# Patient Record
Sex: Male | Born: 1960 | Race: White | Hispanic: No | Marital: Single | Smoking: Former smoker
Health system: Southern US, Community
[De-identification: ages and names within clinical notes are randomized; demographics above are authoritative.]

## PROBLEM LIST (undated history)

## (undated) DIAGNOSIS — F32A Depression, unspecified: Secondary | ICD-10-CM

## (undated) DIAGNOSIS — K922 Gastrointestinal hemorrhage, unspecified: Secondary | ICD-10-CM

## (undated) DIAGNOSIS — Z9114 Patient's other noncompliance with medication regimen: Secondary | ICD-10-CM

## (undated) DIAGNOSIS — Z860100 Personal history of colon polyps, unspecified: Secondary | ICD-10-CM

## (undated) DIAGNOSIS — F319 Bipolar disorder, unspecified: Secondary | ICD-10-CM

## (undated) DIAGNOSIS — E871 Hypo-osmolality and hyponatremia: Secondary | ICD-10-CM

## (undated) DIAGNOSIS — K219 Gastro-esophageal reflux disease without esophagitis: Secondary | ICD-10-CM

## (undated) DIAGNOSIS — K2970 Gastritis, unspecified, without bleeding: Secondary | ICD-10-CM

## (undated) DIAGNOSIS — E039 Hypothyroidism, unspecified: Secondary | ICD-10-CM

## (undated) DIAGNOSIS — J45909 Unspecified asthma, uncomplicated: Secondary | ICD-10-CM

## (undated) DIAGNOSIS — Z8612 Personal history of poliomyelitis: Secondary | ICD-10-CM

## (undated) DIAGNOSIS — E785 Hyperlipidemia, unspecified: Secondary | ICD-10-CM

## (undated) DIAGNOSIS — F209 Schizophrenia, unspecified: Secondary | ICD-10-CM

## (undated) DIAGNOSIS — I959 Hypotension, unspecified: Secondary | ICD-10-CM

## (undated) DIAGNOSIS — I10 Essential (primary) hypertension: Secondary | ICD-10-CM

## (undated) DIAGNOSIS — K589 Irritable bowel syndrome without diarrhea: Secondary | ICD-10-CM

## (undated) DIAGNOSIS — N179 Acute kidney failure, unspecified: Secondary | ICD-10-CM

## (undated) DIAGNOSIS — F329 Major depressive disorder, single episode, unspecified: Secondary | ICD-10-CM

## (undated) DIAGNOSIS — E876 Hypokalemia: Secondary | ICD-10-CM

## (undated) DIAGNOSIS — J449 Chronic obstructive pulmonary disease, unspecified: Secondary | ICD-10-CM

## (undated) HISTORY — PX: BACK SURGERY: SHX140

## (undated) HISTORY — DX: Personal history of colon polyps, unspecified: Z86.0100

## (undated) HISTORY — DX: Personal history of colonic polyps: Z86.010

## (undated) HISTORY — PX: CARPAL TUNNEL RELEASE: SHX101

## (undated) HISTORY — PX: ROTATOR CUFF REPAIR: SHX139

## (undated) HISTORY — PX: CHOLECYSTECTOMY: SHX55

---

## 2014-06-03 ENCOUNTER — Emergency Department: Payer: Self-pay | Admitting: Emergency Medicine

## 2014-08-11 ENCOUNTER — Emergency Department: Admit: 2014-08-11 | Disposition: A | Discharge status: Home / Self Care | Payer: Self-pay | Admitting: Student

## 2014-09-16 ENCOUNTER — Emergency Department
Admission: EM | Admit: 2014-09-16 | Discharge: 2014-09-17 | Disposition: A | Discharge status: Home / Self Care | Payer: Medicaid Other | Attending: Student | Admitting: Student

## 2014-09-16 ENCOUNTER — Encounter: Payer: Self-pay | Admitting: General Practice

## 2014-09-16 DIAGNOSIS — Z720 Tobacco use: Secondary | ICD-10-CM | POA: Diagnosis not present

## 2014-09-16 DIAGNOSIS — Z91148 Patient's other noncompliance with medication regimen for other reason: Secondary | ICD-10-CM

## 2014-09-16 DIAGNOSIS — I10 Essential (primary) hypertension: Secondary | ICD-10-CM | POA: Diagnosis not present

## 2014-09-16 DIAGNOSIS — Z9114 Patient's other noncompliance with medication regimen: Secondary | ICD-10-CM

## 2014-09-16 DIAGNOSIS — J45909 Unspecified asthma, uncomplicated: Secondary | ICD-10-CM

## 2014-09-16 DIAGNOSIS — F329 Major depressive disorder, single episode, unspecified: Secondary | ICD-10-CM | POA: Diagnosis present

## 2014-09-16 DIAGNOSIS — G2401 Drug induced subacute dyskinesia: Secondary | ICD-10-CM

## 2014-09-16 DIAGNOSIS — F23 Brief psychotic disorder: Secondary | ICD-10-CM | POA: Insufficient documentation

## 2014-09-16 DIAGNOSIS — Z79899 Other long term (current) drug therapy: Secondary | ICD-10-CM | POA: Insufficient documentation

## 2014-09-16 HISTORY — DX: Hypothyroidism, unspecified: E03.9

## 2014-09-16 HISTORY — DX: Essential (primary) hypertension: I10

## 2014-09-16 HISTORY — DX: Depression, unspecified: F32.A

## 2014-09-16 HISTORY — DX: Major depressive disorder, single episode, unspecified: F32.9

## 2014-09-16 HISTORY — DX: Chronic obstructive pulmonary disease, unspecified: J44.9

## 2014-09-16 HISTORY — DX: Patient's other noncompliance with medication regimen: Z91.14

## 2014-09-16 HISTORY — DX: Patient's other noncompliance with medication regimen for other reason: Z91.148

## 2014-09-16 LAB — URINALYSIS COMPLETE WITH MICROSCOPIC (ARMC ONLY)
BILIRUBIN URINE: NEGATIVE
GLUCOSE, UA: NEGATIVE mg/dL
Hgb urine dipstick: NEGATIVE
LEUKOCYTES UA: NEGATIVE
NITRITE: NEGATIVE
PH: 5 (ref 5.0–8.0)
Protein, ur: 30 mg/dL — AB
Specific Gravity, Urine: 1.024 (ref 1.005–1.030)

## 2014-09-16 LAB — URINE DRUG SCREEN, QUALITATIVE (ARMC ONLY)
Amphetamines, Ur Screen: NOT DETECTED
BARBITURATES, UR SCREEN: NOT DETECTED
Benzodiazepine, Ur Scrn: POSITIVE — AB
Cannabinoid 50 Ng, Ur ~~LOC~~: POSITIVE — AB
Cocaine Metabolite,Ur ~~LOC~~: NOT DETECTED
MDMA (Ecstasy)Ur Screen: NOT DETECTED
Methadone Scn, Ur: NOT DETECTED
OPIATE, UR SCREEN: NOT DETECTED
Phencyclidine (PCP) Ur S: NOT DETECTED
Tricyclic, Ur Screen: POSITIVE — AB

## 2014-09-16 LAB — CBC
HCT: 48 % (ref 40.0–52.0)
Hemoglobin: 15.9 g/dL (ref 13.0–18.0)
MCH: 30.6 pg (ref 26.0–34.0)
MCHC: 33 g/dL (ref 32.0–36.0)
MCV: 92.6 fL (ref 80.0–100.0)
Platelets: 239 10*3/uL (ref 150–440)
RBC: 5.18 MIL/uL (ref 4.40–5.90)
RDW: 14.3 % (ref 11.5–14.5)
WBC: 13.2 10*3/uL — AB (ref 3.8–10.6)

## 2014-09-16 LAB — COMPREHENSIVE METABOLIC PANEL
ALBUMIN: 4.6 g/dL (ref 3.5–5.0)
ALT: 33 U/L (ref 17–63)
ANION GAP: 10 (ref 5–15)
AST: 43 U/L — ABNORMAL HIGH (ref 15–41)
Alkaline Phosphatase: 97 U/L (ref 38–126)
BUN: 23 mg/dL — ABNORMAL HIGH (ref 6–20)
CO2: 24 mmol/L (ref 22–32)
CREATININE: 1.11 mg/dL (ref 0.61–1.24)
Calcium: 9.7 mg/dL (ref 8.9–10.3)
Chloride: 105 mmol/L (ref 101–111)
GFR calc Af Amer: >60 mL/min (ref >60)
GFR calc non Af Amer: >60 mL/min (ref >60)
GLUCOSE: 109 mg/dL — AB (ref 65–99)
POTASSIUM: 3.9 mmol/L (ref 3.5–5.1)
SODIUM: 139 mmol/L (ref 135–145)
Total Bilirubin: 0.5 mg/dL (ref 0.3–1.2)
Total Protein: 8.1 g/dL (ref 6.5–8.1)

## 2014-09-16 LAB — ACETAMINOPHEN LEVEL

## 2014-09-16 LAB — SALICYLATE LEVEL

## 2014-09-16 LAB — ETHANOL: Alcohol, Ethyl (B): <5 mg/dL (ref <5)

## 2014-09-16 MED ORDER — QUETIAPINE FUMARATE 200 MG PO TABS
ORAL_TABLET | ORAL | Status: AC
  Filled 2014-09-16: qty 2

## 2014-09-16 MED ORDER — LEVOTHYROXINE SODIUM 75 MCG PO TABS
75.0000 ug | ORAL_TABLET | Freq: Every day | ORAL | Status: DC
  Filled 2014-09-16 (×2): qty 1

## 2014-09-16 MED ORDER — ALBUTEROL SULFATE (2.5 MG/3ML) 0.083% IN NEBU: 2.5000 mg | INHALATION_SOLUTION | RESPIRATORY_TRACT | Status: DC | PRN

## 2014-09-16 MED ORDER — LISINOPRIL 20 MG PO TABS
30.0000 mg | ORAL_TABLET | Freq: Every day | ORAL | Status: DC
  Administered 2014-09-16: 30 mg via ORAL

## 2014-09-16 MED ORDER — ALBUTEROL SULFATE HFA 108 (90 BASE) MCG/ACT IN AERS: 2.0000 | INHALATION_SPRAY | RESPIRATORY_TRACT | Status: DC | PRN

## 2014-09-16 MED ORDER — HYOSCYAMINE SULFATE 0.125 MG PO TABS
0.1250 mg | ORAL_TABLET | Freq: Four times a day (QID) | ORAL | Status: DC | PRN
  Filled 2014-09-16: qty 1

## 2014-09-16 MED ORDER — PANTOPRAZOLE SODIUM 40 MG PO TBEC
40.0000 mg | DELAYED_RELEASE_TABLET | Freq: Every day | ORAL | Status: DC
  Administered 2014-09-16: 40 mg via ORAL

## 2014-09-16 MED ORDER — ZIPRASIDONE MESYLATE 20 MG IM SOLR
INTRAMUSCULAR | Status: AC
  Administered 2014-09-16: 20 mg via INTRAMUSCULAR
  Filled 2014-09-16: qty 20

## 2014-09-16 MED ORDER — ZOLPIDEM TARTRATE 5 MG PO TABS: 5.0000 mg | ORAL_TABLET | Freq: Every evening | ORAL | Status: DC | PRN

## 2014-09-16 MED ORDER — AMITRIPTYLINE HCL 50 MG PO TABS
50.0000 mg | ORAL_TABLET | Freq: Every day | ORAL | Status: DC
  Administered 2014-09-16: 50 mg via ORAL
  Filled 2014-09-16 (×2): qty 1

## 2014-09-16 MED ORDER — LORAZEPAM 2 MG/ML IJ SOLN
2.0000 mg | Freq: Once | INTRAMUSCULAR | Status: AC
  Administered 2014-09-16: 2 mg via INTRAMUSCULAR

## 2014-09-16 MED ORDER — LORAZEPAM 2 MG/ML IJ SOLN
INTRAMUSCULAR | Status: AC
  Administered 2014-09-16: 2 mg via INTRAMUSCULAR
  Filled 2014-09-16: qty 1

## 2014-09-16 MED ORDER — DOCUSATE SODIUM 100 MG PO CAPS
ORAL_CAPSULE | ORAL | Status: AC
  Administered 2014-09-16: 100 mg via ORAL
  Filled 2014-09-16: qty 1

## 2014-09-16 MED ORDER — ZIPRASIDONE MESYLATE 20 MG IM SOLR
20.0000 mg | Freq: Once | INTRAMUSCULAR | Status: AC
  Administered 2014-09-16: 20 mg via INTRAMUSCULAR

## 2014-09-16 MED ORDER — QUETIAPINE FUMARATE 25 MG PO TABS
300.0000 mg | ORAL_TABLET | Freq: Every day | ORAL | Status: DC
  Administered 2014-09-16: 300 mg via ORAL

## 2014-09-16 MED ORDER — PANTOPRAZOLE SODIUM 20 MG PO TBEC: 20.0000 mg | DELAYED_RELEASE_TABLET | Freq: Two times a day (BID) | ORAL | Status: DC

## 2014-09-16 MED ORDER — DOCUSATE SODIUM 100 MG PO CAPS
100.0000 mg | ORAL_CAPSULE | Freq: Two times a day (BID) | ORAL | Status: DC
  Administered 2014-09-16: 100 mg via ORAL

## 2014-09-16 MED ORDER — VENLAFAXINE HCL 25 MG PO TABS
50.0000 mg | ORAL_TABLET | Freq: Two times a day (BID) | ORAL | Status: DC
  Administered 2014-09-16: 50 mg via ORAL
  Filled 2014-09-16 (×4): qty 2

## 2014-09-16 MED ORDER — LISINOPRIL 20 MG PO TABS
ORAL_TABLET | ORAL | Status: AC
  Administered 2014-09-16: 30 mg via ORAL
  Filled 2014-09-16: qty 2

## 2014-09-16 MED ORDER — PANTOPRAZOLE SODIUM 40 MG PO TBEC
DELAYED_RELEASE_TABLET | ORAL | Status: AC
  Administered 2014-09-16: 40 mg via ORAL
  Filled 2014-09-16: qty 1

## 2014-09-16 NOTE — ED Notes (Signed)
Dietary notified about food tray needed for pt.

## 2014-09-16 NOTE — ED Notes (Signed)
BEHAVIORAL HEALTH ROUNDING Patient sleeping: Yes.   Patient alert and oriented: Pt sleeping. Behavior appropriate: Yes.  ; If no, describe:  Nutrition and fluids offered: Yes  Toileting and hygiene offered: Yes  Sitter present: not applicable Law enforcement present: Yes

## 2014-09-16 NOTE — ED Notes (Signed)
Pharm. Notified about medications needed for pt.

## 2014-09-16 NOTE — ED Notes (Signed)
BEHAVIORAL HEALTH ROUNDING Patient sleeping: No. Patient alert and oriented: no Behavior appropriate: No.; If no, describe:  Nutrition and fluids offered: Yes  Toileting and hygiene offered: Yes  Sitter present: yes Law enforcement present: Yes [employer] Garment/textile technologist

## 2014-09-16 NOTE — ED Notes (Signed)
BEHAVIORAL HEALTH ROUNDING Patient sleeping: Yes.   Patient alert and oriented: yes Behavior appropriate: Yes.  ; If no, describe:  Nutrition and fluids offered: Yes  Toileting and hygiene offered: Yes  Sitter present: yes Law enforcement present: Yes [employer] Garment/textile technologist

## 2014-09-16 NOTE — ED Notes (Signed)
BEHAVIORAL HEALTH ROUNDING Patient sleeping: Yes.   Patient alert and oriented: yes Behavior appropriate: Yes.  ; If no, describe:  Nutrition and fluids offered: Yes  Toileting and hygiene offered: Yes  Sitter present: no Law enforcement present: Yes  

## 2014-09-16 NOTE — ED Notes (Signed)
ENVIRONMENTAL ASSESSMENT Potentially harmful objects out of patient reach: Yes.   Personal belongings secured: Yes.   Patient dressed in hospital provided attire only: Yes.   Plastic bags out of patient reach: Yes.   Patient care equipment (cords, cables, call bells, lines, and drains) shortened, removed, or accounted for: Yes.   Equipment and supplies removed from bottom of stretcher: Yes.   Potentially toxic materials out of patient reach: Yes.   Sharps container removed or out of patient reach: Yes.  ODS officer

## 2014-09-16 NOTE — ED Notes (Signed)
BEHAVIORAL HEALTH ROUNDING Patient sleeping: No. Patient alert and oriented: yes Behavior appropriate: Yes.  ; If no, describe:  Nutrition and fluids offered: Yes  Toileting and hygiene offered: Yes  Sitter present: yes Law enforcement present: Yes [employer] security 

## 2014-09-16 NOTE — ED Notes (Signed)

## 2014-09-16 NOTE — ED Notes (Signed)
BEHAVIORAL HEALTH ROUNDING Patient sleeping: Yes  Patient alert and oriented: Pt sleeping  Behavior appropriate: Pt sleeping  Nutrition and fluids offered Pt sleeping  Toileting and hygiene offered: Pt sleeping  Sitter present: 15 min checks  Law enforcement present: Yes

## 2014-09-16 NOTE — ED Notes (Signed)
Patient assigned to appropriate care area. Patient oriented to unit/care area: Informed that, for their safety, care areas are designed for safety and monitored by security cameras at all times; and visiting hours explained to patient. Patient verbalizes understanding, and verbal contract for safety obtained. 
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
## 2014-09-16 NOTE — ED Notes (Signed)
MD at bedside, DR. Clapacs

## 2014-09-16 NOTE — Consult Note (Signed)
  Due to overcrowding in the ER patient is reconsidered for admission to [facility]. PAtient's baseline is ambulatory and as of my last talk with him he was not threatening to anyone but would only get upset at the idea of returning to his group home. PSychotic and had been off medication but now cooperating with meds. Orders done.

## 2014-09-16 NOTE — ED Notes (Signed)
BEHAVIORAL HEALTH ROUNDING Patient sleeping: No. Patient alert and oriented: yes Behavior appropriate: No.; If no, describe: Pt in hallway in recliner speaking loudly, yelling he will not return to assisted living.  Nutrition and fluids offered: Yes  Toileting and hygiene offered: Yes  Sitter present: no Law enforcement present: Yes

## 2014-09-16 NOTE — ED Notes (Signed)
Pt. Arrived to ed via [employer]. Reports that pt was brought from his group home, [facility] Reports pt became agitated at group. Pt verbalized depression in triage. Denies SI and HI. PT reports "I just want my freedom back, i want my own home and I don' t want to live there anymore".  Pt alert and oriented.  Pt verbalized that he has not been taking his medications daily.

## 2014-09-16 NOTE — ED Notes (Signed)
BEHAVIORAL HEALTH ROUNDING Patient sleeping: Yes.   Patient alert and oriented: yes Behavior appropriate: Yes.  ; If no, describe:  Nutrition and fluids offered: Yes  Toileting and hygiene offered: Yes  Sitter present: yes Law enforcement present: Yes [employer] security 

## 2014-09-16 NOTE — ED Notes (Signed)
Pt increasingly loud, aggressive and angry yelling about not wanting to return to assisted living. Attempts to calm patient made. Moved into room for comfort. Pt continued loud behavior. Dr Reita Cliche aware.

## 2014-09-16 NOTE — ED Notes (Signed)
BEHAVIORAL HEALTH ROUNDING Patient sleeping: Yes.   Patient alert and oriented: not applicable Behavior appropriate: Yes.   Nutrition and fluids offered: pt sleeping  Toileting and hygiene offered: pt sleeping  Sitter present: 15 min checks  Law enforcement present: Yes

## 2014-09-16 NOTE — ED Provider Notes (Addendum)
[facility] Emergency Department Provider Note   ____________________________________________  Time seen: 9:50 AM I have reviewed the triage vital signs and the triage nursing note.  HISTORY  Chief Complaint Depression   Historian Limited as patient appears to be in acute schizophrenic and depressed state. History is per the patient, and voluntary commitment paperwork, and care home staff  HPI Christopher Mason is a 54 y.o. male who was brought in under involuntary commitment with the petitioner stating that he's been refusing his psychiatric medications for schizophrenia and been having behavioral changes including severe agitation including aggressive behavior with yelling and spitting at staff. The patient himself states that he is "done with assisted living" and is unable to provide any additional reasons as to why he is so agitated. He has not appeared to be hallucinating although he is perseverating and severely agitated. Patient reports "I want my Freedom back and live with my dog ". Symptoms are moderate. Patient is not any pain. Does not report any recent illnesses.    Past Medical History  Diagnosis Date  . Depression   . Hypertension   . Hypothyroid   . COPD (chronic obstructive pulmonary disease)     There are no active problems to display for this patient.   Past Surgical History  Procedure Laterality Date  . Back surgery      Current Outpatient Rx  Name  Route  Sig  Dispense  Refill  . albuterol (PROVENTIL HFA;VENTOLIN HFA) 108 (90 BASE) MCG/ACT inhaler   Inhalation   Inhale 2 puffs into the lungs every 4 (four) hours as needed for wheezing or shortness of breath.         Marland Kitchen amitriptyline (ELAVIL) 50 MG tablet   Oral   Take 50 mg by mouth at bedtime.         . docusate sodium (COLACE) 100 MG capsule   Oral   Take 100 mg by mouth daily.         . hydrOXYzine (VISTARIL) 25 MG capsule   Oral   Take 25 mg by mouth every 4 (four)  hours as needed for anxiety.         . hyoscyamine (LEVSIN, ANASPAZ) 0.125 MG tablet   Oral   Take 0.125 mg by mouth every 4 (four) hours as needed for bladder spasms.         Marland Kitchen ibuprofen (ADVIL,MOTRIN) 800 MG tablet   Oral   Take 800 mg by mouth every 4 (four) hours as needed for mild pain.         Marland Kitchen levothyroxine (SYNTHROID, LEVOTHROID) 75 MCG tablet   Oral   Take 75 mcg by mouth daily.         Marland Kitchen lisinopril (PRINIVIL,ZESTRIL) 30 MG tablet   Oral   Take 30 mg by mouth daily.         Marland Kitchen omeprazole (PRILOSEC) 20 MG capsule   Oral   Take 20 mg by mouth daily.         . pravastatin (PRAVACHOL) 10 MG tablet   Oral   Take 10 mg by mouth at bedtime.         Marland Kitchen QUEtiapine (SEROQUEL) 300 MG tablet   Oral   Take 300 mg by mouth at bedtime.         Marland Kitchen venlafaxine (EFFEXOR) 50 MG tablet   Oral   Take 50 mg by mouth 2 (two) times daily.         Marland Kitchen zolpidem (  AMBIEN) 5 MG tablet   Oral   Take 5 mg by mouth at bedtime.           Allergies Aspirin  No family history on file.  Social History History  Substance Use Topics  . Smoking status: Current Every Day Smoker -- 1.00 packs/day  . Smokeless tobacco: Never Used  . Alcohol Use: No    Review of Systems Limited due to patient's agitated/psychiatric acute state Constitutional: Negative for fever. Eyes: Negative for visual changes. ENT: Negative for sore throat. Cardiovascular: Negative for chest pain. Respiratory: Negative for shortness of breath. Gastrointestinal: Negative for abdominal pain, vomiting and diarrhea. Genitourinary: Negative for dysuria. Musculoskeletal: Negative for back pain. Skin: Negative for rash. Neurological: Negative for headaches, focal weakness or numbness.  ____________________________________________   PHYSICAL EXAM:  VITAL SIGNS: ED Triage Vitals  Enc Vitals Group     BP 09/16/14 0903 141/102 mmHg     Pulse Rate 09/16/14 0903 41     Resp 09/16/14 0903 18     Temp  09/16/14 0903 97.6 F (36.4 C)     Temp src --      SpO2 09/16/14 0903 96 %     Weight 09/16/14 0903 150 lb (68.04 kg)     Height 09/16/14 0903 5\' 7"  (1.702 m)     Head Cir --      Peak Flow --      Pain Score 09/16/14 0904 9     Pain Loc --      Pain Edu? --      Excl. in [place]? --      Constitutional: Alert, agitated but somewhat redirectable. Disheveled but otherwise in no acute distress. Eyes: Conjunctivae are normal. PERRL. Normal extraocular movements. ENT   Head: Normocephalic and atraumatic.   Nose: No congestion/rhinnorhea.   Mouth/Throat: Mucous membranes are moist.   Neck: No stridor. Cardiovascular: Normal rate, regular rhythm.  No murmurs, rubs, or gallops. Respiratory: Normal respiratory effort without tachypnea nor retractions. Breath sounds are clear and equal bilaterally. No wheezes/rales/rhonchi. Gastrointestinal: Soft and nontender. No distention.  Genitourinary: Musculoskeletal: Nontender with normal range of motion in all extremities. No joint effusions.  No lower extremity tenderness nor edema. Neurologic:  Normal speech and language. No gross focal neurologic deficits are appreciated. Skin:  Skin is warm, dry and intact. No rash noted. Psychiatric: Patient reports depressed mood, he is agitated and is perseverating over leaving the assisted living. Stating "if I go back I will keep running away ". There is no suicidal or homicidal ideation. Poor insight or judgment  ____________________________________________   EKG   ____________________________________________  LABS (pertinent positives/negatives)  Tylenol level negative CBC showing a white blood cell count 33.8 Complete metabolic panel without significant abnormality Alcohol negative Salicylate negative  UA and UDS are pending  ____________________________________________  RADIOLOGY Radiologist results  reviewed   __________________________________________  PROCEDURES  Procedure(s) performed: None Critical Care performed: None  ____________________________________________   ED COURSE / ASSESSMENT AND PLAN  Pertinent labs & imaging results that were available during my care of the patient were reviewed by me and considered in my medical decision making (see chart for details).  I will continue the involuntary commitment so the patient can be monitored due to his history of schizophrenia, acute agitated state, and history of being off of his medications for several days with aggressive behavior towards staff at the living facility. At the present he is stopping some depression but no active suicidal ideation. He is redirectable  but does become quite loud and agitated at times. There is no report or clinical findings to suggest any traumatic injury, or any specific other medical illness including to his current mental state.  Patient became increasingly agitated here in the emergency department yelling, threatening to run, and posturing. The patient was moved into a choir room and this continued. Patient was given intramuscular Geodon and Ativan.  TTS and psychiatry consulted for disposition.   ___________________________________________   FINAL CLINICAL IMPRESSION(S) / ED DIAGNOSES   Final diagnoses:  Schizophrenia, acute      Lisa Roca, MD 09/16/14 1145  Lisa Roca, MD 09/16/14 1224

## 2014-09-17 ENCOUNTER — Inpatient Hospital Stay
Admission: EM | Admit: 2014-09-17 | Discharge: 2014-09-29 | DRG: 885 | Disposition: A | Discharge status: Home / Self Care | Payer: Medicaid Other | Source: Ambulatory Visit | Attending: Psychiatry | Admitting: Psychiatry

## 2014-09-17 DIAGNOSIS — Z888 Allergy status to other drugs, medicaments and biological substances status: Secondary | ICD-10-CM | POA: Diagnosis not present

## 2014-09-17 DIAGNOSIS — G4700 Insomnia, unspecified: Secondary | ICD-10-CM | POA: Diagnosis present

## 2014-09-17 DIAGNOSIS — Z79899 Other long term (current) drug therapy: Secondary | ICD-10-CM | POA: Diagnosis not present

## 2014-09-17 DIAGNOSIS — J45909 Unspecified asthma, uncomplicated: Secondary | ICD-10-CM | POA: Diagnosis present

## 2014-09-17 DIAGNOSIS — K219 Gastro-esophageal reflux disease without esophagitis: Secondary | ICD-10-CM | POA: Diagnosis present

## 2014-09-17 DIAGNOSIS — Z9114 Patient's other noncompliance with medication regimen: Secondary | ICD-10-CM | POA: Diagnosis present

## 2014-09-17 DIAGNOSIS — G2401 Drug induced subacute dyskinesia: Secondary | ICD-10-CM | POA: Diagnosis present

## 2014-09-17 DIAGNOSIS — F17210 Nicotine dependence, cigarettes, uncomplicated: Secondary | ICD-10-CM | POA: Diagnosis present

## 2014-09-17 DIAGNOSIS — J449 Chronic obstructive pulmonary disease, unspecified: Secondary | ICD-10-CM | POA: Diagnosis present

## 2014-09-17 DIAGNOSIS — Z7951 Long term (current) use of inhaled steroids: Secondary | ICD-10-CM | POA: Diagnosis not present

## 2014-09-17 DIAGNOSIS — K5900 Constipation, unspecified: Secondary | ICD-10-CM | POA: Diagnosis present

## 2014-09-17 DIAGNOSIS — F329 Major depressive disorder, single episode, unspecified: Secondary | ICD-10-CM | POA: Diagnosis present

## 2014-09-17 DIAGNOSIS — I10 Essential (primary) hypertension: Secondary | ICD-10-CM | POA: Diagnosis present

## 2014-09-17 DIAGNOSIS — F1220 Cannabis dependence, uncomplicated: Secondary | ICD-10-CM | POA: Diagnosis present

## 2014-09-17 DIAGNOSIS — Z9889 Other specified postprocedural states: Secondary | ICD-10-CM

## 2014-09-17 DIAGNOSIS — F209 Schizophrenia, unspecified: Principal | ICD-10-CM | POA: Diagnosis present

## 2014-09-17 DIAGNOSIS — R45851 Suicidal ideations: Secondary | ICD-10-CM | POA: Diagnosis present

## 2014-09-17 DIAGNOSIS — F1210 Cannabis abuse, uncomplicated: Secondary | ICD-10-CM | POA: Diagnosis present

## 2014-09-17 DIAGNOSIS — E039 Hypothyroidism, unspecified: Secondary | ICD-10-CM | POA: Diagnosis present

## 2014-09-17 DIAGNOSIS — F17200 Nicotine dependence, unspecified, uncomplicated: Secondary | ICD-10-CM | POA: Diagnosis present

## 2014-09-17 MED ORDER — OLANZAPINE 10 MG PO TABS
10.0000 mg | ORAL_TABLET | Freq: Every day | ORAL | Status: DC
  Administered 2014-09-17: 10 mg via ORAL
  Filled 2014-09-17: qty 1

## 2014-09-17 MED ORDER — PANTOPRAZOLE SODIUM 40 MG PO TBEC
40.0000 mg | DELAYED_RELEASE_TABLET | Freq: Every day | ORAL | Status: DC
  Administered 2014-09-17 – 2014-09-29 (×13): 40 mg via ORAL
  Filled 2014-09-17 (×13): qty 1

## 2014-09-17 MED ORDER — LEVOTHYROXINE SODIUM 75 MCG PO TABS
75.0000 ug | ORAL_TABLET | Freq: Every day | ORAL | Status: DC
  Administered 2014-09-17 – 2014-09-29 (×13): 75 ug via ORAL
  Filled 2014-09-17 (×15): qty 1

## 2014-09-17 MED ORDER — ZOLPIDEM TARTRATE 5 MG PO TABS
5.0000 mg | ORAL_TABLET | Freq: Every evening | ORAL | Status: DC | PRN
  Administered 2014-09-17: 5 mg via ORAL
  Filled 2014-09-17: qty 1

## 2014-09-17 MED ORDER — MAGNESIUM HYDROXIDE 400 MG/5ML PO SUSP
30.0000 mL | Freq: Every day | ORAL | Status: DC | PRN
Start: 2014-09-17 — End: 2014-09-29

## 2014-09-17 MED ORDER — ALBUTEROL SULFATE (2.5 MG/3ML) 0.083% IN NEBU: 2.5000 mg | INHALATION_SOLUTION | RESPIRATORY_TRACT | Status: DC | PRN

## 2014-09-17 MED ORDER — HYOSCYAMINE SULFATE 0.125 MG PO TABS: 0.1250 mg | ORAL_TABLET | Freq: Four times a day (QID) | ORAL | Status: DC | PRN

## 2014-09-17 MED ORDER — DOCUSATE SODIUM 100 MG PO CAPS
100.0000 mg | ORAL_CAPSULE | Freq: Two times a day (BID) | ORAL | Status: DC
Start: 2014-09-17 — End: 2014-09-29
  Administered 2014-09-17 – 2014-09-29 (×25): 100 mg via ORAL
  Filled 2014-09-17 (×25): qty 1

## 2014-09-17 MED ORDER — ALUM & MAG HYDROXIDE-SIMETH 200-200-20 MG/5ML PO SUSP
30.0000 mL | ORAL | Status: DC | PRN
  Administered 2014-09-20 – 2014-09-23 (×2): 30 mL via ORAL
  Filled 2014-09-17 (×2): qty 30

## 2014-09-17 MED ORDER — NICOTINE 10 MG IN INHA
1.0000 | RESPIRATORY_TRACT | Status: DC | PRN
  Administered 2014-09-17: 1 via RESPIRATORY_TRACT
  Filled 2014-09-17: qty 36

## 2014-09-17 MED ORDER — QUETIAPINE FUMARATE 25 MG PO TABS: 25.0000 mg | ORAL_TABLET | ORAL | Status: DC | PRN

## 2014-09-17 MED ORDER — VENLAFAXINE HCL 50 MG PO TABS
50.0000 mg | ORAL_TABLET | Freq: Two times a day (BID) | ORAL | Status: DC
  Administered 2014-09-17 – 2014-09-18 (×3): 50 mg via ORAL
  Filled 2014-09-17 (×5): qty 1

## 2014-09-17 MED ORDER — ACETAMINOPHEN 325 MG PO TABS
650.0000 mg | ORAL_TABLET | Freq: Four times a day (QID) | ORAL | Status: DC | PRN
  Administered 2014-09-22 – 2014-09-26 (×2): 650 mg via ORAL
  Filled 2014-09-17 (×2): qty 2

## 2014-09-17 MED ORDER — LISINOPRIL 20 MG PO TABS
30.0000 mg | ORAL_TABLET | Freq: Every day | ORAL | Status: DC
  Administered 2014-09-17 – 2014-09-27 (×11): 30 mg via ORAL
  Filled 2014-09-17 (×12): qty 1

## 2014-09-17 MED ORDER — AMITRIPTYLINE HCL 25 MG PO TABS
50.0000 mg | ORAL_TABLET | Freq: Every day | ORAL | Status: DC
Start: 2014-09-17 — End: 2014-09-29
  Administered 2014-09-17 – 2014-09-28 (×12): 50 mg via ORAL
  Filled 2014-09-17 (×12): qty 2

## 2014-09-17 MED ORDER — QUETIAPINE FUMARATE 200 MG PO TABS
300.0000 mg | ORAL_TABLET | Freq: Every day | ORAL | Status: DC
  Administered 2014-09-17 – 2014-09-28 (×12): 300 mg via ORAL
  Filled 2014-09-17 (×13): qty 1

## 2014-09-17 NOTE — BHH Group Notes (Signed)
[facility] Group Notes:  (Nursing/MHT/Case Management/Adjunct)  Date:  09/17/2014  Time:  1:45 PM  Type of Therapy:  Group Therapy  Participation Level:  Active  Participation Quality:  Appropriate  Affect:  Appropriate  Cognitive:  Appropriate  Insight:  Appropriate  Engagement in Group:  Supportive  Modes of Intervention:  Activity  Summary of Progress/Problems:  Christopher Mason 09/17/2014, 1:45 PM

## 2014-09-17 NOTE — BHH Suicide Risk Assessment (Signed)
[facility] Admission Suicide Risk Assessment   Nursing information obtained from:  Patient Demographic factors:  Male, Low socioeconomic status, Unemployed, Caucasian Current Mental Status:  NA Loss Factors:  Decline in physical health, Financial problems / change in socioeconomic status Historical Factors:  Prior suicide attempts Risk Reduction Factors:  Positive therapeutic relationship Total Time spent with patient: 1 hour Principal Problem: Schizophrenia Diagnosis:   Patient Active Problem List   Diagnosis Date Noted  . Hypothyroidism [E03.9] 09/17/2014  . GERD (gastroesophageal reflux disease) [K21.9] 09/17/2014  . Tobacco use disorder [Z72.0] 09/17/2014  . Schizophrenia [F20.9] 09/16/2014  . Non compliance w medication regimen [Z91.14] 09/16/2014  . Asthma [J45.909] 09/16/2014  . HTN (hypertension) [I10] 09/16/2014  . Tardive dyskinesia [G24.01] 09/16/2014     Continued Clinical Symptoms:  Alcohol Use Disorder Identification Test Final Score (AUDIT): 0 The "Alcohol Use Disorders Identification Test", Guidelines for Use in Primary Care, Second Edition.  World Pharmacologist [facility]). Score between 0-7:  no or low risk or alcohol related problems. Score between 8-15:  moderate risk of alcohol related problems. Score between 16-19:  high risk of alcohol related problems. Score 20 or above:  warrants further diagnostic evaluation for alcohol dependence and treatment.   CLINICAL FACTORS:   Schizophrenia:   Paranoid or undifferentiated type   Musculoskeletal: Strength & Muscle Tone: within normal limits Gait & Station: normal Patient leans: N/A  Psychiatric Specialty Exam: Physical Exam  Nursing note and vitals reviewed. Constitutional: He is oriented to person, place, and time. He appears well-developed and well-nourished.  HENT:  Head: Normocephalic and atraumatic.  Eyes: Conjunctivae are normal. Pupils are equal, round, and reactive to light.  Neck: Normal range of  motion. Neck supple.  Cardiovascular: Normal rate, regular rhythm and normal heart sounds.   Respiratory: Effort normal and breath sounds normal.  GI: Soft. Bowel sounds are normal.  Musculoskeletal: Normal range of motion.  Neurological: He is alert and oriented to person, place, and time.  Skin: Skin is warm and dry.    Review of Systems  All other systems reviewed and are negative.   Blood pressure 124/87, pulse 96, temperature 97.6 F (36.4 C), temperature source Oral, resp. rate 20, height 5\' 7"  (1.702 m), weight 68.04 kg (150 lb), SpO2 98 %.Body mass index is 23.49 kg/(m^2).  General Appearance: Disheveled  Eye Contact::  Minimal  Speech:  Slow  Volume:  Increased  Mood:  Anxious  Affect:  Labile  Thought Process:  Disorganized  Orientation:  Full (Time, Place, and Person)  Thought Content:  WDL  Suicidal Thoughts:  No  Homicidal Thoughts:  Yes.  with intent/plan  Memory:  Immediate;   Poor Recent;   Poor Remote;   Poor  Judgement:  Impaired  Insight:  Lacking  Psychomotor Activity:  Decreased  Concentration:  Poor  Recall:  Poor  Fund of Knowledge:Poor  Language: Poor  Akathisia:  No  Handed:  Right  AIMS (if indicated):     Assets:  Financial Resources/Insurance  Sleep:  Number of Hours: 3.5  Cognition: Impaired,  Moderate  ADL's:  Impaired     COGNITIVE FEATURES THAT CONTRIBUTE TO RISK:  None    SUICIDE RISK:   Moderate:  Frequent suicidal ideation with limited intensity, and duration, some specificity in terms of plans, no associated intent, good self-control, limited dysphoria/symptomatology, some risk factors present, and identifiable protective factors, including available and accessible social support.  PLAN OF CARE: Hospital admission, medication management, discharge planning.  Medical Decision Making:  New problem, with additional work up planned, Review of Psycho-Social Stressors (1), Review or order clinical lab tests (1), Review of Medication  Regimen & Side Effects (2) and Review of New Medication or Change in Dosage (2)   Mr. Olazabal is a 54 year old male with a history of severe mental illness most likely schizophrenia who was admitted after an episode of agitated, threatening behavior at the assisted living facility. This was in the context of medication refusal for several days.   1. Agitated behavior. This has resolved. The patient is: cool and collected   2. Schizophrenia. The patient apparently has been maintained on Seroquel 300 mg at bedtime will continue. He was started on Effexor for depression.  3. Hypothyroidism. We'll continue Synthroid.  4. Hypertension. He is taking lisinopril.  5. GERD. We'll continue pantoprazole.  6. COPD. Albuterol inhaler as needed.  7. Smoking. Nicotine products aren't available.  8. Insomnia. He is on Ambien.  9. Constipation. We'll continue stool softener.  10. Social. We do not know if the patient is the guardian of his own. We do not know whether or not he is allowed to return to [facility] assisted [facility] facility.  11. Disposition. To be established. Follow-up. No information at present about his current arrangements    I certify that inpatient services furnished can reasonably be expected to improve the patient's condition.   Orson Slick 09/17/2014, 12:28 PM

## 2014-09-17 NOTE — BHH Group Notes (Signed)
[facility] Group Notes:  (Nursing/MHT/Case Management/Adjunct)  Date:  09/17/2014  Time:  11:24 PM  Type of Therapy:  Group Therapy  Participation Level:  Active  Participation Quality:  Appropriate  Affect:  Labile  Cognitive:  Appropriate  Insight:  Limited  Engagement in Group:  Engaged  Modes of Intervention:  Discussion  Summary of Progress/Problems:  Christopher Mason Christopher Mason 09/17/2014, 11:24 PM

## 2014-09-17 NOTE — ED Notes (Signed)
BEHAVIORAL HEALTH ROUNDING Patient sleeping: Yes.   Patient alert and oriented: yes Behavior appropriate: Yes.  ; If no, describe:  Nutrition and fluids offered: Yes  Toileting and hygiene offered: Yes  Sitter present: yes Law enforcement present: Yes [employer] security 

## 2014-09-17 NOTE — ED Provider Notes (Signed)
-----------------------------------------   1:08 AM on 09/17/2014 -----------------------------------------  Patient has been admitted to the behavioral medicine unit. Currently resting in no acute distress.  Paulette Blanch, MD 09/17/14 [phone]

## 2014-09-17 NOTE — Progress Notes (Signed)
Recreation Therapy Notes  Date: 05.26.16 Time: 3:00 pm Location: Craft Room  Group Topic: Leisure Education  Goal Area(s) Addresses:  Patient will write at least one healthy leisure activity. Patient will write his/her leisure goal for next year.  Behavioral Response: Did not attend  Intervention: Leisure Bucket List  Activity: Patients were given a Leisure Insurance account manager and instructed to fill out as many healthy leisure activities that they wanted to participate in, but had not had the chance to yet. Patients were instructed to list a leisure goal for next year.  Education: LRT educated patient on leisure and what is needed to participate in leisure.  Education Outcome: Patient did not attend group.  Clinical Observations/Feedback: Patient did not attend group.   Leonette Monarch, LRT/CTRS 09/17/2014 4:15 PM
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
## 2014-09-17 NOTE — Tx Team (Signed)
Initial Interdisciplinary Treatment Plan   PATIENT STRESSORS: Medication change or noncompliance   PATIENT STRENGTHS: General fund of knowledge Motivation for treatment/growth   PROBLEM LIST: Problem List/Patient Goals Date to be addressed Date deferred Reason deferred Estimated date of resolution  Depression 09/17/14     Anxiety 09/17/14     Agitation 09/17/14                                          DISCHARGE CRITERIA:  Improved stabilization in mood, thinking, and/or behavior  PRELIMINARY DISCHARGE PLAN: Outpatient therapy  PATIENT/FAMIILY INVOLVEMENT: This treatment plan has been presented to and reviewed with the patient, Christopher Mason, and/or family member.  The patient and family have been given the opportunity to ask questions and make suggestions.  Nash Mantis [facility] 09/17/2014, 1:56 AM

## 2014-09-17 NOTE — [facility] Counselor (Signed)
Received phone call from pt. Conservation officer, nature ([phone]) requesting an update. Writer informed her, the pt has been admitted to the [place] and provided her with information to get into contact with the pt. assigned Education officer, museum.
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
## 2014-09-17 NOTE — Plan of Care (Signed)
Problem: Alteration in mood Goal: LTG-Patient reports reduction in suicidal thoughts (Patient reports reduction in suicidal thoughts and is able to verbalize a safety plan for whenever patient is feeling suicidal)  Outcome: Progressing Denies suicidal thoughts      

## 2014-09-17 NOTE — Progress Notes (Signed)
Patient admitted today IVC for aggressive behavior in his group home and noncompliance with his medication.  Patient is lethargic on admission and needs constant prompting to complete the initial assessment.  Patient became upset when discussing the group home stating, "I don't want to go back."  Patient search performed.  No contraband found.

## 2014-09-17 NOTE — Tx Team (Signed)
Interdisciplinary Treatment Plan Update (Adult)  Date:  09/17/2014 Time Reviewed:  2:38 PM  Progress in Treatment: Attending groups: No. Participating in groups:  No. Taking medication as prescribed:  Yes. Tolerating medication:  Yes. Family/Significant othe contact made:  Yes, individual(s) contacted:  CSW contacted patient's group home and spoke with staff at [facility] [phone] Patient understands diagnosis:  No. Patient reports he has a mental illness but not able to identify his diagnosis. Discussing patient identified problems/goals with staff:  Yes. Medical problems stabilized or resolved:  Yes. Denies suicidal/homicidal ideation: No. Issues/concerns per patient self-inventory:  No. Other:  New problem(s) identified: No, Describe:  none identified  Discharge Plan or Barriers: Patient is awaiting to hear back from [facility] if he can return and needs followup arranged.   Reason for Continuation of Hospitalization: Aggression Medication stabilization  Comments:  Estimated length of stay: up to 7 days  New goal(s):  Review of initial/current patient goals per problem list:   See Care Plan  Attendees: Patient:   5/26/20162:38 PM  Family:   5/26/20162:38 PM  Physician:  Orson Slick, MD 5/26/20162:38 PM  Nursing:    5/26/20162:38 PM  Case Manager:   5/26/20162:38 PM  Counselor:   5/26/20162:38 PM  Other:  Carmell Austria, LCSWA 5/26/20162:38 PM  Other:  Everitt Amber, LRT 5/26/20162:38 PM  Other:   5/26/20162:38 PM  Other:  5/26/20162:38 PM  Other:  5/26/20162:38 PM  Other:  5/26/20162:38 PM  Other:  5/26/20162:38 PM  Other:  5/26/20162:38 PM  Other:  5/26/20162:38 PM  Other:   5/26/20162:38 PM   Scribe for Treatment Team:   Carmell Austria T, 09/17/2014, 2:38 PM

## 2014-09-17 NOTE — Progress Notes (Signed)
Recreation Therapy Notes  INPATIENT RECREATION THERAPY ASSESSMENT  Patient Details Name: Christopher Mason MRN: [id number] DOB: [birth year] Today's Date: 09/17/2014  Patient Stressors: Family, Other (Comment) (Assisted living)  Coping Skills:   Isolate, Substance Abuse, Avoidance, Exercise, Music, Sports (Patient reported he smokes marijuana every once in a while)  Personal Challenges: Anger, Communication, Concentration, Expressing Yourself, Problem-Solving, Relationships, Self-Esteem/Confidence, Social Interaction, Stress Management, Substance Abuse, Trusting Others  Leisure Interests (2+):  [employer], Individual - Other (Comment) (Going to the country club)  Futures trader Resources:  No  Community Resources:     Current Use:    If no, Barriers?:    Patient Strengths:  "My dog"  Patient Identified Areas of Improvement:  To get out of the assisted living  Current Recreation Participation:  Going to the country club 4 days a week  Patient Goal for Hospitalization:  To get his own place  [place] of Residence:  [place] of Residence:  [place]   Current SI (including self-harm):   (Sometimes)  Current HI:  No  Consent to Intern Participation: N/A   Leonette Monarch, LRT/CTRS 09/17/2014, 2:34 PM

## 2014-09-17 NOTE — Progress Notes (Addendum)
Patient stated he slept well last night .stated his appetite good, energy level low and concentration poor.Voice of Depression 8 Hopelessness 8, and anxiety 8.scale  (low0-10 high)  Patient demanding that staff find another placement for him. Voice of having a pat at the group home that would go with him. Stated they treat him as a child and not a man, Patient gait unsteady , walking with a walker .

## 2014-09-18 MED ORDER — OLANZAPINE 10 MG PO TABS
20.0000 mg | ORAL_TABLET | Freq: Every day | ORAL | Status: DC
Start: 2014-09-18 — End: 2014-09-20
  Administered 2014-09-18 – 2014-09-19 (×2): 20 mg via ORAL
  Filled 2014-09-18 (×2): qty 2

## 2014-09-18 MED ORDER — VENLAFAXINE HCL 37.5 MG PO TABS
75.0000 mg | ORAL_TABLET | Freq: Two times a day (BID) | ORAL | Status: DC
  Administered 2014-09-18 – 2014-09-20 (×4): 75 mg via ORAL
  Filled 2014-09-18 (×3): qty 2

## 2014-09-18 NOTE — Progress Notes (Signed)
Recreation Therapy Notes  Date: 05.27.16 Time: 3:00 pm Location: Craft Room  Group Topic: Communication, Problem solving, Teamwork  Goal Area(s) Addresses:  Patient will work in teams towards shared goal. Patient will verbalize skills needed to make activity successful. Patient will verbalize benefit of using skills identify to reach post d/c goals.  Behavioral Response: Did not attend  Intervention: Landing Pad  Activity: Patients were given 12 straws and approximately 2.5 feet of tape and instructed to build a lading pad to catch a golf ball from 4 feet.   Education: LRT educated patient on communication, problem solving, and teamwork and why these skills are important.   Education Outcome: Patient did not attend group.  Clinical Observations/Feedback: Patient did not attend group.   Leonette Monarch, LRT/CTRS 09/18/2014 4:20 PM
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
## 2014-09-18 NOTE — BHH Group Notes (Signed)
[facility] LCSW Aftercare Discharge Planning Group Note  09/18/2014 2:40 PM  Participation Quality:  Attentive and Intrusive  Affect:  Angry and Anxious  Cognitive:  Alert  Insight:  Lacking  Engagement in Group:  Distracting, Engaged and Monopolizing  Modes of Intervention:  Limit-setting, Socialization and Support  Summary of Progress/Problems: Patient shared in group that he wanted to get independent housing and does not want to go back to assisted living and refuses to go back to any assisted living and would rather live on the street. Patient also reports he will not go to a homeless shelter. Patient was redirectable but did monopolize at times and was intrusive.   Carmell Austria T 09/18/2014, 2:40 PM

## 2014-09-18 NOTE — BHH Group Notes (Signed)
[facility] Group Notes:  (Nursing/MHT/Case Management/Adjunct)  Date:  09/18/2014  Time:  12:31 PM  Type of Therapy:  Psychoeducational Skills  Participation Level:  Active  Participation Quality:  Attentive  Affect:  Appropriate  Cognitive:  Alert  Insight:  Appropriate  Engagement in Group:  Engaged  Modes of Intervention:  Activity  Summary of Progress/Problems:  Christopher Mason 09/18/2014, 12:31 PM

## 2014-09-18 NOTE — Progress Notes (Signed)
Pt has been pleasant this shift. No negative behaviors. Attends group and is med compliant. Pt continues to voice "I want to go somewhere else to live." Interacting appropriately with staff and peers.  Denies SI, HI, AVH. Pt voices "feeling sad" Ambulates with walker and unsteady gait. Will continue to assess and monitor for safety.
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
## 2014-09-18 NOTE — Plan of Care (Signed)
Problem: Alteration in mood; excessive anxiety as evidenced by: Goal: LTG-Patient's behavior demonstrates decreased anxiety (Patient's behavior demonstrates anxiety and he/she is utilizing learned coping skills to deal with anxiety-producing situations)  Outcome: Progressing Upset about dog and living situation, but allowed for redirection. Goal: STG-Patient can identify triggers for anxiety Outcome: Progressing Upset when thinking about his dog and his group home.

## 2014-09-18 NOTE — Plan of Care (Signed)
Problem: Alteration in mood; excessive anxiety as evidenced by: Goal: STG-Pt can identify coping skills to manage panic/anxiety (Patient can identify at least ____ coping skills to manage panic/anxiety attack)  Outcome: Progressing Pt pleasant and calm  Problem: Ineffective individual coping Goal: STG: Patient will remain free from self harm Outcome: Progressing No  Self harm observed or reported

## 2014-09-18 NOTE — Plan of Care (Signed)
Problem: [facility] Participation in Recreation Therapeutic Interventions Goal: STG-Patient will demonstrate improved self esteem by identif STG: Self-Esteem - Within 8 treatment sessions, patient will verbalize at least 5 positive affirmation statements in each of 5 treatment sessions to increase self-esteem post d/c.  Outcome: Progressing Treatment Session 1; Completed 1 out of 5: At approximately 12:20 pm, LRT met with patient in patient room. Patient verbalized 5 positive affirmation statements. Patient reported it felt "alright". LRT encouraged patient to continue saying positive affirmation statements.  Leonette Monarch, LRT/CTRS 05.27.16 1:45 pm

## 2014-09-19 ENCOUNTER — Encounter: Payer: Self-pay | Admitting: Psychiatry

## 2014-09-19 NOTE — Plan of Care (Signed)
Problem: Ineffective individual coping Goal: LTG: Patient will report a decrease in negative feelings Outcome: Progressing No negative behaviors observed or reported  Problem: Alteration in mood Goal: LTG-Pt's behavior demonstrates decreased signs of depression (Patient's behavior demonstrates decreased signs of depression to the point the patient is safe to return home and continue treatment in an outpatient setting)  Outcome: Progressing Pt talking with staff and peers. Smiling. Reports feeling good

## 2014-09-19 NOTE — Plan of Care (Signed)
Problem: Alteration in mood; excessive anxiety as evidenced by: Goal: LTG-Patient's behavior demonstrates decreased anxiety (Patient's behavior demonstrates anxiety and he/she is utilizing learned coping skills to deal with anxiety-producing situations)  Able to redirect when anxious and has increased time without noted or observed anxiety. Goal: STG-Patient can identify triggers for anxiety Outcome: Progressing States he gets upset when he thinks of his group home and the staff. Goal: STG-Pt will report an absence of self-harm thoughts/actions (Patient will report an absence of self-harm thoughts or actions)  Outcome: Progressing No self harm thoughts/actions.
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
## 2014-09-19 NOTE — Progress Notes (Signed)
Pt denies SI, AVH and depression. No negative behaviors. Med and group compliant. Appropriate with staff and peers. Will continue to assess and monitor for safety.

## 2014-09-20 MED ORDER — VENLAFAXINE HCL ER 75 MG PO CP24
150.0000 mg | ORAL_CAPSULE | Freq: Every day | ORAL | Status: DC
  Administered 2014-09-21: 150 mg via ORAL
  Filled 2014-09-20: qty 2

## 2014-09-20 NOTE — Plan of Care (Signed)
Problem: Ineffective individual coping Goal: STG: Patient will remain free from self harm Outcome: Progressing Pt. Remains free from self-harm attempts.

## 2014-09-20 NOTE — Plan of Care (Signed)
Problem: Alteration in mood; excessive anxiety as evidenced by: Goal: STG-Pt will report an absence of self-harm thoughts/actions (Patient will report an absence of self-harm thoughts or actions)  Outcome: Progressing Denies self-harm thoughts.  Denies depression.

## 2014-09-20 NOTE — Progress Notes (Signed)
Pt. Has been pleasant and cooperative today.  Attended to ADLs.  Taking prescribed meds.  Appetite good.  Has not had any periods of agitation.  Attending groups.

## 2014-09-20 NOTE — Plan of Care (Signed)
Problem: Ineffective individual coping Goal: STG: Patient will remain free from self harm Outcome: Progressing No self harm Goal: STG:Pt. will utilize relaxation techniques to reduce stress STG: Patient will utilize relaxation techniques to reduce stress levels  Outcome: Progressing Patient is allowing for redirection when he becomes angry. He knows to think and talk about his dog Tiny Mac.

## 2014-09-20 NOTE — [facility] Group Notes (Signed)
[facility] Group Notes:  (Nursing/MHT/Case Management/Adjunct)  Date:  09/19/2014  Time:  2030  Type of Therapy:  Group Therapy  Participation Level:  Did Not Attend  Participation Quality:  Did not attend  Affect:  Did not attend  Cognitive:  Did not attend  Insight:  None  Engagement in Group:  Did not attend  Modes of Intervention:  Did not attend  Summary of Progress/Problems:  Rumi Taras L Ayslin Kundert 09/20/2014, 4:19 AM
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
## 2014-09-20 NOTE — [facility] Group Notes (Signed)
[facility] LCSW Group Therapy  09/20/2014 7:42 AM  Type of Therapy:  Group Therapy  Participation Level:  Active  Participation Quality:  Appropriate  Affect:  Appropriate  Cognitive:  Appropriate  Insight:  Engaged  Engagement in Therapy:  Engaged  Modes of Intervention:  Discussion, Education, Exploration and Support  Summary of Progress/Problems:This group topic today was suicide prevention and intervention. This patient was able to relate to peers and follow discussion and offer support and insight. Suicide prevention and intervention hand out was provided.   Enis Slipper M 09/20/2014, 7:42 AM

## 2014-09-20 NOTE — Progress Notes (Signed)
Pt. Denies depression, SI/HI, A/V hallucinations.  Forde Dandy is appropriate.  Pt. Using walker for ambulation.

## 2014-09-20 NOTE — Progress Notes (Signed)
[facility] MD Progress Note  09/20/2014 1:19 PM Christopher Mason  MRN:  [id number] Subjective:   Patient is a 54 year old male with history of schizophrenia who was admitted due to agitation and threatening his peers in the context of noncompliance at his group home. Patient was seen for follow-up. He appeared very sleepy and groggy this morning. Patient reported that he is unable to keep his eyes open due to the medications at this time. He reported that he does not have any suicidal or homicidal ideations or plans. He reported that he does not want to go back to the group home and wants to move to a different place. He reported that he wants to move out with his dog and has to have his own place. He stated that the medications are making him very tired and wants to have his medications adjusted at this time. He currently denied having any perceptual disturbances.He follows at [facility] in [place].   Principal Problem: Schizophrenia Diagnosis:   Patient Active Problem List   Diagnosis Date Noted  . Undifferentiated schizophrenia [F20.3]   . Hypothyroidism [E03.9] 09/17/2014  . GERD (gastroesophageal reflux disease) [K21.9] 09/17/2014  . Tobacco use disorder [Z72.0] 09/17/2014  . Schizophrenia [F20.9] 09/16/2014  . Non compliance w medication regimen [Z91.14] 09/16/2014  . Asthma [J45.909] 09/16/2014  . HTN (hypertension) [I10] 09/16/2014  . Tardive dyskinesia [G24.01] 09/16/2014   Total Time spent with patient: 20 minutes   Past Medical History:  Past Medical History  Diagnosis Date  . Depression   . Hypertension   . Hypothyroid   . COPD (chronic obstructive pulmonary disease)     Past Surgical History  Procedure Laterality Date  . Back surgery     Family History: History reviewed. No pertinent family history. Social History:  History  Alcohol Use No     History  Drug Use  . 1.00 per week  . Special: Marijuana    History   Social History  . Marital Status: Single    Spouse  Name: N/A  . Number of Children: N/A  . Years of Education: N/A   Social History Main Topics  . Smoking status: Current Every Day Smoker -- 1.00 packs/day    Types: Cigarettes  . Smokeless tobacco: Never Used  . Alcohol Use: No  . Drug Use: 1.00 per week    Special: Marijuana  . Sexual Activity: Not on file   Other Topics Concern  . None   Social History Narrative   Additional History:    Sleep: Good  Appetite:  Fair   Assessment:   Musculoskeletal: Strength & Muscle Tone: within normal limits Gait & Station: normal Patient leans: N/A   Psychiatric Specialty Exam: Physical Exam   Review of Systems  Constitutional: Negative for fever.  HENT: Negative for congestion and ear discharge.   Respiratory: Negative for cough.   Cardiovascular: Negative for palpitations.  Gastrointestinal: Negative for diarrhea.  Genitourinary: Negative for dysuria.  Musculoskeletal: Negative for neck pain.  Neurological: Positive for weakness. Negative for speech change.  Endo/Heme/Allergies: Negative for polydipsia.  Psychiatric/Behavioral: Positive for depression. Negative for suicidal ideas. The patient is nervous/anxious. The patient does not have insomnia.     Blood pressure 133/94, pulse 78, temperature 98.5 F (36.9 C), temperature source Oral, resp. rate 20, height 5\' 7"  (1.702 m), weight 68.04 kg (150 lb), SpO2 98 %.Body mass index is 23.49 kg/(m^2).  General Appearance: Casual   Eye Contact::  Fair   Speech:  Slow  Volume:  Decreased  Mood:  Depressed  Affect:  Blunt  Thought Process:  Disorganized  Orientation:  Full (Time, Place, and Person)  Thought Content:  NA  Suicidal Thoughts:  No  Homicidal Thoughts:  No  Memory: good  Judgement:  Intact  Insight:  Fair  Psychomotor Activity:  Psychomotor Retardation   Concentration:  Fair  Recall:  [place] of Knowledge:Fair  Language: Fair  Akathisia:  No  Handed:  Right  AIMS (if indicated):   none  Assets:   Communication Skills  ADL's:  Intact  Cognition: WNL  Sleep:  Number of Hours: 7.75     Current Medications: Current Facility-Administered Medications  Medication Dose Route Frequency Provider Last Rate Last Dose  . acetaminophen (TYLENOL) tablet 650 mg  650 mg Oral Q6H PRN Gonzella Lex, MD      . albuterol (PROVENTIL) (2.5 MG/3ML) 0.083% nebulizer solution 2.5 mg  2.5 mg Nebulization Q4H PRN Gonzella Lex, MD      . alum & mag hydroxide-simeth (MAALOX/MYLANTA) 200-200-20 MG/5ML suspension 30 mL  30 mL Oral Q4H PRN Gonzella Lex, MD   30 mL at 09/20/14 1239  . amitriptyline (ELAVIL) tablet 50 mg  50 mg Oral QHS Gonzella Lex, MD   50 mg at 09/19/14 2116  . docusate sodium (COLACE) capsule 100 mg  100 mg Oral BID Gonzella Lex, MD   100 mg at 09/20/14 0919  . hyoscyamine (LEVSIN, ANASPAZ) tablet 0.125 mg  0.125 mg Oral Q6H PRN Gonzella Lex, MD      . levothyroxine (SYNTHROID, LEVOTHROID) tablet 75 mcg  75 mcg Oral QAC breakfast Gonzella Lex, MD   75 mcg at 09/20/14 [phone]  . lisinopril (PRINIVIL,ZESTRIL) tablet 30 mg  30 mg Oral Daily Gonzella Lex, MD   30 mg at 09/20/14 0919  . magnesium hydroxide (MILK OF MAGNESIA) suspension 30 mL  30 mL Oral Daily PRN Gonzella Lex, MD      . nicotine (NICOTROL) 10 MG inhaler 1 continuous puffing  1 continuous puffing Inhalation PRN Clovis Fredrickson, MD   1 continuous puffing at 09/17/14 1648  . pantoprazole (PROTONIX) EC tablet 40 mg  40 mg Oral Daily Gonzella Lex, MD   40 mg at 09/20/14 0919  . QUEtiapine (SEROQUEL) tablet 25 mg  25 mg Oral Q4H PRN Gonzella Lex, MD      . QUEtiapine (SEROQUEL) tablet 300 mg  300 mg Oral QHS Gonzella Lex, MD   300 mg at 09/19/14 2116  . [START ON 09/21/2014] venlafaxine XR (EFFEXOR-XR) 24 hr capsule 150 mg  150 mg Oral Q breakfast Rainey Pines, MD        Lab Results: No results found for this or any previous visit (from the past 48 hour(s)).  Physical Findings: AIMS: Facial and Oral  Movements Muscles of Facial Expression: None, normal Lips and Perioral Area: None, normal Jaw: None, normal Tongue: None, normal,Extremity Movements Upper (arms, wrists, hands, fingers): None, normal Lower (legs, knees, ankles, toes): None, normal, Trunk Movements Neck, shoulders, hips: None, normal, Overall Severity Severity of abnormal movements (highest score from questions above): None, normal Incapacitation due to abnormal movements: None, normal Patient's awareness of abnormal movements (rate only patient's report): No Awareness, Dental Status Current problems with teeth and/or dentures?: No Does patient usually wear dentures?: No  CIWA:    COWS:     Treatment Plan Summary: Daily contact with patient to assess and evaluate symptoms  and progress in treatment   Medical Decision Making:  Established Problem, Stable/Improving (1)  Discussed patient about  his medications and will adjust them as follows. I will discontinue the Zyprexa as it is making him too tired in combination with Seroquel. I will also discontinue the Ambien as he is also getting amitriptyline at night He will continue on venlafaxine 150 mg in the morning Discussed with patient about the medications and he agreed to the plan Closely monitor the adverse effects, efficacy and therapeutic response of medication. Pt will attend the group and milieu therapy.  Pt will be evaluated by the treatment team on a regular basis to discuss treatment plan and discharge planning.  SW and other staff to help with disposition.   Thank you for allowing me to participate in care of this pt.   This note was generated in part or whole with voice recognition software. Voice regonition is usually quite accurate but there are transcription errors that can and very often do occur. I apologize for any typographical errors that were not detected and corrected.    Rainey Pines 09/20/2014, 1:19 PM

## 2014-09-21 MED ORDER — QUETIAPINE FUMARATE 25 MG PO TABS: 25.0000 mg | ORAL_TABLET | Freq: Two times a day (BID) | ORAL | Status: DC

## 2014-09-21 MED ORDER — QUETIAPINE FUMARATE 25 MG PO TABS
12.5000 mg | ORAL_TABLET | Freq: Two times a day (BID) | ORAL | Status: DC
  Administered 2014-09-21 – 2014-09-22 (×2): 12.5 mg via ORAL
  Filled 2014-09-21 (×3): qty 1

## 2014-09-21 MED ORDER — VENLAFAXINE HCL ER 37.5 MG PO CP24
37.5000 mg | ORAL_CAPSULE | Freq: Every day | ORAL | Status: DC
  Administered 2014-09-22: 37.5 mg via ORAL
  Filled 2014-09-21: qty 1

## 2014-09-21 NOTE — Progress Notes (Signed)
[facility] MD Progress Note  09/21/2014 7:22 AM Doc Mandala  MRN:  [id number] Subjective:   Patient is a 53 year old male with history of schizophrenia who was admitted due to agitation and threatening his peers in the context of noncompliance at his group home. Patient was seen for follow-up. He appeared very hyper, animated and was focused on getting back to his group home and take his little dog back. He stated that he is scared to call them as he does not know how they will treat him. He stated that he slept well last night.  He currently denied having any perceptual disturbances.He follows at [facility] in [place].   Principal Problem: Schizophrenia Diagnosis:   Patient Active Problem List   Diagnosis Date Noted  . 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 Undifferentiated schizophrenia [F20.3]   . Hypothyroidism [E03.9] 09/17/2014  . GERD (gastroesophageal reflux disease) [K21.9] 09/17/2014  . Tobacco use disorder [Z72.0] 09/17/2014  . Schizophrenia [F20.9] 09/16/2014  . Non compliance w medication regimen [Z91.14] 09/16/2014  . Asthma [J45.909] 09/16/2014  . HTN (hypertension) [I10] 09/16/2014  . Tardive dyskinesia [G24.01] 09/16/2014   Total Time spent with patient: 20 minutes   Past Medical History:  Past Medical History  Diagnosis Date  . Depression   . Hypertension   . Hypothyroid   . COPD (chronic obstructive pulmonary disease)     Past Surgical History  Procedure Laterality Date  . Back surgery     Family History: History reviewed. No pertinent family history. Social History:  History  Alcohol Use No     History  Drug Use  . 1.00 per week  . Special: Marijuana    History   Social History  . Marital Status: Single    Spouse Name: N/A  . Number of Children: N/A  . Years of Education: N/A   Social History Main Topics  . Smoking status: Current Every Day Smoker -- 1.00 packs/day    Types: Cigarettes  . Smokeless tobacco: Never Used  . Alcohol Use: No  . Drug  Use: 1.00 per week    Special: Marijuana  . Sexual Activity: Not on file   Other Topics Concern  . None   Social History Narrative   Additional History:    Sleep: Good  Appetite:  Fair   Assessment:   Musculoskeletal: Strength & Muscle Tone: within normal limits Gait & Station: normal Patient leans: N/A   Psychiatric Specialty Exam: Physical Exam   Review of Systems  Constitutional: Positive for malaise/fatigue.  HENT: Negative for congestion and ear pain.   Eyes: Negative for pain.  Respiratory: Negative for hemoptysis.   Gastrointestinal: Negative for nausea and diarrhea.  Genitourinary: Negative for urgency.  Musculoskeletal: Negative for neck pain.  Skin: Negative for rash.  Neurological: Negative for tingling and speech change.  Endo/Heme/Allergies: Negative for environmental allergies.  Psychiatric/Behavioral: Negative for depression, hallucinations and substance abuse. The patient is nervous/anxious. The patient does not have insomnia.     Blood pressure 125/88, pulse 81, temperature 98.6 F (37 C), temperature source Oral, resp. rate 20, height 5\' 7"  (1.702 m), weight 68.04 kg (150 lb), SpO2 98 %.Body mass index is 23.49 kg/(m^2).  General Appearance: Casual   Eye Contact::  Fair   Speech:  Slow   Volume:  Increased  Mood:  Anxious and Depressed  Affect:  Full Range  Thought Process:  Disorganized  Orientation:  Full (Time, Place, and Person)  Thought Content:  NA  Suicidal Thoughts:  No  Homicidal Thoughts:  No  Memory: good  Judgement:  Intact  Insight:  Fair  Psychomotor Activity:  Psychomotor Retardation   Concentration:  Fair  Recall:  [place] of Knowledge:Fair  Language: Fair  Akathisia:  No  Handed:  Right  AIMS (if indicated):   none  Assets:  Communication Skills  ADL's:  Intact  Cognition: WNL  Sleep:  Number of Hours: 7.5     Current Medications: Current Facility-Administered Medications  Medication Dose Route Frequency  Provider Last Rate Last Dose  . acetaminophen (TYLENOL) tablet 650 mg  650 mg Oral Q6H PRN Gonzella Lex, MD      . albuterol (PROVENTIL) (2.5 MG/3ML) 0.083% nebulizer solution 2.5 mg  2.5 mg Nebulization Q4H PRN Gonzella Lex, MD      . alum & mag hydroxide-simeth (MAALOX/MYLANTA) 200-200-20 MG/5ML suspension 30 mL  30 mL Oral Q4H PRN Gonzella Lex, MD   30 mL at 09/20/14 1239  . amitriptyline (ELAVIL) tablet 50 mg  50 mg Oral QHS Gonzella Lex, MD   50 mg at 09/20/14 2157  . docusate sodium (COLACE) capsule 100 mg  100 mg Oral BID Gonzella Lex, MD   100 mg at 09/20/14 2158  . hyoscyamine (LEVSIN, ANASPAZ) tablet 0.125 mg  0.125 mg Oral Q6H PRN Gonzella Lex, MD      . levothyroxine (SYNTHROID, LEVOTHROID) tablet 75 mcg  75 mcg Oral QAC breakfast Gonzella Lex, MD   75 mcg at 09/21/14 0645  . lisinopril (PRINIVIL,ZESTRIL) tablet 30 mg  30 mg Oral Daily Gonzella Lex, MD   30 mg at 09/20/14 0919  . magnesium hydroxide (MILK OF MAGNESIA) suspension 30 mL  30 mL Oral Daily PRN Gonzella Lex, MD      . nicotine (NICOTROL) 10 MG inhaler 1 continuous puffing  1 continuous puffing Inhalation PRN Clovis Fredrickson, MD   1 continuous puffing at 09/17/14 1648  . pantoprazole (PROTONIX) EC tablet 40 mg  40 mg Oral Daily Gonzella Lex, MD   40 mg at 09/20/14 0919  . QUEtiapine (SEROQUEL) tablet 25 mg  25 mg Oral Q4H PRN Gonzella Lex, MD      . QUEtiapine (SEROQUEL) tablet 300 mg  300 mg Oral QHS Gonzella Lex, MD   300 mg at 09/20/14 2157  . venlafaxine XR (EFFEXOR-XR) 24 hr capsule 150 mg  150 mg Oral Q breakfast Rainey Pines, MD        Lab Results: No results found for this or any previous visit (from the past 48 hour(s)).  Physical Findings: AIMS: Facial and Oral Movements Muscles of Facial Expression: None, normal Lips and Perioral Area: None, normal Jaw: None, normal Tongue: None, normal,Extremity Movements Upper (arms, wrists, hands, fingers): None, normal Lower (legs, knees,  ankles, toes): None, normal, Trunk Movements Neck, shoulders, hips: None, normal, Overall Severity Severity of abnormal movements (highest score from questions above): None, normal Incapacitation due to abnormal movements: None, normal Patient's awareness of abnormal movements (rate only patient's report): No Awareness, Dental Status Current problems with teeth and/or dentures?: No Does patient usually wear dentures?: No  CIWA:    COWS:     Treatment Plan Summary: Daily contact with patient to assess and evaluate symptoms and progress in treatment   Medical Decision Making:  Established Problem, Stable/Improving (1)  Discussed patient about  his medications and will adjust them as follows. I will add Seroquel 12.5 mg by mouth twice a day  to control his agitation and hyperactivity  He will continue on venlafaxine 37.5 mg in the morning Discussed with patient about the medications and he agreed to the plan Closely monitor the adverse effects, efficacy and therapeutic response of medication. Pt will attend the group and milieu therapy.  Pt will be evaluated by the treatment team on a regular basis to discuss treatment plan and discharge planning.  SW and other staff to help with disposition.   Thank you for allowing me to participate in care of this pt.   This note was generated in part or whole with voice recognition software. Voice regonition is usually quite accurate but there are transcription errors that can and very often do occur. I apologize for any typographical errors that were not detected and corrected.    Rainey Pines 09/21/2014, 7:22 AM

## 2014-09-21 NOTE — Progress Notes (Signed)
Recreation Therapy Notes  Date: 05.30.16 Time: 3:05 pm Location: Craft Room  Group Topic: Self-expression   Goal Area(s) Addresses:  Patient will draw a bottle. Patient will write at least one emotion they are experiencing.  Behavioral Response: Attentive, Interactive  Intervention: Bottled Up  Activity: Patients were instructed to draw a bottled of how they feel and write emotions they are feeling inside the bottle.   Education:LRT educated patients on different forms of self-expression.   Education Outcome: Acknowledges education/In group clarification offered  Clinical Observations/Feedback: Patient completed activity. Patient contributed to group discussion.  Leonette Monarch, LRT/CTRS 09/21/2014 4:14 PM
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
## 2014-09-21 NOTE — Plan of Care (Signed)
Problem: Alteration in mood; excessive anxiety as evidenced by: Goal: LTG-Patient's behavior demonstrates decreased anxiety (Patient's behavior demonstrates anxiety and he/she is utilizing learned coping skills to deal with anxiety-producing situations)  Outcome: Progressing Moving about freely on unit  With walker affect cheerful

## 2014-09-21 NOTE — Progress Notes (Signed)
Patient remains to look disserved . Affect cheerful on approach. Continue to walk  With a   Walker . Appetite good and voice no other concerns . Stated Depression 5 Hopelessness 4 and Anxiety 3 on Scale ( low 0-10 High). Attending unit programming. Patient voice of goal to get housing for him and his dog.  Patient continue to be insistent on not returning to group home .Compliant  with unit programming / medications Energy level normal , concentration good
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
## 2014-09-21 NOTE — BHH Group Notes (Signed)
[facility] Group Notes:  (Nursing/MHT/Case Management/Adjunct)  Date:  09/21/2014  Time:  1:31 PM  Type of Therapy:  Psychoeducational Skills  Participation Level:  Active  Participation Quality:  Appropriate  Affect:  Appropriate  Cognitive:  Appropriate  Insight:  Appropriate  Engagement in Group:  Engaged  Modes of Intervention:  Discussion and Education  Summary of Progress/Problems:  Adela Lank Willistine Ferrall 09/21/2014, 1:31 PM

## 2014-09-22 MED ORDER — QUETIAPINE FUMARATE 25 MG PO TABS
25.0000 mg | ORAL_TABLET | Freq: Three times a day (TID) | ORAL | Status: DC
  Administered 2014-09-22 – 2014-09-29 (×21): 25 mg via ORAL
  Filled 2014-09-22 (×21): qty 1

## 2014-09-22 MED ORDER — VENLAFAXINE HCL ER 75 MG PO CP24
75.0000 mg | ORAL_CAPSULE | Freq: Every day | ORAL | Status: DC
  Administered 2014-09-23 – 2014-09-29 (×7): 75 mg via ORAL
  Filled 2014-09-22 (×8): qty 1
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
## 2014-09-22 NOTE — Progress Notes (Signed)
Recreation Therapy Notes  Date: 05.31.16 Time: 3:00 pm Location: Craft Room  Group Topic: Goal Setting  Goal Area(s) Addresses:  Patient will be able to identify one supportive statement per peer in group. Patient will verbalize benefit of setting goals. Patient will verbalize benefit of supporting peers. Patient will identify benefit of feeling supported.  Behavioral Response: Attentive, Interactive  Intervention: Step By Step  Activity: Patients were given a worksheet of a foot and instructed to list their goal inside the foot. Patients passed their worksheets around and peers wrote encouraging advice on the outside of the foot.  Education: LRT educated patients on goal setting and why it is important.  Education Outcome: Acknowledges education/In group clarification offered  Clinical Observations/Feedback: Patient wrote goal on his worksheet and encouraging words on peer's papers. Patient contributed to group discussion by stating that it was not hard to set a personal goal, how it felt to get positive reinforcement from his peers, and how setting goals can benefit his life post d/c.  Leonette Monarch, LRT/CTRS 09/22/2014 3:56 PM

## 2014-09-22 NOTE — BHH Group Notes (Signed)
[place] LCSW Group Therapy  09/22/2014 3:30 PM  Type of Therapy:  Group Therapy  Participation Level:  Active  Participation Quality:  Appropriate, Attentive and Redirectable  Affect:  Appropriate  Cognitive:  Alert and Disorganized  Insight:  Improving  Engagement in Therapy:  Engaged  Modes of Intervention:  Socialization and Support  Summary of Progress/Problems: Patient attended and participated in group appropriately. Patient shared that he just wants his independence and does not want a living facility. Patient left group after becoming angry with another group member who was giving patient inappropriate advice.   Carmell Austria T 09/22/2014, 3:30 PM

## 2014-09-22 NOTE — Progress Notes (Signed)
D: Patient denies SI/HI/AVH.  Patient affect is and mood is anxious.  Patient did attend evening group. Patient visible on the milieu. No distress noted. A: Support and encouragement offered. Scheduled medications given to pt. Q 15 min checks continued for patient safety. R: Patient receptive. Patient remains safe on the unit.

## 2014-09-22 NOTE — BHH Group Notes (Signed)
[facility] Group Notes:  (Nursing/MHT/Case Management/Adjunct)  Date:  09/22/2014  Time:  1:43 PM  Type of Therapy:  Group Therapy  Participation Level:  Active  Participation Quality:  Appropriate and Sharing  Affect:  Appropriate  Cognitive:  Appropriate  Insight:  Appropriate  Engagement in Group:  Supportive  Modes of Intervention:  Discussion  Summary of Progress/Problems:  Celso Amy 09/22/2014, 1:43 PM

## 2014-09-22 NOTE — Progress Notes (Signed)
Patient Noted in good spirits this am shift . Interacting with peers on unit .Appetite good and voice no concerns around sleep. Appropriate ADL'S this am  Voice of energy level normal , and concerntration good Stated his depression level 5 Hopelessness 4 and Anxiety 3.  Denies suicidal ideation. Attending unit programming .Marland Kitchen Continue to walk with his waker and insistent on not  Returning to the group home .

## 2014-09-22 NOTE — Tx Team (Signed)
Interdisciplinary Treatment Plan Update (Adult)  Date:  09/22/2014 Time Reviewed:  3:40 PM  Progress in Treatment: Attending groups: No. Participating in groups:  No. Taking medication as prescribed:  Yes. Tolerating medication:  Yes. Family/Significant othe contact made:  Yes, individual(s) contacted:  CSW contacted patient's group home and spoke with staff at [facility] spoke with Maudie Mercury of [facility].  Patient understands diagnosis:  No. Patient reports he has a mental illness but not able to identify his diagnosis. Discussing patient identified problems/goals with staff:  Yes. Medical problems stabilized or resolved:  Yes. Denies suicidal/homicidal ideation: Yes. Issues/concerns per patient self-inventory:  No. Other:  New problem(s) identified: No, Describe:  none identified  Discharge Plan or Barriers: Patient may not return to [facility] and needs placement.  Patient currently has [facility] and will refer to [facility]  Reason for Continuation of Hospitalization: Aggression Medication stabilization  Comments:  Estimated length of stay: up to 7 days  New goal(s):  Review of initial/current patient goals per problem list:   See Care Plan  Attendees: Patient:   5/31/20163:40 PM  Family:   5/31/20163:40 PM  Physician:  Orson Slick, MD 5/31/20163:40 PM  Nursing:    5/31/20163:40 PM  Case Manager:   5/31/20163:40 PM  Counselor:   5/31/20163:40 PM  Other:  Carmell Austria, LCSWA 5/31/20163:40 PM  Other:  Everitt Amber, LRT 5/31/20163:40 PM  Other:  Toma Copier, LCSW 5/31/20163:40 PM  Other: Dossie Arbour, LCSW 5/31/20163:40 PM  Other: Tami Lin, RN 5/31/20163:40 PM  Other:  5/31/20163:40 PM  Other:  5/31/20163:40 PM  Other:  5/31/20163:40 PM  Other:  5/31/20163:40 PM  Other:   5/31/20163:40 PM   Scribe for Treatment Team:   Carmell Austria T, 09/22/2014, 3:40 PM

## 2014-09-22 NOTE — Plan of Care (Signed)
Problem: [facility] Participation in Recreation Therapeutic Interventions Goal: STG-Patient will demonstrate improved self esteem by identif STG: Self-Esteem - Within 8 treatment sessions, patient will verbalize at least 5 positive affirmation statements in each of 5 treatment sessions to increase self-esteem post d/c.  Outcome: Progressing Treatment Session 2; Completed 2 out of 5: At approximately 9:35 am, LRT met with patient in patient room. Patient verbalized 5 positive affirmation statements. Patient reported it felt "alright". LRT encouraged patient to continue saying positive affirmation statements.  Leonette Monarch, LRT/CTRS 05.31.16 11:38 am
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
## 2014-09-23 NOTE — Progress Notes (Signed)
Recreation Therapy Notes  Date: 06.01.16 Time: 3:00 pm Location: Craft Room  Group Topic: Self-esteem  Goal Area(s) Addresses:  Patient will write at least one positive trait about self. Patient will list one benefit of having a good self-esteem.  Behavioral Response: Attentive, Interactive, Disruptive  Intervention: I Am  Activity: Patients were given a worksheet with the letter I on it and instructed to list as many positive trait about themselves inside the letter.  Education: LRT educated patient on ways they can increase their self-esteem.  Education Outcome: Acknowledges education/In group clarification offered  Clinical Observations/Feedback: Patient completed activity by filling approximately 60% of letter. Patient contributed to group discussion by stating how it felt to list positive traits about himself, that it was difficult to think of positive traits, and how his self-esteem affects him. LRT had to redirect patient multiple times. Patient compliant.   Leonette Monarch, LRT/CTRS 09/23/2014 4:14 PM

## 2014-09-23 NOTE — Progress Notes (Signed)
D: Patient denies SI/HI/AVH.  Patient affect and mood are anxious.  Patient did attend evening group. Patient visible on the milieu. No distress noted. A: Support and encouragement offered. Scheduled medications given to pt. Q 15 min checks continued for patient safety. R: Patient receptive. Patient remains safe on the unit.

## 2014-09-23 NOTE — Plan of Care (Signed)
Problem: Alteration in mood; excessive anxiety as evidenced by: Goal: STG-Pt will report an absence of self-harm thoughts/actions (Patient will report an absence of self-harm thoughts or actions)  Outcome: Progressing Denies suicidal ideations  Able to verbalize

## 2014-09-23 NOTE — BHH Group Notes (Signed)
`   [facility] Group Notes:  (Nursing/MHT/Case Management/Adjunct)  Date:  09/23/2014  Time:  9:13 PM  Type of Therapy:  Group Therapy  Participation Level:  Active  Participation Quality:  Appropriate  Affect:  Excited  Cognitive:  Alert  Insight:  Appropriate  Engagement in Group:  Engaged  Modes of Intervention:  N/A  Summary of Progress/Problems:  Christopher Mason 09/23/2014, 9:13 PM
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
## 2014-09-23 NOTE — Plan of Care (Signed)
Problem: [facility] Participation in Recreation Therapeutic Interventions Goal: STG-Patient will demonstrate improved self esteem by identif STG: Self-Esteem - Within 8 treatment sessions, patient will verbalize at least 5 positive affirmation statements in each of 5 treatment sessions to increase self-esteem post d/c.  Outcome: Progressing Treatment Session 3; Completed 3 out of 5: At approximately 11:40 am, LRT met with patient in patient room. Patient verbalized 5 positive affirmation statements. Patient reported it felt "alright". LRT encouraged patient to continue saying positive affirmation statements.  Leonette Monarch, LRT/CTRS 06.01.16 1:18 pm

## 2014-09-23 NOTE — Progress Notes (Signed)
Patient continues to voice od derealization of staff finding him a place for him and his dog. Informed that this is not so . Stated he was going to be patience for it to happen. Voice of sleeping well last night. Stated his energy  level is normal , appetite and concentration good. Stated his depression is 6 , hopelessness 7 and anxiety 5. ( low 0-10 high ) Patient denies suicidal ideations .

## 2014-09-24 DIAGNOSIS — F1220 Cannabis dependence, uncomplicated: Secondary | ICD-10-CM | POA: Diagnosis present

## 2014-09-24 NOTE — [facility] Group Notes (Signed)
[facility] Group Notes:  (Nursing/MHT/Case Management/Adjunct)  Date:  09/24/2014  Time:  1:55 PM  Type of Therapy:  Group Therapy  Participation Level:  Active  Participation Quality:  Appropriate and Attentive  Affect:  Appropriate  Cognitive:  Appropriate  Insight:  Good  Engagement in Group:  Engaged  Modes of Intervention:  Discussion and Education  Summary of Progress/Problems:  Drake Leach 09/24/2014, 1:55 PM

## 2014-09-24 NOTE — Progress Notes (Signed)
D: Patient denies SI/HI/AVH.   Patient affect and mood is anxious.  Patient did attend evening group. Patient visible on the milieu. No distress noted. A: Support and encouragement offered. Scheduled medications given to pt. Q 15 min checks continued for patient safety. R: Patient receptive. Patient remains safe on the unit.

## 2014-09-24 NOTE — Tx Team (Signed)
Interdisciplinary Treatment Plan Update (Adult)  Date:  09/24/2014 Time Reviewed:  3:44 PM  Progress in Treatment: Attending groups: No. Participating in groups:  No. Taking medication as prescribed:  Yes. Tolerating medication:  Yes. Family/Significant othe contact made:  Yes, individual(s) contacted:  CSW contacted patient's group home and spoke with staff at [facility] [phone] CSW spoke with Maudie Mercury of [facility].  Patient understands diagnosis:  Yes.  Discussing patient identified problems/goals with staff:  Yes. Medical problems stabilized or resolved:  Yes. Denies suicidal/homicidal ideation: Yes. Issues/concerns per patient self-inventory:  No. Other:  New problem(s) identified: No, Describe:  none identified  Discharge Plan or Barriers: Patient may not return to [facility] and needs placement.  CST at [facility] is referring patient to [place] and report patient needs PASSR for [employer] and will check on placing patient in a hotel temporarily.   Reason for Continuation of Hospitalization: Aggression Medication stabilization  Comments:  Estimated length of stay: up to 4 days  New goal(s):  Review of initial/current patient goals per problem list:   See Care Plan  Attendees: Patient:   6/2/20163:44 PM  Family:   6/2/20163:44 PM  Physician:  Orson Slick, MD 6/2/20163:44 PM  Nursing:    6/2/20163:44 PM  Case Manager:   6/2/20163:44 PM  Counselor:   6/2/20163:44 PM  Other:  Carmell Austria, [place] 6/2/20163:44 PM  Other:  Everitt Amber, [place] 6/2/20163:44 PM  Other:  Toma Copier, LCSW 6/2/20163:44 PM  Other: Dossie Arbour, LCSW 6/2/20163:44 PM  Other: Garald Braver, Psych D 6/2/20163:44 PM  Other:  6/2/20163:44 PM  Other:  6/2/20163:44 PM  Other:  6/2/20163:44 PM  Other:  6/2/20163:44 PM  Other:   6/2/20163:44 PM   Scribe for Treatment Team:   Carmell Austria T, 09/24/2014, 3:44 PM

## 2014-09-24 NOTE — Progress Notes (Signed)
Recreation Therapy Notes  Date: 06.02.16 Time: 3:30 pm Location: Craft Room  Group Topic: Leisure Education/Coping Skills  Goal Area(s) Addresses: Patient will identify things they are grateful for. Patient will identify why it is important to be grateful.   Behavioral Response: Attentive, Interactive  Intervention: Grateful Wheel  Activity: Patients were given a "I am Grateful For" worksheet and instructed to list 2-3 things they were grateful for under each category.  Education: LRT educated patients on why it is important to be grateful.   Education Outcome: Acknowledges education/In group clarification offered   Clinical Observations/Feedback: Patient completed worksheet. Patient contributed to group discussion by stating things he is grateful for.  Leonette Monarch, LRT/CTRS 09/24/2014 4:23 PM

## 2014-09-24 NOTE — Progress Notes (Signed)
Patient alert and oriented x 4. Patient anxious for discharge, wants to live independently with his dog. Working with Education officer, museum on discharge.  Patient doing well in structured environment. Will continue to monitor clinical status and support through discharge transition.

## 2014-09-24 NOTE — Progress Notes (Signed)
Initial Nutrition Assessment  DOCUMENTATION CODES:     INTERVENTION:   (Meals and Snacks: Cater to patient preferences)  NUTRITION DIAGNOSIS:   (No nutrition concerns at this time)  GOAL:  Patient will meet greater than or equal to 90% of their needs  MONITOR:   (Energy Intake, Labs)  REASON FOR ASSESSMENT:   (RD Screen- LOS)    ASSESSMENT:  Reason For Admission: Schizophrenia PMHx:  Past Medical History  Diagnosis Date  . Depression   . Hypertension   . Hypothyroid   . COPD (chronic obstructive pulmonary disease)     Typical Fluid/ Food Intake: 100% of meals recorded per I/O Meal/ Snack Patterns: Unable to assess Supplements: None  Labs: reviewed Meds: reviewed  Physical Findings: n/a Weight Changes: per MST, no weight changes noted.   Height:  Ht Readings from Last 1 Encounters:  09/17/14 5\' 7"  (1.702 m)    Weight:  Wt Readings from Last 1 Encounters:  09/17/14 150 lb (68.04 kg)    Ideal Body Weight:     Wt Readings from Last 10 Encounters:  09/17/14 150 lb (68.04 kg)    BMI:  Body mass index is 23.49 kg/(m^2).  Skin:  Reviewed, no issues  Diet Order:  Diet regular Room service appropriate?: Yes; Fluid consistency:: Thin  EDUCATION NEEDS:  No education needs identified at this time   Intake/Output Summary (Last 24 hours) at 09/24/14 1515 Last data filed at 09/24/14 0900  Gross per 24 hour  Intake   1320 ml  Output      0 ml  Net   1320 ml    Last BM:  reviewed  Roda Shutters, RDN Pager: [phone] Office: [place]
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
## 2014-09-24 NOTE — Plan of Care (Signed)
Problem: [facility] Participation in Recreation Therapeutic Interventions Goal: STG-Patient will demonstrate improved self esteem by identif STG: Self-Esteem - Within 8 treatment sessions, patient will verbalize at least 5 positive affirmation statements in each of 5 treatment sessions to increase self-esteem post d/c.  Outcome: Progressing Treatment Session 4; Completed 4 out of 5: At approximately 9:00 am, LRT met with patient in patient room. Patient verbalized 5 positive affirmation statements. Patient reported it felt "alright" and it was feeling better. LRT encouraged patient to continue saying positive affirmation statements.  Leonette Monarch, LRT/CTRS 06.02.16 12:19 pm

## 2014-09-24 NOTE — Progress Notes (Deleted)
Recreation Therapy Notes  Date: 06.02.16 Time: 3:30 pm Location: Craft Room  Group Topic: Leisure Education/Coping Skills  Goal Area(s) Addresses: Patient will identify things they are grateful for. Patient will identify why it is important to be grateful.   Behavioral Response: Did not attend  Intervention: Grateful Wheel  Activity: Patients were given a "I am Grateful For" worksheet and instructed to list 2-3 things they were grateful for under each category.  Education: LRT educated patients on why it is important to be grateful.   Education Outcome: Patient did not attend group.  Clinical Observations/Feedback: Patient did not attend group.  Leonette Monarch, LRT/CTRS 09/24/2014 4:22 PM

## 2014-09-25 LAB — LIPID PANEL
Cholesterol: 260 mg/dL — ABNORMAL HIGH (ref 0–200)
HDL: 45 mg/dL (ref >40)
LDL Cholesterol: UNDETERMINED mg/dL (ref 0–99)
TRIGLYCERIDES: 462 mg/dL — AB (ref <150)
Total CHOL/HDL Ratio: 5.8 RATIO
VLDL: UNDETERMINED mg/dL (ref 0–40)

## 2014-09-25 LAB — HEMOGLOBIN A1C: Hgb A1c MFr Bld: 5.4 % (ref 4.0–6.0)

## 2014-09-25 MED ORDER — DOCUSATE SODIUM 100 MG PO CAPS: 100.0000 mg | ORAL_CAPSULE | Freq: Every day | ORAL | Status: DC

## 2014-09-25 MED ORDER — QUETIAPINE FUMARATE 300 MG PO TABS: 300.0000 mg | ORAL_TABLET | Freq: Every day | ORAL | Status: DC

## 2014-09-25 MED ORDER — ALBUTEROL SULFATE HFA 108 (90 BASE) MCG/ACT IN AERS: 2.0000 | INHALATION_SPRAY | RESPIRATORY_TRACT | Status: DC | PRN

## 2014-09-25 MED ORDER — HYOSCYAMINE SULFATE 0.125 MG PO TABS: 0.1250 mg | ORAL_TABLET | ORAL | Status: DC | PRN

## 2014-09-25 MED ORDER — OMEPRAZOLE 20 MG PO CPDR: 20.0000 mg | DELAYED_RELEASE_CAPSULE | Freq: Every day | ORAL | Status: DC

## 2014-09-25 MED ORDER — PRAVASTATIN SODIUM 10 MG PO TABS: 10.0000 mg | ORAL_TABLET | Freq: Every day | ORAL | Status: DC

## 2014-09-25 MED ORDER — LEVOTHYROXINE SODIUM 75 MCG PO TABS: 75.0000 ug | ORAL_TABLET | Freq: Every day | ORAL | Status: DC

## 2014-09-25 MED ORDER — LISINOPRIL 30 MG PO TABS: 30.0000 mg | ORAL_TABLET | Freq: Every day | ORAL | Status: DC

## 2014-09-25 MED ORDER — AMITRIPTYLINE HCL 50 MG PO TABS: 50.0000 mg | ORAL_TABLET | Freq: Every day | ORAL | Status: DC

## 2014-09-25 MED ORDER — QUETIAPINE FUMARATE 25 MG PO TABS: 25.0000 mg | ORAL_TABLET | Freq: Three times a day (TID) | ORAL | Status: DC
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
## 2014-09-25 NOTE — BHH Group Notes (Signed)
[facility] LCSW Aftercare Discharge Planning Group Note  09/25/2014 11:23 AM  Participation Quality:  Monopolizing and Redirectable  Affect:  Labile  Cognitive:  Disorganized  Insight:  Lacking  Engagement in Group:  Engaged  Modes of Intervention:  Limit-setting, Socialization and Support  Summary of Progress/Problems: Patient attended and participated in group but was redirected as patient gets upset when talking about his dog and his living situation. Patient lacks insight and puts his dog as a priority over his living situation.  Carmell Austria T 09/25/2014, 11:23 AM

## 2014-09-25 NOTE — Progress Notes (Signed)
Recreation Therapy Notes  INPATIENT RECREATION TR PLAN  Patient Details Name: Christopher Mason MRN: [id number] DOB: [birth year] Today's Date: 09/25/2014  Rec Therapy Plan Is patient appropriate for Therapeutic Recreation?: Yes Treatment times per week: At least 3 times a week TR Treatment/Interventions: 1:1 session, Group participation (Comment) (Appropriate participation in daily recreation therapy tx)  Discharge Criteria Pt will be discharged from therapy if:: Discharged Treatment plan/goals/alternatives discussed and agreed upon by:: Patient/family  Discharge Summary Short term goals set: See Care Plan Short term goals met: Complete Progress toward goals comments: One-to-one attended Which groups?: Self-esteem, Leisure education, Coping skills, Goal setting, Other (Comment) (Self-expression) One-to-one attended: Self-esteem Reason goals not met: N/A Therapeutic equipment acquired: None Reason patient discharged from therapy: Discharge from hospital Pt/family agrees with progress & goals achieved: Yes Date patient discharged from therapy: 09/25/14   Leonette Monarch, LRT/CTRS 09/25/2014, 12:59 PM

## 2014-09-25 NOTE — Progress Notes (Signed)
D: Patient denies SI/HI/AVH.   Patient affect and mood are anxious.  Patient did attend evening group. Patient visible on the milieu. No distress noted. A: Support and encouragement offered. Scheduled medications given to pt. Q 15 min checks continued for patient safety. R: Patient receptive. Patient remains safe on the unit.
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
## 2014-09-25 NOTE — Progress Notes (Signed)
Recreation Therapy Notes  Date: 06.03.16 Time: 3:00 pm Location: Craft Room  Group Topic: Self-expression/Coping skills  Goal Area(s) Addresses:  Patient will effectively use art as a means of self-expression. Patient will recognize positive benefit of self-expression. Patient will be able to identify one emotion experienced during group session. Patient will identify use of art as a coping skill.  Behavioral Response: Did not attend  Intervention: Two Faces of Me  Activity: Patients were given a blank face worksheet and instructed to draw a line down the middle. On one side of the face, patients were instructed to draw or write how they felt when they were admitted. On the other side of the face, patient were instructed to draw or write how they want to feel when they are d/c.  Education: LRT educated patients on how art is an important coping skill.   Education Outcome: Patient did not attend group.   Clinical Observations/Feedback: Patient did not attend group.  Leonette Monarch, LRT/CTRS 09/25/2014 4:11 PM

## 2014-09-25 NOTE — BHH Suicide Risk Assessment (Signed)
[facility] Discharge Suicide Risk Assessment   Demographic Factors:  Male and Caucasian  Total Time spent with patient: 30 minutes  Musculoskeletal: Strength & Muscle Tone: within normal limits Gait & Station: normal Patient leans: N/A  Psychiatric Specialty Exam: Physical Exam  Nursing note and vitals reviewed.   Review of Systems  All other systems reviewed and are negative.   Blood pressure 116/80, pulse 85, temperature 98 F (36.7 C), temperature source Oral, resp. rate 20, height 5\' 7"  (1.702 m), weight 68.04 kg (150 lb), SpO2 98 %.Body mass index is 23.49 kg/(m^2).  General Appearance: Casual  Eye Contact::  Good  Speech:  Clear and [id number]  Volume:  Normal  Mood:  Euthymic  Affect:  Appropriate  Thought Process:  Goal Directed  Orientation:  Full (Time, Place, and Person)  Thought Content:  WDL  Suicidal Thoughts:  No  Homicidal Thoughts:  No  Memory:  Immediate;   Fair Recent;   Fair Remote;   Fair  Judgement:  Fair  Insight:  Fair  Psychomotor Activity:  Normal  Concentration:  Fair  Recall:  [employer] [facility] [place]  Language: Fair  Akathisia:  No  Handed:  Right  AIMS (if indicated):     Assets:  Communication Skills Desire for Improvement Financial Resources/Insurance Social Support  Sleep:  Number of Hours: 4  Cognition: WNL  ADL's:  Intact   Have you used any form of tobacco in the last 30 days? (Cigarettes, Smokeless Tobacco, Cigars, and/or Pipes): Yes  Has this patient used any form of tobacco in the last 30 days? (Cigarettes, Smokeless Tobacco, Cigars, and/or Pipes) Yes, A prescription for an FDA-approved tobacco cessation medication was offered at discharge and the patient refused  Mental Status Per Nursing Assessment::   On Admission:  NA  Current Mental Status by Physician: NA  Loss Factors: NA  Historical Factors: Impulsivity  Risk Reduction Factors:   Positive therapeutic relationship  Continued Clinical Symptoms:   Schizophrenia:   Paranoid or undifferentiated type  Cognitive Features That Contribute To Risk:  None    Suicide Risk:  Minimal: No identifiable suicidal ideation.  Patients presenting with no risk factors but with morbid ruminations; may be classified as minimal risk based on the severity of the depressive symptoms  Principal Problem: Undifferentiated schizophrenia Discharge Diagnoses:  Patient Active Problem List   Diagnosis Date Noted  . Cannabis use disorder, severe, dependence [F12.20] 09/24/2014  . Undifferentiated schizophrenia [F20.3]   . Hypothyroidism [E03.9] 09/17/2014  . GERD (gastroesophageal reflux disease) [K21.9] 09/17/2014  . Tobacco use disorder [Z72.0] 09/17/2014  . Non compliance w medication regimen [Z91.14] 09/16/2014  . Asthma [J45.909] 09/16/2014  . HTN (hypertension) [I10] 09/16/2014  . Tardive dyskinesia [G24.01] 09/16/2014      Plan Of Care/Follow-up recommendations:  Activity:  as tolerated Diet:  low sodium heart healthy Other:  keep follow up appointments  Is patient on multiple antipsychotic therapies at discharge:  No   Has Patient had three or more failed trials of antipsychotic monotherapy by history:  No  Recommended Plan for Multiple Antipsychotic Therapies: NA    Christopher Mason 09/25/2014, 10:31 AM

## 2014-09-25 NOTE — Plan of Care (Signed)
Problem: [facility] Participation in Recreation Therapeutic Interventions Goal: STG-Patient will demonstrate improved self esteem by identif STG: Self-Esteem - Within 8 treatment sessions, patient will verbalize at least 5 positive affirmation statements in each of 5 treatment sessions to increase self-esteem post d/c.  Outcome: Completed/Met Date Met:  09/25/14 Treatment Session 5; Completed 5 out of 5: At approximately 11:20 am, LRT met with patient in patient room. Patient verbalized 5 positive affirmation statements. Patient reported it felt "alright". Patient stated he felt "a little bit" better about himself. LRT encouraged patient to continue saying positive affirmation statements.  Leonette Monarch, LRT/CTRS 06.03.16 12:57 pm

## 2014-09-25 NOTE — BHH Group Notes (Signed)
[facility] Group Notes:  (Nursing/MHT/Case Management/Adjunct)  Date:  09/25/2014  Time:  1:20 AM  Type of Therapy:  Group Therapy  Participation Level:  Active  Participation Quality:  Appropriate  Affect:  Appropriate  Cognitive:  Appropriate  Insight:  Appropriate  Engagement in Group:  Distracting and Engaged  Modes of Intervention:  Discussion  Summary of Progress/Problems:  Christopher Mason 09/25/2014, 1:20 AM

## 2014-09-26 ENCOUNTER — Encounter: Payer: Self-pay | Admitting: Psychiatry

## 2014-09-26 MED ORDER — LORAZEPAM 2 MG PO TABS
ORAL_TABLET | ORAL | Status: AC
  Administered 2014-09-26: 2 mg via ORAL
  Filled 2014-09-26: qty 1

## 2014-09-26 MED ORDER — LORAZEPAM 2 MG PO TABS
2.0000 mg | ORAL_TABLET | Freq: Once | ORAL | Status: AC
  Administered 2014-09-26: 2 mg via ORAL

## 2014-09-26 NOTE — Plan of Care (Signed)
Problem: Ineffective individual coping Goal: STG: Patient will remain free from self harm Outcome: Progressing Patient had no thoughts or acts of self harm.
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
## 2014-09-26 NOTE — [facility] Group Notes (Signed)
[facility] Group Notes:  (Nursing/MHT/Case Management/Adjunct)  Date:  09/26/2014  Time:  10:36 AM  Type of Therapy:  Community Meeting   Participation Level:  None  Participation Quality:  Intrusive and Inattentive  Affect:  Angry  Cognitive:  Disorganized, Confused and Delusional  Insight:  Lacking  Engagement in Group:  Lacking and Off Topic  Modes of Intervention:  Discussion  Summary of Progress/Problems: patient was very rude in group. Constantly interrupted other patients and was cursing and calling other patients bad names. As well as Patent attorney.   Lisabeth Mian De'Chelle Meztli Llanas 09/26/2014, 10:36 AM

## 2014-09-26 NOTE — Progress Notes (Signed)
Patient is alert and oriented. Visible within the community and social with peers. Participating in all groups. Mood labile.  He was assisted with shaving today and was pleased about that. . Will continue to monitor for safety. Marland Kitchen

## 2014-09-26 NOTE — Progress Notes (Signed)
Care taken over at 2400 and patient seemed to sleep well through out the night with no issues to report on shift at this time.

## 2014-09-27 MED ORDER — LISINOPRIL 20 MG PO TABS
40.0000 mg | ORAL_TABLET | Freq: Every day | ORAL | Status: DC
  Administered 2014-09-28 – 2014-09-29 (×2): 40 mg via ORAL
  Filled 2014-09-27 (×2): qty 2

## 2014-09-27 NOTE — [facility] Group Notes (Signed)
[facility] LCSW Group Therapy  09/27/2014 3:26 PM  Type of Therapy:  Group Therapy  Participation Level:  Did Not Attend  Participation Quality:  n/a  Affect:  n/a  Cognitive:  n/a  Insight:  n/a  Engagement in Therapy:  n/a  Modes of Intervention:  n/a  Summary of Progress/Problems:  Carmell Austria T 09/27/2014, 3:26 PM

## 2014-09-27 NOTE — Progress Notes (Signed)
Pt visible on the unit. Spending time in the day room. Loud at times but easily redirected. Complained of neck pain, tylenol given with relief. Med compliant. Appears to be resting at this time.

## 2014-09-27 NOTE — Progress Notes (Signed)
Patient is seen in the milieu. Alert and oriented and denying SI. Speech is loud and spontaneous but pt maintains positive attitude. Patient attended group and was attentive per staff  Directions. Support and encouragements offered and pt remains safe on the unit.
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
## 2014-09-27 NOTE — Progress Notes (Signed)
Patient is alert and oriented. Visible within the community and social with peers. Participating in all groups. Mood labile. He was assisted with shaving today and was pleased about that. . Will continue to monitor for safety. Marland Kitchen

## 2014-09-28 MED ORDER — QUETIAPINE FUMARATE 100 MG PO TABS
100.0000 mg | ORAL_TABLET | Freq: Once | ORAL | Status: AC
Start: 2014-09-28 — End: 2014-09-28
  Administered 2014-09-28: 100 mg via ORAL
  Filled 2014-09-28: qty 1

## 2014-09-28 MED ORDER — LISINOPRIL 40 MG PO TABS: 40.0000 mg | ORAL_TABLET | Freq: Every day | ORAL | Status: DC

## 2014-09-28 MED ORDER — VENLAFAXINE HCL 50 MG PO TABS: 50.0000 mg | ORAL_TABLET | Freq: Two times a day (BID) | ORAL | Status: DC

## 2014-09-28 NOTE — Clinical Social Work Note (Signed)
LCSW called [facility] at 5:20pm to see if they were answering front desk. Left another detailed voice message. Dwight CF Director of [facility], left her a detailed message. Awaiting call back, if no response by either party will call Zack  Orthoptist )

## 2014-09-28 NOTE — [facility] Suicide Risk Assessment (Signed)
[place] INPATIENT:  Family/Significant Other Suicide Prevention Education  Suicide Prevention Education:  Patient Refusal for Family/Significant Other Suicide Prevention Education: The patient Christopher Mason has refused to provide written consent for family/significant other to be provided Family/Significant Other Suicide Prevention Education during admission and/or prior to discharge.  Physician notified. CSW was notified by [facility] CST that patient has no family support and CST is his support.  Carmell Austria T 09/28/2014, 1:38 PM
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
## 2014-09-28 NOTE — Progress Notes (Signed)
Patient continues to focus on discharge, very impatient with delays in finding him safe and suitable housing. Minimal response to reassurance and ways to cope with this particular stressor.  Patient continues to be compliant with medications and has been attending all groups.

## 2014-09-28 NOTE — BHH Suicide Risk Assessment (Signed)
[facility] Discharge Suicide Risk Assessment   Demographic Factors:  Male, Caucasian, Low socioeconomic status and Living alone  Total Time spent with patient: 30 minutes  Musculoskeletal: Strength & Muscle Tone: within normal limits Gait & Station: normal Patient leans: N/A  Psychiatric Specialty Exam: Physical Exam  Nursing note and vitals reviewed.   Review of Systems  All other systems reviewed and are negative.   Blood pressure 131/86, pulse 89, temperature 98.2 F (36.8 C), temperature source Oral, resp. rate 20, height 5\' 7"  (1.702 m), weight 68.04 kg (150 lb), SpO2 99 %.Body mass index is 23.49 kg/(m^2).  General Appearance: Casual  Eye Contact::  Good  Speech:  Clear and [id number]  Volume:  Normal  Mood:  Euthymic  Affect:  Appropriate  Thought Process:  Goal Directed  Orientation:  Full (Time, Place, and Person)  Thought Content:  WDL  Suicidal Thoughts:  No  Homicidal Thoughts:  No  Memory:  Immediate;   Fair Recent;   Fair Remote;   Fair  Judgement:  Fair  Insight:  Fair  Psychomotor Activity:  Normal  Concentration:  Fair  Recall:  [employer] [facility] [place]  Language: Fair  Akathisia:  No  Handed:  Right  AIMS (if indicated):     Assets:  Communication Skills Desire for Improvement Financial Resources/Insurance Resilience Social Support  Sleep:  Number of Hours: 8  Cognition: WNL  ADL's:  Intact   Have you used any form of tobacco in the last 30 days? (Cigarettes, Smokeless Tobacco, Cigars, and/or Pipes): Yes  Has this patient used any form of tobacco in the last 30 days? (Cigarettes, Smokeless Tobacco, Cigars, and/or Pipes) Yes, A prescription for an FDA-approved tobacco cessation medication was offered at discharge and the patient refused  Mental Status Per Nursing Assessment::   On Admission:  NA  Current Mental Status by Physician: NA  Loss Factors: Loss of significant relationship  Historical Factors: Impulsivity  Risk Reduction  Factors:   Positive social support  Continued Clinical Symptoms:  Bipolar Disorder:   Depressive phase  Cognitive Features That Contribute To Risk:  None    Suicide Risk:  Minimal: No identifiable suicidal ideation.  Patients presenting with no risk factors but with morbid ruminations; may be classified as minimal risk based on the severity of the depressive symptoms  Principal Problem: Undifferentiated schizophrenia Discharge Diagnoses:  Patient Active Problem List   Diagnosis Date Noted  . Cannabis use disorder, severe, dependence [F12.20] 09/24/2014  . Undifferentiated schizophrenia [F20.3]   . Hypothyroidism [E03.9] 09/17/2014  . GERD (gastroesophageal reflux disease) [K21.9] 09/17/2014  . Tobacco use disorder [Z72.0] 09/17/2014  . Non compliance w medication regimen [Z91.14] 09/16/2014  . Asthma [J45.909] 09/16/2014  . HTN (hypertension) [I10] 09/16/2014  . Tardive dyskinesia [G24.01] 09/16/2014    Follow-up Information    Follow up with [facility] team.   Why:  Hospital Discharge Follow up   Contact information:   [address] [place]  P [phone]  Fax [phone]      Plan Of Care/Follow-up recommendations:  Activity:  As tolerated Diet:  Low sodium heart healthy Other:  Keep follow-up appointments  Is patient on multiple antipsychotic therapies at discharge:  No   Has Patient had three or more failed trials of antipsychotic monotherapy by history:  No  Recommended Plan for Multiple Antipsychotic Therapies: NA    Christopher Mason 09/28/2014, 11:30 AM

## 2014-09-28 NOTE — Plan of Care (Signed)
Problem: Alteration in mood; excessive anxiety as evidenced by: Goal: LTG-Patient's behavior demonstrates decreased anxiety (Patient's behavior demonstrates anxiety and he/she is utilizing learned coping skills to deal with anxiety-producing situations)  Outcome: Progressing Patient is less anxious, able to discuss his feeling with staff and to utilize coping skills

## 2014-09-28 NOTE — Progress Notes (Deleted)
  [facility] Adult Case Management Discharge Plan :  Will you be returning to the same living situation after discharge:  Yes,  [facility] [place] At discharge, do you have transportation home?: Yes,  TBD Do you have the ability to pay for your medications: Yes,  Medicaid  Release of information consent forms completed and in the chart;  Patient's signature needed at discharge.  Patient to Follow up at: Follow-up Information    Follow up with [facility] team [place].   Why:  Hospital Discharge Follow up   Contact information:   [place] Ph [phone] Fax [phone]      Patient denies SI/HI: Yes,       Safety Planning and Suicide Prevention discussed: Yes 1-1 review and suicide prevention handout providied  Have you used any form of tobacco in the last 30 days? (Cigarettes, Smokeless Tobacco, Cigars, and/or Pipes): Yes  Has patient been referred to the Quitline?: Patient refused referral  Christopher Mason 09/28/2014, 8:41 AM

## 2014-09-28 NOTE — Progress Notes (Signed)
Recreation Therapy Notes  Date: 06.06.16 Time: 3:00 pm Location: Craft Room  Group Topic: Wellness  Goal Area(s) Addresses:  Patient will identify at least one item per dimension of health. Patient will examine areas they are deficient in.  Behavioral Response: Attentive, Interactive  Intervention: 6 Dimensions of Health  Activity: Patients were given a worksheet with the 6 dimensions of health on it. Patients were given a worksheet with the 6 dimensions of health on it and instructed to list at least 1 item under each category.   Education: LRT educated patients on each dimension of health and how they can increase each dimension.  Education Outcome: Acknowledges education/In group clarification offered  Clinical Observations/Feedback: Patient completed activity by listing at least one item in each category. Patient contributed to group discussion by stating how he can improve certain categories. LRT had to redirect patient. Patient complied.  Leonette [facility], LRT/CTRS 09/28/2014 4:17 PM

## 2014-09-28 NOTE — Progress Notes (Signed)
Patient went to sleep at bed time. Has been sleeping soudan. No visible signs of distress. Safety precautions maintained.

## 2014-09-28 NOTE — BHH Group Notes (Signed)
[facility] LCSW Aftercare Discharge Planning Group Note  09/28/2014 10:04 AM  Participation Quality:  Appropriate  Affect:  Appropriate  Cognitive:  Appropriate  Insight:  Engaged  Engagement in Group:  Engaged  Modes of Intervention:  Discussion, Education and Support  Summary of Progress/Problems:Patients goal today is to come to group and to plan his discharge. He really wants to see his dog  Joana Reamer 09/28/2014, 10:04 AM

## 2014-09-28 NOTE — BHH Group Notes (Signed)
[facility] LCSW Group Therapy  09/28/2014 5:29 PM  Type of Therapy:  Group Therapy  Participation Level:  Active  Participation Quality:  Attentive  Affect:  Appropriate  Cognitive:  Appropriate  Insight:  Developing/Improving  Engagement in Therapy:  Developing/Improving  Modes of Intervention:  Discussion, Education, Exploration and Support  Summary of Progress/Problems:Todays topic was on stress and anxiety and each group participant were encouraged to discuss some types of stress/anxiety they face and how best to deal with stressful situations and anxiety. This patient was able to contribute and support others well.  Enis Slipper M 09/28/2014, 5:29 PM

## 2014-09-28 NOTE — BHH Group Notes (Signed)
[facility] Group Notes:  (Nursing/MHT/Case Management/Adjunct)  Date:  09/28/2014  Time:  11:53 AM  Type of Therapy:  Group Therapy  Participation Level:  Minimal  Participation Quality:  Intrusive and Inattentive  Affect:  Anxious and Tearful  Cognitive:  Alert  Insight:  Good  Engagement in Group:  Left early  Modes of Intervention:  Socialization  Summary of Progress/Problems:  Christopher Mason 09/28/2014, 11:53 AM

## 2014-09-28 NOTE — BHH Suicide Risk Assessment (Deleted)
Christopher Mason INPATIENT:  Family/Significant Other Suicide Prevention Education  Suicide Prevention Education:  Education Completed [facility] has been identified by the patient as the family member/significant other with whom the patient will be residing, and identified as the person(s) who will aid the patient in the event of a mental health crisis (suicidal ideations/suicide attempt).  With written consent from the patient, the family member/significant other has been provided the following suicide prevention education, prior to the and/or following the discharge of the patient.  The suicide prevention education provided includes the following:  Suicide risk factors  Suicide prevention and interventions  National Suicide Hotline telephone number  [facility] assessment telephone number  [facility] [place] and/or Residential Mobile Crisis Unit telephone number  Request made of family/significant other to:  Remove weapons (e.g., guns, rifles, knives), all items previously/currently identified as safety concern.    Remove drugs/medications (over-the-counter, prescriptions, illicit drugs), all items previously/currently identified as a safety concern.  The family member/significant other verbalizes understanding of the suicide prevention education information provided.  The family member/significant other agrees to remove the items of safety concern listed above.  Christopher Mason 09/28/2014, 8:45 AM and Patient Discharged to [facility] [place]:  Suicide Prevention Education Not Provided: {PT. DISCHARGED TO OTHER HEALTHCARE FACILITY:SUICIDE PREVENTION EDUCATION NOT PROVIDED ([facility]):  The patient is discharging to another healthcare facility for continuation of treatment.  The patient's medical information, including suicide ideations and risk factors, are a part of the medical information shared with the receiving  healthcare facility.  Christopher Mason 09/28/2014, 8:45 AM

## 2014-09-28 NOTE — BHH Group Notes (Signed)
[facility] Group Notes:  (Nursing/MHT/Case Management/Adjunct)  Date:  09/28/2014  Time:  3:54 AM  Type of Therapy:  Group Therapy  Participation Level:  Active  Participation Quality:  Appropriate  Affect:  Appropriate  Cognitive:  Appropriate  Insight:  Appropriate  Engagement in Group:  Engaged  Modes of Intervention:  n/a  Summary of Progress/Problems:  Christopher Mason 09/28/2014, 3:54 AM

## 2014-09-28 NOTE — Clinical Social Work Note (Signed)
CSW received approval for $181.09 for Motel expense at [facility] for patient discharge. CSW called [facility] [phone] and left vm for staff to call Jaclynn Guarneri of [facility] to pay for motel stay for 1 week. Jaclynn Guarneri [phone]  approved and asked that motel calls for credit card info and payment however hours of operation conflict. Motel hours are 11a-1p and 5:30p-7p and Jaclynn Guarneri works till [employer] so [place] will attempt to make this arrangement between 5:30p and 6p but if she is not able, arrangements will have to be made in the morning. Jaclynn Guarneri also said she could call at 5:30p for credit card payment and CSW left her info for [employer] contact.

## 2014-09-28 NOTE — Clinical Social Work Note (Signed)
LCSW called the [facility]- and finally spoke to James/Proprietor/manager. He stated no one from [place] called and they DONT accept credit cards, nor was the room saved. LCSW informed NO VACANCY. As per Nurse and LCSW safe discharge not probable this evening ( Be advised the  [facility] clerk was not answering phones from 1-5:30pm).

## 2014-09-28 NOTE — Clinical Social Work Note (Signed)
CSW called [facility] with patient at [phone] and room is available for 1 week at a rate $181.09. CSW reqeusted funding from [employer] to pay as patient's income is in limbo and [facility] Renard Matter is working on getting access to patient income. Patient has been refusing to go without his dog but is now willing to accept temporary housing till something more permanent can be found for him to keep his dog. Patient is also give prescription for a therapy dog.

## 2014-09-29 MED ORDER — LEVOTHYROXINE SODIUM 75 MCG PO TABS: 75.0000 ug | ORAL_TABLET | Freq: Every day | ORAL | Status: DC

## 2014-09-29 MED ORDER — VENLAFAXINE HCL 50 MG PO TABS: 50.0000 mg | ORAL_TABLET | Freq: Two times a day (BID) | ORAL | Status: DC

## 2014-09-29 MED ORDER — QUETIAPINE FUMARATE 300 MG PO TABS: 300.0000 mg | ORAL_TABLET | Freq: Every day | ORAL | Status: DC

## 2014-09-29 MED ORDER — LISINOPRIL 40 MG PO TABS: 40.0000 mg | ORAL_TABLET | Freq: Every day | ORAL | Status: DC

## 2014-09-29 MED ORDER — OMEPRAZOLE 20 MG PO CPDR: 20.0000 mg | DELAYED_RELEASE_CAPSULE | Freq: Every day | ORAL | Status: DC

## 2014-09-29 MED ORDER — PRAVASTATIN SODIUM 10 MG PO TABS: 10.0000 mg | ORAL_TABLET | Freq: Every day | ORAL | Status: DC

## 2014-09-29 MED ORDER — AMITRIPTYLINE HCL 50 MG PO TABS: 50.0000 mg | ORAL_TABLET | Freq: Every day | ORAL | Status: DC

## 2014-09-29 NOTE — Progress Notes (Signed)
AVS H&P Discharge Summary faxed to [facility] for hospital follow-up

## 2014-09-29 NOTE — [facility] Group Notes (Signed)
[facility] Notes:  (Nursing/MHT/Case Management/Adjunct)  Date:  09/29/2014  Time:  1:31 AM  Type of Therapy:  Group Therapy  Participation Level:  Active  Participation Quality:  Monopolizing  Affect:  Angry  Cognitive:  Lacking  Insight:  Lacking  Engagement in Group:  Monopolizing  Modes of Intervention:  Discussion  Summary of Progress/Problems:  Marylynn Pearson 09/29/2014, 1:31 AM

## 2014-09-29 NOTE — [facility]HH Group Notes (Signed)
[facility] Notes:  (Nursing/MHT/Case Management/Adjunct)  Date:  09/29/2014  Time:  2:07 PM  Type of Therapy:  Group Therapy  Participation Level:  Minimal  Participation Quality:  Monopolizing and Left early  Affect:  Excited  Cognitive:  Appropriate  Insight:  Good  Engagement in Group:  Off Topic  Modes of Intervention:  Support  Summary of Progress/Problems:  Celso Amy 09/29/2014, 2:07 PM

## 2014-09-29 NOTE — Progress Notes (Signed)
Patient discharged to hotel via cab. Instructions reviewed. Patient verbalized understanding of meds and follow up care. Denies SI/HI/AVH. Belongings returned.

## 2014-09-29 NOTE — Progress Notes (Signed)
D: Pt denies SI/HI/AVH. Pt is pleasant and cooperative. Pt focused on leaving, pt concerned about his dog. Pt observed interacting on the unit with peers .   A: Pt was offered support and encouragement. Pt was given scheduled medications. Pt was encourage to attend groups. Q 15 minute checks were done for safety.   R:Pt attends groups and interacts well with peers and staff. Pt is taking medication. Pt has no complaints at this time.Pt receptive to treatment and safety maintained on unit.

## 2014-09-29 NOTE — Plan of Care (Signed)
Problem: Alteration in mood; excessive anxiety as evidenced by: Goal: LTG-Patient's behavior demonstrates decreased anxiety (Patient's behavior demonstrates anxiety and he/she is utilizing learned coping skills to deal with anxiety-producing situations)  Outcome: Progressing Pt stated he was doing fine, not that anxious tonight  Problem: Ineffective individual coping Goal: LTG: Patient will report a decrease in negative feelings Outcome: Progressing Pt stated he ws feeling ok tonight

## 2014-09-29 NOTE — Progress Notes (Signed)
  [facility] Adult Case Management Discharge Plan :  Will you be returning to the same living situation after discharge:  No. will be provided with hotel for 1 week till CST can arrange other housing At discharge, do you have transportation home?: No. Will be provided cab voucher Do you have the ability to pay for your medications: Yes,  patient has insurance  Release of information consent forms completed and in the chart;  Patient's signature needed at discharge.  Patient to Follow up at: Follow-up Information    Follow up with [facility] [place].   Why:  Hospital Discharge Follow up; CST is notified of patient discharge and will meet patient at his residence   Contact information:   [address] [place]  P [phone]  Fax [phone]      Patient denies SI/HI: Yes,  patient denies SI/HI    Safety Planning and Suicide Prevention discussed: Yes,  SPE discussed with patient and given SPE brochure and discussed with CST  Have you used any form of tobacco in the last 30 days? (Cigarettes, Smokeless Tobacco, Cigars, and/or Pipes): Yes  Has patient been referred to the Quitline?: Patient refused referral  Carmell Austria T 09/29/2014, 12:26 PM
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
## 2014-09-29 NOTE — Clinical Social Work Note (Signed)
CSW met with patient and obtained signed consent for his Parole Officer [employer]. Parole Officer [employer] met with patient on the unit. After leaving the unit, [employer] asked which motel patient would be going to and CSW explained that would be up to patient to share. CSW allowed patient to call Officer McCoy and patient shared the motel and address [facility] in [place] where he will be staying and that he would also be seen by [facility]. Patient was very upset that he would not have his dog with him but CSW shared with patient that this living arrangement is temporary.

## 2014-09-29 NOTE — Progress Notes (Signed)
  [facility] Adult Case Management Discharge Plan :  Will you be returning to the same living situation after discharge:  No. [employer] of [facility] funded patient a 1 week motel at [facility] in [place], [place] At discharge, do you have transportation home?: No. Patient is provided with a cab voucher with transportation Do you have the ability to pay for your medications: Yes,  patient has insurance  Release of information consent forms completed and in the chart;  Patient's signature needed at discharge.  Patient to Follow up at: Follow-up Information    Follow up with [facility] [place].   Why:  Hospital Discharge Follow up; CST is notified of patient discharge and will meet patient at his residence   Contact information:   [address] [place]  P [phone]  Fax [phone]      Patient denies SI/HI: Yes,  patient denies SI/HI    Safety Planning and Suicide Prevention discussed: Yes,  SPE discussed with patient and his [facility] Jochelle [phone]  Have you used any form of tobacco in the last 30 days? (Cigarettes, Smokeless Tobacco, Cigars, and/or Pipes): Yes  Has patient been referred to the Quitline?: Patient refused referral  Carmell Austria T 09/29/2014, 2:38 PM

## 2014-11-06 ENCOUNTER — Inpatient Hospital Stay
Admission: EM | Admit: 2014-11-06 | Discharge: 2014-11-08 | DRG: 684 | Disposition: A | Discharge status: Home / Self Care | Payer: Medicaid Other | Attending: Internal Medicine | Admitting: Internal Medicine

## 2014-11-06 ENCOUNTER — Encounter: Payer: Self-pay | Admitting: Emergency Medicine

## 2014-11-06 DIAGNOSIS — I10 Essential (primary) hypertension: Secondary | ICD-10-CM | POA: Diagnosis present

## 2014-11-06 DIAGNOSIS — Z7951 Long term (current) use of inhaled steroids: Secondary | ICD-10-CM

## 2014-11-06 DIAGNOSIS — N170 Acute kidney failure with tubular necrosis: Principal | ICD-10-CM | POA: Diagnosis present

## 2014-11-06 DIAGNOSIS — E860 Dehydration: Secondary | ICD-10-CM

## 2014-11-06 DIAGNOSIS — I959 Hypotension, unspecified: Secondary | ICD-10-CM | POA: Diagnosis present

## 2014-11-06 DIAGNOSIS — F1210 Cannabis abuse, uncomplicated: Secondary | ICD-10-CM | POA: Diagnosis present

## 2014-11-06 DIAGNOSIS — Z888 Allergy status to other drugs, medicaments and biological substances status: Secondary | ICD-10-CM | POA: Diagnosis not present

## 2014-11-06 DIAGNOSIS — F17210 Nicotine dependence, cigarettes, uncomplicated: Secondary | ICD-10-CM | POA: Diagnosis present

## 2014-11-06 DIAGNOSIS — K580 Irritable bowel syndrome with diarrhea: Secondary | ICD-10-CM | POA: Diagnosis present

## 2014-11-06 DIAGNOSIS — F329 Major depressive disorder, single episode, unspecified: Secondary | ICD-10-CM | POA: Diagnosis present

## 2014-11-06 DIAGNOSIS — J449 Chronic obstructive pulmonary disease, unspecified: Secondary | ICD-10-CM | POA: Diagnosis present

## 2014-11-06 DIAGNOSIS — Z716 Tobacco abuse counseling: Secondary | ICD-10-CM | POA: Diagnosis present

## 2014-11-06 DIAGNOSIS — Z9889 Other specified postprocedural states: Secondary | ICD-10-CM | POA: Diagnosis not present

## 2014-11-06 DIAGNOSIS — Z79899 Other long term (current) drug therapy: Secondary | ICD-10-CM

## 2014-11-06 DIAGNOSIS — E876 Hypokalemia: Secondary | ICD-10-CM | POA: Diagnosis present

## 2014-11-06 DIAGNOSIS — E039 Hypothyroidism, unspecified: Secondary | ICD-10-CM | POA: Diagnosis present

## 2014-11-06 DIAGNOSIS — N179 Acute kidney failure, unspecified: Secondary | ICD-10-CM

## 2014-11-06 HISTORY — DX: Irritable bowel syndrome, unspecified: K58.9

## 2014-11-06 HISTORY — DX: Hypokalemia: E87.6

## 2014-11-06 HISTORY — DX: Acute kidney failure, unspecified: N17.9

## 2014-11-06 HISTORY — DX: Hypotension, unspecified: I95.9

## 2014-11-06 LAB — BASIC METABOLIC PANEL
ANION GAP: 13 (ref 5–15)
ANION GAP: 6 (ref 5–15)
BUN: 40 mg/dL — AB (ref 6–20)
BUN: 37 mg/dL — ABNORMAL HIGH (ref 6–20)
CHLORIDE: 102 mmol/L (ref 101–111)
CHLORIDE: 107 mmol/L (ref 101–111)
CO2: 23 mmol/L (ref 22–32)
CO2: 26 mmol/L (ref 22–32)
CREATININE: 2.83 mg/dL — AB (ref 0.61–1.24)
Calcium: 10 mg/dL (ref 8.9–10.3)
Calcium: 9 mg/dL (ref 8.9–10.3)
Creatinine, Ser: 3.53 mg/dL — ABNORMAL HIGH (ref 0.61–1.24)
GFR calc Af Amer: 21 mL/min — ABNORMAL LOW (ref >60)
GFR calc non Af Amer: 18 mL/min — ABNORMAL LOW (ref >60)
GFR calc non Af Amer: 24 mL/min — ABNORMAL LOW (ref >60)
GFR, EST AFRICAN AMERICAN: 28 mL/min — AB (ref >60)
GLUCOSE: 96 mg/dL (ref 65–99)
Glucose, Bld: 106 mg/dL — ABNORMAL HIGH (ref 65–99)
POTASSIUM: 3.1 mmol/L — AB (ref 3.5–5.1)
Potassium: 3.1 mmol/L — ABNORMAL LOW (ref 3.5–5.1)
Sodium: 138 mmol/L (ref 135–145)
Sodium: 139 mmol/L (ref 135–145)

## 2014-11-06 LAB — URINALYSIS COMPLETE WITH MICROSCOPIC (ARMC ONLY)
BILIRUBIN URINE: NEGATIVE
Glucose, UA: NEGATIVE mg/dL
Hgb urine dipstick: NEGATIVE
KETONES UR: NEGATIVE mg/dL
LEUKOCYTES UA: NEGATIVE
NITRITE: NEGATIVE
Protein, ur: 30 mg/dL — AB
Specific Gravity, Urine: 1.023 (ref 1.005–1.030)
pH: 5 (ref 5.0–8.0)

## 2014-11-06 LAB — CBC
HEMATOCRIT: 44 % (ref 40.0–52.0)
HEMOGLOBIN: 14.5 g/dL (ref 13.0–18.0)
MCH: 30.1 pg (ref 26.0–34.0)
MCHC: 32.9 g/dL (ref 32.0–36.0)
MCV: 91.4 fL (ref 80.0–100.0)
PLATELETS: 181 10*3/uL (ref 150–440)
RBC: 4.81 MIL/uL (ref 4.40–5.90)
RDW: 14.6 % — ABNORMAL HIGH (ref 11.5–14.5)
WBC: 11.6 10*3/uL — ABNORMAL HIGH (ref 3.8–10.6)

## 2014-11-06 LAB — CK: CK TOTAL: 528 U/L — AB (ref 49–397)

## 2014-11-06 LAB — PHOSPHORUS: Phosphorus: 3.4 mg/dL (ref 2.5–4.6)

## 2014-11-06 LAB — MAGNESIUM: MAGNESIUM: 2 mg/dL (ref 1.7–2.4)

## 2014-11-06 MED ORDER — VENLAFAXINE HCL 25 MG PO TABS
50.0000 mg | ORAL_TABLET | Freq: Two times a day (BID) | ORAL | Status: DC
  Administered 2014-11-07 – 2014-11-08 (×3): 50 mg via ORAL
  Filled 2014-11-06 (×3): qty 2

## 2014-11-06 MED ORDER — QUETIAPINE FUMARATE 25 MG PO TABS
25.0000 mg | ORAL_TABLET | Freq: Three times a day (TID) | ORAL | Status: DC
  Administered 2014-11-07 – 2014-11-08 (×4): 25 mg via ORAL
  Filled 2014-11-06 (×4): qty 1

## 2014-11-06 MED ORDER — SODIUM CHLORIDE 0.9 % IV SOLN
INTRAVENOUS | Status: DC
  Administered 2014-11-06 – 2014-11-07 (×4): via INTRAVENOUS

## 2014-11-06 MED ORDER — ONDANSETRON HCL 4 MG/2ML IJ SOLN: 4.0000 mg | Freq: Four times a day (QID) | INTRAMUSCULAR | Status: DC | PRN

## 2014-11-06 MED ORDER — POTASSIUM CHLORIDE CRYS ER 20 MEQ PO TBCR
60.0000 meq | EXTENDED_RELEASE_TABLET | Freq: Once | ORAL | Status: AC
  Administered 2014-11-06: 60 meq via ORAL
  Filled 2014-11-06: qty 3

## 2014-11-06 MED ORDER — AMITRIPTYLINE HCL 50 MG PO TABS
50.0000 mg | ORAL_TABLET | Freq: Every day | ORAL | Status: DC
  Administered 2014-11-06 – 2014-11-07 (×2): 50 mg via ORAL
  Filled 2014-11-06 (×3): qty 1

## 2014-11-06 MED ORDER — ACETAMINOPHEN 325 MG PO TABS: 650.0000 mg | ORAL_TABLET | Freq: Four times a day (QID) | ORAL | Status: DC | PRN

## 2014-11-06 MED ORDER — ONDANSETRON HCL 4 MG PO TABS: 4.0000 mg | ORAL_TABLET | Freq: Four times a day (QID) | ORAL | Status: DC | PRN

## 2014-11-06 MED ORDER — SODIUM CHLORIDE 0.9 % IV BOLUS (SEPSIS)
1000.0000 mL | Freq: Once | INTRAVENOUS | Status: AC
  Administered 2014-11-06: 1000 mL via INTRAVENOUS

## 2014-11-06 MED ORDER — LEVOTHYROXINE SODIUM 75 MCG PO TABS
75.0000 ug | ORAL_TABLET | Freq: Every day | ORAL | Status: DC
  Administered 2014-11-07 – 2014-11-08 (×2): 75 ug via ORAL
  Filled 2014-11-06 (×3): qty 1

## 2014-11-06 MED ORDER — ALBUTEROL SULFATE (2.5 MG/3ML) 0.083% IN NEBU: 2.5000 mg | INHALATION_SOLUTION | RESPIRATORY_TRACT | Status: DC | PRN

## 2014-11-06 MED ORDER — HEPARIN SODIUM (PORCINE) 5000 UNIT/ML IJ SOLN
5000.0000 [IU] | Freq: Three times a day (TID) | INTRAMUSCULAR | Status: DC
  Administered 2014-11-06 – 2014-11-08 (×6): 5000 [IU] via SUBCUTANEOUS
  Filled 2014-11-06 (×6): qty 1

## 2014-11-06 MED ORDER — ALBUTEROL SULFATE HFA 108 (90 BASE) MCG/ACT IN AERS: 2.0000 | INHALATION_SPRAY | RESPIRATORY_TRACT | Status: DC | PRN

## 2014-11-06 MED ORDER — NICOTINE 14 MG/24HR TD PT24
14.0000 mg | MEDICATED_PATCH | Freq: Every day | TRANSDERMAL | Status: DC
  Administered 2014-11-06 – 2014-11-08 (×3): 14 mg via TRANSDERMAL
  Filled 2014-11-06 (×3): qty 1

## 2014-11-06 MED ORDER — HYOSCYAMINE SULFATE 0.125 MG PO TABS
0.1250 mg | ORAL_TABLET | ORAL | Status: DC | PRN
  Filled 2014-11-06: qty 1

## 2014-11-06 MED ORDER — QUETIAPINE FUMARATE 100 MG PO TABS
300.0000 mg | ORAL_TABLET | Freq: Every day | ORAL | Status: DC
  Administered 2014-11-06 – 2014-11-07 (×2): 300 mg via ORAL
  Filled 2014-11-06 (×2): qty 3

## 2014-11-06 MED ORDER — PANTOPRAZOLE SODIUM 40 MG PO TBEC
40.0000 mg | DELAYED_RELEASE_TABLET | Freq: Every day | ORAL | Status: DC
  Administered 2014-11-06 – 2014-11-08 (×3): 40 mg via ORAL
  Filled 2014-11-06 (×3): qty 1

## 2014-11-06 MED ORDER — ACETAMINOPHEN 650 MG RE SUPP: 650.0000 mg | Freq: Four times a day (QID) | RECTAL | Status: DC | PRN

## 2014-11-06 MED ORDER — DOCUSATE SODIUM 100 MG PO CAPS: 100.0000 mg | ORAL_CAPSULE | Freq: Every day | ORAL | Status: DC

## 2014-11-06 NOTE — ED Notes (Signed)
Blood work sent to the lab

## 2014-11-06 NOTE — H&P (Signed)
[facility] [place] NAME: Christopher Mason    MR#:  [id number]  DATE OF BIRTH:  06/29/1960  DATE OF ADMISSION:  11/06/2014  PRIMARY CARE PHYSICIAN: No PCP Per Patient   REQUESTING/REFERRING PHYSICIAN: Carrie Mew, MD  CHIEF COMPLAINT:   Chief Complaint  Patient presents with  . Dizziness   dizziness and generalized weakness today.  HISTORY OF PRESENT ILLNESS:  Christopher Mason  is a 54 y.o. male with a known history of hypertension, COPD and hypothyroidism. he's recently been in jail awaiting trial for some minor charges and was released 2 days ago. He has been spending a lot of time walking outside for the past several days. He didn't drink or eat well for the past 2 days. He feels dizzy and generalized weakness. In addition, he complained of the dark tea-colored urine without hematuria. He denies any syncope or loss of consciousness. He was noticed to have hypotension and increasing creatinine at 3.53 in ED.  PAST MEDICAL HISTORY:   Past Medical History  Diagnosis Date  . Depression   . Hypertension   . Hypothyroid   . COPD (chronic obstructive pulmonary disease)     PAST SURGICAL HISTORY:   Past Surgical History  Procedure Laterality Date  . Back surgery    . Rotator cuff repair      2007    SOCIAL HISTORY:   History  Substance Use Topics  . Smoking status: Current Every Day Smoker -- 1.00 packs/day    Types: Cigarettes  . Smokeless tobacco: Never Used  . Alcohol Use: No    FAMILY HISTORY:  No family history on file.  DRUG ALLERGIES:   Allergies  Allergen Reactions  . Aspirin Nausea And Vomiting and Swelling    REVIEW OF SYSTEMS:  CONSTITUTIONAL: No fever, generalized weakness.  EYES: No blurred or double vision.  EARS, NOSE, AND THROAT: No tinnitus or ear pain.  RESPIRATORY: No cough, shortness of breath, wheezing or hemoptysis.  CARDIOVASCULAR: No chest pain, orthopnea, edema.  GASTROINTESTINAL: Mild  nausea, no vomiting, diarrhea, mild abdominal pain.  GENITOURINARY: No dysuria, hematuria. But has dark tea-colored urine. ENDOCRINE: No polyuria, nocturia,  HEMATOLOGY: No anemia, easy bruising or bleeding SKIN: No rash or lesion. MUSCULOSKELETAL: No joint pain or arthritis.   NEUROLOGIC: No tingling, numbness, weakness.  PSYCHIATRY: No anxiety or depression.   MEDICATIONS AT HOME:   Prior to Admission medications   Medication Sig Start Date End Date Taking? Authorizing Provider  albuterol (PROVENTIL HFA;VENTOLIN HFA) 108 (90 BASE) MCG/ACT inhaler Inhale 2 puffs into the lungs every 4 (four) hours as needed for wheezing or shortness of breath. 09/25/14  Yes Jolanta B Pucilowska, MD  amitriptyline (ELAVIL) 50 MG tablet Take 1 tablet (50 mg total) by mouth at bedtime. 09/29/14  Yes Jolanta B Pucilowska, MD  docusate sodium (COLACE) 100 MG capsule Take 1 capsule (100 mg total) by mouth daily. 09/25/14  Yes Jolanta B Pucilowska, MD  hyoscyamine (LEVSIN, ANASPAZ) 0.125 MG tablet Take 1 tablet (0.125 mg total) by mouth every 4 (four) hours as needed for bladder spasms. 09/25/14  Yes Jolanta B Pucilowska, MD  levothyroxine (SYNTHROID, LEVOTHROID) 75 MCG tablet Take 1 tablet (75 mcg total) by mouth daily. 09/29/14  Yes Jolanta B Pucilowska, MD  lisinopril (PRINIVIL,ZESTRIL) 40 MG tablet Take 1 tablet (40 mg total) by mouth daily. 09/29/14  Yes Jolanta B Pucilowska, MD  omeprazole (PRILOSEC) 20 MG capsule Take 1 capsule (20 mg total) by mouth  daily. 09/29/14  Yes Jolanta B Pucilowska, MD  pravastatin (PRAVACHOL) 10 MG tablet Take 1 tablet (10 mg total) by mouth at bedtime. 09/29/14  Yes Jolanta B Pucilowska, MD  QUEtiapine (SEROQUEL) 25 MG tablet Take 1 tablet (25 mg total) by mouth 3 (three) times daily before meals. 09/25/14  Yes Jolanta B Pucilowska, MD  QUEtiapine (SEROQUEL) 300 MG tablet Take 1 tablet (300 mg total) by mouth at bedtime. 09/29/14  Yes Jolanta B Pucilowska, MD  venlafaxine (EFFEXOR) 50 MG tablet Take  1 tablet (50 mg total) by mouth 2 (two) times daily with a meal. With breakfats and lunch. Patient taking differently: Take 50 mg by mouth 2 (two) times daily with a meal.  09/29/14  Yes Jolanta B Pucilowska, MD      VITAL SIGNS:  Blood pressure 99/84, pulse 82, temperature 97.9 F (36.6 C), temperature source Oral, resp. rate 20, height 5\' 7"  (1.702 m), weight 81.647 kg (180 lb), SpO2 98 %.  PHYSICAL EXAMINATION:  GENERAL:  54 y.o.-year-old patient lying in the bed with no acute distress.  EYES: Pupils equal, round, reactive to light and accommodation. No scleral icterus. Extraocular muscles intact.  HEENT: Head atraumatic, normocephalic. Oropharynx and nasopharynx clear. Moist oral mucosa. NECK:  Supple, no jugular venous distention. No thyroid enlargement, no tenderness.  LUNGS: Normal breath sounds bilaterally, no wheezing, rales,rhonchi or crepitation. No use of accessory muscles of respiration.  CARDIOVASCULAR: S1, S2 normal. No murmurs, rubs, or gallops.  ABDOMEN: Soft, nontender, nondistended. Bowel sounds present. No organomegaly or mass.  EXTREMITIES: No pedal edema, cyanosis, or clubbing.  NEUROLOGIC: Cranial nerves II through XII are intact. Muscle strength 5/5 in all extremities. Sensation intact. Gait not checked.  PSYCHIATRIC: The patient is alert and oriented x 3.  SKIN: No obvious rash, lesion, or ulcer.   LABORATORY PANEL:   CBC  Recent Labs Lab 11/06/14 1243  WBC 11.6*  HGB 14.5  HCT 44.0  PLT 181   ------------------------------------------------------------------------------------------------------------------  Chemistries   Recent Labs Lab 11/06/14 1529  NA 139  K 3.1*  CL 107  CO2 26  GLUCOSE 96  BUN 37*  CREATININE 2.83*  CALCIUM 9.0   ------------------------------------------------------------------------------------------------------------------  Cardiac Enzymes No results for input(s): TROPONINI in the last 168  hours. ------------------------------------------------------------------------------------------------------------------  RADIOLOGY:  No results found.  EKG:   Orders placed or performed during the hospital encounter of 11/06/14  . ED EKG  . ED EKG    IMPRESSION AND PLAN:   Acute renal failure Hypotension Hypokalemia Tobacco abuse Drug abuse (marijuana) COPD  The patient will be admitted to medical floor. For hypotension, patient received normal saline bolus in the ED, but blood pressure is still low at 85/52. I will give another 1 L normal saline bolus and continue normal saline IV. For acute renal failure, possible due to ATN secondary to hypotension. I will hold lisinopril and Pravachol, continue IV fluid support, get renal ultrasound and a follow-up CK and BMP. I will give potassium supplement, check magnesium level and BMP. Continue patient home nebulizer treatment for COPD. Smoking cessation was counseled for 4 minutes and I will give nicotine patch.   All the records are reviewed and case discussed with ED provider. Management plans discussed with the patient, family and they are in agreement.  CODE STATUS: Full code  TOTAL CRITICAL TIME TAKING CARE OF THIS PATIENT: 58  minutes.   Demetrios Loll M.D on 11/06/2014 at 4:41 PM  Between 7am to 6pm - Pager - [phone]  After  6pm go to [email or web address] - password [email or web address] [facility]  [facility]  Office  [phone]  CC: Primary care physician; No PCP Per Patient

## 2014-11-06 NOTE — ED Provider Notes (Signed)
  Physical Exam  BP 99/84 mmHg  Pulse 82  Temp(Src) 97.9 F (36.6 C) (Oral)  Resp 20  Ht 5\' 7"  (1.702 m)  Wt 180 lb (81.647 kg)  BMI 28.19 kg/m2  SpO2 98% ----------------------------------------- 4:31 PM on 11/06/2014 -----------------------------------------   Physical Exam Patient is resting comfortably at this time. ED Course  Procedures  MDM Patient creatinine with mild improvement but still at 2.8. We'll admit for further treatment for acute kidney failure. Signed out to Dr. Bridgett Larsson.      Orbie Pyo, MD 11/06/14 [phone]

## 2014-11-06 NOTE — ED Provider Notes (Signed)
[facility] Emergency Department Provider Note  ____________________________________________  Time seen: 1:00 PM  I have reviewed the triage vital signs and the nursing notes.   HISTORY  Chief Complaint Dizziness    HPI Christopher Mason is a 54 y.o. male who comes to the ED today with dizziness and lightheadedness today. He notes that he's recently been in jail awaiting trial for some minor charges and was released 2 days ago. After that he hitchhiked home to [place] where he's been staying. He is been spending a lot of time walking and being outside were ambient temperatures have been in the 90 range over the past several days with lots of son. He had very little to eat or drink yesterday and today. No chest pain shortness of breath fever chills. No trauma trauma recent injury or muscle aches. Has not been immobilized or fallen and been on the floor for a prolonged period of time.     Past Medical History  Diagnosis Date  . Depression   . Hypertension   . Hypothyroid   . COPD (chronic obstructive pulmonary disease)     Patient Active Problem List   Diagnosis Date Noted  . Cannabis use disorder, severe, dependence 09/24/2014  . Undifferentiated schizophrenia   . Hypothyroidism 09/17/2014  . GERD (gastroesophageal reflux disease) 09/17/2014  . Tobacco use disorder 09/17/2014  . Non compliance w medication regimen 09/16/2014  . Asthma 09/16/2014  . HTN (hypertension) 09/16/2014  . Tardive dyskinesia 09/16/2014    Past Surgical History  Procedure Laterality Date  . Back surgery    . Rotator cuff repair      2007    Current Outpatient Rx  Name  Route  Sig  Dispense  Refill  . albuterol (PROVENTIL HFA;VENTOLIN HFA) 108 (90 BASE) MCG/ACT inhaler   Inhalation   Inhale 2 puffs into the lungs every 4 (four) hours as needed for wheezing or shortness of breath.   1 each   0   . amitriptyline (ELAVIL) 50 MG tablet   Oral   Take 1 tablet (50 mg  total) by mouth at bedtime.   30 tablet   0   . docusate sodium (COLACE) 100 MG capsule   Oral   Take 1 capsule (100 mg total) by mouth daily.   30 capsule   0   . hyoscyamine (LEVSIN, ANASPAZ) 0.125 MG tablet   Oral   Take 1 tablet (0.125 mg total) by mouth every 4 (four) hours as needed for bladder spasms.   30 tablet   0   . levothyroxine (SYNTHROID, LEVOTHROID) 75 MCG tablet   Oral   Take 1 tablet (75 mcg total) by mouth daily.   30 tablet   0   . lisinopril (PRINIVIL,ZESTRIL) 40 MG tablet   Oral   Take 1 tablet (40 mg total) by mouth daily.   30 tablet   0   . omeprazole (PRILOSEC) 20 MG capsule   Oral   Take 1 capsule (20 mg total) by mouth daily.   30 capsule   0   . pravastatin (PRAVACHOL) 10 MG tablet   Oral   Take 1 tablet (10 mg total) by mouth at bedtime.   30 tablet   0   . QUEtiapine (SEROQUEL) 25 MG tablet   Oral   Take 1 tablet (25 mg total) by mouth 3 (three) times daily before meals.   90 tablet   0   . QUEtiapine (SEROQUEL) 300 MG tablet  Oral   Take 1 tablet (300 mg total) by mouth at bedtime.   30 tablet   0   . venlafaxine (EFFEXOR) 50 MG tablet   Oral   Take 1 tablet (50 mg total) by mouth 2 (two) times daily with a meal. With breakfats and lunch.   60 tablet   0     Allergies Aspirin  No family history on file.  Social History History  Substance Use Topics  . Smoking status: Current Every Day Smoker -- 1.00 packs/day    Types: Cigarettes  . Smokeless tobacco: Never Used  . Alcohol Use: No    Review of Systems  Constitutional: No fever or chills. No weight changes Eyes:No blurry vision or double vision.  ENT: No sore throat. Cardiovascular: No chest pain. Respiratory: No dyspnea or cough. Gastrointestinal: Negative for abdominal pain, vomiting and diarrhea.  No BRBPR or melena. Genitourinary: Negative for dysuria, urinary retention, bloody urine, or difficulty urinating. Musculoskeletal: Negative for back pain.  No joint swelling or pain. Skin: Negative for rash. Neurological: Negative for headaches, focal weakness or numbness. Psychiatric:No anxiety or depression.   Endocrine:No hot/cold intolerance, changes in energy, or sleep difficulty.  10-point ROS otherwise negative.  ____________________________________________   PHYSICAL EXAM:  VITAL SIGNS: ED Triage Vitals  Enc Vitals Group     BP 11/06/14 1239 79/53 mmHg     Pulse Rate 11/06/14 1239 82     Resp 11/06/14 1239 11     Temp 11/06/14 1239 97.9 F (36.6 C)     Temp Source 11/06/14 1239 Oral     SpO2 11/06/14 1239 97 %     Weight 11/06/14 1239 180 lb (81.647 kg)     Height 11/06/14 1239 5\' 7"  (1.702 m)     Head Cir --      Peak Flow --      Pain Score 11/06/14 1232 8     Pain Loc --      Pain Edu? --      Excl. in [place]? --      Constitutional: Alert and oriented. Well appearing and in no distress. Talkative, cooperative in good spirits. Showing off pictures of his dog. Eyes: No scleral icterus. No conjunctival pallor. PERRL. EOMI ENT   Head: Normocephalic and atraumatic.   Nose: No congestion/rhinnorhea. No septal hematoma   Mouth/Throat: Dry mucous membranes, no pharyngeal erythema. No peritonsillar mass. No uvula shift.   Neck: No stridor. No SubQ emphysema. No meningismus. Hematological/Lymphatic/Immunilogical: No cervical lymphadenopathy. Cardiovascular: RRR. Normal and symmetric distal pulses are present in all extremities. No murmurs, rubs, or gallops. Respiratory: Normal respiratory effort without tachypnea nor retractions. Breath sounds are clear and equal bilaterally. No wheezes/rales/rhonchi. Gastrointestinal: Soft and nontender. No distention. There is no CVA tenderness.  No rebound, rigidity, or guarding. Genitourinary: deferred Musculoskeletal: Nontender with normal range of motion in all extremities. No joint effusions.  No lower extremity tenderness.  No edema. Neurologic:   Normal speech and  language.  CN 2-10 normal. Motor grossly intact. No pronator drift.  Normal gait. No gross focal neurologic deficits are appreciated.  Skin:  Skin is warm, dry and intact. No rash noted.  No petechiae, purpura, or bullae. Psychiatric: Mood and affect are normal. Speech and behavior are normal. Patient exhibits appropriate insight and judgment.  ____________________________________________    LABS (pertinent positives/negatives) (all labs ordered are listed, but only abnormal results are displayed) Labs Reviewed  BASIC METABOLIC PANEL - Abnormal; Notable for the following:  Potassium 3.1 (*)    Glucose, Bld 106 (*)    BUN 40 (*)    Creatinine, Ser 3.53 (*)    GFR calc non Af Amer 18 (*)    GFR calc Af Amer 21 (*)    All other components within normal limits  CBC - Abnormal; Notable for the following:    WBC 11.6 (*)    RDW 14.6 (*)    All other components within normal limits  URINALYSIS COMPLETEWITH MICROSCOPIC ([facility] ONLY) - Abnormal; Notable for the following:    Color, Urine AMBER (*)    APPearance HAZY (*)    Protein, ur 30 (*)    Bacteria, UA RARE (*)    Squamous Epithelial / LPF 0-5 (*)    All other components within normal limits  CK - Abnormal; Notable for the following:    Total CK 528 (*)    All other components within normal limits  BASIC METABOLIC PANEL   ____________________________________________   EKG  Interpreted by me Junctional rhythm rate of 94, normal axis intervals QRS and ST segments and T waves.  ____________________________________________    RADIOLOGY    ____________________________________________   PROCEDURES  ____________________________________________   INITIAL IMPRESSION / ASSESSMENT AND PLAN / ED COURSE  Pertinent labs & imaging results that were available during my care of the patient were reviewed by me and considered in my medical decision making (see chart for details).  Patient resents with a clear history for  dehydration. His fairly significant in that his creatinine has elevated from normal to 3.5 today. CK is only minorly elevated so I do not think this is rhabdomyolysis, and his history is not consistent with rhabdomyolysis. He is very well-appearing nontoxic no acute distress good spirits, tolerating oral intake. We will rehydrate him with 2 L of IV fluids and recheck his creatinine. If improved, he should be stable for discharge home as he will be out of continue hydrating himself at home. Care of the patient will be signed out to Dr. Dineen Kid pending repeat chemistry.  ____________________________________________   FINAL CLINICAL IMPRESSION(S) / ED DIAGNOSES  Final diagnoses:  Dehydration      Carrie Mew, MD 11/06/14 1530

## 2014-11-06 NOTE — ED Notes (Signed)
Pt arrived via EMS and has been living at a hotel.  Pt states he started feeling dizzy and lightheaded about 2 days ago.  Pt states he hitch-hiked from [place] yesterday and currently lives in [place].  EMS found patient to have blood pressure of 60/40 and pulse from 101 to 102. Pt also c/o R shoulder pain down into the muscle.  Recently lived in a group home.

## 2014-11-07 ENCOUNTER — Inpatient Hospital Stay: Payer: Medicaid Other

## 2014-11-07 LAB — BASIC METABOLIC PANEL
ANION GAP: 2 — AB (ref 5–15)
BUN: 28 mg/dL — ABNORMAL HIGH (ref 6–20)
CHLORIDE: 116 mmol/L — AB (ref 101–111)
CO2: 23 mmol/L (ref 22–32)
Calcium: 8.3 mg/dL — ABNORMAL LOW (ref 8.9–10.3)
Creatinine, Ser: 1.35 mg/dL — ABNORMAL HIGH (ref 0.61–1.24)
GFR calc Af Amer: >60 mL/min (ref >60)
GFR calc non Af Amer: 58 mL/min — ABNORMAL LOW (ref >60)
Glucose, Bld: 92 mg/dL (ref 65–99)
POTASSIUM: 4.2 mmol/L (ref 3.5–5.1)
SODIUM: 141 mmol/L (ref 135–145)

## 2014-11-07 LAB — C DIFFICILE QUICK SCREEN W PCR REFLEX
C Diff antigen: NEGATIVE
C Diff interpretation: NEGATIVE
C Diff toxin: NEGATIVE

## 2014-11-07 MED ORDER — ALUM & MAG HYDROXIDE-SIMETH 200-200-20 MG/5ML PO SUSP
30.0000 mL | ORAL | Status: DC | PRN
  Administered 2014-11-07: 30 mL via ORAL
  Filled 2014-11-07: qty 30

## 2014-11-07 NOTE — Progress Notes (Signed)
[place] at [place] NAME: Christopher Mason    MR#:  [id number]  DATE OF BIRTH:  03/11/1961  SUBJECTIVE:  CHIEF COMPLAINT:   Chief Complaint  Patient presents with  . Dizziness   no dizziness or weakness but had watery diarrhea 3 times this morning.  REVIEW OF SYSTEMS:  CONSTITUTIONAL: No fever, fatigue or weakness.  EYES: No blurred or double vision.  EARS, NOSE, AND THROAT: No tinnitus or ear pain.  RESPIRATORY: No cough, shortness of breath, wheezing or hemoptysis.  CARDIOVASCULAR: No chest pain, orthopnea, edema.  GASTROINTESTINAL: No nausea, vomiting, but has diarrhea, no abdominal pain.  GENITOURINARY: No dysuria, hematuria.  ENDOCRINE: No polyuria, nocturia,  HEMATOLOGY: No anemia, easy bruising or bleeding SKIN: No rash or lesion. MUSCULOSKELETAL: No joint pain or arthritis.   NEUROLOGIC: No tingling, numbness, weakness.  PSYCHIATRY: No anxiety or depression.   DRUG ALLERGIES:   Allergies  Allergen Reactions  . Aspirin Nausea And Vomiting and Swelling    VITALS:  Blood pressure 143/93, pulse 48, temperature 97.7 F (36.5 C), temperature source Oral, resp. rate 17, height 5\' 7"  (1.702 m), weight 81.647 kg (180 lb), SpO2 94 %.  PHYSICAL EXAMINATION:  GENERAL:  54 y.o.-year-old patient lying in the bed with no acute distress.  EYES: Pupils equal, round, reactive to light and accommodation. No scleral icterus. Extraocular muscles intact.  HEENT: Head atraumatic, normocephalic. Oropharynx and nasopharynx clear.  NECK:  Supple, no jugular venous distention. No thyroid enlargement, no tenderness.  LUNGS: Normal breath sounds bilaterally, no wheezing, rales,rhonchi or crepitation. No use of accessory muscles of respiration.  CARDIOVASCULAR: S1, S2 normal. No murmurs, rubs, or gallops.  ABDOMEN: Soft, nontender, nondistended. Bowel sounds present. No organomegaly or mass.  EXTREMITIES: No pedal edema, cyanosis, or clubbing.   NEUROLOGIC: Cranial nerves II through XII are intact. Muscle strength 5/5 in all extremities. Sensation intact. Gait not checked.  PSYCHIATRIC: The patient is alert and oriented x 3.  SKIN: No obvious rash, lesion, or ulcer.    LABORATORY PANEL:   CBC  Recent Labs Lab 11/06/14 1243  WBC 11.6*  HGB 14.5  HCT 44.0  PLT 181   ------------------------------------------------------------------------------------------------------------------  Chemistries   Recent Labs Lab 11/06/14 1757 11/07/14 0442  NA  --  141  K  --  4.2  CL  --  116*  CO2  --  23  GLUCOSE  --  92  BUN  --  28*  CREATININE  --  1.35*  CALCIUM  --  8.3*  MG 2.0  --    ------------------------------------------------------------------------------------------------------------------  Cardiac Enzymes No results for input(s): TROPONINI in the last 168 hours. ------------------------------------------------------------------------------------------------------------------  RADIOLOGY:  US Renal  11/07/2014   CLINICAL DATA:  Acute renal failure.  EXAM: RENAL / URINARY TRACT ULTRASOUND COMPLETE  COMPARISON:  None.  FINDINGS: Right Kidney:  Length: 11.3 cm. Echogenicity within normal limits. No mass or hydronephrosis visualized.  Left Kidney:  Length: 11.7 cm. Echogenicity within normal limits. No mass or hydronephrosis visualized.  Bladder:  Appears normal for degree of bladder distention.  IMPRESSION: Normal renal ultrasound   Electronically Signed   By: Lajean Manes M.D.   On: 11/07/2014 10:23    EKG:   Orders placed or performed during the hospital encounter of 11/06/14  . ED EKG  . ED EKG    ASSESSMENT AND PLAN:   Acute renal failure. possible due to ATN secondary to hypotension.  improving. hold lisinopril and Pravachol, continue  IV fluid support, normal renal ultrasound, follow-up BMP.  Hypotension. Improved. Hypokalemia. Improved. Tobacco abuse.  nicotine patch Drug abuse (marijuana).   COPD. Stable.  * Diarrhea. Check C diff.       All the records are reviewed and case discussed with Care Management/Social Workerr. Management plans discussed with the patient, family and they are in agreement.  CODE STATUS: full code.  TOTAL TIME TAKING CARE OF THIS PATIENT: 35 minutes.   POSSIBLE D/C IN 2 DAYS, DEPENDING ON CLINICAL CONDITION.   Demetrios Loll M.D on 11/07/2014 at 1:45 PM  Between 7am to 6pm - Pager - [phone]  After 6pm go to [email or web address] - password [email or web address] [facility]  [facility]  Office  [phone]  CC: Primary care physician; No PCP Per Patient

## 2014-11-07 NOTE — Progress Notes (Signed)
Initial Nutrition Assessment    INTERVENTION:   Meals and snacks: Cater to preferences   NUTRITION DIAGNOSIS:   Inadequate oral intake related to acute illness as evidenced by per patient/family report.    GOAL:   Patient will meet greater than or equal to 90% of their needs    MONITOR:    (Energy intake, Digestive system, )  REASON FOR ASSESSMENT:   Diagnosis    ASSESSMENT:   Pt admitted with dizziness, weakness, hypotension, ARF  Past Medical History  Diagnosis Date  . Depression   . Hypertension   . Hypothyroid   . COPD (chronic obstructive pulmonary disease)   . Irritable bowel syndrome (IBS)     Current Nutrition: ate 100% of lunch and breakfast this am. Lunch tray observed  Food/Nutrition-Related History: Pt reports 2 day prior to admission poor po intake, otherwise good appetite an intake   Medications: NS at 197ml/hr with KCL  Electrolyte/Renal Profile and Glucose Profile:   Recent Labs Lab 11/06/14 1243 11/06/14 1529 11/06/14 1757 11/07/14 0442  NA 138 139  --  141  K 3.1* 3.1*  --  4.2  CL 102 107  --  116*  CO2 23 26  --  23  BUN 40* 37*  --  28*  CREATININE 3.53* 2.83*  --  1.35*  CALCIUM 10.0 9.0  --  8.3*  MG  --   --  2.0  --   PHOS  --   --  3.4  --   GLUCOSE 106* 96  --  92     Last BM:7/16   Weight Change: stable weight per pt (noted wt gain per wt encounters   Diet Order:  Diet regular Room service appropriate?: Yes; Fluid consistency:: Thin Diet - low sodium heart healthy  Skin:  Reviewed, no issues   Height:   Ht Readings from Last 1 Encounters:  11/06/14 5\' 7"  (1.702 m)    Weight:   Wt Readings from Last 1 Encounters:  11/06/14 180 lb (81.647 kg)    Ideal Body Weight:     Wt Readings from Last 10 Encounters:  11/06/14 180 lb (81.647 kg)  09/17/14 150 lb (68.04 kg)  09/16/14 150 lb (68.04 kg)    BMI:  Body mass index is 28.19 kg/(m^2).   EDUCATION NEEDS:   No education needs identified at  this time  LOW Care Level Christopher Mason, [place], [place] (pager)

## 2014-11-08 LAB — BASIC METABOLIC PANEL
ANION GAP: 5 (ref 5–15)
BUN: 14 mg/dL (ref 6–20)
CALCIUM: 8.7 mg/dL — AB (ref 8.9–10.3)
CO2: 24 mmol/L (ref 22–32)
CREATININE: 0.79 mg/dL (ref 0.61–1.24)
Chloride: 111 mmol/L (ref 101–111)
Glucose, Bld: 101 mg/dL — ABNORMAL HIGH (ref 65–99)
Potassium: 3.8 mmol/L (ref 3.5–5.1)
Sodium: 140 mmol/L (ref 135–145)

## 2014-11-08 LAB — MAGNESIUM: Magnesium: 1.5 mg/dL — ABNORMAL LOW (ref 1.7–2.4)

## 2014-11-08 MED ORDER — MAGNESIUM SULFATE 2 GM/50ML IV SOLN
2.0000 g | Freq: Once | INTRAVENOUS | Status: DC
  Filled 2014-11-08: qty 50

## 2014-11-08 MED ORDER — LISINOPRIL 20 MG PO TABS
40.0000 mg | ORAL_TABLET | Freq: Once | ORAL | Status: AC
  Administered 2014-11-08: 40 mg via ORAL
  Filled 2014-11-08: qty 2

## 2014-11-08 MED ORDER — NICOTINE 14 MG/24HR TD PT24: 14.0000 mg | MEDICATED_PATCH | Freq: Every day | TRANSDERMAL | Status: DC

## 2014-11-08 NOTE — Care Management Note (Signed)
Case Management Note  Patient Details  Name: Christopher Mason MRN: [id number] Date of Birth: [birth year]  Subjective/Objective:        Referral made to [facility] today to please see Mr Zorn prior to discharge. May need to assist with transportation. This Probation officer spoke briefly with Mr [employer]. case worker Renard Matter, who reports that Mr Joiner is now followed for outpatient services by the [facility] team ph:[phone].  The ACT[facility] team will assist Mr Beckford with obtaining medications.          Action/Plan:   Expected Discharge Date:                  Expected Discharge Plan:     In-House Referral:     Discharge planning Services     Post Acute Care Choice:    Choice offered to:     DME Arranged:    DME Agency:     HH Arranged:    [place] Agency:     Status of Service:     Medicare Important Message Given:    Date Medicare IM Given:    Medicare IM give by:    Date Additional Medicare IM Given:    Additional Medicare Important Message give by:     If discussed at [place] of Stay Meetings, dates discussed:    Additional Comments:  Lorielle Boehning A, RN 11/08/2014, 9:02 AM

## 2014-11-08 NOTE — Care Management Note (Signed)
Case Management Note  Patient Details  Name: Christopher Mason MRN: [id number] Date of Birth: April 28, 1960  Subjective/Objective:           [name] is followed outpatient by Dr Lucien Mons, psychiatrist, at [facility]. Phone; [phone].  The [facility] team assists with meds, appointments, transportation, housing, food, clothing, any benefits such as SSDI and/or Medicaid/Medicare.       Action/Plan:   Expected Discharge Date:                  Expected Discharge Plan:     In-House Referral:     Discharge planning Services     Post Acute Care Choice:    Choice offered to:     DME Arranged:    DME Agency:     HH Arranged:    [place] Agency:     Status of Service:     Medicare Important Message Given:    Date Medicare IM Given:    Medicare IM give by:    Date Additional Medicare IM Given:    Additional Medicare Important Message give by:     If discussed at [place] of Stay Meetings, dates discussed:    Additional Comments:  Latalia Etzler A, RN 11/08/2014, 12:46 PM

## 2014-11-08 NOTE — Discharge Instructions (Signed)
Dehydration, Adult °Dehydration is when you lose more fluids from the body than you take in. Vital organs like the kidneys, brain, and heart cannot function without a proper amount of fluids and salt. Any loss of fluids from the body can cause dehydration.  °CAUSES  °· Vomiting. °· Diarrhea. °· Excessive sweating. °· Excessive urine output. °· Fever. °SYMPTOMS  °Mild dehydration °· Thirst. °· Dry lips. °· Slightly dry mouth. °Moderate dehydration °· Very dry mouth. °· Sunken eyes. °· Skin does not bounce back quickly when lightly pinched and released. °· Dark urine and decreased urine production. °· Decreased tear production. °· Headache. °Severe dehydration °· Very dry mouth. °· Extreme thirst. °· Rapid, weak pulse (more than 100 beats per minute at rest). °· Cold hands and feet. °· Not able to sweat in spite of heat and temperature. °· Rapid breathing. °· Blue lips. °· Confusion and lethargy. °· Difficulty being awakened. °· Minimal urine production. °· No tears. °DIAGNOSIS  °Your caregiver will diagnose dehydration based on your symptoms and your exam. Blood and urine tests will help confirm the diagnosis. The diagnostic evaluation should also identify the cause of dehydration. °TREATMENT  °Treatment of mild or moderate dehydration can often be done at home by increasing the amount of fluids that you drink. It is best to drink small amounts of fluid more often. Drinking too much at one time can make vomiting worse. Refer to the home care instructions below. °Severe dehydration needs to be treated at the hospital where you will probably be given intravenous (IV) fluids that contain water and electrolytes. °HOME CARE INSTRUCTIONS  °· Ask your caregiver about specific rehydration instructions. °· Drink enough fluids to keep your urine clear or pale yellow. °· Drink small amounts frequently if you have nausea and vomiting. °· Eat as you normally do. °· Avoid: °¨ Foods or drinks high in sugar. °¨ Carbonated  drinks. °¨ Juice. °¨ Extremely hot or cold fluids. °¨ Drinks with caffeine. °¨ Fatty, greasy foods. °¨ Alcohol. °¨ Tobacco. °¨ Overeating. °¨ Gelatin desserts. °· Wash your hands well to avoid spreading bacteria and viruses. °· Only take over-the-counter or prescription medicines for pain, discomfort, or fever as directed by your caregiver. °· Ask your caregiver if you should continue all prescribed and over-the-counter medicines. °· Keep all follow-up appointments with your caregiver. °SEEK MEDICAL CARE IF: °· You have abdominal pain and it increases or stays in one area (localizes). °· You have a rash, stiff neck, or severe headache. °· You are irritable, sleepy, or difficult to awaken. °· You are weak, dizzy, or extremely thirsty. °SEEK IMMEDIATE MEDICAL CARE IF:  °· You are unable to keep fluids down or you get worse despite treatment. °· You have frequent episodes of vomiting or diarrhea. °· You have blood or green matter (bile) in your vomit. °· You have blood in your stool or your stool looks black and tarry. °· You have not urinated in 6 to 8 hours, or you have only urinated a small amount of very dark urine. °· You have a fever. °· You faint. °MAKE SURE YOU:  °· Understand these instructions. °· Will watch your condition. °· Will get help right away if you are not doing well or get worse. °Document Released: 04/10/2005 Document Revised: 07/03/2011 Document Reviewed: 11/28/2010 °Exit[employer]® Patient Information ©2015 [employer]. This information is not intended to replace advice given to you by your health care provider. Make sure you discuss any questions you have with your health care   provider.  Rehydration, Adult Rehydration is the replacement of body fluids lost during dehydration. Dehydration is an extreme loss of body fluids to the point of body function impairment. There are many ways extreme fluid loss can occur, including vomiting, diarrhea, or excess sweating. Recovering from dehydration  requires replacing lost fluids, continuing to eat to maintain strength, and avoiding foods and beverages that may contribute to further fluid loss or may increase nausea. HOW TO REHYDRATE In most cases, rehydration involves the replacement of not only fluids but also carbohydrates and basic body salts. Rehydration with an oral rehydration solution is one way to replace essential nutrients lost through dehydration. An oral rehydration solution can be purchased at pharmacies, retail stores, and online. Premixed packets of powder that you combine with water to make a solution are also sold. You can prepare an oral rehydration solution at home by mixing the following ingredients together:    - tsp table salt.   tsp baking soda.   tsp salt substitute containing potassium chloride.  1 tablespoons sugar.  1 L (34 oz) of water. Be sure to use exact measurements. Including too much sugar can make diarrhea worse. Drink -1 cup (120-240 mL) of oral rehydration solution each time you have diarrhea or vomit. If drinking this amount makes your vomiting worse, try drinking smaller amounts more often. For example, drink 1-3 tsp every 5-10 minutes.  A general rule for staying hydrated is to drink 1-2 L of fluid per day. Talk to your caregiver about the specific amount you should be drinking each day. Drink enough fluids to keep your urine clear or pale yellow. EATING WHEN DEHYDRATED Even if you have had severe sweating or you are having diarrhea, do not stop eating. Many healthy items in a normal diet are okay to continue eating while recovering from dehydration. The following tips can help you to lessen nausea when you eat:  Ask someone else to prepare your food. Cooking smells may worsen nausea.  Eat in a well-ventilated room away from cooking smells.  Sit up when you eat. Avoid lying down until 1-2 hours after eating.  Eat small amounts when you eat.  Eat foods that are easy to digest. These include  soft, well-cooked, or mashed foods. FOODS AND BEVERAGES TO AVOID Avoid eating or drinking the following foods and beverages that may increase nausea or further loss of fluid:   Fruit juices with a high sugar content, such as concentrated juices.  Alcohol.  Beverages containing caffeine.  Carbonated drinks. They may cause a lot of gas.  Foods that may cause a lot of gas, such as cabbage, broccoli, and beans.  Fatty, greasy, and fried foods.  Spicy, very salty, and very sweet foods or drinks.  Foods or drinks that are very hot or very cold. Consume food or drinks at or near room temperature.  Foods that need a lot of chewing, such as raw vegetables.  Foods that are sticky or hard to swallow, such as peanut butter. Document Released: 07/03/2011 Document Revised: 01/03/2012 Document Reviewed: 07/03/2011 [facility] Patient Information 2015 [place], [place]. This information is not intended to replace advice given to you by your health care provider. Make sure you discuss any questions you have with your health care provider.  Low sodium diet. Activity as tolerated. Smoking cessation. Drink plenty of fluids.  Take all medications as prescribed.  Notify your doctor with all questions or concerns.

## 2014-11-08 NOTE — Discharge Summary (Signed)
Christopher Mason at [place] NAME: Christopher Mason    MR#:  [id number]  DATE OF BIRTH:  1960-06-06  DATE OF ADMISSION:  11/06/2014 ADMITTING PHYSICIAN: Demetrios Loll, MD  DATE OF DISCHARGE: 11/08/2014 PRIMARY CARE PHYSICIAN: No PCP Per Patient    ADMISSION DIAGNOSIS:  Dehydration [E86.0] Acute renal failure, unspecified acute renal failure type [N17.9]   DISCHARGE DIAGNOSIS:  Acute renal failure Hypotension Hypokalemia  SECONDARY DIAGNOSIS:   Past Medical History  Diagnosis Date  . Depression   . Hypertension   . Hypothyroid   . COPD (chronic obstructive pulmonary disease)   . Irritable bowel syndrome (IBS)     HOSPITAL COURSE:   Acute renal failure. possible due to ATN secondary to hypotension, which is improved after treatment of IV fluid support. Renal ultrasound is normal. Lisinopril and Pravachol were on hold due to renal failure, which will be resumed after discharge.  Hypotension. Improved. Patient's home hypertension medications will be resumed. Hypokalemia. Improved after treatment with a potassium supplement. Tobacco abuse. He was counseled for smoking cessation and given nicotine patch. Diarrhea. The patient has 3 episodes of diarrhea yesterday, but stool C diff. Test is negative. Diarrhea resolved.  DISCHARGE CONDITIONS:   Stable  CONSULTS OBTAINED:     DRUG ALLERGIES:   Allergies  Allergen Reactions  . Aspirin Nausea And Vomiting and Swelling    DISCHARGE MEDICATIONS:   Current Discharge Medication List    START taking these medications   Details  nicotine (NICODERM CQ - DOSED IN MG/24 HOURS) 14 mg/24hr patch Place 1 patch (14 mg total) onto the skin daily. Qty: 28 patch, Refills: 0      CONTINUE these medications which have NOT CHANGED   Details  albuterol (PROVENTIL HFA;VENTOLIN HFA) 108 (90 BASE) MCG/ACT inhaler Inhale 2 puffs into the lungs every 4 (four) hours as needed for wheezing or shortness of  breath. Qty: 1 each, Refills: 0    amitriptyline (ELAVIL) 50 MG tablet Take 1 tablet (50 mg total) by mouth at bedtime. Qty: 30 tablet, Refills: 0    docusate sodium (COLACE) 100 MG capsule Take 1 capsule (100 mg total) by mouth daily. Qty: 30 capsule, Refills: 0    levothyroxine (SYNTHROID, LEVOTHROID) 75 MCG tablet Take 1 tablet (75 mcg total) by mouth daily. Qty: 30 tablet, Refills: 0    lisinopril (PRINIVIL,ZESTRIL) 40 MG tablet Take 1 tablet (40 mg total) by mouth daily. Qty: 30 tablet, Refills: 0    omeprazole (PRILOSEC) 20 MG capsule Take 1 capsule (20 mg total) by mouth daily. Qty: 30 capsule, Refills: 0    pravastatin (PRAVACHOL) 10 MG tablet Take 1 tablet (10 mg total) by mouth at bedtime. Qty: 30 tablet, Refills: 0    !! QUEtiapine (SEROQUEL) 25 MG tablet Take 1 tablet (25 mg total) by mouth 3 (three) times daily before meals. Qty: 90 tablet, Refills: 0    !! QUEtiapine (SEROQUEL) 300 MG tablet Take 1 tablet (300 mg total) by mouth at bedtime. Qty: 30 tablet, Refills: 0    venlafaxine (EFFEXOR) 50 MG tablet Take 1 tablet (50 mg total) by mouth 2 (two) times daily with a meal. With breakfats and lunch. Qty: 60 tablet, Refills: 0     !! - Potential duplicate medications found. Please discuss with provider.    STOP taking these medications     hyoscyamine (LEVSIN, ANASPAZ) 0.125 MG tablet          DISCHARGE INSTRUCTIONS:  If you experience worsening of your admission symptoms, develop shortness of breath, life threatening emergency, suicidal or homicidal thoughts you must seek medical attention immediately by calling 911 or calling your MD immediately  if symptoms less severe.  You Must read complete instructions/literature along with all the possible adverse reactions/side effects for all the Medicines you take and that have been prescribed to you. Take any new Medicines after you have completely understood and accept all the possible adverse reactions/side  effects.   Please note  You were cared for by a hospitalist during your hospital stay. If you have any questions about your discharge medications or the care you received while you were in the hospital after you are discharged, you can call the unit and asked to speak with the hospitalist on call if the hospitalist that took care of you is not available. Once you are discharged, your primary care physician will handle any further medical issues. Please note that NO REFILLS for any discharge medications will be authorized once you are discharged, as it is imperative that you return to your primary care physician (or establish a relationship with a primary care physician if you do not have one) for your aftercare needs so that they can reassess your need for medications and monitor your lab values.    Today   SUBJECTIVE   No complaint   VITAL SIGNS:  Blood pressure 165/88, pulse 60, temperature 97.9 F (36.6 C), temperature source Oral, resp. rate 16, height 5\' 7"  (1.702 m), weight 81.647 kg (180 lb), SpO2 96 %.  I/O:   Intake/Output Summary (Last 24 hours) at 11/08/14 1145 Last data filed at 11/08/14 1134  Gross per 24 hour  Intake 3528.57 ml  Output   1100 ml  Net 2428.57 ml    PHYSICAL EXAMINATION:  GENERAL:  54 y.o.-year-old patient lying in the bed with no acute distress.  EYES: Pupils equal, round, reactive to light and accommodation. No scleral icterus. Extraocular muscles intact.  HEENT: Head atraumatic, normocephalic. Oropharynx and nasopharynx clear.  NECK:  Supple, no jugular venous distention. No thyroid enlargement, no tenderness.  LUNGS: Normal breath sounds bilaterally, no wheezing, rales,rhonchi or crepitation. No use of accessory muscles of respiration.  CARDIOVASCULAR: S1, S2 normal. No murmurs, rubs, or gallops.  ABDOMEN: Soft, non-tender, non-distended. Bowel sounds present. No organomegaly or mass.  EXTREMITIES: No pedal edema, cyanosis, or clubbing.   NEUROLOGIC: Cranial nerves II through XII are intact. Muscle strength 5/5 in all extremities. Sensation intact. Gait not checked.  PSYCHIATRIC: The patient is alert and oriented x 3.  SKIN: No obvious rash, lesion, or ulcer.   DATA REVIEW:   CBC  Recent Labs Lab 11/06/14 1243  WBC 11.6*  HGB 14.5  HCT 44.0  PLT 181    Chemistries   Recent Labs Lab 11/08/14 0441  NA 140  K 3.8  CL 111  CO2 24  GLUCOSE 101*  BUN 14  CREATININE 0.79  CALCIUM 8.7*  MG 1.5*    Cardiac Enzymes No results for input(s): TROPONINI in the last 168 hours.  Microbiology Results  Results for orders placed or performed during the hospital encounter of 11/06/14  C difficile quick scan w PCR reflex ([place] only)     Status: None   Collection Time: 11/07/14  2:35 PM  Result Value Ref Range Status   C Diff antigen NEGATIVE  Final   C Diff toxin NEGATIVE  Final   C Diff interpretation Negative for C. difficile  Final  RADIOLOGY:  US Renal  11/07/2014   CLINICAL DATA:  Acute renal failure.  EXAM: RENAL / URINARY TRACT ULTRASOUND COMPLETE  COMPARISON:  None.  FINDINGS: Right Kidney:  Length: 11.3 cm. Echogenicity within normal limits. No mass or hydronephrosis visualized.  Left Kidney:  Length: 11.7 cm. Echogenicity within normal limits. No mass or hydronephrosis visualized.  Bladder:  Appears normal for degree of bladder distention.  IMPRESSION: Normal renal ultrasound   Electronically Signed   By: Lajean Manes M.D.   On: 11/07/2014 10:23        Management plans discussed with the patient, family and they are in agreement.  CODE STATUS:     Code Status Orders        Start     Ordered   11/06/14 1742  Full code   Continuous     11/06/14 1741      TOTAL TIME TAKING CARE OF THIS PATIENT: 35 minutes.    Demetrios Loll M.D on 11/08/2014 at 11:45 AM  Between 7am to 6pm - Pager - [phone]  After 6pm go to [email or web address] - password [email or web address] [facility]  [place] [facility]  Office   [phone]  CC: Primary care physician; No PCP Per Patient

## 2014-11-20 ENCOUNTER — Emergency Department: Payer: Medicaid Other

## 2014-11-20 ENCOUNTER — Encounter: Payer: Self-pay | Admitting: Emergency Medicine

## 2014-11-20 ENCOUNTER — Inpatient Hospital Stay
Admission: EM | Admit: 2014-11-20 | Discharge: 2014-11-21 | DRG: 684 | Disposition: A | Discharge status: Home / Self Care | Payer: Medicaid Other | Attending: Internal Medicine | Admitting: Internal Medicine

## 2014-11-20 DIAGNOSIS — K589 Irritable bowel syndrome without diarrhea: Secondary | ICD-10-CM | POA: Diagnosis present

## 2014-11-20 DIAGNOSIS — Z820 Family history of epilepsy and other diseases of the nervous system: Secondary | ICD-10-CM

## 2014-11-20 DIAGNOSIS — I959 Hypotension, unspecified: Secondary | ICD-10-CM | POA: Diagnosis present

## 2014-11-20 DIAGNOSIS — Z8249 Family history of ischemic heart disease and other diseases of the circulatory system: Secondary | ICD-10-CM

## 2014-11-20 DIAGNOSIS — S0101XA Laceration without foreign body of scalp, initial encounter: Secondary | ICD-10-CM | POA: Diagnosis present

## 2014-11-20 DIAGNOSIS — W19XXXA Unspecified fall, initial encounter: Secondary | ICD-10-CM | POA: Diagnosis present

## 2014-11-20 DIAGNOSIS — N179 Acute kidney failure, unspecified: Principal | ICD-10-CM | POA: Diagnosis present

## 2014-11-20 DIAGNOSIS — Z79899 Other long term (current) drug therapy: Secondary | ICD-10-CM

## 2014-11-20 DIAGNOSIS — Z833 Family history of diabetes mellitus: Secondary | ICD-10-CM | POA: Diagnosis not present

## 2014-11-20 DIAGNOSIS — E039 Hypothyroidism, unspecified: Secondary | ICD-10-CM | POA: Diagnosis present

## 2014-11-20 DIAGNOSIS — E860 Dehydration: Secondary | ICD-10-CM | POA: Diagnosis present

## 2014-11-20 DIAGNOSIS — Z9889 Other specified postprocedural states: Secondary | ICD-10-CM

## 2014-11-20 DIAGNOSIS — F17210 Nicotine dependence, cigarettes, uncomplicated: Secondary | ICD-10-CM | POA: Diagnosis present

## 2014-11-20 DIAGNOSIS — J449 Chronic obstructive pulmonary disease, unspecified: Secondary | ICD-10-CM | POA: Diagnosis present

## 2014-11-20 DIAGNOSIS — Z7951 Long term (current) use of inhaled steroids: Secondary | ICD-10-CM | POA: Diagnosis not present

## 2014-11-20 DIAGNOSIS — Z888 Allergy status to other drugs, medicaments and biological substances status: Secondary | ICD-10-CM | POA: Diagnosis not present

## 2014-11-20 DIAGNOSIS — F1210 Cannabis abuse, uncomplicated: Secondary | ICD-10-CM | POA: Diagnosis present

## 2014-11-20 DIAGNOSIS — R319 Hematuria, unspecified: Secondary | ICD-10-CM | POA: Diagnosis present

## 2014-11-20 DIAGNOSIS — E785 Hyperlipidemia, unspecified: Secondary | ICD-10-CM | POA: Diagnosis present

## 2014-11-20 DIAGNOSIS — I10 Essential (primary) hypertension: Secondary | ICD-10-CM | POA: Diagnosis present

## 2014-11-20 DIAGNOSIS — R809 Proteinuria, unspecified: Secondary | ICD-10-CM | POA: Diagnosis present

## 2014-11-20 DIAGNOSIS — F329 Major depressive disorder, single episode, unspecified: Secondary | ICD-10-CM | POA: Diagnosis present

## 2014-11-20 HISTORY — DX: Acute kidney failure, unspecified: N17.9

## 2014-11-20 LAB — COMPREHENSIVE METABOLIC PANEL
ALK PHOS: 74 U/L (ref 38–126)
ALT: 17 U/L (ref 17–63)
AST: 20 U/L (ref 15–41)
Albumin: 3.8 g/dL (ref 3.5–5.0)
Anion gap: 11 (ref 5–15)
BUN: 62 mg/dL — AB (ref 6–20)
CHLORIDE: 106 mmol/L (ref 101–111)
CO2: 20 mmol/L — AB (ref 22–32)
CREATININE: 5.09 mg/dL — AB (ref 0.61–1.24)
Calcium: 9.2 mg/dL (ref 8.9–10.3)
GFR calc non Af Amer: 12 mL/min — ABNORMAL LOW (ref >60)
GFR, EST AFRICAN AMERICAN: 14 mL/min — AB (ref >60)
GLUCOSE: 153 mg/dL — AB (ref 65–99)
Potassium: 4.2 mmol/L (ref 3.5–5.1)
Sodium: 137 mmol/L (ref 135–145)
Total Bilirubin: 0.3 mg/dL (ref 0.3–1.2)
Total Protein: 7 g/dL (ref 6.5–8.1)

## 2014-11-20 LAB — CBC
HCT: 37.5 % — ABNORMAL LOW (ref 40.0–52.0)
Hemoglobin: 12.8 g/dL — ABNORMAL LOW (ref 13.0–18.0)
MCH: 30.9 pg (ref 26.0–34.0)
MCHC: 34.1 g/dL (ref 32.0–36.0)
MCV: 90.7 fL (ref 80.0–100.0)
Platelets: 199 10*3/uL (ref 150–440)
RBC: 4.13 MIL/uL — AB (ref 4.40–5.90)
RDW: 14.7 % — ABNORMAL HIGH (ref 11.5–14.5)
WBC: 8.4 10*3/uL (ref 3.8–10.6)

## 2014-11-20 LAB — GLUCOSE, CAPILLARY: GLUCOSE-CAPILLARY: 160 mg/dL — AB (ref 65–99)

## 2014-11-20 LAB — URINALYSIS COMPLETE WITH MICROSCOPIC (ARMC ONLY)
BILIRUBIN URINE: NEGATIVE
GLUCOSE, UA: NEGATIVE mg/dL
Ketones, ur: NEGATIVE mg/dL
LEUKOCYTES UA: NEGATIVE
Nitrite: NEGATIVE
PH: 5 (ref 5.0–8.0)
Protein, ur: NEGATIVE mg/dL
SPECIFIC GRAVITY, URINE: 1.015 (ref 1.005–1.030)

## 2014-11-20 LAB — URINE DRUG SCREEN, QUALITATIVE (ARMC ONLY)
Amphetamines, Ur Screen: NOT DETECTED
BARBITURATES, UR SCREEN: NOT DETECTED
Benzodiazepine, Ur Scrn: NOT DETECTED
CANNABINOID 50 NG, UR ~~LOC~~: POSITIVE — AB
Cocaine Metabolite,Ur ~~LOC~~: NOT DETECTED
MDMA (Ecstasy)Ur Screen: NOT DETECTED
Methadone Scn, Ur: NOT DETECTED
Opiate, Ur Screen: NOT DETECTED
Phencyclidine (PCP) Ur S: NOT DETECTED
Tricyclic, Ur Screen: POSITIVE — AB

## 2014-11-20 LAB — TSH: TSH: 19.701 u[IU]/mL — ABNORMAL HIGH (ref 0.350–4.500)

## 2014-11-20 LAB — TROPONIN I

## 2014-11-20 LAB — CREATININE, URINE, RANDOM: Creatinine, Urine: 263 mg/dL

## 2014-11-20 LAB — ETHANOL: Alcohol, Ethyl (B): <5 mg/dL (ref <5)

## 2014-11-20 LAB — HEMOGLOBIN A1C: Hgb A1c MFr Bld: 5.3 % (ref 4.0–6.0)

## 2014-11-20 LAB — SODIUM, URINE, RANDOM: Sodium, Ur: 51 mmol/L

## 2014-11-20 MED ORDER — LEVOTHYROXINE SODIUM 100 MCG PO TABS
100.0000 ug | ORAL_TABLET | Freq: Every day | ORAL | Status: DC
  Administered 2014-11-21: 100 ug via ORAL
  Filled 2014-11-20 (×2): qty 1

## 2014-11-20 MED ORDER — DOCUSATE SODIUM 100 MG PO CAPS
100.0000 mg | ORAL_CAPSULE | Freq: Every day | ORAL | Status: DC
  Administered 2014-11-20 – 2014-11-21 (×2): 100 mg via ORAL
  Filled 2014-11-20 (×2): qty 1

## 2014-11-20 MED ORDER — HEPARIN SODIUM (PORCINE) 5000 UNIT/ML IJ SOLN
5000.0000 [IU] | Freq: Three times a day (TID) | INTRAMUSCULAR | Status: DC
  Administered 2014-11-20 – 2014-11-21 (×4): 5000 [IU] via SUBCUTANEOUS
  Filled 2014-11-20 (×4): qty 1

## 2014-11-20 MED ORDER — AMITRIPTYLINE HCL 25 MG PO TABS
50.0000 mg | ORAL_TABLET | Freq: Every day | ORAL | Status: DC
  Administered 2014-11-20: 50 mg via ORAL
  Filled 2014-11-20 (×2): qty 2

## 2014-11-20 MED ORDER — PRAVASTATIN SODIUM 10 MG PO TABS
10.0000 mg | ORAL_TABLET | Freq: Every day | ORAL | Status: DC
  Administered 2014-11-20: 10 mg via ORAL
  Filled 2014-11-20: qty 1

## 2014-11-20 MED ORDER — QUETIAPINE FUMARATE 25 MG PO TABS
25.0000 mg | ORAL_TABLET | Freq: Three times a day (TID) | ORAL | Status: DC
  Administered 2014-11-20 – 2014-11-21 (×4): 25 mg via ORAL
  Filled 2014-11-20 (×4): qty 1

## 2014-11-20 MED ORDER — SODIUM CHLORIDE 0.9 % IJ SOLN
3.0000 mL | Freq: Two times a day (BID) | INTRAMUSCULAR | Status: DC
  Administered 2014-11-20 (×2): 3 mL via INTRAVENOUS

## 2014-11-20 MED ORDER — LEVOTHYROXINE SODIUM 75 MCG PO TABS
75.0000 ug | ORAL_TABLET | Freq: Every day | ORAL | Status: DC
  Administered 2014-11-20: 75 ug via ORAL
  Filled 2014-11-20 (×2): qty 1

## 2014-11-20 MED ORDER — SODIUM CHLORIDE 0.9 % IV BOLUS (SEPSIS)
1000.0000 mL | Freq: Once | INTRAVENOUS | Status: AC
  Administered 2014-11-20: 1000 mL via INTRAVENOUS

## 2014-11-20 MED ORDER — SODIUM CHLORIDE 0.9 % IV SOLN
INTRAVENOUS | Status: DC
  Administered 2014-11-20: 1000 mL via INTRAVENOUS
  Administered 2014-11-20 – 2014-11-21 (×2): via INTRAVENOUS

## 2014-11-20 MED ORDER — ONDANSETRON HCL 4 MG PO TABS: 4.0000 mg | ORAL_TABLET | Freq: Four times a day (QID) | ORAL | Status: DC | PRN

## 2014-11-20 MED ORDER — ALBUTEROL SULFATE (2.5 MG/3ML) 0.083% IN NEBU: 3.0000 mL | INHALATION_SOLUTION | RESPIRATORY_TRACT | Status: DC | PRN

## 2014-11-20 MED ORDER — ACETAMINOPHEN 650 MG RE SUPP: 650.0000 mg | Freq: Four times a day (QID) | RECTAL | Status: DC | PRN

## 2014-11-20 MED ORDER — PANTOPRAZOLE SODIUM 40 MG PO TBEC
40.0000 mg | DELAYED_RELEASE_TABLET | Freq: Every day | ORAL | Status: DC
  Administered 2014-11-20 – 2014-11-21 (×2): 40 mg via ORAL
  Filled 2014-11-20 (×2): qty 1

## 2014-11-20 MED ORDER — ALUM & MAG HYDROXIDE-SIMETH 200-200-20 MG/5ML PO SUSP
30.0000 mL | Freq: Four times a day (QID) | ORAL | Status: DC | PRN
  Administered 2014-11-20: 30 mL via ORAL
  Filled 2014-11-20: qty 30

## 2014-11-20 MED ORDER — QUETIAPINE FUMARATE 100 MG PO TABS
300.0000 mg | ORAL_TABLET | Freq: Every day | ORAL | Status: DC
  Administered 2014-11-20: 300 mg via ORAL
  Filled 2014-11-20: qty 3

## 2014-11-20 MED ORDER — ACETAMINOPHEN 325 MG PO TABS
650.0000 mg | ORAL_TABLET | Freq: Four times a day (QID) | ORAL | Status: DC | PRN
  Administered 2014-11-20: 650 mg via ORAL
  Filled 2014-11-20: qty 2

## 2014-11-20 MED ORDER — NICOTINE 14 MG/24HR TD PT24
14.0000 mg | MEDICATED_PATCH | Freq: Every day | TRANSDERMAL | Status: DC
  Administered 2014-11-20: 14 mg via TRANSDERMAL
  Filled 2014-11-20: qty 1

## 2014-11-20 MED ORDER — VENLAFAXINE HCL 25 MG PO TABS
50.0000 mg | ORAL_TABLET | Freq: Two times a day (BID) | ORAL | Status: DC
  Administered 2014-11-20 – 2014-11-21 (×3): 50 mg via ORAL
  Filled 2014-11-20 (×3): qty 2

## 2014-11-20 MED ORDER — ONDANSETRON HCL 4 MG/2ML IJ SOLN: 4.0000 mg | Freq: Four times a day (QID) | INTRAMUSCULAR | Status: DC | PRN

## 2014-11-20 NOTE — Progress Notes (Signed)
[place] [facility] [place] NAME: Christopher Mason    MR#:  [id number]  DATE OF BIRTH:  1961/03/13  SUBJECTIVE:  CHIEF COMPLAINT:   Chief Complaint  Patient presents with  . Near Syncope  . Dizziness  sleepy. REVIEW OF SYSTEMS:  CONSTITUTIONAL: No fever, fatigue or weakness.  EYES: No blurred or double vision.  EARS, NOSE, AND THROAT: No tinnitus or ear pain.  RESPIRATORY: No cough, shortness of breath, wheezing or hemoptysis.  CARDIOVASCULAR: No chest pain, orthopnea, edema.  GASTROINTESTINAL: No nausea, vomiting, but has diarrhea, no abdominal pain.  GENITOURINARY: No dysuria, hematuria.  ENDOCRINE: No polyuria, nocturia,  HEMATOLOGY: No anemia, easy bruising or bleeding SKIN: No rash or lesion. MUSCULOSKELETAL: No joint pain or arthritis.   NEUROLOGIC: No tingling, numbness, weakness. Dizzy/presyncope PSYCHIATRY: No anxiety or depression. DRUG ALLERGIES:   Allergies  Allergen Reactions  . Aspirin Nausea And Vomiting and Swelling   VITALS:  Blood pressure 115/71, pulse 73, temperature 98.2 F (36.8 C), temperature source Oral, resp. rate 17, height 5\' 7"  (1.702 m), weight 73.71 kg (162 lb 8 oz), SpO2 94 %. PHYSICAL EXAMINATION:  GENERAL:  54 y.o.-year-old patient lying in the bed with no acute distress.  EYES: Pupils equal, round, reactive to light and accommodation. No scleral icterus. Extraocular muscles intact.  HEENT: Head atraumatic, normocephalic. Oropharynx and nasopharynx clear.  NECK:  Supple, no jugular venous distention. No thyroid enlargement, no tenderness.  LUNGS: Normal breath sounds bilaterally, no wheezing, rales,rhonchi or crepitation. No use of accessory muscles of respiration.  CARDIOVASCULAR: S1, S2 normal. No murmurs, rubs, or gallops.  ABDOMEN: Soft, nontender, nondistended. Bowel sounds present. No organomegaly or mass.  EXTREMITIES: No pedal edema, cyanosis, or clubbing.  NEUROLOGIC: Cranial nerves II  through XII are intact. Muscle strength 5/5 in all extremities. Sensation intact. Gait not checked.  PSYCHIATRIC: Sleepy but wakes up easily SKIN: No obvious rash, lesion, or ulcer.  LABORATORY PANEL:   CBC  Recent Labs Lab 11/20/14 0243  WBC 8.4  HGB 12.8*  HCT 37.5*  PLT 199   ------------------------------------------------------------------------------------------------------------------  Chemistries   Recent Labs Lab 11/20/14 0243  NA 137  K 4.2  CL 106  CO2 20*  GLUCOSE 153*  BUN 62*  CREATININE 5.09*  CALCIUM 9.2  AST 20  ALT 17  ALKPHOS 74  BILITOT 0.3   RADIOLOGY:  Ct Head Wo Contrast  11/20/2014   CLINICAL DATA:  Syncopal episode, dizziness after THC inhalation. Similar symptoms previously. Slurred speech.  EXAM: CT HEAD WITHOUT CONTRAST  TECHNIQUE: Contiguous axial images were obtained from the base of the skull through the vertex without intravenous contrast.  COMPARISON:  None.  FINDINGS: Mildly motion degraded examination.  The ventricles and sulci are upper limits of normal for age. No intraparenchymal hemorrhage, mass effect nor midline shift. No acute large vascular territory infarcts.  No abnormal extra-axial fluid collections. Basal cisterns are patent.  No skull fracture. Old mildly depressed LEFT nasal bone fracture. Small apparent LEFT parietal scalp hematoma. The included ocular globes and orbital contents are non-suspicious. Mild paranasal sinus mucosal thickening, scattered mucosal retention cyst. The mastoid air cells are well aerated.  IMPRESSION: No acute intracranial process on this mildly motion degraded examination.  Small apparent LEFT parietal scalp hematoma, recommend correlation with point tenderness. No skull fracture.  Borderline parenchymal brain volume loss for age.   Electronically Signed   By: Elon Alas M.D.   On: 11/20/2014 04:47   ASSESSMENT AND  PLAN:  This is a 54 year old Caucasian male admitted for acute renal  failure.  1. Acute renal failure: We will aggressively hydrate patient and avoid nephrotoxic drugs. possible due to ATN secondary to hypotension/ prerenal. hold lisinopril. appreciate nephrology input 2. Marijuana abuse: Arrange for outpatient treatment or support group 3. Hypothyroidism: Continue Synthroid 4. Tobacco abuse: NicoDerm patch, counseled for 3 mins 5.  Presyncope/dizziness: likely due to dehydration, renal failure  DVT & GI prophylaxis: heparin SQ   All the records are reviewed and case discussed with Care Management/Social Worker. Management plans discussed with the patient, family and they are in agreement.  CODE STATUS: full code.  TOTAL TIME TAKING CARE OF THIS PATIENT: 35 minutes.   POSSIBLE D/C IN 1-2 DAYS, DEPENDING ON CLINICAL CONDITION.   [facility], Christopher Mason M.D on 11/20/2014 at 10:49 AM  Between 7am to 6pm - Pager - [phone]  After 6pm go to [email or web address] - password [email or web address] [facility]  [place] [facility]  Office  [phone]  CC: Primary care physician; No PCP Per Patient

## 2014-11-20 NOTE — ED Notes (Signed)
Pt presents to ED via EMS with c/o of syncope/dizziness with THC substance inhalation. EMS states pt has been experiencing these presenting sx since first inhalation approximately 3-4 hours prior to EMS arrival. EMS states pt has been seen in the ER in the past for the same presenting sx. Pt presents to ED treatment room with slurred speech and able to follow simple commands. MD at bedside, evaluating pt health status.

## 2014-11-20 NOTE — Progress Notes (Signed)
Initial Nutrition Assessment  INTERVENTION:   Meals and Snacks: Cater to patient preferences Medical Food Supplement Therapy: will recommend on follow if intake poor   NUTRITION DIAGNOSIS:   No nutrition diagnosis at this time.  GOAL:   Patient will meet greater than or equal to 90% of their needs  MONITOR:    (Energy Intake, Digestive System, Electrolyte and renal Profile)  REASON FOR ASSESSMENT:   Diagnosis   ASSESSMENT:   Pt admitted with syncope and laceration to scalp. Pt also noted to be with acute renal failure. Pt asleep on visit.  Past Medical History  Diagnosis Date  . Depression   . Hypertension   . Hypothyroid   . COPD (chronic obstructive pulmonary disease)   . Irritable bowel syndrome (IBS)     Diet Order:  Diet Heart Room service appropriate?: Yes; Fluid consistency:: Thin    Current Nutrition: Pt ate 100% of breakfast this am including eggs, pancakes, home fries and coffee. Pt boxed meal wrapper also on tray, had eaten 100% of [place] sandwich.  Food/Nutrition-Related History: Unable to assess; per MST no decrease in appetite PTA. RD notes in MD note, pt had reported good appetite.   Medications: NS at 143mL/hr, Colace  Electrolyte/Renal Profile and Glucose Profile:   Recent Labs Lab 11/20/14 0243  NA 137  K 4.2  CL 106  CO2 20*  BUN 62*  CREATININE 5.09*  CALCIUM 9.2  GLUCOSE 153*   Protein Profile:  Recent Labs Lab 11/20/14 0243  ALBUMIN 3.8    Gastrointestinal Profile: WDL per Nsg Last BM: unknown  Skin:  Reviewed, no issues   Weight Change: Per [facility] pt with weight documented of 180lbs last admission 2 weeks ago which was a gain from 2 months ago.  Height:   Ht Readings from Last 1 Encounters:  11/20/14 5\' 7"  (1.702 m)    Weight:   Wt Readings from Last 1 Encounters:  11/20/14 162 lb 8 oz (73.71 kg)   Wt Readings from Last 10 Encounters:  11/20/14 162 lb 8 oz (73.71 kg)  11/06/14 180 lb (81.647 kg)  09/17/14  150 lb (68.04 kg)  09/16/14 150 lb (68.04 kg)    BMI:  Body mass index is 25.45 kg/(m^2).   EDUCATION NEEDS:   No education needs identified at this time   [place], [place], LDN Pager [phone]

## 2014-11-20 NOTE — Progress Notes (Signed)
Pharmacy Note - Levothyroxine  Spoke to patient in regards to his home medication, levothyroxine. Patient reports that he is compliant with medication, takes daily in the morning before breakfast. Says that he has been on current regimen of 79mcg daily for "years."  TSH elevated, will increase to levothyroxine 125mcg daily per discussion with Dr.Shah. Recommend rechecking TSH in 6 weeks to assess response.  Rexene Edison, PharmD Clinical Pharmacist 11/20/2014 1:36 PM

## 2014-11-20 NOTE — H&P (Signed)
Christopher Mason is an 54 y.o. male.   Chief Complaint: Fall HPI: The patient presents emergency department after suffering a fall and laceration to his scalp. In the emergency department the patient is unable to give history aside from marijuana usage. The patient denies chest pain. Laboratory evaluation revealed acute kidney failure which prompted the emergency department to call for admission.  Past Medical History  Diagnosis Date  . Depression   . Hypertension   . Hypothyroid   . COPD (chronic obstructive pulmonary disease)   . Irritable bowel syndrome (IBS)     Past Surgical History  Procedure Laterality Date  . Back surgery    . Rotator cuff repair      2007  . Carpal tunnel release Bilateral     Family History  Problem Relation Age of Onset  . Dementia Mother   . Hypertension Father   . Cataracts Father   . Diabetes Sister   . Diabetes Brother    Social History:  reports that he has been smoking Cigarettes.  He has been smoking about 1.00 pack per day. He has never used smokeless tobacco. He reports that he uses illicit drugs (Marijuana) about once per week. He reports that he does not drink alcohol.  Allergies:  Allergies  Allergen Reactions  . Aspirin Nausea And Vomiting and Swelling    Medications Prior to Admission  Medication Sig Dispense Refill  . albuterol (PROVENTIL HFA;VENTOLIN HFA) 108 (90 BASE) MCG/ACT inhaler Inhale 2 puffs into the lungs every 4 (four) hours as needed for wheezing or shortness of breath. 1 each 0  . amitriptyline (ELAVIL) 50 MG tablet Take 1 tablet (50 mg total) by mouth at bedtime. 30 tablet 0  . docusate sodium (COLACE) 100 MG capsule Take 1 capsule (100 mg total) by mouth daily. 30 capsule 0  . levothyroxine (SYNTHROID, LEVOTHROID) 75 MCG tablet Take 1 tablet (75 mcg total) by mouth daily. 30 tablet 0  . lisinopril (PRINIVIL,ZESTRIL) 40 MG tablet Take 1 tablet (40 mg total) by mouth daily. 30 tablet 0  . nicotine (NICODERM CQ - DOSED IN  MG/24 HOURS) 14 mg/24hr patch Place 1 patch (14 mg total) onto the skin daily. 28 patch 0  . omeprazole (PRILOSEC) 20 MG capsule Take 1 capsule (20 mg total) by mouth daily. 30 capsule 0  . pravastatin (PRAVACHOL) 10 MG tablet Take 1 tablet (10 mg total) by mouth at bedtime. 30 tablet 0  . QUEtiapine (SEROQUEL) 25 MG tablet Take 1 tablet (25 mg total) by mouth 3 (three) times daily before meals. 90 tablet 0  . QUEtiapine (SEROQUEL) 300 MG tablet Take 1 tablet (300 mg total) by mouth at bedtime. 30 tablet 0  . venlafaxine (EFFEXOR) 50 MG tablet Take 1 tablet (50 mg total) by mouth 2 (two) times daily with a meal. With breakfats and lunch. (Patient taking differently: Take 50 mg by mouth 2 (two) times daily with a meal. ) 60 tablet 0    Results for orders placed or performed during the hospital encounter of 11/20/14 (from the past 48 hour(s))  CBC     Status: Abnormal   Collection Time: 11/20/14  2:43 AM  Result Value Ref Range   WBC 8.4 3.8 - 10.6 K/uL   RBC 4.13 (L) 4.40 - 5.90 MIL/uL   Hemoglobin 12.8 (L) 13.0 - 18.0 g/dL   HCT 37.5 (L) 40.0 - 52.0 %   MCV 90.7 80.0 - 100.0 fL   MCH 30.9 26.0 - 34.0 pg  MCHC 34.1 32.0 - 36.0 g/dL   RDW 14.7 (H) 11.5 - 14.5 %   Platelets 199 150 - 440 K/uL  Ethanol (ETOH)     Status: None   Collection Time: 11/20/14  2:43 AM  Result Value Ref Range   Alcohol, Ethyl (B) <5 <5 mg/dL    Comment:        LOWEST DETECTABLE LIMIT FOR SERUM ALCOHOL IS 5 mg/dL FOR MEDICAL PURPOSES ONLY   Comprehensive metabolic panel     Status: Abnormal   Collection Time: 11/20/14  2:43 AM  Result Value Ref Range   Sodium 137 135 - 145 mmol/L   Potassium 4.2 3.5 - 5.1 mmol/L   Chloride 106 101 - 111 mmol/L   CO2 20 (L) 22 - 32 mmol/L   Glucose, Bld 153 (H) 65 - 99 mg/dL   BUN 62 (H) 6 - 20 mg/dL   Creatinine, Ser 5.09 (H) 0.61 - 1.24 mg/dL   Calcium 9.2 8.9 - 10.3 mg/dL   Total Protein 7.0 6.5 - 8.1 g/dL   Albumin 3.8 3.5 - 5.0 g/dL   AST 20 15 - 41 U/L   ALT 17  17 - 63 U/L   Alkaline Phosphatase 74 38 - 126 U/L   Total Bilirubin 0.3 0.3 - 1.2 mg/dL   GFR calc non Af Amer 12 (L) >60 mL/min   GFR calc Af Amer 14 (L) >60 mL/min    Comment: (NOTE) The eGFR has been calculated using the CKD EPI equation. This calculation has not been validated in all clinical situations. eGFR's persistently <60 mL/min signify possible Chronic Kidney Disease.    Anion gap 11 5 - 15  Troponin I     Status: None   Collection Time: 11/20/14  2:43 AM  Result Value Ref Range   Troponin I <0.03 <0.031 ng/mL    Comment:        NO INDICATION OF MYOCARDIAL INJURY.   Glucose, capillary     Status: Abnormal   Collection Time: 11/20/14  2:56 AM  Result Value Ref Range   Glucose-Capillary 160 (H) 65 - 99 mg/dL   Comment 1 Notify RN   Urine Drug Screen, Qualitative ([facility] only)     Status: Abnormal   Collection Time: 11/20/14  5:15 AM  Result Value Ref Range   Tricyclic, Ur Screen POSITIVE (A) NONE DETECTED   Amphetamines, Ur Screen NONE DETECTED NONE DETECTED   MDMA (Ecstasy)Ur Screen NONE DETECTED NONE DETECTED   Cocaine Metabolite,Ur [place] NONE DETECTED NONE DETECTED   Opiate, Ur Screen NONE DETECTED NONE DETECTED   Phencyclidine (PCP) Ur S NONE DETECTED NONE DETECTED   Cannabinoid 50 Ng, Ur [place] POSITIVE (A) NONE DETECTED   Barbiturates, Ur Screen NONE DETECTED NONE DETECTED   Benzodiazepine, Ur Scrn NONE DETECTED NONE DETECTED   Methadone Scn, Ur NONE DETECTED NONE DETECTED    Comment: (NOTE) 314  Tricyclics, urine               Cutoff 1000 ng/mL 200  Amphetamines, urine             Cutoff 1000 ng/mL 300  MDMA (Ecstasy), urine           Cutoff 500 ng/mL 400  Cocaine Metabolite, urine       Cutoff 300 ng/mL 500  Opiate, urine                   Cutoff 300 ng/mL 600  Phencyclidine (PCP), urine  Cutoff 25 ng/mL 700  Cannabinoid, urine              Cutoff 50 ng/mL 800  Barbiturates, urine             Cutoff 200 ng/mL 900  Benzodiazepine, urine           Cutoff 200  ng/mL 1000 Methadone, urine                Cutoff 300 ng/mL 1100 1200 The urine drug screen provides only a preliminary, unconfirmed 1300 analytical test result and should not be used for non-medical 1400 purposes. Clinical consideration and professional judgment should 1500 be applied to any positive drug screen result due to possible 1600 interfering substances. A more specific alternate chemical method 1700 must be used in order to obtain a confirmed analytical result.  1800 Gas chromato graphy / mass spectrometry (GC/MS) is the preferred 1900 confirmatory method.   Urinalysis complete, with microscopic ([facility] only)     Status: Abnormal   Collection Time: 11/20/14  5:15 AM  Result Value Ref Range   Color, Urine YELLOW (A) YELLOW   APPearance CLEAR (A) CLEAR   Glucose, UA NEGATIVE NEGATIVE mg/dL   Bilirubin Urine NEGATIVE NEGATIVE   Ketones, ur NEGATIVE NEGATIVE mg/dL   Specific Gravity, Urine 1.015 1.005 - 1.030   Hgb urine dipstick 1+ (A) NEGATIVE   pH 5.0 5.0 - 8.0   Protein, ur NEGATIVE NEGATIVE mg/dL   Nitrite NEGATIVE NEGATIVE   Leukocytes, UA NEGATIVE NEGATIVE   RBC / HPF 0-5 0 - 5 RBC/hpf   WBC, UA 0-5 0 - 5 WBC/hpf   Bacteria, UA RARE (A) NONE SEEN   Squamous Epithelial / LPF 0-5 (A) NONE SEEN   Mucous PRESENT    Hyaline Casts, UA PRESENT    Ct Head Wo Contrast  11/20/2014   CLINICAL DATA:  Syncopal episode, dizziness after THC inhalation. Similar symptoms previously. Slurred speech.  EXAM: CT HEAD WITHOUT CONTRAST  TECHNIQUE: Contiguous axial images were obtained from the base of the skull through the vertex without intravenous contrast.  COMPARISON:  None.  FINDINGS: Mildly motion degraded examination.  The ventricles and sulci are upper limits of normal for age. No intraparenchymal hemorrhage, mass effect nor midline shift. No acute large vascular territory infarcts.  No abnormal extra-axial fluid collections. Basal cisterns are patent.  No skull fracture. Old mildly  depressed LEFT nasal bone fracture. Small apparent LEFT parietal scalp hematoma. The included ocular globes and orbital contents are non-suspicious. Mild paranasal sinus mucosal thickening, scattered mucosal retention cyst. The mastoid air cells are well aerated.  IMPRESSION: No acute intracranial process on this mildly motion degraded examination.  Small apparent LEFT parietal scalp hematoma, recommend correlation with point tenderness. No skull fracture.  Borderline parenchymal brain volume loss for age.   Electronically Signed   By: Elon Alas M.D.   On: 11/20/2014 04:47    Review of Systems  Unable to perform ROS: patient intoxicated    Blood pressure 104/60, pulse 72, temperature 98 F (36.7 C), temperature source Oral, resp. rate 16, height 5' 7"  (1.702 m), weight 73.71 kg (162 lb 8 oz), SpO2 97 %. Physical Exam  Nursing note and vitals reviewed. Constitutional: He appears well-developed and well-nourished. He appears lethargic. No distress.  HENT:  Head: Normocephalic.    Mouth/Throat: Oropharynx is clear and moist.  Eyes: Conjunctivae and EOM are normal. Pupils are equal, round, and reactive to light.  Neck: Normal range of motion.  Neck supple. No JVD present. No tracheal deviation present. No thyromegaly present.  Cardiovascular: Normal rate, regular rhythm and intact distal pulses.  Exam reveals no gallop and no friction rub.   No murmur heard. Respiratory: Effort normal and breath sounds normal. No respiratory distress.  GI: Soft. Bowel sounds are normal. He exhibits no distension.  Genitourinary:  Deferred  Musculoskeletal: Normal range of motion. He exhibits no edema.  Lymphadenopathy:    He has no cervical adenopathy.  Neurological: He appears lethargic. No cranial nerve deficit.  Cannot assess orientation (the patient clearly has some delirium as he continues to ask for his dog)  Skin: Skin is warm and dry. No rash noted. No erythema.  Psychiatric:  Difficult to  assess psychological as the patient is intoxicated and somnolent     Assessment/Plan This is a 54 year old Caucasian male admitted for acute renal failure. 1. Acute renal failure: We will aggressively hydrate patient and avoid nephrotoxic drugs. We will calculate fractional excretion of sodium on the patient and have consulted nephrology for further guidance. 2. Marijuana abuse: Arrange for outpatient treatment or support group 3. Hypothyroidism: Continue Synthroid 4. Tobacco abuse: NicoDerm patch 5. DVT prophylaxis heparin 6. GI prophylaxis: None The patient is a full code. Time spent on admission orders and patient care approximately 35 minutes   Harrie Foreman 11/20/2014, 8:13 AM

## 2014-11-20 NOTE — ED Provider Notes (Addendum)
[facility] Emergency Department Provider Note  ____________________________________________  Time seen:   I have reviewed the triage vital signs and the nursing notes.   HISTORY  Chief Complaint Near Syncope and Dizziness      HPI Christopher Mason is a 54 y.o. male presents with syncopal episodes status post smoking marijuana. Patient presents to the emergency department with slurred speech. Of note patient states that he fell and struck the top of his head on short if there was loss of consciousness.     Past Medical History  Diagnosis Date  . Depression   . Hypertension   . Hypothyroid   . COPD (chronic obstructive pulmonary disease)   . Irritable bowel syndrome (IBS)     Patient Active Problem List   Diagnosis Date Noted  . ARF (acute renal failure) 11/06/2014  . Hypokalemia 11/06/2014  . Hypotension 11/06/2014  . Cannabis use disorder, severe, dependence 09/24/2014  . Undifferentiated schizophrenia   . Hypothyroidism 09/17/2014  . GERD (gastroesophageal reflux disease) 09/17/2014  . Tobacco use disorder 09/17/2014  . Non compliance w medication regimen 09/16/2014  . Asthma 09/16/2014  . HTN (hypertension) 09/16/2014  . Tardive dyskinesia 09/16/2014    Past Surgical History  Procedure Laterality Date  . Back surgery    . Rotator cuff repair      2007  . Carpal tunnel release Bilateral     Current Outpatient Rx  Name  Route  Sig  Dispense  Refill  . albuterol (PROVENTIL HFA;VENTOLIN HFA) 108 (90 BASE) MCG/ACT inhaler   Inhalation   Inhale 2 puffs into the lungs every 4 (four) hours as needed for wheezing or shortness of breath.   1 each   0   . amitriptyline (ELAVIL) 50 MG tablet   Oral   Take 1 tablet (50 mg total) by mouth at bedtime.   30 tablet   0   . docusate sodium (COLACE) 100 MG capsule   Oral   Take 1 capsule (100 mg total) by mouth daily.   30 capsule   0   . levothyroxine (SYNTHROID, LEVOTHROID) 75 MCG  tablet   Oral   Take 1 tablet (75 mcg total) by mouth daily.   30 tablet   0   . lisinopril (PRINIVIL,ZESTRIL) 40 MG tablet   Oral   Take 1 tablet (40 mg total) by mouth daily.   30 tablet   0   . nicotine (NICODERM CQ - DOSED IN MG/24 HOURS) 14 mg/24hr patch   Transdermal   Place 1 patch (14 mg total) onto the skin daily.   28 patch   0   . omeprazole (PRILOSEC) 20 MG capsule   Oral   Take 1 capsule (20 mg total) by mouth daily.   30 capsule   0   . pravastatin (PRAVACHOL) 10 MG tablet   Oral   Take 1 tablet (10 mg total) by mouth at bedtime.   30 tablet   0   . QUEtiapine (SEROQUEL) 25 MG tablet   Oral   Take 1 tablet (25 mg total) by mouth 3 (three) times daily before meals.   90 tablet   0   . QUEtiapine (SEROQUEL) 300 MG tablet   Oral   Take 1 tablet (300 mg total) by mouth at bedtime.   30 tablet   0   . venlafaxine (EFFEXOR) 50 MG tablet   Oral   Take 1 tablet (50 mg total) by mouth 2 (two) times daily with  a meal. With breakfats and lunch. Patient taking differently: Take 50 mg by mouth 2 (two) times daily with a meal.    60 tablet   0     Allergies Aspirin  Family History  Problem Relation Age of Onset  . Dementia Mother   . Hypertension Father   . Cataracts Father   . Diabetes Sister   . Diabetes Brother     Social History History  Substance Use Topics  . Smoking status: Current Every Day Smoker -- 1.00 packs/day    Types: Cigarettes  . Smokeless tobacco: Never Used  . Alcohol Use: No    Review of Systems  Constitutional: Negative for fever. Eyes: Negative for visual changes. ENT: Negative for sore throat. Cardiovascular: Negative for chest pain. Respiratory: Negative for shortness of breath. Gastrointestinal: Negative for abdominal pain, vomiting and diarrhea. Genitourinary: Negative for dysuria. Musculoskeletal: Negative for back pain. Skin: Negative for rash. Neurological: Negative for headaches, focal weakness or  numbness. Positive for syncope   10-point ROS otherwise negative.  ____________________________________________   PHYSICAL EXAM:  VITAL SIGNS: ED Triage Vitals  Enc Vitals Group     BP 11/20/14 0240 70/52 mmHg     Pulse Rate 11/20/14 0240 80     Resp 11/20/14 0240 17     Temp 11/20/14 0240 97.8 F (36.6 C)     Temp Source 11/20/14 0240 Oral     SpO2 11/20/14 0240 94 %     Weight 11/20/14 0240 160 lb (72.576 kg)     Height 11/20/14 0240 5\' 7"  (1.702 m)     Head Cir --      Peak Flow --      Pain Score 11/20/14 0241 0     Pain Loc --      Pain Edu? --      Excl. in [place]? --      Constitutional: Alert and oriented. Well appearing and in no distress. Eyes: Conjunctivae are normal. PERRL. Normal extraocular movements. ENT   Head: Normocephalic and abrasion to caput    Nose: No congestion/rhinnorhea.   Mouth/Throat: Mucous membranes are moist.   Neck: No stridor. Cardiovascular: Normal rate, regular rhythm. Normal and symmetric distal pulses are present in all extremities. No murmurs, rubs, or gallops. Respiratory: Normal respiratory effort without tachypnea nor retractions. Breath sounds are clear and equal bilaterally. No wheezes/rales/rhonchi. Gastrointestinal: Soft and nontender. No distention. There is no CVA tenderness. Genitourinary: deferred Musculoskeletal: Nontender with normal range of motion in all extremities. No joint effusions.  No lower extremity tenderness nor edema. Neurologic:  Slurred speech. No gross focal neurologic deficits are appreciated. Speech is normal.  Skin:  Skin is warm, dry and intact. No rash noted. Abrasion noted to  scalp. Psychiatric: Mood and affect are normal. Slurred speech. Patient exhibits appropriate insight and judgment.  ____________________________________________    LABS (pertinent positives/negatives)  Labs Reviewed  CBC - Abnormal; Notable for the following:    RBC 4.13 (*)    Hemoglobin 12.8 (*)    HCT 37.5  (*)    RDW 14.7 (*)    All other components within normal limits  COMPREHENSIVE METABOLIC PANEL - Abnormal; Notable for the following:    CO2 20 (*)    Glucose, Bld 153 (*)    BUN 62 (*)    Creatinine, Ser 5.09 (*)    GFR calc non Af Amer 12 (*)    GFR calc Af Amer 14 (*)    All other components within normal  limits  GLUCOSE, CAPILLARY - Abnormal; Notable for the following:    Glucose-Capillary 160 (*)    All other components within normal limits  ETHANOL  TROPONIN I  URINE DRUG SCREEN, QUALITATIVE ([facility] ONLY)  URINALYSIS COMPLETEWITH MICROSCOPIC ([facility] ONLY)     ____________________________________________      ED ECG REPORT I, BROWN, [place] N, the attending physician, personally viewed and interpreted this ECG.   Date: 11/20/2014  EKG Time: 2:39AM  Rate: 88  Rhythm: Normal sinus rhythm  Axis: none  Intervals:normal  ST&T Change: none     RADIOLOGY IMPRESSION: No acute intracranial process on this mildly motion degraded examination.  Small apparent LEFT parietal scalp hematoma, recommend correlation with point tenderness. No skull fracture.  Borderline parenchymal brain volume loss for age.   Electronically Signed By: Elon Alas M.D. On: 11/20/2014 04:47  CRITICAL CARE: 60 MINUTES   INITIAL IMPRESSION / ASSESSMENT AND PLAN / ED COURSE  Pertinent labs & imaging results that were available during my care of the patient were reviewed by me and considered in my medical decision making (see chart for details).  Patient received 3 liters normal saline with no urine output. BP improved to 104/68. Patient's creatinine on 717 was normal today increased to 5.09. Dr. Marcille Blanco notified of need for admission  ____________________________________________   FINAL CLINICAL IMPRESSION(S) / ED DIAGNOSES  Final diagnoses:  Acute renal failure, unspecified acute renal failure type      Gregor Hams, MD 11/20/14 [place],  MD 11/20/14 [phone]
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
## 2014-11-20 NOTE — Progress Notes (Addendum)
Pt arrival to floor at 0608 , Pt very sleepy and has garbled speech but appropriate . Upon skin assessment  Noted Abrasion on top of head, laceration on R thumb, scratches on R shoulder and B/L shin areas. Pt oriented to room environment, but will need to be reinforced due to his sleepy disposition. Call light and phone in reach. Bed exit alarm placed. Report given to day shift RN.

## 2014-11-20 NOTE — Clinical Social Work Note (Signed)
Clinical Social Work Assessment  Patient Details  Name: Christopher Mason MRN: [id number] Date of Birth: 02-Mar-1961  Date of referral:  11/20/14               Reason for consult:  Other (Comment Required) (living in [address])                Permission sought to share information with:  Other (ACT team) Permission granted to share information::  Yes, Verbal Permission Granted  Name::        Agency::     Relationship::     Contact Information:     Housing/Transportation Living arrangements for the past 2 months:  Hotel/Motel Source of Information:  Patient, Outpatient Provider Patient Interpreter Needed:  None Criminal Activity/Legal Involvement Pertinent to Current Situation/Hospitalization:  No - Comment as needed Significant Relationships:  Mental Health Provider Lives with:  Self Do you feel safe going back to the place where you live?  Yes Need for family participation in patient care:  No (Coment)  Care giving concerns:  Lives alone   Social Worker assessment / plan:  CSW met with patient due to consult for patient living in a local motel. Patient informed CSW that he has lived in and out of [address] for about 3 months and that he has a [place] that lives with him. Patient states that he has bipolar and that he uses marijuana to alleviate his back pain from 8 back surgeries. Patient states that he has no intention of quitting using marijuana. He states that he has used marijuana since the age of 73 and he is now 16. Patient states that he follows up with the ACT team for his bipolar and that they visit him at the motel frequently. Patient states that he takes his medications for bipolar consistently.   When asked who might could check on the dog he stated that someone from the ACT might be able to. Patient states that he has been in and out of group homes mainly in [facility] and that he will never go into a group home/assisted living facility ever again. Patient stated he uses his  monthly disability check for his necessities. Patient states that he intends to discharge back to the [facility].   CSW was able to find out that the ACT team that follows patient is [facility] at [phone]. CSW contacted this agency and spoke with Janett Billow who stated that they have been following patient consistently and while he has been at the motel [address]. Janett Billow stated that a ptient is on the waiting list for low income housing. Janett Billow stated if patient discharges over the weekend to contact the crisis line number to notify of the discharge at [phone]. Janett Billow stated that they would have  Staff member that could go over and check on the patient's dog in the motel room.   Employment status:  Disabled (Comment on whether or not currently receiving Disability) Insurance information:  Medicaid In [place] PT Recommendations:  Not assessed at this time Information / Referral to community resources:     Patient/Family's Response to care:  Patient was appreciative of [place] visit and has no intention of changing his current living situation.  Patient/Family's Understanding of and Emotional Response to Diagnosis, Current Treatment, and Prognosis:  Patient was pleasant and cooperative.   Emotional Assessment Appearance:  Appears older than stated age Attitude/Demeanor/Rapport:   (pleasant ) Affect (typically observed):  Adaptable, Appropriate Orientation:  Oriented to Self, Oriented  to Place, Oriented to  Time, Oriented to Situation Alcohol / Substance use:  Other (marijuana) Psych involvement (Current and /or in the community):  Outpatient Provider  Discharge Needs  Concerns to be addressed:  Care Coordination Readmission within the last 30 days:  No Current discharge risk:  None, Psychiatric Illness Barriers to Discharge:  No Barriers Identified, Active Substance Use   Shela Leff, LCSW 11/20/2014, 3:04 PM

## 2014-11-21 LAB — BASIC METABOLIC PANEL
Anion gap: 4 — ABNORMAL LOW (ref 5–15)
BUN: 33 mg/dL — ABNORMAL HIGH (ref 6–20)
CALCIUM: 8.4 mg/dL — AB (ref 8.9–10.3)
CO2: 20 mmol/L — ABNORMAL LOW (ref 22–32)
Chloride: 114 mmol/L — ABNORMAL HIGH (ref 101–111)
Creatinine, Ser: 0.98 mg/dL (ref 0.61–1.24)
GFR calc Af Amer: >60 mL/min (ref >60)
GLUCOSE: 98 mg/dL (ref 65–99)
Potassium: 4.6 mmol/L (ref 3.5–5.1)
Sodium: 138 mmol/L (ref 135–145)

## 2014-11-21 MED ORDER — AMLODIPINE BESYLATE 10 MG PO TABS: 10.0000 mg | ORAL_TABLET | Freq: Every day | ORAL | Status: DC

## 2014-11-21 MED ORDER — AMLODIPINE BESYLATE 10 MG PO TABS
10.0000 mg | ORAL_TABLET | Freq: Every day | ORAL | Status: DC
  Administered 2014-11-21: 10 mg via ORAL
  Filled 2014-11-21: qty 1

## 2014-11-21 NOTE — Care Management Note (Signed)
Case Management Note  Patient Details  Name: Christopher Mason MRN: [id number] Date of Birth: 1961-03-08  Subjective/Objective:        Spoke with Christopher Hock RN at [place] Team ph: [phone] who follow Christopher Mason in the community and assist with support services.Christopher Mason will pick up Christopher Mason around 11am this morning to transport him back to his residence at Christopher Mason [address], and assist him with getting his prescriptions filled.             Action/Plan:   Expected Discharge Date:                  Expected Discharge Plan:     In-House Referral:     Discharge planning Services     Post Acute Care Choice:    Choice offered to:     DME Arranged:    DME Agency:     HH Arranged:    [place] Agency:     Status of Service:     Medicare Important Message Given:    Date Medicare IM Given:    Medicare IM give by:    Date Additional Medicare IM Given:    Additional Medicare Important Message give by:     If discussed at [place] of Stay Meetings, dates discussed:    Additional Comments:  [name] A, RN 11/21/2014, 10:05 AM

## 2014-11-21 NOTE — Discharge Summary (Signed)
[place] [facility] [place] NAME: Christopher Mason    MR#:  [id number]  DATE OF BIRTH:  November 23, 1960  DATE OF ADMISSION:  11/20/2014 ADMITTING PHYSICIAN: Harrie Foreman, MD  DATE OF DISCHARGE: 11/21/2014  PRIMARY CARE PHYSICIAN: No PCP Per Patient    ADMISSION DIAGNOSIS:  Acute renal failure, unspecified acute renal failure type [N17.9]   DISCHARGE DIAGNOSIS:  Acute renal failure,  SECONDARY DIAGNOSIS:   Past Medical History  Diagnosis Date  . Depression   . Hypertension   . Hypothyroid   . COPD (chronic obstructive pulmonary disease)   . Irritable bowel syndrome (IBS)     HOSPITAL COURSE:   Acute renal failure: possible due to ATN secondary to hypotension/ prerenal. The patient has been treated with the IV fluid support. lisinopril was discontinued. His creatinine decreased from 5.09 to 0.98 today. I discussed with Dr. Delton Prairie, nephrologist. He suggested no use of lisinopril but adding Norvasc for hypertension. The patient may be discharged home today.   2. Marijuana abuse: Arrange for outpatient treatment or support group 3. Hypothyroidism: Continue Synthroid 4. Tobacco abuse: NicoDerm patch, counseled for 3 mins 5. Presyncope/dizziness: likely due to dehydration, renal failure. Improved.  DISCHARGE CONDITIONS:   Stable. Discharge home today.  CONSULTS OBTAINED:  Treatment Team:  Lavonia Dana, MD  DRUG ALLERGIES:   Allergies  Allergen Reactions  . Aspirin Nausea And Vomiting and Swelling    DISCHARGE MEDICATIONS:   Current Discharge Medication List    START taking these medications   Details  amLODipine (NORVASC) 10 MG tablet Take 1 tablet (10 mg total) by mouth daily. Qty: 30 tablet, Refills: 0      CONTINUE these medications which have NOT CHANGED   Details  !! QUEtiapine (SEROQUEL) 25 MG tablet Take 1 tablet (25 mg total) by mouth 3 (three) times daily before meals. Qty: 90 tablet, Refills: 0    !!  QUEtiapine (SEROQUEL) 300 MG tablet Take 1 tablet (300 mg total) by mouth at bedtime. Qty: 30 tablet, Refills: 0    albuterol (PROVENTIL HFA;VENTOLIN HFA) 108 (90 BASE) MCG/ACT inhaler Inhale 2 puffs into the lungs every 4 (four) hours as needed for wheezing or shortness of breath. Qty: 1 each, Refills: 0    amitriptyline (ELAVIL) 50 MG tablet Take 1 tablet (50 mg total) by mouth at bedtime. Qty: 30 tablet, Refills: 0    docusate sodium (COLACE) 100 MG capsule Take 1 capsule (100 mg total) by mouth daily. Qty: 30 capsule, Refills: 0    levothyroxine (SYNTHROID, LEVOTHROID) 75 MCG tablet Take 1 tablet (75 mcg total) by mouth daily. Qty: 30 tablet, Refills: 0    nicotine (NICODERM CQ - DOSED IN MG/24 HOURS) 14 mg/24hr patch Place 1 patch (14 mg total) onto the skin daily. Qty: 28 patch, Refills: 0    omeprazole (PRILOSEC) 20 MG capsule Take 1 capsule (20 mg total) by mouth daily. Qty: 30 capsule, Refills: 0    pravastatin (PRAVACHOL) 10 MG tablet Take 1 tablet (10 mg total) by mouth at bedtime. Qty: 30 tablet, Refills: 0    venlafaxine (EFFEXOR) 50 MG tablet Take 1 tablet (50 mg total) by mouth 2 (two) times daily with a meal. With breakfats and lunch. Qty: 60 tablet, Refills: 0     !! - Potential duplicate medications found. Please discuss with provider.    STOP taking these medications     lisinopril (PRINIVIL,ZESTRIL) 40 MG tablet  DISCHARGE INSTRUCTIONS:    If you experience worsening of your admission symptoms, develop shortness of breath, life threatening emergency, suicidal or homicidal thoughts you must seek medical attention immediately by calling 911 or calling your MD immediately  if symptoms less severe.  You Must read complete instructions/literature along with all the possible adverse reactions/side effects for all the Medicines you take and that have been prescribed to you. Take any new Medicines after you have completely understood and accept all the  possible adverse reactions/side effects.   Please note  You were cared for by a hospitalist during your hospital stay. If you have any questions about your discharge medications or the care you received while you were in the hospital after you are discharged, you can call the unit and asked to speak with the hospitalist on call if the hospitalist that took care of you is not available. Once you are discharged, your primary care physician will handle any further medical issues. Please note that NO REFILLS for any discharge medications will be authorized once you are discharged, as it is imperative that you return to your primary care physician (or establish a relationship with a primary care physician if you do not have one) for your aftercare needs so that they can reassess your need for medications and monitor your lab values.    Today   SUBJECTIVE   No complaint.   VITAL SIGNS:  Blood pressure 152/98, pulse 61, temperature 97.7 F (36.5 C), temperature source Oral, resp. rate 17, height 5\' 7"  (1.702 m), weight 78.064 kg (172 lb 1.6 oz), SpO2 99 %.  I/O:   Intake/Output Summary (Last 24 hours) at 11/21/14 1147 Last data filed at 11/21/14 1040  Gross per 24 hour  Intake 3928.17 ml  Output   3150 ml  Net 778.17 ml    PHYSICAL EXAMINATION:  GENERAL:  54 y.o.-year-old patient lying in the bed with no acute distress.  EYES: Pupils equal, round, reactive to light and accommodation. No scleral icterus. Extraocular muscles intact.  HEENT: Head atraumatic, normocephalic. Oropharynx and nasopharynx clear.  NECK:  Supple, no jugular venous distention. No thyroid enlargement, no tenderness.  LUNGS: Normal breath sounds bilaterally, no wheezing, rales,rhonchi or crepitation. No use of accessory muscles of respiration.  CARDIOVASCULAR: S1, S2 normal. No murmurs, rubs, or gallops.  ABDOMEN: Soft, non-tender, non-distended. Bowel sounds present. No organomegaly or mass.  EXTREMITIES: No pedal  edema, cyanosis, or clubbing.  NEUROLOGIC: Cranial nerves II through XII are intact. Muscle strength 5/5 in all extremities. Sensation intact. Gait not checked.  PSYCHIATRIC: The patient is alert and oriented x 3.  SKIN: No obvious rash, lesion, or ulcer.   DATA REVIEW:   CBC  Recent Labs Lab 11/20/14 0243  WBC 8.4  HGB 12.8*  HCT 37.5*  PLT 199    Chemistries   Recent Labs Lab 11/20/14 0243 11/21/14 0829  NA 137 138  K 4.2 4.6  CL 106 114*  CO2 20* 20*  GLUCOSE 153* 98  BUN 62* 33*  CREATININE 5.09* 0.98  CALCIUM 9.2 8.4*  AST 20  --   ALT 17  --   ALKPHOS 74  --   BILITOT 0.3  --     Cardiac Enzymes  Recent Labs Lab 11/20/14 0243  TROPONINI <0.03    Microbiology Results  Results for orders placed or performed during the hospital encounter of 11/06/14  C difficile quick scan w PCR reflex ([facility] only)     Status: None  Collection Time: 11/07/14  2:35 PM  Result Value Ref Range Status   C Diff antigen NEGATIVE  Final   C Diff toxin NEGATIVE  Final   C Diff interpretation Negative for C. difficile  Final    RADIOLOGY:  Ct Head Wo Contrast  11/20/2014   CLINICAL DATA:  Syncopal episode, dizziness after THC inhalation. Similar symptoms previously. Slurred speech.  EXAM: CT HEAD WITHOUT CONTRAST  TECHNIQUE: Contiguous axial images were obtained from the base of the skull through the vertex without intravenous contrast.  COMPARISON:  None.  FINDINGS: Mildly motion degraded examination.  The ventricles and sulci are upper limits of normal for age. No intraparenchymal hemorrhage, mass effect nor midline shift. No acute large vascular territory infarcts.  No abnormal extra-axial fluid collections. Basal cisterns are patent.  No skull fracture. Old mildly depressed LEFT nasal bone fracture. Small apparent LEFT parietal scalp hematoma. The included ocular globes and orbital contents are non-suspicious. Mild paranasal sinus mucosal thickening, scattered mucosal retention  cyst. The mastoid air cells are well aerated.  IMPRESSION: No acute intracranial process on this mildly motion degraded examination.  Small apparent LEFT parietal scalp hematoma, recommend correlation with point tenderness. No skull fracture.  Borderline parenchymal brain volume loss for age.   Electronically Signed   By: Elon Alas M.D.   On: 11/20/2014 04:47        Management plans discussed with the patient, family and they are in agreement.  CODE STATUS:     Code Status Orders        Start     Ordered   11/20/14 0611  Full code   Continuous     11/20/14 0610      TOTAL TIME TAKING CARE OF THIS PATIENT: 36 minutes.    Demetrios Loll M.D on 11/21/2014 at 11:47 AM  Between 7am to 6pm - Pager - [phone]  After 6pm go to [email or web address] - password [email or web address] [facility]  [facility]  Office  [phone]  CC: Primary care physician; No PCP Per Patient

## 2014-11-21 NOTE — Progress Notes (Signed)
[facility]  ROUNDING NOTE   Subjective:   Patient in good mood.  NS at 171ml/hr  Objective:  Vital signs in last 24 hours:  Temp:  [97.7 F (36.5 C)-98.2 F (36.8 C)] 97.7 F (36.5 C) (07/30 0756) Pulse Rate:  [61-81] 61 (07/30 0756) Resp:  [17-18] 17 (07/30 0756) BP: (131-172)/(86-98) 152/98 mmHg (07/30 0800) SpO2:  [94 %-99 %] 99 % (07/30 0756) Weight:  [78.064 kg (172 lb 1.6 oz)] 78.064 kg (172 lb 1.6 oz) (07/30 0500)  Weight change: 5.489 kg (12 lb 1.6 oz) Filed Weights   11/20/14 0240 11/20/14 0608 11/21/14 0500  Weight: 72.576 kg (160 lb) 73.71 kg (162 lb 8 oz) 78.064 kg (172 lb 1.6 oz)    Intake/Output: I/O last 3 completed shifts: In: 3748.4 [P.O.:1314; I.V.:2434.4] Out: 2250 [Urine:2250]   Intake/Output this shift:  Total I/O In: 326 [P.O.:120; I.V.:206] Out: 400 [Urine:400]  Physical Exam: General: NAD, disheveled   Head: Normocephalic, atraumatic. Moist oral mucosal membranes  Eyes: Anicteric, PERRL  Neck: Supple, trachea midline  Lungs:  Clear to auscultation  Heart: Regular rate and rhythm  Abdomen:  Soft, nontender,   Extremities:  no peripheral edema.  Neurologic: Nonfocal, moving all four extremities  Skin: No lesions       Basic Metabolic Panel:  Recent Labs Lab 11/20/14 0243 11/21/14 0829  NA 137 138  K 4.2 4.6  CL 106 114*  CO2 20* 20*  GLUCOSE 153* 98  BUN 62* 33*  CREATININE 5.09* 0.98  CALCIUM 9.2 8.4*    Liver Function Tests:  Recent Labs Lab 11/20/14 0243  AST 20  ALT 17  ALKPHOS 74  BILITOT 0.3  PROT 7.0  ALBUMIN 3.8   No results for input(s): LIPASE, AMYLASE in the last 168 hours. No results for input(s): AMMONIA in the last 168 hours.  CBC:  Recent Labs Lab 11/20/14 0243  WBC 8.4  HGB 12.8*  HCT 37.5*  MCV 90.7  PLT 199    Cardiac Enzymes:  Recent Labs Lab 11/20/14 0243  TROPONINI <0.03    BNP: Invalid input(s): POCBNP  CBG:  Recent Labs Lab 11/20/14 0256  GLUCAP 160*     Microbiology: Results for orders placed or performed during the hospital encounter of 11/06/14  C difficile quick scan w PCR reflex ([facility] only)     Status: None   Collection Time: 11/07/14  2:35 PM  Result Value Ref Range Status   C Diff antigen NEGATIVE  Final   C Diff toxin NEGATIVE  Final   C Diff interpretation Negative for C. difficile  Final    Coagulation Studies: No results for input(s): LABPROT, INR in the last 72 hours.  Urinalysis:  Recent Labs  11/20/14 0515  COLORURINE YELLOW*  LABSPEC 1.015  PHURINE 5.0  GLUCOSEU NEGATIVE  HGBUR 1+*  BILIRUBINUR NEGATIVE  KETONESUR NEGATIVE  PROTEINUR NEGATIVE  NITRITE NEGATIVE  LEUKOCYTESUR NEGATIVE      Imaging: Ct Head Wo Contrast  11/20/2014   CLINICAL DATA:  Syncopal episode, dizziness after THC inhalation. Similar symptoms previously. Slurred speech.  EXAM: CT HEAD WITHOUT CONTRAST  TECHNIQUE: Contiguous axial images were obtained from the base of the skull through the vertex without intravenous contrast.  COMPARISON:  None.  FINDINGS: Mildly motion degraded examination.  The ventricles and sulci are upper limits of normal for age. No intraparenchymal hemorrhage, mass effect nor midline shift. No acute large vascular territory infarcts.  No abnormal extra-axial fluid collections. Basal cisterns are patent.  No skull fracture. Old mildly depressed LEFT nasal bone fracture. Small apparent LEFT parietal scalp hematoma. The included ocular globes and orbital contents are non-suspicious. Mild paranasal sinus mucosal thickening, scattered mucosal retention cyst. The mastoid air cells are well aerated.  IMPRESSION: No acute intracranial process on this mildly motion degraded examination.  Small apparent LEFT parietal scalp hematoma, recommend correlation with point tenderness. No skull fracture.  Borderline parenchymal brain volume loss for age.   Electronically Signed   By: Elon Alas M.D.   On: 11/20/2014 04:47      Medications:   . sodium chloride 125 mL/hr at 11/21/14 0232   . amitriptyline  50 mg Oral QHS  . amLODipine  10 mg Oral Daily  . docusate sodium  100 mg Oral Daily  . heparin  5,000 Units Subcutaneous 3 times per day  . levothyroxine  100 mcg Oral QAC breakfast  . nicotine  14 mg Transdermal Daily  . pantoprazole  40 mg Oral Daily  . pravastatin  10 mg Oral QHS  . QUEtiapine  25 mg Oral TID AC  . QUEtiapine  300 mg Oral QHS  . sodium chloride  3 mL Intravenous Q12H  . venlafaxine  50 mg Oral BID WC   acetaminophen **OR** acetaminophen, albuterol, alum & mag hydroxide-simeth, ondansetron **OR** ondansetron (ZOFRAN) IV  Assessment/ Plan:  Mr. Jarick Harkins is a 54 y.o.white male with hypertension, hyperlipidemia, hypothyroidism, COPD, irritable bowel syndrome, depression, back surgery, who was admitted to [facility] on 11/20/2014 for fall.   1. Acute Renal Failure: Baseline creatinine of 0.79 on 7/17.  Renal function improved significantly leading to believe either due to lisinopril or prerenal azotemia or both.  - Agree with aggressive IV fluids and holding lisinopril.  - Recommend not restarting lisinopril at this time.   2. Hypertension: hypotensive on admission. Now blood pressures elevated.  -  Now on amlodipine.   3. Hyperlipidemia: not currently on a statin. Elevated lipids on 09/25/2014  4. Hematuria and proteinuria: on urinalysis. May be due to acute renal failure, ATN or dehydration. Will repeat urine studies as an outpatient.    LOS: 1 Christopher Mason 7/30/20169:55 AM

## 2014-11-21 NOTE — Progress Notes (Signed)
Pt was given discharge instructions. Pt understands and has no questions. All 3 IVs were taken out, no complications. Pt is awaiting transportation to pick him up.

## 2014-11-21 NOTE — Discharge Instructions (Signed)
Low sodium and low fat diet. Activity as tolerated. Drink plenty of fluids.  Notify your doctor if your symptoms return. Take all medications as prescribed.  Notify your doctor with any questions or concerns.

## 2015-01-17 ENCOUNTER — Emergency Department: Payer: Medicaid Other

## 2015-01-17 ENCOUNTER — Emergency Department
Admission: EM | Admit: 2015-01-17 | Discharge: 2015-01-17 | Disposition: A | Discharge status: Home / Self Care | Payer: Medicaid Other | Attending: Emergency Medicine | Admitting: Emergency Medicine

## 2015-01-17 ENCOUNTER — Encounter: Payer: Self-pay | Admitting: Emergency Medicine

## 2015-01-17 DIAGNOSIS — M7989 Other specified soft tissue disorders: Secondary | ICD-10-CM

## 2015-01-17 DIAGNOSIS — I10 Essential (primary) hypertension: Secondary | ICD-10-CM | POA: Insufficient documentation

## 2015-01-17 DIAGNOSIS — Z79899 Other long term (current) drug therapy: Secondary | ICD-10-CM | POA: Insufficient documentation

## 2015-01-17 DIAGNOSIS — Z720 Tobacco use: Secondary | ICD-10-CM | POA: Insufficient documentation

## 2015-01-17 DIAGNOSIS — R2231 Localized swelling, mass and lump, right upper limb: Secondary | ICD-10-CM | POA: Insufficient documentation

## 2015-01-17 MED ORDER — SULFAMETHOXAZOLE-TRIMETHOPRIM 800-160 MG PO TABS: 1.0000 | ORAL_TABLET | Freq: Two times a day (BID) | ORAL | Status: DC

## 2015-01-17 MED ORDER — TRAMADOL HCL 50 MG PO TABS
50.0000 mg | ORAL_TABLET | Freq: Once | ORAL | Status: AC
  Administered 2015-01-17: 50 mg via ORAL
  Filled 2015-01-17: qty 1

## 2015-01-17 MED ORDER — TRAMADOL HCL 50 MG PO TABS: 50.0000 mg | ORAL_TABLET | Freq: Two times a day (BID) | ORAL | Status: DC

## 2015-01-17 NOTE — ED Provider Notes (Signed)
[facility] Emergency Department Provider Note ____________________________________________  Time seen: 2150  I have reviewed the triage vital signs and the nursing notes.  HISTORY  Chief Complaint  Hand Pain  HPI Christopher Mason is a 53 y.o. male reports to the ED for evaluation of swelling to the right hand since last Wednesday. He denies any known injury, or trauma, but thinks he may been bitten by a spider. He denies any itching, warmth, or drainage from the hand is not aware of any particular bite site.  Past Medical History  Diagnosis Date  . Depression   . Hypertension   . Hypothyroid   . COPD (chronic obstructive pulmonary disease)   . Irritable bowel syndrome (IBS)     Patient Active Problem List   Diagnosis Date Noted  . Acute kidney injury 11/20/2014  . ARF (acute renal failure) 11/06/2014  . Hypokalemia 11/06/2014  . Hypotension 11/06/2014  . Cannabis use disorder, severe, dependence 09/24/2014  . Undifferentiated schizophrenia   . Hypothyroidism 09/17/2014  . GERD (gastroesophageal reflux disease) 09/17/2014  . Tobacco use disorder 09/17/2014  . Non compliance w medication regimen 09/16/2014  . Asthma 09/16/2014  . HTN (hypertension) 09/16/2014  . Tardive dyskinesia 09/16/2014    Past Surgical History  Procedure Laterality Date  . Back surgery    . Rotator cuff repair      2007  . Carpal tunnel release Bilateral     Current Outpatient Rx  Name  Route  Sig  Dispense  Refill  . albuterol (PROVENTIL HFA;VENTOLIN HFA) 108 (90 BASE) MCG/ACT inhaler   Inhalation   Inhale 2 puffs into the lungs every 4 (four) hours as needed for wheezing or shortness of breath.   1 each   0   . amitriptyline (ELAVIL) 50 MG tablet   Oral   Take 1 tablet (50 mg total) by mouth at bedtime.   30 tablet   0   . amLODipine (NORVASC) 10 MG tablet   Oral   Take 1 tablet (10 mg total) by mouth daily.   30 tablet   0   . docusate sodium (COLACE) 100  MG capsule   Oral   Take 1 capsule (100 mg total) by mouth daily.   30 capsule   0   . levothyroxine (SYNTHROID, LEVOTHROID) 75 MCG tablet   Oral   Take 1 tablet (75 mcg total) by mouth daily.   30 tablet   0   . nicotine (NICODERM CQ - DOSED IN MG/24 HOURS) 14 mg/24hr patch   Transdermal   Place 1 patch (14 mg total) onto the skin daily. Patient not taking: Reported on 11/20/2014   28 patch   0   . omeprazole (PRILOSEC) 20 MG capsule   Oral   Take 1 capsule (20 mg total) by mouth daily.   30 capsule   0   . pravastatin (PRAVACHOL) 10 MG tablet   Oral   Take 1 tablet (10 mg total) by mouth at bedtime.   30 tablet   0   . QUEtiapine (SEROQUEL) 25 MG tablet   Oral   Take 1 tablet (25 mg total) by mouth 3 (three) times daily before meals.   90 tablet   0   . QUEtiapine (SEROQUEL) 300 MG tablet   Oral   Take 1 tablet (300 mg total) by mouth at bedtime.   30 tablet   0   . sulfamethoxazole-trimethoprim (BACTRIM DS,SEPTRA DS) 800-160 MG per tablet   Oral  Take 1 tablet by mouth 2 (two) times daily.   20 tablet   0   . traMADol (ULTRAM) 50 MG tablet   Oral   Take 1 tablet (50 mg total) by mouth 2 (two) times daily.   10 tablet   0   . venlafaxine (EFFEXOR) 50 MG tablet   Oral   Take 1 tablet (50 mg total) by mouth 2 (two) times daily with a meal. With breakfats and lunch. Patient taking differently: Take 50 mg by mouth 2 (two) times daily with a meal.    60 tablet   0     Allergies Aspirin  Family History  Problem Relation Age of Onset  . Dementia Mother   . Hypertension Father   . Cataracts Father   . Diabetes Sister   . Diabetes Brother     Social History Social History  Substance Use Topics  . Smoking status: Current Every Day Smoker -- 1.00 packs/day    Types: Cigarettes  . Smokeless tobacco: Never Used  . Alcohol Use: No   Review of Systems  Constitutional: Negative for fever. Eyes: Negative for visual changes. ENT: Negative for  sore throat. Cardiovascular: Negative for chest pain. Respiratory: Negative for shortness of breath. Gastrointestinal: Negative for abdominal pain, vomiting and diarrhea. Genitourinary: Negative for dysuria. Musculoskeletal: Negative for back pain. Dorsal right hand swelling as above. Skin: Negative for rash. Neurological: Negative for headaches, focal weakness or numbness. ____________________________________________  PHYSICAL EXAM:  VITAL SIGNS: ED Triage Vitals  Enc Vitals Group     BP 01/17/15 1853 118/86 mmHg     Pulse Rate 01/17/15 1853 71     Resp 01/17/15 1853 20     Temp 01/17/15 1853 98.2 F (36.8 C)     Temp Source 01/17/15 1853 Oral     SpO2 01/17/15 1853 97 %     Weight 01/17/15 1853 160 lb (72.576 kg)     Height 01/17/15 1853 5\' 7"  (1.702 m)     Head Cir --      Peak Flow --      Pain Score 01/17/15 1853 9     Pain Loc --      Pain Edu? --      Excl. in [place]? --    Constitutional: Alert and oriented. Well appearing and in no distress. Eyes: Conjunctivae are normal. PERRL. Normal extraocular movements. ENT   Head: Normocephalic and atraumatic.   Nose: No congestion/rhinorrhea.   Mouth/Throat: Mucous membranes are moist.   Neck: Supple. No thyromegaly. Hematological/Lymphatic/Immunological: No cervical lymphadenopathy. Cardiovascular: Normal rate, regular rhythm. Normal distal pulses. Respiratory: Normal respiratory effort. No wheezes/rales/rhonchi. Gastrointestinal: Soft and nontender. No distention. Musculoskeletal: Right hand with obvious dorsal soft tissue swelling without any obvious erythema, warmth, induration, puncture site, or drainage. He has no noted lymphangitis, or signs of cellulitis. His with a normal composite fist on the right hand. Nontender with normal range of motion in all extremities.  Neurologic:  Normal gait without ataxia. Normal speech and language. No gross focal neurologic deficits are appreciated. Skin:  Skin is warm, dry  and intact. No rash noted. Psychiatric: Mood and affect are normal. Patient exhibits appropriate insight and judgment. ____________________________________________   RADIOLOGY Right hand IMPRESSION: Dorsal soft tissue edema. No acute osseous abnormality, soft tissue air or foreign body.  I, Menshew, Dannielle Karvonen, personally viewed and evaluated these images (plain radiographs) as part of my medical decision making.  ____________________________________________  PROCEDURES   Ultram 50 mg PO ____________________________________________  INITIAL IMPRESSION / ASSESSMENT AND PLAN / ED COURSE  No radiologic evidence of retained foreign body, or air, to indicate infectious process. Patient will be treated empirically with Bactrim for hands a lot. He will follow his primary care provider for ongoing symptoms. ____________________________________________  FINAL CLINICAL IMPRESSION(S) / ED DIAGNOSES  Final diagnoses:  Swelling of right hand      Melvenia Needles, PA-C 01/18/15 0122  Eula Listen, MD 01/29/15 1287

## 2015-01-17 NOTE — Discharge Instructions (Signed)
Edema °Edema is an abnormal buildup of fluids in your body tissues. Edema is somewhat dependent on gravity to pull the fluid to the lowest place in your body. That makes the condition more common in the legs and thighs (lower extremities). Painless swelling of the feet and ankles is common and becomes more likely as you get older. It is also common in looser tissues, like around your eyes.  °When the affected area is squeezed, the fluid may move out of that spot and leave a dent for a few moments. This dent is called pitting.  °CAUSES  °There are many possible causes of edema. Eating too much salt and being on your feet or sitting for a long time can cause edema in your legs and ankles. Hot weather may make edema worse. Common medical causes of edema include: °· Heart failure. °· Liver disease. °· Kidney disease. °· Weak blood vessels in your legs. °· Cancer. °· An injury. °· Pregnancy. °· Some medications. °· Obesity.  °SYMPTOMS  °Edema is usually painless. Your skin may look swollen or shiny.  °DIAGNOSIS  °Your health care provider may be able to diagnose edema by asking about your medical history and doing a physical exam. You may need to have tests such as X-rays, an electrocardiogram, or blood tests to check for medical conditions that may cause edema.  °TREATMENT  °Edema treatment depends on the cause. If you have heart, liver, or kidney disease, you need the treatment appropriate for these conditions. General treatment may include: °· Elevation of the affected body part above the level of your heart. °· Compression of the affected body part. Pressure from elastic bandages or support stockings squeezes the tissues and forces fluid back into the blood vessels. This keeps fluid from entering the tissues. °· Restriction of fluid and salt intake. °· Use of a water pill (diuretic). These medications are appropriate only for some types of edema. They pull fluid out of your body and make you urinate more often. This  gets rid of fluid and reduces swelling, but diuretics can have side effects. Only use diuretics as directed by your health care provider. °HOME CARE INSTRUCTIONS  °· Keep the affected body part above the level of your heart when you are lying down.   °· Do not sit still or stand for prolonged periods.   °· Do not put anything directly under your knees when lying down. °· Do not wear constricting clothing or garters on your upper legs.   °· Exercise your legs to work the fluid back into your blood vessels. This may help the swelling go down.   °· Wear elastic bandages or support stockings to reduce ankle swelling as directed by your health care provider.   °· Eat a low-salt diet to reduce fluid if your health care provider recommends it.   °· Only take medicines as directed by your health care provider.  °SEEK MEDICAL CARE IF:  °· Your edema is not responding to treatment. °· You have heart, liver, or kidney disease and notice symptoms of edema. °· You have edema in your legs that does not improve after elevating them.   °· You have sudden and unexplained weight gain. °SEEK IMMEDIATE MEDICAL CARE IF:  °· You develop shortness of breath or chest pain.   °· You cannot breathe when you lie down. °· You develop pain, redness, or warmth in the swollen areas.   °· You have heart, liver, or kidney disease and suddenly get edema. °· You have a fever and your symptoms suddenly get worse. °MAKE SURE YOU:  °·   Understand these instructions.  Will watch your condition.  Will get help right away if you are not doing well or get worse. Document Released: 04/10/2005 Document Revised: 08/25/2013 Document Reviewed: 01/31/2013 [facility] Patient Information 2015 [place], [place]. This information is not intended to replace advice given to you by your health care provider. Make sure you discuss any questions you have with your health care provider.   Take the prescription antibiotic and pain medicine as directed. Apply ice to the  hand. Follow-up with your provider as needed.

## 2015-01-17 NOTE — ED Notes (Signed)
Pt presents to the ER from home with complaints of right hand pain since Wednesday. Pt reports he thinks he may had a spider bite. Pt denies any other medical complaint, visible swelling noted to right hand.

## 2015-02-06 ENCOUNTER — Observation Stay
Admission: EM | Admit: 2015-02-06 | Discharge: 2015-02-07 | Disposition: A | Discharge status: Home / Self Care | Payer: Medicaid Other | Attending: Internal Medicine | Admitting: Internal Medicine

## 2015-02-06 DIAGNOSIS — E871 Hypo-osmolality and hyponatremia: Principal | ICD-10-CM

## 2015-02-06 DIAGNOSIS — K219 Gastro-esophageal reflux disease without esophagitis: Secondary | ICD-10-CM | POA: Diagnosis not present

## 2015-02-06 DIAGNOSIS — Z79899 Other long term (current) drug therapy: Secondary | ICD-10-CM | POA: Insufficient documentation

## 2015-02-06 DIAGNOSIS — F1220 Cannabis dependence, uncomplicated: Secondary | ICD-10-CM | POA: Diagnosis not present

## 2015-02-06 DIAGNOSIS — F329 Major depressive disorder, single episode, unspecified: Secondary | ICD-10-CM | POA: Diagnosis not present

## 2015-02-06 DIAGNOSIS — Z9114 Patient's other noncompliance with medication regimen: Secondary | ICD-10-CM | POA: Insufficient documentation

## 2015-02-06 DIAGNOSIS — R300 Dysuria: Secondary | ICD-10-CM | POA: Diagnosis not present

## 2015-02-06 DIAGNOSIS — I959 Hypotension, unspecified: Secondary | ICD-10-CM | POA: Insufficient documentation

## 2015-02-06 DIAGNOSIS — Z9049 Acquired absence of other specified parts of digestive tract: Secondary | ICD-10-CM | POA: Insufficient documentation

## 2015-02-06 DIAGNOSIS — F203 Undifferentiated schizophrenia: Secondary | ICD-10-CM | POA: Diagnosis not present

## 2015-02-06 DIAGNOSIS — K589 Irritable bowel syndrome without diarrhea: Secondary | ICD-10-CM | POA: Diagnosis not present

## 2015-02-06 DIAGNOSIS — E039 Hypothyroidism, unspecified: Secondary | ICD-10-CM | POA: Diagnosis not present

## 2015-02-06 DIAGNOSIS — M549 Dorsalgia, unspecified: Secondary | ICD-10-CM | POA: Diagnosis not present

## 2015-02-06 DIAGNOSIS — R197 Diarrhea, unspecified: Secondary | ICD-10-CM | POA: Insufficient documentation

## 2015-02-06 DIAGNOSIS — Z7951 Long term (current) use of inhaled steroids: Secondary | ICD-10-CM | POA: Insufficient documentation

## 2015-02-06 DIAGNOSIS — F17210 Nicotine dependence, cigarettes, uncomplicated: Secondary | ICD-10-CM | POA: Insufficient documentation

## 2015-02-06 DIAGNOSIS — J45909 Unspecified asthma, uncomplicated: Secondary | ICD-10-CM | POA: Diagnosis not present

## 2015-02-06 DIAGNOSIS — R110 Nausea: Secondary | ICD-10-CM | POA: Insufficient documentation

## 2015-02-06 DIAGNOSIS — Z820 Family history of epilepsy and other diseases of the nervous system: Secondary | ICD-10-CM | POA: Insufficient documentation

## 2015-02-06 DIAGNOSIS — K2970 Gastritis, unspecified, without bleeding: Secondary | ICD-10-CM | POA: Diagnosis not present

## 2015-02-06 DIAGNOSIS — R1033 Periumbilical pain: Secondary | ICD-10-CM

## 2015-02-06 DIAGNOSIS — I10 Essential (primary) hypertension: Secondary | ICD-10-CM | POA: Diagnosis not present

## 2015-02-06 DIAGNOSIS — Z833 Family history of diabetes mellitus: Secondary | ICD-10-CM | POA: Diagnosis not present

## 2015-02-06 DIAGNOSIS — J449 Chronic obstructive pulmonary disease, unspecified: Secondary | ICD-10-CM | POA: Diagnosis not present

## 2015-02-06 DIAGNOSIS — N179 Acute kidney failure, unspecified: Secondary | ICD-10-CM | POA: Diagnosis not present

## 2015-02-06 DIAGNOSIS — G2401 Drug induced subacute dyskinesia: Secondary | ICD-10-CM | POA: Insufficient documentation

## 2015-02-06 DIAGNOSIS — E876 Hypokalemia: Secondary | ICD-10-CM | POA: Diagnosis not present

## 2015-02-06 DIAGNOSIS — Z8249 Family history of ischemic heart disease and other diseases of the circulatory system: Secondary | ICD-10-CM | POA: Diagnosis not present

## 2015-02-06 DIAGNOSIS — K760 Fatty (change of) liver, not elsewhere classified: Secondary | ICD-10-CM | POA: Diagnosis not present

## 2015-02-06 DIAGNOSIS — E785 Hyperlipidemia, unspecified: Secondary | ICD-10-CM | POA: Diagnosis not present

## 2015-02-06 DIAGNOSIS — Z886 Allergy status to analgesic agent status: Secondary | ICD-10-CM | POA: Insufficient documentation

## 2015-02-06 HISTORY — DX: Hyperlipidemia, unspecified: E78.5

## 2015-02-06 HISTORY — DX: Gastro-esophageal reflux disease without esophagitis: K21.9

## 2015-02-06 LAB — COMPREHENSIVE METABOLIC PANEL
ALBUMIN: 4 g/dL (ref 3.5–5.0)
ALT: 167 U/L — ABNORMAL HIGH (ref 17–63)
AST: 325 U/L — AB (ref 15–41)
Alkaline Phosphatase: 77 U/L (ref 38–126)
Anion gap: 8 (ref 5–15)
BUN: 15 mg/dL (ref 6–20)
CHLORIDE: 96 mmol/L — AB (ref 101–111)
CO2: 23 mmol/L (ref 22–32)
Calcium: 8.1 mg/dL — ABNORMAL LOW (ref 8.9–10.3)
Creatinine, Ser: 1.13 mg/dL (ref 0.61–1.24)
GFR calc Af Amer: >60 mL/min (ref >60)
GFR calc non Af Amer: >60 mL/min (ref >60)
GLUCOSE: 92 mg/dL (ref 65–99)
Potassium: 3.2 mmol/L — ABNORMAL LOW (ref 3.5–5.1)
Sodium: 127 mmol/L — ABNORMAL LOW (ref 135–145)
Total Bilirubin: 0.2 mg/dL — ABNORMAL LOW (ref 0.3–1.2)
Total Protein: 7.1 g/dL (ref 6.5–8.1)

## 2015-02-06 LAB — CBC WITH DIFFERENTIAL/PLATELET
BASOS ABS: 0.1 10*3/uL (ref 0–0.1)
Basophils Relative: 1 %
EOS PCT: 5 %
Eosinophils Absolute: 0.5 10*3/uL (ref 0–0.7)
HCT: 41.4 % (ref 40.0–52.0)
Hemoglobin: 14.4 g/dL (ref 13.0–18.0)
Lymphocytes Relative: 22 %
Lymphs Abs: 2.1 10*3/uL (ref 1.0–3.6)
MCH: 31.7 pg (ref 26.0–34.0)
MCHC: 34.9 g/dL (ref 32.0–36.0)
MCV: 90.9 fL (ref 80.0–100.0)
MONO ABS: 0.5 10*3/uL (ref 0.2–1.0)
Monocytes Relative: 5 %
Neutro Abs: 6.5 10*3/uL (ref 1.4–6.5)
Neutrophils Relative %: 67 %
PLATELETS: 198 10*3/uL (ref 150–440)
RBC: 4.55 MIL/uL (ref 4.40–5.90)
RDW: 16.1 % — ABNORMAL HIGH (ref 11.5–14.5)
WBC: 9.7 10*3/uL (ref 3.8–10.6)

## 2015-02-06 LAB — LIPASE, BLOOD: LIPASE: 24 U/L (ref 22–51)

## 2015-02-06 MED ORDER — SODIUM CHLORIDE 0.9 % IV BOLUS (SEPSIS)
1000.0000 mL | Freq: Once | INTRAVENOUS | Status: AC
  Administered 2015-02-07: 1000 mL via INTRAVENOUS

## 2015-02-06 NOTE — ED Notes (Signed)
Pt c/o abd pain since approx 330 pm today. Denies vomiting. +nausea and diarrhea. +dysuria.

## 2015-02-07 ENCOUNTER — Emergency Department: Payer: Medicaid Other

## 2015-02-07 DIAGNOSIS — E871 Hypo-osmolality and hyponatremia: Secondary | ICD-10-CM | POA: Diagnosis present

## 2015-02-07 DIAGNOSIS — K2970 Gastritis, unspecified, without bleeding: Secondary | ICD-10-CM

## 2015-02-07 DIAGNOSIS — J449 Chronic obstructive pulmonary disease, unspecified: Secondary | ICD-10-CM | POA: Diagnosis present

## 2015-02-07 DIAGNOSIS — E785 Hyperlipidemia, unspecified: Secondary | ICD-10-CM | POA: Diagnosis present

## 2015-02-07 HISTORY — DX: Hypo-osmolality and hyponatremia: E87.1

## 2015-02-07 HISTORY — DX: Gastritis, unspecified, without bleeding: K29.70

## 2015-02-07 LAB — URINALYSIS COMPLETE WITH MICROSCOPIC (ARMC ONLY)
BACTERIA UA: NONE SEEN
Bilirubin Urine: NEGATIVE
Glucose, UA: NEGATIVE mg/dL
Hgb urine dipstick: NEGATIVE
Ketones, ur: NEGATIVE mg/dL
Leukocytes, UA: NEGATIVE
NITRITE: NEGATIVE
Protein, ur: NEGATIVE mg/dL
SQUAMOUS EPITHELIAL / LPF: NONE SEEN
Specific Gravity, Urine: 1.021 (ref 1.005–1.030)
pH: 6 (ref 5.0–8.0)

## 2015-02-07 LAB — BASIC METABOLIC PANEL
Anion gap: 6 (ref 5–15)
BUN: 15 mg/dL (ref 6–20)
CHLORIDE: 107 mmol/L (ref 101–111)
CO2: 25 mmol/L (ref 22–32)
CREATININE: 0.92 mg/dL (ref 0.61–1.24)
Calcium: 8.6 mg/dL — ABNORMAL LOW (ref 8.9–10.3)
GFR calc Af Amer: >60 mL/min (ref >60)
GLUCOSE: 88 mg/dL (ref 65–99)
Potassium: 3.6 mmol/L (ref 3.5–5.1)
SODIUM: 138 mmol/L (ref 135–145)

## 2015-02-07 LAB — TROPONIN I

## 2015-02-07 LAB — ETHANOL: Alcohol, Ethyl (B): <5 mg/dL (ref <5)

## 2015-02-07 LAB — LACTIC ACID, PLASMA: Lactic Acid, Venous: 0.8 mmol/L (ref 0.5–2.0)

## 2015-02-07 MED ORDER — SODIUM CHLORIDE 0.9 % IV SOLN
INTRAVENOUS | Status: DC
  Administered 2015-02-07: 07:00:00 via INTRAVENOUS

## 2015-02-07 MED ORDER — VENLAFAXINE HCL 50 MG PO TABS
50.0000 mg | ORAL_TABLET | Freq: Two times a day (BID) | ORAL | Status: DC
  Filled 2015-02-07 (×3): qty 1

## 2015-02-07 MED ORDER — LEVOTHYROXINE SODIUM 75 MCG PO TABS
75.0000 ug | ORAL_TABLET | Freq: Every day | ORAL | Status: DC
  Administered 2015-02-07: 75 ug via ORAL
  Filled 2015-02-07: qty 1

## 2015-02-07 MED ORDER — HYDRALAZINE HCL 20 MG/ML IJ SOLN: 10.0000 mg | Freq: Once | INTRAMUSCULAR | Status: DC

## 2015-02-07 MED ORDER — IOHEXOL 240 MG/ML SOLN
25.0000 mL | Freq: Once | INTRAMUSCULAR | Status: AC | PRN
  Administered 2015-02-07: 25 mL via ORAL

## 2015-02-07 MED ORDER — ACETAMINOPHEN 325 MG PO TABS: 650.0000 mg | ORAL_TABLET | Freq: Four times a day (QID) | ORAL | Status: DC | PRN

## 2015-02-07 MED ORDER — PRAVASTATIN SODIUM 10 MG PO TABS: 10.0000 mg | ORAL_TABLET | Freq: Every day | ORAL | Status: DC

## 2015-02-07 MED ORDER — IOHEXOL 300 MG/ML  SOLN
100.0000 mL | Freq: Once | INTRAMUSCULAR | Status: AC | PRN
  Administered 2015-02-07: 100 mL via INTRAVENOUS

## 2015-02-07 MED ORDER — ONDANSETRON HCL 4 MG PO TABS: 4.0000 mg | ORAL_TABLET | Freq: Four times a day (QID) | ORAL | Status: DC | PRN

## 2015-02-07 MED ORDER — ACETAMINOPHEN 650 MG RE SUPP
650.0000 mg | Freq: Four times a day (QID) | RECTAL | Status: DC | PRN
Start: 2015-02-07 — End: 2015-02-07

## 2015-02-07 MED ORDER — HYDROCODONE-ACETAMINOPHEN 5-325 MG PO TABS: 1.0000 | ORAL_TABLET | ORAL | Status: DC | PRN

## 2015-02-07 MED ORDER — ALBUTEROL SULFATE (2.5 MG/3ML) 0.083% IN NEBU: 3.0000 mL | INHALATION_SOLUTION | RESPIRATORY_TRACT | Status: DC | PRN

## 2015-02-07 MED ORDER — AMLODIPINE BESYLATE 10 MG PO TABS
10.0000 mg | ORAL_TABLET | Freq: Every day | ORAL | Status: DC
  Administered 2015-02-07: 10 mg via ORAL
  Filled 2015-02-07: qty 1

## 2015-02-07 MED ORDER — QUETIAPINE FUMARATE 25 MG PO TABS
25.0000 mg | ORAL_TABLET | Freq: Three times a day (TID) | ORAL | Status: DC
  Administered 2015-02-07: 25 mg via ORAL
  Filled 2015-02-07: qty 1

## 2015-02-07 MED ORDER — PANTOPRAZOLE SODIUM 40 MG PO TBEC
40.0000 mg | DELAYED_RELEASE_TABLET | Freq: Every day | ORAL | Status: DC
  Administered 2015-02-07: 40 mg via ORAL
  Filled 2015-02-07: qty 1

## 2015-02-07 MED ORDER — ONDANSETRON HCL 4 MG/2ML IJ SOLN: 4.0000 mg | Freq: Four times a day (QID) | INTRAMUSCULAR | Status: DC | PRN

## 2015-02-07 MED ORDER — QUETIAPINE FUMARATE 100 MG PO TABS: 300.0000 mg | ORAL_TABLET | Freq: Every day | ORAL | Status: DC

## 2015-02-07 MED ORDER — ENOXAPARIN SODIUM 40 MG/0.4ML ~~LOC~~ SOLN
40.0000 mg | SUBCUTANEOUS | Status: DC
  Filled 2015-02-07: qty 0.4

## 2015-02-07 MED ORDER — HYDRALAZINE HCL 20 MG/ML IJ SOLN
10.0000 mg | INTRAMUSCULAR | Status: DC | PRN
  Administered 2015-02-07: 10 mg via INTRAVENOUS
  Filled 2015-02-07: qty 1

## 2015-02-07 NOTE — ED Notes (Signed)
Spoke with Dr. Jannifer Franklin about pt blood pressure of 149/105, order given for hydralazine. MD aware of patient heart rate 56. MS states okay to give hydralazine.

## 2015-02-07 NOTE — ED Provider Notes (Signed)
[facility] Emergency Department Provider Note  ____________________________________________  Time seen: Approximately 23:10 PM  I have reviewed the triage vital signs and the nursing notes.   HISTORY  Chief Complaint Abdominal Pain    HPI Christopher Mason is a 54 y.o. male who comes in today with some abdominal pain. The patient reports that he felt okay earlier. He reports that he ate some package soup and was very sleepy afterwards. The patient laid on the couch and then started feeling hot and sweaty and developed some abdominal pain and cramping. The patient went to the bathroom and reports he had some vomiting and some diarrhea and felt weak afterwards. The patient also reports that he had some pain in the back of his legs. He still has some abdominal pain now that he reports is a 9 out of 10 in intensity. He reports that every time he starts to get up or move his abdomen is churning. He reports that the pain is in his mid abdominal area. The patient denies any chest pain or shortness of breath he does not have any sick contacts.The patient reports he has had some burning with his urination and frequency. He denies any headache or blurry vision. The patient comes in for evaluation and treatment of his symptoms. He did not take any medication for his pain at home.   Past Medical History  Diagnosis Date  . Depression   . Hypertension   . Hypothyroid   . COPD (chronic obstructive pulmonary disease) ([place])   . Irritable bowel syndrome (IBS)     Patient Active Problem List   Diagnosis Date Noted  . Acute kidney injury ([place]) 11/20/2014  . ARF (acute renal failure) ([place]) 11/06/2014  . Hypokalemia 11/06/2014  . Hypotension 11/06/2014  . Cannabis use disorder, severe, dependence ([place]) 09/24/2014  . Undifferentiated schizophrenia ([place])   . Hypothyroidism 09/17/2014  . GERD (gastroesophageal reflux disease) 09/17/2014  . Tobacco use disorder 09/17/2014  . Non  compliance w medication regimen 09/16/2014  . Asthma 09/16/2014  . HTN (hypertension) 09/16/2014  . Tardive dyskinesia 09/16/2014    Past Surgical History  Procedure Laterality Date  . Back surgery    . Rotator cuff repair      2007  . Carpal tunnel release Bilateral     Current Outpatient Rx  Name  Route  Sig  Dispense  Refill  . albuterol (PROVENTIL HFA;VENTOLIN HFA) 108 (90 BASE) MCG/ACT inhaler   Inhalation   Inhale 2 puffs into the lungs every 4 (four) hours as needed for wheezing or shortness of breath.   1 each   0   . amitriptyline (ELAVIL) 50 MG tablet   Oral   Take 1 tablet (50 mg total) by mouth at bedtime.   30 tablet   0   . amLODipine (NORVASC) 10 MG tablet   Oral   Take 1 tablet (10 mg total) by mouth daily.   30 tablet   0   . docusate sodium (COLACE) 100 MG capsule   Oral   Take 1 capsule (100 mg total) by mouth daily.   30 capsule   0   . levothyroxine (SYNTHROID, LEVOTHROID) 75 MCG tablet   Oral   Take 1 tablet (75 mcg total) by mouth daily.   30 tablet   0   . nicotine (NICODERM CQ - DOSED IN MG/24 HOURS) 14 mg/24hr patch   Transdermal   Place 1 patch (14 mg total) onto the skin daily. Patient not  taking: Reported on 11/20/2014   28 patch   0   . omeprazole (PRILOSEC) 20 MG capsule   Oral   Take 1 capsule (20 mg total) by mouth daily.   30 capsule   0   . pravastatin (PRAVACHOL) 10 MG tablet   Oral   Take 1 tablet (10 mg total) by mouth at bedtime.   30 tablet   0   . QUEtiapine (SEROQUEL) 25 MG tablet   Oral   Take 1 tablet (25 mg total) by mouth 3 (three) times daily before meals.   90 tablet   0   . QUEtiapine (SEROQUEL) 300 MG tablet   Oral   Take 1 tablet (300 mg total) by mouth at bedtime.   30 tablet   0   . sulfamethoxazole-trimethoprim (BACTRIM DS,SEPTRA DS) 800-160 MG per tablet   Oral   Take 1 tablet by mouth 2 (two) times daily.   20 tablet   0   . traMADol (ULTRAM) 50 MG tablet   Oral   Take 1 tablet  (50 mg total) by mouth 2 (two) times daily.   10 tablet   0   . venlafaxine (EFFEXOR) 50 MG tablet   Oral   Take 1 tablet (50 mg total) by mouth 2 (two) times daily with a meal. With breakfats and lunch. Patient taking differently: Take 50 mg by mouth 2 (two) times daily with a meal.    60 tablet   0     Allergies Aspirin  Family History  Problem Relation Age of Onset  . Dementia Mother   . Hypertension Father   . Cataracts Father   . Diabetes Sister   . Diabetes Brother     Social History Social History  Substance Use Topics  . Smoking status: Current Every Day Smoker -- 1.00 packs/day    Types: Cigarettes  . Smokeless tobacco: Never Used  . Alcohol Use: No    Review of Systems Constitutional: No fever/chills Eyes: No visual changes. ENT: No sore throat. Cardiovascular: Denies chest pain. Respiratory: Denies shortness of breath. Gastrointestinal: Abdominal pain, nausea, vomiting, diarrhea. Genitourinary: Dysuria. Musculoskeletal: Negative for back pain. Skin: Negative for rash. Neurological: Dizziness and sweats  10-point ROS otherwise negative.  ____________________________________________   PHYSICAL EXAM:  VITAL SIGNS: ED Triage Vitals  Enc Vitals Group     BP 02/06/15 1929 97/69 mmHg     Pulse Rate 02/06/15 1929 76     Resp 02/06/15 1929 18     Temp 02/06/15 1929 98.2 F (36.8 C)     Temp Source 02/06/15 1929 Oral     SpO2 02/06/15 1929 97 %     Weight 02/06/15 1929 155 lb (70.308 kg)     Height 02/06/15 1929 5\' 7"  (1.702 m)     Head Cir --      Peak Flow --      Pain Score 02/06/15 1930 8     Pain Loc --      Pain Edu? --      Excl. in [place]? --     Constitutional: Alert and oriented. Well appearing and in mild distress. Eyes: Conjunctivae are normal. PERRL. EOMI. Head: Atraumatic. Nose: No congestion/rhinnorhea. Mouth/Throat: Mucous membranes are moist.  Oropharynx non-erythematous. Cardiovascular: Normal rate, regular rhythm. Grossly  normal heart sounds.  Good peripheral circulation. Respiratory: Normal respiratory effort.  No retractions. Lungs CTAB. Gastrointestinal: Soft mid abdomen and right lower quadrant tenderness to palpation. No distention. Positive bowel sounds Musculoskeletal: No  lower extremity tenderness nor edema.   Neurologic:  Normal speech and language.  Skin:  Skin is warm, dry and intact.  Psychiatric: Mood and affect are normal.   ____________________________________________   LABS (all labs ordered are listed, but only abnormal results are displayed)  Labs Reviewed  CBC WITH DIFFERENTIAL/PLATELET - Abnormal; Notable for the following:    RDW 16.1 (*)    All other components within normal limits  COMPREHENSIVE METABOLIC PANEL - Abnormal; Notable for the following:    Sodium 127 (*)    Potassium 3.2 (*)    Chloride 96 (*)    Calcium 8.1 (*)    AST 325 (*)    ALT 167 (*)    Total Bilirubin 0.2 (*)    All other components within normal limits  URINALYSIS COMPLETEWITH MICROSCOPIC ([facility] ONLY) - Abnormal; Notable for the following:    Color, Urine YELLOW (*)    APPearance CLEAR (*)    All other components within normal limits  LIPASE, BLOOD  TROPONIN I  ETHANOL  LACTIC ACID, PLASMA   ____________________________________________  EKG  ED ECG REPORT I, Loney Hering, the attending physician, personally viewed and interpreted this ECG.   Date: 02/06/2015  EKG Time: 2215  Rate: 58  Rhythm: sinus bradycardia  Axis: normal  Intervals:none  ST&T Change: flipped t waves V2, V3  ____________________________________________  RADIOLOGY  CT abdomen and pelvis: No CT evidence for acute intra-abdominal or pelvic process, status post cholecystectomy, moderate aortobiiliac atherosclerotic disease, no aneurysm, hepatic steatosis. ____________________________________________   PROCEDURES  Procedure(s) performed: None  Critical Care performed:  No  ____________________________________________   INITIAL IMPRESSION / ASSESSMENT AND PLAN / ED COURSE  Pertinent labs & imaging results that were available during my care of the patient were reviewed by me and considered in my medical decision making (see chart for details).  This is a 54 year old male who comes in today with some abdominal pain vomiting and diarrhea. The patient reports that his pain is a 9 out of 10 in intensity. The patient does have some hyponatremia which is concerning for dehydration. The CT is unremarkable at this time. I will give the patient a liter of normal saline and admit him to the hospital duration given his vomiting and diarrhea as well as his abdominal pain. His lactic acid and troponin are unremarkable. I contacted the hospitalist who will admit the patient to the service. ____________________________________________   FINAL CLINICAL IMPRESSION(S) / ED DIAGNOSES  Final diagnoses:  Periumbilical abdominal pain  Hyponatremia      Loney Hering, MD 02/07/15 [phone]

## 2015-02-07 NOTE — Discharge Summary (Signed)
[facility] [place] NAME: Christopher Mason    MR#:  [id number]  DATE OF BIRTH:  09/07/1960  DATE OF ADMISSION:  02/06/2015 ADMITTING PHYSICIAN: Lance Coon, MD  DATE OF DISCHARGE: 02/07/2015  PRIMARY CARE PHYSICIAN: No PCP Per Patient    ADMISSION DIAGNOSIS:  Hyponatremia [S06.3] Periumbilical abdominal pain [R10.33]  DISCHARGE DIAGNOSIS:  Principal Problem:   Hyponatremia Active Problems:   HTN (hypertension)   Hypothyroidism   GERD (gastroesophageal reflux disease)   HLD (hyperlipidemia)   COPD (chronic obstructive pulmonary disease) (H[facility])   Gastritis   SECONDARY DIAGNOSIS:   Past Medical History  Diagnosis Date  . Depression   . Hypertension   . Hypothyroid   . COPD (chronic obstructive pulmonary disease) ([place])   . Irritable bowel syndrome (IBS)   . GERD (gastroesophageal reflux disease)   . HLD (hyperlipidemia)     HOSPITAL COURSE:  This is a 54 year old male who presented with diarrhea and found to have hyponatremia. For further details please refer the H&P.  1. Hyponatremia: Patient was a symptomatic from his sodium level of 127. His hyponatremia was secondary to GI blood loss from his diarrhea. Patient was started on IV fluids and his sodium has much improved since admission. He was back to baseline.  2. Diarrhea: This is viral in etiology and had resolved prior to admission.  3. Tobacco dependence: Patient was highly encouraged to stop smoking. Patient says he was in smoking since age 75 and was not interested in quitting smoking. He will continue with nicotine patch although he says this is not helping. Patient is cost for 3 minutes.  4. Essential hypertension: Continue Norvasc   5. Hypothyroidism: Continue Synthroid  6. Hyperlipidemia: Continue pravastatin DISCHARGE CONDITIONS AND DIET:  Patient is being discharged home in stable condition on a heart healthy diet  CONSULTS OBTAINED:     DRUG  ALLERGIES:   Allergies  Allergen Reactions  . Aspirin Nausea And Vomiting and Swelling    DISCHARGE MEDICATIONS:   Current Discharge Medication List    CONTINUE these medications which have NOT CHANGED   Details  albuterol (PROVENTIL HFA;VENTOLIN HFA) 108 (90 BASE) MCG/ACT inhaler Inhale 2 puffs into the lungs every 4 (four) hours as needed for wheezing or shortness of breath. Qty: 1 each, Refills: 0    amitriptyline (ELAVIL) 50 MG tablet Take 1 tablet (50 mg total) by mouth at bedtime. Qty: 30 tablet, Refills: 0    amLODipine (NORVASC) 10 MG tablet Take 1 tablet (10 mg total) by mouth daily. Qty: 30 tablet, Refills: 0    docusate sodium (COLACE) 100 MG capsule Take 1 capsule (100 mg total) by mouth daily. Qty: 30 capsule, Refills: 0    levothyroxine (SYNTHROID, LEVOTHROID) 75 MCG tablet Take 1 tablet (75 mcg total) by mouth daily. Qty: 30 tablet, Refills: 0    omeprazole (PRILOSEC) 20 MG capsule Take 1 capsule (20 mg total) by mouth daily. Qty: 30 capsule, Refills: 0    pravastatin (PRAVACHOL) 10 MG tablet Take 1 tablet (10 mg total) by mouth at bedtime. Qty: 30 tablet, Refills: 0    !! QUEtiapine (SEROQUEL) 25 MG tablet Take 1 tablet (25 mg total) by mouth 3 (three) times daily before meals. Qty: 90 tablet, Refills: 0    !! QUEtiapine (SEROQUEL) 300 MG tablet Take 1 tablet (300 mg total) by mouth at bedtime. Qty: 30 tablet, Refills: 0    venlafaxine (EFFEXOR) 50 MG tablet Take 1 tablet (50  mg total) by mouth 2 (two) times daily with a meal. With breakfats and lunch. Qty: 60 tablet, Refills: 0    nicotine (NICODERM CQ - DOSED IN MG/24 HOURS) 14 mg/24hr patch Place 1 patch (14 mg total) onto the skin daily. Qty: 28 patch, Refills: 0    traMADol (ULTRAM) 50 MG tablet Take 1 tablet (50 mg total) by mouth 2 (two) times daily. Qty: 10 tablet, Refills: 0     !! - Potential duplicate medications found. Please discuss with provider.            Today   CHIEF  COMPLAINT:  Patient voices no complaints. Patient denies diarrhea or constipation. Patient denies abdominal pain or shortness of breath.   VITAL SIGNS:  Blood pressure 153/96, pulse 57, temperature 97.8 F (36.6 C), temperature source Oral, resp. rate 18, height 5\' 7"  (1.702 m), weight 70.308 kg (155 lb), SpO2 98 %.   REVIEW OF SYSTEMS:  Review of Systems  Constitutional: Negative for fever, chills and malaise/fatigue.  HENT: Negative for sore throat.   Eyes: Negative for blurred vision.  Respiratory: Negative for cough, hemoptysis, shortness of breath and wheezing.   Cardiovascular: Negative for chest pain, palpitations and leg swelling.  Gastrointestinal: Negative for nausea, vomiting, abdominal pain, diarrhea and blood in stool.  Genitourinary: Negative for dysuria.  Musculoskeletal: Negative for back pain.  Neurological: Negative for dizziness, tremors and headaches.  Endo/Heme/Allergies: Does not bruise/bleed easily.     PHYSICAL EXAMINATION:  GENERAL:  54 y.o.-year-old patient lying in the bed with no acute distress.  NECK:  Supple, no jugular venous distention. No thyroid enlargement, no tenderness.  LUNGS: Normal breath sounds bilaterally, no wheezing, rales,rhonchi  No use of accessory muscles of respiration.  CARDIOVASCULAR: S1, S2 normal. No murmurs, rubs, or gallops.  ABDOMEN: Soft, non-tender, non-distended. Bowel sounds present. No organomegaly or mass.  EXTREMITIES: No pedal edema, cyanosis, or clubbing.  PSYCHIATRIC: The patient is alert and oriented x 3.  SKIN: No obvious rash, lesion, or ulcer.   DATA REVIEW:   CBC  Recent Labs Lab 02/06/15 1933  WBC 9.7  HGB 14.4  HCT 41.4  PLT 198    Chemistries   Recent Labs Lab 02/06/15 1933 02/07/15 0755  NA 127* 138  K 3.2* 3.6  CL 96* 107  CO2 23 25  GLUCOSE 92 88  BUN 15 15  CREATININE 1.13 0.92  CALCIUM 8.1* 8.6*  AST 325*  --   ALT 167*  --   ALKPHOS 77  --   BILITOT 0.2*  --     Cardiac  Enzymes  Recent Labs Lab 02/07/15  TROPONINI <0.03    Microbiology Results  @[email or web address] @  RADIOLOGY:  Ct Abdomen Pelvis W Contrast  02/07/2015  CLINICAL DATA:  Initial evaluation for acute abdominal pain with diarrhea since yesterday. EXAM: CT ABDOMEN AND PELVIS WITH CONTRAST TECHNIQUE: Multidetector CT imaging of the abdomen and pelvis was performed using the standard protocol following bolus administration of intravenous contrast. CONTRAST:  132mL OMNIPAQUE IOHEXOL 300 MG/ML  SOLN COMPARISON:  Prior ultrasound from 11/07/2014. FINDINGS: Mild subsegmental atelectasis seen dependently within the visualized lung bases. Mild scattered emphysematous changes noted. Visualized lungs are otherwise unremarkable. Mild diffuse hypoattenuation liver is consistent with steatosis. Liver otherwise unremarkable. Gallbladder is absent. No biliary dilatation. Spleen, adrenal glands, and pancreas demonstrate a normal contrast enhanced appearance. Kidneys are equal in size with symmetric enhancement. No not follow thighs cyst, hydronephrosis, or focal enhancing renal mass. Few tiny subcentimeter hypodense lesions  within the left kidney are too small the characterize by CT, but statistically likely reflects small cyst. Stomach is moderately distended with enteric contrast material within the gastric lumen. No gastric fold thickening to suggest gastritis. Small bowel of normal caliber without evidence of obstruction or acute inflammatory changes. Appendix well visualized in the right lower quadrant and is of normal caliber and appearance without associated inflammatory changes to suggest acute appendicitis. Colon is largely decompressed without acute inflammatory changes. Sequela prior hernia repair present within the right lower quadrant. Bladder within normal limits.  Prostate normal. No free air or fluid. No adenopathy. Moderate aorto bi-iliac atherosclerotic disease. No aneurysm. No acute osseous abnormality. No  worrisome lytic or blastic osseous lesions. Chronic bilateral pars defects present at L5 without associated listhesis. IMPRESSION: 1. No CT evidence for acute intra-abdominal or pelvic process. 2. Status post cholecystectomy. 3. Moderate aorto bi-iliac atherosclerotic disease.  No aneurysm. 4. Hepatic steatosis. 5. 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 Electronically Signed   By: Jeannine Boga M.D.   On: 02/07/2015 01:02      Management plans discussed with the patient and he is in agreement. Stable for discharge home  Patient should follow up with a PCP in one week  CODE STATUS:     Code Status Orders        Start     Ordered   02/07/15 0622  Full code   Continuous     02/07/15 0621      TOTAL TIME TAKING CARE OF THIS PATIENT: 35 minutes.    Note: This dictation was prepared with Dragon dictation along with smaller phrase technology. Any transcriptional errors that result from this process are unintentional.  Alida Greiner M.D on 02/07/2015 at 11:30 AM  Between 7am to 6pm - Pager - [phone] After 6pm go to [email or web address] - password [email or web address] [facility]  [facility]  Office  [phone]  CC: Primary care physician; No PCP Per Patient

## 2015-02-07 NOTE — H&P (Addendum)
[facility] NAME: Christopher Mason    MR#:  [id number]  DATE OF BIRTH:  1961-04-11  DATE OF ADMISSION:  02/06/2015  PRIMARY CARE PHYSICIAN: No PCP Per Patient   REQUESTING/REFERRING PHYSICIAN: Dahlia Client, M.D.  CHIEF COMPLAINT:   Chief Complaint  Patient presents with  . Abdominal Pain    Pt c/o abd pain since approx 330 pm today. Denies vomiting. +nausea and diarrhea. +dysuria.     HISTORY OF PRESENT ILLNESS:  Christopher Mason  is a 54 y.o. male who presents with abdominal pain, nausea and diarrhea. Patient came in the ED for the same symptoms of one day duration. There was noted to have hyponatremia. Some concern for dehydration. Hospitalists were called for admission for hyponatremia. Patient states that his symptoms began after eating some soup. He denies fever or chills or other signs of infection. He did complain initially of some dysuria, though his UA is completely negative. He also states that his abdominal pain and diarrhea have subsided some since being here in the ED.  PAST MEDICAL HISTORY:   Past Medical History  Diagnosis Date  . Depression   . Hypertension   . Hypothyroid   . COPD (chronic obstructive pulmonary disease) ([place])   . Irritable bowel syndrome (IBS)   . GERD (gastroesophageal reflux disease)   . HLD (hyperlipidemia)     PAST SURGICAL HISTORY:   Past Surgical History  Procedure Laterality Date  . Back surgery    . Rotator cuff repair      2007  . Carpal tunnel release Bilateral     SOCIAL HISTORY:   Social History  Substance Use Topics  . Smoking status: Current Every Day Smoker -- 1.00 packs/day    Types: Cigarettes  . Smokeless tobacco: Never Used  . Alcohol Use: No    FAMILY HISTORY:   Family History  Problem Relation Age of Onset  . Dementia Mother   . Hypertension Father   . Cataracts Father   . Diabetes Sister   . Diabetes Brother     DRUG ALLERGIES:   Allergies  Allergen  Reactions  . Aspirin Nausea And Vomiting and Swelling    MEDICATIONS AT HOME:   Prior to Admission medications   Medication Sig Start Date End Date Taking? Authorizing Provider  albuterol (PROVENTIL HFA;VENTOLIN HFA) 108 (90 BASE) MCG/ACT inhaler Inhale 2 puffs into the lungs every 4 (four) hours as needed for wheezing or shortness of breath. 09/25/14  Yes Jolanta B Pucilowska, MD  amitriptyline (ELAVIL) 50 MG tablet Take 1 tablet (50 mg total) by mouth at bedtime. 09/29/14  Yes Jolanta B Pucilowska, MD  amLODipine (NORVASC) 10 MG tablet Take 1 tablet (10 mg total) by mouth daily. 11/21/14  Yes Demetrios Loll, MD  docusate sodium (COLACE) 100 MG capsule Take 1 capsule (100 mg total) by mouth daily. 09/25/14  Yes Jolanta B Pucilowska, MD  levothyroxine (SYNTHROID, LEVOTHROID) 75 MCG tablet Take 1 tablet (75 mcg total) by mouth daily. 09/29/14  Yes Jolanta B Pucilowska, MD  omeprazole (PRILOSEC) 20 MG capsule Take 1 capsule (20 mg total) by mouth daily. 09/29/14  Yes Jolanta B Pucilowska, MD  pravastatin (PRAVACHOL) 10 MG tablet Take 1 tablet (10 mg total) by mouth at bedtime. 09/29/14  Yes Jolanta B Pucilowska, MD  QUEtiapine (SEROQUEL) 25 MG tablet Take 1 tablet (25 mg total) by mouth 3 (three) times daily before meals. 09/25/14  Yes Clovis Fredrickson, MD  QUEtiapine (SEROQUEL)  300 MG tablet Take 1 tablet (300 mg total) by mouth at bedtime. 09/29/14  Yes Jolanta B Pucilowska, MD  venlafaxine (EFFEXOR) 50 MG tablet Take 1 tablet (50 mg total) by mouth 2 (two) times daily with a meal. With breakfats and lunch. Patient taking differently: Take 50 mg by mouth 2 (two) times daily with a meal.  09/29/14  Yes Jolanta B Pucilowska, MD  nicotine (NICODERM CQ - DOSED IN MG/24 HOURS) 14 mg/24hr patch Place 1 patch (14 mg total) onto the skin daily. Patient not taking: Reported on 11/20/2014 11/08/14   Demetrios Loll, MD  traMADol (ULTRAM) 50 MG tablet Take 1 tablet (50 mg total) by mouth 2 (two) times daily. 01/17/15   Dannielle Karvonen  Menshew, PA-C    REVIEW OF SYSTEMS:  Review of Systems  Constitutional: Negative for fever, chills, weight loss and malaise/fatigue.  HENT: Negative for ear pain, hearing loss and tinnitus.   Eyes: Negative for blurred vision, double vision, pain and redness.  Respiratory: Negative for cough, hemoptysis and shortness of breath.   Cardiovascular: Negative for chest pain, palpitations, orthopnea and leg swelling.  Gastrointestinal: Positive for nausea, abdominal pain and diarrhea. Negative for vomiting and constipation.  Genitourinary: Positive for dysuria. Negative for frequency and hematuria.  Musculoskeletal: Negative for back pain, joint pain and neck pain.  Skin:       No acne, rash, or lesions  Neurological: Negative for dizziness, tremors, focal weakness and weakness.  Endo/Heme/Allergies: Negative for polydipsia. Does not bruise/bleed easily.  Psychiatric/Behavioral: Negative for depression. The patient is not nervous/anxious and does not have insomnia.      VITAL SIGNS:   Filed Vitals:   02/06/15 2315 02/06/15 2330 02/07/15 0130 02/07/15 0145  BP:  143/102 147/93   Pulse: 67 56 42 57  Temp:      TempSrc:      Resp:      Height:      Weight:      SpO2: 94% 96% 93% 97%   Wt Readings from Last 3 Encounters:  02/06/15 70.308 kg (155 lb)  01/17/15 72.576 kg (160 lb)  11/21/14 78.064 kg (172 lb 1.6 oz)    PHYSICAL EXAMINATION:  Physical Exam  Vitals reviewed. Constitutional: He is oriented to person, place, and time. He appears well-developed and well-nourished. No distress.  HENT:  Head: Normocephalic and atraumatic.  Mouth/Throat: Oropharynx is clear and moist.  Eyes: Conjunctivae and EOM are normal. Pupils are equal, round, and reactive to light. No scleral icterus.  Neck: Normal range of motion. Neck supple. No JVD present. No thyromegaly present.  Cardiovascular: Normal rate, regular rhythm and intact distal pulses.  Exam reveals no gallop and no friction rub.    No murmur heard. Respiratory: Effort normal and breath sounds normal. No respiratory distress. He has no wheezes. He has no rales.  GI: Soft. Bowel sounds are normal. He exhibits no distension. There is tenderness (diffuse).  Musculoskeletal: Normal range of motion. He exhibits no edema.  No arthritis, no gout  Lymphadenopathy:    He has no cervical adenopathy.  Neurological: He is alert and oriented to person, place, and time. No cranial nerve deficit.  No dysarthria, no aphasia  Skin: Skin is warm and dry. No rash noted. No erythema.  Psychiatric: He has a normal mood and affect. His behavior is normal. Judgment and thought content normal.    LABORATORY PANEL:   CBC  Recent Labs Lab 02/06/15 1933  WBC 9.7  HGB 14.4  HCT 41.4  PLT 198   ------------------------------------------------------------------------------------------------------------------  Chemistries   Recent Labs Lab 02/06/15 1933  NA 127*  K 3.2*  CL 96*  CO2 23  GLUCOSE 92  BUN 15  CREATININE 1.13  CALCIUM 8.1*  AST 325*  ALT 167*  ALKPHOS 77  BILITOT 0.2*   ------------------------------------------------------------------------------------------------------------------  Cardiac Enzymes  Recent Labs Lab 02/07/15  TROPONINI <0.03   ------------------------------------------------------------------------------------------------------------------  RADIOLOGY:  Ct Abdomen Pelvis W Contrast  02/07/2015  CLINICAL DATA:  Initial evaluation for acute abdominal pain with diarrhea since yesterday. EXAM: CT ABDOMEN AND PELVIS WITH CONTRAST TECHNIQUE: Multidetector CT imaging of the abdomen and pelvis was performed using the standard protocol following bolus administration of intravenous contrast. CONTRAST:  159mL OMNIPAQUE IOHEXOL 300 MG/ML  SOLN COMPARISON:  Prior ultrasound from 11/07/2014. FINDINGS: Mild subsegmental atelectasis seen dependently within the visualized lung bases. Mild scattered  emphysematous changes noted. Visualized lungs are otherwise unremarkable. Mild diffuse hypoattenuation liver is consistent with steatosis. Liver otherwise unremarkable. Gallbladder is absent. No biliary dilatation. Spleen, adrenal glands, and pancreas demonstrate a normal contrast enhanced appearance. Kidneys are equal in size with symmetric enhancement. No not follow thighs cyst, hydronephrosis, or focal enhancing renal mass. Few tiny subcentimeter hypodense lesions within the left kidney are too small the characterize by CT, but statistically likely reflects small cyst. Stomach is moderately distended with enteric contrast material within the gastric lumen. No gastric fold thickening to suggest gastritis. Small bowel of normal caliber without evidence of obstruction or acute inflammatory changes. Appendix well visualized in the right lower quadrant and is of normal caliber and appearance without associated inflammatory changes to suggest acute appendicitis. Colon is largely decompressed without acute inflammatory changes. Sequela prior hernia repair present within the right lower quadrant. Bladder within normal limits.  Prostate normal. No free air or fluid. No adenopathy. Moderate aorto bi-iliac atherosclerotic disease. No aneurysm. No acute osseous abnormality. No worrisome lytic or blastic osseous lesions. Chronic bilateral pars defects present at L5 without associated listhesis. IMPRESSION: 1. No CT evidence for acute intra-abdominal or pelvic process. 2. Status post cholecystectomy. 3. Moderate aorto bi-iliac atherosclerotic disease.  No aneurysm. 4. Hepatic steatosis. 5. 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 Electronically Signed   By: Jeannine Boga M.D.   On: 02/07/2015 01:02    EKG:   Orders placed or performed during the hospital encounter of 02/06/15  . EKG 12-Lead  . EKG 12-Lead    IMPRESSION AND PLAN:  Principal Problem:   Hyponatremia - suspect due to  his diarrhea. We'll hydrate and recheck his value morning. Active Problems:   Gastritis - unclear etiology, but suspect less likely any sort of bacterial infection given his benign workup. We'll start food as tolerated with clear liquids. Antiemetics when necessary on board.   HTN (hypertension) - stable, continue home meds   GERD (gastroesophageal reflux disease) - equivalent home dose PPI   COPD (chronic obstructive pulmonary disease) ([place]) - continue when necessary inhaler at home dose   Hypothyroidism - home dose thyroid replacement   HLD (hyperlipidemia) - home dose anti-lipids  All the records are reviewed and case discussed with ED provider. Management plans discussed with the patient and/or family.  DVT PROPHYLAXIS: SubQ lovenox  ADMISSION STATUS: Observation  CODE STATUS: Full  TOTAL TIME TAKING CARE OF THIS PATIENT: 40 minutes.    Kaidon Kinker [place] 02/07/2015, 3:56 AM  [facility]  Office  [phone]  CC: Primary care physician; No PCP Per Patient

## 2015-05-21 ENCOUNTER — Ambulatory Visit
Admission: RE | Admit: 2015-05-21 | Discharge: 2015-05-21 | Disposition: A | Discharge status: Home / Self Care | Payer: Medicaid Other | Source: Ambulatory Visit | Attending: Physician Assistant | Admitting: Physician Assistant

## 2015-05-21 ENCOUNTER — Other Ambulatory Visit: Payer: Self-pay | Admitting: Physician Assistant

## 2015-05-21 DIAGNOSIS — M25572 Pain in left ankle and joints of left foot: Secondary | ICD-10-CM | POA: Diagnosis not present

## 2015-05-21 DIAGNOSIS — M79672 Pain in left foot: Secondary | ICD-10-CM

## 2015-05-21 DIAGNOSIS — M19072 Primary osteoarthritis, left ankle and foot: Secondary | ICD-10-CM | POA: Insufficient documentation

## 2015-05-21 DIAGNOSIS — Z8612 Personal history of poliomyelitis: Secondary | ICD-10-CM | POA: Diagnosis not present

## 2015-09-04 ENCOUNTER — Inpatient Hospital Stay
Admission: EM | Admit: 2015-09-04 | Discharge: 2015-09-06 | DRG: 379 | Disposition: A | Discharge status: Home / Self Care | Payer: Medicaid Other | Attending: Internal Medicine | Admitting: Internal Medicine

## 2015-09-04 ENCOUNTER — Encounter: Payer: Self-pay | Admitting: Emergency Medicine

## 2015-09-04 DIAGNOSIS — Z886 Allergy status to analgesic agent status: Secondary | ICD-10-CM

## 2015-09-04 DIAGNOSIS — J45909 Unspecified asthma, uncomplicated: Secondary | ICD-10-CM | POA: Diagnosis present

## 2015-09-04 DIAGNOSIS — F1290 Cannabis use, unspecified, uncomplicated: Secondary | ICD-10-CM | POA: Diagnosis present

## 2015-09-04 DIAGNOSIS — Z79899 Other long term (current) drug therapy: Secondary | ICD-10-CM

## 2015-09-04 DIAGNOSIS — E039 Hypothyroidism, unspecified: Secondary | ICD-10-CM | POA: Diagnosis present

## 2015-09-04 DIAGNOSIS — K580 Irritable bowel syndrome with diarrhea: Secondary | ICD-10-CM | POA: Diagnosis present

## 2015-09-04 DIAGNOSIS — F17210 Nicotine dependence, cigarettes, uncomplicated: Secondary | ICD-10-CM | POA: Diagnosis present

## 2015-09-04 DIAGNOSIS — E860 Dehydration: Secondary | ICD-10-CM | POA: Diagnosis present

## 2015-09-04 DIAGNOSIS — Z833 Family history of diabetes mellitus: Secondary | ICD-10-CM

## 2015-09-04 DIAGNOSIS — K922 Gastrointestinal hemorrhage, unspecified: Secondary | ICD-10-CM

## 2015-09-04 DIAGNOSIS — E876 Hypokalemia: Secondary | ICD-10-CM | POA: Diagnosis present

## 2015-09-04 DIAGNOSIS — K920 Hematemesis: Principal | ICD-10-CM | POA: Diagnosis present

## 2015-09-04 DIAGNOSIS — I10 Essential (primary) hypertension: Secondary | ICD-10-CM | POA: Diagnosis present

## 2015-09-04 DIAGNOSIS — Z9889 Other specified postprocedural states: Secondary | ICD-10-CM

## 2015-09-04 DIAGNOSIS — Z8249 Family history of ischemic heart disease and other diseases of the circulatory system: Secondary | ICD-10-CM

## 2015-09-04 DIAGNOSIS — J449 Chronic obstructive pulmonary disease, unspecified: Secondary | ICD-10-CM | POA: Diagnosis present

## 2015-09-04 DIAGNOSIS — F329 Major depressive disorder, single episode, unspecified: Secondary | ICD-10-CM | POA: Diagnosis present

## 2015-09-04 DIAGNOSIS — K210 Gastro-esophageal reflux disease with esophagitis: Secondary | ICD-10-CM | POA: Diagnosis present

## 2015-09-04 HISTORY — DX: Unspecified asthma, uncomplicated: J45.909

## 2015-09-04 LAB — CBC
HCT: 42.8 % (ref 40.0–52.0)
Hemoglobin: 14.4 g/dL (ref 13.0–18.0)
MCH: 30.8 pg (ref 26.0–34.0)
MCHC: 33.6 g/dL (ref 32.0–36.0)
MCV: 91.4 fL (ref 80.0–100.0)
PLATELETS: 220 10*3/uL (ref 150–440)
RBC: 4.68 MIL/uL (ref 4.40–5.90)
RDW: 14.2 % (ref 11.5–14.5)
WBC: 10.4 10*3/uL (ref 3.8–10.6)

## 2015-09-04 LAB — COMPREHENSIVE METABOLIC PANEL
ALT: 18 U/L (ref 17–63)
AST: 22 U/L (ref 15–41)
Albumin: 4.6 g/dL (ref 3.5–5.0)
Alkaline Phosphatase: 95 U/L (ref 38–126)
Anion gap: 9 (ref 5–15)
BUN: 20 mg/dL (ref 6–20)
CHLORIDE: 101 mmol/L (ref 101–111)
CO2: 28 mmol/L (ref 22–32)
CREATININE: 1.06 mg/dL (ref 0.61–1.24)
Calcium: 9.9 mg/dL (ref 8.9–10.3)
GFR calc Af Amer: >60 mL/min (ref >60)
GFR calc non Af Amer: >60 mL/min (ref >60)
Glucose, Bld: 122 mg/dL — ABNORMAL HIGH (ref 65–99)
Potassium: 3.4 mmol/L — ABNORMAL LOW (ref 3.5–5.1)
SODIUM: 138 mmol/L (ref 135–145)
Total Bilirubin: 0.5 mg/dL (ref 0.3–1.2)
Total Protein: 8.5 g/dL — ABNORMAL HIGH (ref 6.5–8.1)

## 2015-09-04 LAB — LIPASE, BLOOD: LIPASE: 19 U/L (ref 11–51)

## 2015-09-04 LAB — OCCULT BLOOD GASTRIC / DUODENUM (SPECIMEN CUP)
Occult Blood, Gastric: POSITIVE — AB
pH, Gastric: 3

## 2015-09-04 MED ORDER — ONDANSETRON HCL 4 MG/2ML IJ SOLN
4.0000 mg | Freq: Once | INTRAMUSCULAR | Status: AC | PRN
  Administered 2015-09-04: 4 mg via INTRAVENOUS

## 2015-09-04 MED ORDER — ONDANSETRON HCL 4 MG/2ML IJ SOLN
INTRAMUSCULAR | Status: AC
  Administered 2015-09-04: 4 mg via INTRAVENOUS
  Filled 2015-09-04: qty 2

## 2015-09-04 NOTE — ED Notes (Signed)
Pt taken to treatment room 26 via wheelchair by Edison Nasuti, ED tech; phone report given to [place], [place]

## 2015-09-04 NOTE — ED Notes (Signed)
Pt given water, not complaining of abdominal pain or nausea.

## 2015-09-04 NOTE — ED Notes (Addendum)
Pt had another incontinent stool, some stool able to be collected for sample.  Peri care provided and pt changed into clean disposable brief, pt positioned for comfort, NAD at this time.  Pt not complaining of pain at this time.

## 2015-09-04 NOTE — ED Notes (Addendum)
Pt c/o N/V/D since Wednesday morning, intermittently; generalized abd pain; pt says he's had diarrhea "off and on all day", almost like water; pt with history of IBS; dizzy with standing; pt vomited large amount of chocolate brown colored emesis with fecal smell

## 2015-09-04 NOTE — ED Notes (Signed)
Pt reports he had an incontinent BM.  Pt assisted with peri-care.  Pants and underwear removed.  Per pt's request, underwear thrown away.  Attempted to collect stool sample, but stool was liquid and soaked into fabric.  Pt placed in disposable brief and positioned for comfort, lights dimmed.  Pt NAD at this time.

## 2015-09-05 ENCOUNTER — Emergency Department: Payer: Medicaid Other

## 2015-09-05 DIAGNOSIS — F17210 Nicotine dependence, cigarettes, uncomplicated: Secondary | ICD-10-CM | POA: Diagnosis present

## 2015-09-05 DIAGNOSIS — I10 Essential (primary) hypertension: Secondary | ICD-10-CM | POA: Diagnosis present

## 2015-09-05 DIAGNOSIS — K920 Hematemesis: Secondary | ICD-10-CM | POA: Diagnosis present

## 2015-09-05 DIAGNOSIS — Z833 Family history of diabetes mellitus: Secondary | ICD-10-CM | POA: Diagnosis not present

## 2015-09-05 DIAGNOSIS — Z8249 Family history of ischemic heart disease and other diseases of the circulatory system: Secondary | ICD-10-CM | POA: Diagnosis not present

## 2015-09-05 DIAGNOSIS — Z9889 Other specified postprocedural states: Secondary | ICD-10-CM | POA: Diagnosis not present

## 2015-09-05 DIAGNOSIS — J449 Chronic obstructive pulmonary disease, unspecified: Secondary | ICD-10-CM | POA: Diagnosis present

## 2015-09-05 DIAGNOSIS — K580 Irritable bowel syndrome with diarrhea: Secondary | ICD-10-CM | POA: Diagnosis present

## 2015-09-05 DIAGNOSIS — E860 Dehydration: Secondary | ICD-10-CM | POA: Diagnosis present

## 2015-09-05 DIAGNOSIS — K210 Gastro-esophageal reflux disease with esophagitis: Secondary | ICD-10-CM | POA: Diagnosis present

## 2015-09-05 DIAGNOSIS — E876 Hypokalemia: Secondary | ICD-10-CM | POA: Diagnosis present

## 2015-09-05 DIAGNOSIS — Z886 Allergy status to analgesic agent status: Secondary | ICD-10-CM | POA: Diagnosis not present

## 2015-09-05 DIAGNOSIS — K922 Gastrointestinal hemorrhage, unspecified: Secondary | ICD-10-CM

## 2015-09-05 DIAGNOSIS — J45909 Unspecified asthma, uncomplicated: Secondary | ICD-10-CM | POA: Diagnosis present

## 2015-09-05 DIAGNOSIS — Z79899 Other long term (current) drug therapy: Secondary | ICD-10-CM | POA: Diagnosis not present

## 2015-09-05 DIAGNOSIS — F1290 Cannabis use, unspecified, uncomplicated: Secondary | ICD-10-CM | POA: Diagnosis present

## 2015-09-05 DIAGNOSIS — F329 Major depressive disorder, single episode, unspecified: Secondary | ICD-10-CM | POA: Diagnosis present

## 2015-09-05 DIAGNOSIS — E039 Hypothyroidism, unspecified: Secondary | ICD-10-CM | POA: Diagnosis present

## 2015-09-05 HISTORY — DX: Gastrointestinal hemorrhage, unspecified: K92.2

## 2015-09-05 LAB — GASTROINTESTINAL PANEL BY PCR, STOOL (REPLACES STOOL CULTURE)
Adenovirus F40/41: NOT DETECTED
Astrovirus: NOT DETECTED
CAMPYLOBACTER SPECIES: NOT DETECTED
Cryptosporidium: NOT DETECTED
Cyclospora cayetanensis: NOT DETECTED
E. coli O157: NOT DETECTED
ENTEROAGGREGATIVE E COLI (EAEC): NOT DETECTED
ENTEROPATHOGENIC E COLI (EPEC): NOT DETECTED
Entamoeba histolytica: NOT DETECTED
Enterotoxigenic E coli (ETEC): NOT DETECTED
GIARDIA LAMBLIA: NOT DETECTED
NOROVIRUS GI/GII: NOT DETECTED
PLESIMONAS SHIGELLOIDES: NOT DETECTED
Rotavirus A: NOT DETECTED
SALMONELLA SPECIES: NOT DETECTED
SHIGELLA/ENTEROINVASIVE E COLI (EIEC): NOT DETECTED
Sapovirus (I, II, IV, and V): NOT DETECTED
Shiga like toxin producing E coli (STEC): NOT DETECTED
Vibrio cholerae: NOT DETECTED
Vibrio species: NOT DETECTED
Yersinia enterocolitica: NOT DETECTED

## 2015-09-05 LAB — BASIC METABOLIC PANEL
ANION GAP: 8 (ref 5–15)
BUN: 14 mg/dL (ref 6–20)
CALCIUM: 9 mg/dL (ref 8.9–10.3)
CHLORIDE: 105 mmol/L (ref 101–111)
CO2: 25 mmol/L (ref 22–32)
Creatinine, Ser: 0.92 mg/dL (ref 0.61–1.24)
GFR calc Af Amer: >60 mL/min (ref >60)
GFR calc non Af Amer: >60 mL/min (ref >60)
GLUCOSE: 90 mg/dL (ref 65–99)
Potassium: 3.4 mmol/L — ABNORMAL LOW (ref 3.5–5.1)
Sodium: 138 mmol/L (ref 135–145)

## 2015-09-05 LAB — ETHANOL: Alcohol, Ethyl (B): <5 mg/dL (ref <5)

## 2015-09-05 LAB — HEMOGLOBIN AND HEMATOCRIT, BLOOD
HCT: 37.4 % — ABNORMAL LOW (ref 40.0–52.0)
HCT: 37.5 % — ABNORMAL LOW (ref 40.0–52.0)
HEMATOCRIT: 38.6 % — AB (ref 40.0–52.0)
HEMOGLOBIN: 13.3 g/dL (ref 13.0–18.0)
HEMOGLOBIN: 12.7 g/dL — AB (ref 13.0–18.0)
Hemoglobin: 12.7 g/dL — ABNORMAL LOW (ref 13.0–18.0)

## 2015-09-05 LAB — C DIFFICILE QUICK SCREEN W PCR REFLEX
C DIFFICILE (CDIFF) TOXIN: NEGATIVE
C DIFFICLE (CDIFF) ANTIGEN: NEGATIVE
C Diff interpretation: NEGATIVE

## 2015-09-05 LAB — PROTIME-INR
INR: 0.91
PROTHROMBIN TIME: 12.5 s (ref 11.4–15.0)

## 2015-09-05 LAB — APTT: aPTT: 34 seconds (ref 24–36)

## 2015-09-05 MED ORDER — DIATRIZOATE MEGLUMINE & SODIUM 66-10 % PO SOLN
15.0000 mL | ORAL | Status: DC
Start: 2015-09-05 — End: 2015-09-05
  Administered 2015-09-05: 15 mL via ORAL

## 2015-09-05 MED ORDER — MORPHINE SULFATE (PF) 2 MG/ML IV SOLN
2.0000 mg | INTRAVENOUS | Status: DC | PRN
  Administered 2015-09-05: 2 mg via INTRAVENOUS
  Filled 2015-09-05: qty 1

## 2015-09-05 MED ORDER — ONDANSETRON HCL 4 MG PO TABS: 4.0000 mg | ORAL_TABLET | Freq: Four times a day (QID) | ORAL | Status: DC | PRN

## 2015-09-05 MED ORDER — ONDANSETRON HCL 4 MG/2ML IJ SOLN: 4.0000 mg | Freq: Four times a day (QID) | INTRAMUSCULAR | Status: DC | PRN

## 2015-09-05 MED ORDER — VENLAFAXINE HCL 25 MG PO TABS
50.0000 mg | ORAL_TABLET | Freq: Two times a day (BID) | ORAL | Status: DC
  Administered 2015-09-05 – 2015-09-06 (×3): 50 mg via ORAL
  Filled 2015-09-05 (×3): qty 2

## 2015-09-05 MED ORDER — SODIUM CHLORIDE 0.9 % IV BOLUS (SEPSIS)
1000.0000 mL | Freq: Once | INTRAVENOUS | Status: AC
  Administered 2015-09-05: 1000 mL via INTRAVENOUS

## 2015-09-05 MED ORDER — ONDANSETRON HCL 4 MG/2ML IJ SOLN
INTRAMUSCULAR | Status: AC
  Administered 2015-09-05: 4 mg via INTRAVENOUS
  Filled 2015-09-05: qty 2

## 2015-09-05 MED ORDER — PANTOPRAZOLE SODIUM 40 MG IV SOLR
40.0000 mg | Freq: Once | INTRAVENOUS | Status: AC
  Administered 2015-09-05: 40 mg via INTRAVENOUS
  Filled 2015-09-05: qty 40

## 2015-09-05 MED ORDER — PANTOPRAZOLE SODIUM 40 MG IV SOLR
8.0000 mg/h | INTRAVENOUS | Status: DC
  Administered 2015-09-05 – 2015-09-06 (×3): 8 mg/h via INTRAVENOUS
  Filled 2015-09-05 (×3): qty 80

## 2015-09-05 MED ORDER — ONDANSETRON HCL 4 MG/2ML IJ SOLN
4.0000 mg | Freq: Once | INTRAMUSCULAR | Status: AC
  Administered 2015-09-05: 4 mg via INTRAVENOUS

## 2015-09-05 MED ORDER — ACETAMINOPHEN 325 MG PO TABS: 650.0000 mg | ORAL_TABLET | Freq: Four times a day (QID) | ORAL | Status: DC | PRN

## 2015-09-05 MED ORDER — IOPAMIDOL (ISOVUE-300) INJECTION 61%
100.0000 mL | Freq: Once | INTRAVENOUS | Status: AC | PRN
  Administered 2015-09-05: 100 mL via INTRAVENOUS

## 2015-09-05 MED ORDER — QUETIAPINE FUMARATE 100 MG PO TABS
300.0000 mg | ORAL_TABLET | Freq: Every day | ORAL | Status: DC
  Administered 2015-09-05: 300 mg via ORAL
  Filled 2015-09-05: qty 3

## 2015-09-05 MED ORDER — ALBUTEROL SULFATE (2.5 MG/3ML) 0.083% IN NEBU: 3.0000 mL | INHALATION_SOLUTION | RESPIRATORY_TRACT | Status: DC | PRN

## 2015-09-05 MED ORDER — POTASSIUM CHLORIDE IN NACL 20-0.9 MEQ/L-% IV SOLN
INTRAVENOUS | Status: DC
  Administered 2015-09-05 (×2): via INTRAVENOUS
  Filled 2015-09-05 (×4): qty 1000

## 2015-09-05 MED ORDER — ACETAMINOPHEN 650 MG RE SUPP: 650.0000 mg | Freq: Four times a day (QID) | RECTAL | Status: DC | PRN

## 2015-09-05 NOTE — ED Provider Notes (Signed)
[facility] Emergency Department Provider Note  ____________________________________________  Time seen: 11:50 PM  I have reviewed the triage vital signs and the nursing notes.   HISTORY  Chief Complaint Emesis; Diarrhea; and Abdominal Pain    HPI Christopher Mason is a 55 y.o. male comes the ED for vomiting black emesis for the past 4 days. He said he reports that it looks like black beans.  Denies dizziness or syncope. He does have generalized abdominal pain with nausea and is also having watery diarrhea over the past 4 days. Denies any history of liver disease. No chest pain shortness of breath or fever. He does feel very cold.     Past Medical History  Diagnosis Date  . Depression   . Hypertension   . Hypothyroid   . COPD (chronic obstructive pulmonary disease) ([place])   . Irritable bowel syndrome (IBS)   . GERD (gastroesophageal reflux disease)   . HLD (hyperlipidemia)   . Asthma      Patient Active Problem List   Diagnosis Date Noted  . HLD (hyperlipidemia) 02/07/2015  . COPD (chronic obstructive pulmonary disease) ([place]) 02/07/2015  . Hyponatremia 02/07/2015  . Gastritis 02/07/2015  . Acute kidney injury ([place]) 11/20/2014  . ARF (acute renal failure) ([place]) 11/06/2014  . Hypokalemia 11/06/2014  . Hypotension 11/06/2014  . Cannabis use disorder, severe, dependence ([place]) 09/24/2014  . Undifferentiated schizophrenia ([place])   . Hypothyroidism 09/17/2014  . GERD (gastroesophageal reflux disease) 09/17/2014  . Tobacco use disorder 09/17/2014  . Non compliance w medication regimen 09/16/2014  . Asthma 09/16/2014  . HTN (hypertension) 09/16/2014  . Tardive dyskinesia 09/16/2014     Past Surgical History  Procedure Laterality Date  . Back surgery    . Rotator cuff repair      2007  . Carpal tunnel release Bilateral      Current Outpatient Rx  Name  Route  Sig  Dispense  Refill  . albuterol (PROVENTIL HFA;VENTOLIN HFA) 108 (90 BASE) MCG/ACT  inhaler   Inhalation   Inhale 2 puffs into the lungs every 4 (four) hours as needed for wheezing or shortness of breath.   1 each   0   . hydrOXYzine (ATARAX/VISTARIL) 50 MG tablet   Oral   Take 50 mg by mouth 3 (three) times daily as needed.         Marland Kitchen QUEtiapine (SEROQUEL) 300 MG tablet   Oral   Take 1 tablet (300 mg total) by mouth at bedtime.   30 tablet   0   . terbinafine (LAMISIL) 250 MG tablet   Oral   Take 250 mg by mouth daily.         Marland Kitchen venlafaxine (EFFEXOR) 50 MG tablet   Oral   Take 50 mg by mouth 2 (two) times daily.         Marland Kitchen zolpidem (AMBIEN) 5 MG tablet   Oral   Take 5 mg by mouth at bedtime as needed for sleep.            Allergies Aspirin   Family History  Problem Relation Age of Onset  . Dementia Mother   . Hypertension Father   . Cataracts Father   . Diabetes Sister   . Diabetes Brother     Social History Social History  Substance Use Topics  . Smoking status: Current Every Day Smoker -- 1.00 packs/day    Types: Cigarettes  . Smokeless tobacco: Never Used  . Alcohol Use: No  Review of Systems  Constitutional:  Positive chills.  Eyes:   No vision changes.  ENT:   No sore throat. No rhinorrhea. Cardiovascular:   No chest pain. Respiratory:   No dyspnea or cough. Gastrointestinal:   Positive generalized abdominal pain with vomiting and diarrhea.  Genitourinary:   Negative for dysuria or difficulty urinating. Musculoskeletal:   Negative for focal pain or swelling Neurological:   Negative for headaches 10-point ROS otherwise negative.  ____________________________________________   PHYSICAL EXAM:  VITAL SIGNS: ED Triage Vitals  Enc Vitals Group     BP 09/04/15 2225 100/64 mmHg     Pulse Rate 09/04/15 2225 86     Resp 09/04/15 2225 20     Temp 09/04/15 2225 97.7 F (36.5 C)     Temp Source 09/04/15 2225 Oral     SpO2 09/04/15 2225 96 %     Weight 09/04/15 2225 153 lb (69.4 kg)     Height 09/04/15 2225 5' 7.75"  (1.721 m)     Head Cir --      Peak Flow --      Pain Score 09/04/15 2228 10     Pain Loc --      Pain Edu? --      Excl. in [place]? --     Vital signs reviewed, nursing assessments reviewed.   Constitutional:   Alert and oriented. Ill-appearing. Eyes:   No scleral icterus. No conjunctival pallor. PERRL. EOMI.  No nystagmus. ENT   Head:   Normocephalic and atraumatic.   Nose:   No congestion/rhinnorhea. No septal hematoma   Mouth/Throat:   Dry mucous membranes, no pharyngeal erythema. No peritonsillar mass.    Neck:   No stridor. No SubQ emphysema. No meningismus. Hematological/Lymphatic/Immunilogical:   No cervical lymphadenopathy. Cardiovascular:   RRR. Symmetric bilateral radial and DP pulses.  No murmurs.  Respiratory:   Normal respiratory effort without tachypnea nor retractions. Breath sounds are clear and equal bilaterally. No wheezes/rales/rhonchi. Gastrointestinal:   Soft with diffuse upper abdominal tenderness. Non distended. No tympany. There is no CVA tenderness.  No rebound, rigidity, or guarding. Rectal exam reveals brown stool, Hemoccult negative Genitourinary:   deferred Musculoskeletal:   Nontender with normal range of motion in all extremities. No joint effusions.  No lower extremity tenderness.  No edema. Neurologic:   Normal speech and language.  CN 2-10 normal. Motor grossly intact. No gross focal neurologic deficits are appreciated.  Skin:    Skin is warm, dry and intact. No rash noted.  No petechiae, purpura, or bullae.  ____________________________________________    LABS (pertinent positives/negatives) (all labs ordered are listed, but only abnormal results are displayed) Labs Reviewed  COMPREHENSIVE METABOLIC PANEL - Abnormal; Notable for the following:    Potassium 3.4 (*)    Glucose, Bld 122 (*)    Total Protein 8.5 (*)    All other components within normal limits  OCCULT BLOOD GASTRIC / DUODENUM (SPECIMEN CUP) - Abnormal; Notable for the  following:    Occult Blood, Gastric POSITIVE (*)    All other components within normal limits  GASTROINTESTINAL PANEL BY PCR, STOOL (REPLACES STOOL CULTURE)  C DIFFICILE QUICK SCREEN W PCR REFLEX  LIPASE, BLOOD  CBC  ETHANOL  PROTIME-INR  APTT  POCT GASTRIC OCCULT BLOOD (1-CARD TO LAB)   ____________________________________________   EKG    ____________________________________________    RADIOLOGY  CT abdomen and pelvis unremarkable  ____________________________________________   PROCEDURES   ____________________________________________   INITIAL IMPRESSION / ASSESSMENT AND  PLAN / ED COURSE  Pertinent labs & imaging results that were available during my care of the patient were reviewed by me and considered in my medical decision making (see chart for details).  Patient presents with vomiting of black emesis. Occult blood testing of the vomit is positive. Rest of labs and CT are unremarkable, vital signs remained stable and essentially normal in the [place]. Patient is having significant amount of nausea and abdominal pain. Because of the new finding of acute upper GI bleed, give the patient IV Protonix and discussed the case with the hospitalist for further evaluation.     ____________________________________________   FINAL CLINICAL IMPRESSION(S) / ED DIAGNOSES  Final diagnoses:  Acute upper GI bleed       Portions of this note were generated with dragon dictation software. Dictation errors may occur despite best attempts at proofreading.   Carrie Mew, MD 09/05/15 [phone]

## 2015-09-05 NOTE — Progress Notes (Signed)
Per Dr. Leslye Peer okay to change diet order to NPO except sips with meds

## 2015-09-05 NOTE — Consult Note (Signed)
GI Inpatient Consult Note  Reason for Consult: Coffee ground emesis   Attending Requesting Consult:  History of Present Illness: Christopher Mason is a 55 y.o. male with long hx of GERD and GI upset on various PPI's in the past. Presented with nausea, vomiting, abdominal pain, and then coffee ground emesis x 6 last night. Smokes but denies drinking. Supposedly has allergy to ASA but denies any recent NSAIDS use. Breathing ok now.  Past Medical History:  Past Medical History  Diagnosis Date  . Depression   . Hypertension   . Hypothyroid   . COPD (chronic obstructive pulmonary disease) ([place])   . Irritable bowel syndrome (IBS)   . GERD (gastroesophageal reflux disease)   . HLD (hyperlipidemia)   . Asthma     Problem List: Patient Active Problem List   Diagnosis Date Noted  . GI bleeding 09/05/2015  . HLD (hyperlipidemia) 02/07/2015  . COPD (chronic obstructive pulmonary disease) ([place]) 02/07/2015  . Hyponatremia 02/07/2015  . Gastritis 02/07/2015  . Acute kidney injury ([place]) 11/20/2014  . ARF (acute renal failure) ([place]) 11/06/2014  . Hypokalemia 11/06/2014  . Hypotension 11/06/2014  . Cannabis use disorder, severe, dependence ([place]) 09/24/2014  . Undifferentiated schizophrenia ([place])   . Hypothyroidism 09/17/2014  . GERD (gastroesophageal reflux disease) 09/17/2014  . Tobacco use disorder 09/17/2014  . Non compliance w medication regimen 09/16/2014  . Asthma 09/16/2014  . HTN (hypertension) 09/16/2014  . Tardive dyskinesia 09/16/2014    Past Surgical History: Past Surgical History  Procedure Laterality Date  . Back surgery    . Rotator cuff repair      2007  . Carpal tunnel release Bilateral     Allergies: Allergies  Allergen Reactions  . Aspirin Nausea And Vomiting and Swelling    Home Medications: Prescriptions prior to admission  Medication Sig Dispense Refill Last Dose  . albuterol (PROVENTIL HFA;VENTOLIN HFA) 108 (90 BASE) MCG/ACT inhaler Inhale 2 puffs into the  lungs every 4 (four) hours as needed for wheezing or shortness of breath. 1 each 0 prn at prn  . hydrOXYzine (ATARAX/VISTARIL) 50 MG tablet Take 50 mg by mouth 3 (three) times daily as needed.   09/04/2015 at Unknown time  . QUEtiapine (SEROQUEL) 300 MG tablet Take 1 tablet (300 mg total) by mouth at bedtime. 30 tablet 0 09/04/2015 at Unknown time  . terbinafine (LAMISIL) 250 MG tablet Take 250 mg by mouth daily.   09/04/2015 at Unknown time  . venlafaxine (EFFEXOR) 50 MG tablet Take 50 mg by mouth 2 (two) times daily.   09/04/2015 at Unknown time  . zolpidem (AMBIEN) 5 MG tablet Take 5 mg by mouth at bedtime as needed for sleep.   prn at prn   Home medication reconciliation was completed with the patient.   Scheduled Inpatient Medications:   . QUEtiapine  300 mg Oral QHS  . venlafaxine  50 mg Oral BID    Continuous Inpatient Infusions:   . 0.9 % NaCl with KCl 20 mEq / L 125 mL/hr at 09/05/15 0755  . pantoprozole (PROTONIX) infusion 8 mg/hr (09/05/15 0440)    PRN Inpatient Medications:  acetaminophen **OR** acetaminophen, albuterol, morphine injection, ondansetron **OR** ondansetron (ZOFRAN) IV  Family History: family history includes Cataracts in his father; Dementia in his mother; Diabetes in his brother and sister; Hypertension in his father.  The patient's family history is negative for inflammatory bowel disorders, GI malignancy, or solid organ transplantation.  Social History:   reports that he has been smoking Cigarettes.  He has been smoking about 1.00 pack per day. He has never used smokeless tobacco. He reports that he uses illicit drugs (Marijuana) about once per week. He reports that he does not drink alcohol. The patient denies ETOH, tobacco, or drug use.   Review of Systems: Constitutional: Weight is stable.  Eyes: No changes in vision. ENT: No oral lesions, sore throat.  GI: see HPI.  Heme/Lymph: No easy bruising.  CV: No chest pain.  GU: No hematuria.  Integumentary:  No rashes.  Neuro: No headaches.  Psych: No depression/anxiety.  Endocrine: No heat/cold intolerance.  Allergic/Immunologic: No urticaria.  Resp: No cough, SOB.  Musculoskeletal: No joint swelling.    Physical Examination: BP 130/94 mmHg  Pulse 69  Temp(Src) 98 F (36.7 C) (Oral)  Resp 20  Ht 5' 7.75" (1.721 m)  Wt 153 lb (69.4 kg)  BMI 23.43 kg/m2  SpO2 94% Gen: NAD, alert and oriented x 4 HEENT: PEERLA, EOMI, Neck: supple, no JVD or thyromegaly Chest: CTA bilaterally, no wheezes, crackles, or other adventitious sounds CV: RRR, no m/g/c/r Abd: soft, epigastric tenderness, ND, +BS in all four quadrants; no HSM, guarding, ridigity, or rebound tenderness Ext: no edema, well perfused with 2+ pulses, Skin: no rash or lesions noted Lymph: no LAD  Data: Lab Results  Component Value Date   WBC 10.4 09/04/2015   HGB 13.3 09/05/2015   HCT 38.6* 09/05/2015   MCV 91.4 09/04/2015   PLT 220 09/04/2015    Recent Labs Lab 09/04/15 2240 09/05/15 0523  HGB 14.4 13.3   Lab Results  Component Value Date   NA 138 09/05/2015   K 3.4* 09/05/2015   CL 105 09/05/2015   CO2 25 09/05/2015   BUN 14 09/05/2015   CREATININE 0.92 09/05/2015   Lab Results  Component Value Date   ALT 18 09/04/2015   AST 22 09/04/2015   ALKPHOS 95 09/04/2015   BILITOT 0.5 09/04/2015    Recent Labs Lab 09/04/15 2240  APTT 34  INR 0.91   Assessment/Plan: Mr. Devincentis is a 55 y.o. male with UGI bleeding. Could have reflux esophagitis but could have PUD as well.  Recommendations: Daily PPI.  Daily Hgb. Plan EGD tomorrow afternoon.  Thank you for the consult. Please call with questions or concerns.  Connie Lasater, Lupita Dawn, MD

## 2015-09-05 NOTE — Progress Notes (Signed)
Patient ID: Christopher Mason, male   DOB: 1961/01/17, 55 y.o.   MRN: [id number] [facility] PROGRESS NOTE  Christopher Mason [id number] DOB: Jul 05, 1960 DOA: 09/04/2015 PCP: Christie Nottingham., PA  HPI/Subjective: Patient states that he's been vomiting 3 times at a stretch. Started on Thursday evening. He saw dark blood in the vomit. Patient's also been having diarrhea dark black in nature. Patient also having abdominal pain that comes and goes.  Objective: Filed Vitals:   09/05/15 0430 09/05/15 0517  BP: 154/99 130/94  Pulse: 71 69  Temp:  98 F (36.7 C)  Resp: 20 20    Filed Weights   09/04/15 2225  Weight: 69.4 kg (153 lb)    ROS: Review of Systems  Constitutional: Negative for fever and chills.  Eyes: Negative for blurred vision.  Respiratory: Negative for cough and shortness of breath.   Cardiovascular: Negative for chest pain.  Gastrointestinal: Positive for nausea, vomiting, abdominal pain and melena. Negative for diarrhea and constipation.  Genitourinary: Negative for dysuria.  Musculoskeletal: Negative for joint pain.  Neurological: Negative for dizziness and headaches.   Exam: Physical Exam  Constitutional: He is oriented to person, place, and time.  HENT:  Nose: No mucosal edema.  Mouth/Throat: No oropharyngeal exudate or posterior oropharyngeal edema.  Eyes: Conjunctivae, EOM and lids are normal. Pupils are equal, round, and reactive to light.  Neck: No JVD present. Carotid bruit is not present. No edema present. No thyroid mass and no thyromegaly present.  Cardiovascular: S1 normal and S2 normal.  Exam reveals no gallop.   No murmur heard. Pulses:      Dorsalis pedis pulses are 2+ on the right side, and 2+ on the left side.  Respiratory: No respiratory distress. He has no wheezes. He has no rhonchi. He has no rales.  GI: Soft. Bowel sounds are normal. There is no tenderness.  Musculoskeletal:       Right ankle: He exhibits no swelling.       Left ankle: He exhibits  no swelling.  Lymphadenopathy:    He has no cervical adenopathy.  Neurological: He is alert and oriented to person, place, and time. No cranial nerve deficit.  Skin: Skin is warm. No rash noted. Nails show clubbing.  Psychiatric: He has a normal mood and affect.      Data Reviewed: Basic Metabolic Panel:  Recent Labs Lab 09/04/15 2240 09/05/15 0523  NA 138 138  K 3.4* 3.4*  CL 101 105  CO2 28 25  GLUCOSE 122* 90  BUN 20 14  CREATININE 1.06 0.92  CALCIUM 9.9 9.0   Liver Function Tests:  Recent Labs Lab 09/04/15 2240  AST 22  ALT 18  ALKPHOS 95  BILITOT 0.5  PROT 8.5*  ALBUMIN 4.6    Recent Labs Lab 09/04/15 2240  LIPASE 19   CBC:  Recent Labs Lab 09/04/15 2240 09/05/15 0523 09/05/15 1148  WBC 10.4  --   --   HGB 14.4 13.3 12.7*  HCT 42.8 38.6* 37.4*  MCV 91.4  --   --   PLT 220  --   --      Recent Results (from the past 240 hour(s))  Gastrointestinal Panel by PCR , Stool     Status: None   Collection Time: 09/05/15 12:12 AM  Result Value Ref Range Status   Campylobacter species NOT DETECTED NOT DETECTED Final   Plesimonas shigelloides NOT DETECTED NOT DETECTED Final   Salmonella species NOT DETECTED NOT DETECTED Final   Yersinia  enterocolitica NOT DETECTED NOT DETECTED Final   Vibrio species NOT DETECTED NOT DETECTED Final   Vibrio cholerae NOT DETECTED NOT DETECTED Final   Enteroaggregative E coli (EAEC) NOT DETECTED NOT DETECTED Final   Enteropathogenic E coli (EPEC) NOT DETECTED NOT DETECTED Final   Enterotoxigenic E coli (ETEC) NOT DETECTED NOT DETECTED Final   Shiga like toxin producing E coli (STEC) NOT DETECTED NOT DETECTED Final   E. coli O157 NOT DETECTED NOT DETECTED Final   Shigella/Enteroinvasive E coli (EIEC) NOT DETECTED NOT DETECTED Final   Cryptosporidium NOT DETECTED NOT DETECTED Final   Cyclospora cayetanensis NOT DETECTED NOT DETECTED Final   Entamoeba histolytica NOT DETECTED NOT DETECTED Final   Giardia lamblia NOT  DETECTED NOT DETECTED Final   Adenovirus F40/41 NOT DETECTED NOT DETECTED Final   Astrovirus NOT DETECTED NOT DETECTED Final   Norovirus GI/GII NOT DETECTED NOT DETECTED Final   Rotavirus A NOT DETECTED NOT DETECTED Final   Sapovirus (I, II, IV, and V) NOT DETECTED NOT DETECTED Final  C difficile quick scan w PCR reflex     Status: None   Collection Time: 09/05/15 12:12 AM  Result Value Ref Range Status   C Diff antigen NEGATIVE NEGATIVE Final   C Diff toxin NEGATIVE NEGATIVE Final   C Diff interpretation Negative for C. difficile  Final     Studies: Ct Abdomen Pelvis W Contrast  09/05/2015  CLINICAL DATA:  55 year old male with hematemesis, nausea vomiting and diarrhea. EXAM: CT ABDOMEN AND PELVIS WITH CONTRAST TECHNIQUE: Multidetector CT imaging of the abdomen and pelvis was performed using the standard protocol following bolus administration of intravenous contrast. CONTRAST:  150mL ISOVUE-300 IOPAMIDOL (ISOVUE-300) INJECTION 61% COMPARISON:  CT dated 02/07/2015 FINDINGS: Bibasilar atelectasis/ scarring. Pneumonia is less likely. Clinical correlation is recommended. No intra-abdominal free air. No free fluid. Cholecystectomy. The liver, pancreas, spleen, adrenal glands, kidneys, visualized ureters, and urinary bladder appear unremarkable. A subcentimeter hypodense lesion in the posterior cortex of the left kidney is too small to characterize but appears grossly stable compared to the prior study and likely represents a cyst. The prostate and seminal vesicles are grossly unremarkable. Oral contrast opacifies the stomach and multiple loops of small bowel. There is no evidence of bowel obstruction or active inflammation. Normal appendix. There is aortoiliac atherosclerotic disease. The origins of the celiac axis, SMA, IMA and the origins of the renal arteries appear patent. No portal venous gas identified. The splenic vein, SMV, main portal vein appear patent. There is no adenopathy. Right inguinal  hernia repair mesh. The abdominal wall appears unremarkable. There is degenerative changes of the spine. There bilateral L5 pars defects. No acute fracture. IMPRESSION: No acute intra-abdominal or pelvic pathology. Electronically Signed   By: Anner Crete M.D.   On: 09/05/2015 02:57    Scheduled Meds: . QUEtiapine  300 mg Oral QHS  . venlafaxine  50 mg Oral BID   Continuous Infusions: . 0.9 % NaCl with KCl 20 mEq / L 50 mL/hr at 09/05/15 1303  . pantoprozole (PROTONIX) infusion 8 mg/hr (09/05/15 0440)    Assessment/Plan:  1. Upper GI bleeding. Decrease rate of IV fluids. Continue Protonix drip. Patient will have an endoscopy tomorrow. Serial hemoglobins 2. Diarrhea. GI stool studies negative. This could be secondary to upper GI bleeding 3. Depression. Continue psychiatric medications 4. Hypokalemia replace potassium in IV fluids 5. Patient states that he supposed to have an MRI of his brain ordered by Dr. Francoise Schaumann  Code Status:  Code Status Orders        Start     Ordered   09/05/15 0517  Full code   Continuous     09/05/15 0516    Code Status History    Date Active Date Inactive Code Status Order ID Comments User Context   02/07/2015  6:21 AM 02/07/2015  2:40 PM Full Code [id number]  Lance Coon, MD Inpatient   11/20/2014  6:11 AM 11/21/2014  3:22 PM Full Code [id number]  Harrie Foreman, MD Inpatient   11/06/2014  5:41 PM 11/08/2014  3:35 PM Full Code [id number]  Demetrios Loll, MD Inpatient   09/17/2014  1:58 AM 09/29/2014  6:11 PM Full Code [id number]  Gonzella Lex, MD Inpatient      Disposition Plan: To be determined based on endoscopy results  Consultants:  Gastroenterology  Time spent: 35 minutes  [place], [place]

## 2015-09-05 NOTE — ED Notes (Signed)
Hospitalist at bedside 

## 2015-09-05 NOTE — H&P (Signed)
[facility] NAME: Qusai Pocasangre    MR#:  [id number]  DATE OF BIRTH:  12-Jan-1961  DATE OF ADMISSION:  09/04/2015  PRIMARY CARE PHYSICIAN: Christie Nottingham., PA   REQUESTING/REFERRING PHYSICIAN:   CHIEF COMPLAINT:   Chief Complaint  Patient presents with  . Emesis  . Diarrhea  . Abdominal Pain    HISTORY OF PRESENT ILLNESS: Pavan Stopka  is a 55 y.o. male with a known history of Hypertension, hypothyroidism, COPD, GERD, hyperlipidemia presented to the emergency room for nausea and vomiting and abdominal discomfort. Patients vomitus contains coffee-ground emesis. It's been going on for the last 4 days. Patient said he ate some pinto beans couple of days ago. Patient also has watery diarrhea for the last 2 days. No blood in the stool. Hemoccult was negative in the emergency room. Has abdominal discomfort which is aching in nature 5 out of 10 on a scale of 1-10. Patient never had any EGD or colonoscopy in the past. Patient is allergic to aspirin and does not take any Motrin. Hospitalist service was consulted for further care of the patient. Stool for C. difficile toxin was negative in the emergency room.  PAST MEDICAL HISTORY:   Past Medical History  Diagnosis Date  . Depression   . Hypertension   . Hypothyroid   . COPD (chronic obstructive pulmonary disease) ([place])   . Irritable bowel syndrome (IBS)   . GERD (gastroesophageal reflux disease)   . HLD (hyperlipidemia)   . Asthma     PAST SURGICAL HISTORY: Past Surgical History  Procedure Laterality Date  . Back surgery    . Rotator cuff repair      2007  . Carpal tunnel release Bilateral     SOCIAL HISTORY:  Social History  Substance Use Topics  . Smoking status: Current Every Day Smoker -- 1.00 packs/day    Types: Cigarettes  . Smokeless tobacco: Never Used  . Alcohol Use: No    FAMILY HISTORY:  Family History  Problem Relation Age of Onset  . Dementia Mother   .  Hypertension Father   . Cataracts Father   . Diabetes Sister   . Diabetes Brother     DRUG ALLERGIES:  Allergies  Allergen Reactions  . Aspirin Nausea And Vomiting and Swelling    REVIEW OF SYSTEMS:   CONSTITUTIONAL: No fever, has weakness.  EYES: No blurred or double vision.  EARS, NOSE, AND THROAT: No tinnitus or ear pain.  RESPIRATORY: No cough, shortness of breath, wheezing or hemoptysis.  CARDIOVASCULAR: No chest pain, orthopnea, edema.  GASTROINTESTINAL: Has nausea, vomiting, diarrhea and abdominal pain. coffee ground emesis. GENITOURINARY: No dysuria, hematuria.  ENDOCRINE: No polyuria, nocturia,  HEMATOLOGY: No anemia, easy bruising or bleeding SKIN: No rash or lesion. MUSCULOSKELETAL: No joint pain or arthritis.   NEUROLOGIC: No tingling, numbness, weakness.  PSYCHIATRY: No anxiety or depression.   MEDICATIONS AT HOME:  Prior to Admission medications   Medication Sig Start Date End Date Taking? Authorizing Provider  albuterol (PROVENTIL HFA;VENTOLIN HFA) 108 (90 BASE) MCG/ACT inhaler Inhale 2 puffs into the lungs every 4 (four) hours as needed for wheezing or shortness of breath. 09/25/14  Yes Jolanta B Pucilowska, MD  hydrOXYzine (ATARAX/VISTARIL) 50 MG tablet Take 50 mg by mouth 3 (three) times daily as needed.   Yes Historical Provider, MD  QUEtiapine (SEROQUEL) 300 MG tablet Take 1 tablet (300 mg total) by mouth at bedtime. 09/29/14  Yes Jolanta B  Pucilowska, MD  terbinafine (LAMISIL) 250 MG tablet Take 250 mg by mouth daily.   Yes Historical Provider, MD  venlafaxine (EFFEXOR) 50 MG tablet Take 50 mg by mouth 2 (two) times daily.   Yes Historical Provider, MD  zolpidem (AMBIEN) 5 MG tablet Take 5 mg by mouth at bedtime as needed for sleep.   Yes Historical Provider, MD      PHYSICAL EXAMINATION:   VITAL SIGNS: Blood pressure 129/96, pulse 70, temperature 97.7 F (36.5 C), temperature source Oral, resp. rate 11, height 5' 7.75" (1.721 m), weight 69.4 kg (153 lb),  SpO2 94 %.  GENERAL:  55 y.o.-year-old patient lying in the bed with no acute distress.  EYES: Pupils equal, round, reactive to light and accommodation. No scleral icterus. Extraocular muscles intact.  HEENT: Head atraumatic, normocephalic. Oropharynx dry and nasopharynx clear.  NECK:  Supple, no jugular venous distention. No thyroid enlargement, no tenderness.  LUNGS: Normal breath sounds bilaterally, no wheezing, rales,rhonchi or crepitation. No use of accessory muscles of respiration.  CARDIOVASCULAR: S1, S2 normal. No murmurs, rubs, or gallops.  ABDOMEN: Soft, mild tenderness around umbilicus, nondistended. Bowel sounds present. No organomegaly or mass.  EXTREMITIES: No pedal edema, cyanosis, or clubbing.  NEUROLOGIC: Cranial nerves II through XII are intact. Muscle strength 5/5 in all extremities. Sensation intact. Gait not checked.  PSYCHIATRIC: The patient is alert and oriented x 3.  SKIN: No obvious rash, lesion, or ulcer.   LABORATORY PANEL:   CBC  Recent Labs Lab 09/04/15 2240  WBC 10.4  HGB 14.4  HCT 42.8  PLT 220  MCV 91.4  MCH 30.8  MCHC 33.6  RDW 14.2   ------------------------------------------------------------------------------------------------------------------  Chemistries   Recent Labs Lab 09/04/15 2240  NA 138  K 3.4*  CL 101  CO2 28  GLUCOSE 122*  BUN 20  CREATININE 1.06  CALCIUM 9.9  AST 22  ALT 18  ALKPHOS 95  BILITOT 0.5   ------------------------------------------------------------------------------------------------------------------ estimated creatinine clearance is 75.5 mL/min (by C-G formula based on Cr of 1.06). ------------------------------------------------------------------------------------------------------------------ No results for input(s): TSH, T4TOTAL, T3FREE, THYROIDAB in the last 72 hours.  Invalid input(s): FREET3   Coagulation profile  Recent Labs Lab 09/04/15 2240  INR 0.91    ------------------------------------------------------------------------------------------------------------------- No results for input(s): DDIMER in the last 72 hours. -------------------------------------------------------------------------------------------------------------------  Cardiac Enzymes No results for input(s): CKMB, TROPONINI, MYOGLOBIN in the last 168 hours.  Invalid input(s): CK ------------------------------------------------------------------------------------------------------------------ Invalid input(s): POCBNP  ---------------------------------------------------------------------------------------------------------------  Urinalysis    Component Value Date/Time   COLORURINE YELLOW* 02/07/2015 0100   APPEARANCEUR CLEAR* 02/07/2015 0100   LABSPEC 1.021 02/07/2015 0100   PHURINE 6.0 02/07/2015 0100   GLUCOSEU NEGATIVE 02/07/2015 0100   HGBUR NEGATIVE 02/07/2015 0100   BILIRUBINUR NEGATIVE 02/07/2015 0100   KETONESUR NEGATIVE 02/07/2015 0100   PROTEINUR NEGATIVE 02/07/2015 0100   NITRITE NEGATIVE 02/07/2015 0100   LEUKOCYTESUR NEGATIVE 02/07/2015 0100     RADIOLOGY: Ct Abdomen Pelvis W Contrast  09/05/2015  CLINICAL DATA:  55 year old male with hematemesis, nausea vomiting and diarrhea. EXAM: CT ABDOMEN AND PELVIS WITH CONTRAST TECHNIQUE: Multidetector CT imaging of the abdomen and pelvis was performed using the standard protocol following bolus administration of intravenous contrast. CONTRAST:  113mL ISOVUE-300 IOPAMIDOL (ISOVUE-300) INJECTION 61% COMPARISON:  CT dated 02/07/2015 FINDINGS: Bibasilar atelectasis/ scarring. Pneumonia is less likely. Clinical correlation is recommended. No intra-abdominal free air. No free fluid. Cholecystectomy. The liver, pancreas, spleen, adrenal glands, kidneys, visualized ureters, and urinary bladder appear unremarkable. A subcentimeter hypodense lesion in the posterior  cortex of the left kidney is too small to characterize  but appears grossly stable compared to the prior study and likely represents a cyst. The prostate and seminal vesicles are grossly unremarkable. Oral contrast opacifies the stomach and multiple loops of small bowel. There is no evidence of bowel obstruction or active inflammation. Normal appendix. There is aortoiliac atherosclerotic disease. The origins of the celiac axis, SMA, IMA and the origins of the renal arteries appear patent. No portal venous gas identified. The splenic vein, SMV, main portal vein appear patent. There is no adenopathy. Right inguinal hernia repair mesh. The abdominal wall appears unremarkable. There is degenerative changes of the spine. There bilateral L5 pars defects. No acute fracture. IMPRESSION: No acute intra-abdominal or pelvic pathology. Electronically Signed   By: Anner Crete M.D.   On: 09/05/2015 02:57    EKG: Orders placed or performed during the hospital encounter of 02/06/15  . EKG 12-Lead  . EKG 12-Lead    IMPRESSION AND PLAN: 55 year old male patient with history of hypertension, hypothyroidism, hyperlipidemia, COPD presented to the emergency room with abdominal pain and nausea and vomiting. Vomitus contains coffee-ground emesis. Admitting diagnosis 1. Acute gastrointestinal bleeding 2. Dehydration 3. Hypokalemia 4. Abdominal discomfort 5. Intractable nausea vomiting 6. Diarrhea Treatment plan Admit patient to medical floor IV fluid hydration with potassium supplementation Pain management with IV morphine Follow-up hemoglobin and hematocrit Gastroenterology consultation DVT prophylaxis sequential compression devices to lower extremities Avoid blood thinner medication.  All the records are reviewed and case discussed with ED provider. Management plans discussed with the patient, family and they are in agreement.  CODE STATUS:FULL Code Status History    Date Active Date Inactive Code Status Order ID Comments User Context   02/07/2015  6:21 AM  02/07/2015  2:40 PM Full Code [id number]  Lance Coon, MD Inpatient   11/20/2014  6:11 AM 11/21/2014  3:22 PM Full Code [id number]  Harrie Foreman, MD Inpatient   11/06/2014  5:41 PM 11/08/2014  3:35 PM Full Code [id number]  Demetrios Loll, MD Inpatient   09/17/2014  1:58 AM 09/29/2014  6:11 PM Full Code [id number]  Gonzella Lex, MD Inpatient       TOTAL TIME TAKING CARE OF THIS PATIENT:52 minutes.    Saundra Shelling M.D on 09/05/2015 at 4:32 AM  Between 7am to 6pm - Pager - [phone]  After 6pm go to [email or web address] - password [email or web address] [facility]  [facility]  Office  [phone]  CC: Primary care physician; Christie Nottingham., PA

## 2015-09-06 ENCOUNTER — Encounter
Admission: EM | Disposition: A | Discharge status: Home / Self Care | Payer: Self-pay | Source: Home / Self Care | Attending: Internal Medicine

## 2015-09-06 ENCOUNTER — Inpatient Hospital Stay: Payer: Medicaid Other | Admitting: Anesthesiology

## 2015-09-06 ENCOUNTER — Encounter: Payer: Self-pay | Admitting: *Deleted

## 2015-09-06 HISTORY — PX: ESOPHAGOGASTRODUODENOSCOPY (EGD) WITH PROPOFOL: SHX5813

## 2015-09-06 LAB — HEMOGLOBIN AND HEMATOCRIT, BLOOD
HCT: 41.1 % (ref 40.0–52.0)
HCT: 39.7 % — ABNORMAL LOW (ref 40.0–52.0)
HEMATOCRIT: 39.7 % — AB (ref 40.0–52.0)
HEMOGLOBIN: 13.5 g/dL (ref 13.0–18.0)
HEMOGLOBIN: 13.7 g/dL (ref 13.0–18.0)
Hemoglobin: 13.6 g/dL (ref 13.0–18.0)

## 2015-09-06 SURGERY — ESOPHAGOGASTRODUODENOSCOPY (EGD) WITH PROPOFOL
Anesthesia: General

## 2015-09-06 MED ORDER — LIDOCAINE 2% (20 MG/ML) 5 ML SYRINGE
INTRAMUSCULAR | Status: DC | PRN
  Administered 2015-09-06: 30 mg via INTRAVENOUS

## 2015-09-06 MED ORDER — PANTOPRAZOLE SODIUM 40 MG PO TBEC
40.0000 mg | DELAYED_RELEASE_TABLET | Freq: Every day | ORAL | Status: DC
  Administered 2015-09-06: 40 mg via ORAL
  Filled 2015-09-06: qty 1

## 2015-09-06 MED ORDER — PROPOFOL 10 MG/ML IV BOLUS
INTRAVENOUS | Status: DC | PRN
  Administered 2015-09-06: 100 mg via INTRAVENOUS

## 2015-09-06 MED ORDER — OMEPRAZOLE 20 MG PO CPDR: 20.0000 mg | DELAYED_RELEASE_CAPSULE | Freq: Two times a day (BID) | ORAL | Status: DC

## 2015-09-06 MED ORDER — PROPOFOL 500 MG/50ML IV EMUL
INTRAVENOUS | Status: DC | PRN
  Administered 2015-09-06: 160 ug/kg/min via INTRAVENOUS

## 2015-09-06 MED ORDER — SODIUM CHLORIDE 0.9 % IV SOLN
INTRAVENOUS | Status: DC
  Administered 2015-09-06: 12:00:00 via INTRAVENOUS

## 2015-09-06 MED ORDER — FENTANYL CITRATE (PF) 100 MCG/2ML IJ SOLN
INTRAMUSCULAR | Status: DC | PRN
  Administered 2015-09-06: 50 ug via INTRAVENOUS

## 2015-09-06 MED ORDER — MIDAZOLAM HCL 5 MG/5ML IJ SOLN
INTRAMUSCULAR | Status: DC | PRN
  Administered 2015-09-06: 1 mg via INTRAVENOUS

## 2015-09-06 NOTE — Progress Notes (Signed)
Pt A and O x 4. VSS. Pt tolerating diet well. No complaints of pain or nausea. IV removed intact, prescriptions given. Pt voiced understanding of discharge instructions with no further questions. Pt picked up by ACT Team. Pt discharged via wheelchair with axillary.

## 2015-09-06 NOTE — Discharge Summary (Signed)
[facility] [place] NAME: Christopher Mason    MR#:  [id number]  DATE OF BIRTH:  03-16-1961  DATE OF ADMISSION:  09/04/2015 ADMITTING PHYSICIAN: Saundra Shelling, MD  DATE OF DISCHARGE: 09/06/2015  PRIMARY CARE PHYSICIAN: Christie Nottingham., PA    ADMISSION DIAGNOSIS:  Acute upper GI bleed [K92.2]  DISCHARGE DIAGNOSIS:  Principal Problem:   GI bleeding   SECONDARY DIAGNOSIS:   Past Medical History  Diagnosis Date  . Depression   . Hypertension   . Hypothyroid   . COPD (chronic obstructive pulmonary disease) ([place])   . Irritable bowel syndrome (IBS)   . GERD (gastroesophageal reflux disease)   . HLD (hyperlipidemia)   . Asthma     HOSPITAL COURSE:   1. Upper GI bleed with hematemesis. Patient's hemoglobin remained stable during the entire hospital course. Patient was on a Protonix drip. Endoscopy by Dr. Candace Cruise gastroenterology showed grade B reflux esophagitis. Omeprazole prescribed 20 mg twice a day upon discharge home. 2. Diarrhea. Stool studies were negative. 3. Depression continue psychiatric medications 4. Hypokalemia was replaced and IV fluids 5. Patient states that he was supposed to have an MRI of his brain ordered by Dr. Francoise Schaumann. This can be done as outpatient  DISCHARGE CONDITIONS:  Satisfactory  CONSULTS OBTAINED:  Gastroenterology  DRUG ALLERGIES:   Allergies  Allergen Reactions  . Aspirin Nausea And Vomiting and Swelling    DISCHARGE MEDICATIONS:   Current Discharge Medication List    START taking these medications   Details  omeprazole (PRILOSEC) 20 MG capsule Take 1 capsule (20 mg total) by mouth 2 (two) times daily before a meal. Qty: 60 capsule, Refills: 0      CONTINUE these medications which have NOT CHANGED   Details  albuterol (PROVENTIL HFA;VENTOLIN HFA) 108 (90 BASE) MCG/ACT inhaler Inhale 2 puffs into the lungs every 4 (four) hours as needed for wheezing or shortness of breath. Qty: 1 each, Refills:  0    hydrOXYzine (ATARAX/VISTARIL) 50 MG tablet Take 50 mg by mouth 3 (three) times daily as needed.    QUEtiapine (SEROQUEL) 300 MG tablet Take 1 tablet (300 mg total) by mouth at bedtime. Qty: 30 tablet, Refills: 0    venlafaxine (EFFEXOR) 50 MG tablet Take 50 mg by mouth 2 (two) times daily.    zolpidem (AMBIEN) 5 MG tablet Take 5 mg by mouth at bedtime as needed for sleep.      STOP taking these medications     terbinafine (LAMISIL) 250 MG tablet          DISCHARGE INSTRUCTIONS:   Follow-up medical doctor one week  If you experience worsening of your admission symptoms, develop shortness of breath, life threatening emergency, suicidal or homicidal thoughts you must seek medical attention immediately by calling 911 or calling your MD immediately  if symptoms less severe.  You Must read complete instructions/literature along with all the possible adverse reactions/side effects for all the Medicines you take and that have been prescribed to you. Take any new Medicines after you have completely understood and accept all the possible adverse reactions/side effects.   Please note  You were cared for by a hospitalist during your hospital stay. If you have any questions about your discharge medications or the care you received while you were in the hospital after you are discharged, you can call the unit and asked to speak with the hospitalist on call if the hospitalist that took care of you is  not available. Once you are discharged, your primary care physician will handle any further medical issues. Please note that NO REFILLS for any discharge medications will be authorized once you are discharged, as it is imperative that you return to your primary care physician (or establish a relationship with a primary care physician if you do not have one) for your aftercare needs so that they can reassess your need for medications and monitor your lab values.    Today   CHIEF COMPLAINT:    Chief Complaint  Patient presents with  . Emesis  . Diarrhea  . Abdominal Pain    HISTORY OF PRESENT ILLNESS:  Christopher Mason  is a 55 y.o. male presented with nausea vomiting and diarrhea. Stated he had vomited blood.   VITAL SIGNS:  Blood pressure 117/90, pulse 64, temperature 97.9 F (36.6 C), temperature source Oral, resp. rate 11, height 5' 7.75" (1.721 m), weight 69.4 kg (153 lb), SpO2 96 %.    PHYSICAL EXAMINATION:  GENERAL:  55 y.o.-year-old patient lying in the bed with no acute distress.  EYES: Pupils equal, round, reactive to light and accommodation. No scleral icterus. Extraocular muscles intact.  HEENT: Head atraumatic, normocephalic. Oropharynx and nasopharynx clear.  NECK:  Supple, no jugular venous distention. No thyroid enlargement, no tenderness.  LUNGS: Normal breath sounds bilaterally, no wheezing, rales,rhonchi or crepitation. No use of accessory muscles of respiration.  CARDIOVASCULAR: S1, S2 normal. No murmurs, rubs, or gallops.  ABDOMEN: Soft, non-tender, non-distended. Bowel sounds present. No organomegaly or mass.  EXTREMITIES: No pedal edema, cyanosis, or clubbing.  NEUROLOGIC: Cranial nerves II through XII are intact. Muscle strength 5/5 in all extremities. Sensation intact. Gait not checked.  PSYCHIATRIC: The patient is alert.  SKIN: No obvious rash, lesion, or ulcer.   DATA REVIEW:   CBC  Recent Labs Lab 09/04/15 2240  09/06/15 1100  WBC 10.4  --   --   HGB 14.4  < > 13.7  HCT 42.8  < > 39.7*  PLT 220  --   --   < > = values in this interval not displayed.  Chemistries   Recent Labs Lab 09/04/15 2240 09/05/15 0523  NA 138 138  K 3.4* 3.4*  CL 101 105  CO2 28 25  GLUCOSE 122* 90  BUN 20 14  CREATININE 1.06 0.92  CALCIUM 9.9 9.0  AST 22  --   ALT 18  --   ALKPHOS 95  --   BILITOT 0.5  --     Microbiology Results  Results for orders placed or performed during the hospital encounter of 09/04/15  Gastrointestinal Panel by PCR  , Stool     Status: None   Collection Time: 09/05/15 12:12 AM  Result Value Ref Range Status   Campylobacter species NOT DETECTED NOT DETECTED Final   Plesimonas shigelloides NOT DETECTED NOT DETECTED Final   Salmonella species NOT DETECTED NOT DETECTED Final   Yersinia enterocolitica NOT DETECTED NOT DETECTED Final   Vibrio species NOT DETECTED NOT DETECTED Final   Vibrio cholerae NOT DETECTED NOT DETECTED Final   Enteroaggregative E coli (EAEC) NOT DETECTED NOT DETECTED Final   Enteropathogenic E coli (EPEC) NOT DETECTED NOT DETECTED Final   Enterotoxigenic E coli (ETEC) NOT DETECTED NOT DETECTED Final   Shiga like toxin producing E coli (STEC) NOT DETECTED NOT DETECTED Final   E. coli O157 NOT DETECTED NOT DETECTED Final   Shigella/Enteroinvasive E coli (EIEC) NOT DETECTED NOT DETECTED Final   Cryptosporidium NOT  DETECTED NOT DETECTED Final   Cyclospora cayetanensis NOT DETECTED NOT DETECTED Final   Entamoeba histolytica NOT DETECTED NOT DETECTED Final   Giardia lamblia NOT DETECTED NOT DETECTED Final   Adenovirus F40/41 NOT DETECTED NOT DETECTED Final   Astrovirus NOT DETECTED NOT DETECTED Final   Norovirus GI/GII NOT DETECTED NOT DETECTED Final   Rotavirus A NOT DETECTED NOT DETECTED Final   Sapovirus (I, II, IV, and V) NOT DETECTED NOT DETECTED Final  C difficile quick scan w PCR reflex     Status: None   Collection Time: 09/05/15 12:12 AM  Result Value Ref Range Status   C Diff antigen NEGATIVE NEGATIVE Final   C Diff toxin NEGATIVE NEGATIVE Final   C Diff interpretation Negative for C. difficile  Final    RADIOLOGY:  Ct Abdomen Pelvis W Contrast  09/05/2015  CLINICAL DATA:  55 year old male with hematemesis, nausea vomiting and diarrhea. EXAM: CT ABDOMEN AND PELVIS WITH CONTRAST TECHNIQUE: Multidetector CT imaging of the abdomen and pelvis was performed using the standard protocol following bolus administration of intravenous contrast. CONTRAST:  120mL ISOVUE-300 IOPAMIDOL  (ISOVUE-300) INJECTION 61% COMPARISON:  CT dated 02/07/2015 FINDINGS: Bibasilar atelectasis/ scarring. Pneumonia is less likely. Clinical correlation is recommended. No intra-abdominal free air. No free fluid. Cholecystectomy. The liver, pancreas, spleen, adrenal glands, kidneys, visualized ureters, and urinary bladder appear unremarkable. A subcentimeter hypodense lesion in the posterior cortex of the left kidney is too small to characterize but appears grossly stable compared to the prior study and likely represents a cyst. The prostate and seminal vesicles are grossly unremarkable. Oral contrast opacifies the stomach and multiple loops of small bowel. There is no evidence of bowel obstruction or active inflammation. Normal appendix. There is aortoiliac atherosclerotic disease. The origins of the celiac axis, SMA, IMA and the origins of the renal arteries appear patent. No portal venous gas identified. The splenic vein, SMV, main portal vein appear patent. There is no adenopathy. Right inguinal hernia repair mesh. The abdominal wall appears unremarkable. There is degenerative changes of the spine. There bilateral L5 pars defects. No acute fracture. IMPRESSION: No acute intra-abdominal or pelvic pathology. Electronically Signed   By: Anner Crete M.D.   On: 09/05/2015 02:57    Management plans discussed with the patient and he is in agreement.  CODE STATUS:     Code Status Orders        Start     Ordered   09/05/15 0517  Full code   Continuous     09/05/15 0516    Code Status History    Date Active Date Inactive Code Status Order ID Comments User Context   02/07/2015  6:21 AM 02/07/2015  2:40 PM Full Code [id number]  Lance Coon, MD Inpatient   11/20/2014  6:11 AM 11/21/2014  3:22 PM Full Code [id number]  Harrie Foreman, MD Inpatient   11/06/2014  5:41 PM 11/08/2014  3:35 PM Full Code [id number]  Demetrios Loll, MD Inpatient   09/17/2014  1:58 AM 09/29/2014  6:11 PM Full Code [id number]  Gonzella Lex, MD Inpatient      TOTAL TIME TAKING CARE OF THIS PATIENT:  23minutes.    Loletha Grayer M.D on 09/06/2015 at 2:13 PM  Between 7am to 6pm - Pager - [phone]  After 6pm go to [email or web address] - password [email or web address] [facility]  [phone]  CC: Primary care physician; Christie Nottingham., PA

## 2015-09-06 NOTE — Op Note (Signed)
No further bleeding. EGD only showed reflux esophagitis. No active bleeding. Reg diet ordered. Make sure pt stays on daily PPI. Will sign off. Thanks.

## 2015-09-06 NOTE — Anesthesia Postprocedure Evaluation (Signed)
Anesthesia Post Note  Patient: Christopher Mason  Procedure(s) Performed: Procedure(s) (LRB): ESOPHAGOGASTRODUODENOSCOPY (EGD) WITH PROPOFOL (N/A)  Patient location during evaluation: PACU Anesthesia Type: General Level of consciousness: awake and alert Pain management: pain level controlled Vital Signs Assessment: post-procedure vital signs reviewed and stable Respiratory status: spontaneous breathing, nonlabored ventilation, respiratory function stable and patient connected to nasal cannula oxygen Cardiovascular status: blood pressure returned to baseline and stable Postop Assessment: no signs of nausea or vomiting Anesthetic complications: no    Last Vitals:  Filed Vitals:   09/06/15 1310 09/06/15 1329  BP: 124/98 117/90  Pulse: 63 64  Temp:  36.6 C  Resp: 11     Last Pain:  Filed Vitals:   09/06/15 1330  PainSc: [place] Arie Gable

## 2015-09-06 NOTE — Progress Notes (Signed)
When pt returns to floor from Endo okay to resume feedings, discontinue order for Dextrose fluids, and resume q 4 hour fingerticks instead of q2hrs.

## 2015-09-06 NOTE — Transfer of Care (Signed)
Immediate Anesthesia Transfer of Care Note  Patient: Christopher Mason  Procedure(s) Performed: Procedure(s): ESOPHAGOGASTRODUODENOSCOPY (EGD) WITH PROPOFOL (N/A)  Patient Location: PACU and Endoscopy Unit  Anesthesia Type:General  Level of Consciousness: sedated  Airway & Oxygen Therapy: Patient Spontanous Breathing and Patient connected to nasal cannula oxygen  Post-op Assessment: Report given to RN and Post -op Vital signs reviewed and stable  Post vital signs: Reviewed and stable  Last Vitals:  Filed Vitals:   09/06/15 0440 09/06/15 1207  BP: 132/97 135/99  Pulse: 65 67  Temp: 36.7 C 37 C  Resp: 20 18    Last Pain:  Filed Vitals:   09/06/15 1208  PainSc: 8       Patients Stated Pain Goal: 0 ([id number])  Complications: No apparent anesthesia complications

## 2015-09-06 NOTE — Anesthesia Preprocedure Evaluation (Signed)
Anesthesia Evaluation  Patient identified by MRN, date of birth, ID band Patient awake    Reviewed: Allergy & Precautions, H&P , NPO status , Patient's Chart, lab work & pertinent test results, reviewed documented beta blocker date and time   Airway Mallampati: II   Neck ROM: full    Dental  (+) Edentulous Upper, Edentulous Lower   Pulmonary neg pulmonary ROS, asthma , COPD,  COPD inhaler, Current Smoker,    Pulmonary exam normal        Cardiovascular hypertension, negative cardio ROS Normal cardiovascular exam Rate:Normal     Neuro/Psych PSYCHIATRIC DISORDERS negative neurological ROS  negative psych ROS   GI/Hepatic negative GI ROS, Neg liver ROS, GERD  Medicated,  Endo/Other  negative endocrine ROSHypothyroidism   Renal/GU Renal diseasenegative Renal ROS  negative genitourinary   Musculoskeletal   Abdominal   Peds  Hematology negative hematology ROS (+)   Anesthesia Other Findings Past Medical History:   Depression                                                   Hypertension                                                 Hypothyroid                                                  COPD (chronic obstructive pulmonary disease) (*              Irritable bowel syndrome (IBS)                               GERD (gastroesophageal reflux disease)                       HLD (hyperlipidemia)                                         Asthma                                                     Past Surgical History:   BACK SURGERY                                                  ROTATOR CUFF REPAIR                                             Comment:2007   CARPAL TUNNEL RELEASE  Bilateral            BMI    Body Mass Index   23.43 kg/m 2     Reproductive/Obstetrics                             Anesthesia Physical Anesthesia Plan  ASA: III and emergent  Anesthesia  Plan: General   Post-op Pain Management:    Induction:   Airway Management Planned:   Additional Equipment:   Intra-op Plan:   Post-operative Plan:   Informed Consent: I have reviewed the patients History and Physical, chart, labs and discussed the procedure including the risks, benefits and alternatives for the proposed anesthesia with the patient or authorized representative who has indicated his/her understanding and acceptance.   Dental Advisory Given  Plan Discussed with: CRNA  Anesthesia Plan Comments:         Anesthesia Quick Evaluation

## 2015-09-06 NOTE — Discharge Instructions (Signed)
Hematemesis Hematemesis is when you vomit blood. It is a sign of bleeding in the upper part of your digestive tract. This is also called your gastrointestinal (GI) tract. Your upper GI tract includes your mouth, throat, esophagus, stomach, and the first part of your small intestine (duodenum).  Hematemesis is usually caused by bleeding from your esophagus or stomach. You may suddenly vomit bright red blood. You might also vomit old blood. It may look like coffee grounds. You may also have other symptoms, such as:  Stomach pain.  Heartburn.  Black and tarry stool.  HOME CARE INSTRUCTIONS  Watch your hematemesis for any changes. The following actions may help to lessen any discomfort you are feeling:  Take medicines only as directed by your health care provider. Do not take aspirin, ibuprofen, or any other anti-inflammatory medicine without approval from your health care provider.  Rest as needed.  Drink small sips of clear liquids often, as long as you can keep them down. Try to drink enough fluids to keep your urine clear or pale yellow.  Do not drink alcohol.  Do not use any tobacco products, including cigarettes, chewing tobacco, or electronic cigarettes. If you need help quitting, ask your health care provider.  Keep all follow-up visits as directed by your health care provider. This is important. SEEK MEDICAL CARE IF:   The vomiting of blood worsens, or begins again after it has stopped.  You have persistent stomach pain.  You have nausea, indigestion, or heartburn.  You feel weak or dizzy. SEEK IMMEDIATE MEDICAL CARE IF:   You faint or feel extremely weak.  You have a rapid heartbeat.  You are urinating less than normal or not at all.  You have persistent vomiting.  You vomit large amounts of bloody or dark material.  You vomit bright red blood.  You pass large, dark, or bloody stools.  You have chest pain or trouble breathing.   This information is not  intended to replace advice given to you by your health care provider. Make sure you discuss any questions you have with your health care provider.   Document Released: 05/18/2004 Document Revised: 05/01/2014 Document Reviewed: 12/03/2013 Elsevier Interactive Patient Education [employer].

## 2015-09-06 NOTE — Op Note (Signed)
[facility] Gastroenterology Patient Name: Christopher Mason Procedure Date: 09/06/2015 12:25 PM MRN: [id number] Account #: [id number] Date of Birth: 1960-06-11 Admit Type: Inpatient Age: 55 Room: [facility] ENDO ROOM 4 Gender: Male Note Status: Finalized Procedure:            Upper GI endoscopy Indications:          Coffee-ground emesis Providers:            Lupita Dawn. Candace Cruise, MD Referring MD:         Leighton Ruff. Radford Pax, RN (Referring MD) Medicines:            Monitored Anesthesia Care Complications:        No immediate complications. Procedure:            Pre-Anesthesia Assessment:                       - Prior to the procedure, a History and Physical was                        performed, and patient medications, allergies and                        sensitivities were reviewed. The patient's tolerance of                        previous anesthesia was reviewed.                       - The risks and benefits of the procedure and the                        sedation options and risks were discussed with the                        patient. All questions were answered and informed                        consent was obtained.                       - After reviewing the risks and benefits, the patient                        was deemed in satisfactory condition to undergo the                        procedure.                       After obtaining informed consent, the endoscope was                        passed under direct vision. Throughout the procedure,                        the patient's blood pressure, pulse, and oxygen                        saturations were monitored continuously. The Endoscope  was introduced through the mouth, and advanced to the                        second part of duodenum. The upper GI endoscopy was                        accomplished without difficulty. The patient tolerated                        the procedure well. Findings:      LA  Grade B (one or more mucosal breaks greater than 5 mm, not extending       between the tops of two mucosal folds) esophagitis with no bleeding was       found at the gastroesophageal junction. Biopsies were taken with a cold       forceps for histology.      The exam was otherwise without abnormality.      The entire examined stomach was normal.      The examined duodenum was normal. Impression:           - LA Grade B reflux esophagitis. Biopsied.                       - The examination was otherwise normal.                       - Normal stomach.                       - Normal examined duodenum. Recommendation:       - Observe patient's clinical course.                       - Continue present medications.                       - The findings and recommendations were discussed with                        the patient. Procedure Code(s):    --- Professional ---                       [phone], Esophagogastroduodenoscopy, flexible, transoral;                        with biopsy, single or multiple Diagnosis Code(s):    --- Professional ---                       K21.0, Gastro-esophageal reflux disease with esophagitis                       K92.0, Hematemesis CPT copyright 2016 American Medical Association. All rights reserved. The codes documented in this report are preliminary and upon coder review may  be revised to meet current compliance requirements. Hulen Luster, MD 09/06/2015 12:36:26 PM This report has been signed electronically. Number of Addenda: 0 Note Initiated On: 09/06/2015 12:25 PM      [facility]

## 2015-09-07 LAB — SURGICAL PATHOLOGY

## 2015-09-08 ENCOUNTER — Encounter: Payer: Self-pay | Admitting: Gastroenterology

## 2016-01-02 ENCOUNTER — Emergency Department
Admission: EM | Admit: 2016-01-02 | Discharge: 2016-01-02 | Disposition: A | Discharge status: Home / Self Care | Payer: Medicaid Other | Attending: Emergency Medicine | Admitting: Emergency Medicine

## 2016-01-02 ENCOUNTER — Encounter: Payer: Self-pay | Admitting: Emergency Medicine

## 2016-01-02 DIAGNOSIS — J45909 Unspecified asthma, uncomplicated: Secondary | ICD-10-CM | POA: Diagnosis not present

## 2016-01-02 DIAGNOSIS — E039 Hypothyroidism, unspecified: Secondary | ICD-10-CM | POA: Diagnosis not present

## 2016-01-02 DIAGNOSIS — F17210 Nicotine dependence, cigarettes, uncomplicated: Secondary | ICD-10-CM | POA: Insufficient documentation

## 2016-01-02 DIAGNOSIS — I10 Essential (primary) hypertension: Secondary | ICD-10-CM | POA: Diagnosis not present

## 2016-01-02 DIAGNOSIS — K529 Noninfective gastroenteritis and colitis, unspecified: Secondary | ICD-10-CM | POA: Insufficient documentation

## 2016-01-02 DIAGNOSIS — J449 Chronic obstructive pulmonary disease, unspecified: Secondary | ICD-10-CM | POA: Diagnosis not present

## 2016-01-02 DIAGNOSIS — R112 Nausea with vomiting, unspecified: Secondary | ICD-10-CM | POA: Diagnosis present

## 2016-01-02 LAB — CBC
HCT: 42.7 % (ref 40.0–52.0)
Hemoglobin: 14.5 g/dL (ref 13.0–18.0)
MCH: 29.8 pg (ref 26.0–34.0)
MCHC: 33.9 g/dL (ref 32.0–36.0)
MCV: 87.9 fL (ref 80.0–100.0)
PLATELETS: 235 10*3/uL (ref 150–440)
RBC: 4.85 MIL/uL (ref 4.40–5.90)
RDW: 15.3 % — ABNORMAL HIGH (ref 11.5–14.5)
WBC: 7.2 10*3/uL (ref 3.8–10.6)

## 2016-01-02 LAB — COMPREHENSIVE METABOLIC PANEL
ALK PHOS: 97 U/L (ref 38–126)
ALT: 18 U/L (ref 17–63)
AST: 19 U/L (ref 15–41)
Albumin: 4.1 g/dL (ref 3.5–5.0)
Anion gap: 7 (ref 5–15)
BILIRUBIN TOTAL: 0.3 mg/dL (ref 0.3–1.2)
BUN: 16 mg/dL (ref 6–20)
CALCIUM: 9.3 mg/dL (ref 8.9–10.3)
CO2: 23 mmol/L (ref 22–32)
CREATININE: 0.67 mg/dL (ref 0.61–1.24)
Chloride: 108 mmol/L (ref 101–111)
Glucose, Bld: 114 mg/dL — ABNORMAL HIGH (ref 65–99)
Potassium: 3.6 mmol/L (ref 3.5–5.1)
SODIUM: 138 mmol/L (ref 135–145)
TOTAL PROTEIN: 7.7 g/dL (ref 6.5–8.1)

## 2016-01-02 LAB — LIPASE, BLOOD: Lipase: 19 U/L (ref 11–51)

## 2016-01-02 MED ORDER — ONDANSETRON HCL 4 MG PO TABS: 4.0000 mg | ORAL_TABLET | Freq: Every day | ORAL | 1 refills | Status: DC | PRN

## 2016-01-02 MED ORDER — SODIUM CHLORIDE 0.9 % IV SOLN
1000.0000 mL | Freq: Once | INTRAVENOUS | Status: AC
  Administered 2016-01-02: 1000 mL via INTRAVENOUS

## 2016-01-02 MED ORDER — ONDANSETRON HCL 4 MG/2ML IJ SOLN
4.0000 mg | Freq: Once | INTRAMUSCULAR | Status: AC
  Administered 2016-01-02: 4 mg via INTRAVENOUS
  Filled 2016-01-02: qty 2

## 2016-01-02 NOTE — ED Triage Notes (Signed)
Pt presents to ED via EMS from home with c/o vomiting and diarrhea that started around 0400 this morning. Pt denies any sick contacts.  Pt states he hasn't taken any medications for his sxs. Pt also c/o generalized aches and pains as well.

## 2016-01-02 NOTE — ED Notes (Signed)
Reviewed d/c instructions, follow-up care and prescription with pt. Pt verbalized understanding

## 2016-01-02 NOTE — ED Provider Notes (Signed)
[facility] Emergency Department Provider Note   ____________________________________________    I have reviewed the triage vital signs and the nursing notes.   HISTORY  Chief Complaint Nausea and Diarrhea     HPI Christopher Mason is a 55 y.o. male who presents with complaints of nausea vomiting and diarrhea. He reports mild abdominal cramping that is diffuse as well. He denies fevers or chills. He does report bodyaches. He denies sick contacts. No recent travel. He has no blood in his vomit or stool   Past Medical History:  Diagnosis Date  . Asthma   . COPD (chronic obstructive pulmonary disease) ([place])   . Depression   . GERD (gastroesophageal reflux disease)   . HLD (hyperlipidemia)   . Hypertension   . Hypothyroid   . Irritable bowel syndrome (IBS)     Patient Active Problem List   Diagnosis Date Noted  . GI bleeding 09/05/2015  . HLD (hyperlipidemia) 02/07/2015  . COPD (chronic obstructive pulmonary disease) ([place]) 02/07/2015  . Hyponatremia 02/07/2015  . Gastritis 02/07/2015  . Acute kidney injury ([place]) 11/20/2014  . ARF (acute renal failure) ([place]) 11/06/2014  . Hypokalemia 11/06/2014  . Hypotension 11/06/2014  . Cannabis use disorder, severe, dependence ([place]) 09/24/2014  . Undifferentiated schizophrenia ([place])   . Hypothyroidism 09/17/2014  . GERD (gastroesophageal reflux disease) 09/17/2014  . Tobacco use disorder 09/17/2014  . Non compliance w medication regimen 09/16/2014  . Asthma 09/16/2014  . HTN (hypertension) 09/16/2014  . Tardive dyskinesia 09/16/2014    Past Surgical History:  Procedure Laterality Date  . BACK SURGERY    . CARPAL TUNNEL RELEASE Bilateral   . ESOPHAGOGASTRODUODENOSCOPY (EGD) WITH PROPOFOL N/A 09/06/2015   Procedure: ESOPHAGOGASTRODUODENOSCOPY (EGD) WITH PROPOFOL;  Surgeon: Hulen Luster, MD;  Location: [facility] ENDOSCOPY;  Service: Endoscopy;  Laterality: N/A;  . ROTATOR CUFF REPAIR     2007    Prior to  Admission medications   Medication Sig Start Date End Date Taking? Authorizing Provider  albuterol (PROVENTIL HFA;VENTOLIN HFA) 108 (90 BASE) MCG/ACT inhaler Inhale 2 puffs into the lungs every 4 (four) hours as needed for wheezing or shortness of breath. 09/25/14   Clovis Fredrickson, MD  hydrOXYzine (ATARAX/VISTARIL) 50 MG tablet Take 50 mg by mouth 3 (three) times daily as needed.    Historical Provider, MD  omeprazole (PRILOSEC) 20 MG capsule Take 1 capsule (20 mg total) by mouth 2 (two) times daily before a meal. 09/06/15   Loletha Grayer, MD  ondansetron (ZOFRAN) 4 MG tablet Take 1 tablet (4 mg total) by mouth daily as needed for nausea or vomiting. 01/02/16   Lavonia Drafts, MD  QUEtiapine (SEROQUEL) 300 MG tablet Take 1 tablet (300 mg total) by mouth at bedtime. 09/29/14   Clovis Fredrickson, MD  venlafaxine (EFFEXOR) 50 MG tablet Take 50 mg by mouth 2 (two) times daily.    Historical Provider, MD  zolpidem (AMBIEN) 5 MG tablet Take 5 mg by mouth at bedtime as needed for sleep.    Historical Provider, MD     Allergies Aspirin  Family History  Problem Relation Age of Onset  . Hypertension Father   . Cataracts Father   . Diabetes Sister   . Diabetes Brother   . Dementia Mother     Social History Social History  Substance Use Topics  . Smoking status: Current Every Day Smoker    Packs/day: 1.00    Types: Cigarettes  . Smokeless tobacco: Never Used  . Alcohol  use No    Review of Systems  Constitutional: No fever/chills Eyes: No visual changes.  ENT: No sore throat. Cardiovascular: Denies chest pain. Respiratory: Denies shortness of breath. Gastrointestinal: As above Genitourinary: Negative for dysuria. Musculoskeletal: Positive for myalgias Skin: Negative for rash. Neurological: Negative for headaches  10-point ROS otherwise negative.  ____________________________________________   PHYSICAL EXAM:  VITAL SIGNS: ED Triage Vitals [01/02/16 1802]  Enc Vitals Group      BP 104/71     Pulse Rate 81     Resp 18     Temp 98.1 F (36.7 C)     Temp Source Oral     SpO2 95 %     Weight 152 lb (68.9 kg)     Height 5\' 7"  (1.702 m)     Head Circumference      Peak Flow      Pain Score 9     Pain Loc      Pain Edu?      Excl. in [place]?     Constitutional: Alert and oriented. No acute distress.  Eyes: Conjunctivae are normal.  Head: Atraumatic. Nose: No congestion/rhinnorhea. Mouth/Throat: Mucous membranes are moist.    Cardiovascular: Normal rate, regular rhythm. Grossly normal heart sounds.  Good peripheral circulation. Respiratory: Normal respiratory effort.  No retractions. Lungs CTAB. Gastrointestinal: Soft and nontender. No distention.  No CVA tenderness. Genitourinary: deferred Musculoskeletal: No lower extremity tenderness nor edema.  Warm and well perfused Neurologic:  Normal speech and language. No gross focal neurologic deficits are appreciated.  Skin:  Skin is warm, dry and intact. No rash noted. Psychiatric: Mood and affect are normal. Speech and behavior are normal.  ____________________________________________   LABS (all labs ordered are listed, but only abnormal results are displayed)  Labs Reviewed  COMPREHENSIVE METABOLIC PANEL - Abnormal; Notable for the following:       Result Value   Glucose, Bld 114 (*)    All other components within normal limits  CBC - Abnormal; Notable for the following:    RDW 15.3 (*)    All other components within normal limits  LIPASE, BLOOD  URINALYSIS COMPLETEWITH MICROSCOPIC ([facility] ONLY)   ____________________________________________  EKG  None ____________________________________________  RADIOLOGY  None ____________________________________________   PROCEDURES  Procedure(s) performed: No    Critical Care performed: No ____________________________________________   INITIAL IMPRESSION / ASSESSMENT AND PLAN / ED COURSE  Pertinent labs & imaging results that were  available during my care of the patient were reviewed by me and considered in my medical decision making (see chart for details).  Patient well-appearing and in no distress. He reports nausea vomiting and diarrhea. Clinically is not dehydrated, normal vital signs, lab work is reassuring. Patient given IV fluids and IV Zofran and had significant improvement. We will treat with by mouth Zofran recommend continued focus on hydration. Return precautions discussed  Clinical Course   ____________________________________________   FINAL CLINICAL IMPRESSION(S) / ED DIAGNOSES  Final diagnoses:  Gastroenteritis      NEW MEDICATIONS STARTED DURING THIS VISIT:  Discharge Medication List as of 01/02/2016  8:47 PM    START taking these medications   Details  ondansetron (ZOFRAN) 4 MG tablet Take 1 tablet (4 mg total) by mouth daily as needed for nausea or vomiting., Starting Sun 01/02/2016, Print         Note:  This document was prepared using Dragon voice recognition software and may include unintentional dictation errors.    Lavonia Drafts, MD 01/02/16 2251

## 2016-01-04 ENCOUNTER — Telehealth: Payer: Self-pay | Admitting: Emergency Medicine

## 2016-01-04 NOTE — Telephone Encounter (Signed)
Pharmacy called to tell us that they will not fill rx for zofran due to black box warning.  Notified dr Clearnce Hasten.   Asked pharmacy to have pt follow up with pcp.

## 2016-01-24 ENCOUNTER — Encounter: Payer: Self-pay | Admitting: *Deleted

## 2016-01-24 DIAGNOSIS — E039 Hypothyroidism, unspecified: Secondary | ICD-10-CM | POA: Diagnosis not present

## 2016-01-24 DIAGNOSIS — F17210 Nicotine dependence, cigarettes, uncomplicated: Secondary | ICD-10-CM | POA: Diagnosis not present

## 2016-01-24 DIAGNOSIS — Z79899 Other long term (current) drug therapy: Secondary | ICD-10-CM | POA: Insufficient documentation

## 2016-01-24 DIAGNOSIS — J449 Chronic obstructive pulmonary disease, unspecified: Secondary | ICD-10-CM | POA: Diagnosis not present

## 2016-01-24 DIAGNOSIS — J189 Pneumonia, unspecified organism: Secondary | ICD-10-CM | POA: Insufficient documentation

## 2016-01-24 DIAGNOSIS — I10 Essential (primary) hypertension: Secondary | ICD-10-CM | POA: Insufficient documentation

## 2016-01-24 DIAGNOSIS — J45909 Unspecified asthma, uncomplicated: Secondary | ICD-10-CM | POA: Insufficient documentation

## 2016-01-24 DIAGNOSIS — R05 Cough: Secondary | ICD-10-CM | POA: Diagnosis present

## 2016-01-24 LAB — POCT RAPID STREP A: Streptococcus, Group A Screen (Direct): NEGATIVE

## 2016-01-24 MED ORDER — ACETAMINOPHEN 325 MG PO TABS
650.0000 mg | ORAL_TABLET | Freq: Once | ORAL | Status: AC | PRN
  Administered 2016-01-24: 650 mg via ORAL
  Filled 2016-01-24: qty 2

## 2016-01-24 NOTE — ED Triage Notes (Signed)
Pt c/o flu-like sxs starting on past Saturday. Pt denies sick contact w/ others. Pt states he walked here from home today. Pt states he is taking his regular home meds. Pt denies taking any OTC meds for his sxs.

## 2016-01-25 ENCOUNTER — Emergency Department: Payer: Medicaid Other

## 2016-01-25 ENCOUNTER — Emergency Department
Admission: EM | Admit: 2016-01-25 | Discharge: 2016-01-25 | Disposition: A | Discharge status: Home / Self Care | Payer: Medicaid Other | Attending: Emergency Medicine | Admitting: Emergency Medicine

## 2016-01-25 DIAGNOSIS — J189 Pneumonia, unspecified organism: Secondary | ICD-10-CM

## 2016-01-25 DIAGNOSIS — R05 Cough: Secondary | ICD-10-CM

## 2016-01-25 DIAGNOSIS — J181 Lobar pneumonia, unspecified organism: Secondary | ICD-10-CM

## 2016-01-25 DIAGNOSIS — R52 Pain, unspecified: Secondary | ICD-10-CM

## 2016-01-25 DIAGNOSIS — R059 Cough, unspecified: Secondary | ICD-10-CM

## 2016-01-25 LAB — CBC
HEMATOCRIT: 38.7 % — AB (ref 40.0–52.0)
HEMOGLOBIN: 12.9 g/dL — AB (ref 13.0–18.0)
MCH: 29.1 pg (ref 26.0–34.0)
MCHC: 33.4 g/dL (ref 32.0–36.0)
MCV: 87.2 fL (ref 80.0–100.0)
Platelets: 217 10*3/uL (ref 150–440)
RBC: 4.44 MIL/uL (ref 4.40–5.90)
RDW: 14.8 % — ABNORMAL HIGH (ref 11.5–14.5)
WBC: 11.8 10*3/uL — AB (ref 3.8–10.6)

## 2016-01-25 LAB — BLOOD GAS, VENOUS
ACID-BASE EXCESS: 0.1 mmol/L (ref 0.0–2.0)
BICARBONATE: 25.4 mmol/L (ref 20.0–28.0)
FIO2: 0.21
O2 Saturation: 75.1 %
PATIENT TEMPERATURE: 37
pCO2, Ven: 43 mmHg — ABNORMAL LOW (ref 44.0–60.0)
pH, Ven: 7.38 (ref 7.250–7.430)
pO2, Ven: 41 mmHg (ref 32.0–45.0)

## 2016-01-25 LAB — COMPREHENSIVE METABOLIC PANEL
ALBUMIN: 4 g/dL (ref 3.5–5.0)
ALK PHOS: 97 U/L (ref 38–126)
ALT: 18 U/L (ref 17–63)
AST: 18 U/L (ref 15–41)
Anion gap: 4 — ABNORMAL LOW (ref 5–15)
BILIRUBIN TOTAL: 0.5 mg/dL (ref 0.3–1.2)
BUN: 30 mg/dL — AB (ref 6–20)
CALCIUM: 9.2 mg/dL (ref 8.9–10.3)
CO2: 26 mmol/L (ref 22–32)
Chloride: 108 mmol/L (ref 101–111)
Creatinine, Ser: 1.04 mg/dL (ref 0.61–1.24)
GFR calc Af Amer: >60 mL/min (ref >60)
GFR calc non Af Amer: >60 mL/min (ref >60)
GLUCOSE: 113 mg/dL — AB (ref 65–99)
Potassium: 4 mmol/L (ref 3.5–5.1)
Sodium: 138 mmol/L (ref 135–145)
TOTAL PROTEIN: 7.2 g/dL (ref 6.5–8.1)

## 2016-01-25 MED ORDER — AZITHROMYCIN 500 MG PO TABS
500.0000 mg | ORAL_TABLET | Freq: Once | ORAL | Status: AC
  Administered 2016-01-25: 500 mg via ORAL
  Filled 2016-01-25: qty 1

## 2016-01-25 MED ORDER — BENZONATATE 100 MG PO CAPS: 100.0000 mg | ORAL_CAPSULE | Freq: Four times a day (QID) | ORAL | 0 refills | Status: DC | PRN

## 2016-01-25 MED ORDER — BENZONATATE 100 MG PO CAPS
100.0000 mg | ORAL_CAPSULE | Freq: Once | ORAL | Status: AC
  Administered 2016-01-25: 100 mg via ORAL
  Filled 2016-01-25 (×2): qty 1

## 2016-01-25 MED ORDER — KETOROLAC TROMETHAMINE 30 MG/ML IJ SOLN
30.0000 mg | Freq: Once | INTRAMUSCULAR | Status: AC
  Administered 2016-01-25: 30 mg via INTRAVENOUS
  Filled 2016-01-25: qty 1

## 2016-01-25 MED ORDER — IPRATROPIUM-ALBUTEROL 0.5-2.5 (3) MG/3ML IN SOLN
3.0000 mL | Freq: Once | RESPIRATORY_TRACT | Status: AC
  Administered 2016-01-25: 3 mL via RESPIRATORY_TRACT
  Filled 2016-01-25: qty 3

## 2016-01-25 MED ORDER — IBUPROFEN 800 MG PO TABS
800.0000 mg | ORAL_TABLET | Freq: Three times a day (TID) | ORAL | 0 refills | Status: DC | PRN
Start: 2016-01-25 — End: 2017-07-23

## 2016-01-25 MED ORDER — AZITHROMYCIN 250 MG PO TABS: ORAL_TABLET | ORAL | 0 refills | Status: AC

## 2016-01-25 NOTE — ED Provider Notes (Signed)
[facility] Emergency Department Provider Note   ____________________________________________   First MD Initiated Contact with Patient 01/25/16 0225     (approximate)  I have reviewed the triage vital signs and the nursing notes.   HISTORY  Chief Complaint Influenza    HPI Christopher Mason is a 55 y.o. male who comes into the hospital today feeling unwell. The patient reports that he has a sore throat and he is unable to talk. He has been having hot flashes and sweats at home. He reports that he has not felt well in the last 3 days. His nose is stuffy and he is coughing up some greenish yellow stuff. The patient endorses some fevers at home but has not taken his temperature. He reports he does not know how to use a thermometer. The patient denies any sick contacts and has not taken any medication for his symptoms. He reports he has nothing at home. He has some shortness of breath and body aches and rates his pain a 10 out of 10 in intensity. The patient is here today for evaluation of the symptoms.   Past Medical History:  Diagnosis Date  . Asthma   . COPD (chronic obstructive pulmonary disease) ([place])   . Depression   . GERD (gastroesophageal reflux disease)   . HLD (hyperlipidemia)   . Hypertension   . Hypothyroid   . Irritable bowel syndrome (IBS)     Patient Active Problem List   Diagnosis Date Noted  . GI bleeding 09/05/2015  . HLD (hyperlipidemia) 02/07/2015  . COPD (chronic obstructive pulmonary disease) ([place]) 02/07/2015  . Hyponatremia 02/07/2015  . Gastritis 02/07/2015  . Acute kidney injury ([place]) 11/20/2014  . ARF (acute renal failure) ([place]) 11/06/2014  . Hypokalemia 11/06/2014  . Hypotension 11/06/2014  . Cannabis use disorder, severe, dependence ([place]) 09/24/2014  . Undifferentiated schizophrenia ([place])   . Hypothyroidism 09/17/2014  . GERD (gastroesophageal reflux disease) 09/17/2014  . Tobacco use disorder 09/17/2014  . Non  compliance w medication regimen 09/16/2014  . Asthma 09/16/2014  . HTN (hypertension) 09/16/2014  . Tardive dyskinesia 09/16/2014    Past Surgical History:  Procedure Laterality Date  . BACK SURGERY    . CARPAL TUNNEL RELEASE Bilateral   . ESOPHAGOGASTRODUODENOSCOPY (EGD) WITH PROPOFOL N/A 09/06/2015   Procedure: ESOPHAGOGASTRODUODENOSCOPY (EGD) WITH PROPOFOL;  Surgeon: Hulen Luster, MD;  Location: [facility] ENDOSCOPY;  Service: Endoscopy;  Laterality: N/A;  . ROTATOR CUFF REPAIR     2007    Prior to Admission medications   Medication Sig Start Date End Date Taking? Authorizing Provider  albuterol (PROVENTIL HFA;VENTOLIN HFA) 108 (90 BASE) MCG/ACT inhaler Inhale 2 puffs into the lungs every 4 (four) hours as needed for wheezing or shortness of breath. 09/25/14   Clovis Fredrickson, MD  azithromycin (ZITHROMAX Z-PAK) 250 MG tablet Take 2 tablets (500 mg) on  Day 1,  followed by 1 tablet (250 mg) once daily on Days 2 through 5. 01/25/16 01/30/16  Loney Hering, MD  benzonatate (TESSALON PERLES) 100 MG capsule Take 1 capsule (100 mg total) by mouth every 6 (six) hours as needed for cough. 01/25/16   Loney Hering, MD  hydrOXYzine (ATARAX/VISTARIL) 50 MG tablet Take 50 mg by mouth 3 (three) times daily as needed.    Historical Provider, MD  ibuprofen (ADVIL,MOTRIN) 800 MG tablet Take 1 tablet (800 mg total) by mouth every 8 (eight) hours as needed. 01/25/16   Loney Hering, MD  omeprazole (PRILOSEC) 20 MG  capsule Take 1 capsule (20 mg total) by mouth 2 (two) times daily before a meal. 09/06/15   Loletha Grayer, MD  ondansetron (ZOFRAN) 4 MG tablet Take 1 tablet (4 mg total) by mouth daily as needed for nausea or vomiting. 01/02/16   Lavonia Drafts, MD  QUEtiapine (SEROQUEL) 300 MG tablet Take 1 tablet (300 mg total) by mouth at bedtime. 09/29/14   Clovis Fredrickson, MD  venlafaxine (EFFEXOR) 50 MG tablet Take 50 mg by mouth 2 (two) times daily.    Historical Provider, MD  zolpidem (AMBIEN) 5 MG  tablet Take 5 mg by mouth at bedtime as needed for sleep.    Historical Provider, MD    Allergies Aspirin  Family History  Problem Relation Age of Onset  . Hypertension Father   . Cataracts Father   . Diabetes Sister   . Diabetes Brother   . Dementia Mother     Social History Social History  Substance Use Topics  . Smoking status: Current Every Day Smoker    Packs/day: 1.00    Types: Cigarettes  . Smokeless tobacco: Never Used  . Alcohol use No    Review of Systems Constitutional: No fever/chills Eyes: No visual changes. ENT: No sore throat. Cardiovascular: Denies chest pain. Respiratory: Denies shortness of breath. Gastrointestinal: No abdominal pain.  No nausea, no vomiting.  No diarrhea.  No constipation. Genitourinary: Negative for dysuria. Musculoskeletal: Negative for back pain. Skin: Negative for rash. Neurological: Negative for headaches, focal weakness or numbness.  10-point ROS otherwise negative.  ____________________________________________   PHYSICAL EXAM:  VITAL SIGNS: ED Triage Vitals  Enc Vitals Group     BP 01/24/16 2335 136/89     Pulse Rate 01/24/16 2335 94     Resp 01/24/16 2335 20     Temp 01/24/16 2335 98.5 F (36.9 C)     Temp Source 01/24/16 2335 Oral     SpO2 01/24/16 2335 97 %     Weight 01/24/16 2335 152 lb (68.9 kg)     Height 01/24/16 2335 5\' 7"  (1.702 m)     Head Circumference --      Peak Flow --      Pain Score 01/24/16 2336 10     Pain Loc --      Pain Edu? --      Excl. in [place]? --     Constitutional: Alert and oriented. Well appearing and in no acute distress. Eyes: Conjunctivae are normal. PERRL. EOMI. Head: Atraumatic. Nose: No congestion/rhinnorhea. Mouth/Throat: Mucous membranes are moist.  Oropharynx non-erythematous. Cardiovascular: Normal rate, regular rhythm. Grossly normal heart sounds.  Good peripheral circulation. Respiratory: Normal respiratory effort.  No retractions. Lungs CTAB. Gastrointestinal:  Soft and nontender. No distention. positive bowel sounds Musculoskeletal: No lower extremity tenderness nor edema.   Neurologic:  Normal speech and language.  Skin:  Skin is warm, dry and intact.  Psychiatric: Mood and affect are normal.   ____________________________________________   LABS (all labs ordered are listed, but only abnormal results are displayed)  Labs Reviewed  CBC - Abnormal; Notable for the following:       Result Value   WBC 11.8 (*)    Hemoglobin 12.9 (*)    HCT 38.7 (*)    RDW 14.8 (*)    All other components within normal limits  COMPREHENSIVE METABOLIC PANEL - Abnormal; Notable for the following:    Glucose, Bld 113 (*)    BUN 30 (*)    Anion gap 4 (*)  All other components within normal limits  BLOOD GAS, VENOUS - Abnormal; Notable for the following:    pCO2, Ven 43 (*)    All other components within normal limits  POCT RAPID STREP A   ____________________________________________  EKG  none ____________________________________________  RADIOLOGY  CXR ____________________________________________   PROCEDURES  Procedure(s) performed: None  Procedures  Critical Care performed: No  ____________________________________________   INITIAL IMPRESSION / ASSESSMENT AND PLAN / ED COURSE  Pertinent labs & imaging results that were available during my care of the patient were reviewed by me and considered in my medical decision making (see chart for details).  This is a 55 year old male who comes into the hospital today not feeling well. He has some fevers at home, body aches, sore throat with cough and some shortness of breath. The patient feels as though he has the flu. He's had these symptoms for 3 days. I will check some blood work and give the patient is shot of Toradol as well as a DuoNeb treatment. I will check a chest x-ray and reevaluate the patient.  Clinical Course  Value Comment By Time  DG Chest 2 View Shallow inspiration with  linear atelectasis in the lung bases. Suggestion of early infiltration in the left lower lung.   Loney Hering, MD 10/03 [phone]   The patient is laying on the stretcher and he feels improved. His vital signs remain unremarkable. His chest x-ray does show a possible early infiltration his left lower lung. The patient does have a white count of 7.8. Given the patient's history of COPD I will treat him with some azithromycin. The patient also received a benzonatate for his cough. The patient be discharged home to follow-up. He has no further complaints or concerns at this time.  ____________________________________________   FINAL CLINICAL IMPRESSION(S) / ED DIAGNOSES  Final diagnoses:  Community acquired pneumonia of left lower lobe of lung ([place])  Body aches  Cough      NEW MEDICATIONS STARTED DURING THIS VISIT:  New Prescriptions   AZITHROMYCIN (ZITHROMAX Z-PAK) 250 MG TABLET    Take 2 tablets (500 mg) on  Day 1,  followed by 1 tablet (250 mg) once daily on Days 2 through 5.   BENZONATATE (TESSALON PERLES) 100 MG CAPSULE    Take 1 capsule (100 mg total) by mouth every 6 (six) hours as needed for cough.   IBUPROFEN (ADVIL,MOTRIN) 800 MG TABLET    Take 1 tablet (800 mg total) by mouth every 8 (eight) hours as needed.     Note:  This document was prepared using Dragon voice recognition software and may include unintentional dictation errors.    Loney Hering, MD 01/25/16 [phone]

## 2016-01-25 NOTE — ED Notes (Signed)
Report off to sarah rn  

## 2016-01-25 NOTE — ED Notes (Signed)
Discharge instructions reviewed with patient. Patient verbalized understanding. Patient ambulated to lobby without difficulty.   

## 2016-01-25 NOTE — ED Notes (Signed)
Pt has body aches, sore throat for 3 days.  Pt also has productive cough.  States yellow and green phlegm.  cig smoker.  Pt alert.

## 2016-01-26 ENCOUNTER — Encounter: Payer: Self-pay | Admitting: *Deleted

## 2016-01-27 ENCOUNTER — Encounter: Admission: RE | Payer: Self-pay | Source: Ambulatory Visit

## 2016-01-27 ENCOUNTER — Ambulatory Visit
Admission: RE | Admit: 2016-01-27 | Payer: Medicaid Other | Source: Ambulatory Visit | Admitting: Unknown Physician Specialty

## 2016-01-27 HISTORY — DX: Schizophrenia, unspecified: F20.9

## 2016-01-27 HISTORY — DX: Bipolar disorder, unspecified: F31.9

## 2016-01-27 SURGERY — COLONOSCOPY WITH PROPOFOL
Anesthesia: General

## 2016-04-25 ENCOUNTER — Emergency Department
Admission: EM | Admit: 2016-04-25 | Discharge: 2016-04-25 | Disposition: A | Discharge status: Home / Self Care | Payer: Medicaid Other | Attending: Emergency Medicine | Admitting: Emergency Medicine

## 2016-04-25 ENCOUNTER — Encounter: Payer: Self-pay | Admitting: Emergency Medicine

## 2016-04-25 DIAGNOSIS — R197 Diarrhea, unspecified: Secondary | ICD-10-CM | POA: Diagnosis not present

## 2016-04-25 DIAGNOSIS — E039 Hypothyroidism, unspecified: Secondary | ICD-10-CM | POA: Diagnosis not present

## 2016-04-25 DIAGNOSIS — J45909 Unspecified asthma, uncomplicated: Secondary | ICD-10-CM | POA: Insufficient documentation

## 2016-04-25 DIAGNOSIS — J449 Chronic obstructive pulmonary disease, unspecified: Secondary | ICD-10-CM | POA: Diagnosis not present

## 2016-04-25 DIAGNOSIS — R112 Nausea with vomiting, unspecified: Secondary | ICD-10-CM | POA: Insufficient documentation

## 2016-04-25 DIAGNOSIS — Z79899 Other long term (current) drug therapy: Secondary | ICD-10-CM | POA: Diagnosis not present

## 2016-04-25 DIAGNOSIS — F17210 Nicotine dependence, cigarettes, uncomplicated: Secondary | ICD-10-CM | POA: Diagnosis not present

## 2016-04-25 LAB — COMPREHENSIVE METABOLIC PANEL
ALBUMIN: 4.4 g/dL (ref 3.5–5.0)
ALT: 21 U/L (ref 17–63)
AST: 23 U/L (ref 15–41)
Alkaline Phosphatase: 116 U/L (ref 38–126)
Anion gap: 5 (ref 5–15)
BUN: 18 mg/dL (ref 6–20)
CHLORIDE: 108 mmol/L (ref 101–111)
CO2: 25 mmol/L (ref 22–32)
Calcium: 9.4 mg/dL (ref 8.9–10.3)
Creatinine, Ser: 0.71 mg/dL (ref 0.61–1.24)
GFR calc Af Amer: >60 mL/min (ref >60)
GFR calc non Af Amer: >60 mL/min (ref >60)
GLUCOSE: 119 mg/dL — AB (ref 65–99)
POTASSIUM: 3.6 mmol/L (ref 3.5–5.1)
SODIUM: 138 mmol/L (ref 135–145)
Total Bilirubin: 0.5 mg/dL (ref 0.3–1.2)
Total Protein: 7.9 g/dL (ref 6.5–8.1)

## 2016-04-25 LAB — CBC
HCT: 42.8 % (ref 40.0–52.0)
Hemoglobin: 14.6 g/dL (ref 13.0–18.0)
MCH: 29.3 pg (ref 26.0–34.0)
MCHC: 34.2 g/dL (ref 32.0–36.0)
MCV: 85.7 fL (ref 80.0–100.0)
PLATELETS: 209 10*3/uL (ref 150–440)
RBC: 4.99 MIL/uL (ref 4.40–5.90)
RDW: 16 % — AB (ref 11.5–14.5)
WBC: 12 10*3/uL — AB (ref 3.8–10.6)

## 2016-04-25 LAB — LIPASE, BLOOD: Lipase: 24 U/L (ref 11–51)

## 2016-04-25 MED ORDER — ONDANSETRON HCL 4 MG PO TABS: 4.0000 mg | ORAL_TABLET | Freq: Every day | ORAL | 1 refills | Status: DC | PRN

## 2016-04-25 MED ORDER — ONDANSETRON HCL 4 MG/2ML IJ SOLN
4.0000 mg | Freq: Once | INTRAMUSCULAR | Status: AC
  Administered 2016-04-25: 4 mg via INTRAVENOUS
  Filled 2016-04-25: qty 2

## 2016-04-25 MED ORDER — SODIUM CHLORIDE 0.9 % IV SOLN
1000.0000 mL | Freq: Once | INTRAVENOUS | Status: AC
  Administered 2016-04-25: 1000 mL via INTRAVENOUS

## 2016-04-25 NOTE — ED Provider Notes (Signed)
[facility] Emergency Department Provider Note   ____________________________________________    I have reviewed the triage vital signs and the nursing notes.   HISTORY  Chief Complaint Emesis     HPI Christopher Mason is a 56 y.o. male who presents with complaints of nausea vomiting and diarrhea. Patient reports the symptoms started overnight. He reports multiple episodes of nausea and vomiting. Complains of diffuse abdominal cramping. He denies fevers or chills. No sick contacts reported.   Past Medical History:  Diagnosis Date  . Asthma   . Bipolar disorder ([place])   . COPD (chronic obstructive pulmonary disease) ([place])   . Depression   . GERD (gastroesophageal reflux disease)   . HLD (hyperlipidemia)   . Hypertension   . Hypothyroid   . Irritable bowel syndrome (IBS)   . Schizophrenia [facility]     Patient Active Problem List   Diagnosis Date Noted  . GI bleeding 09/05/2015  . HLD (hyperlipidemia) 02/07/2015  . COPD (chronic obstructive pulmonary disease) ([place]) 02/07/2015  . Hyponatremia 02/07/2015  . Gastritis 02/07/2015  . Acute kidney injury ([place]) 11/20/2014  . ARF (acute renal failure) ([place]) 11/06/2014  . Hypokalemia 11/06/2014  . Hypotension 11/06/2014  . Cannabis use disorder, severe, dependence ([place]) 09/24/2014  . Undifferentiated schizophrenia ([place])   . Hypothyroidism 09/17/2014  . GERD (gastroesophageal reflux disease) 09/17/2014  . Tobacco use disorder 09/17/2014  . Non compliance w medication regimen 09/16/2014  . Asthma 09/16/2014  . HTN (hypertension) 09/16/2014  . Tardive dyskinesia 09/16/2014    Past Surgical History:  Procedure Laterality Date  . BACK SURGERY    . CARPAL TUNNEL RELEASE Bilateral   . ESOPHAGOGASTRODUODENOSCOPY (EGD) WITH PROPOFOL N/A 09/06/2015   Procedure: ESOPHAGOGASTRODUODENOSCOPY (EGD) WITH PROPOFOL;  Surgeon: Hulen Luster, MD;  Location: [facility] ENDOSCOPY;  Service: Endoscopy;  Laterality: N/A;  . ROTATOR  CUFF REPAIR     2007    Prior to Admission medications   Medication Sig Start Date End Date Taking? Authorizing Provider  albuterol (PROVENTIL HFA;VENTOLIN HFA) 108 (90 BASE) MCG/ACT inhaler Inhale 2 puffs into the lungs every 4 (four) hours as needed for wheezing or shortness of breath. 09/25/14   Clovis Fredrickson, MD  benzonatate (TESSALON PERLES) 100 MG capsule Take 1 capsule (100 mg total) by mouth every 6 (six) hours as needed for cough. 01/25/16   Loney Hering, MD  hydrOXYzine (ATARAX/VISTARIL) 50 MG tablet Take 50 mg by mouth 3 (three) times daily as needed.    Historical Provider, MD  ibuprofen (ADVIL,MOTRIN) 800 MG tablet Take 1 tablet (800 mg total) by mouth every 8 (eight) hours as needed. 01/25/16   Loney Hering, MD  omeprazole (PRILOSEC) 20 MG capsule Take 1 capsule (20 mg total) by mouth 2 (two) times daily before a meal. 09/06/15   Loletha Grayer, MD  ondansetron (ZOFRAN) 4 MG tablet Take 1 tablet (4 mg total) by mouth daily as needed for nausea or vomiting. 04/25/16   Lavonia Drafts, MD  QUEtiapine (SEROQUEL) 300 MG tablet Take 1 tablet (300 mg total) by mouth at bedtime. 09/29/14   Clovis Fredrickson, MD  venlafaxine (EFFEXOR) 50 MG tablet Take 50 mg by mouth 2 (two) times daily.    Historical Provider, MD  zolpidem (AMBIEN) 5 MG tablet Take 5 mg by mouth at bedtime as needed for sleep.    Historical Provider, MD     Allergies Aspirin  Family History  Problem Relation Age of Onset  . Hypertension Father   .  Cataracts Father   . Diabetes Sister   . Diabetes Brother   . Dementia Mother     Social History Social History  Substance Use Topics  . Smoking status: Current Every Day Smoker    Packs/day: 1.00    Types: Cigarettes  . Smokeless tobacco: Never Used  . Alcohol use No    Review of Systems  Constitutional: No fever/chills Eyes: No visual changes.   Cardiovascular: Denies chest pain. Respiratory: Denies shortness of breath. Gastrointestinal: As  above  Genitourinary: Negative for dysuria. Musculoskeletal: Negative for myalgias Skin: Negative for rash. Neurological: Negative for headaches  10-point ROS otherwise negative.  ____________________________________________   PHYSICAL EXAM:  VITAL SIGNS: ED Triage Vitals  Enc Vitals Group     BP --      Pulse Rate 04/25/16 0934 88     Resp 04/25/16 0934 19     Temp 04/25/16 0934 98.1 F (36.7 C)     Temp Source 04/25/16 0934 Oral     SpO2 04/25/16 0934 96 %     Weight 04/25/16 0933 151 lb (68.5 kg)     Height 04/25/16 0933 5\' 7"  (1.702 m)     Head Circumference --      Peak Flow --      Pain Score 04/25/16 0933 10     Pain Loc --      Pain Edu? --      Excl. in [place]? --     Constitutional: No acute distress.Somewhat anxious Eyes: Conjunctivae are normal.  Head: Atraumatic. Nose: No congestion/rhinnorhea. Mouth/Throat: Mucous membranes are moist.    Cardiovascular: Normal rate, regular rhythm. Grossly normal heart sounds.  Good peripheral circulation. Respiratory: Normal respiratory effort.  No retractions. Lungs CTAB. Gastrointestinal: Soft and nontender. No distention.  No CVA tenderness. Genitourinary: deferred Musculoskeletal:  Warm and well perfused Neurologic:  Normal speech and language. No gross focal neurologic deficits are appreciated.  Skin:  Skin is warm, dry and intact. No rash noted. Psychiatric: Speech and behavior are normal.  ____________________________________________   LABS (all labs ordered are listed, but only abnormal results are displayed)  Labs Reviewed  CBC - Abnormal; Notable for the following:       Result Value   WBC 12.0 (*)    RDW 16.0 (*)    All other components within normal limits  COMPREHENSIVE METABOLIC PANEL - Abnormal; Notable for the following:    Glucose, Bld 119 (*)    All other components within normal limits  LIPASE, BLOOD    ____________________________________________  EKG  None ____________________________________________  RADIOLOGY  None ____________________________________________   PROCEDURES  Procedure(s) performed: No    Critical Care performed: No ____________________________________________   INITIAL IMPRESSION / ASSESSMENT AND PLAN / ED COURSE  Pertinent labs & imaging results that were available during my care of the patient were reviewed by me and considered in my medical decision making (see chart for details).  Patient resents with nausea vomiting and diarrhea. I suspect viral gastroenteritis which is common in the community at this time. We will check labs, give IV fluids and IV Zofran and reevaluate. Abdominal exam is very reassuring  Clinical Course   ----------------------------------------- 12:30 PM on 04/25/2016 -----------------------------------------  Patient reports feeling much better, he can use to have some diarrhea but otherwise reports his nausea and abdominal pain is resolved. Recommend supportive care. Return precautions discussed ____________________________________________   FINAL CLINICAL IMPRESSION(S) / ED DIAGNOSES  Final diagnoses:  Nausea vomiting and diarrhea  NEW MEDICATIONS STARTED DURING THIS VISIT:  New Prescriptions   ONDANSETRON (ZOFRAN) 4 MG TABLET    Take 1 tablet (4 mg total) by mouth daily as needed for nausea or vomiting.     Note:  This document was prepared using Dragon voice recognition software and may include unintentional dictation errors.    Lavonia Drafts, MD 04/25/16 1230

## 2016-04-25 NOTE — ED Notes (Signed)
Incontinent moderate amount of loose/ liquid stool.  Patient cleaned and changed.  Tolerated well.  Continue to monitor.

## 2016-04-25 NOTE — ED Notes (Signed)
AAOx3.  Skin warm and dry.  Ambulates with easy and steady gait. NAD 

## 2016-04-25 NOTE — ED Triage Notes (Signed)
Pt presents to ED via [facility] from home c/o n/v since 0400 this morning. Hx IBS, HTN, COPD.

## 2016-04-25 NOTE — ED Notes (Signed)
Patient working on getting a ride home from ED.

## 2016-05-31 ENCOUNTER — Emergency Department: Payer: Medicaid Other

## 2016-05-31 ENCOUNTER — Encounter: Payer: Self-pay | Admitting: Emergency Medicine

## 2016-05-31 DIAGNOSIS — J449 Chronic obstructive pulmonary disease, unspecified: Secondary | ICD-10-CM | POA: Diagnosis not present

## 2016-05-31 DIAGNOSIS — E039 Hypothyroidism, unspecified: Secondary | ICD-10-CM | POA: Diagnosis not present

## 2016-05-31 DIAGNOSIS — M25562 Pain in left knee: Secondary | ICD-10-CM | POA: Insufficient documentation

## 2016-05-31 DIAGNOSIS — Z79899 Other long term (current) drug therapy: Secondary | ICD-10-CM | POA: Diagnosis not present

## 2016-05-31 DIAGNOSIS — J45909 Unspecified asthma, uncomplicated: Secondary | ICD-10-CM | POA: Insufficient documentation

## 2016-05-31 DIAGNOSIS — I10 Essential (primary) hypertension: Secondary | ICD-10-CM | POA: Insufficient documentation

## 2016-05-31 DIAGNOSIS — F17210 Nicotine dependence, cigarettes, uncomplicated: Secondary | ICD-10-CM | POA: Insufficient documentation

## 2016-05-31 NOTE — ED Triage Notes (Signed)
Pt to triage via [place], reports pain to left knee starting tonight.  Hx of knee replacement.  Pt reports ambulatory but with intense pain.  Pt w/ slurred speech in triage, pt states  It's due to his night medication

## 2016-06-01 ENCOUNTER — Emergency Department
Admission: EM | Admit: 2016-06-01 | Discharge: 2016-06-01 | Disposition: A | Discharge status: Home / Self Care | Payer: Medicaid Other | Attending: Emergency Medicine | Admitting: Emergency Medicine

## 2016-06-01 DIAGNOSIS — M25569 Pain in unspecified knee: Secondary | ICD-10-CM

## 2016-06-01 MED ORDER — KETOROLAC TROMETHAMINE 60 MG/2ML IM SOLN
60.0000 mg | Freq: Once | INTRAMUSCULAR | Status: AC
  Administered 2016-06-01: 60 mg via INTRAMUSCULAR
  Filled 2016-06-01: qty 2

## 2016-06-01 MED ORDER — DICLOFENAC SODIUM 1 % TD GEL: 2.0000 g | Freq: Three times a day (TID) | TRANSDERMAL | 0 refills | Status: DC

## 2016-06-01 NOTE — Discharge Instructions (Signed)
Please seek medical attention for any high fevers, chest pain, shortness of breath, change in behavior, persistent vomiting, bloody stool or any other new or concerning symptoms.  

## 2016-06-01 NOTE — ED Provider Notes (Signed)
[facility] Emergency Department Provider Note   ____________________________________________   I have reviewed the triage vital signs and the nursing notes.   HISTORY  Chief Complaint Knee Pain   History limited by: Not Limited   HPI Christopher Mason is a 56 y.o. male who presents to the emergency department today because of left knee pain. He states it started today. It is located on the outside part of his knee. It is severe. Has been constant. The patient states he did try some ibuprofen without significant relief. Patient denies any trauma to the knee. He denies twisting his knee. Patient denies any numbness or tingling going down his leg. Denies any fevers, nausea or vomiting.   Past Medical History:  Diagnosis Date  . Asthma   . Bipolar disorder ([place])   . COPD (chronic obstructive pulmonary disease) ([place])   . Depression   . GERD (gastroesophageal reflux disease)   . HLD (hyperlipidemia)   . Hypertension   . Hypothyroid   . Irritable bowel syndrome (IBS)   . Schizophrenia [facility])     Patient Active Problem List   Diagnosis Date Noted  . GI bleeding 09/05/2015  . HLD (hyperlipidemia) 02/07/2015  . COPD (chronic obstructive pulmonary disease) ([place]) 02/07/2015  . Hyponatremia 02/07/2015  . Gastritis 02/07/2015  . Acute kidney injury ([place]) 11/20/2014  . ARF (acute renal failure) ([place]) 11/06/2014  . Hypokalemia 11/06/2014  . Hypotension 11/06/2014  . Cannabis use disorder, severe, dependence ([place]) 09/24/2014  . Undifferentiated schizophrenia ([place])   . Hypothyroidism 09/17/2014  . GERD (gastroesophageal reflux disease) 09/17/2014  . Tobacco use disorder 09/17/2014  . Non compliance w medication regimen 09/16/2014  . Asthma 09/16/2014  . HTN (hypertension) 09/16/2014  . Tardive dyskinesia 09/16/2014    Past Surgical History:  Procedure Laterality Date  . BACK SURGERY    . CARPAL TUNNEL RELEASE Bilateral   . ESOPHAGOGASTRODUODENOSCOPY (EGD)  WITH PROPOFOL N/A 09/06/2015   Procedure: ESOPHAGOGASTRODUODENOSCOPY (EGD) WITH PROPOFOL;  Surgeon: Hulen Luster, MD;  Location: [facility] ENDOSCOPY;  Service: Endoscopy;  Laterality: N/A;  . ROTATOR CUFF REPAIR     2007    Prior to Admission medications   Medication Sig Start Date End Date Taking? Authorizing Provider  albuterol (PROVENTIL HFA;VENTOLIN HFA) 108 (90 BASE) MCG/ACT inhaler Inhale 2 puffs into the lungs every 4 (four) hours as needed for wheezing or shortness of breath. 09/25/14   Clovis Fredrickson, MD  benzonatate (TESSALON PERLES) 100 MG capsule Take 1 capsule (100 mg total) by mouth every 6 (six) hours as needed for cough. 01/25/16   Loney Hering, MD  hydrOXYzine (ATARAX/VISTARIL) 50 MG tablet Take 50 mg by mouth 3 (three) times daily as needed.    Historical Provider, MD  ibuprofen (ADVIL,MOTRIN) 800 MG tablet Take 1 tablet (800 mg total) by mouth every 8 (eight) hours as needed. 01/25/16   Loney Hering, MD  omeprazole (PRILOSEC) 20 MG capsule Take 1 capsule (20 mg total) by mouth 2 (two) times daily before a meal. 09/06/15   Loletha Grayer, MD  ondansetron (ZOFRAN) 4 MG tablet Take 1 tablet (4 mg total) by mouth daily as needed for nausea or vomiting. 04/25/16   Lavonia Drafts, MD  QUEtiapine (SEROQUEL) 300 MG tablet Take 1 tablet (300 mg total) by mouth at bedtime. 09/29/14   Clovis Fredrickson, MD  venlafaxine (EFFEXOR) 50 MG tablet Take 50 mg by mouth 2 (two) times daily.    Historical Provider, MD  zolpidem (AMBIEN) 5  MG tablet Take 5 mg by mouth at bedtime as needed for sleep.    Historical Provider, MD    Allergies Aspirin  Family History  Problem Relation Age of Onset  . Hypertension Father   . Cataracts Father   . Diabetes Sister   . Diabetes Brother   . Dementia Mother     Social History Social History  Substance Use Topics  . Smoking status: Current Every Day Smoker    Packs/day: 1.00    Types: Cigarettes  . Smokeless tobacco: Never Used  . Alcohol use  No    Review of Systems  Constitutional: Negative for fever. Cardiovascular: Negative for chest pain. Respiratory: Negative for shortness of breath. Gastrointestinal: Negative for abdominal pain, vomiting and diarrhea. Musculoskeletal: Positive for left knee pain. Neurological: Negative for headaches, focal weakness or numbness.  10-point ROS otherwise negative.  ____________________________________________   PHYSICAL EXAM:  VITAL SIGNS: ED Triage Vitals [05/31/16 2340]  Enc Vitals Group     BP (!) 134/94     Pulse Rate 90     Resp 20     Temp 98 F (36.7 C)     Temp Source Oral     SpO2 95 %     Weight 150 lb (68 kg)     Height 5\' 7"  (1.702 m)     Head Circumference      Peak Flow      Pain Score 9   Constitutional: Alert and oriented. Well appearing and in no distress. Eyes: Conjunctivae are normal. Normal extraocular movements. ENT   Head: Normocephalic and atraumatic.   Nose: No congestion/rhinnorhea.   Mouth/Throat: Mucous membranes are moist.   Neck: No stridor. Hematological/Lymphatic/Immunilogical: No cervical lymphadenopathy. Cardiovascular: Normal rate, regular rhythm.  No murmurs, rubs, or gallops. Respiratory: Normal respiratory effort without tachypnea nor retractions. Breath sounds are clear and equal bilaterally. No wheezes/rales/rhonchi. Gastrointestinal: Soft and non tender. No rebound. No guarding.  Genitourinary: Deferred Musculoskeletal: No swelling of the left knee. No erythema. No warmth. No tenderness with passive range of motion. Old incision scar.  Neurologic:  Normal speech and language. No gross focal neurologic deficits are appreciated.  Skin:  Skin is warm, dry and intact. No rash noted. Psychiatric: Mood and affect are normal. Speech and behavior are normal. Patient exhibits appropriate insight and judgment.  ____________________________________________    LABS (pertinent  positives/negatives)  None  ____________________________________________   EKG  None  ____________________________________________    RADIOLOGY  Left knee x-ray IMPRESSION: Status post left knee replacement. No acute osseous abnormality.  ____________________________________________   PROCEDURES  Procedures  ____________________________________________   INITIAL IMPRESSION / ASSESSMENT AND PLAN / ED COURSE  Pertinent labs & imaging results that were available during my care of the patient were reviewed by me and considered in my medical decision making (see chart for details).  Patient presented to the emergency department today with concerns for left knee pain. X-rays without any acute findings. Physical exam without any effusion, erythema or warmth. At this point I doubt septic joint. It does not sound like patient continued to follow-up with his orthopedic doctor who performed the surgery. Will give him local orthopedic follow-up.  ____________________________________________   FINAL CLINICAL IMPRESSION(S) / ED DIAGNOSES  Final diagnoses:  Knee pain, unspecified chronicity, unspecified laterality     Note: This dictation was prepared with Dragon dictation. Any transcriptional errors that result from this process are unintentional     Nance Pear, MD 06/01/16 [phone]

## 2016-11-19 ENCOUNTER — Emergency Department
Admission: EM | Admit: 2016-11-19 | Discharge: 2016-11-19 | Disposition: A | Discharge status: Home / Self Care | Payer: Medicaid Other | Attending: Emergency Medicine | Admitting: Emergency Medicine

## 2016-11-19 ENCOUNTER — Emergency Department: Payer: Medicaid Other

## 2016-11-19 ENCOUNTER — Encounter: Payer: Self-pay | Admitting: Emergency Medicine

## 2016-11-19 DIAGNOSIS — F17210 Nicotine dependence, cigarettes, uncomplicated: Secondary | ICD-10-CM | POA: Diagnosis not present

## 2016-11-19 DIAGNOSIS — E039 Hypothyroidism, unspecified: Secondary | ICD-10-CM | POA: Insufficient documentation

## 2016-11-19 DIAGNOSIS — M62838 Other muscle spasm: Secondary | ICD-10-CM | POA: Diagnosis not present

## 2016-11-19 DIAGNOSIS — M25511 Pain in right shoulder: Secondary | ICD-10-CM | POA: Diagnosis present

## 2016-11-19 DIAGNOSIS — J45909 Unspecified asthma, uncomplicated: Secondary | ICD-10-CM | POA: Insufficient documentation

## 2016-11-19 DIAGNOSIS — Z79899 Other long term (current) drug therapy: Secondary | ICD-10-CM | POA: Diagnosis not present

## 2016-11-19 DIAGNOSIS — I10 Essential (primary) hypertension: Secondary | ICD-10-CM | POA: Insufficient documentation

## 2016-11-19 DIAGNOSIS — J449 Chronic obstructive pulmonary disease, unspecified: Secondary | ICD-10-CM | POA: Diagnosis not present

## 2016-11-19 MED ORDER — ORPHENADRINE CITRATE 30 MG/ML IJ SOLN
60.0000 mg | Freq: Once | INTRAMUSCULAR | Status: AC
  Administered 2016-11-19: 60 mg via INTRAMUSCULAR
  Filled 2016-11-19: qty 2

## 2016-11-19 MED ORDER — KETOROLAC TROMETHAMINE 30 MG/ML IJ SOLN
30.0000 mg | Freq: Once | INTRAMUSCULAR | Status: AC
  Administered 2016-11-19: 30 mg via INTRAMUSCULAR
  Filled 2016-11-19: qty 1

## 2016-11-19 MED ORDER — METHOCARBAMOL 500 MG PO TABS: 500.0000 mg | ORAL_TABLET | Freq: Four times a day (QID) | ORAL | 0 refills | Status: DC

## 2016-11-19 MED ORDER — KETOROLAC TROMETHAMINE 30 MG/ML IJ SOLN: 30.0000 mg | Freq: Once | INTRAMUSCULAR | Status: DC

## 2016-11-19 MED ORDER — MELOXICAM 15 MG PO TABS: 15.0000 mg | ORAL_TABLET | Freq: Every day | ORAL | 0 refills | Status: DC

## 2016-11-19 NOTE — ED Provider Notes (Signed)
[facility] Emergency Department Provider Note  ____________________________________________  Time seen: Approximately 7:49 PM  I have reviewed the triage vital signs and the nursing notes.   HISTORY  Chief Complaint Shoulder Pain    HPI Christopher Mason is a 56 y.o. male who presents to emergency department complaining of right shoulder pain. Patient reports pain to the posterior right shoulder for approximately 6 weeks. He denies any definitive injury. Patient reports that the pain radiates from her shoulder into his neck. He has known cervical degenerative disc diseaseas well as a history of rotator cuff repair. Patient denies any numbness or tingling down his right upper extremity. He reports pain with range of motion. No other injury or complaint. No medications for this complaint prior to arrival.   Past Medical History:  Diagnosis Date  . Asthma   . Bipolar disorder ([place])   . COPD (chronic obstructive pulmonary disease) ([place])   . Depression   . GERD (gastroesophageal reflux disease)   . HLD (hyperlipidemia)   . Hypertension   . Hypothyroid   . Irritable bowel syndrome (IBS)   . Schizophrenia [facility])     Patient Active Problem List   Diagnosis Date Noted  . GI bleeding 09/05/2015  . HLD (hyperlipidemia) 02/07/2015  . COPD (chronic obstructive pulmonary disease) ([place]) 02/07/2015  . Hyponatremia 02/07/2015  . Gastritis 02/07/2015  . Acute kidney injury ([place]) 11/20/2014  . ARF (acute renal failure) ([place]) 11/06/2014  . Hypokalemia 11/06/2014  . Hypotension 11/06/2014  . Cannabis use disorder, severe, dependence ([place]) 09/24/2014  . Undifferentiated schizophrenia ([place])   . Hypothyroidism 09/17/2014  . GERD (gastroesophageal reflux disease) 09/17/2014  . Tobacco use disorder 09/17/2014  . Non compliance w medication regimen 09/16/2014  . Asthma 09/16/2014  . HTN (hypertension) 09/16/2014  . Tardive dyskinesia 09/16/2014    Past Surgical History:   Procedure Laterality Date  . BACK SURGERY    . CARPAL TUNNEL RELEASE Bilateral   . ESOPHAGOGASTRODUODENOSCOPY (EGD) WITH PROPOFOL N/A 09/06/2015   Procedure: ESOPHAGOGASTRODUODENOSCOPY (EGD) WITH PROPOFOL;  Surgeon: Hulen Luster, MD;  Location: [facility] ENDOSCOPY;  Service: Endoscopy;  Laterality: N/A;  . ROTATOR CUFF REPAIR     2007    Prior to Admission medications   Medication Sig Start Date End Date Taking? Authorizing Provider  albuterol (PROVENTIL HFA;VENTOLIN HFA) 108 (90 BASE) MCG/ACT inhaler Inhale 2 puffs into the lungs every 4 (four) hours as needed for wheezing or shortness of breath. 09/25/14   Pucilowska, Jolanta B, MD  benzonatate (TESSALON PERLES) 100 MG capsule Take 1 capsule (100 mg total) by mouth every 6 (six) hours as needed for cough. 01/25/16   Loney Hering, MD  diclofenac sodium (VOLTAREN) 1 % GEL Apply 2 g topically 3 (three) times daily. 06/01/16   Nance Pear, MD  hydrOXYzine (ATARAX/VISTARIL) 50 MG tablet Take 50 mg by mouth 3 (three) times daily as needed.    [provider]  ibuprofen (ADVIL,MOTRIN) 800 MG tablet Take 1 tablet (800 mg total) by mouth every 8 (eight) hours as needed. 01/25/16   Loney Hering, MD  meloxicam (MOBIC) 15 MG tablet Take 1 tablet (15 mg total) by mouth daily. 11/19/16   Cuthriell, Charline Bills, PA-C  methocarbamol (ROBAXIN) 500 MG tablet Take 1 tablet (500 mg total) by mouth 4 (four) times daily. 11/19/16   Cuthriell, Charline Bills, PA-C  omeprazole (PRILOSEC) 20 MG capsule Take 1 capsule (20 mg total) by mouth 2 (two) times daily before a meal. 09/06/15  Loletha Grayer, MD  ondansetron (ZOFRAN) 4 MG tablet Take 1 tablet (4 mg total) by mouth daily as needed for nausea or vomiting. 04/25/16   Lavonia Drafts, MD  QUEtiapine (SEROQUEL) 300 MG tablet Take 1 tablet (300 mg total) by mouth at bedtime. 09/29/14   Pucilowska, Jolanta B, MD  venlafaxine (EFFEXOR) 50 MG tablet Take 50 mg by mouth 2 (two) times daily.    [provider]   zolpidem (AMBIEN) 5 MG tablet Take 5 mg by mouth at bedtime as needed for sleep.    [provider]    Allergies Aspirin  Family History  Problem Relation Age of Onset  . Hypertension Father   . Cataracts Father   . Diabetes Sister   . Diabetes Brother   . Dementia Mother     Social History Social History  Substance Use Topics  . Smoking status: Current Every Day Smoker    Packs/day: 1.00    Types: Cigarettes  . Smokeless tobacco: Never Used  . Alcohol use No     Review of Systems  Constitutional: No fever/chills Cardiovascular: no chest pain. Respiratory: no cough. No SOB. Musculoskeletal: Positive for posterior right shoulder pain Skin: Negative for rash, abrasions, lacerations, ecchymosis. Neurological: Negative for headaches, focal weakness or numbness. 10-point ROS otherwise negative.  ____________________________________________   PHYSICAL EXAM:  VITAL SIGNS: ED Triage Vitals [11/19/16 1824]  Enc Vitals Group     BP 102/71     Pulse Rate 69     Resp 20     Temp 97.9 F (36.6 C)     Temp Source Oral     SpO2 99 %     Weight 155 lb (70.3 kg)     Height 5\' 7"  (1.702 m)     Head Circumference      Peak Flow      Pain Score 10     Pain Loc      Pain Edu?      Excl. in [place]?      Constitutional: Alert and oriented. Well appearing and in no acute distress. Eyes: Conjunctivae are normal. PERRL. EOMI. Head: Atraumatic. Neck: No stridor.  No cervical spine tenderness to palpation  Cardiovascular: Normal rate, regular rhythm. Normal S1 and S2.  Good peripheral circulation. Respiratory: Normal respiratory effort without tachypnea or retractions. Lungs CTAB. Good air entry to the bases with no decreased or absent breath sounds. Musculoskeletal: Full range of motion to all extremities. No gross deformities appreciated.No visible deformity to shoulder inspection. No gross edema. Patient does have full range of motion with coaxing. No tenderness to  palpation over the anterolateral aspect of the shoulder. Patient does have tenderness to palpation of the trapezius muscle. No palpable abnormality. No tenderness to palpation over the scapula or scapular spine. Neurologic:  Normal speech and language. No gross focal neurologic deficits are appreciated.  Skin:  Skin is warm, dry and intact. No rash noted. Psychiatric: Mood and affect are normal. Speech and behavior are normal. Patient exhibits appropriate insight and judgement.   ____________________________________________   LABS (all labs ordered are listed, but only abnormal results are displayed)  Labs Reviewed - No data to display ____________________________________________  EKG   ____________________________________________  RADIOLOGY Diamantina Providence Cuthriell, personally viewed and evaluated these images (plain radiographs) as part of my medical decision making, as well as reviewing the written report by the radiologist.  Dg Shoulder Right  Result Date: 11/19/2016 CLINICAL DATA:  Right shoulder pain for 6 weeks without  known injury. EXAM: RIGHT SHOULDER - 2+ VIEW COMPARISON:  None. FINDINGS: There is no evidence of fracture or dislocation. Mild degenerative joint disease is seen involving the right acromioclavicular joint. Soft tissues are unremarkable. 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 No acute abnormality seen in the right shoulder. Electronically Signed   By: Marijo Conception, M.D.   On: 11/19/2016 19:46    ____________________________________________    PROCEDURES  Procedure(s) performed:    Procedures    Medications  orphenadrine (NORFLEX) injection 60 mg (not administered)  ketorolac (TORADOL) 30 MG/ML injection 30 mg (not administered)     ____________________________________________   INITIAL IMPRESSION / ASSESSMENT AND PLAN / ED COURSE  Pertinent labs & imaging results that were available during my care  of the patient were reviewed by me and considered in my medical decision making (see chart for details).  Review of the [place] CSRS was performed in accordance of the [place] prior to dispensing any controlled drugs.     Patient's diagnosis is consistent with trapezius muscle spasm. X-ray reveals no acute osseous 190. No history trauma. Patient is tender to palpation over the distribution of the trapezius muscle. No palpable abnormality. No indication for further imaging at this time. Patient is given a shot of Toradol and muscle relaxant emergency department.. Patient will be discharged home with prescriptions for anti-inflammatory muscle relaxer. Patient is to follow up with orthopedics as needed or otherwise directed. Patient is given ED precautions to return to the ED for any worsening or new symptoms.     ____________________________________________  FINAL CLINICAL IMPRESSION(S) / ED DIAGNOSES  Final diagnoses:  Trapezius muscle spasm      NEW MEDICATIONS STARTED DURING THIS VISIT:  New Prescriptions   MELOXICAM (MOBIC) 15 MG TABLET    Take 1 tablet (15 mg total) by mouth daily.   METHOCARBAMOL (ROBAXIN) 500 MG TABLET    Take 1 tablet (500 mg total) by mouth 4 (four) times daily.        This chart was dictated using voice recognition software/Dragon. Despite best efforts to proofread, errors can occur which can change the meaning. Any change was purely unintentional.    Darletta Moll, PA-C 11/19/16 2017    Nance Pear, MD 11/19/16 2024

## 2016-11-19 NOTE — ED Notes (Signed)
Patient reports right shoulder pain for several weeks, denies known injury.  Reports rotator repair back in 2000.  Skin warm and dry, good right radial pulse, decreased ROM due to pain.

## 2016-11-19 NOTE — ED Triage Notes (Signed)
Pt c/o right shoulder pain for 6 weeks. Unsure if injured or not. Pt is not great historian. Does hurt significantly worse when moves or raises arm.  Skin warm and dry. Respirations unlabored.  VSS.  Ambulatory. No obvious deformity with clothes on.

## 2016-12-19 ENCOUNTER — Encounter: Payer: Self-pay | Admitting: Emergency Medicine

## 2016-12-19 ENCOUNTER — Emergency Department
Admission: EM | Admit: 2016-12-19 | Discharge: 2016-12-19 | Disposition: A | Discharge status: Home / Self Care | Payer: Medicaid Other | Attending: Emergency Medicine | Admitting: Emergency Medicine

## 2016-12-19 ENCOUNTER — Emergency Department: Payer: Medicaid Other

## 2016-12-19 DIAGNOSIS — J45909 Unspecified asthma, uncomplicated: Secondary | ICD-10-CM | POA: Insufficient documentation

## 2016-12-19 DIAGNOSIS — M25511 Pain in right shoulder: Secondary | ICD-10-CM | POA: Diagnosis not present

## 2016-12-19 DIAGNOSIS — R1084 Generalized abdominal pain: Secondary | ICD-10-CM | POA: Diagnosis present

## 2016-12-19 DIAGNOSIS — E039 Hypothyroidism, unspecified: Secondary | ICD-10-CM | POA: Insufficient documentation

## 2016-12-19 DIAGNOSIS — J441 Chronic obstructive pulmonary disease with (acute) exacerbation: Secondary | ICD-10-CM | POA: Diagnosis not present

## 2016-12-19 DIAGNOSIS — I10 Essential (primary) hypertension: Secondary | ICD-10-CM | POA: Diagnosis not present

## 2016-12-19 DIAGNOSIS — Z79899 Other long term (current) drug therapy: Secondary | ICD-10-CM | POA: Diagnosis not present

## 2016-12-19 DIAGNOSIS — G8929 Other chronic pain: Secondary | ICD-10-CM

## 2016-12-19 DIAGNOSIS — F17210 Nicotine dependence, cigarettes, uncomplicated: Secondary | ICD-10-CM | POA: Insufficient documentation

## 2016-12-19 LAB — URINALYSIS, COMPLETE (UACMP) WITH MICROSCOPIC
BACTERIA UA: NONE SEEN
BILIRUBIN URINE: NEGATIVE
GLUCOSE, UA: NEGATIVE mg/dL
Hgb urine dipstick: NEGATIVE
KETONES UR: NEGATIVE mg/dL
LEUKOCYTES UA: NEGATIVE
NITRITE: NEGATIVE
PH: 6 (ref 5.0–8.0)
Protein, ur: NEGATIVE mg/dL
SQUAMOUS EPITHELIAL / LPF: NONE SEEN
Specific Gravity, Urine: 1.012 (ref 1.005–1.030)

## 2016-12-19 LAB — COMPREHENSIVE METABOLIC PANEL
ALK PHOS: 115 U/L (ref 38–126)
ALT: 52 U/L (ref 17–63)
AST: 76 U/L — AB (ref 15–41)
Albumin: 4.4 g/dL (ref 3.5–5.0)
Anion gap: 11 (ref 5–15)
BUN: 22 mg/dL — AB (ref 6–20)
CHLORIDE: 99 mmol/L — AB (ref 101–111)
CO2: 26 mmol/L (ref 22–32)
Calcium: 9.8 mg/dL (ref 8.9–10.3)
Creatinine, Ser: 1.09 mg/dL (ref 0.61–1.24)
GFR calc Af Amer: >60 mL/min (ref >60)
GFR calc non Af Amer: >60 mL/min (ref >60)
Glucose, Bld: 100 mg/dL — ABNORMAL HIGH (ref 65–99)
Potassium: 3.7 mmol/L (ref 3.5–5.1)
SODIUM: 136 mmol/L (ref 135–145)
TOTAL PROTEIN: 8.1 g/dL (ref 6.5–8.1)
Total Bilirubin: 0.6 mg/dL (ref 0.3–1.2)

## 2016-12-19 LAB — LIPASE, BLOOD: LIPASE: 26 U/L (ref 11–51)

## 2016-12-19 LAB — CBC
HEMATOCRIT: 41.7 % (ref 40.0–52.0)
Hemoglobin: 14.5 g/dL (ref 13.0–18.0)
MCH: 30.6 pg (ref 26.0–34.0)
MCHC: 34.8 g/dL (ref 32.0–36.0)
MCV: 88.1 fL (ref 80.0–100.0)
Platelets: 183 10*3/uL (ref 150–440)
RBC: 4.73 MIL/uL (ref 4.40–5.90)
RDW: 15.6 % — AB (ref 11.5–14.5)
WBC: 10.5 10*3/uL (ref 3.8–10.6)

## 2016-12-19 MED ORDER — ONDANSETRON 4 MG PO TBDP
8.0000 mg | ORAL_TABLET | Freq: Once | ORAL | Status: AC
  Administered 2016-12-19: 8 mg via ORAL
  Filled 2016-12-19: qty 2

## 2016-12-19 MED ORDER — DOXYCYCLINE HYCLATE 100 MG PO CAPS: 100.0000 mg | ORAL_CAPSULE | Freq: Two times a day (BID) | ORAL | 0 refills | Status: DC

## 2016-12-19 MED ORDER — PREDNISONE 20 MG PO TABS: 40.0000 mg | ORAL_TABLET | Freq: Every day | ORAL | 0 refills | Status: DC

## 2016-12-19 NOTE — ED Triage Notes (Signed)
Pt to ed with c/o abd pain and bloating for several days.  Denies n/v/d.

## 2016-12-19 NOTE — ED Notes (Signed)
MD notified pt wants something for pain

## 2016-12-19 NOTE — ED Provider Notes (Signed)
[facility] Emergency Department Provider Note  ____________________________________________  Time seen: Approximately 3:56 PM  I have reviewed the triage vital signs and the nursing notes.   HISTORY  Chief Complaint Abdominal Pain    HPI [name] is a 56 y.o. male ho complains of generalized abdominal pain and bloating for the past week. No nausea vomiting diarrhea. No aggravating or alleviating factors. No fevers chills or sweats. Also reports he has a cough with increased sputum production and a history of COPD. Generalized abdominal pain is mild and aching.   Past Medical History:  Diagnosis Date  . Asthma   . Bipolar disorder ([place])   . COPD (chronic obstructive pulmonary disease) ([place])   . Depression   . GERD (gastroesophageal reflux disease)   . HLD (hyperlipidemia)   . Hypertension   . Hypothyroid   . Irritable bowel syndrome (IBS)   . Schizophrenia [facility])      Patient Active Problem List   Diagnosis Date Noted  . GI bleeding 09/05/2015  . HLD (hyperlipidemia) 02/07/2015  . COPD (chronic obstructive pulmonary disease) ([place]) 02/07/2015  . Hyponatremia 02/07/2015  . Gastritis 02/07/2015  . Acute kidney injury ([place]) 11/20/2014  . ARF (acute renal failure) ([place]) 11/06/2014  . Hypokalemia 11/06/2014  . Hypotension 11/06/2014  . Cannabis use disorder, severe, dependence ([place]) 09/24/2014  . Undifferentiated schizophrenia ([place])   . Hypothyroidism 09/17/2014  . GERD (gastroesophageal reflux disease) 09/17/2014  . Tobacco use disorder 09/17/2014  . Non compliance w medication regimen 09/16/2014  . Asthma 09/16/2014  . HTN (hypertension) 09/16/2014  . Tardive dyskinesia 09/16/2014     Past Surgical History:  Procedure Laterality Date  . BACK SURGERY    . CARPAL TUNNEL RELEASE Bilateral   . ESOPHAGOGASTRODUODENOSCOPY (EGD) WITH PROPOFOL N/A 09/06/2015   Procedure: ESOPHAGOGASTRODUODENOSCOPY (EGD) WITH PROPOFOL;  Surgeon: Hulen Luster, MD;   Location: [facility] ENDOSCOPY;  Service: Endoscopy;  Laterality: N/A;  . ROTATOR CUFF REPAIR     2007     Prior to Admission medications   Medication Sig Start Date End Date Taking? Authorizing Provider  albuterol (PROVENTIL HFA;VENTOLIN HFA) 108 (90 BASE) MCG/ACT inhaler Inhale 2 puffs into the lungs every 4 (four) hours as needed for wheezing or shortness of breath. 09/25/14   Pucilowska, Jolanta B, MD  benzonatate (TESSALON PERLES) 100 MG capsule Take 1 capsule (100 mg total) by mouth every 6 (six) hours as needed for cough. 01/25/16   Loney Hering, MD  diclofenac sodium (VOLTAREN) 1 % GEL Apply 2 g topically 3 (three) times daily. 06/01/16   Nance Pear, MD  doxycycline (VIBRAMYCIN) 100 MG capsule Take 1 capsule (100 mg total) by mouth 2 (two) times daily. 12/19/16   Carrie Mew, MD  hydrOXYzine (ATARAX/VISTARIL) 50 MG tablet Take 50 mg by mouth 3 (three) times daily as needed.    [provider]  ibuprofen (ADVIL,MOTRIN) 800 MG tablet Take 1 tablet (800 mg total) by mouth every 8 (eight) hours as needed. 01/25/16   Loney Hering, MD  meloxicam (MOBIC) 15 MG tablet Take 1 tablet (15 mg total) by mouth daily. 11/19/16   Cuthriell, Charline Bills, PA-C  methocarbamol (ROBAXIN) 500 MG tablet Take 1 tablet (500 mg total) by mouth 4 (four) times daily. 11/19/16   Cuthriell, Charline Bills, PA-C  omeprazole (PRILOSEC) 20 MG capsule Take 1 capsule (20 mg total) by mouth 2 (two) times daily before a meal. 09/06/15   Loletha Grayer, MD  ondansetron (ZOFRAN) 4 MG tablet  Take 1 tablet (4 mg total) by mouth daily as needed for nausea or vomiting. 04/25/16   Lavonia Drafts, MD  predniSONE (DELTASONE) 20 MG tablet Take 2 tablets (40 mg total) by mouth daily. 12/19/16   Carrie Mew, MD  QUEtiapine (SEROQUEL) 300 MG tablet Take 1 tablet (300 mg total) by mouth at bedtime. 09/29/14   Pucilowska, Jolanta B, MD  venlafaxine (EFFEXOR) 50 MG tablet Take 50 mg by mouth 2 (two) times daily.    [provider]  zolpidem (AMBIEN) 5 MG tablet Take 5 mg by mouth at bedtime as needed for sleep.    [provider]     Allergies Aspirin   Family History  Problem Relation Age of Onset  . Hypertension Father   . Cataracts Father   . Diabetes Sister   . Diabetes Brother   . Dementia Mother     Social History Social History  Substance Use Topics  . Smoking status: Current Every Day Smoker    Packs/day: 1.00    Types: Cigarettes  . Smokeless tobacco: Never Used  . Alcohol use No    Review of Systems  Constitutional:   No fever or chills.  ENT:   No sore throat. No rhinorrhea. Cardiovascular:   No chest pain or syncope. Respiratory:   Positive shortness of breath and cough. Gastrointestinal:   positive abdominal pain withoutvomiting and diarrhea.  Musculoskeletal:   Negative for focal pain or swelling All other systems reviewed and are negative except as documented above in ROS and HPI.  ____________________________________________   PHYSICAL EXAM:  VITAL SIGNS: ED Triage Vitals  Enc Vitals Group     BP 12/19/16 1128 (!) 124/94     Pulse Rate 12/19/16 1128 85     Resp 12/19/16 1128 20     Temp 12/19/16 1128 97.9 F (36.6 C)     Temp Source 12/19/16 1128 Oral     SpO2 12/19/16 1128 96 %     Weight 12/19/16 1129 166 lb (75.3 kg)     Height 12/19/16 1129 5\' 7"  (1.702 m)     Head Circumference --      Peak Flow --      Pain Score 12/19/16 1131 7     Pain Loc --      Pain Edu? --      Excl. in [place]? --     Vital signs reviewed, nursing assessments reviewed.   Constitutional:   Alert and oriented. Well appearing and in no distress. Eyes:   No scleral icterus.  EOMI. No nystagmus. No conjunctival pallor. PERRL. ENT   Head:   Normocephalic and atraumatic.   Nose:   No congestion/rhinnorhea.    Mouth/Throat:   MMM, no pharyngeal erythema. No peritonsillar mass.    Neck:   No meningismus. Full ROM Hematological/Lymphatic/Immunilogical:    No cervical lymphadenopathy. Cardiovascular:   RRR. Symmetric bilateral radial and DP pulses.  No murmurs.  Respiratory:   Normal respiratory effort without tachypnea/retractions. Breath sounds are clear and equal bilaterally. No wheezes/rales/rhonchi. Gastrointestinal:   Soft and nontender. Non distended. There is no CVA tenderness.  No rebound, rigidity, or guarding. Genitourinary:   deferred Musculoskeletal:   Normal range of motion in all extremities. No joint effusions.  No lower extremity tenderness.  No edema. Neurologic:   Normal speech and language.  Motor grossly intact. No gross focal neurologic deficits are appreciated.  Skin:    Skin is warm, dry and intact. No rash noted.  No  petechiae, purpura, or bullae.  ____________________________________________    LABS (pertinent positives/negatives) (all labs ordered are listed, but only abnormal results are displayed) Labs Reviewed  COMPREHENSIVE METABOLIC PANEL - Abnormal; Notable for the following:       Result Value   Chloride 99 (*)    Glucose, Bld 100 (*)    BUN 22 (*)    AST 76 (*)    All other components within normal limits  CBC - Abnormal; Notable for the following:    RDW 15.6 (*)    All other components within normal limits  URINALYSIS, COMPLETE (UACMP) WITH MICROSCOPIC - Abnormal; Notable for the following:    Color, Urine YELLOW (*)    APPearance CLEAR (*)    All other components within normal limits  LIPASE, BLOOD   ____________________________________________   EKG    ____________________________________________    RADIOLOGY  Dg Chest 2 View  Result Date: 12/19/2016 CLINICAL DATA:  Cough and congestion. EXAM: CHEST  2 VIEW COMPARISON:  01/25/2016 . FINDINGS: Mediastinum and hilar structures normal. Low lung volumes with mild bibasilar atelectasis. Mild right base infiltrate cannot be excluded . No pleural effusion or pneumothorax. Heart size normal. Degenerative changes thoracic spine. Stable mild  lower thoracic vertebral body compression fracture. IMPRESSION: Low lung volumes with mild bibasilar atelectasis. Mild right base infiltrate cannot be excluded . Electronically Signed   By: Marcello Moores  Register   On: 12/19/2016 14:56    ____________________________________________   PROCEDURES Procedures  ____________________________________________   INITIAL IMPRESSION / ASSESSMENT AND PLAN / ED COURSE  Pertinent labs & imaging results that were available during my care of the patient were reviewed by me and considered in my medical decision making (see chart for details).  Patient well ppearing no acute distress, resents with symptoms of worsening COPD. No wheezing, normal expiratory phase, normal vital signs. Chest x-ray unremarkable. I'll have him take doxycycline and short course of steroids and follow-up with primary care.Considering the patient's symptoms, medical history, and physical examination today, I have low suspicion for cholecystitis or biliary pathology, pancreatitis, perforation or bowel obstruction, hernia, intra-abdominal abscess, AAA or dissection, volvulus or intussusception, mesenteric ischemia, or appendicitis.  Considering the patient's symptoms, medical history, and physical examination today, I have low suspicion for ACS, PE, TAD, pneumothorax, carditis, mediastinitis, pneumonia, CHF, or sepsis.        ____________________________________________   FINAL CLINICAL IMPRESSION(S) / ED DIAGNOSES  Final diagnoses:  Chronic right shoulder pain  COPD exacerbation (HCC)      New Prescriptions   DOXYCYCLINE (VIBRAMYCIN) 100 MG CAPSULE    Take 1 capsule (100 mg total) by mouth 2 (two) times daily.   PREDNISONE (DELTASONE) 20 MG TABLET    Take 2 tablets (40 mg total) by mouth daily.     Portions of this note were generated with dragon dictation software. Dictation errors may occur despite best attempts at proofreading.    Carrie Mew, MD 12/19/16  [phone]

## 2016-12-24 ENCOUNTER — Encounter: Payer: Self-pay | Admitting: Emergency Medicine

## 2016-12-24 ENCOUNTER — Emergency Department
Admission: EM | Admit: 2016-12-24 | Discharge: 2016-12-24 | Disposition: A | Discharge status: Home / Self Care | Payer: Medicaid Other | Attending: Emergency Medicine | Admitting: Emergency Medicine

## 2016-12-24 DIAGNOSIS — E119 Type 2 diabetes mellitus without complications: Secondary | ICD-10-CM | POA: Insufficient documentation

## 2016-12-24 DIAGNOSIS — I10 Essential (primary) hypertension: Secondary | ICD-10-CM | POA: Diagnosis not present

## 2016-12-24 DIAGNOSIS — F17210 Nicotine dependence, cigarettes, uncomplicated: Secondary | ICD-10-CM | POA: Insufficient documentation

## 2016-12-24 DIAGNOSIS — J45909 Unspecified asthma, uncomplicated: Secondary | ICD-10-CM | POA: Diagnosis not present

## 2016-12-24 DIAGNOSIS — Z79899 Other long term (current) drug therapy: Secondary | ICD-10-CM | POA: Diagnosis not present

## 2016-12-24 DIAGNOSIS — M25511 Pain in right shoulder: Secondary | ICD-10-CM | POA: Insufficient documentation

## 2016-12-24 DIAGNOSIS — J449 Chronic obstructive pulmonary disease, unspecified: Secondary | ICD-10-CM | POA: Insufficient documentation

## 2016-12-24 DIAGNOSIS — G8929 Other chronic pain: Secondary | ICD-10-CM

## 2016-12-24 MED ORDER — LORAZEPAM 2 MG/ML IJ SOLN
2.0000 mg | Freq: Once | INTRAMUSCULAR | Status: AC
  Administered 2016-12-24: 2 mg via INTRAMUSCULAR
  Filled 2016-12-24: qty 1

## 2016-12-24 MED ORDER — MELOXICAM 15 MG PO TABS: 15.0000 mg | ORAL_TABLET | Freq: Every day | ORAL | 0 refills | Status: DC

## 2016-12-24 MED ORDER — KETOROLAC TROMETHAMINE 60 MG/2ML IM SOLN
15.0000 mg | INTRAMUSCULAR | Status: AC
  Administered 2016-12-24: 15 mg via INTRAMUSCULAR
  Filled 2016-12-24: qty 2

## 2016-12-24 NOTE — ED Notes (Signed)
Patient oxygen saturations dropped to 86%. Oxygen applied. Dr. Karma Greaser notified.

## 2016-12-24 NOTE — ED Notes (Signed)
Pt moaning and crying in lobby; understands he will be taken to a room as soon as one is available;

## 2016-12-24 NOTE — Discharge Instructions (Addendum)
As we discussed, you need to follow-up with an orthopedic specialist at the next available opportunity to discuss your chronic right shoulder and shoulder blade pain.  continue taking your regular medications.  We gave you a new prescription for meloxicam which helped previously.  Please schedule an next available appointment according to the included contact information.

## 2016-12-24 NOTE — ED Notes (Signed)
Patient with complaint of right shoulder pain times three months. Patient reports that pain has become worse. Patient was seen here times one month ago for the same.

## 2016-12-24 NOTE — ED Provider Notes (Signed)
Patient alert and oriented. Shoulder pain improved. Ok for d/c at this time   Lavonia Drafts, MD 12/24/16 1029

## 2016-12-24 NOTE — ED Notes (Signed)

## 2016-12-24 NOTE — ED Triage Notes (Addendum)
Pt c/o right shoulder pain for over a month; pt says when he was here a month ago he was told he has a tumor in that shoulder; quick review of xrays taken that visit show no mention of tumor; pt is adamant that he was told he has a tumor "I can hear"; pt shouting at staff at times; anxious; [place]; pt denies any injury to shoulder

## 2016-12-24 NOTE — ED Provider Notes (Signed)
[facility] Emergency Department Provider Note  ____________________________________________   None    (approximate)  I have reviewed the triage vital signs and the nursing notes.   HISTORY  Chief Complaint Shoulder Pain  History is limited by acute pain and/or chronic psychiatric disease  HPI Christopher Mason is a 56 y.o. male with extensive psychiatric disease history who presents by private vehicle for evaluation of severe right shoulder and shoulder blade pain. he reports he has had the pain for 2 to 3 months but it is worse tonight. He states that he was seen in this emergency department about a month ago and was told he is a tumor in his shoulder. He has not followed up with orthopedics although he states he has tried to do so. He has an ACT team to help him with his medications and he states he has been taking them, but he recently ran out of meloxicam. Nothing in particular makes the pain better and moving it makes it worse. He cannot characterize the pain other than to say that it hurts and he cannot take the pain. He denies having any recent injuries. He states that he can move it normally but it hurts to do so. He is very anxious and worked up, yelling and moaning and difficult to redirect. he denies any other pain or symptoms and only wants to talk about the shoulder and the area around his shoulder blade.   Past Medical History:  Diagnosis Date  . Asthma   . Bipolar disorder ([place])   . COPD (chronic obstructive pulmonary disease) ([place])   . Depression   . GERD (gastroesophageal reflux disease)   . HLD (hyperlipidemia)   . Hypertension   . Hypothyroid   . Irritable bowel syndrome (IBS)   . Schizophrenia [facility])     Patient Active Problem List   Diagnosis Date Noted  . GI bleeding 09/05/2015  . HLD (hyperlipidemia) 02/07/2015  . COPD (chronic obstructive pulmonary disease) ([place]) 02/07/2015  . Hyponatremia 02/07/2015  . Gastritis 02/07/2015  .  Acute kidney injury ([place]) 11/20/2014  . ARF (acute renal failure) ([place]) 11/06/2014  . Hypokalemia 11/06/2014  . Hypotension 11/06/2014  . Cannabis use disorder, severe, dependence ([place]) 09/24/2014  . Undifferentiated schizophrenia ([place])   . Hypothyroidism 09/17/2014  . GERD (gastroesophageal reflux disease) 09/17/2014  . Tobacco use disorder 09/17/2014  . Non compliance w medication regimen 09/16/2014  . Asthma 09/16/2014  . HTN (hypertension) 09/16/2014  . Tardive dyskinesia 09/16/2014    Past Surgical History:  Procedure Laterality Date  . BACK SURGERY    . CARPAL TUNNEL RELEASE Bilateral   . ESOPHAGOGASTRODUODENOSCOPY (EGD) WITH PROPOFOL N/A 09/06/2015   Procedure: ESOPHAGOGASTRODUODENOSCOPY (EGD) WITH PROPOFOL;  Surgeon: Hulen Luster, MD;  Location: [facility] ENDOSCOPY;  Service: Endoscopy;  Laterality: N/A;  . ROTATOR CUFF REPAIR     2007    Prior to Admission medications   Medication Sig Start Date End Date Taking? Authorizing Provider  albuterol (PROVENTIL HFA;VENTOLIN HFA) 108 (90 BASE) MCG/ACT inhaler Inhale 2 puffs into the lungs every 4 (four) hours as needed for wheezing or shortness of breath. 09/25/14   Pucilowska, Jolanta B, MD  benzonatate (TESSALON PERLES) 100 MG capsule Take 1 capsule (100 mg total) by mouth every 6 (six) hours as needed for cough. 01/25/16   Loney Hering, MD  diclofenac sodium (VOLTAREN) 1 % GEL Apply 2 g topically 3 (three) times daily. 06/01/16   Nance Pear, MD  doxycycline (VIBRAMYCIN) 100  MG capsule Take 1 capsule (100 mg total) by mouth 2 (two) times daily. 12/19/16   Carrie Mew, MD  hydrOXYzine (ATARAX/VISTARIL) 50 MG tablet Take 50 mg by mouth 3 (three) times daily as needed.    [provider]  ibuprofen (ADVIL,MOTRIN) 800 MG tablet Take 1 tablet (800 mg total) by mouth every 8 (eight) hours as needed. 01/25/16   Loney Hering, MD  meloxicam (MOBIC) 15 MG tablet Take 1 tablet (15 mg total) by mouth daily. 12/24/16   Hinda Kehr, MD  methocarbamol (ROBAXIN) 500 MG tablet Take 1 tablet (500 mg total) by mouth 4 (four) times daily. 11/19/16   Cuthriell, Charline Bills, PA-C  omeprazole (PRILOSEC) 20 MG capsule Take 1 capsule (20 mg total) by mouth 2 (two) times daily before a meal. 09/06/15   Leslye Peer, Richard, MD  ondansetron (ZOFRAN) 4 MG tablet Take 1 tablet (4 mg total) by mouth daily as needed for nausea or vomiting. 04/25/16   Lavonia Drafts, MD  predniSONE (DELTASONE) 20 MG tablet Take 2 tablets (40 mg total) by mouth daily. 12/19/16   Carrie Mew, MD  QUEtiapine (SEROQUEL) 300 MG tablet Take 1 tablet (300 mg total) by mouth at bedtime. 09/29/14   Pucilowska, Jolanta B, MD  venlafaxine (EFFEXOR) 50 MG tablet Take 50 mg by mouth 2 (two) times daily.    [provider]  zolpidem (AMBIEN) 5 MG tablet Take 5 mg by mouth at bedtime as needed for sleep.    [provider]    Allergies Aspirin  Family History  Problem Relation Age of Onset  . Hypertension Father   . Cataracts Father   . Diabetes Sister   . Diabetes Brother   . Dementia Mother     Social History Social History  Substance Use Topics  . Smoking status: Current Every Day Smoker    Packs/day: 1.00    Types: Cigarettes  . Smokeless tobacco: Never Used  . Alcohol use No    Review of Systems History is limited by acute pain and/or chronic psychiatric disease. he complains of acute on chronic right shoulder/shoulder blade pain but denies chest pain, shortness of breath, and abdominal pain ____________________________________________   PHYSICAL EXAM:  VITAL SIGNS: ED Triage Vitals  Enc Vitals Group     BP 12/24/16 0313 132/73     Pulse Rate 12/24/16 0313 70     Resp 12/24/16 0313 (!) 22     Temp 12/24/16 0313 97.6 F (36.4 C)     Temp Source 12/24/16 0313 Oral     SpO2 12/24/16 0313 95 %     Weight 12/24/16 0309 75.3 kg (166 lb)     Height 12/24/16 0309 1.702 m (5\' 7" )     Head Circumference --      Peak Flow --       Pain Score 12/24/16 0308 10     Pain Loc --      Pain Edu? --      Excl. in [place]? --     Constitutional: alert, anxious, yelling and reporting severe pain Head: Atraumatic. Neck: No stridor.  No meningeal signs.  No cervical spine tenderness to palpation. Cardiovascular: Normal rate, regular rhythm. Good peripheral circulation. Grossly normal heart sounds. Respiratory: Normal respiratory effort.  No retractions. Lungs CTAB. Gastrointestinal: Soft and nontender. No distention.  Musculoskeletal: No gross deformities of extremities.  Patient is moving all four limbs, including his RUE.  although he at first seems to be favoring it, I am  able to passively and fully range in his right upper extremity although he does yell a little bit louder when I raise it straight up above his head. There are no limitations, no palpable defects, and he does have some tenderness to palpation of the trapezius on the right side, but otherwise his exam is unremarkable Neurologic:  No gross focal neurologic deficits are appreciated.  Skin:  Skin is warm, dry and intact. No rash noted. Psychiatric: Mood and affect are anxious, agitated, and pressured.  Difficult to obtain history.  ____________________________________________   LABS (all labs ordered are listed, but only abnormal results are displayed)  Labs Reviewed - No data to display ____________________________________________  EKG  ED ECG REPORT I, Bethan Adamek, the attending physician, personally viewed and interpreted this ECG.  Date: 12/24/2016 EKG Time: 3:02 AM Rate: 76 Rhythm: normal sinus rhythm QRS Axis: normal Intervals: normal ST/T Wave abnormalities: normal Narrative Interpretation: no evidence of acute ischemia  ____________________________________________  RADIOLOGY   No results found.  ____________________________________________   PROCEDURES  Critical Care performed: No   Procedure(s) performed:    Procedures   ____________________________________________   INITIAL IMPRESSION / ASSESSMENT AND PLAN / ED COURSE  Pertinent labs & imaging results that were available during my care of the patient were reviewed by me and considered in my medical decision making (see chart for details).  the patient is extremely agitated and anxious and it is difficult to obtain a good exam or history. I feel he would benefit from some Ativan both for the muscle relaxing qualities as well as to help calm him down. I looked back through the medical record and see that he had very similar symptoms a little over a month ago with pain in the trapezius region but normal range of motion of the shoulder. It seems to be at that point he was prescribed the meloxicam. He also benefited from a Toradol injection at the time. This morning I will give him an injection of Toradol 15 mg intramuscular and Ativan 2 mg intramuscular and then reassess after the medication has a chance to work.  at this point I do not feel he would benefit from repeat imaging; he had x-rays at his visit about a month ago and denies having any new injuries, and there are no physical exam findings that would suggest the need for reimaging.   Clinical Course as of Dec 24 844  Sun Dec 24, 2016  6195 I reviewed the patient's prescription history over the last 12 months in the multi-state controlled substances database(s) that includes [place], [place], [place], [place], [place], [place], [place], [place], [place], [place], [place], [place], [place], and [place].  The patient has filled no controlled substances during that time.   [CF]  [phone] Patient has been sleeping comfortably after Ativan and Toradol.  Transferring ED care to Dr. Corky Downs for reassessment and likely discharge after the patient awakens.  [CF]    Clinical Course User Index [CF] Hinda Kehr, MD     ____________________________________________  FINAL CLINICAL IMPRESSION(S) / ED DIAGNOSES  Final diagnoses:  Chronic right shoulder pain     MEDICATIONS GIVEN DURING THIS VISIT:  Medications  ketorolac (TORADOL) injection 15 mg (15 mg Intramuscular Given 12/24/16 0510)  LORazepam (ATIVAN) injection 2 mg (2 mg Intramuscular Given 12/24/16 0509)     NEW OUTPATIENT MEDICATIONS STARTED DURING THIS VISIT:  Current Discharge Medication List      Current Discharge Medication List    CONTINUE these medications  which have CHANGED   Details  meloxicam (MOBIC) 15 MG tablet Take 1 tablet (15 mg total) by mouth daily. Qty: 30 tablet, Refills: 0        Current Discharge Medication List       Note:  This document was prepared using Dragon voice recognition software and may include unintentional dictation errors.    Hinda Kehr, MD 12/24/16 [phone]

## 2016-12-24 NOTE — ED Notes (Signed)
ED Provider at bedside. 

## 2017-04-05 ENCOUNTER — Other Ambulatory Visit: Payer: Self-pay

## 2017-04-05 MED ORDER — SUCRALFATE 1 G PO TABS: 1.0000 g | ORAL_TABLET | Freq: Every day | ORAL | 5 refills | Status: DC

## 2017-04-06 ENCOUNTER — Other Ambulatory Visit: Payer: Self-pay

## 2017-04-06 MED ORDER — SUCRALFATE 1 G PO TABS: 1.0000 g | ORAL_TABLET | Freq: Three times a day (TID) | ORAL | 5 refills | Status: DC

## 2017-04-11 ENCOUNTER — Other Ambulatory Visit: Payer: Self-pay | Admitting: Nurse Practitioner

## 2017-04-11 MED ORDER — TAMSULOSIN HCL 0.4 MG PO CAPS: 0.4000 mg | ORAL_CAPSULE | ORAL | 6 refills | Status: DC

## 2017-05-15 ENCOUNTER — Other Ambulatory Visit: Payer: Self-pay

## 2017-05-15 MED ORDER — AMLODIPINE BESYLATE 10 MG PO TABS: 10.0000 mg | ORAL_TABLET | Freq: Every day | ORAL | 2 refills | Status: DC

## 2017-05-15 MED ORDER — SOLIFENACIN SUCCINATE 10 MG PO TABS: ORAL_TABLET | ORAL | 2 refills | Status: DC

## 2017-05-15 MED ORDER — PRAVASTATIN SODIUM 10 MG PO TABS: 10.0000 mg | ORAL_TABLET | Freq: Every day | ORAL | 2 refills | Status: DC

## 2017-05-16 ENCOUNTER — Emergency Department: Payer: Medicaid Other

## 2017-05-16 ENCOUNTER — Emergency Department
Admission: EM | Admit: 2017-05-16 | Discharge: 2017-05-16 | Disposition: A | Discharge status: Home / Self Care | Payer: Medicaid Other | Attending: Emergency Medicine | Admitting: Emergency Medicine

## 2017-05-16 ENCOUNTER — Other Ambulatory Visit: Payer: Self-pay

## 2017-05-16 ENCOUNTER — Encounter: Payer: Self-pay | Admitting: Emergency Medicine

## 2017-05-16 DIAGNOSIS — F319 Bipolar disorder, unspecified: Secondary | ICD-10-CM | POA: Insufficient documentation

## 2017-05-16 DIAGNOSIS — F329 Major depressive disorder, single episode, unspecified: Secondary | ICD-10-CM | POA: Diagnosis not present

## 2017-05-16 DIAGNOSIS — F17210 Nicotine dependence, cigarettes, uncomplicated: Secondary | ICD-10-CM | POA: Insufficient documentation

## 2017-05-16 DIAGNOSIS — J449 Chronic obstructive pulmonary disease, unspecified: Secondary | ICD-10-CM | POA: Diagnosis not present

## 2017-05-16 DIAGNOSIS — F209 Schizophrenia, unspecified: Secondary | ICD-10-CM | POA: Diagnosis not present

## 2017-05-16 DIAGNOSIS — R112 Nausea with vomiting, unspecified: Secondary | ICD-10-CM | POA: Diagnosis not present

## 2017-05-16 DIAGNOSIS — Z79899 Other long term (current) drug therapy: Secondary | ICD-10-CM | POA: Insufficient documentation

## 2017-05-16 DIAGNOSIS — J45909 Unspecified asthma, uncomplicated: Secondary | ICD-10-CM | POA: Insufficient documentation

## 2017-05-16 DIAGNOSIS — N289 Disorder of kidney and ureter, unspecified: Secondary | ICD-10-CM

## 2017-05-16 DIAGNOSIS — E039 Hypothyroidism, unspecified: Secondary | ICD-10-CM | POA: Insufficient documentation

## 2017-05-16 DIAGNOSIS — K5900 Constipation, unspecified: Secondary | ICD-10-CM

## 2017-05-16 DIAGNOSIS — I10 Essential (primary) hypertension: Secondary | ICD-10-CM | POA: Insufficient documentation

## 2017-05-16 DIAGNOSIS — F1220 Cannabis dependence, uncomplicated: Secondary | ICD-10-CM | POA: Insufficient documentation

## 2017-05-16 DIAGNOSIS — R05 Cough: Secondary | ICD-10-CM | POA: Diagnosis present

## 2017-05-16 DIAGNOSIS — N3000 Acute cystitis without hematuria: Secondary | ICD-10-CM

## 2017-05-16 DIAGNOSIS — R197 Diarrhea, unspecified: Secondary | ICD-10-CM

## 2017-05-16 LAB — URINALYSIS, COMPLETE (UACMP) WITH MICROSCOPIC
Bilirubin Urine: NEGATIVE
Glucose, UA: NEGATIVE mg/dL
Hgb urine dipstick: NEGATIVE
KETONES UR: NEGATIVE mg/dL
Leukocytes, UA: NEGATIVE
Nitrite: NEGATIVE
PH: 6 (ref 5.0–8.0)
Protein, ur: 30 mg/dL — AB
SPECIFIC GRAVITY, URINE: 1.014 (ref 1.005–1.030)

## 2017-05-16 LAB — COMPREHENSIVE METABOLIC PANEL
ALBUMIN: 4 g/dL (ref 3.5–5.0)
ALK PHOS: 101 U/L (ref 38–126)
ALT: 18 U/L (ref 17–63)
AST: 21 U/L (ref 15–41)
Anion gap: 8 (ref 5–15)
BILIRUBIN TOTAL: 0.7 mg/dL (ref 0.3–1.2)
BUN: 10 mg/dL (ref 6–20)
CO2: 25 mmol/L (ref 22–32)
Calcium: 9.3 mg/dL (ref 8.9–10.3)
Chloride: 105 mmol/L (ref 101–111)
Creatinine, Ser: 1.5 mg/dL — ABNORMAL HIGH (ref 0.61–1.24)
GFR calc Af Amer: 58 mL/min — ABNORMAL LOW (ref >60)
GFR calc non Af Amer: 50 mL/min — ABNORMAL LOW (ref >60)
GLUCOSE: 126 mg/dL — AB (ref 65–99)
POTASSIUM: 3.7 mmol/L (ref 3.5–5.1)
Sodium: 138 mmol/L (ref 135–145)
TOTAL PROTEIN: 7.5 g/dL (ref 6.5–8.1)

## 2017-05-16 LAB — CBC
HCT: 45.6 % (ref 40.0–52.0)
Hemoglobin: 15.1 g/dL (ref 13.0–18.0)
MCH: 29.5 pg (ref 26.0–34.0)
MCHC: 33.2 g/dL (ref 32.0–36.0)
MCV: 88.6 fL (ref 80.0–100.0)
PLATELETS: 224 10*3/uL (ref 150–440)
RBC: 5.14 MIL/uL (ref 4.40–5.90)
RDW: 14.7 % — AB (ref 11.5–14.5)
WBC: 8.7 10*3/uL (ref 3.8–10.6)

## 2017-05-16 LAB — LIPASE, BLOOD: Lipase: 22 U/L (ref 11–51)

## 2017-05-16 LAB — INFLUENZA PANEL BY PCR (TYPE A & B)
Influenza A By PCR: NEGATIVE
Influenza B By PCR: NEGATIVE

## 2017-05-16 LAB — GROUP A STREP BY PCR: Group A Strep by PCR: NOT DETECTED

## 2017-05-16 MED ORDER — ONDANSETRON 4 MG PO TBDP: 4.0000 mg | ORAL_TABLET | Freq: Three times a day (TID) | ORAL | 0 refills | Status: DC | PRN

## 2017-05-16 MED ORDER — ONDANSETRON HCL 4 MG/2ML IJ SOLN
4.0000 mg | Freq: Once | INTRAMUSCULAR | Status: AC
  Administered 2017-05-16: 4 mg via INTRAVENOUS
  Filled 2017-05-16: qty 2

## 2017-05-16 MED ORDER — SULFAMETHOXAZOLE-TRIMETHOPRIM 400-80 MG PO TABS: 1.0000 | ORAL_TABLET | Freq: Two times a day (BID) | ORAL | 0 refills | Status: DC

## 2017-05-16 MED ORDER — SULFAMETHOXAZOLE-TRIMETHOPRIM 800-160 MG PO TABS
1.0000 | ORAL_TABLET | Freq: Once | ORAL | Status: AC
  Administered 2017-05-16: 1 via ORAL
  Filled 2017-05-16: qty 1

## 2017-05-16 MED ORDER — LOPERAMIDE HCL 2 MG PO TABS: 2.0000 mg | ORAL_TABLET | Freq: Four times a day (QID) | ORAL | 0 refills | Status: DC | PRN

## 2017-05-16 MED ORDER — SODIUM CHLORIDE 0.9 % IV BOLUS (SEPSIS)
1000.0000 mL | Freq: Once | INTRAVENOUS | Status: AC
  Administered 2017-05-16: 1000 mL via INTRAVENOUS

## 2017-05-16 NOTE — ED Notes (Signed)
Pt is resting in bed at this time complains of pain in the stomach area. Pt is A/Ox4

## 2017-05-16 NOTE — ED Triage Notes (Signed)
Pt  Ems from home for N,V,D constipation with generalized chest pain, temp., dizziness, cough since yesterday

## 2017-05-16 NOTE — Discharge Instructions (Addendum)
Please take a clear liquid diet for the next 24 hours, then advance to bland diet as tolerated.  Zofran is for nausea, loperamide is for diarrhea.  Please drink plenty of fluids to stay well-hydrated and to help protect your  kidneys.  Please take the entire course of antibiotics, even if you are feeling better.  Please make a follow-up appoint with your primary care physician to be reevaluated for your symptoms, and to have your kidney function rechecked.  Return to the emergency department for severe pain, lightheadedness or fainting, fever or chills, abdominal pain, shortness of breath, or any other symptoms concerning to you.

## 2017-05-16 NOTE — ED Notes (Signed)
Pt is resting, call light with in reach pt has no complaints at this time.

## 2017-05-16 NOTE — ED Notes (Signed)
Pt was given water and requested [place] sandwich. EDP advised clear liquids to start with, will adjust accordingly.

## 2017-05-16 NOTE — ED Provider Notes (Signed)
[facility] Emergency Department Provider Note  ____________________________________________  Time seen: Approximately 12:30 PM  I have reviewed the triage vital signs and the nursing notes.   HISTORY  Chief Complaint Influenza    HPI Shaarav Ripple is a 57 y.o. male with a history of bipolar disorder and schizophrenia, HTN, COPD with ongoing tobacco abuse presenting for cough with sore throat, nausea vomiting and diarrhea, constipation.  The patient reports that 3 days ago he began to have a nonproductive cough with associated sore throat but no ear pain, fevers or chills.  Yesterday he had 2 episodes of vomiting which have resolved.  3 days ago he had some loose nonbloody stool, now has not had a bowel movement in 2 days.  He has diffuse nonfocal abdominal pain without distention.  "I just don't feel good."  Past Medical History:  Diagnosis Date  . Asthma   . Bipolar disorder ([place])   . COPD (chronic obstructive pulmonary disease) ([place])   . Depression   . GERD (gastroesophageal reflux disease)   . HLD (hyperlipidemia)   . Hypertension   . Hypothyroid   . Irritable bowel syndrome (IBS)   . Schizophrenia [facility])     Patient Active Problem List   Diagnosis Date Noted  . GI bleeding 09/05/2015  . HLD (hyperlipidemia) 02/07/2015  . COPD (chronic obstructive pulmonary disease) ([place]) 02/07/2015  . Hyponatremia 02/07/2015  . Gastritis 02/07/2015  . Acute kidney injury ([place]) 11/20/2014  . ARF (acute renal failure) ([place]) 11/06/2014  . Hypokalemia 11/06/2014  . Hypotension 11/06/2014  . Cannabis use disorder, severe, dependence ([place]) 09/24/2014  . Undifferentiated schizophrenia ([place])   . Hypothyroidism 09/17/2014  . GERD (gastroesophageal reflux disease) 09/17/2014  . Tobacco use disorder 09/17/2014  . Non compliance w medication regimen 09/16/2014  . Asthma 09/16/2014  . HTN (hypertension) 09/16/2014  . Tardive dyskinesia 09/16/2014    Past Surgical  History:  Procedure Laterality Date  . BACK SURGERY    . CARPAL TUNNEL RELEASE Bilateral   . ESOPHAGOGASTRODUODENOSCOPY (EGD) WITH PROPOFOL N/A 09/06/2015   Procedure: ESOPHAGOGASTRODUODENOSCOPY (EGD) WITH PROPOFOL;  Surgeon: Hulen Luster, MD;  Location: [facility] ENDOSCOPY;  Service: Endoscopy;  Laterality: N/A;  . ROTATOR CUFF REPAIR     2007    Current Outpatient Rx  . Order #: [id number] Class: Print  . Order #: [id number] Class: Historical Med  . Order #: [id number] Class: Normal  . Order #: [id number] Class: Historical Med  . Order #: [id number] Class: Print  . Order #: [id number] Class: Historical Med  . Order #: [id number] Class: Normal  . Order #: [id number] Class: Historical Med  . Order #: [id number] Class: Print  . Order #: [id number] Class: Normal  . Order #: [id number] Class: Phone In  . Order #: [id number] Class: Normal  . Order #: [id number] Class: Historical Med  . Order #: [id number] Class: Historical Med  . Order #: [id number] Class: Print  . Order #: [id number] Class: Print  . Order #: [id number] Class: Print  . Order #: [id number] Class: Print  . Order #: [id number] Class: Print  . Order #: [id number] Class: Print  . Order #: [id number] Class: Print  . Order #: [id number] Class: Print  . Order #: [id number] Class: Print  . Order #: [id number] Class: Print  . Order #: [id number] Class: Print    Allergies Aspirin  Family History  Problem Relation Age of Onset  . Hypertension Father   . Cataracts Father   . Diabetes Sister   . Diabetes Brother   . Dementia Mother     Social History Social History   Tobacco Use  .  Smoking status: Current Every Day Smoker    Packs/day: 1.00    Types: Cigarettes  . Smokeless tobacco: Never Used  Substance Use Topics  . Alcohol use: No  . Drug use: Yes    Frequency: 1.0 times per week    Types: Marijuana    Comment: last smoked today    Review of Systems Constitutional: No fever/chills.  No lightheadedness or syncope.  Positive general malaise. Eyes: No visual  changes. ENT: No sore throat. No congestion or rhinorrhea. Cardiovascular: Denies chest pain. Denies palpitations. Respiratory: Denies shortness of breath.  No cough. Gastrointestinal: No abdominal pain.  No nausea, no vomiting.  No diarrhea.  No constipation. Genitourinary: Negative for dysuria. Musculoskeletal: Negative for back pain. Skin: Negative for rash. Neurological: Negative for headaches. No focal numbness, tingling or weakness.  Psychiatric:The patient is slightly disorganized in his thinking, but otherwise is clear speech and no psychiatric red flags.   ____________________________________________   PHYSICAL EXAM:  VITAL SIGNS: ED Triage Vitals  Enc Vitals Group     BP      Pulse      Resp      Temp      Temp src      SpO2      Weight      Height      Head Circumference      Peak Flow      Pain Score      Pain Loc      Pain Edu?      Excl. in [place]?     Constitutional: Alert and oriented.  Poor hygiene and disheveled appearing but nontoxic. Answers questions appropriately. Eyes: Conjunctivae are normal.  EOMI. No scleral icterus. Head: Atraumatic. Nose: No congestion/rhinnorhea. Mouth/Throat: Mucous membranes are very dry.  The patient does have some posterior pharyngeal erythema without tonsillar swelling or exudate.  The posterior palate is symmetric and the uvula is midline..  Neck: No stridor.  Supple.  No JVD.  No meningismus. Cardiovascular: Normal rate, regular rhythm. No murmurs, rubs or gallops.  Respiratory: Normal respiratory effort.  No accessory muscle use or retractions. Lungs CTAB.  No wheezes, rales or ronchi. Gastrointestinal: Soft, nontender and nondistended.  No guarding or rebound.  No peritoneal signs. Musculoskeletal: No LE edema.  Neurologic:  A&Ox3.  Speech is clear.  Face and smile are symmetric.  EOMI.  Moves all extremities well. Skin:  Skin is warm, dry and intact. No rash noted. Psychiatric: The patient has a bizarre affect but  clear speech and good insight.  ____________________________________________   LABS (all labs ordered are listed, but only abnormal results are displayed)  Labs Reviewed  CBC - Abnormal; Notable for the following components:      Result Value   RDW 14.7 (*)    All other components within normal limits  URINALYSIS, COMPLETE (UACMP) WITH MICROSCOPIC - Abnormal; Notable for the following components:   Color, Urine YELLOW (*)    APPearance HAZY (*)    Protein, ur 30 (*)    Bacteria, UA RARE (*)    Squamous Epithelial / LPF 0-5 (*)    All other components within normal limits  COMPREHENSIVE METABOLIC PANEL - Abnormal; Notable for the following components:   Glucose, Bld 126 (*)    Creatinine, Ser 1.50 (*)    GFR calc non Af Amer 50 (*)    GFR calc Af Amer 58 (*)    All other components within normal limits  GROUP A STREP BY PCR  URINE CULTURE  INFLUENZA PANEL BY PCR (TYPE A & B)  LIPASE, BLOOD   ____________________________________________  EKG  ED ECG REPORT I, Eula Listen, the attending physician, personally viewed and interpreted this ECG.   Date: 05/16/2017  EKG Time: 1226  Rate: 96  Rhythm: normal sinus rhythm; RBBB  Axis: normal  Intervals:none  ST&T Change: No STEMI  ____________________________________________  RADIOLOGY  Dg Chest 2 View  Result Date: 05/16/2017 CLINICAL DATA:  Cough EXAM: CHEST  2 VIEW COMPARISON:  12/19/2016 FINDINGS: Heart size and vascularity normal. Negative for infiltrate or effusion. Mild scarring right upper lobe. Negative for mass lesion. IMPRESSION: No active cardiopulmonary disease. Electronically Signed   By: Franchot Gallo M.D.   On: 05/16/2017 13:26   Dg Abdomen 1 View  Result Date: 05/16/2017 CLINICAL DATA:  Nausea vomiting EXAM: ABDOMEN - 1 VIEW COMPARISON:  CT 09/05/2015 FINDINGS: Normal bowel gas pattern. Cholecystectomy clips. Lumbar laminectomy L5-S1 on the left. Ventral hernia with mesh in the right lower  quadrant. IMPRESSION: No acute abnormality. Electronically Signed   By: Franchot Gallo M.D.   On: 05/16/2017 13:27    ____________________________________________   PROCEDURES  Procedure(s) performed: None  Procedures  Critical Care performed: No ____________________________________________   INITIAL IMPRESSION / ASSESSMENT AND PLAN / ED COURSE  Pertinent labs & imaging results that were available during my care of the patient were reviewed by me and considered in my medical decision making (see chart for details).  57 y.o. male presenting with nausea vomiting and diarrhea, cough and sore throat.  Overall, the patient is hemodynamically stable but he does appear significantly dehydrated with very dry mucous membranes.  We will treat him for his nausea and give him intravenous fluids.  We will check him for influenza, chest x-ray to rule out pneumonia, and evaluate him for any electrolyte disturbance.  At this time, his abdomen is soft and nontender I do not suspect an acute intra-abdominal surgical pathology.  Plan reevaluation for final disposition.  ----------------------------------------- 2:55 PM on 05/16/2017 -----------------------------------------  The patient's clinical course in the emergency department has been reassuring.  He has continued to remain hemodynamically stable and afebrile.  While he has been here, he has started to feel much better and is able to tolerate liquids and is asking for food.  He has not had any further vomiting or diarrhea while here.  His workup does show some bacteriuria with red blood cells and given his symptoms, we will treat him for 3 days with antibiotics.  He has received his first dose here.  His influenza and strep testing are negative.  His white blood cell count is normal and his electrolytes are reassuring.  Does have some mild renal insufficiency with a creatinine of 1.5 which is a bump from his baseline.  I have treated him with  intravenous fluids, and he will continue oral hydration with creatinine recheck at home.  The patient's chest x-ray and abdominal x-rays are reassuring.  At this time, the patient is safe for discharge home.  He understands return precautions as well as follow-up instructions.  I have reviewed the patient's medical chart.  ____________________________________________  FINAL CLINICAL IMPRESSION(S) / ED DIAGNOSES  Final diagnoses:  Acute cystitis without hematuria  Nausea vomiting and diarrhea  Constipation, unspecified constipation type  Acute renal insufficiency         NEW MEDICATIONS STARTED DURING THIS VISIT:  New Prescriptions   LOPERAMIDE (IMODIUM A-D) 2 MG TABLET    Take 1 tablet (2  mg total) by mouth 4 (four) times daily as needed for diarrhea or loose stools.   ONDANSETRON (ZOFRAN ODT) 4 MG DISINTEGRATING TABLET    Take 1 tablet (4 mg total) by mouth every 8 (eight) hours as needed for nausea or vomiting.   SULFAMETHOXAZOLE-TRIMETHOPRIM (BACTRIM) 400-80 MG TABLET    Take 1 tablet by mouth 2 (two) times daily.      Eula Listen, MD 05/16/17 [phone]

## 2017-05-16 NOTE — ED Notes (Signed)
Rn received cab voucher, from charge.

## 2017-05-16 NOTE — ED Notes (Signed)
Patient transported to X-ray 

## 2017-05-16 NOTE — ED Notes (Signed)
Urine sent at this time.

## 2017-05-16 NOTE — ED Notes (Signed)
Pt was given sandwich tray and is awaiting cab home.

## 2017-05-18 LAB — URINE CULTURE: Culture: <10000 — AB

## 2017-06-07 ENCOUNTER — Encounter: Payer: Self-pay | Admitting: Nurse Practitioner

## 2017-06-07 ENCOUNTER — Ambulatory Visit (INDEPENDENT_AMBULATORY_CARE_PROVIDER_SITE_OTHER): Payer: Medicaid Other | Admitting: Nurse Practitioner

## 2017-06-07 VITALS — BP 128/86 | HR 91 | Resp 16 | Ht 67.0 in | Wt 153.0 lb

## 2017-06-07 DIAGNOSIS — R5383 Other fatigue: Secondary | ICD-10-CM | POA: Diagnosis not present

## 2017-06-07 DIAGNOSIS — F17210 Nicotine dependence, cigarettes, uncomplicated: Secondary | ICD-10-CM

## 2017-06-07 DIAGNOSIS — R1032 Left lower quadrant pain: Secondary | ICD-10-CM

## 2017-06-07 MED ORDER — AMOXICILLIN-POT CLAVULANATE 875-125 MG PO TABS: 1.0000 | ORAL_TABLET | Freq: Two times a day (BID) | ORAL | 0 refills | Status: DC
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
## 2017-06-13 ENCOUNTER — Other Ambulatory Visit: Payer: Self-pay

## 2017-06-13 MED ORDER — DOCUSATE SODIUM 100 MG PO CAPS: 100.0000 mg | ORAL_CAPSULE | Freq: Every day | ORAL | 3 refills | Status: DC

## 2017-06-18 ENCOUNTER — Other Ambulatory Visit: Payer: Self-pay

## 2017-06-25 ENCOUNTER — Other Ambulatory Visit: Payer: Self-pay

## 2017-06-25 IMAGING — CT CT ABD-PELV W/ CM
2 of 5 series · 16 of 46 positions shown, 18 images · IV contrast (iopamidol)
Comparison: CT dated 02/07/2015

CLINICAL DATA: 54-year-old male with hematemesis, nausea vomiting
and diarrhea.

EXAM:
CT ABDOMEN AND PELVIS WITH CONTRAST
TECHNIQUE: Multidetector CT imaging of the abdomen and pelvis was performed
using the standard protocol following bolus administration of
intravenous contrast.
CONTRAST:  100mL VCXN35-ZII IOPAMIDOL ([id number]) INJECTION 61%

[Series 2: routine abd pel with · axial · 0.68mm/px · z∈[-498,-93]mm · 13 of 91 slices shown, 15 images]
[im 5/91  soft-tissue]
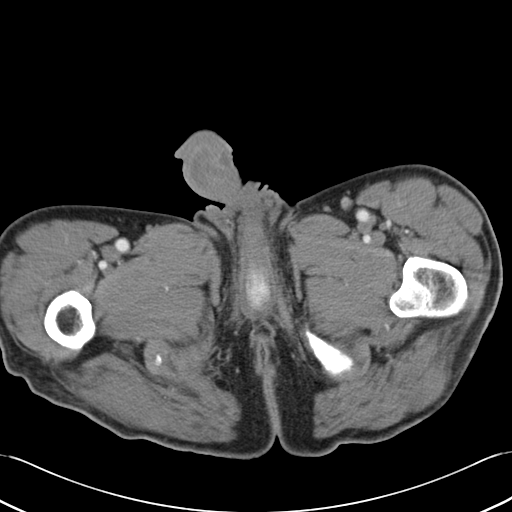
[im 5/91  bone]
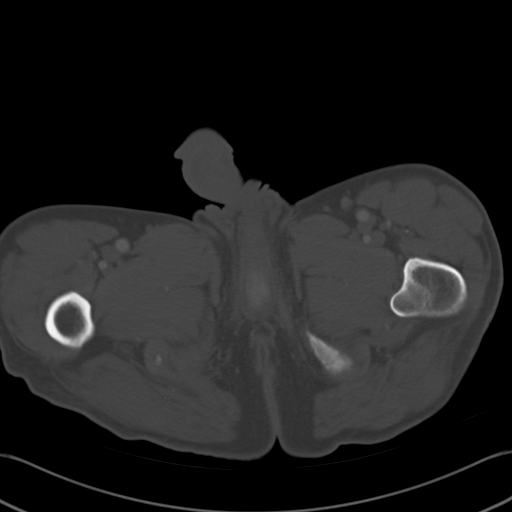
[im 15/91  soft-tissue]
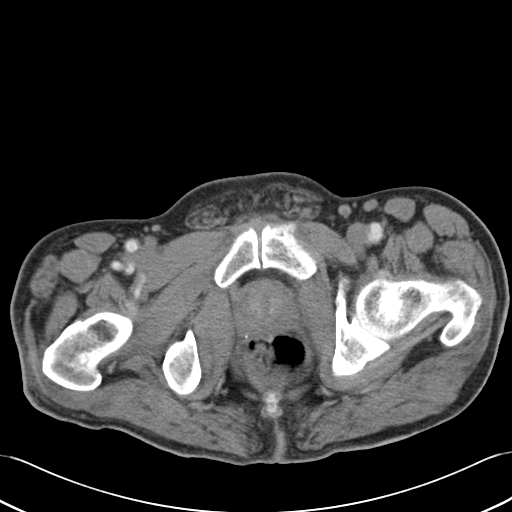
[im 19/91  soft-tissue]
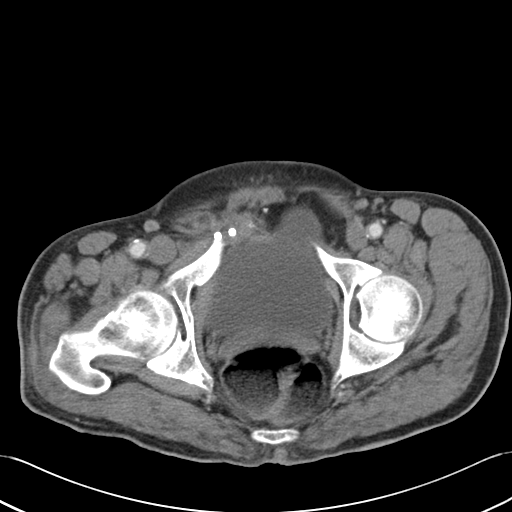
[im 24/91  soft-tissue]
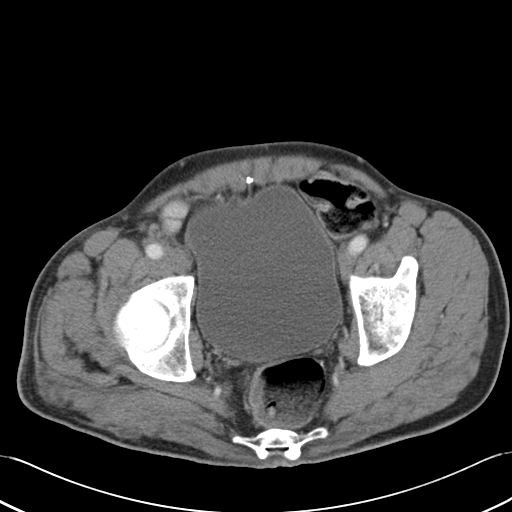
[im 34/91  soft-tissue]
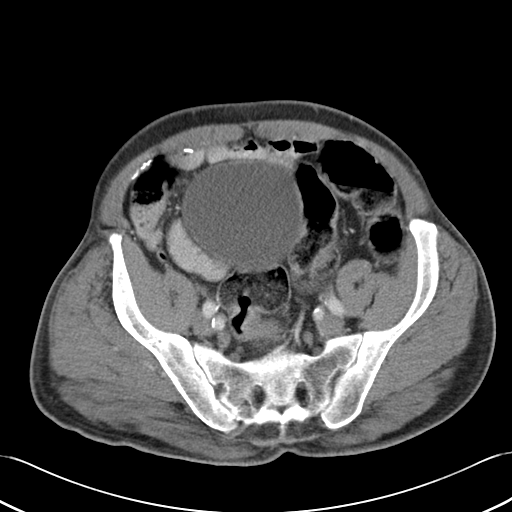
[im 38/91  soft-tissue]
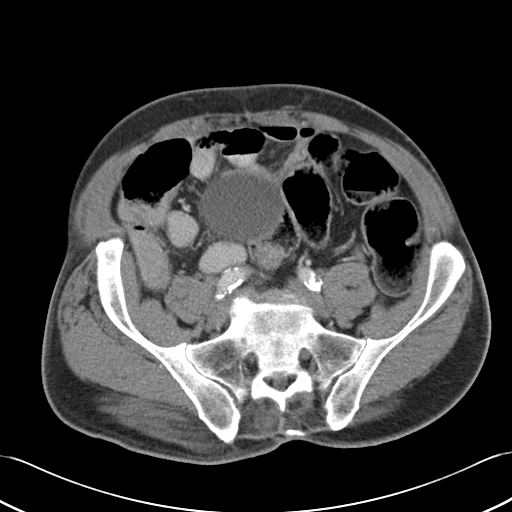
[im 48/91  soft-tissue]
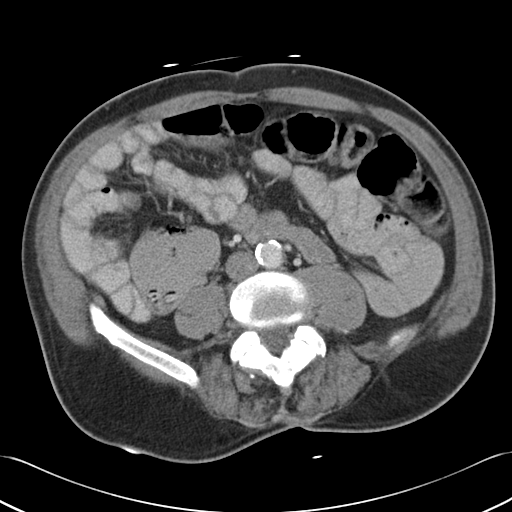
[im 53/91  soft-tissue]
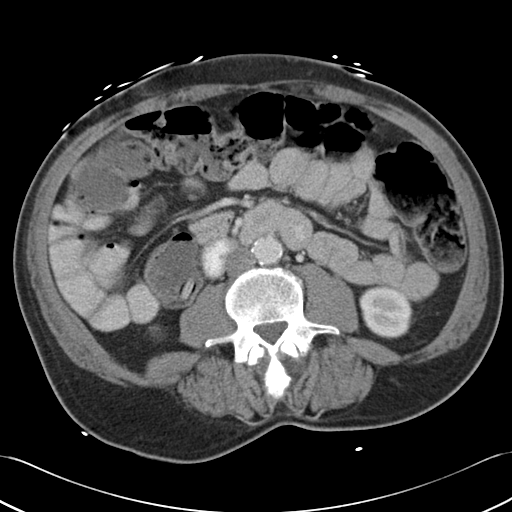
[im 57/91  soft-tissue]
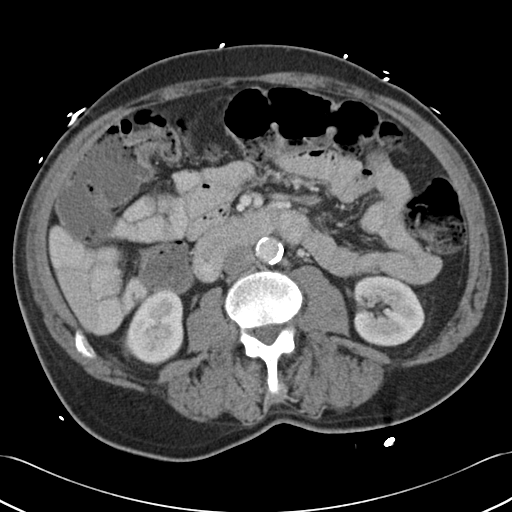
[im 57/91  bone]
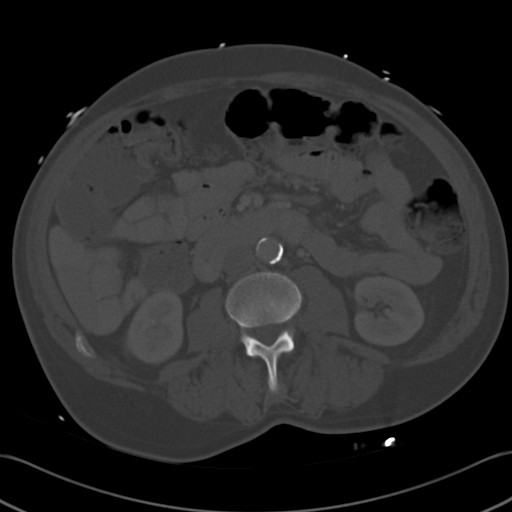
[im 67/91  soft-tissue]
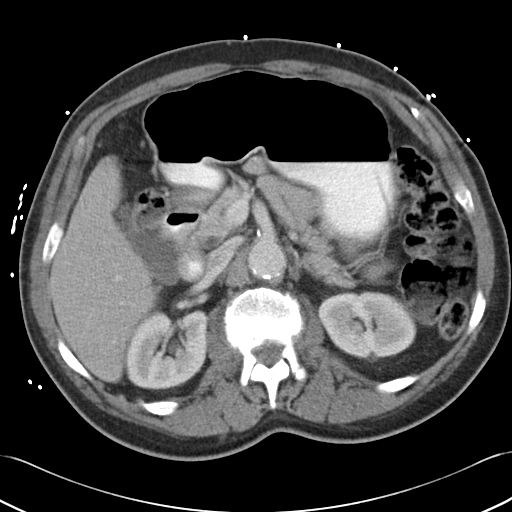
[im 72/91  soft-tissue]
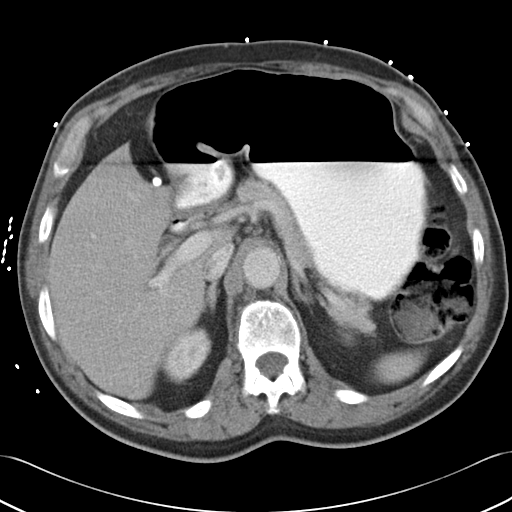
[im 76/91  soft-tissue]
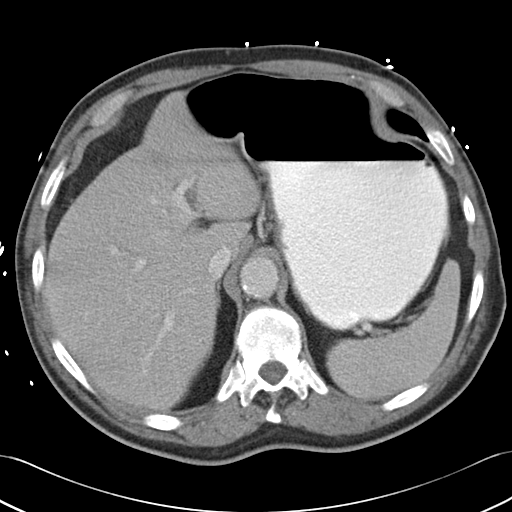
[im 86/91  soft-tissue]
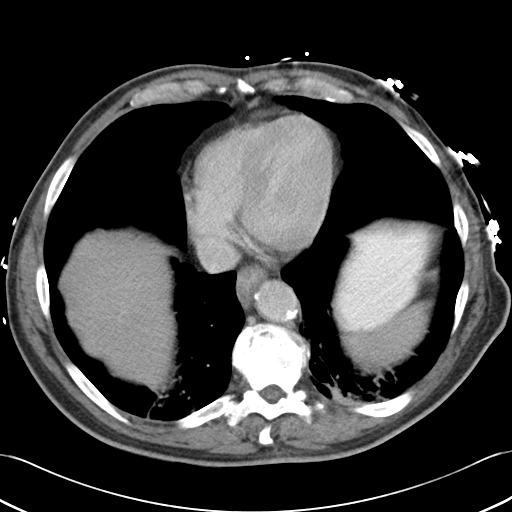

[Series 5: cor routine abd pel with · coronal · 0.74mm/px · 3 of 135 slices shown]
[im 45/135  soft-tissue]
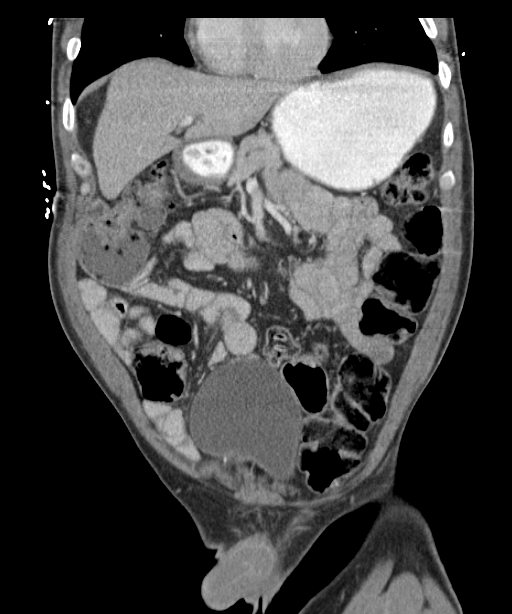
[im 60/135  soft-tissue]
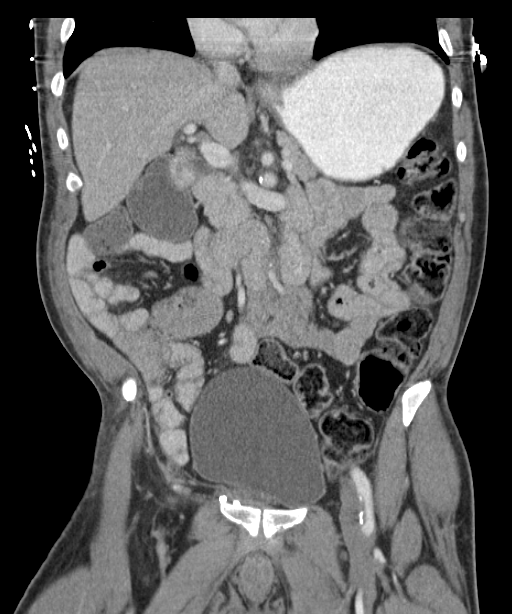
[im 75/135  soft-tissue]
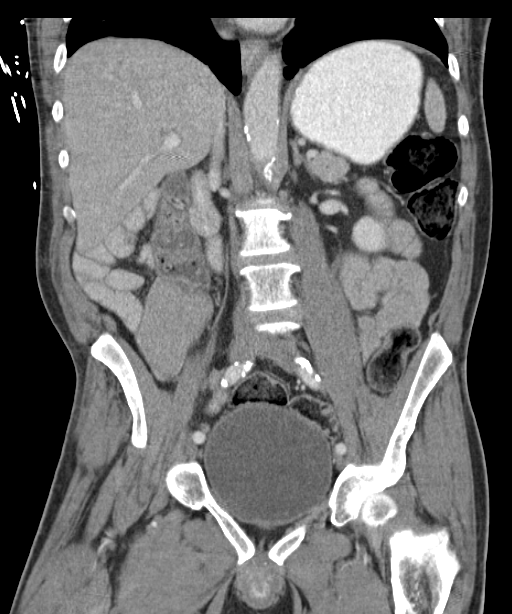

[16 of 46 positions shown; findings below may reference images not displayed]

FINDINGS: Bibasilar atelectasis/ scarring. Pneumonia is less likely. Clinical
correlation is recommended. No intra-abdominal free air. No free
fluid.

Cholecystectomy. The liver, pancreas, spleen, adrenal glands,
kidneys, visualized ureters, and urinary bladder appear
unremarkable. A subcentimeter hypodense lesion in the posterior
cortex of the left kidney is too small to characterize but appears
grossly stable compared to the prior study and likely represents a
cyst. The prostate and seminal vesicles are grossly unremarkable.

Oral contrast opacifies the stomach and multiple loops of small
bowel. There is no evidence of bowel obstruction or active
inflammation. Normal appendix.

There is aortoiliac atherosclerotic disease. The origins of the
celiac axis, SMA, IMA and the origins of the renal arteries appear
patent. No portal venous gas identified. The splenic vein, SMV, main
portal vein appear patent. There is no adenopathy. Right inguinal
hernia repair mesh. The abdominal wall appears unremarkable. There
is degenerative changes of the spine. There bilateral L5 pars
defects. No acute fracture.
IMPRESSION: No acute intra-abdominal or pelvic pathology.

## 2017-07-02 ENCOUNTER — Emergency Department
Admission: EM | Admit: 2017-07-02 | Discharge: 2017-07-03 | Disposition: A | Discharge status: Other Acute Inpatient Hospital | Payer: Medicaid Other | Attending: Emergency Medicine | Admitting: Emergency Medicine

## 2017-07-02 ENCOUNTER — Other Ambulatory Visit: Payer: Self-pay

## 2017-07-02 DIAGNOSIS — Z79899 Other long term (current) drug therapy: Secondary | ICD-10-CM | POA: Insufficient documentation

## 2017-07-02 DIAGNOSIS — I10 Essential (primary) hypertension: Secondary | ICD-10-CM | POA: Diagnosis present

## 2017-07-02 DIAGNOSIS — F17210 Nicotine dependence, cigarettes, uncomplicated: Secondary | ICD-10-CM | POA: Insufficient documentation

## 2017-07-02 DIAGNOSIS — J45909 Unspecified asthma, uncomplicated: Secondary | ICD-10-CM | POA: Diagnosis not present

## 2017-07-02 DIAGNOSIS — E039 Hypothyroidism, unspecified: Secondary | ICD-10-CM | POA: Diagnosis present

## 2017-07-02 DIAGNOSIS — R45851 Suicidal ideations: Secondary | ICD-10-CM | POA: Insufficient documentation

## 2017-07-02 DIAGNOSIS — F329 Major depressive disorder, single episode, unspecified: Secondary | ICD-10-CM | POA: Diagnosis not present

## 2017-07-02 DIAGNOSIS — K219 Gastro-esophageal reflux disease without esophagitis: Secondary | ICD-10-CM | POA: Diagnosis present

## 2017-07-02 DIAGNOSIS — J449 Chronic obstructive pulmonary disease, unspecified: Secondary | ICD-10-CM | POA: Diagnosis not present

## 2017-07-02 DIAGNOSIS — F1220 Cannabis dependence, uncomplicated: Secondary | ICD-10-CM | POA: Diagnosis present

## 2017-07-02 DIAGNOSIS — F203 Undifferentiated schizophrenia: Secondary | ICD-10-CM

## 2017-07-02 DIAGNOSIS — G2401 Drug induced subacute dyskinesia: Secondary | ICD-10-CM | POA: Diagnosis present

## 2017-07-02 LAB — URINE DRUG SCREEN, QUALITATIVE (ARMC ONLY)
Amphetamines, Ur Screen: NOT DETECTED
BARBITURATES, UR SCREEN: NOT DETECTED
Benzodiazepine, Ur Scrn: NOT DETECTED
CANNABINOID 50 NG, UR ~~LOC~~: POSITIVE — AB
COCAINE METABOLITE, UR ~~LOC~~: NOT DETECTED
MDMA (Ecstasy)Ur Screen: NOT DETECTED
METHADONE SCREEN, URINE: NOT DETECTED
Opiate, Ur Screen: NOT DETECTED
Phencyclidine (PCP) Ur S: NOT DETECTED
Tricyclic, Ur Screen: POSITIVE — AB

## 2017-07-02 LAB — COMPREHENSIVE METABOLIC PANEL
ALBUMIN: 4.1 g/dL (ref 3.5–5.0)
ALK PHOS: 93 U/L (ref 38–126)
ALT: 23 U/L (ref 17–63)
AST: 29 U/L (ref 15–41)
Anion gap: 10 (ref 5–15)
BILIRUBIN TOTAL: 0.6 mg/dL (ref 0.3–1.2)
BUN: 14 mg/dL (ref 6–20)
CALCIUM: 9.3 mg/dL (ref 8.9–10.3)
CO2: 23 mmol/L (ref 22–32)
CREATININE: 0.85 mg/dL (ref 0.61–1.24)
Chloride: 106 mmol/L (ref 101–111)
GFR calc Af Amer: >60 mL/min (ref >60)
GFR calc non Af Amer: >60 mL/min (ref >60)
GLUCOSE: 104 mg/dL — AB (ref 65–99)
Potassium: 3.7 mmol/L (ref 3.5–5.1)
SODIUM: 139 mmol/L (ref 135–145)
Total Protein: 7.7 g/dL (ref 6.5–8.1)

## 2017-07-02 LAB — CBC
HEMATOCRIT: 39.6 % — AB (ref 40.0–52.0)
HEMOGLOBIN: 13.3 g/dL (ref 13.0–18.0)
MCH: 29.8 pg (ref 26.0–34.0)
MCHC: 33.6 g/dL (ref 32.0–36.0)
MCV: 88.6 fL (ref 80.0–100.0)
Platelets: 216 10*3/uL (ref 150–440)
RBC: 4.47 MIL/uL (ref 4.40–5.90)
RDW: 16 % — ABNORMAL HIGH (ref 11.5–14.5)
WBC: 8.3 10*3/uL (ref 3.8–10.6)

## 2017-07-02 LAB — ETHANOL: Alcohol, Ethyl (B): <10 mg/dL (ref <10)

## 2017-07-02 LAB — SALICYLATE LEVEL: Salicylate Lvl: <7 mg/dL (ref 2.8–30.0)

## 2017-07-02 LAB — ACETAMINOPHEN LEVEL: Acetaminophen (Tylenol), Serum: <10 ug/mL — ABNORMAL LOW (ref 10–30)

## 2017-07-02 MED ORDER — PANTOPRAZOLE SODIUM 40 MG PO TBEC
40.0000 mg | DELAYED_RELEASE_TABLET | Freq: Every day | ORAL | Status: DC
  Administered 2017-07-03: 40 mg via ORAL
  Filled 2017-07-02: qty 1

## 2017-07-02 MED ORDER — VENLAFAXINE HCL 25 MG PO TABS
50.0000 mg | ORAL_TABLET | Freq: Two times a day (BID) | ORAL | Status: DC
  Administered 2017-07-03: 50 mg via ORAL
  Filled 2017-07-02 (×2): qty 1

## 2017-07-02 MED ORDER — ALBUTEROL SULFATE (2.5 MG/3ML) 0.083% IN NEBU: 2.5000 mg | INHALATION_SOLUTION | Freq: Four times a day (QID) | RESPIRATORY_TRACT | Status: DC | PRN

## 2017-07-02 MED ORDER — LEVOTHYROXINE SODIUM 50 MCG PO TABS
100.0000 ug | ORAL_TABLET | Freq: Every day | ORAL | Status: DC
  Administered 2017-07-03: 100 ug via ORAL
  Filled 2017-07-02: qty 2

## 2017-07-02 MED ORDER — LISINOPRIL 10 MG PO TABS
30.0000 mg | ORAL_TABLET | Freq: Every day | ORAL | Status: DC
  Administered 2017-07-03: 30 mg via ORAL
  Filled 2017-07-02: qty 3

## 2017-07-02 MED ORDER — QUETIAPINE FUMARATE 300 MG PO TABS
300.0000 mg | ORAL_TABLET | Freq: Every day | ORAL | Status: DC
  Administered 2017-07-03: 300 mg via ORAL
  Filled 2017-07-02: qty 1

## 2017-07-02 MED ORDER — AMITRIPTYLINE HCL 50 MG PO TABS
50.0000 mg | ORAL_TABLET | Freq: Every day | ORAL | Status: DC
  Administered 2017-07-03: 50 mg via ORAL
  Filled 2017-07-02: qty 1

## 2017-07-02 MED ORDER — MELOXICAM 15 MG PO TABS
15.0000 mg | ORAL_TABLET | Freq: Every day | ORAL | Status: DC
  Administered 2017-07-03: 15 mg via ORAL
  Filled 2017-07-02: qty 1

## 2017-07-02 MED ORDER — DOCUSATE SODIUM 100 MG PO CAPS
100.0000 mg | ORAL_CAPSULE | Freq: Two times a day (BID) | ORAL | Status: DC
  Administered 2017-07-03 (×2): 100 mg via ORAL
  Filled 2017-07-02 (×2): qty 1
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
## 2017-07-02 NOTE — BH Assessment (Signed)
Per Dr. Weber Cooks, patient meets inpatient criteria. Patient to be admitted to [facility] BMU when bed becomes available, pending discharges.

## 2017-07-02 NOTE — ED Notes (Addendum)
Pt dressed out into appropriate behavioral health clothing with this tech and Brett,NT in the rm. Pt belongings consist of a two red lighters, a white lighter, a black lighter, one dime, three nickels, two pennies, a green hat, a pair of black gloves a gray glasses case, a brown wallet, a silver cell phone, a cell phone charger camo jacket, blue jeans, a green sweater, gray shoes, white socks, blue shirt, red shirt, brown belt and blue boxers. Pt very frustrated white dressing out throwing clothes around triage rm. Pt stating "This is bullshit. Why do you have to take my clothes. This is bull fucking shit." Pt continuing to use foul language while dressing out.  Pt stating "I hate these fucking clothes just like I fucking hate people."

## 2017-07-02 NOTE — ED Notes (Signed)
Pt stating "I am very pissed off about a lot of things. I am homeless. I hate to be away from my dog for this crap again." Pt does not not have urge to urinate at this time. Given cup for when is able to once in back.
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
## 2017-07-02 NOTE — ED Provider Notes (Signed)
[facility] Emergency Department Provider Note ____________________________________________   First MD Initiated Contact with Patient 07/02/17 1716     (approximate)  I have reviewed the triage vital signs and the nursing notes.   HISTORY  Chief Complaint Suicidal    HPI Christopher Mason is a 57 y.o. male with past medical history as noted below including schizophrenia and bipolar disorder who presents with suicidal ideation, gradual onset over the last several months, worse in the last several days since he was kicked out of the hotel he was staying in.  The patient states that he would plan to try to smother himself, although he states he has not attempted suicide.  The patient denies any other acute psychiatric symptoms or any medical complaints at this time.  The patient endorses marijuana use, but denies alcohol or other drugs.  He states "I hate living, and I hate life."  He complains that police made him throw out a marijuana joint.   Past Medical History:  Diagnosis Date  . Asthma   . Bipolar disorder ([place])   . COPD (chronic obstructive pulmonary disease) ([place])   . Depression   . GERD (gastroesophageal reflux disease)   . HLD (hyperlipidemia)   . Hypertension   . Hypothyroid   . Irritable bowel syndrome (IBS)   . Schizophrenia [facility])     Patient Active Problem List   Diagnosis Date Noted  . GI bleeding 09/05/2015  . HLD (hyperlipidemia) 02/07/2015  . COPD (chronic obstructive pulmonary disease) ([place]) 02/07/2015  . Hyponatremia 02/07/2015  . Gastritis 02/07/2015  . Acute kidney injury ([place]) 11/20/2014  . ARF (acute renal failure) ([place]) 11/06/2014  . Hypokalemia 11/06/2014  . Hypotension 11/06/2014  . Cannabis use disorder, severe, dependence ([place]) 09/24/2014  . Undifferentiated schizophrenia ([place])   . Hypothyroidism 09/17/2014  . GERD (gastroesophageal reflux disease) 09/17/2014  . Tobacco use disorder 09/17/2014  . Non compliance w  medication regimen 09/16/2014  . Asthma 09/16/2014  . HTN (hypertension) 09/16/2014  . Tardive dyskinesia 09/16/2014    Past Surgical History:  Procedure Laterality Date  . BACK SURGERY    . CARPAL TUNNEL RELEASE Bilateral   . ESOPHAGOGASTRODUODENOSCOPY (EGD) WITH PROPOFOL N/A 09/06/2015   Procedure: ESOPHAGOGASTRODUODENOSCOPY (EGD) WITH PROPOFOL;  Surgeon: Hulen Luster, MD;  Location: [facility] ENDOSCOPY;  Service: Endoscopy;  Laterality: N/A;  . ROTATOR CUFF REPAIR     2007    Prior to Admission medications   Medication Sig Start Date End Date Taking? Authorizing Provider  albuterol (PROVENTIL HFA;VENTOLIN HFA) 108 (90 BASE) MCG/ACT inhaler Inhale 2 puffs into the lungs every 4 (four) hours as needed for wheezing or shortness of breath. 09/25/14   Pucilowska, Jolanta B, MD  amitriptyline (ELAVIL) 50 MG tablet Take 100 mg by mouth at bedtime.    [provider]  amLODipine (NORVASC) 10 MG tablet Take 1 tablet (10 mg total) by mouth daily. 05/15/17   Ronnell Freshwater, NP  amoxicillin-clavulanate (AUGMENTIN) 875-125 MG tablet Take 1 tablet by mouth 2 (two) times daily. 06/07/17   Ronnell Freshwater, NP  benzonatate (TESSALON PERLES) 100 MG capsule Take 1 capsule (100 mg total) by mouth every 6 (six) hours as needed for cough. Patient not taking: Reported on 05/16/2017 01/25/16   Loney Hering, MD  diclofenac sodium (VOLTAREN) 1 % GEL Apply 2 g topically 3 (three) times daily. Patient not taking: Reported on 05/16/2017 06/01/16   Nance Pear, MD  docusate sodium (COLACE) 100 MG capsule Take 1  capsule (100 mg total) by mouth daily. 06/13/17   Ronnell Freshwater, NP  doxycycline (VIBRAMYCIN) 100 MG capsule Take 1 capsule (100 mg total) by mouth 2 (two) times daily. Patient not taking: Reported on 05/16/2017 12/19/16   Carrie Mew, MD  ibuprofen (ADVIL,MOTRIN) 800 MG tablet Take 1 tablet (800 mg total) by mouth every 8 (eight) hours as needed. Patient not taking: Reported on 05/16/2017  01/25/16   Loney Hering, MD  loperamide (IMODIUM A-D) 2 MG tablet Take 1 tablet (2 mg total) by mouth 4 (four) times daily as needed for diarrhea or loose stools. Patient not taking: Reported on 06/07/2017 05/16/17   Eula Listen, MD  meloxicam (MOBIC) 15 MG tablet Take 1 tablet (15 mg total) by mouth daily. 12/24/16   Lavonia Drafts, MD  methocarbamol (ROBAXIN) 500 MG tablet Take 1 tablet (500 mg total) by mouth 4 (four) times daily. 11/19/16   Cuthriell, Charline Bills, PA-C  omeprazole (PRILOSEC) 20 MG capsule Take 1 capsule (20 mg total) by mouth 2 (two) times daily before a meal. 09/06/15   Leslye Peer, Richard, MD  ondansetron (ZOFRAN) 4 MG tablet Take 1 tablet (4 mg total) by mouth daily as needed for nausea or vomiting. 04/25/16   Lavonia Drafts, MD  pravastatin (PRAVACHOL) 10 MG tablet Take 1 tablet (10 mg total) by mouth daily. 05/15/17   Ronnell Freshwater, NP  QUEtiapine (SEROQUEL) 25 MG tablet Take 25 mg by mouth 3 (three) times daily.    [provider]  QUEtiapine (SEROQUEL) 300 MG tablet Take 1 tablet (300 mg total) by mouth at bedtime. 09/29/14   Pucilowska, Wardell Honour, MD  solifenacin (VESICARE) 10 MG tablet Take 1 tab by mouth nightly for overactive bladder 05/15/17   Ronnell Freshwater, NP  sucralfate (CARAFATE) 1 g tablet Take 1 tablet (1 g total) by mouth 3 (three) times daily. 04/06/17   Ronnell Freshwater, NP  tamsulosin (FLOMAX) 0.4 MG CAPS capsule Take by mouth.    [provider]  thyroid (ARMOUR) 60 MG tablet Take 120 mg by mouth daily before breakfast.    [provider]  venlafaxine (EFFEXOR) 50 MG tablet Take 50 mg by mouth 2 (two) times daily.    [provider]    Allergies Aspirin  Family History  Problem Relation Age of Onset  . Hypertension Father   . Cataracts Father   . Diabetes Sister   . Diabetes Brother   . Dementia Mother     Social History Social History   Tobacco Use  . Smoking status: Current Every Day Smoker     Packs/day: 1.00    Types: Cigarettes  . Smokeless tobacco: Never Used  Substance Use Topics  . Alcohol use: No  . Drug use: Yes    Frequency: 1.0 times per week    Types: Marijuana    Comment: last smoked today    Review of Systems  Constitutional: No fever. Eyes: No redness. ENT: No sore throat. Cardiovascular: Denies chest pain. Respiratory: Denies shortness of breath. Gastrointestinal: No vomiting.   Genitourinary: Negative for flank pain.  Musculoskeletal: Negative for back pain. Skin: Negative for rash. Neurological: Negative for headache.   ____________________________________________   PHYSICAL EXAM:  VITAL SIGNS: ED Triage Vitals  Enc Vitals Group     BP 07/02/17 1649 (!) 134/92     Pulse Rate 07/02/17 1649 85     Resp 07/02/17 1649 20     Temp 07/02/17 1649 98.2 F (36.8 C)  Temp Source 07/02/17 1649 Oral     SpO2 07/02/17 1649 96 %     Weight 07/02/17 1650 153 lb (69.4 kg)     Height 07/02/17 1650 5\' 7"  (1.702 m)     Head Circumference --      Peak Flow --      Pain Score 07/02/17 1653 10     Pain Loc --      Pain Edu? --      Excl. in [place]? --     Constitutional: Alert and oriented.  To be comfortable.. Eyes: Conjunctivae are normal.  Head: Atraumatic. Nose: No congestion/rhinnorhea. Mouth/Throat: Mucous membranes are moist.   Neck: Normal range of motion.  Cardiovascular:  Good peripheral circulation. Respiratory: Normal respiratory effort. Gastrointestinal: No distention.  Musculoskeletal:   Extremities warm and well perfused.  Neurologic: Motor intact in all extremities. Skin:  Skin is warm and dry. No rash noted. Psychiatric: Mildly agitated but redirectable.  ____________________________________________   LABS (all labs ordered are listed, but only abnormal results are displayed)  Labs Reviewed  COMPREHENSIVE METABOLIC PANEL - Abnormal; Notable for the following components:      Result Value   Glucose, Bld 104 (*)    All other  components within normal limits  ACETAMINOPHEN LEVEL - Abnormal; Notable for the following components:   Acetaminophen (Tylenol), Serum <10 (*)    All other components within normal limits  CBC - Abnormal; Notable for the following components:   HCT 39.6 (*)    RDW 16.0 (*)    All other components within normal limits  ETHANOL  SALICYLATE LEVEL  URINE DRUG SCREEN, QUALITATIVE ([facility] ONLY)   ____________________________________________  EKG  ED ECG REPORT I, Arta Silence, the attending physician, personally viewed and interpreted this ECG.  Date: 07/02/2017 EKG Time: 1828 Rate: 73 Rhythm: normal sinus rhythm QRS Axis: normal Intervals: RBBB ST/T Wave abnormalities: normal Narrative Interpretation: no evidence of acute ischemia    ____________________________________________  RADIOLOGY    ____________________________________________   PROCEDURES  Procedure(s) performed: No  Procedures  Critical Care performed: No ____________________________________________   INITIAL IMPRESSION / ASSESSMENT AND PLAN / ED COURSE  Pertinent labs & imaging results that were available during my care of the patient were reviewed by me and considered in my medical decision making (see chart for details).  57 year old male with past medical history as noted above presents for suicidal ideation which is subacute but exacerbated recently when he thrown out of the place that he was staying.  The patient endorses marijuana use but denies any other drugs.  He is not made a suicidal attempt.  He states that he is not trying to kill himself right now.  On exam, the patient is comfortable appearing, vital signs are normal, there is no evidence of trauma, neuro exam is nonfocal, and he is mildly agitated but redirectable.  Plan: Psych consult, TTS consult, labs for medical clearance, and reassess.    ----------------------------------------- 6:40 PM on  07/02/2017 -----------------------------------------  I consulted with Dr. Weber Cooks who evaluated the patient, and he recommends admission.  Lab workup is unremarkable.  ____________________________________________   FINAL CLINICAL IMPRESSION(S) / ED DIAGNOSES  Final diagnoses:  Suicidal ideation      NEW MEDICATIONS STARTED DURING THIS VISIT:  New Prescriptions   No medications on file     Note:  This document was prepared using Dragon voice recognition software and may include unintentional dictation errors.    Arta Silence, MD 07/02/17 1840

## 2017-07-02 NOTE — ED Notes (Signed)
Pt presents with SI. States he is homeless and "no one cares." States he was evicted from his apartment because he was smoking cigarettes and marijuana. "I don't understand all the stupid rules in this state. I want to go home. I hate my family and they hate me and treat me like shit. I hate this state and I have had nothing but problems in this state." (moved here in 1986 from [place]) Pt states "I am very depressed. I lost everything."
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
## 2017-07-02 NOTE — ED Notes (Signed)
PT  PUT UNDER  IVC  PAPERS  PER  DR  SIADECKI MD  INFORMED  ODS  OFFICER  AND  MARY  RN  /  PENDING  CONSULT
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
## 2017-07-02 NOTE — ED Notes (Signed)
Pt's medications taken to pharmacy. Receipt on chart

## 2017-07-02 NOTE — ED Triage Notes (Signed)
First Nurse Note:  Arrives with Mental Health worker for ED evaluation of SI.  Worker states patient is homeless, which she feels may have precipitated SI.  Patient tearful.

## 2017-07-03 ENCOUNTER — Other Ambulatory Visit: Payer: Self-pay

## 2017-07-03 ENCOUNTER — Inpatient Hospital Stay
Admission: AD | Admit: 2017-07-03 | Discharge: 2017-07-23 | DRG: 885 | Disposition: A | Discharge status: Home / Self Care | Payer: Medicaid Other | Attending: Psychiatry | Admitting: Psychiatry

## 2017-07-03 DIAGNOSIS — F17210 Nicotine dependence, cigarettes, uncomplicated: Secondary | ICD-10-CM | POA: Diagnosis present

## 2017-07-03 DIAGNOSIS — K219 Gastro-esophageal reflux disease without esophagitis: Secondary | ICD-10-CM | POA: Diagnosis present

## 2017-07-03 DIAGNOSIS — J449 Chronic obstructive pulmonary disease, unspecified: Secondary | ICD-10-CM | POA: Diagnosis present

## 2017-07-03 DIAGNOSIS — F203 Undifferentiated schizophrenia: Secondary | ICD-10-CM | POA: Diagnosis present

## 2017-07-03 DIAGNOSIS — F200 Paranoid schizophrenia: Secondary | ICD-10-CM | POA: Diagnosis not present

## 2017-07-03 DIAGNOSIS — Z79899 Other long term (current) drug therapy: Secondary | ICD-10-CM

## 2017-07-03 DIAGNOSIS — Z590 Homelessness: Secondary | ICD-10-CM | POA: Diagnosis not present

## 2017-07-03 DIAGNOSIS — Z598 Other problems related to housing and economic circumstances: Secondary | ICD-10-CM | POA: Diagnosis not present

## 2017-07-03 DIAGNOSIS — N401 Enlarged prostate with lower urinary tract symptoms: Secondary | ICD-10-CM | POA: Diagnosis present

## 2017-07-03 DIAGNOSIS — G2401 Drug induced subacute dyskinesia: Secondary | ICD-10-CM | POA: Diagnosis present

## 2017-07-03 DIAGNOSIS — K589 Irritable bowel syndrome without diarrhea: Secondary | ICD-10-CM | POA: Diagnosis present

## 2017-07-03 DIAGNOSIS — Z886 Allergy status to analgesic agent status: Secondary | ICD-10-CM

## 2017-07-03 DIAGNOSIS — G4700 Insomnia, unspecified: Secondary | ICD-10-CM | POA: Diagnosis present

## 2017-07-03 DIAGNOSIS — Z79890 Hormone replacement therapy (postmenopausal): Secondary | ICD-10-CM | POA: Diagnosis not present

## 2017-07-03 DIAGNOSIS — E039 Hypothyroidism, unspecified: Secondary | ICD-10-CM | POA: Diagnosis present

## 2017-07-03 DIAGNOSIS — Z8249 Family history of ischemic heart disease and other diseases of the circulatory system: Secondary | ICD-10-CM

## 2017-07-03 DIAGNOSIS — Z83518 Family history of other specified eye disorder: Secondary | ICD-10-CM

## 2017-07-03 DIAGNOSIS — R45851 Suicidal ideations: Secondary | ICD-10-CM | POA: Diagnosis present

## 2017-07-03 DIAGNOSIS — N3281 Overactive bladder: Secondary | ICD-10-CM | POA: Diagnosis present

## 2017-07-03 DIAGNOSIS — Z833 Family history of diabetes mellitus: Secondary | ICD-10-CM

## 2017-07-03 DIAGNOSIS — F209 Schizophrenia, unspecified: Secondary | ICD-10-CM | POA: Diagnosis not present

## 2017-07-03 DIAGNOSIS — Z818 Family history of other mental and behavioral disorders: Secondary | ICD-10-CM

## 2017-07-03 DIAGNOSIS — K269 Duodenal ulcer, unspecified as acute or chronic, without hemorrhage or perforation: Secondary | ICD-10-CM | POA: Diagnosis present

## 2017-07-03 DIAGNOSIS — N39498 Other specified urinary incontinence: Secondary | ICD-10-CM | POA: Diagnosis present

## 2017-07-03 DIAGNOSIS — F17200 Nicotine dependence, unspecified, uncomplicated: Secondary | ICD-10-CM | POA: Diagnosis present

## 2017-07-03 DIAGNOSIS — T43505D Adverse effect of unspecified antipsychotics and neuroleptics, subsequent encounter: Secondary | ICD-10-CM | POA: Diagnosis not present

## 2017-07-03 DIAGNOSIS — I10 Essential (primary) hypertension: Secondary | ICD-10-CM | POA: Diagnosis present

## 2017-07-03 DIAGNOSIS — Z9114 Patient's other noncompliance with medication regimen: Secondary | ICD-10-CM

## 2017-07-03 DIAGNOSIS — M75100 Unspecified rotator cuff tear or rupture of unspecified shoulder, not specified as traumatic: Secondary | ICD-10-CM | POA: Diagnosis present

## 2017-07-03 DIAGNOSIS — G255 Other chorea: Secondary | ICD-10-CM | POA: Diagnosis not present

## 2017-07-03 DIAGNOSIS — F1210 Cannabis abuse, uncomplicated: Secondary | ICD-10-CM | POA: Diagnosis present

## 2017-07-03 DIAGNOSIS — F250 Schizoaffective disorder, bipolar type: Secondary | ICD-10-CM | POA: Diagnosis not present

## 2017-07-03 DIAGNOSIS — F329 Major depressive disorder, single episode, unspecified: Secondary | ICD-10-CM | POA: Diagnosis not present

## 2017-07-03 DIAGNOSIS — R451 Restlessness and agitation: Secondary | ICD-10-CM | POA: Diagnosis present

## 2017-07-03 DIAGNOSIS — Z789 Other specified health status: Secondary | ICD-10-CM | POA: Diagnosis not present

## 2017-07-03 HISTORY — DX: Acute kidney failure, unspecified: N17.9

## 2017-07-03 HISTORY — DX: Hypokalemia: E87.6

## 2017-07-03 HISTORY — DX: Gastrointestinal hemorrhage, unspecified: K92.2

## 2017-07-03 HISTORY — DX: Patient's other noncompliance with medication regimen: Z91.14

## 2017-07-03 HISTORY — DX: Hypo-osmolality and hyponatremia: E87.1

## 2017-07-03 HISTORY — DX: Gastritis, unspecified, without bleeding: K29.70

## 2017-07-03 HISTORY — DX: Hypotension, unspecified: I95.9

## 2017-07-03 MED ORDER — HYDROXYZINE HCL 50 MG PO TABS: 50.0000 mg | ORAL_TABLET | Freq: Three times a day (TID) | ORAL | Status: DC | PRN

## 2017-07-03 MED ORDER — MAGNESIUM HYDROXIDE 400 MG/5ML PO SUSP: 30.0000 mL | Freq: Every day | ORAL | Status: DC | PRN

## 2017-07-03 MED ORDER — LISINOPRIL 20 MG PO TABS
30.0000 mg | ORAL_TABLET | Freq: Every day | ORAL | Status: DC
  Administered 2017-07-04 – 2017-07-10 (×7): 30 mg via ORAL
  Filled 2017-07-03 (×7): qty 2

## 2017-07-03 MED ORDER — VENLAFAXINE HCL 25 MG PO TABS
50.0000 mg | ORAL_TABLET | Freq: Two times a day (BID) | ORAL | Status: DC
  Administered 2017-07-03 – 2017-07-06 (×6): 50 mg via ORAL
  Filled 2017-07-03 (×2): qty 2
  Filled 2017-07-03: qty 1
  Filled 2017-07-03 (×2): qty 2
  Filled 2017-07-03 (×2): qty 1

## 2017-07-03 MED ORDER — ALBUTEROL SULFATE HFA 108 (90 BASE) MCG/ACT IN AERS
2.0000 | INHALATION_SPRAY | Freq: Four times a day (QID) | RESPIRATORY_TRACT | Status: DC | PRN
  Administered 2017-07-12: 2 via RESPIRATORY_TRACT
  Filled 2017-07-03: qty 6.7

## 2017-07-03 MED ORDER — TRAZODONE HCL 100 MG PO TABS
100.0000 mg | ORAL_TABLET | Freq: Every evening | ORAL | Status: DC | PRN
  Administered 2017-07-04 – 2017-07-05 (×2): 100 mg via ORAL
  Filled 2017-07-03 (×2): qty 1

## 2017-07-03 MED ORDER — QUETIAPINE FUMARATE 200 MG PO TABS
300.0000 mg | ORAL_TABLET | Freq: Every day | ORAL | Status: DC
  Administered 2017-07-03 – 2017-07-04 (×2): 300 mg via ORAL
  Filled 2017-07-03 (×2): qty 1

## 2017-07-03 MED ORDER — DOCUSATE SODIUM 100 MG PO CAPS
100.0000 mg | ORAL_CAPSULE | Freq: Two times a day (BID) | ORAL | Status: DC
  Administered 2017-07-03 – 2017-07-23 (×39): 100 mg via ORAL
  Filled 2017-07-03 (×39): qty 1

## 2017-07-03 MED ORDER — MELOXICAM 7.5 MG PO TABS
15.0000 mg | ORAL_TABLET | Freq: Every day | ORAL | Status: DC
  Administered 2017-07-03 – 2017-07-23 (×21): 15 mg via ORAL
  Filled 2017-07-03 (×21): qty 2

## 2017-07-03 MED ORDER — ALUM & MAG HYDROXIDE-SIMETH 200-200-20 MG/5ML PO SUSP: 30.0000 mL | ORAL | Status: DC | PRN

## 2017-07-03 MED ORDER — PANTOPRAZOLE SODIUM 40 MG PO TBEC
40.0000 mg | DELAYED_RELEASE_TABLET | Freq: Every day | ORAL | Status: DC
  Administered 2017-07-04 – 2017-07-23 (×20): 40 mg via ORAL
  Filled 2017-07-03 (×20): qty 1

## 2017-07-03 MED ORDER — LEVOTHYROXINE SODIUM 100 MCG PO TABS
100.0000 ug | ORAL_TABLET | Freq: Every day | ORAL | Status: DC
  Administered 2017-07-04: 100 ug via ORAL
  Filled 2017-07-03: qty 1

## 2017-07-03 MED ORDER — ACETAMINOPHEN 325 MG PO TABS
650.0000 mg | ORAL_TABLET | Freq: Four times a day (QID) | ORAL | Status: DC | PRN
  Administered 2017-07-12 – 2017-07-17 (×6): 650 mg via ORAL
  Filled 2017-07-03 (×6): qty 2

## 2017-07-03 MED ORDER — AMITRIPTYLINE HCL 50 MG PO TABS
50.0000 mg | ORAL_TABLET | Freq: Every day | ORAL | Status: DC
  Administered 2017-07-03 – 2017-07-04 (×2): 50 mg via ORAL
  Filled 2017-07-03 (×2): qty 1

## 2017-07-03 NOTE — ED Notes (Signed)
BEHAVIORAL HEALTH ROUNDING Patient sleeping: Yes.   Patient alert and oriented: not applicable SLEEPING Behavior appropriate: Yes.  ; If no, describe: SLEEPING Nutrition and fluids offered: No SLEEPING Toileting and hygiene offered: NoSLEEPING Sitter present: not applicable, Q 15 min safety rounds and observation. Law enforcement present: Yes ODS 

## 2017-07-03 NOTE — ED Notes (Signed)
Patient observed lying in bed with eyes closed  Even, unlabored respirations observed   NAD pt appears to be sleeping  I will continue to monitor along with every 15 minute visual observations and ongoing security monitoring    

## 2017-07-03 NOTE — ED Notes (Signed)
BEHAVIORAL HEALTH ROUNDING Patient sleeping: Yes.   Patient alert and oriented: eyes closed  Appears to be asleep Behavior appropriate: Yes.  ; If no, describe:  Nutrition and fluids offered: Yes  Toileting and hygiene offered: sleeping Sitter present: q 15 minute observations and security monitoring Law enforcement present: yes  ODS 

## 2017-07-03 NOTE — ED Notes (Signed)
BEHAVIORAL HEALTH ROUNDING  Patient sleeping: No.  Patient alert and oriented: yes  Behavior appropriate: Yes. ; If no, describe:  Nutrition and fluids offered: Yes  Toileting and hygiene offered: Yes  Sitter present: not applicable, Q 15 min safety rounds and observation.  Law enforcement present: Yes ODS  

## 2017-07-03 NOTE — ED Notes (Signed)

## 2017-07-03 NOTE — ED Notes (Signed)

## 2017-07-03 NOTE — ED Notes (Signed)
ED  Is the patient under IVC or is there intent for IVC: Yes.   Is the patient medically cleared: Yes.   Is there vacancy in the ED BHU: Yes.   Is the population mix appropriate for patient: Yes.   Is the patient awaiting placement in inpatient or outpatient setting: awaiting bed to be assigned   Has the patient had a psychiatric consult: Yes.   Survey of unit performed for contraband, proper placement and condition of furniture, tampering with fixtures in bathroom, shower, and each patient room: Yes.  ; Findings:  APPEARANCE/BEHAVIOR Calm and cooperative NEURO ASSESSMENT Orientation: oriented x3  Denies pain Hallucinations: No.None noted (Hallucinations) Speech: Normal to loud  Gait: normal RESPIRATORY ASSESSMENT Even  Unlabored respirations  CARDIOVASCULAR ASSESSMENT Pulses equal   regular rate  Skin warm and dry   GASTROINTESTINAL ASSESSMENT no GI complaint EXTREMITIES Full ROM  PLAN OF CARE Provide calm/safe environment. Vital signs assessed twice daily. ED BHU Assessment once each 12-hour shift. Collaborate with TTS daily or as condition indicates. Assure the ED provider has rounded once each shift. Provide and encourage hygiene. Provide redirection as needed. Assess for escalating behavior; address immediately and inform ED provider.  Assess family dynamic and appropriateness for visitation as needed: Yes.  ; If necessary, describe findings:  Educate the patient/family about BHU procedures/visitation: Yes.  ; If necessary, describe findings:

## 2017-07-03 NOTE — Tx Team (Signed)
Initial Treatment Plan 07/03/2017 4:44 PM [name] YPP:[id number]    PATIENT STRESSORS: Educational concerns Financial difficulties Health problems Legal issue Medication change or noncompliance Substance abuse   PATIENT STRENGTHS: Ability for insight Capable of independent living Communication skills Religious Affiliation Supportive family/friends   PATIENT IDENTIFIED PROBLEMS: Depression  07/03/17  Suicidal  07/03/17  Altered Thoughts 07/03/17                 DISCHARGE CRITERIA:  Ability to meet basic life and health needs Improved stabilization in mood, thinking, and/or behavior Motivation to continue treatment in a less acute level of care  PRELIMINARY DISCHARGE PLAN: Attend aftercare/continuing care group Outpatient therapy Return to previous living arrangement  PATIENT/FAMILY INVOLVEMENT: This treatment plan has been presented to and reviewed with the patient, Christopher Mason, and/or family member,  .  The patient and family have been given the opportunity to ask questions and make suggestions.  Leodis Liverpool, RN 07/03/2017, 4:44 PM

## 2017-07-03 NOTE — ED Notes (Signed)
BEHAVIORAL HEALTH ROUNDING Patient sleeping: No. Patient alert and oriented: yes Behavior appropriate: Yes.  ; If no, describe:  Nutrition and fluids offered: yes Toileting and hygiene offered: Yes  Sitter present: q15 minute observations and security  monitoring Law enforcement present: Yes  ODS  
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
## 2017-07-03 NOTE — ED Notes (Signed)
Pt IVC/Seen by Dr Clapacs-Recommends placement.

## 2017-07-03 NOTE — BH Assessment (Signed)
Patient is to be admitted to [facility] by Dr. Weber Cooks.  Attending Physician will be Dr. Wonda Olds.   Patient has been assigned to room 313, by [place].   Intake Paper Work has been signed and placed on patient chart.  ER staff is aware of the admission:  [place], ER Sectary   Dr. Corky Downs, ER MD   Addison Lank, Patient's Nurse   [place], Patient Access.

## 2017-07-03 NOTE — Progress Notes (Signed)
Admission Note:  D: Pt appeared depressed  With  a flat affect.  Pt  denies SI / AVH at this time.  Patient loud  Venting at staff about The  ACT Team would not help him move out of his apartment . Patient easily agitated . Patient cursed Engineer, mining  Several times during interview . currently patient is homeless Patient has a hx of IBS COPD  Gastric Reflux and HTN. Patient has difficulty  with placement  Pt is redirectable and cooperative with assessment.      A: Pt admitted to unit per protocol, skin assessment and search done and no contraband found.  Pt  educated on therapeutic milieu rules. Pt was introduced to milieu by nursing staff.    R: Pt was receptive to education about the milieu .  15 min safety checks started. Probation officer offered support

## 2017-07-03 NOTE — Progress Notes (Signed)
Patient ID: Christopher Mason, male   DOB: [birth year], 57 y.o.   MRN: [id number] Unfriendly, hostile, yelling, loud speech, cursing, thoughts fragmented, disorganized, tangential with loose association, blaming his ACT Team, "they let me down, I lost everything, I can't trust anyone again...." Reassured that the SW will look into his case; encouraged to take medications as ordered and he did.

## 2017-07-03 NOTE — Plan of Care (Signed)
New  admission   Not Progressing Coping: Ability to cope will improve 07/03/2017 1640 - Not Progressing by Leodis Liverpool, RN Ability to verbalize feelings will improve 07/03/2017 1640 - Not Progressing by Leodis Liverpool, RN Education: Knowledge of [place] Education information/materials will improve 07/03/2017 1640 - Not Progressing by Leodis Liverpool, RN Emotional status will improve 07/03/2017 1640 - Not Progressing by Leodis Liverpool, RN Safety: Ability to demonstrate self-control will improve 07/03/2017 1640 - Not Progressing by Leodis Liverpool, RN Ability to redirect hostility and anger into socially appropriate behaviors will improve 07/03/2017 1640 - Not Progressing by Leodis Liverpool, RN

## 2017-07-04 ENCOUNTER — Encounter: Payer: Self-pay | Admitting: Psychiatry

## 2017-07-04 DIAGNOSIS — F209 Schizophrenia, unspecified: Secondary | ICD-10-CM

## 2017-07-04 LAB — URINALYSIS, COMPLETE (UACMP) WITH MICROSCOPIC
BILIRUBIN URINE: NEGATIVE
Bacteria, UA: NONE SEEN
Glucose, UA: NEGATIVE mg/dL
HGB URINE DIPSTICK: NEGATIVE
Ketones, ur: NEGATIVE mg/dL
LEUKOCYTES UA: NEGATIVE
NITRITE: NEGATIVE
PH: 6 (ref 5.0–8.0)
Protein, ur: NEGATIVE mg/dL
SPECIFIC GRAVITY, URINE: 1.015 (ref 1.005–1.030)
Squamous Epithelial / LPF: NONE SEEN

## 2017-07-04 LAB — HEMOGLOBIN A1C
HEMOGLOBIN A1C: 5.4 % (ref 4.8–5.6)
MEAN PLASMA GLUCOSE: 108.28 mg/dL

## 2017-07-04 LAB — LIPID PANEL
Cholesterol: 200 mg/dL (ref 0–200)
HDL: 53 mg/dL (ref >40)
LDL CALC: 94 mg/dL (ref 0–99)
Total CHOL/HDL Ratio: 3.8 RATIO
Triglycerides: 266 mg/dL — ABNORMAL HIGH (ref <150)
VLDL: 53 mg/dL — ABNORMAL HIGH (ref 0–40)

## 2017-07-04 LAB — TSH: TSH: 104.126 u[IU]/mL — AB (ref 0.350–4.500)

## 2017-07-04 LAB — T4, FREE: FREE T4: 0.46 ng/dL — AB (ref 0.61–1.12)

## 2017-07-04 MED ORDER — QUETIAPINE FUMARATE 25 MG PO TABS
50.0000 mg | ORAL_TABLET | Freq: Four times a day (QID) | ORAL | Status: DC | PRN
  Administered 2017-07-10: 50 mg via ORAL
  Filled 2017-07-04: qty 2

## 2017-07-04 MED ORDER — LORAZEPAM 1 MG PO TABS: 1.0000 mg | ORAL_TABLET | Freq: Four times a day (QID) | ORAL | Status: DC | PRN

## 2017-07-04 MED ORDER — QUETIAPINE FUMARATE 25 MG PO TABS
50.0000 mg | ORAL_TABLET | ORAL | Status: DC
  Administered 2017-07-05: 50 mg via ORAL
  Filled 2017-07-04: qty 2

## 2017-07-04 NOTE — Tx Team (Signed)
Interdisciplinary Treatment and Diagnostic Plan Update  07/04/2017 Time of Session:11AM Christopher Mason MRN: [id number]  Principal Diagnosis: <principal problem not specified>  Secondary Diagnoses: Active Problems:   Schizophrenia ([place])   Current Medications:  Current Facility-Administered Medications  Medication Dose Route Frequency Provider Last Rate Last Dose  . acetaminophen (TYLENOL) tablet 650 mg  650 mg Oral Q6H PRN Clapacs, John T, MD      . albuterol (PROVENTIL HFA;VENTOLIN HFA) 108 (90 Base) MCG/ACT inhaler 2 puff  2 puff Inhalation Q6H PRN Clapacs, John T, MD      . alum & mag hydroxide-simeth (MAALOX/MYLANTA) 200-200-20 MG/5ML suspension 30 mL  30 mL Oral Q4H PRN Clapacs, John T, MD      . amitriptyline (ELAVIL) tablet 50 mg  50 mg Oral QHS Clapacs, Madie Reno, MD   50 mg at 07/03/17 2146  . docusate sodium (COLACE) capsule 100 mg  100 mg Oral BID Clapacs, Madie Reno, MD   100 mg at 07/04/17 0758  . hydrOXYzine (ATARAX/VISTARIL) tablet 50 mg  50 mg Oral TID PRN Clapacs, John T, MD      . levothyroxine (SYNTHROID, LEVOTHROID) tablet 100 mcg  100 mcg Oral Q0600 Clapacs, Madie Reno, MD   100 mcg at 07/04/17 [phone]  . lisinopril (PRINIVIL,ZESTRIL) tablet 30 mg  30 mg Oral Daily Clapacs, Madie Reno, MD   30 mg at 07/04/17 0756  . magnesium hydroxide (MILK OF MAGNESIA) suspension 30 mL  30 mL Oral Daily PRN Clapacs, John T, MD      . meloxicam (MOBIC) tablet 15 mg  15 mg Oral Daily Clapacs, Madie Reno, MD   15 mg at 07/04/17 0758  . pantoprazole (PROTONIX) EC tablet 40 mg  40 mg Oral Daily Clapacs, Madie Reno, MD   40 mg at 07/04/17 0758  . QUEtiapine (SEROQUEL) tablet 300 mg  300 mg Oral QHS Clapacs, John T, MD   300 mg at 07/03/17 2146  . traZODone (DESYREL) tablet 100 mg  100 mg Oral QHS PRN Clapacs, John T, MD      . venlafaxine [facility]) tablet 50 mg  50 mg Oral BID WC Clapacs, Madie Reno, MD   50 mg at 07/04/17 2993   PTA Medications: Medications Prior to Admission  Medication Sig Dispense Refill Last Dose   . albuterol (PROVENTIL HFA;VENTOLIN HFA) 108 (90 BASE) MCG/ACT inhaler Inhale 2 puffs into the lungs every 4 (four) hours as needed for wheezing or shortness of breath. 1 each 0 Taking  . amitriptyline (ELAVIL) 50 MG tablet Take 100 mg by mouth at bedtime.   Taking  . amLODipine (NORVASC) 10 MG tablet Take 1 tablet (10 mg total) by mouth daily. 30 tablet 2 Taking  . amoxicillin-clavulanate (AUGMENTIN) 875-125 MG tablet Take 1 tablet by mouth 2 (two) times daily. (Patient not taking: Reported on 07/03/2017) 20 tablet 0 Not Taking at --  . benzonatate (TESSALON PERLES) 100 MG capsule Take 1 capsule (100 mg total) by mouth every 6 (six) hours as needed for cough. (Patient not taking: Reported on 05/16/2017) 15 capsule 0 Not Taking at --  . diclofenac sodium (VOLTAREN) 1 % GEL Apply 2 g topically 3 (three) times daily. (Patient not taking: Reported on 05/16/2017) 100 g 0 Not Taking  . docusate sodium (COLACE) 100 MG capsule Take 1 capsule (100 mg total) by mouth daily. 30 capsule 3   . doxycycline (VIBRAMYCIN) 100 MG capsule Take 1 capsule (100 mg total) by mouth 2 (two) times daily. (Patient not taking: Reported  on 05/16/2017) 28 capsule 0 Not Taking at --  . ibuprofen (ADVIL,MOTRIN) 800 MG tablet Take 1 tablet (800 mg total) by mouth every 8 (eight) hours as needed. (Patient not taking: Reported on 05/16/2017) 15 tablet 0 Not Taking at --  . loperamide (IMODIUM A-D) 2 MG tablet Take 1 tablet (2 mg total) by mouth 4 (four) times daily as needed for diarrhea or loose stools. (Patient not taking: Reported on 06/07/2017) 12 tablet 0 Not Taking at --  . meloxicam (MOBIC) 15 MG tablet Take 1 tablet (15 mg total) by mouth daily. 30 tablet 0 Taking  . methocarbamol (ROBAXIN) 500 MG tablet Take 1 tablet (500 mg total) by mouth 4 (four) times daily. 16 tablet 0 Taking  . omeprazole (PRILOSEC) 20 MG capsule Take 1 capsule (20 mg total) by mouth 2 (two) times daily before a meal. 60 capsule 0 Taking  . ondansetron  (ZOFRAN) 4 MG tablet Take 1 tablet (4 mg total) by mouth daily as needed for nausea or vomiting. 20 tablet 1 PRN at PRN  . pravastatin (PRAVACHOL) 10 MG tablet Take 1 tablet (10 mg total) by mouth daily. 30 tablet 2 Taking  . QUEtiapine (SEROQUEL) 25 MG tablet Take 25 mg by mouth 3 (three) times daily.   Taking  . QUEtiapine (SEROQUEL) 300 MG tablet Take 1 tablet (300 mg total) by mouth at bedtime. 30 tablet 0 Taking  . solifenacin (VESICARE) 10 MG tablet Take 1 tab by mouth nightly for overactive bladder 30 tablet 2 Taking  . sucralfate (CARAFATE) 1 g tablet Take 1 tablet (1 g total) by mouth 3 (three) times daily. 30 tablet 5 Taking  . tamsulosin (FLOMAX) 0.4 MG CAPS capsule Take by mouth.   Taking  . thyroid (ARMOUR) 60 MG tablet Take 120 mg by mouth daily before breakfast.   Taking  . venlafaxine (EFFEXOR) 50 MG tablet Take 50 mg by mouth 2 (two) times daily.   Taking    Patient Stressors: Educational concerns Financial difficulties Health problems Legal issue Medication change or noncompliance Substance abuse  Patient Strengths: Ability for insight Capable of independent living Communication skills Religious Affiliation Supportive family/friends  Treatment Modalities: Medication Management, Group therapy, Case management,  1 to 1 session with clinician, Psychoeducation, Recreational therapy.   Physician Treatment Plan for Primary Diagnosis: <principal problem not specified> Long Term Goal(s):     Short Term Goals:    Medication Management: Evaluate patient's response, side effects, and tolerance of medication regimen.  Therapeutic Interventions: 1 to 1 sessions, Unit Group sessions and Medication administration.  Evaluation of Outcomes: Progressing  Physician Treatment Plan for Secondary Diagnosis: Active Problems:   Schizophrenia ([place])  Long Term Goal(s):     Short Term Goals:       Medication Management: Evaluate patient's response, side effects, and tolerance of  medication regimen.  Therapeutic Interventions: 1 to 1 sessions, Unit Group sessions and Medication administration.  Evaluation of Outcomes: Progressing   RN Treatment Plan for Primary Diagnosis: <principal problem not specified> Long Term Goal(s): Knowledge of disease and therapeutic regimen to maintain health will improve  Short Term Goals: Ability to verbalize frustration and anger appropriately will improve, Ability to demonstrate self-control and Ability to identify and develop effective coping behaviors will improve  Medication Management: RN will administer medications as ordered by provider, will assess and evaluate patient's response and provide education to patient for prescribed medication. RN will report any adverse and/or side effects to prescribing provider.  Therapeutic Interventions: 1 on  1 counseling sessions, Psychoeducation, Medication administration, Evaluate responses to treatment, Monitor vital signs and CBGs as ordered, Perform/monitor CIWA, COWS, AIMS and Fall Risk screenings as ordered, Perform wound care treatments as ordered.  Evaluation of Outcomes: Progressing   LCSW Treatment Plan for Primary Diagnosis: <principal problem not specified> Long Term Goal(s): Safe transition to appropriate next level of care at discharge, Engage patient in therapeutic group addressing interpersonal concerns.  Short Term Goals: Engage patient in aftercare planning with referrals and resources, Increase social support, Increase ability to appropriately verbalize feelings, Increase emotional regulation, Identify triggers associated with mental health/substance abuse issues and Increase skills for wellness and recovery  Therapeutic Interventions: Assess for all discharge needs, 1 to 1 time with Social worker, Explore available resources and support systems, Assess for adequacy in community support network, Educate family and significant other(s) on suicide prevention, Complete  Psychosocial Assessment, Interpersonal group therapy.  Evaluation of Outcomes: Progressing   Progress in Treatment: Attending groups: No. Participating in groups: No. Taking medication as prescribed: Yes. Toleration medication: Yes. Family/Significant other contact made: No, will contact:  [name], Patients Girlfriend Patient understands diagnosis: Yes. Discussing patient identified problems/goals with staff: Yes. Medical problems stabilized or resolved: Yes. Denies suicidal/homicidal ideation: Yes. Issues/concerns per patient self-inventory: No. Other:   New problem(s) identified: No, Describe:  None  New Short Term/Long Term Goal(s): "To get me a place to live."  Discharge Plan or Barriers: To go to a shelter if placement is not secured and continue to work with ACTT team.   Reason for Continuation of Hospitalization: Aggression Anxiety Delusions  Depression Medication stabilization  Estimated Length of Stay: 7days  Attendees: Patient: Christopher Mason 07/04/2017 11:11 AM  Physician: Amador Cunas, MD 07/04/2017 11:11 AM  Nursing: Elige Radon, RN 07/04/2017 11:11 AM  RN Care Manager: 07/04/2017 11:11 AM  Social Worker: Darin Engels, [place] 07/04/2017 11:11 AM  Recreational Therapist:  07/04/2017 11:11 AM  Other: Dossie Arbour, LCSW 07/04/2017 11:11 AM  Other: Alden Hipp, LCSW 07/04/2017 11:11 AM  Other: 07/04/2017 11:11 AM    Scribe for Treatment Team: Darin Engels, LCSW 07/04/2017 11:11 AM

## 2017-07-04 NOTE — BHH Suicide Risk Assessment (Signed)
[facility] Admission Suicide Risk Assessment   Nursing information obtained from:    Demographic factors:    57 yo male, homeless Current Mental Status:   agitated Loss Factors:   loss of housing, lack of support Historical Factors:   long history of schizophrenia Risk Reduction Factors:   ACT team referral, his dog  Total Time spent with patient: 45 minutes Principal Problem: Schizophrenia ([place]) Diagnosis:   Patient Active Problem List   Diagnosis Date Noted  . Schizophrenia ([place]) [F20.9] 07/03/2017    Priority: High  . Tardive dyskinesia [G24.01] 09/16/2014    Priority: Low  . HLD (hyperlipidemia) [E78.5] 02/07/2015  . COPD (chronic obstructive pulmonary disease) ([place]) [J44.9] 02/07/2015  . Cannabis use disorder, severe, dependence ([place]) [F12.20] 09/24/2014  . Hypothyroidism [E03.9] 09/17/2014  . GERD (gastroesophageal reflux disease) [K21.9] 09/17/2014  . Tobacco use disorder [F17.200] 09/17/2014  . Asthma [J45.909] 09/16/2014  . HTN (hypertension) [I10] 09/16/2014   Subjective Data: See H&P  Continued Clinical Symptoms:  Alcohol Use Disorder Identification Test Final Score (AUDIT): 0 The "Alcohol Use Disorders Identification Test", Guidelines for Use in Primary Care, Second Edition.  World Pharmacologist [facility]). Score between 0-7:  no or low risk or alcohol related problems. Score between 8-15:  moderate risk of alcohol related problems. Score between 16-19:  high risk of alcohol related problems. Score 20 or above:  warrants further diagnostic evaluation for alcohol dependence and treatment.   CLINICAL FACTORS:   Schizophrenia:   Paranoid or undifferentiated type      COGNITIVE FEATURES THAT CONTRIBUTE TO RISK:  Polarized thinking    SUICIDE RISK:   Minimal: No identifiable suicidal ideation.    PLAN OF CARE: See H&P  I certify that inpatient services furnished can reasonably be expected to improve the patient's condition.   Marylin Crosby, MD 07/04/2017,  11:25 AM

## 2017-07-04 NOTE — BHH Group Notes (Signed)
[place] Group Notes:  (Nursing/MHT/Case Management/Adjunct)  Date:  07/04/2017  Time:  3:29 PM  Type of Therapy:  Psychoeducational Skills  Participation Level:  Did Not Attend  Adela Lank [facility] 07/04/2017, 3:29 PM

## 2017-07-04 NOTE — BHH Suicide Risk Assessment (Signed)
[place] [place] INPATIENT:  Family/Significant Other Suicide Prevention Education  Suicide Prevention Education:  Education Completed; [place] Team Lead, [phone] has been identified by the patient as the family member/significant other with whom the patient will be residing, and identified as the person(s) who will aid the patient in the event of a mental health crisis (suicidal ideations/suicide attempt).  With written consent from the patient, the family member/significant other has been provided the following suicide prevention education, prior to the and/or following the discharge of the patient.  The suicide prevention education provided includes the following:  Suicide risk factors  Suicide prevention and interventions  National Suicide Hotline telephone number  [facility] assessment telephone number  [facility] Emergency Assistance [place] and/or Residential Mobile Crisis Unit telephone number  Request made of family/significant other to:  Remove weapons (e.g., guns, rifles, knives), all items previously/currently identified as safety concern.    Remove drugs/medications (over-the-counter, prescriptions, illicit drugs), all items previously/currently identified as a safety concern.  The family member/significant other verbalizes understanding of the suicide prevention education information provided.  The family member/significant other agrees to remove the items of safety concern listed above.  CSW spoke with [facility] Team Lead Elmo Putt. She reports that the patient has a lot of resistance since they didn't help with his housing. She says that he was evicted because he failed his house inspections, continuously smoked Marijuana in the apartment and allowed dog to do what he wanted to do. She identifies the patient as being very labile and mentions that he was expressing SI. She says that he has been upset before but he has never gone this far. No access to  guns/weapons reported.    Darin Engels 07/04/2017, 1:34 PM

## 2017-07-04 NOTE — BHH Counselor (Addendum)
CSW spoke with [facility] Team Lead Elmo Putt. She reports that the patient has a lot of resistance since they didn't help with his housing. She says that he was evicted because he failed his house inspections, continuously smoked Marijuana in the apartment and allowed dog to do what he wanted to do. She identifies the patient as being very labile and mentions that he was expressing SI. She says that he has been upset before but he has never gone this far. Elmo Putt has been working with him since 2016. She says that the ACTT team signed him up for the [place] program through [facility] but he doesn't have a Glass blower/designer yet. She says that she attempted to get him to go to [place] [facility] but they feel like they cannot handle his mental health symptoms. His next resort will be to either go to a group home, the shelter in [place] or to live in a tent which he wants to do. She says that he has over $1,000.00 bill due to them. She reports that the patient has Medicaid and disability. She reports that the patient has linked himself to [facility] already before this happened. They will still work with him at discharge if he is willing. Otherwise, CSW will refer the patient to [facility] ACTT team.   Darin Engels, MSW, LCSWA, LCASA 07/04/2017 1:34 PM

## 2017-07-04 NOTE — Plan of Care (Signed)
Patient is safe in the unit, contract for safety of self and others,  patient denies dilutions but endorses visual hallucinations, states that he see the walls moving and I get up and move away from there. Support and encouragement is accorded to patient to be assertive with peers and participate in group activities. Patient is cooperating with his medical regimen, denies any suicidal thoughts or H/I no acute distress noted. Progressing Coping: Ability to cope will improve 07/04/2017 1920 - Progressing by Clemens Catholic, RN Education: Knowledge of [place] Education information/materials will improve 07/04/2017 1920 - Progressing by Clemens Catholic, RN Emotional status will improve 07/04/2017 1920 - Progressing by Clemens Catholic, RN Safety: Ability to demonstrate self-control will improve 07/04/2017 1920 - Progressing by Clemens Catholic, RN Ability to redirect hostility and anger into socially appropriate behaviors will improve 07/04/2017 1920 - Progressing by Clemens Catholic, RN Activity: Sleeping patterns will improve 07/04/2017 1920 - Progressing by Clemens Catholic, RN
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
## 2017-07-04 NOTE — Plan of Care (Signed)
Patient slept for Estimated Hours of 6; Precautionary checks every 15 minutes for safety maintained, room free of safety hazards, patient sustains no injury or falls during this shift.

## 2017-07-04 NOTE — Progress Notes (Signed)
D: Affect pleasant on approach , but patient leads into  Talking about  What  The ACT team  Did to him  Noted to get loud at time during the conversation. Eyes closed  With his head turned in gestures  And using his hands  To explaine what he  Went through. Voice of his dog  Tiny Max  Whom he bought 3 months ago . Patient  Verbalize a closeness to this dog . Patient refused  Lab draw this am shift  But later came back to say he would do it. Patient apologized for all the profanity he displayed  This am .  Appetite  Good . No ADL'S this shift . Continue to wear  Scrubs   On the unit .  Patient working on Scientific laboratory technician , noted today verbalizing  feeling more . Emotional and mental status improved from yesterday . Able to come back to staff and apologize  for bad behavior . No anger outburst today . Voice no concerns around angerNo pain concerns . Limited Interacting with peers and staff.   A: Encourage patient participation with unit programming . Instruction  Given on  Medication , verbalize understanding.  R: Voice no other concerns. Staff continue to monitor

## 2017-07-04 NOTE — H&P (Addendum)
Psychiatric Admission Assessment Adult  Patient Identification: Christopher Mason MRN:  [id number] Date of Evaluation:  07/04/2017 Chief Complaint:  "I Hate the ACT Team" Principal Diagnosis: Schizophrenia ([place]) Diagnosis:   Patient Active Problem List   Diagnosis Date Noted  . Schizophrenia ([place]) [F20.9] 07/03/2017    Priority: High  . Tardive dyskinesia [G24.01] 09/16/2014    Priority: Low  . HLD (hyperlipidemia) [E78.5] 02/07/2015  . COPD (chronic obstructive pulmonary disease) ([place]) [J44.9] 02/07/2015  . Cannabis use disorder, severe, dependence ([place]) [F12.20] 09/24/2014  . Hypothyroidism [E03.9] 09/17/2014  . GERD (gastroesophageal reflux disease) [K21.9] 09/17/2014  . Tobacco use disorder [F17.200] 09/17/2014  . Asthma [J45.909] 09/16/2014  . HTN (hypertension) [I10] 09/16/2014   History of Present Illness: 57 yo male admitted due to agitation. Pt is very angry and upset and is very perseverative on the ACT team and feels like they have wronged him. He is yelling and agitated during interview and extremely perseverative on the ACT team but is able to redirect when asked other questions. He is not overall disorganized in thoughts but visibly upset. HE states that he was living in an apartment for 2 years but was kicked out for smoking cigarettes and marijuana. He was staying with his girlfriend, [name], who lives in the same complex however, the ACT team found out and he was not allowed to stay there. He has been staying in hotels but ran out of money and is now homeless. He does not get another disability check until 4/1. HE states that he wants his own apartment. HE states that he hates the [facility] team and "they don't care about me at all. They caused me to lose everything." He is very tearful and anxious and very difficult to get him off this topic. He states that he has a Engineer, mining, [employer], who is loves very much and is worried about him. He has a friend who is taking care of him right  now. He does talk about his dog when asked questions and able to not bring up ACT team during this. HE states that he does not have any family and "they all hate me." HE states that his girlfriend is his only support. He is adamant that he does not want to work with [facility] team anymore and is very blame placing on him. He yells loudly when talking about them but then apologizes stating, "I'm so sorry but I feel very agitated and I hate them. They hurt my feelings." He states that he wants to work with [employer] ACT team because "I've heard good things about them." He states that he has been compliant with medications but states, "I hate Seroquel. They try to sedate me. I hate that doctor there." He is unwilling to discuss alternative medication options. He adamantly refuses to stay in a shelter and states that he will stay in the woods instead. He also adamantly refuses a boarding house and only wants his own apartment. However, he will not have funding until April 1st. When asked, he denies SI, HI, AH, VH. Interview was concluded as he getting very upset about ACT Team.   Associated Signs/Symptoms: Depression Symptoms:  depressed mood, (Hypo) Manic Symptoms:  Denies Anxiety Symptoms:  Excessive Worry, Psychotic Symptoms:  Paranoia, PTSD Symptoms: Negative Total Time spent with patient: 45 minutes  Past Psychiatric History: History of schizophrenia and has had many hospitalizations. He has not had a hospitalization for a few years. He was admitted to [facility] in 2016.  He has been stable on Seroquel for many years. He follows currently with [facility] team. He has stayed in group homes in the past. Per chart review, one suicide attempts in the distant past.   Is the patient at risk to self? No.  Has the patient been a risk to self in the past 6 months? No.  Has the patient been a risk to self within the distant past? No.  Is the patient a risk to others? No.  Has the patient been a risk to others in the  past 6 months? No  Has the patient been a risk to others within the distant past? Yes.     Alcohol Screening: 1. How often do you have a drink containing alcohol?: Never 2. How many drinks containing alcohol do you have on a typical day when you are drinking?: 1 or 2 3. How often do you have six or more drinks on one occasion?: Never AUDIT-C Score: 0 4. How often during the last year have you found that you were not able to stop drinking once you had started?: Never 5. How often during the last year have you failed to do what was normally expected from you becasue of drinking?: Never 6. How often during the last year have you needed a first drink in the morning to get yourself going after a heavy drinking session?: Never 7. How often during the last year have you had a feeling of guilt of remorse after drinking?: Never 8. How often during the last year have you been unable to remember what happened the night before because you had been drinking?: Never 9. Have you or someone else been injured as a result of your drinking?: No 10. Has a relative or friend or a doctor or another health worker been concerned about your drinking or suggested you cut down?: No Alcohol Use Disorder Identification Test Final Score (AUDIT): 0 Intervention/Follow-up: AUDIT Score <7 follow-up not indicated Substance Abuse History in the last 12 months:  Yes.  , Marijuana. Denies alcohol or other drug use. Consequences of Substance Abuse: Unable to return to apartment Previous Psychotropic Medications: Yes  Psychological Evaluations: Yes  Past Medical History:  Past Medical History:  Diagnosis Date  . Asthma   . Bipolar disorder ([place])   . COPD (chronic obstructive pulmonary disease) ([place])   . Depression   . GERD (gastroesophageal reflux disease)   . HLD (hyperlipidemia)   . Hypertension   . Hypothyroid   . Irritable bowel syndrome (IBS)   . Schizophrenia [facility])     Past Surgical History:  Procedure Laterality  Date  . BACK SURGERY    . CARPAL TUNNEL RELEASE Bilateral   . ESOPHAGOGASTRODUODENOSCOPY (EGD) WITH PROPOFOL N/A 09/06/2015   Procedure: ESOPHAGOGASTRODUODENOSCOPY (EGD) WITH PROPOFOL;  Surgeon: Hulen Luster, MD;  Location[facility] ENDOSCOPY;  Service: Endoscopy;  Laterality: N/A;  . ROTATOR CUFF REPAIR     2007   Family History:  Family History  Problem Relation Age of Onset  . Hypertension Father   . Cataracts Father   . Diabetes Sister   . Diabetes Brother   . Dementia Mother    Family Psychiatric  History: Unknown Tobacco Screening: Have you used any form of tobacco in the last 30 days? (Cigarettes, Smokeless Tobacco, Cigars, and/or Pipes): Yes Tobacco use, Select all that apply: 5 or more cigarettes per day Are you interested in Tobacco Cessation Medications?: Yes, will notify MD for an order Counseled patient on smoking cessation  including recognizing danger situations, developing coping skills and basic information about quitting provided: Refused/Declined practical counseling Social History: Pt is originally from [place] and raised there. HE has been in [place] since 1986. He has lived in group homes and most recently had his own apartment. HE has been staying in hotels and is now homeless. He is on disability and gets $771 a month. No legal issues. He states that he has no contact with family.   Additional Social History: Are you sexually active?: No What is your sexual orientation?: Heterosexual                         Allergies:   Allergies  Allergen Reactions  . Aspirin Nausea And Vomiting and Swelling   Lab Results:  Results for orders placed or performed during the hospital encounter of 07/02/17 (from the past 48 hour(s))  Comprehensive metabolic panel     Status: Abnormal   Collection Time: 07/02/17  4:56 PM  Result Value Ref Range   Sodium 139 135 - 145 mmol/L   Potassium 3.7 3.5 - 5.1 mmol/L   Chloride 106 101 - 111 mmol/L   CO2 23 22 - 32  mmol/L   Glucose, Bld 104 (H) 65 - 99 mg/dL   BUN 14 6 - 20 mg/dL   Creatinine, Ser 0.85 0.61 - 1.24 mg/dL   Calcium 9.3 8.9 - 10.3 mg/dL   Total Protein 7.7 6.5 - 8.1 g/dL   Albumin 4.1 3.5 - 5.0 g/dL   AST 29 15 - 41 U/L   ALT 23 17 - 63 U/L   Alkaline Phosphatase 93 38 - 126 U/L   Total Bilirubin 0.6 0.3 - 1.2 mg/dL   GFR calc non Af Amer >60 >60 mL/min   GFR calc Af Amer >60 >60 mL/min    Comment: (NOTE) The eGFR has been calculated using the CKD EPI equation. This calculation has not been validated in all clinical situations. eGFR's persistently <60 mL/min signify possible Chronic Kidney Disease.    Anion gap 10 5 - 15    Comment: Performed at [facility], [address] [place], [place] [address]  Ethanol     Status: None   Collection Time: 07/02/17  4:56 PM  Result Value Ref Range   Alcohol, Ethyl (B) <10 <10 mg/dL    Comment:        LOWEST DETECTABLE LIMIT FOR SERUM ALCOHOL IS 10 mg/dL FOR MEDICAL PURPOSES ONLY Performed at [facility], [address] [place], [place] [address]   Salicylate level     Status: None   Collection Time: 07/02/17  4:56 PM  Result Value Ref Range   Salicylate Lvl <4.8 2.8 - 30.0 mg/dL    Comment: Performed at [facility], [address] [place], [place] [address]  Acetaminophen level     Status: Abnormal   Collection Time: 07/02/17  4:56 PM  Result Value Ref Range   Acetaminophen (Tylenol), Serum <10 (L) 10 - 30 ug/mL    Comment:        THERAPEUTIC CONCENTRATIONS VARY SIGNIFICANTLY. A RANGE OF 10-30 ug/mL MAY BE AN EFFECTIVE CONCENTRATION FOR MANY PATIENTS. HOWEVER, SOME ARE BEST TREATED AT CONCENTRATIONS OUTSIDE THIS RANGE. ACETAMINOPHEN CONCENTRATIONS >150 ug/mL AT 4 HOURS AFTER INGESTION AND >50 ug/mL AT 12 HOURS AFTER INGESTION ARE OFTEN ASSOCIATED WITH TOXIC REACTIONS. Performed at [facility], [address] [place], [place] [address]   cbc  Status: Abnormal    Collection Time: 07/02/17  4:56 PM  Result Value Ref Range   WBC 8.3 3.8 - 10.6 K/uL   RBC 4.47 4.40 - 5.90 MIL/uL   Hemoglobin 13.3 13.0 - 18.0 g/dL   HCT 39.6 (L) 40.0 - 52.0 %   MCV 88.6 80.0 - 100.0 fL   MCH 29.8 26.0 - 34.0 pg   MCHC 33.6 32.0 - 36.0 g/dL   RDW 16.0 (H) 11.5 - 14.5 %   Platelets 216 150 - 440 K/uL    Comment: Performed at [facility], [address] [place], [place] [address]  Urine Drug Screen, Qualitative     Status: Abnormal   Collection Time: 07/02/17  4:56 PM  Result Value Ref Range   Tricyclic, Ur Screen POSITIVE (A) NONE DETECTED   Amphetamines, Ur Screen NONE DETECTED NONE DETECTED   MDMA (Ecstasy)Ur Screen NONE DETECTED NONE DETECTED   Cocaine Metabolite,Ur [place] NONE DETECTED NONE DETECTED   Opiate, Ur Screen NONE DETECTED NONE DETECTED   Phencyclidine (PCP) Ur S NONE DETECTED NONE DETECTED   Cannabinoid 50 Ng, Ur [place] POSITIVE (A) NONE DETECTED   Barbiturates, Ur Screen NONE DETECTED NONE DETECTED   Benzodiazepine, Ur Scrn NONE DETECTED NONE DETECTED   Methadone Scn, Ur NONE DETECTED NONE DETECTED    Comment: (NOTE) Tricyclics + metabolites, urine    Cutoff 1000 ng/mL Amphetamines + metabolites, urine  Cutoff 1000 ng/mL MDMA (Ecstasy), urine              Cutoff 500 ng/mL Cocaine Metabolite, urine          Cutoff 300 ng/mL Opiate + metabolites, urine        Cutoff 300 ng/mL Phencyclidine (PCP), urine         Cutoff 25 ng/mL Cannabinoid, urine                 Cutoff 50 ng/mL Barbiturates + metabolites, urine  Cutoff 200 ng/mL Benzodiazepine, urine              Cutoff 200 ng/mL Methadone, urine                   Cutoff 300 ng/mL The urine drug screen provides only a preliminary, unconfirmed analytical test result and should not be used for non-medical purposes. Clinical consideration and professional judgment should be applied to any positive drug screen result due to possible interfering substances. A more specific alternate chemical  method must be used in order to obtain a confirmed analytical result. Gas chromatography / mass spectrometry (GC/MS) is the preferred confirmat ory method. Performed at [facility], [address] [place], [place] [address]     Blood Alcohol level:  Lab Results  Component Value Date   [facility] <10 07/02/2017   ETH <5 42/59/5638    Metabolic Disorder Labs:  Lab Results  Component Value Date   HGBA1C 5.3 11/20/2014   No results found for: PROLACTIN Lab Results  Component Value Date   CHOL 260 (H) 09/25/2014   TRIG 462 (H) 09/25/2014   HDL 45 09/25/2014   CHOLHDL 5.8 09/25/2014   VLDL UNABLE TO CALCULATE IF TRIGLYCERIDE OVER 400 mg/dL 09/25/2014   LDLCALC UNABLE TO CALCULATE IF TRIGLYCERIDE OVER 400 mg/dL 09/25/2014    Current Medications: Current Facility-Administered Medications  Medication Dose Route Frequency Provider Last Rate Last Dose  . acetaminophen (TYLENOL) tablet 650 mg  650 mg Oral Q6H PRN Clapacs, Madie Reno, MD      .  albuterol (PROVENTIL HFA;VENTOLIN HFA) 108 (90 Base) MCG/ACT inhaler 2 puff  2 puff Inhalation Q6H PRN Clapacs, John T, MD      . alum & mag hydroxide-simeth (MAALOX/MYLANTA) 200-200-20 MG/5ML suspension 30 mL  30 mL Oral Q4H PRN Clapacs, John T, MD      . amitriptyline (ELAVIL) tablet 50 mg  50 mg Oral QHS Clapacs, Madie Reno, MD   50 mg at 07/03/17 2146  . docusate sodium (COLACE) capsule 100 mg  100 mg Oral BID Clapacs, Madie Reno, MD   100 mg at 07/04/17 0758  . hydrOXYzine (ATARAX/VISTARIL) tablet 50 mg  50 mg Oral TID PRN Clapacs, John T, MD      . levothyroxine (SYNTHROID, LEVOTHROID) tablet 100 mcg  100 mcg Oral Q0600 Clapacs, Madie Reno, MD   100 mcg at 07/04/17 [phone]  . lisinopril (PRINIVIL,ZESTRIL) tablet 30 mg  30 mg Oral Daily Clapacs, Madie Reno, MD   30 mg at 07/04/17 0756  . magnesium hydroxide (MILK OF MAGNESIA) suspension 30 mL  30 mL Oral Daily PRN Clapacs, John T, MD      . meloxicam (MOBIC) tablet 15 mg  15 mg Oral Daily Clapacs, Madie Reno,  MD   15 mg at 07/04/17 0758  . pantoprazole (PROTONIX) EC tablet 40 mg  40 mg Oral Daily Clapacs, Madie Reno, MD   40 mg at 07/04/17 0758  . QUEtiapine (SEROQUEL) tablet 300 mg  300 mg Oral QHS Clapacs, John T, MD   300 mg at 07/03/17 2146  . traZODone (DESYREL) tablet 100 mg  100 mg Oral QHS PRN Clapacs, John T, MD      . venlafaxine [facility]) tablet 50 mg  50 mg Oral BID WC Clapacs, Madie Reno, MD   50 mg at 07/04/17 9233   PTA Medications: Medications Prior to Admission  Medication Sig Dispense Refill Last Dose  . albuterol (PROVENTIL HFA;VENTOLIN HFA) 108 (90 BASE) MCG/ACT inhaler Inhale 2 puffs into the lungs every 4 (four) hours as needed for wheezing or shortness of breath. 1 each 0 Taking  . amitriptyline (ELAVIL) 50 MG tablet Take 100 mg by mouth at bedtime.   Taking  . amLODipine (NORVASC) 10 MG tablet Take 1 tablet (10 mg total) by mouth daily. 30 tablet 2 Taking  . amoxicillin-clavulanate (AUGMENTIN) 875-125 MG tablet Take 1 tablet by mouth 2 (two) times daily. (Patient not taking: Reported on 07/03/2017) 20 tablet 0 Not Taking at --  . benzonatate (TESSALON PERLES) 100 MG capsule Take 1 capsule (100 mg total) by mouth every 6 (six) hours as needed for cough. (Patient not taking: Reported on 05/16/2017) 15 capsule 0 Not Taking at --  . diclofenac sodium (VOLTAREN) 1 % GEL Apply 2 g topically 3 (three) times daily. (Patient not taking: Reported on 05/16/2017) 100 g 0 Not Taking  . docusate sodium (COLACE) 100 MG capsule Take 1 capsule (100 mg total) by mouth daily. 30 capsule 3   . doxycycline (VIBRAMYCIN) 100 MG capsule Take 1 capsule (100 mg total) by mouth 2 (two) times daily. (Patient not taking: Reported on 05/16/2017) 28 capsule 0 Not Taking at --  . ibuprofen (ADVIL,MOTRIN) 800 MG tablet Take 1 tablet (800 mg total) by mouth every 8 (eight) hours as needed. (Patient not taking: Reported on 05/16/2017) 15 tablet 0 Not Taking at --  . loperamide (IMODIUM A-D) 2 MG tablet Take 1 tablet (2 mg  total) by mouth 4 (four) times daily as needed for diarrhea or loose stools. (Patient  not taking: Reported on 06/07/2017) 12 tablet 0 Not Taking at --  . meloxicam (MOBIC) 15 MG tablet Take 1 tablet (15 mg total) by mouth daily. 30 tablet 0 Taking  . methocarbamol (ROBAXIN) 500 MG tablet Take 1 tablet (500 mg total) by mouth 4 (four) times daily. 16 tablet 0 Taking  . omeprazole (PRILOSEC) 20 MG capsule Take 1 capsule (20 mg total) by mouth 2 (two) times daily before a meal. 60 capsule 0 Taking  . ondansetron (ZOFRAN) 4 MG tablet Take 1 tablet (4 mg total) by mouth daily as needed for nausea or vomiting. 20 tablet 1 PRN at PRN  . pravastatin (PRAVACHOL) 10 MG tablet Take 1 tablet (10 mg total) by mouth daily. 30 tablet 2 Taking  . QUEtiapine (SEROQUEL) 25 MG tablet Take 25 mg by mouth 3 (three) times daily.   Taking  . QUEtiapine (SEROQUEL) 300 MG tablet Take 1 tablet (300 mg total) by mouth at bedtime. 30 tablet 0 Taking  . solifenacin (VESICARE) 10 MG tablet Take 1 tab by mouth nightly for overactive bladder 30 tablet 2 Taking  . sucralfate (CARAFATE) 1 g tablet Take 1 tablet (1 g total) by mouth 3 (three) times daily. 30 tablet 5 Taking  . tamsulosin (FLOMAX) 0.4 MG CAPS capsule Take by mouth.   Taking  . thyroid (ARMOUR) 60 MG tablet Take 120 mg by mouth daily before breakfast.   Taking  . venlafaxine (EFFEXOR) 50 MG tablet Take 50 mg by mouth 2 (two) times daily.   Taking    Musculoskeletal: Strength & Muscle Tone: within normal limits Gait & Station: normal Patient leans: N/A  Psychiatric Specialty Exam: Physical Exam  Nursing note and vitals reviewed.   Review of Systems  All other systems reviewed and are negative.   Blood pressure 128/89, pulse 80, temperature 97.8 F (36.6 C), temperature source Oral, resp. rate 20, height 5' 7"  (1.702 m), weight 64.9 kg (143 lb), SpO2 96 %.Body mass index is 22.4 kg/m.  General Appearance: Disheveled  Eye Contact:  Minimal  Speech:   Pressured  Volume:  Increased  Mood:  Irritable and Worthless  Affect:  Labile  Thought Process:  Coherent, perspective on ACT team  Orientation:  Full (Time, Place, and Person)  Thought Content:  Rumination  Suicidal Thoughts:  No  Homicidal Thoughts:  No  Memory:  Immediate;   Fair  Judgement:  Impaired  Insight:  Lacking  Psychomotor Activity:  Normal  Concentration:  Concentration: Poor  Recall:  Poor  Fund of Knowledge:  Fair  Language:  Fair  Akathisia:  No      Assets:  Resilience  ADL's:  Intact  Cognition:  WNL  Sleep:  Number of Hours: 6    Treatment Plan Summary: 57 yo male admitted due to agitation. He is very upset with his ACT team and is very agitated and perseverative on them. He has long history of schizophrenia and agitated behaviors. He is not overtly disorganized and able to give some history but is very perspective on ACT team and continuously reverts back to talking about how much he is upset with them. He is fully oriented. He dose have some notable likely Tardive dyskinesia likely from long history of anti-psychotic use. He has been stable on Seroquel for many years.   Plan:  Schizophrenia -Will continue on Seroquel which he has done well on. Will add 50 mg in the morning for agitation -Continue Effexor 50 mg BID -Continue Elavil 50 mg qhs -  will add Ativan prn for agitation while on the unit  Hypothyroid -Continue Synthroid 100 mcg daily -Pt refused blood draw this morning but willing to get labs later. TSH ordered  HTN -Continue Lisinopril 30 mg daily  EKG done QTc 451  Dispo -Pt is homeless and does not get his check until 4/1. He refuses to stay in a shelter or boarding house. He states that [place] stay in the woods when he [place] until he gets a check for an apartment. He follows with [facility] team but refuses to continue with them. Will look into [facility] ACT team which he is wanting.   Observation Level/Precautions:  15 minute checks   Laboratory:  TSH, Lipid, A1C, UA  Psychotherapy:    Medications:    Consultations:    Discharge Concerns:    Estimated LOS: 5-7 days  Other:     Physician Treatment Plan for Primary Diagnosis: Schizophrenia ([place]) Long Term Goal(s): Improvement in symptoms so as ready for discharge  Short Term Goals: Ability to demonstrate self-control will improve  Physician Treatment Plan for Secondary Diagnosis: Active Problems:   Schizophrenia ([place])    I certify that inpatient services furnished can reasonably be expected to improve the patient's condition.    Marylin Crosby, MD 3/13/201910:41 AM

## 2017-07-04 NOTE — Plan of Care (Signed)
  Patient working on Scientific laboratory technician , noted today verbalizing  feeling more . Emotional and mental status improved from yesterday . Able to come back to staff and apologize  for bad behavior . No anger outburst today . Voice no concerns around anger     Progressing Coping: Ability to cope will improve 07/04/2017 1143 - Progressing by Leodis Liverpool, RN Ability to verbalize feelings will improve 07/04/2017 1143 - Progressing by Leodis Liverpool, RN Education: Emotional status will improve 07/04/2017 1143 - Progressing by Leodis Liverpool, RN Safety: Ability to demonstrate self-control will improve 07/04/2017 1143 - Progressing by Leodis Liverpool, RN Ability to redirect hostility and anger into socially appropriate behaviors will improve 07/04/2017 1143 - Progressing by Leodis Liverpool, RN Activity: Sleeping patterns will improve 07/04/2017 1143 - Progressing by Leodis Liverpool, RN

## 2017-07-05 MED ORDER — LEVOTHYROXINE SODIUM 125 MCG PO TABS
125.0000 ug | ORAL_TABLET | Freq: Every day | ORAL | Status: DC
  Administered 2017-07-06 – 2017-07-23 (×18): 125 ug via ORAL
  Filled 2017-07-05 (×19): qty 1

## 2017-07-05 MED ORDER — PALIPERIDONE ER 3 MG PO TB24
6.0000 mg | ORAL_TABLET | Freq: Every day | ORAL | Status: DC
  Administered 2017-07-05: 6 mg via ORAL
  Filled 2017-07-05: qty 2

## 2017-07-05 NOTE — Plan of Care (Signed)
Patient is alert to himself and unit. Patient denies SI, and HI but continues to have visual hallucinations. This morning patient was pleasant and compliant with medications, patient continues to have flight of idea; during morning assessment patient talked about wanting to go home with his dog that he has had since it was 8 weeks old. Patient complains of being tired and wanting to sleep. Nurse will continue to monitor. Safety check will continue Q 15 minutes. Coping: Ability to cope will improve 07/05/2017 0901 - Progressing by Geraldo Docker, RN Ability to verbalize feelings will improve 07/05/2017 0901 - Progressing by Geraldo Docker, RN   Activity: Sleeping patterns will improve 07/05/2017 0901 - Progressing by Geraldo Docker, RN

## 2017-07-05 NOTE — [facility] Group Notes (Signed)
  07/05/2017 9am  Type of Therapy and Topic: Group Therapy: Goals Group: SMART Goals   Participation Level: Did Not Attend  Description of Group:    The purpose of a daily goals group is to assist and guide patients in setting recovery/wellness-related goals. The objective is to set goals as they relate to the crisis in which they were admitted. Patients will be using SMART goal modalities to set measurable goals. Characteristics of realistic goals will be discussed and patients will be assisted in setting and processing how one will reach their goal. Facilitator will also assist patients in applying interventions and coping skills learned in psycho-education groups to the SMART goal and process how one will achieve defined goal.   Therapeutic Goals:   -Patients will develop and document one goal related to or their crisis in which brought them into treatment.  -Patients will be guided by LCSW using SMART goal setting modality in how to set a measurable, attainable, realistic and time sensitive goal.  -Patients will process barriers in reaching goal.  -Patients will process interventions in how to overcome and successful in reaching goal.   Patient's Goal: Patient was encouraged and invited to attend group. Patient did not attend group. Social worker will continue to encourage group participation in the future.    Therapeutic Modalities:  Motivational Interviewing  Cognitive Behavioral Therapy  Crisis Intervention Model  SMART goals setting  Darin Engels, [place] 07/05/2017 10:01 AM

## 2017-07-05 NOTE — [facility] Group Notes (Signed)
07/05/2017  Time: 1:00PM  Type of Therapy/Topic:  Group Therapy:  Balance in Life  Participation Level:  Did Not Attend  Description of Group:   This group will address the concept of balance and how it feels and looks when one is unbalanced. Patients will be encouraged to process areas in their lives that are out of balance and identify reasons for remaining unbalanced. Facilitators will guide patients in utilizing problem-solving interventions to address and correct the stressor making their life unbalanced. Understanding and applying boundaries will be explored and addressed for obtaining and maintaining a balanced life. Patients will be encouraged to explore ways to assertively make their unbalanced needs known to significant others in their lives, using other group members and facilitator for support and feedback.  Therapeutic Goals: 1. Patient will identify two or more emotions or situations they have that consume much of in their lives. 2. Patient will identify signs/triggers that life has become out of balance:  3. Patient will identify two ways to set boundaries in order to achieve balance in their lives:  4. Patient will demonstrate ability to communicate their needs through discussion and/or role plays  Summary of Patient Progress: Pt was invited to attend group but chose not to attend. CSW will continue to encourage pt to attend group throughout their admission.     Therapeutic Modalities:   Cognitive Behavioral Therapy Solution-Focused Therapy Assertiveness Training   Alden Hipp, MSW, LCSW 07/05/2017 1:57 PM

## 2017-07-05 NOTE — [facility] Group Notes (Signed)
[facility] Group Notes:  (Nursing/MHT/Case Management/Adjunct)  Date:  07/05/2017  Time:  9:32 PM  Type of Therapy:  Group Therapy  Participation Level:  Active  Participation Quality:  Appropriate  Affect:  Appropriate  Cognitive:  Alert  Insight:  Good  Engagement in Group:  Engaged  Modes of Intervention:  Support  Summary of Progress/Problems:  Christopher Mason 07/05/2017, 9:32 PM
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
## 2017-07-06 MED ORDER — PROPRANOLOL HCL 20 MG PO TABS
10.0000 mg | ORAL_TABLET | Freq: Two times a day (BID) | ORAL | Status: DC
  Administered 2017-07-06 – 2017-07-07 (×2): 10 mg via ORAL
  Filled 2017-07-06 (×2): qty 1

## 2017-07-06 MED ORDER — TEMAZEPAM 15 MG PO CAPS
15.0000 mg | ORAL_CAPSULE | Freq: Every evening | ORAL | Status: DC | PRN
  Administered 2017-07-06: 15 mg via ORAL
  Filled 2017-07-06: qty 1

## 2017-07-06 MED ORDER — PALIPERIDONE ER 3 MG PO TB24
9.0000 mg | ORAL_TABLET | Freq: Every day | ORAL | Status: DC
  Administered 2017-07-06 – 2017-07-09 (×4): 9 mg via ORAL
  Filled 2017-07-06 (×4): qty 3

## 2017-07-06 MED ORDER — LOPERAMIDE HCL 2 MG PO CAPS
4.0000 mg | ORAL_CAPSULE | Freq: Once | ORAL | Status: AC
  Administered 2017-07-06: 4 mg via ORAL
  Filled 2017-07-06: qty 2

## 2017-07-06 MED ORDER — VENLAFAXINE HCL ER 75 MG PO CP24
75.0000 mg | ORAL_CAPSULE | Freq: Every day | ORAL | Status: DC
  Administered 2017-07-07 – 2017-07-23 (×17): 75 mg via ORAL
  Filled 2017-07-06 (×17): qty 1

## 2017-07-06 NOTE — [facility] Group Notes (Signed)
LCSW Group Therapy Note  07/06/2017 1:00 pm  Type of Therapy and Topic:  Group Therapy:  Feelings around Relapse and Recovery  Participation Level:  None   Description of Group:    Patients in this group will discuss emotions they experience before and after a relapse. They will process how experiencing these feelings, or avoidance of experiencing them, relates to having a relapse. Facilitator will guide patients to explore emotions they have related to recovery. Patients will be encouraged to process which emotions are more powerful. They will be guided to discuss the emotional reaction significant others in their lives may have to their relapse or recovery. Patients will be assisted in exploring ways to respond to the emotions of others without this contributing to a relapse.  Therapeutic Goals: 1. Patient will identify two or more emotions that lead to a relapse for them 2. Patient will identify two emotions that result when they relapse 3. Patient will identify two emotions related to recovery 4. Patient will demonstrate ability to communicate their needs through discussion and/or role plays   Summary of Patient Progress:  Kyon came to group with about five minutes left of group.  He was unable to actively participate in today's group.   Therapeutic Modalities:   Cognitive Behavioral Therapy Solution-Focused Therapy Assertiveness Training Relapse Prevention Therapy   Devona Konig, [place] 07/06/2017 2:47 PM

## 2017-07-06 NOTE — Progress Notes (Signed)
   07/06/17 1525  Clinical Encounter Type  Visited With Patient  Visit Type Initial;Other (Comment) (group)   Patient participated in chaplain group, building rapport and exploring supports.

## 2017-07-06 NOTE — Plan of Care (Signed)
Patient is alert and oriented X 4. Patient is much more logical with his thinking than before, as well as more approachable. Effect today is pleasant; patient is interacting appropriately with staff and peers discussing football and his pet Silex. Patient is compliant with medications but does want to talk with the Doctor about the medication Effexor, Patient states,"It makes me feel bad." Patient states he is having trouble at night sleeping, but also states he is missing his dog. Patient has no other concerns at this time. Nurse will continue to monitor. Safety checks Q 15 minutes. Coping: Ability to cope will improve 07/06/2017 0935 - Progressing by Geraldo Docker, RN Ability to verbalize feelings will improve 07/06/2017 0935 - Progressing by Geraldo Docker, RN   Education: Knowledge of [place] Education information/materials will improve 07/06/2017 0935 - Progressing by Geraldo Docker, RN Emotional status will improve 07/06/2017 0935 - Progressing by Geraldo Docker, RN   Activity: Sleeping patterns will improve 07/06/2017 0935 - Progressing by Geraldo Docker, RN   Safety: Ability to demonstrate self-control will improve 07/06/2017 0935 - Progressing by Geraldo Docker, RN Ability to redirect hostility and anger into socially appropriate behaviors will improve 07/06/2017 0935 - Progressing by Geraldo Docker, RN
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
## 2017-07-06 NOTE — Plan of Care (Signed)
Patient is socializing well in the unit, seen with peers in the milieu  area , mood and affect is bright states "I feel good today, no concerns, patient contract for safety of self and others , denies SI/HI and no signs of AVH. Patient is complying with his medicines no acute distress noted. Progressing Coping: Ability to cope will improve 07/06/2017 0018 - Progressing by Clemens Catholic, RN Ability to verbalize feelings will improve 07/06/2017 0018 - Progressing by Clemens Catholic, RN Education: Knowledge of [place] Education information/materials will improve 07/06/2017 0018 - Progressing by Clemens Catholic, RN Emotional status will improve 07/06/2017 0018 - Progressing by Clemens Catholic, RN Safety: Ability to demonstrate self-control will improve 07/06/2017 0018 - Progressing by Clemens Catholic, RN Ability to redirect hostility and anger into socially appropriate behaviors will improve 07/06/2017 0018 - Progressing by Clemens Catholic, RN Activity: Sleeping patterns will improve 07/06/2017 0018 - Progressing by Clemens Catholic, RN Education: Knowledge of [place] Education information will improve 07/06/2017 0018 - Progressing by Clemens Catholic, RN Health Behavior/Discharge Planning: Ability to manage health-related needs will improve 07/06/2017 0018 - Progressing by Clemens Catholic, RN Clinical Measurements: Ability to maintain clinical measurements within normal limits will improve 07/06/2017 0018 - Progressing by Clemens Catholic, RN Will remain free from infection 07/06/2017 0018 - Progressing by Clemens Catholic, RN Diagnostic test results will improve 07/06/2017 0018 - Progressing by Clemens Catholic, RN Respiratory complications will improve 07/06/2017 0018 - Progressing by Clemens Catholic, RN Cardiovascular complication will be avoided 07/06/2017 0018 - Progressing by Clemens Catholic, RN Activity: Risk for activity intolerance will  decrease 07/06/2017 0018 - Progressing by Clemens Catholic, RN Nutrition: Adequate nutrition will be maintained 07/06/2017 0018 - Progressing by Clemens Catholic, RN Coping: Level of anxiety will decrease 07/06/2017 0018 - Progressing by Clemens Catholic, RN Elimination: Will not experience complications related to bowel motility 07/06/2017 0018 - Progressing by Clemens Catholic, RN Will not experience complications related to urinary retention 07/06/2017 0018 - Progressing by Clemens Catholic, RN Pain Managment: General experience of comfort will improve 07/06/2017 0018 - Progressing by Clemens Catholic, RN Safety: Ability to remain free from injury will improve 07/06/2017 0018 - Progressing by Clemens Catholic, RN Skin Integrity: Risk for impaired skin integrity will decrease 07/06/2017 0018 - Progressing by Clemens Catholic, RN

## 2017-07-07 DIAGNOSIS — F200 Paranoid schizophrenia: Secondary | ICD-10-CM

## 2017-07-07 MED ORDER — PROPRANOLOL HCL 20 MG PO TABS
10.0000 mg | ORAL_TABLET | Freq: Three times a day (TID) | ORAL | Status: DC
Start: 2017-07-07 — End: 2017-07-23
  Administered 2017-07-07 – 2017-07-23 (×47): 10 mg via ORAL
  Filled 2017-07-07 (×48): qty 1

## 2017-07-07 MED ORDER — TEMAZEPAM 15 MG PO CAPS
30.0000 mg | ORAL_CAPSULE | Freq: Every evening | ORAL | Status: DC | PRN
Start: 2017-07-07 — End: 2017-07-10
  Administered 2017-07-07 – 2017-07-09 (×3): 30 mg via ORAL
  Filled 2017-07-07 (×3): qty 2

## 2017-07-07 NOTE — Plan of Care (Addendum)
Working on Scientific laboratory technician , able to verbalize feeling. Information give in concrete  form . Patient mental and emotional status  improved . Patient not yelling  at staff  no outburst  . Encourage patient to wash hands. No respiratory or cardiac  concerns from  patient. Appetite good. Decrease anxiety.  Voice no concerns around his bowels.

## 2017-07-07 NOTE — Plan of Care (Signed)
Pt. Verbalizes understanding of provided education. Pt. Reports eating and sleeping good. Pt. Denies any pain this evening. Pt. Denies SI/HI. Pt. Verbally contracts for safety and verbalizes he can remain safe while on the unit.    Progressing Education: Knowledge of [place] Education information/materials will improve 07/07/2017 0025 - Progressing by Reyes Ivan, RN Education: Knowledge of General Education information will improve 07/07/2017 0025 - Progressing by Reyes Ivan, RN Nutrition: Adequate nutrition will be maintained 07/07/2017 0025 - Progressing by Reyes Ivan, RN Pain Managment: General experience of comfort will improve 07/07/2017 0025 - Progressing by Reyes Ivan, RN Safety: Ability to remain free from injury will improve 07/07/2017 0025 - Progressing by Reyes Ivan, RN

## 2017-07-07 NOTE — Plan of Care (Signed)
Working on Scientific laboratory technician , able to verbalize feeling. Information give in concrete  form . Patient mental and emotional status  improved . Patient not yelling  at staff  no outburst  . Encourage patient to wash hands. No respiratory or cardiac  concerns from  patient. Appetite good. Decrease anxiety.  Voice no concerns around his bowels.   Progressing Coping: Ability to cope will improve 07/07/2017 1029 - Progressing by Leodis Liverpool, RN 07/07/2017 1020 - Progressing by Leodis Liverpool, RN Ability to verbalize feelings will improve 07/07/2017 1029 - Progressing by Leodis Liverpool, RN 07/07/2017 1020 - Progressing by Leodis Liverpool, RN Education: Knowledge of [place] Education information/materials will improve 07/07/2017 1029 - Progressing by Leodis Liverpool, RN 07/07/2017 1020 - Progressing by Leodis Liverpool, RN Emotional status will improve 07/07/2017 1029 - Progressing by Leodis Liverpool, RN 07/07/2017 1020 - Progressing by Leodis Liverpool, RN Safety: Ability to demonstrate self-control will improve 07/07/2017 1029 - Progressing by Leodis Liverpool, RN 07/07/2017 1020 - Progressing by Leodis Liverpool, RN Ability to redirect hostility and anger into socially appropriate behaviors will improve 07/07/2017 1029 - Progressing by Leodis Liverpool, RN 07/07/2017 1020 - Progressing by Leodis Liverpool, RN Activity: Sleeping patterns will improve 07/07/2017 1029 - Progressing by Leodis Liverpool, RN 07/07/2017 1020 - Progressing by Leodis Liverpool, RN Education: Knowledge of General Education information will improve 07/07/2017 1029 - Progressing by Leodis Liverpool, RN 07/07/2017 1020 - Progressing by Leodis Liverpool, RN Health Behavior/Discharge Planning: Ability to manage health-related needs will improve 07/07/2017 1029 - Progressing by Leodis Liverpool, RN 07/07/2017 1020 - Progressing by Leodis Liverpool, RN Clinical Measurements: Ability to maintain clinical measurements within normal  limits will improve 07/07/2017 1029 - Progressing by Leodis Liverpool, RN 07/07/2017 1020 - Progressing by Leodis Liverpool, RN Will remain free from infection 07/07/2017 1029 - Progressing by Leodis Liverpool, RN 07/07/2017 1020 - Progressing by Leodis Liverpool, RN Diagnostic test results will improve 07/07/2017 1029 - Progressing by Leodis Liverpool, RN 07/07/2017 1020 - Progressing by Leodis Liverpool, RN Respiratory complications will improve 07/07/2017 1029 - Progressing by Leodis Liverpool, RN 07/07/2017 1020 - Progressing by Leodis Liverpool, RN Cardiovascular complication will be avoided 07/07/2017 1029 - Progressing by Leodis Liverpool, RN 07/07/2017 1020 - Progressing by Leodis Liverpool, RN Activity: Risk for activity intolerance will decrease 07/07/2017 1029 - Progressing by Leodis Liverpool, RN 07/07/2017 1020 - Progressing by Leodis Liverpool, RN Nutrition: Adequate nutrition will be maintained 07/07/2017 1029 - Progressing by Leodis Liverpool, RN 07/07/2017 1020 - Progressing by Leodis Liverpool, RN Coping: Level of anxiety will decrease 07/07/2017 1029 - Progressing by Leodis Liverpool, RN 07/07/2017 1020 - Progressing by Leodis Liverpool, RN Elimination: Will not experience complications related to bowel motility 07/07/2017 1029 - Progressing by Leodis Liverpool, RN 07/07/2017 1020 - Progressing by Leodis Liverpool, RN Will not experience complications related to urinary retention 07/07/2017 1029 - Progressing by Leodis Liverpool, RN 07/07/2017 1020 - Progressing by Leodis Liverpool, RN Pain Managment: General experience of comfort will improve 07/07/2017 1029 - Progressing by Leodis Liverpool, RN 07/07/2017 1020 - Progressing by Leodis Liverpool, RN Safety: Ability to remain free from injury will improve 07/07/2017 1029 - Progressing by Leodis Liverpool, RN 07/07/2017 1020 - Progressing by Leodis Liverpool, RN Skin Integrity: Risk for impaired  skin integrity will decrease 07/07/2017 1029 - Progressing  by Leodis Liverpool, RN 07/07/2017 1020 - Progressing by Leodis Liverpool, RN
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
## 2017-07-07 NOTE — [facility] Group Notes (Signed)
LCSW Group Therapy Note  07/07/2017 1:15pm  Type of Therapy and Topic:  Group Therapy:  Fears and Unhealthy Coping Skills  Participation Level:  Did Not Attend   Description of Group:  The focus of this group was to discuss some of the prevalent fears that patients experience, and to identify the commonalities among group members.  An exercise was used to initiate the discussion, followed by writing on the white board a group-generated list of unhealthy coping and healthy coping techniques to deal with each fear.    Therapeutic Goals: 1. Patient will identify and describe 3 fears they experience 2. Patient will identify one positive coping strategy for each fear they experience 3. Patient will respond empathically to peers statements regarding fears they experience  Summary of Patient Progress:       Therapeutic Modalities Cognitive Behavioral Therapy Motivational Interviewing  [place], LCSW 07/07/2017 9:59 AM

## 2017-07-07 NOTE — Progress Notes (Signed)
D:Pt denies SI/HI/AVH. Contracts for safety. Pt is pleasant and cooperative and very engaging. Pt. Has no Complaints.  Patient Interaction is pleasant and likes to talk about working on cars and music. Pt. Presents disheveled. Pt. Can be tangential and loud at times. Pt. A little fidgety at times. Interacts with staff and peers appropriately.   A: Q x 15 minute observation checks were completed for safety. Patient was provided with education. Patient was given scheduled medications. Patient  was encourage to attend groups, participate in unit activities and continue with plan of care.   R:Patient is complaint with medication and unit procedures. Pt. Does not attend group though.              Precautionary checks every 15 minutes for safety maintained, room free of safety hazards, patient sustains no injury or falls during this shift.

## 2017-07-08 NOTE — Plan of Care (Signed)
Pt. Reports doing, "good" today. Pt. Verbalizes understanding of provided education. Pt. Sleeping adequate per sleep reports. Pt. Eating good. Pt. Able to remain safe while on the unit. Contracts for safety. Pt. Denies SI/HI.   Progressing Coping: Ability to cope will improve 07/08/2017 0114 - Progressing by Reyes Ivan, RN Ability to verbalize feelings will improve 07/08/2017 0114 - Progressing by Reyes Ivan, RN Education: Knowledge of [place] Education information/materials will improve 07/08/2017 0114 - Progressing by Reyes Ivan, RN Emotional status will improve 07/08/2017 0114 - Progressing by Reyes Ivan, RN Activity: Sleeping patterns will improve 07/08/2017 0114 - Progressing by Reyes Ivan, RN Education: Knowledge of General Education information will improve 07/08/2017 0114 - Progressing by Reyes Ivan, RN Nutrition: Adequate nutrition will be maintained 07/08/2017 0114 - Progressing by Reyes Ivan, RN Safety: Ability to remain free from injury will improve 07/08/2017 0114 - Progressing by Reyes Ivan, RN

## 2017-07-08 NOTE — Progress Notes (Signed)
D:Pt denies SI/HI/AVH. Contracts for safety. Pt is pleasant and cooperative and very engaging. Pt. Has no Complaints.Patient Interaction is pleasant and likes to talk about working on cars and music during any interaction often. Pt. Presents disheveled. Pt. still tangential loud at times often, but can hold conversations that are productive. Pt. Physically figety often. Interacts with staff and peers appropriately. Pt. Eating good per reports and sleeping adequate. Pt. Reports doing, "good" today.    A: Q x 15 minute observation checks were completed for safety. Patient was provided with education. Patient was given scheduled medications. Patient was encourage to attend groups, participate in unit activities and continue with plan of care.   R:Patient is complaint with medication and unit procedures. Pt. Does attend groups.               Precautionary checks every 15 minutes for safety maintained, room free of safety hazards, patient sustains no injury or falls during this shift.
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
## 2017-07-08 NOTE — Plan of Care (Signed)
Patient is alert and oriented x 4; patient is very childlike; compliant with medications and participates with groups. Patient is very euphoric, and pleasant when speaking with staff and peers. Patient denies SI, HI and AVH. Patient has no concerns to address at this moment. Nurse will continue to monitor. Coping: Ability to cope will improve 07/08/2017 1349 - Progressing by Geraldo Docker, RN Ability to verbalize feelings will improve 07/08/2017 1349 - Progressing by Geraldo Docker, RN   Education: Knowledge of [place] Education information/materials will improve 07/08/2017 1349 - Progressing by Geraldo Docker, RN Emotional status will improve 07/08/2017 1349 - Progressing by Geraldo Docker, RN   Activity: Sleeping patterns will improve 07/08/2017 1349 - Progressing by Geraldo Docker, RN

## 2017-07-08 NOTE — Plan of Care (Signed)
Pt is alert an d orient X4. Pt interact with peers without any behavioral issues. Pt denies AH/VH/SI/HI/PAIN this shift thus far. Pt stated, "he is ready to leave and get back to his little doggie". Pt is medication compliant. His ability to cope with others has improve greatly. His able to verbalize his feelings by saying"I need some sleep medication so I will be rested in the morning". Pt remains on Q 15 mins safety rounds. Will cont to monitor pt.  Progressing Coping: Ability to cope will improve 07/08/2017 2352 - Progressing by Corinna Capra, RN Ability to verbalize feelings will improve 07/08/2017 2352 - Progressing by Corinna Capra, RN Education: Knowledge of [place] Education information/materials will improve 07/08/2017 2352 - Progressing by Corinna Capra, RN Emotional status will improve 07/08/2017 2352 - Progressing by Corinna Capra, RN Safety: Ability to demonstrate self-control will improve 07/08/2017 2352 - Progressing by Corinna Capra, RN Ability to redirect hostility and anger into socially appropriate behaviors will improve 07/08/2017 2352 - Progressing by Corinna Capra, RN Activity: Sleeping patterns will improve 07/08/2017 2352 - Progressing by Corinna Capra, RN Education: Knowledge of [place] Education information will improve 07/08/2017 2352 - Progressing by Corinna Capra, RN Health Behavior/Discharge Planning: Ability to manage health-related needs will improve 07/08/2017 2352 - Progressing by Corinna Capra, RN Clinical Measurements: Ability to maintain clinical measurements within normal limits will improve 07/08/2017 2352 - Progressing by Corinna Capra, RN Will remain free from infection 07/08/2017 2352 - Progressing by Corinna Capra, RN Diagnostic test results will improve 07/08/2017 2352 - Progressing by Corinna Capra, RN Respiratory complications will improve 07/08/2017 2352 - Progressing by Corinna Capra, RN Cardiovascular  complication will be avoided 07/08/2017 2352 - Progressing by Renato Battles A, RN Activity: Risk for activity intolerance will decrease 07/08/2017 2352 - Progressing by Corinna Capra, RN Nutrition: Adequate nutrition will be maintained 07/08/2017 2352 - Progressing by Corinna Capra, RN Coping: Level of anxiety will decrease 07/08/2017 2352 - Progressing by Renato Battles A, RN Elimination: Will not experience complications related to bowel motility 07/08/2017 2352 - Progressing by Corinna Capra, RN Will not experience complications related to urinary retention 07/08/2017 2352 - Progressing by Corinna Capra, RN Pain Managment: General experience of comfort will improve 07/08/2017 2352 - Progressing by Corinna Capra, RN Safety: Ability to remain free from injury will improve 07/08/2017 2352 - Progressing by Corinna Capra, RN Skin Integrity: Risk for impaired skin integrity will decrease 07/08/2017 2352 - Progressing by Corinna Capra, RN

## 2017-07-08 NOTE — [facility] Group Notes (Signed)
[facility] Group Notes:  (Nursing/MHT/Case Management/Adjunct)  Date:  07/08/2017  Time:  3:37 AM  Type of Therapy:  Group Therapy  Participation Level:  Active  Participation Quality:  Sharing  Affect:  Came in late.  Cognitive:  Appropriate  Insight:  Good  Engagement in Group:  Engaged  Modes of Intervention:  Support  Summary of Progress/Problems:  Nehemiah Settle 07/08/2017, 3:37 AM

## 2017-07-08 NOTE — [facility] Group Notes (Signed)
LCSW Group Therapy Note 07/08/2017 1:15pm  Type of Therapy and Topic: Group Therapy: Feelings Around Returning Home & Establishing a Supportive Framework and Supporting Oneself When Supports Not Available  Participation Level: Active  Description of Group:  Patients first processed thoughts and feelings about upcoming discharge. These included fears of upcoming changes, lack of change, new living environments, judgements and expectations from others and overall stigma of mental health issues. The group then discussed the definition of a supportive framework, what that looks and feels like, and how do to discern it from an unhealthy non-supportive network. The group identified different types of supports as well as what to do when your family/friends are less than helpful or unavailable  Therapeutic Goals  1. Patient will identify one healthy supportive network that they can use at discharge. 2. Patient will identify one factor of a supportive framework and how to tell it from an unhealthy network. 3. Patient able to identify one coping skill to use when they do not have positive supports from others. 4. Patient will demonstrate ability to communicate their needs through discussion and/or role plays.  Summary of Patient Progress:  Pt engaged during group session. As patients processed their anxiety about discharge and described healthy supports patient shared that he feels tired. Pt reported he is anxious about where he is going once he gets discharge since he does not have stable housing and where ever he goes his dog is coming with him. 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 Patients identified at least one self-care tool they were willing to use after discharge.   Therapeutic Modalities Cognitive Behavioral Therapy Motivational Interviewing   Michiel Sivley  CUEBAS-COLON, LCSW 07/08/2017 12:18 PM

## 2017-07-09 DIAGNOSIS — F203 Undifferentiated schizophrenia: Principal | ICD-10-CM

## 2017-07-09 DIAGNOSIS — G255 Other chorea: Secondary | ICD-10-CM

## 2017-07-09 MED ORDER — PALIPERIDONE PALMITATE 234 MG/1.5ML IM SUSP
234.0000 mg | INTRAMUSCULAR | Status: DC
  Administered 2017-07-09: 234 mg via INTRAMUSCULAR
  Filled 2017-07-09: qty 1.5

## 2017-07-09 NOTE — Tx Team (Signed)
Interdisciplinary Treatment and Diagnostic Plan Update  07/09/2017 Time of Session:10:30AM Christopher Mason MRN: [id number]  Principal Diagnosis: Schizophrenia [facility])  Secondary Diagnoses: Principal Problem:   Schizophrenia ([place]) Active Problems:   Tardive dyskinesia   Current Medications:  Current Facility-Administered Medications  Medication Dose Route Frequency Provider Last Rate Last Dose  . acetaminophen (TYLENOL) tablet 650 mg  650 mg Oral Q6H PRN Clapacs, John T, MD      . albuterol (PROVENTIL HFA;VENTOLIN HFA) 108 (90 Base) MCG/ACT inhaler 2 puff  2 puff Inhalation Q6H PRN Clapacs, John T, MD      . alum & mag hydroxide-simeth (MAALOX/MYLANTA) 200-200-20 MG/5ML suspension 30 mL  30 mL Oral Q4H PRN Clapacs, John T, MD      . docusate sodium (COLACE) capsule 100 mg  100 mg Oral BID Clapacs, Madie Reno, MD   100 mg at 07/09/17 0825  . levothyroxine (SYNTHROID, LEVOTHROID) tablet 125 mcg  125 mcg Oral Q0600 Marylin Crosby, MD   125 mcg at 07/09/17 [phone]  . lisinopril (PRINIVIL,ZESTRIL) tablet 30 mg  30 mg Oral Daily Clapacs, Madie Reno, MD   30 mg at 07/09/17 0825  . LORazepam (ATIVAN) tablet 1 mg  1 mg Oral Q6H PRN McNew, Holly R, MD      . magnesium hydroxide (MILK OF MAGNESIA) suspension 30 mL  30 mL Oral Daily PRN Clapacs, John T, MD      . meloxicam (MOBIC) tablet 15 mg  15 mg Oral Daily Clapacs, Madie Reno, MD   15 mg at 07/09/17 0825  . paliperidone (INVEGA SUSTENNA) injection 234 mg  234 mg Intramuscular Q28 days Pucilowska, Jolanta B, MD      . paliperidone (INVEGA) 24 hr tablet 9 mg  9 mg Oral QHS McNew, Tyson Babinski, MD   9 mg at 07/08/17 2135  . pantoprazole (PROTONIX) EC tablet 40 mg  40 mg Oral Daily Clapacs, Madie Reno, MD   40 mg at 07/09/17 0825  . propranolol (INDERAL) tablet 10 mg  10 mg Oral TID Lenward Chancellor, MD   10 mg at 07/09/17 1219  . QUEtiapine (SEROQUEL) tablet 50 mg  50 mg Oral Q6H PRN McNew, Holly R, MD      . temazepam (RESTORIL) capsule 30 mg  30 mg Oral QHS PRN  Lenward Chancellor, MD   30 mg at 07/08/17 2135  . venlafaxine XR (EFFEXOR-XR) 24 hr capsule 75 mg  75 mg Oral Q breakfast McNew, Tyson Babinski, MD   75 mg at 07/09/17 0825   PTA Medications: Medications Prior to Admission  Medication Sig Dispense Refill Last Dose  . albuterol (PROVENTIL HFA;VENTOLIN HFA) 108 (90 BASE) MCG/ACT inhaler Inhale 2 puffs into the lungs every 4 (four) hours as needed for wheezing or shortness of breath. 1 each 0 Taking  . amitriptyline (ELAVIL) 50 MG tablet Take 100 mg by mouth at bedtime.   Taking  . amLODipine (NORVASC) 10 MG tablet Take 1 tablet (10 mg total) by mouth daily. 30 tablet 2 Taking  . amoxicillin-clavulanate (AUGMENTIN) 875-125 MG tablet Take 1 tablet by mouth 2 (two) times daily. (Patient not taking: Reported on 07/03/2017) 20 tablet 0 Not Taking at --  . benzonatate (TESSALON PERLES) 100 MG capsule Take 1 capsule (100 mg total) by mouth every 6 (six) hours as needed for cough. (Patient not taking: Reported on 05/16/2017) 15 capsule 0 Not Taking at --  . diclofenac sodium (VOLTAREN) 1 % GEL Apply 2 g topically 3 (three) times daily. (Patient  not taking: Reported on 05/16/2017) 100 g 0 Not Taking  . docusate sodium (COLACE) 100 MG capsule Take 1 capsule (100 mg total) by mouth daily. 30 capsule 3   . doxycycline (VIBRAMYCIN) 100 MG capsule Take 1 capsule (100 mg total) by mouth 2 (two) times daily. (Patient not taking: Reported on 05/16/2017) 28 capsule 0 Not Taking at --  . ibuprofen (ADVIL,MOTRIN) 800 MG tablet Take 1 tablet (800 mg total) by mouth every 8 (eight) hours as needed. (Patient not taking: Reported on 05/16/2017) 15 tablet 0 Not Taking at --  . loperamide (IMODIUM A-D) 2 MG tablet Take 1 tablet (2 mg total) by mouth 4 (four) times daily as needed for diarrhea or loose stools. (Patient not taking: Reported on 06/07/2017) 12 tablet 0 Not Taking at --  . meloxicam (MOBIC) 15 MG tablet Take 1 tablet (15 mg total) by mouth daily. 30 tablet 0 Taking  .  methocarbamol (ROBAXIN) 500 MG tablet Take 1 tablet (500 mg total) by mouth 4 (four) times daily. 16 tablet 0 Taking  . omeprazole (PRILOSEC) 20 MG capsule Take 1 capsule (20 mg total) by mouth 2 (two) times daily before a meal. 60 capsule 0 Taking  . ondansetron (ZOFRAN) 4 MG tablet Take 1 tablet (4 mg total) by mouth daily as needed for nausea or vomiting. 20 tablet 1 PRN at PRN  . pravastatin (PRAVACHOL) 10 MG tablet Take 1 tablet (10 mg total) by mouth daily. 30 tablet 2 Taking  . QUEtiapine (SEROQUEL) 25 MG tablet Take 25 mg by mouth 3 (three) times daily.   Taking  . QUEtiapine (SEROQUEL) 300 MG tablet Take 1 tablet (300 mg total) by mouth at bedtime. 30 tablet 0 Taking  . solifenacin (VESICARE) 10 MG tablet Take 1 tab by mouth nightly for overactive bladder 30 tablet 2 Taking  . sucralfate (CARAFATE) 1 g tablet Take 1 tablet (1 g total) by mouth 3 (three) times daily. 30 tablet 5 Taking  . tamsulosin (FLOMAX) 0.4 MG CAPS capsule Take by mouth.   Taking  . thyroid (ARMOUR) 60 MG tablet Take 120 mg by mouth daily before breakfast.   Taking  . venlafaxine (EFFEXOR) 50 MG tablet Take 50 mg by mouth 2 (two) times daily.   Taking    Patient Stressors: Educational concerns Financial difficulties Health problems Legal issue Medication change or noncompliance Substance abuse  Patient Strengths: Ability for insight Capable of independent living Communication skills Religious Affiliation Supportive family/friends  Treatment Modalities: Medication Management, Group therapy, Case management,  1 to 1 session with clinician, Psychoeducation, Recreational therapy.   Physician Treatment Plan for Primary Diagnosis: Schizophrenia ([place]) Long Term Goal(s): Improvement in symptoms so as ready for discharge   Short Term Goals: Ability to demonstrate self-control will improve  Medication Management: Evaluate patient's response, side effects, and tolerance of medication regimen.  Therapeutic  Interventions: 1 to 1 sessions, Unit Group sessions and Medication administration.  Evaluation of Outcomes: Progressing  Physician Treatment Plan for Secondary Diagnosis: Principal Problem:   Schizophrenia ([place]) Active Problems:   Tardive dyskinesia  Long Term Goal(s): Improvement in symptoms so as ready for discharge   Short Term Goals: Ability to demonstrate self-control will improve     Medication Management: Evaluate patient's response, side effects, and tolerance of medication regimen.  Therapeutic Interventions: 1 to 1 sessions, Unit Group sessions and Medication administration.  Evaluation of Outcomes: Progressing   RN Treatment Plan for Primary Diagnosis: Schizophrenia ([place]) Long Term Goal(s): Knowledge of disease and  therapeutic regimen to maintain health will improve  Short Term Goals: Ability to verbalize frustration and anger appropriately will improve, Ability to demonstrate self-control and Ability to identify and develop effective coping behaviors will improve  Medication Management: RN will administer medications as ordered by provider, will assess and evaluate patient's response and provide education to patient for prescribed medication. RN will report any adverse and/or side effects to prescribing provider.  Therapeutic Interventions: 1 on 1 counseling sessions, Psychoeducation, Medication administration, Evaluate responses to treatment, Monitor vital signs and CBGs as ordered, Perform/monitor CIWA, COWS, AIMS and Fall Risk screenings as ordered, Perform wound care treatments as ordered.  Evaluation of Outcomes: Progressing   LCSW Treatment Plan for Primary Diagnosis: Schizophrenia ([place]) Long Term Goal(s): Safe transition to appropriate next level of care at discharge, Engage patient in therapeutic group addressing interpersonal concerns.  Short Term Goals: Engage patient in aftercare planning with referrals and resources, Increase social support, Increase ability  to appropriately verbalize feelings, Increase emotional regulation, Identify triggers associated with mental health/substance abuse issues and Increase skills for wellness and recovery  Therapeutic Interventions: Assess for all discharge needs, 1 to 1 time with Social worker, Explore available resources and support systems, Assess for adequacy in community support network, Educate family and significant other(s) on suicide prevention, Complete Psychosocial Assessment, Interpersonal group therapy.  Evaluation of Outcomes: Progressing   Progress in Treatment: Attending groups: Yes. Participating in groups: Yes. Taking medication as prescribed: Yes. Toleration medication: Yes. Family/Significant other contact made:Yes,  Attempted to call [name], Patients Girlfriend. Unable to get in touch with her/ ACTT Team contacted.  Patient understands diagnosis: Yes. Discussing patient identified problems/goals with staff: Yes. Medical problems stabilized or resolved: Yes. Denies suicidal/homicidal ideation: Yes. Issues/concerns per patient self-inventory: No. Other:   New problem(s) identified: No, Describe:  None  New Short Term/Long Term Goal(s): "To get me a place to live."  Discharge Plan or Barriers: To go to a shelter if placement is not secured and continue to work with ACTT team.   Reason for Continuation of Hospitalization: Medication stabilization  Estimated Length of Stay: 3-5 days  Attendees: Patient:  07/09/2017 3:02 PM  Physician: Dr. Bary Leriche, MD 07/09/2017 3:02 PM  Nursing:  07/09/2017 3:02 PM  RN Care Manager: 07/09/2017 3:02 PM  Social Worker: Darin Engels, [place] 07/09/2017 3:02 PM  Recreational Therapist:  07/09/2017 3:02 PM  Other: Dossie Arbour, LCSW 07/09/2017 3:02 PM  Other: Alden Hipp, LCSW 07/09/2017 3:02 PM  Other: 07/09/2017 3:02 PM    Scribe for Treatment Team: Darin Engels, LCSW 07/09/2017 3:02 PM

## 2017-07-09 NOTE — Plan of Care (Signed)
Patient is alert and oriented X 4. Patient denies SI, HI and AVH. Patient has been very euphoric and pleasant today; laughing and joking with staff and peers. Patient is compliant with medications, attends groups and eating meals well. 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 Patient very cheerful and still excited due to visit yesterday from his girlfriend. Nurse will continue to monitor. Safety checks Q 15 minutes. Coping: Ability to cope will improve 07/09/2017 1625 - Progressing by Geraldo Docker, RN Ability to verbalize feelings will improve 07/09/2017 1625 - Progressing by Geraldo Docker, RN   Education: Knowledge of [place] Education information/materials will improve 07/09/2017 1625 - Progressing by Geraldo Docker, RN Emotional status will improve 07/09/2017 1625 - Progressing by Geraldo Docker, RN   Activity: Sleeping patterns will improve 07/09/2017 1625 - Progressing by Geraldo Docker, RN   Safety: Ability to demonstrate self-control will improve 07/09/2017 1625 - Progressing by Geraldo Docker, RN Ability to redirect hostility and anger into socially appropriate behaviors will improve 07/09/2017 1625 - Progressing by Geraldo Docker, RN
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
## 2017-07-09 NOTE — Plan of Care (Signed)
Anxiety symptoms are present , patient reports feeling apprehensive, thinking about his dog and feeling sad, there have been fewer instances of impulsive behavior, compliance with medication is good, described medications as effective and no noticeable side effects, patient denies any SI/HI and contract for safety of self and others, 15 minute rounding maintained. Progressing Coping: Ability to cope will improve 07/09/2017 2057 - Progressing by Clemens Catholic, RN Ability to verbalize feelings will improve 07/09/2017 2057 - Progressing by Clemens Catholic, RN Education: Knowledge of [place] Education information/materials will improve 07/09/2017 2057 - Progressing by Clemens Catholic, RN Emotional status will improve 07/09/2017 2057 - Progressing by Clemens Catholic, RN Safety: Ability to demonstrate self-control will improve 07/09/2017 2057 - Progressing by Clemens Catholic, RN Ability to redirect hostility and anger into socially appropriate behaviors will improve 07/09/2017 2057 - Progressing by Clemens Catholic, RN Activity: Sleeping patterns will improve 07/09/2017 2057 - Progressing by Clemens Catholic, RN Education: Knowledge of General Education information will improve 07/09/2017 2057 - Progressing by Clemens Catholic, RN Health Behavior/Discharge Planning: Ability to manage health-related needs will improve 07/09/2017 2057 - Progressing by Clemens Catholic, RN Clinical Measurements: Ability to maintain clinical measurements within normal limits will improve 07/09/2017 2057 - Progressing by Clemens Catholic, RN Will remain free from infection 07/09/2017 2057 - Progressing by Clemens Catholic, RN Diagnostic test results will improve 07/09/2017 2057 - Progressing by Clemens Catholic, RN Respiratory complications will improve 07/09/2017 2057 - Progressing by Clemens Catholic, RN Cardiovascular complication will be avoided 07/09/2017 2057 -  Progressing by Clemens Catholic, RN Activity: Risk for activity intolerance will decrease 07/09/2017 2057 - Progressing by Clemens Catholic, RN Nutrition: Adequate nutrition will be maintained 07/09/2017 2057 - Progressing by Clemens Catholic, RN Coping: Level of anxiety will decrease 07/09/2017 2057 - Progressing by Clemens Catholic, RN Elimination: Will not experience complications related to bowel motility 07/09/2017 2057 - Progressing by Clemens Catholic, RN Will not experience complications related to urinary retention 07/09/2017 2057 - Progressing by Clemens Catholic, RN Pain Managment: General experience of comfort will improve 07/09/2017 2057 - Progressing by Clemens Catholic, RN Safety: Ability to remain free from injury will improve 07/09/2017 2057 - Progressing by Clemens Catholic, RN Skin Integrity: Risk for impaired skin integrity will decrease 07/09/2017 2057 - Progressing by Clemens Catholic, RN

## 2017-07-10 ENCOUNTER — Other Ambulatory Visit: Payer: Self-pay

## 2017-07-10 MED ORDER — PANTOPRAZOLE SODIUM 40 MG PO TBEC: 40.0000 mg | DELAYED_RELEASE_TABLET | Freq: Every day | ORAL | 5 refills | Status: DC

## 2017-07-10 MED ORDER — CLONIDINE HCL 0.1 MG PO TABS
0.1000 mg | ORAL_TABLET | Freq: Two times a day (BID) | ORAL | Status: DC | PRN
  Administered 2017-07-12 – 2017-07-22 (×5): 0.1 mg via ORAL
  Filled 2017-07-10 (×6): qty 1

## 2017-07-10 MED ORDER — LISINOPRIL 20 MG PO TABS
40.0000 mg | ORAL_TABLET | Freq: Every day | ORAL | Status: DC
  Administered 2017-07-11 – 2017-07-23 (×13): 40 mg via ORAL
  Filled 2017-07-10 (×13): qty 2

## 2017-07-10 MED ORDER — TEMAZEPAM 15 MG PO CAPS
15.0000 mg | ORAL_CAPSULE | Freq: Every evening | ORAL | Status: DC | PRN
  Administered 2017-07-10: 15 mg via ORAL
  Filled 2017-07-10: qty 1

## 2017-07-10 NOTE — Progress Notes (Signed)
Patient was up this morning for V/S and showing signs of  Unsteadiness  with his gait , patient is escorted back to his room , patient denies any fall noted.

## 2017-07-10 NOTE — [facility] Group Notes (Signed)
LCSW Group Therapy Note   07/09/2017 1:00pm   Type of Therapy and Topic:  Group Therapy:  Overcoming Obstacles   Participation Level:  Active   Description of Group:    In this group patients will be encouraged to explore what they see as obstacles to their own wellness and recovery. They will be guided to discuss their thoughts, feelings, and behaviors related to these obstacles. The group will process together ways to cope with barriers, with attention given to specific choices patients can make. Each patient will be challenged to identify changes they are motivated to make in order to overcome their obstacles. This group will be process-oriented, with patients participating in exploration of their own experiences as well as giving and receiving support and challenge from other group members.   Therapeutic Goals: 1. Patient will identify personal and current obstacles as they relate to admission. 2. Patient will identify barriers that currently interfere with their wellness or overcoming obstacles.  3. Patient will identify feelings, thought process and behaviors related to these barriers. 4. Patient will identify two changes they are willing to make to overcome these obstacles:      Summary of Patient Progress Pt shares his obstacles of un-stable housing. He shares that this is a challenge due to his ongoing mental health challenges and that it takes so long to get help with housing.  He also has a little dog that he loves dearly and a girlfriend that he identifies as supports and that they have plans to overcome these obstacles.  His plan is eventually to have his own place where he and [name] can live and to open his own "[employer]  He gets emotional in talking about it.     Therapeutic Modalities:   Cognitive Behavioral Therapy Solution Focused Therapy Motivational Interviewing Relapse Prevention Therapy  August Saucer, LCSW 07/10/2017 11:14 AM
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
## 2017-07-10 NOTE — Plan of Care (Signed)
Working on  Radiographer, therapeutic Limited participation with unit programing . Verbalization of feeling more . Educational  information given in concrete form . No anger outburst . Maintaining self control . Voice of no sleeping concerns  Progressing Coping: Ability to cope will improve 07/10/2017 1753 - Progressing by Leodis Liverpool, RN Ability to verbalize feelings will improve 07/10/2017 1753 - Progressing by Leodis Liverpool, RN Education: Knowledge of [place] Education information/materials will improve 07/10/2017 1753 - Progressing by Leodis Liverpool, RN Emotional status will improve 07/10/2017 1753 - Progressing by Leodis Liverpool, RN Safety: Ability to demonstrate self-control will improve 07/10/2017 1753 - Progressing by Leodis Liverpool, RN Ability to redirect hostility and anger into socially appropriate behaviors will improve 07/10/2017 1753 - Progressing by Leodis Liverpool, RN Activity: Sleeping patterns will improve 07/10/2017 1753 - Progressing by Leodis Liverpool, RN

## 2017-07-10 NOTE — Progress Notes (Signed)
D:Working on  coping skills Limited participation with unit programing . Verbalization of feeling more . Educational  information given in concrete form . No anger outburst . Maintaining self control . Voice of no sleeping concerns  Patient stated slept good last night .Stated appetite is good and energy level  Is normal. Stated concentration is good . Denies suicidal  homicidal ideations  .  No auditory hallucinations  No pain concerns . Appropriate ADL'S. Interacting with peers and staff.  A: Encourage patient participation with unit programming . Instruction  Given on  Medication , verbalize understanding. R: Voice no other concerns. Staff continue to monitor

## 2017-07-10 NOTE — BHH Group Notes (Signed)
[place] Group Notes:  (Nursing/MHT/Case Management/Adjunct)  Date:  07/10/2017  Time:  11:05 PM  Type of Therapy:  Group Therapy  Participation Level:  Did Not Attend    Nehemiah Settle 07/10/2017, 11:05 PM

## 2017-07-10 NOTE — BHH Group Notes (Signed)
[place] Group Notes:  (Nursing/MHT/Case Management/Adjunct)  Date:  07/10/2017  Time:  4:12 PM  Type of Therapy:  Psychoeducational Skills  Participation Level:  Active  Participation Quality:  Intrusive  Affect:  Excited  Cognitive:  Disorganized  Insight:  Limited  Engagement in Group:  Limited  Modes of Intervention:  Discussion and Education  Summary of Progress/Problems:  Christopher Mason 07/10/2017, 4:12 PM

## 2017-07-10 NOTE — BHH Group Notes (Signed)
07/10/2017 1PM  Type of Therapy/Topic:  Group Therapy:  Feelings about Diagnosis  Participation Level:  Active   Description of Group:   This group will allow patients to explore their thoughts and feelings about diagnoses they have received. Patients will be guided to explore their level of understanding and acceptance of these diagnoses. Facilitator will encourage patients to process their thoughts and feelings about the reactions of others to their diagnosis and will guide patients in identifying ways to discuss their diagnosis with significant others in their lives. This group will be process-oriented, with patients participating in exploration of their own experiences, giving and receiving support, and processing challenge from other group members.   Therapeutic Goals: 1. Patient will demonstrate understanding of diagnosis as evidenced by identifying two or more symptoms of the disorder 2. Patient will be able to express two feelings regarding the diagnosis 3. Patient will demonstrate their ability to communicate their needs through discussion and/or role play  Summary of Patient Progress: Actively and appropriately engaged in the group. Patient was able to provide support and validation to other group members.Patient practiced active listening when interacting with the facilitator and other group members Patient in still in the process of obtaining treatment goals. Christopher Mason says that before he came in he felt depressed and unhappy. He says "when I leave I want to build a family with [name] and [name]. I will take my medications and work with my ACTT team."      Therapeutic Modalities:   Cognitive Behavioral Therapy Brief Therapy Feelings Identification    Darin Engels, LCSW 07/10/2017 1:57 PM

## 2017-07-10 NOTE — BHH Group Notes (Signed)
  07/10/2017  Time: 0900  Type of Therapy and Topic: Group Therapy: Goals Group: SMART Goals   Participation Level:  Did Not Attend   Description of Group:   The purpose of a daily goals group is to assist and guide patients in setting recovery/wellness-related goals. The objective is to set goals as they relate to the crisis in which they were admitted. Patients will be using SMART goal modalities to set measurable goals. Characteristics of realistic goals will be discussed and patients will be assisted in setting and processing how one will reach their goal. Facilitator will also assist patients in applying interventions and coping skills learned in psycho-education groups to the SMART goal and process how one will achieve defined goal.   Therapeutic Goals:  -Patients will develop and document one goal related to or their crisis in which brought them into treatment.  -Patients will be guided by LCSW using SMART goal setting modality in how to set a measurable, attainable, realistic and time sensitive goal.  -Patients will process barriers in reaching goal.  -Patients will process interventions in how to overcome and successful in reaching goal.   Patient's Goal: Pt was invited to attend group but chose not to attend. CSW will continue to encourage pt to attend group throughout their admission.    Therapeutic Modalities:  Motivational Interviewing  Cognitive Behavioral Therapy  Crisis Intervention Model  SMART goals setting  Alden Hipp, MSW, LCSW 07/10/2017 9:28 AM

## 2017-07-11 MED ORDER — QUETIAPINE FUMARATE 100 MG PO TABS
100.0000 mg | ORAL_TABLET | Freq: Four times a day (QID) | ORAL | Status: DC | PRN
  Filled 2017-07-11: qty 1

## 2017-07-11 MED ORDER — PALIPERIDONE PALMITATE 156 MG/ML IM SUSP
156.0000 mg | Freq: Once | INTRAMUSCULAR | Status: AC
  Administered 2017-07-16: 156 mg via INTRAMUSCULAR
  Filled 2017-07-11 (×2): qty 1

## 2017-07-11 MED ORDER — AMITRIPTYLINE HCL 50 MG PO TABS
50.0000 mg | ORAL_TABLET | Freq: Every day | ORAL | Status: DC
  Administered 2017-07-11 – 2017-07-22 (×12): 50 mg via ORAL
  Filled 2017-07-11 (×9): qty 1
  Filled 2017-07-11: qty 2
  Filled 2017-07-11 (×2): qty 1

## 2017-07-11 NOTE — Progress Notes (Signed)
D:Continue  working  on  Radiographer, therapeutic Limited participation with unit programing .   Verbalization of feeling more . Educational  information given in concrete form . No anger outburst . Maintaining self control . Voice of no sleeping concerns  Mental and emotional status improved . Voice of being excited  About the possibility of placement. Patient spending short periods of time in unit programing . Appetite good  Voice no concerns around  Sleep except feeling  Really sleepy in am when  He gets up . Voice of girl friend visting this shift   No pain concerns . Appropriate ADL'S. Interacting with peers and staff.   A: Encourage patient participation with unit programming . Instruction  Given on  Medication , verbalize understanding.  R: Voice no other concerns. Staff continue to monitor
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
## 2017-07-11 NOTE — BHH Counselor (Signed)
CSW spoke with Thayer Headings at [employer]. She is coming to speak with the patient in person on Friday 07/13/2017 between 11-1pm.   Darin Engels, MSW, [place], [place] 07/11/2017 12:35 PM

## 2017-07-11 NOTE — Plan of Care (Addendum)
Patient slept for Estimated Hours of 7.0; Precautionary checks every 15 minutes for safety maintained, room free of safety hazards, patient sustains no injury or falls during this shift.

## 2017-07-11 NOTE — Tx Team (Signed)
Interdisciplinary Treatment and Diagnostic Plan Update  07/11/2017 Time of Session:11AM Christopher Mason MRN: [id number]  Principal Diagnosis: Undifferentiated schizophrenia [facility])  Secondary Diagnoses: Principal Problem:   Undifferentiated schizophrenia ([place]) Active Problems:   Tardive dyskinesia   Tobacco use disorder   Current Medications:  Current Facility-Administered Medications  Medication Dose Route Frequency Provider Last Rate Last Dose  . acetaminophen (TYLENOL) tablet 650 mg  650 mg Oral Q6H PRN Clapacs, John T, MD      . albuterol (PROVENTIL HFA;VENTOLIN HFA) 108 (90 Base) MCG/ACT inhaler 2 puff  2 puff Inhalation Q6H PRN Clapacs, John T, MD      . alum & mag hydroxide-simeth (MAALOX/MYLANTA) 200-200-20 MG/5ML suspension 30 mL  30 mL Oral Q4H PRN Clapacs, John T, MD      . amitriptyline (ELAVIL) tablet 50 mg  50 mg Oral QHS McNew, Holly R, MD      . cloNIDine (CATAPRES) tablet 0.1 mg  0.1 mg Oral BID PRN McNew, Holly R, MD      . docusate sodium (COLACE) capsule 100 mg  100 mg Oral BID Clapacs, Madie Reno, MD   100 mg at 07/11/17 0818  . levothyroxine (SYNTHROID, LEVOTHROID) tablet 125 mcg  125 mcg Oral Q0600 Marylin Crosby, MD   125 mcg at 07/11/17 0608  . lisinopril (PRINIVIL,ZESTRIL) tablet 40 mg  40 mg Oral Daily McNew, Tyson Babinski, MD   40 mg at 07/11/17 0815  . magnesium hydroxide (MILK OF MAGNESIA) suspension 30 mL  30 mL Oral Daily PRN Clapacs, John T, MD      . meloxicam (MOBIC) tablet 15 mg  15 mg Oral Daily Clapacs, Madie Reno, MD   15 mg at 07/11/17 0814  . [START ON 07/16/2017] paliperidone (INVEGA SUSTENNA) injection 156 mg  156 mg Intramuscular Once McNew, Holly R, MD      . pantoprazole (PROTONIX) EC tablet 40 mg  40 mg Oral Daily Clapacs, Madie Reno, MD   40 mg at 07/11/17 0814  . propranolol (INDERAL) tablet 10 mg  10 mg Oral TID Lenward Chancellor, MD   10 mg at 07/11/17 0813  . QUEtiapine (SEROQUEL) tablet 100 mg  100 mg Oral Q6H PRN McNew, Holly R, MD      .  venlafaxine XR (EFFEXOR-XR) 24 hr capsule 75 mg  75 mg Oral Q breakfast McNew, Tyson Babinski, MD   75 mg at 07/11/17 4132   PTA Medications: Medications Prior to Admission  Medication Sig Dispense Refill Last Dose  . albuterol (PROVENTIL HFA;VENTOLIN HFA) 108 (90 BASE) MCG/ACT inhaler Inhale 2 puffs into the lungs every 4 (four) hours as needed for wheezing or shortness of breath. 1 each 0 Taking  . amitriptyline (ELAVIL) 50 MG tablet Take 100 mg by mouth at bedtime.   Taking  . amLODipine (NORVASC) 10 MG tablet Take 1 tablet (10 mg total) by mouth daily. 30 tablet 2 Taking  . amoxicillin-clavulanate (AUGMENTIN) 875-125 MG tablet Take 1 tablet by mouth 2 (two) times daily. (Patient not taking: Reported on 07/03/2017) 20 tablet 0 Not Taking at --  . benzonatate (TESSALON PERLES) 100 MG capsule Take 1 capsule (100 mg total) by mouth every 6 (six) hours as needed for cough. (Patient not taking: Reported on 05/16/2017) 15 capsule 0 Not Taking at --  . diclofenac sodium (VOLTAREN) 1 % GEL Apply 2 g topically 3 (three) times daily. (Patient not taking: Reported on 05/16/2017) 100 g 0 Not Taking  . docusate sodium (COLACE) 100 MG capsule Take  1 capsule (100 mg total) by mouth daily. 30 capsule 3   . doxycycline (VIBRAMYCIN) 100 MG capsule Take 1 capsule (100 mg total) by mouth 2 (two) times daily. (Patient not taking: Reported on 05/16/2017) 28 capsule 0 Not Taking at --  . ibuprofen (ADVIL,MOTRIN) 800 MG tablet Take 1 tablet (800 mg total) by mouth every 8 (eight) hours as needed. (Patient not taking: Reported on 05/16/2017) 15 tablet 0 Not Taking at --  . loperamide (IMODIUM A-D) 2 MG tablet Take 1 tablet (2 mg total) by mouth 4 (four) times daily as needed for diarrhea or loose stools. (Patient not taking: Reported on 06/07/2017) 12 tablet 0 Not Taking at --  . meloxicam (MOBIC) 15 MG tablet Take 1 tablet (15 mg total) by mouth daily. 30 tablet 0 Taking  . methocarbamol (ROBAXIN) 500 MG tablet Take 1 tablet (500 mg  total) by mouth 4 (four) times daily. 16 tablet 0 Taking  . ondansetron (ZOFRAN) 4 MG tablet Take 1 tablet (4 mg total) by mouth daily as needed for nausea or vomiting. 20 tablet 1 PRN at PRN  . pravastatin (PRAVACHOL) 10 MG tablet Take 1 tablet (10 mg total) by mouth daily. 30 tablet 2 Taking  . QUEtiapine (SEROQUEL) 25 MG tablet Take 25 mg by mouth 3 (three) times daily.   Taking  . QUEtiapine (SEROQUEL) 300 MG tablet Take 1 tablet (300 mg total) by mouth at bedtime. 30 tablet 0 Taking  . solifenacin (VESICARE) 10 MG tablet Take 1 tab by mouth nightly for overactive bladder 30 tablet 2 Taking  . sucralfate (CARAFATE) 1 g tablet Take 1 tablet (1 g total) by mouth 3 (three) times daily. 30 tablet 5 Taking  . tamsulosin (FLOMAX) 0.4 MG CAPS capsule Take by mouth.   Taking  . thyroid (ARMOUR) 60 MG tablet Take 120 mg by mouth daily before breakfast.   Taking  . venlafaxine (EFFEXOR) 50 MG tablet Take 50 mg by mouth 2 (two) times daily.   Taking    Patient Stressors: Educational concerns Financial difficulties Health problems Legal issue Medication change or noncompliance Substance abuse  Patient Strengths: Ability for insight Capable of independent living Communication skills Religious Affiliation Supportive family/friends  Treatment Modalities: Medication Management, Group therapy, Case management,  1 to 1 session with clinician, Psychoeducation, Recreational therapy.   Physician Treatment Plan for Primary Diagnosis: Undifferentiated schizophrenia ([place]) Long Term Goal(s): Improvement in symptoms so as ready for discharge   Short Term Goals: Ability to demonstrate self-control will improve  Medication Management: Evaluate patient's response, side effects, and tolerance of medication regimen.  Therapeutic Interventions: 1 to 1 sessions, Unit Group sessions and Medication administration.  Evaluation of Outcomes: Progressing  Physician Treatment Plan for Secondary Diagnosis:  Principal Problem:   Undifferentiated schizophrenia ([place]) Active Problems:   Tardive dyskinesia   Tobacco use disorder  Long Term Goal(s): Improvement in symptoms so as ready for discharge   Short Term Goals: Ability to demonstrate self-control will improve     Medication Management: Evaluate patient's response, side effects, and tolerance of medication regimen.  Therapeutic Interventions: 1 to 1 sessions, Unit Group sessions and Medication administration.  Evaluation of Outcomes: Progressing   RN Treatment Plan for Primary Diagnosis: Undifferentiated schizophrenia ([place]) Long Term Goal(s): Knowledge of disease and therapeutic regimen to maintain health will improve  Short Term Goals: Ability to verbalize frustration and anger appropriately will improve, Ability to demonstrate self-control and Ability to identify and develop effective coping behaviors will improve  Medication  Management: RN will administer medications as ordered by provider, will assess and evaluate patient's response and provide education to patient for prescribed medication. RN will report any adverse and/or side effects to prescribing provider.  Therapeutic Interventions: 1 on 1 counseling sessions, Psychoeducation, Medication administration, Evaluate responses to treatment, Monitor vital signs and CBGs as ordered, Perform/monitor CIWA, COWS, AIMS and Fall Risk screenings as ordered, Perform wound care treatments as ordered.  Evaluation of Outcomes: Progressing   LCSW Treatment Plan for Primary Diagnosis: Undifferentiated schizophrenia ([place]) Long Term Goal(s): Safe transition to appropriate next level of care at discharge, Engage patient in therapeutic group addressing interpersonal concerns.  Short Term Goals: Engage patient in aftercare planning with referrals and resources, Increase social support, Increase ability to appropriately verbalize feelings, Increase emotional regulation, Identify triggers associated  with mental health/substance abuse issues and Increase skills for wellness and recovery  Therapeutic Interventions: Assess for all discharge needs, 1 to 1 time with Social worker, Explore available resources and support systems, Assess for adequacy in community support network, Educate family and significant other(s) on suicide prevention, Complete Psychosocial Assessment, Interpersonal group therapy.  Evaluation of Outcomes: Progressing   Progress in Treatment: Attending groups: Yes. Participating in groups: Yes. Taking medication as prescribed: Yes. Toleration medication: Yes. Family/Significant other contact made:Yes, [name], Patients Girlfriend and  ACTT Team contacted.  Patient understands diagnosis: Yes. Discussing patient identified problems/goals with staff: Yes. Medical problems stabilized or resolved: Yes. Denies suicidal/homicidal ideation: Yes. Issues/concerns per patient self-inventory: No. Other:   New problem(s) identified: No, Describe:  None  New Short Term/Long Term Goal(s): "To get me a place to live."  Discharge Plan or Barriers: To go to a shelter if placement is not secured and continue to work with ACTT team.   Reason for Continuation of Hospitalization: Medication stabilization  Estimated Length of Stay: 5-7 days  Attendees: Patient:  07/11/2017 11:22 AM  Physician:Holly McNew MD 07/11/2017 11:22 AM  Nursing:  07/11/2017 11:22 AM  RN Care Manager: 07/11/2017 11:22 AM  Social Worker: Darin Engels, [place] 07/11/2017 11:22 AM  Recreational Therapist:  07/11/2017 11:22 AM  Other:  07/11/2017 11:22 AM  Other: Alden Hipp, LCSW 07/11/2017 11:22 AM  Other: 07/11/2017 11:22 AM    Scribe for Treatment Team: Darin Engels, LCSW 07/11/2017 11:22 AM

## 2017-07-11 NOTE — Progress Notes (Signed)
Patient ID: Christopher Mason, male   DOB: Jul 15, 1960, 57 y.o.   MRN: [id number] Visible in the milieu, proudly announced "my fiance is coming to visit me tomorrow..." Speech still fragmented, walk staggered, animated, almost as euphoric, denied pain, denied SI/HI/AVH. Surprised that he has no medications scheduled tonight; Restoril 15 mg Capsule given at 2121 "I don't think this will make me sleep..." Patient came back c/o difficulty sleeping and 50 mg Seroquel given at 2246; redirected back to bed.

## 2017-07-11 NOTE — Plan of Care (Signed)
  Continue  working  on  Radiographer, therapeutic Limited participation with unit programing .   Verbalization of feeling more . Educational  information given in concrete form . No anger outburst . Maintaining self control . Voice of no sleeping concerns  Mental and emotional status improved    Progressing Coping: Ability to cope will improve 07/11/2017 1155 - Progressing by Leodis Liverpool, RN Ability to verbalize feelings will improve 07/11/2017 1155 - Progressing by Leodis Liverpool, RN Education: Knowledge of [place] Education information/materials will improve 07/11/2017 1155 - Progressing by Leodis Liverpool, RN Emotional status will improve 07/11/2017 1155 - Progressing by Leodis Liverpool, RN Safety: Ability to demonstrate self-control will improve 07/11/2017 1155 - Progressing by Leodis Liverpool, RN Ability to redirect hostility and anger into socially appropriate behaviors will improve 07/11/2017 1155 - Progressing by Leodis Liverpool, RN Activity: Sleeping patterns will improve 07/11/2017 1155 - Progressing by Leodis Liverpool, RN
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
## 2017-07-11 NOTE — BHH Group Notes (Signed)
  07/11/2017  Time: 1PM  Type of Therapy/Topic:  Group Therapy:  Emotion Regulation  Participation Level:  Active   Description of Group:    The purpose of this group is to assist patients in learning to regulate negative emotions and experience positive emotions. Patients will be guided to discuss ways in which they have been vulnerable to their negative emotions. These vulnerabilities will be juxtaposed with experiences of positive emotions or situations, and patients will be challenged to use positive emotions to combat negative ones. Special emphasis will be placed on coping with negative emotions in conflict situations, and patients will process healthy conflict resolution skills.  Therapeutic Goals: 1. Patient will identify two positive emotions or experiences to reflect on in order to balance out negative emotions 2. Patient will label two or more emotions that they find the most difficult to experience 3. Patient will demonstrate positive conflict resolution skills through discussion and/or role plays  Summary of Patient Progress: Pt continues to work towards their tx goals but has not yet reached them. Pt was able to appropriately participate in group discussion, and was able to offer support/validation to other group members. Pt reported he is feeling, "sleepy because of this stupid Seroqel. I'm also down because I can't see my girl or my dog." Pt reported one emotion he has a difficult time expressing appropriately is his depression. Pt reported one way he can better regulate his emotions is to, "listen to rock music."     Therapeutic Modalities:   Cognitive Behavioral Therapy Feelings Identification Dialectical Behavioral Therapy   Alden Hipp, MSW, LCSW 07/11/2017 1:54 PM

## 2017-07-11 NOTE — BHH Counselor (Signed)
CSW spoke with [name] [phone]), the patients girlfriend. She reports that she has not found a place to live as he reported. She says that his ACTT team is working on that. She says that he cannot come and live with her because he is not allowed on the apartment property because she lives where he was living. She also reports that the patient doesn't have any other family or friends that he can go and live with. No other questions or concerns reported.    Darin Engels, MSW, [place], LCASA 07/11/2017 10:21 AM

## 2017-07-11 NOTE — BHH Counselor (Signed)
CSW sent a referral to [facility] for the patient.    Darin Engels, MSW, [place], LCASA 07/11/2017 10:08 AM

## 2017-07-11 NOTE — Plan of Care (Signed)
Continue  working  on  Radiographer, therapeutic Limited participation with unit programing .   Verbalization of feeling more . Educational  information given in concrete form . No anger outburst . Maintaining self control . Voice of no sleeping concerns  Mental and emotional status improved .  Progressing Coping: Ability to cope will improve 07/11/2017 1504 - Progressing by Leodis Liverpool, RN 07/11/2017 1155 - Progressing by Leodis Liverpool, RN Ability to verbalize feelings will improve 07/11/2017 1504 - Progressing by Leodis Liverpool, RN 07/11/2017 1155 - Progressing by Leodis Liverpool, RN Education: Knowledge of [place] Education information/materials will improve 07/11/2017 1504 - Progressing by Leodis Liverpool, RN 07/11/2017 1155 - Progressing by Leodis Liverpool, RN Emotional status will improve 07/11/2017 1504 - Progressing by Leodis Liverpool, RN 07/11/2017 1155 - Progressing by Leodis Liverpool, RN Safety: Ability to demonstrate self-control will improve 07/11/2017 1504 - Progressing by Leodis Liverpool, RN 07/11/2017 1155 - Progressing by Leodis Liverpool, RN Ability to redirect hostility and anger into socially appropriate behaviors will improve 07/11/2017 1504 - Progressing by Leodis Liverpool, RN 07/11/2017 1155 - Progressing by Leodis Liverpool, RN Activity: Sleeping patterns will improve 07/11/2017 1504 - Progressing by Leodis Liverpool, RN 07/11/2017 1155 - Progressing by Leodis Liverpool, RN

## 2017-07-12 LAB — BASIC METABOLIC PANEL
ANION GAP: 6 (ref 5–15)
BUN: 21 mg/dL — AB (ref 6–20)
CALCIUM: 9.4 mg/dL (ref 8.9–10.3)
CO2: 28 mmol/L (ref 22–32)
Chloride: 104 mmol/L (ref 101–111)
Creatinine, Ser: 0.99 mg/dL (ref 0.61–1.24)
GFR calc Af Amer: >60 mL/min (ref >60)
GLUCOSE: 100 mg/dL — AB (ref 65–99)
POTASSIUM: 4.3 mmol/L (ref 3.5–5.1)
SODIUM: 138 mmol/L (ref 135–145)

## 2017-07-12 LAB — TSH: TSH: 127 u[IU]/mL — ABNORMAL HIGH (ref 0.350–4.500)

## 2017-07-12 LAB — T4, FREE: FREE T4: 0.57 ng/dL — AB (ref 0.61–1.12)

## 2017-07-12 MED ORDER — LORAZEPAM 2 MG PO TABS
2.0000 mg | ORAL_TABLET | ORAL | Status: DC | PRN
  Administered 2017-07-12 – 2017-07-16 (×2): 2 mg via ORAL
  Filled 2017-07-12 (×2): qty 1

## 2017-07-12 MED ORDER — ALBUTEROL SULFATE HFA 108 (90 BASE) MCG/ACT IN AERS
2.0000 | INHALATION_SPRAY | RESPIRATORY_TRACT | Status: DC | PRN
  Administered 2017-07-12 (×2): 2 via RESPIRATORY_TRACT
  Filled 2017-07-12: qty 6.7

## 2017-07-12 MED ORDER — TEMAZEPAM 15 MG PO CAPS
15.0000 mg | ORAL_CAPSULE | Freq: Every evening | ORAL | Status: DC | PRN
  Administered 2017-07-12 – 2017-07-19 (×7): 15 mg via ORAL
  Filled 2017-07-12 (×7): qty 1

## 2017-07-12 NOTE — Progress Notes (Signed)
Pt stated he could not sleep in the room with his roommate, pt was irritable and stated he was having an issue with the roommate waking up and yelling at him. Pt stated he wanted to leave tonight. Pt said he was not going to sleep in the room with the guy in the room.

## 2017-07-12 NOTE — Progress Notes (Signed)
"  I miss Tiny and I lost my apartment"
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
## 2017-07-12 NOTE — Progress Notes (Signed)
Due to pt irritability and labile nature and inability to tolerate his roommate at this time pt was given PRN 2mg  Ativan and pt was allowed to sleep in the quiet room .

## 2017-07-12 NOTE — Progress Notes (Signed)
D: Pt denies SI/HI/AVH. Pt is pleasant and cooperative. Pt stated he has been approved for his housing and is happy due to waiting for this for some months. Pt stated he felt better since coming in.   A: Pt was offered support and encouragement. Pt was given scheduled medications. Pt was encourage to attend groups. Q 15 minute checks were done for safety.   R:Pt attends groups and interacts well with peers and staff. Pt is taking medication. Pt has no complaints.Pt receptive to treatment and safety maintained on unit.

## 2017-07-12 NOTE — Progress Notes (Signed)
Pt continues to be focused on being IVC and wanting to leave, pt can not understand the concept of him being IVC'd avfer coming in to the hospital on his own.

## 2017-07-12 NOTE — B[facility] Group Notes (Signed)
LCSW Group Therapy Note  07/12/2017 1:15pm  Type of Therapy/Topic:  Group Therapy:  Balance in Life  Participation Level:  Active  Description of Group:    This group will address the concept of balance and how it feels and looks when one is unbalanced. Patients will be encouraged to process areas in their lives that are out of balance and identify reasons for remaining unbalanced. Facilitators will guide patients in utilizing problem-solving interventions to address and correct the stressor making their life unbalanced. Understanding and applying boundaries will be explored and addressed for obtaining and maintaining a balanced life. Patients will be encouraged to explore ways to assertively make their unbalanced needs known to significant others in their lives, using other group members and facilitator for support and feedback.  Therapeutic Goals: 1. Patient will identify two or more emotions or situations they have that consume much of in their lives. 2. Patient will identify signs/triggers that life has become out of balance:  3. Patient will identify two ways to set boundaries in order to achieve balance in their lives:  4. Patient will demonstrate ability to communicate their needs through discussion and/or role plays  Summary of Patient Progress: Christopher Mason was able to actively participate in today's group although he had to be re-directed to today's topic several times.  Christopher Mason was able to identify two emotions that have consume much of his life and that he has felt when he was in balance to include happy and excited.  Christopher Mason also shared that triggers in his life that had led to him being out of balance have include having poor mental and physical health as well as lost of his housing and job.  Christopher Mason was able to share that a boundary that he has set that helped him to stay balanced for two years was to not use drugs and to try to be around positive people.  Christopher Mason was able to demonstrate his  ability to community his needs through discussion.     Therapeutic Modalities:   Cognitive Behavioral Therapy Solution-Focused Therapy Assertiveness Training  Devona Konig, [place] 07/12/2017 3:28 PM

## 2017-07-12 NOTE — Progress Notes (Signed)
Pt tearful and stated  He was scared " I don't want to go to no institution" . Pt presents very labile on the unit. Pt is irritable  Then becomes remorseful and tearful.

## 2017-07-12 NOTE — [facility] Group Notes (Signed)
  07/12/2017 9am  Type of Therapy and Topic: Group Therapy: Goals Group: SMART Goals   Participation Level: Active  Description of Group:    The purpose of a daily goals group is to assist and guide patients in setting recovery/wellness-related goals. The objective is to set goals as they relate to the crisis in which they were admitted. Patients will be using SMART goal modalities to set measurable goals. Characteristics of realistic goals will be discussed and patients will be assisted in setting and processing how one will reach their goal. Facilitator will also assist patients in applying interventions and coping skills learned in psycho-education groups to the SMART goal and process how one will achieve defined goal.   Therapeutic Goals:   -Patients will develop and document one goal related to or their crisis in which brought them into treatment.  -Patients will be guided by LCSW using SMART goal setting modality in how to set a measurable, attainable, realistic and time sensitive goal.  -Patients will process barriers in reaching goal.  -Patients will process interventions in how to overcome and successful in reaching goal.   Patient's Goal: "To take my medications today and attend group so that I can be discharged."  Therapeutic Modalities:  Motivational Interviewing  Cognitive Behavioral Therapy  Crisis Intervention Model  SMART goals setting  Darin Engels, [place] 07/12/2017 9:37 AM

## 2017-07-12 NOTE — Progress Notes (Signed)
Received Christopher Mason this morning after breakfast, he was compliant with his medications. He denied all of the psychiatric symptoms this AM. He is OOB in the milieu at intervals. No change in his status this PM.
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
## 2017-07-12 NOTE — B[facility] Group Notes (Signed)
[place] Group Notes:  (Nursing/MHT/Case Management/Adjunct)  Date:  07/12/2017  Time:  10:43 PM  Type of Therapy:  Group Therapy  Participation Level:  Active  Participation Quality:  Sharing  Affect:  Excited  Cognitive:  Left Group   I  Summary of Progress/Problems:  Christopher Mason 07/12/2017, 10:43 PM

## 2017-07-12 NOTE — Plan of Care (Signed)
  Problem: Coping: Goal: Ability to cope will improve Outcome: Progressing Note:  Pt got irritated with another pt, but came back later to apologize

## 2017-07-13 MED ORDER — IPRATROPIUM-ALBUTEROL 0.5-2.5 (3) MG/3ML IN SOLN
3.0000 mL | Freq: Every day | RESPIRATORY_TRACT | Status: DC
  Administered 2017-07-14 – 2017-07-15 (×2): 3 mL via RESPIRATORY_TRACT
  Filled 2017-07-13: qty 3

## 2017-07-13 MED ORDER — ALBUTEROL SULFATE HFA 108 (90 BASE) MCG/ACT IN AERS
2.0000 | INHALATION_SPRAY | Freq: Two times a day (BID) | RESPIRATORY_TRACT | Status: DC | PRN
  Administered 2017-07-13 – 2017-07-23 (×15): 2 via RESPIRATORY_TRACT
  Filled 2017-07-13 (×2): qty 6.7

## 2017-07-13 MED ORDER — IPRATROPIUM-ALBUTEROL 0.5-2.5 (3) MG/3ML IN SOLN: 3.0000 mL | RESPIRATORY_TRACT | Status: DC

## 2017-07-13 NOTE — Plan of Care (Signed)
Pt. Verbalizes understanding of provided education. Pt. Reports doing, "better" this evening. Pt. Reports he can remain safe while on the unit and can contract for safety verbally. Pt. Behavior appropriate and engaging with this Probation officer.   Problem: Coping: Goal: Ability to verbalize feelings will improve Outcome: Progressing   Problem: Education: Goal: Knowledge of [facility] Education information/materials will improve Outcome: Progressing   Problem: Safety: Goal: Ability to demonstrate self-control will improve Outcome: Progressing

## 2017-07-13 NOTE — [facility] Group Notes (Signed)
LCSW Group Therapy Note  07/13/2017 9:30 AM  Type of Therapy and Topic:  Group Therapy:  Feelings around Relapse and Recovery  Participation Level:  Active   Description of Group:    Patients in this group will discuss emotions they experience before and after a relapse. They will process how experiencing these feelings, or avoidance of experiencing them, relates to having a relapse. Facilitator will guide patients to explore emotions they have related to recovery. Patients will be encouraged to process which emotions are more powerful. They will be guided to discuss the emotional reaction significant others in their lives may have to their relapse or recovery. Patients will be assisted in exploring ways to respond to the emotions of others without this contributing to a relapse.  Therapeutic Goals: 1. Patient will identify two or more emotions that lead to a relapse for them 2. Patient will identify two emotions that result when they relapse 3. Patient will identify two emotions related to recovery 4. Patient will demonstrate ability to communicate their needs through discussion and/or role plays   Summary of Patient Progress: Trai was able to identify as least two emotions that have lead to and resulted in past relapses for him to include feeling depressed and "cranky".  Azim was able to identify at least two emotions that he has experienced related to times of recovery to include happy and content.  Vanden shared that triggers for relapse in the past have included conflicts with others in his life.  Anthonymichael has been able to demonstrate ability to communicate his needs through discussion both in group and on the unit with staff.    Therapeutic Modalities:   Cognitive Behavioral Therapy Solution-Focused Therapy Assertiveness Training Relapse Prevention Therapy   Devona Konig, LCSW 07/13/2017 2:14 PM

## 2017-07-13 NOTE — Progress Notes (Signed)
Received Christopher Mason this AM after breakfast, he was compliant with his medications. He denied all of the psychiatric symptoms. He is OOB in the milieu at at intervals and attending the group therapy sessions. His status is unchanged this PM.
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
## 2017-07-13 NOTE — Progress Notes (Signed)
D:Pt denies SI/HI/AVH. Pt is pleasant and cooperative during assessment. Pt. has no Complaints.  Patient interaction is engaging. Pt. Verbalizes understanding of provided education. Pt. Reports doing, "better" this evening. Pt. Reports he can remain safe while on the unit and can contract for safety verbally. Reports sleeping "so-so". Pt. Reports show eating good.   A: Q x 15 minute observation checks were completed for safety. Patient was provided with education. Patient was given scheduled/PRN medications. Patient  was encourage to attend groups, participate in unit activities and continue with plan of care.   R:Patient is complaint with medication and unit procedures. Does attends groups.             Precautionary checks every 15 minutes for safety maintained, room free of safety hazards, patient sustains no injury or falls during this shift.

## 2017-07-14 NOTE — Plan of Care (Signed)
Pt. Reports doing, "really good" this evening, because he stated his girlfriend came to visit him today. Pt. Verbalizes understanding of provided education. Pt. Sleeping good per sleep reports. Pt. Contracts for safety and denies SI/HI. Pt. Reports he can remain safe while on the unit.    Problem: Coping: Goal: Ability to verbalize feelings will improve Outcome: Progressing   Problem: Education: Goal: Knowledge of [place] General Education information/materials will improve Outcome: Progressing Goal: Emotional status will improve Outcome: Progressing   Problem: Activity: Goal: Sleeping patterns will improve Outcome: Progressing

## 2017-07-14 NOTE — Progress Notes (Signed)
D:Pt denies SI/HI/AVH. Pt is pleasant and cooperative during assessment. Pt.has no Complaints.Patient interaction is engaging. Pt. Verbalizes understanding of provided education. Pt. Reports doing, "really good" this evening, because his girlfriend visited him today. Pt. Reports being Eager to discharge to be with family. Pt. Reports he can remain safe while on the unit and can contract for safety verbally. Reports sleeping "good". Pt. Reports show eating good.   A: Q x 15 minute observation checks were completed for safety. Patient was provided with education. Patient was given scheduled/PRN medications. Patient was encourage to attend groups, participate in unit activities and continue with plan of care.   R:Patient is complaint with medication and unit procedures. Does not attend groups.             Precautionary checks every 15 minutes for safety maintained, room free of safety hazards, patient sustains no injury or falls during this shift.
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
## 2017-07-14 NOTE — Progress Notes (Signed)
Received Christopher Mason this am after breakfast, he was compliant with his medications. He denied all of the psychiatric symptoms including suicidal ideation. He was given clonidine for an elevated B/P 142/100 -  70.He is looking forward to a visit from his girlfriend tonight.

## 2017-07-14 NOTE — [facility] Group Notes (Signed)
LCSW Group Therapy Note  07/14/2017 1:15pm  Type of Therapy and Topic:  Group Therapy:  Healthy Self Image and Positive Change  Participation Level:  Did Not Attend   Description of Group:  In this group, patients will compare and contrast their current "I am...." statements to the visions they identify as desirable for their lives.  Patients discuss fears and how they can make positive changes in their cognitions that will positively impact their behaviors.  Facilitator played a motivational 3-minute speech and patients were left with the task of thinking about what "I am...." statements they can start using in their lives immediately.  Therapeutic Goals: 1. Patient will state their current self-perception as expressed in an "I Am" statement 2. Patient will contrast this with their desired vision for their live 3. Patient will identify 3 fears that negatively impact their behavior 4. Patient will discuss cognitive distortions that stem from their fears 5. Patient will verbalize statements that challenge their cognitive distortions  Summary of Patient Progress:  Patient was invited to group and did not attend.     Therapeutic Modalities Cognitive Behavioral Therapy Motivational Interviewing  Delford Wingert  CUEBAS-COLON, LCSW 07/14/2017 12:21 PM

## 2017-07-14 NOTE — [facility] Group Notes (Signed)
[place] Group Notes:  (Nursing/MHT/Case Management/Adjunct)  Date:  07/14/2017  Time:  2:44 PM  Type of Therapy:  Psychoeducational Skills  Participation Level:  Did Not Attend  Summary of Progress/Problems:  Kathi Ludwig 07/14/2017, 2:44 PM

## 2017-07-15 NOTE — [facility] Group Notes (Signed)
LCSW Group Therapy Note 07/15/2017 1:15pm  Type of Therapy and Topic: Group Therapy: Feelings Around Returning Home & Establishing a Supportive Framework and Supporting Oneself When Supports Not Available  Participation Level: Active  Description of Group:  Patients first processed thoughts and feelings about upcoming discharge. These included fears of upcoming changes, lack of change, new living environments, judgements and expectations from others and overall stigma of mental health issues. The group then discussed the definition of a supportive framework, what that looks and feels like, and how do to discern it from an unhealthy non-supportive network. The group identified different types of supports as well as what to do when your family/friends are less than helpful or unavailable  Therapeutic Goals  1. Patient will identify one healthy supportive network that they can use at discharge. 2. Patient will identify one factor of a supportive framework and how to tell it from an unhealthy network. 3. Patient able to identify one coping skill to use when they do not have positive supports from others. 4. Patient will demonstrate ability to communicate their needs through discussion and/or role plays.  Summary of Patient Progress:  Pt reported he feels "alright". Pt engaged during group session. As patients processed their anxiety about discharge and described healthy supports patient shared he is ready to go back to his doggie. Pt identified some of his friends and his girlfriend as part of his support system. Patients identified at least one self-care tool they were willing to use after discharge. Pt was able to list some of his coping skills as; working on getting housing, spending time with friends, and taking his medications.   Therapeutic Modalities Cognitive Behavioral Therapy Motivational Interviewing   Cheree Ditto, LCSW 07/15/2017 12:27 PM

## 2017-07-15 NOTE — Plan of Care (Signed)
Pt. Reports he is in a good mood this evening during assessment. Pt. Verbalizes understanding of provided education. Pt. Observed attending to ADLs this evening showering and cleaning up. Pt. Observed interacting appropriately with peers and staff. Pt. Sleep reports good.    Problem: Coping: Goal: Ability to verbalize feelings will improve Outcome: Progressing   Problem: Education: Goal: Knowledge of [place] [facility] Education information/materials will improve Outcome: Progressing Goal: Emotional status will improve Outcome: Progressing   Problem: Safety: Goal: Ability to demonstrate self-control will improve Outcome: Progressing   Problem: Activity: Goal: Sleeping patterns will improve Outcome: Progressing

## 2017-07-15 NOTE — Progress Notes (Signed)
D:Pt denies SI/HI/AVH. Pt is pleasant and cooperative during engagement with staff and peers. Pt. has no Complaints, other then reported chronic pain in neck, that was addressed with PRN medications.  Patient Interactions engaging and mood is pleasant. Pt. Reports good mood this evening. Pt. Verbalizes understanding of provided education. Pt. Observed attending to ADLs this evening showering and cleaning up. Pt. Reports sleeping and eating good.   A: Q x 15 minute observation checks were completed for safety. Patient was provided with education. Patient was given scheduled/PRN medications. Patient  was encourage to attend groups, participate in unit activities and continue with plan of care.   R:Patient is complaint with medication and unit procedures. Does not attend group this evening.              Precautionary checks every 15 minutes for safety maintained, room free of safety hazards, patient sustains no injury or falls during this shift.

## 2017-07-15 NOTE — [facility] Group Notes (Signed)
[place] Group Notes:  (Nursing/MHT/Case Management/Adjunct)  Date:  07/15/2017  Time:  9:08 PM  Type of Therapy:  Psychoeducational Skills  Participation Level:  Did Not Attend   Joretta Bachelor 07/15/2017, 9:08 PM
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
## 2017-07-15 NOTE — Progress Notes (Signed)
D- Patient alert and oriented. Patient presents in a pleasant mood on assessment stating that he slept good last night and is "restless to see my dog". Patient denies SI, HI, AVH, and pain at this time. Patient also denies any signs/symptoms of depression and anxiety at this time. Patient's goal for today is "to go to all groups" and patient reports that "it's important to me to go to all groups".  A- Scheduled medications administered to patient, per MD orders. Support and encouragement provided.  Routine safety checks conducted every 15 minutes.  Patient informed to notify staff with problems or concerns.  R- No adverse drug reactions noted. Patient contracts for safety at this time. Patient compliant with medications and treatment plan. Patient receptive, calm, and cooperative. Patient interacts well with others on the unit. Patient remains safe at this time.
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
## 2017-07-16 NOTE — Tx Team (Signed)
Interdisciplinary Treatment and Diagnostic Plan Update  07/16/2017 Time of Session: [place] MRN: [id number]  Principal Diagnosis: Undifferentiated schizophrenia [facility])  Secondary Diagnoses: Principal Problem:   Undifferentiated schizophrenia ([place]) Active Problems:   Tardive dyskinesia   Tobacco use disorder   Current Medications:  Current Facility-Administered Medications  Medication Dose Route Frequency Provider Last Rate Last Dose  . acetaminophen (TYLENOL) tablet 650 mg  650 mg Oral Q6H PRN Clapacs, Madie Reno, MD   650 mg at 07/15/17 2331  . albuterol (PROVENTIL HFA;VENTOLIN HFA) 108 (90 Base) MCG/ACT inhaler 2 puff  2 puff Inhalation BID PRN Marylin Crosby, MD   2 puff at 07/16/17 0817  . alum & mag hydroxide-simeth (MAALOX/MYLANTA) 200-200-20 MG/5ML suspension 30 mL  30 mL Oral Q4H PRN Clapacs, John T, MD      . amitriptyline (ELAVIL) tablet 50 mg  50 mg Oral QHS McNew, Tyson Babinski, MD   50 mg at 07/15/17 2109  . cloNIDine (CATAPRES) tablet 0.1 mg  0.1 mg Oral BID PRN Marylin Crosby, MD   0.1 mg at 07/14/17 1829  . docusate sodium (COLACE) capsule 100 mg  100 mg Oral BID Clapacs, Madie Reno, MD   100 mg at 07/16/17 0817  . ipratropium-albuterol (DUONEB) 0.5-2.5 (3) MG/3ML nebulizer solution 3 mL  3 mL Nebulization Daily McNew, Tyson Babinski, MD   3 mL at 07/15/17 0845  . levothyroxine (SYNTHROID, LEVOTHROID) tablet 125 mcg  125 mcg Oral Q0600 Marylin Crosby, MD   125 mcg at 07/16/17 [phone]  . lisinopril (PRINIVIL,ZESTRIL) tablet 40 mg  40 mg Oral Daily McNew, Tyson Babinski, MD   40 mg at 07/16/17 0818  . LORazepam (ATIVAN) tablet 2 mg  2 mg Oral Q4H PRN Clapacs, Madie Reno, MD   2 mg at 07/12/17 2326  . magnesium hydroxide (MILK OF MAGNESIA) suspension 30 mL  30 mL Oral Daily PRN Clapacs, John T, MD      . meloxicam (MOBIC) tablet 15 mg  15 mg Oral Daily Clapacs, Madie Reno, MD   15 mg at 07/16/17 0817  . paliperidone (INVEGA SUSTENNA) injection 156 mg  156 mg Intramuscular Once McNew, Holly R, MD       . pantoprazole (PROTONIX) EC tablet 40 mg  40 mg Oral Daily Clapacs, Madie Reno, MD   40 mg at 07/16/17 0817  . propranolol (INDERAL) tablet 10 mg  10 mg Oral TID Lenward Chancellor, MD   10 mg at 07/16/17 0818  . temazepam (RESTORIL) capsule 15 mg  15 mg Oral QHS PRN Marylin Crosby, MD   15 mg at 07/15/17 2109  . venlafaxine XR (EFFEXOR-XR) 24 hr capsule 75 mg  75 mg Oral Q breakfast McNew, Tyson Babinski, MD   75 mg at 07/16/17 3810   PTA Medications: Medications Prior to Admission  Medication Sig Dispense Refill Last Dose  . albuterol (PROVENTIL HFA;VENTOLIN HFA) 108 (90 BASE) MCG/ACT inhaler Inhale 2 puffs into the lungs every 4 (four) hours as needed for wheezing or shortness of breath. 1 each 0 Taking  . amitriptyline (ELAVIL) 50 MG tablet Take 100 mg by mouth at bedtime.   Taking  . amLODipine (NORVASC) 10 MG tablet Take 1 tablet (10 mg total) by mouth daily. 30 tablet 2 Taking  . amoxicillin-clavulanate (AUGMENTIN) 875-125 MG tablet Take 1 tablet by mouth 2 (two) times daily. (Patient not taking: Reported on 07/03/2017) 20 tablet 0 Not Taking at --  . benzonatate (TESSALON PERLES) 100 MG capsule Take  1 capsule (100 mg total) by mouth every 6 (six) hours as needed for cough. (Patient not taking: Reported on 05/16/2017) 15 capsule 0 Not Taking at --  . diclofenac sodium (VOLTAREN) 1 % GEL Apply 2 g topically 3 (three) times daily. (Patient not taking: Reported on 05/16/2017) 100 g 0 Not Taking  . docusate sodium (COLACE) 100 MG capsule Take 1 capsule (100 mg total) by mouth daily. 30 capsule 3   . doxycycline (VIBRAMYCIN) 100 MG capsule Take 1 capsule (100 mg total) by mouth 2 (two) times daily. (Patient not taking: Reported on 05/16/2017) 28 capsule 0 Not Taking at --  . ibuprofen (ADVIL,MOTRIN) 800 MG tablet Take 1 tablet (800 mg total) by mouth every 8 (eight) hours as needed. (Patient not taking: Reported on 05/16/2017) 15 tablet 0 Not Taking at --  . loperamide (IMODIUM A-D) 2 MG tablet Take 1 tablet  (2 mg total) by mouth 4 (four) times daily as needed for diarrhea or loose stools. (Patient not taking: Reported on 06/07/2017) 12 tablet 0 Not Taking at --  . meloxicam (MOBIC) 15 MG tablet Take 1 tablet (15 mg total) by mouth daily. 30 tablet 0 Taking  . methocarbamol (ROBAXIN) 500 MG tablet Take 1 tablet (500 mg total) by mouth 4 (four) times daily. 16 tablet 0 Taking  . ondansetron (ZOFRAN) 4 MG tablet Take 1 tablet (4 mg total) by mouth daily as needed for nausea or vomiting. 20 tablet 1 PRN at PRN  . pravastatin (PRAVACHOL) 10 MG tablet Take 1 tablet (10 mg total) by mouth daily. 30 tablet 2 Taking  . QUEtiapine (SEROQUEL) 25 MG tablet Take 25 mg by mouth 3 (three) times daily.   Taking  . QUEtiapine (SEROQUEL) 300 MG tablet Take 1 tablet (300 mg total) by mouth at bedtime. 30 tablet 0 Taking  . solifenacin (VESICARE) 10 MG tablet Take 1 tab by mouth nightly for overactive bladder 30 tablet 2 Taking  . sucralfate (CARAFATE) 1 g tablet Take 1 tablet (1 g total) by mouth 3 (three) times daily. 30 tablet 5 Taking  . tamsulosin (FLOMAX) 0.4 MG CAPS capsule Take by mouth.   Taking  . thyroid (ARMOUR) 60 MG tablet Take 120 mg by mouth daily before breakfast.   Taking  . venlafaxine (EFFEXOR) 50 MG tablet Take 50 mg by mouth 2 (two) times daily.   Taking    Patient Stressors: Educational concerns Financial difficulties Health problems Legal issue Medication change or noncompliance Substance abuse  Patient Strengths: Ability for insight Capable of independent living Communication skills Religious Affiliation Supportive family/friends  Treatment Modalities: Medication Management, Group therapy, Case management,  1 to 1 session with clinician, Psychoeducation, Recreational therapy.   Physician Treatment Plan for Primary Diagnosis: Undifferentiated schizophrenia ([place]) Long Term Goal(s): Improvement in symptoms so as ready for discharge   Short Term Goals: Ability to demonstrate  self-control will improve  Medication Management: Evaluate patient's response, side effects, and tolerance of medication regimen.  Therapeutic Interventions: 1 to 1 sessions, Unit Group sessions and Medication administration.  Evaluation of Outcomes: Progressing  Physician Treatment Plan for Secondary Diagnosis: Principal Problem:   Undifferentiated schizophrenia ([place]) Active Problems:   Tardive dyskinesia   Tobacco use disorder  Long Term Goal(s): Improvement in symptoms so as ready for discharge   Short Term Goals: Ability to demonstrate self-control will improve     Medication Management: Evaluate patient's response, side effects, and tolerance of medication regimen.  Therapeutic Interventions: 1 to 1 sessions, Unit Group  sessions and Medication administration.  Evaluation of Outcomes: Progressing   RN Treatment Plan for Primary Diagnosis: Undifferentiated schizophrenia ([place]) Long Term Goal(s): Knowledge of disease and therapeutic regimen to maintain health will improve  Short Term Goals: Ability to verbalize frustration and anger appropriately will improve, Ability to demonstrate self-control and Ability to identify and develop effective coping behaviors will improve  Medication Management: RN will administer medications as ordered by provider, will assess and evaluate patient's response and provide education to patient for prescribed medication. RN will report any adverse and/or side effects to prescribing provider.  Therapeutic Interventions: 1 on 1 counseling sessions, Psychoeducation, Medication administration, Evaluate responses to treatment, Monitor vital signs and CBGs as ordered, Perform/monitor CIWA, COWS, AIMS and Fall Risk screenings as ordered, Perform wound care treatments as ordered.  Evaluation of Outcomes: Progressing   LCSW Treatment Plan for Primary Diagnosis: Undifferentiated schizophrenia ([place]) Long Term Goal(s): Safe transition to appropriate next level  of care at discharge, Engage patient in therapeutic group addressing interpersonal concerns.  Short Term Goals: Engage patient in aftercare planning with referrals and resources, Increase social support, Increase ability to appropriately verbalize feelings, Increase emotional regulation, Identify triggers associated with mental health/substance abuse issues and Increase skills for wellness and recovery  Therapeutic Interventions: Assess for all discharge needs, 1 to 1 time with Social worker, Explore available resources and support systems, Assess for adequacy in community support network, Educate family and significant other(s) on suicide prevention, Complete Psychosocial Assessment, Interpersonal group therapy.  Evaluation of Outcomes: Progressing   Progress in Treatment: Attending groups: Yes. Participating in groups: Yes. Taking medication as prescribed: Yes. Toleration medication: Yes. Family/Significant other contact made:Yes, [name], Patients Girlfriend and  ACTT Team contacted.  Patient understands diagnosis: Yes. Discussing patient identified problems/goals with staff: Yes. Medical problems stabilized or resolved: Yes. Denies suicidal/homicidal ideation: Yes. Issues/concerns per patient self-inventory: No. Other: None at this time.   New problem(s) identified: No, Describe:  None  New Short Term/Long Term Goal(s): "To get me a place to live."  Discharge Plan or Barriers: To go to a shelter if placement is not secured and continue to work with ACTT team.   Reason for Continuation of Hospitalization: Medication stabilization  Estimated Length of Stay: 2-3 days  Attendees: Patient:  07/16/2017 10:52 AM  Physician:Holly McNew MD 07/16/2017 10:52 AM  Nursing:  07/16/2017 10:52 AM  RN Care Manager: 07/16/2017 10:52 AM  Social Worker: Alden Hipp, LCSW 07/16/2017 10:52 AM  Recreational Therapist:  07/16/2017 10:52 AM  Other:  07/16/2017 10:52 AM  Other: Dossie Arbour, LCSW 07/16/2017  10:52 AM  Other: 07/16/2017 10:52 AM    Scribe for Treatment Team: Alden Hipp, LCSW 07/16/2017 10:52 AM

## 2017-07-16 NOTE — Progress Notes (Signed)
CSW contacted Thayer Headings with [facility] at [phone] to follow up about pt receiving ACTT services in the community. Thayer Headings did not answer, so CSW left a voicemail asking her to call back ASAP. CSW will reattempt.   Alden Hipp, MSW, LCSW 07/16/2017 9:45 AM

## 2017-07-17 NOTE — Progress Notes (Signed)
D: Pt denies SI/HI/AVH., affect is blunted, speech is  pressured and he is hyper verbal and loud. Patient's mood is cooperative with treatment plan. Patient  is interacting with peers and staff appropriately.  A: Pt was offered support and encouraged  to attend evening group.. Pt was given scheduled medications.  15 minute checks were done for safety.  R:Pt attends groups and interacts well with peers and staff. Pt is taking medication. Pt has no complaints.Pt receptive to treatment and safety maintained on unit.

## 2017-07-17 NOTE — Progress Notes (Signed)
[place] and patient spoke while walking around the unit.

## 2017-07-17 NOTE — [facility] Group Notes (Signed)
  07/17/2017  Time: 0900  Type of Therapy and Topic: Group Therapy: Goals Group: SMART Goals   Participation Level:  Active   Description of Group:   The purpose of a daily goals group is to assist and guide patients in setting recovery/wellness-related goals. The objective is to set goals as they relate to the crisis in which they were admitted. Patients will be using SMART goal modalities to set measurable goals. Characteristics of realistic goals will be discussed and patients will be assisted in setting and processing how one will reach their goal. Facilitator will also assist patients in applying interventions and coping skills learned in psycho-education groups to the SMART goal and process how one will achieve defined goal.   Therapeutic Goals:  -Patients will develop and document one goal related to or their crisis in which brought them into treatment.  -Patients will be guided by LCSW using SMART goal setting modality in how to set a measurable, attainable, realistic and time sensitive goal.  -Patients will process barriers in reaching goal.  -Patients will process interventions in how to overcome and successful in reaching goal.   Patient's Goal:  Pt continues to work towards their tx goals but has not yet reached them. Pt was able to appropriately participate in group discussion, and was able to offer support/validation to other group members. Pt reported his goal for the day is to, "talk to the person watching my dog to let them know I'll be here until Monday by the end of today."   Therapeutic Modalities:  Motivational Interviewing  Cognitive Behavioral Therapy  Crisis Intervention Model  SMART goals setting   Alden Hipp, MSW, LCSW 07/17/2017 9:36 AM

## 2017-07-17 NOTE — Progress Notes (Signed)
CSW spoke with Thayer Headings at [facility] at [phone]  to discuss pt's discharge plan. CSW explained that the tx team has decided to keep pt until Monday to ensure a safe discharge plan. CSW asked if Thayer Headings was able to find pt a boarding home in between now and Monday, as pt will be discharged Monday. Thayer Headings reported, "We will start making calls today and will have something set up by Monday." CSW will continue to coordinate with Thayer Headings from [facility] as needed for updates/discharge planning.   Alden Hipp, MSW, LCSW 07/17/2017 9:38 AM

## 2017-07-17 NOTE — Progress Notes (Signed)
   07/17/17 0730  What Happened  Was fall witnessed? No  Was patient injured? No  Stated prior activity ambulating-unassisted  Follow Up  MD notified Dr. Wonda Olds  Time MD notified [phone]  Adult Fall Risk Assessment  Risk Factor Category (scoring not indicated) Fall has occurred during this admission (document High fall risk)  Patient's Fall Risk High Fall Risk (>13 points)  Adult Fall Risk Interventions  Required Bundle Interventions *See Row Information* High fall risk - low, moderate, and high requirements implemented  Vitals  BP 134/77  Pulse Rate 75  Resp 18  Oxygen Therapy  SpO2 98 %

## 2017-07-17 NOTE — Plan of Care (Signed)
  Problem: Coping: Goal: Ability to verbalize feelings will improve Outcome: Progressing   Problem: Education: Goal: Knowledge of [place] [facility] Education information/materials will improve Outcome: Progressing Goal: Emotional status will improve Outcome: Progressing   Problem: Education: Goal: Emotional status will improve Outcome: Progressing   Problem: Activity: Goal: Sleeping patterns will improve Outcome: Progressing

## 2017-07-17 NOTE — Plan of Care (Signed)
  Problem: Coping: Goal: Ability to cope will improve Outcome: Progressing Goal: Ability to verbalize feelings will improve Outcome: Progressing   Problem: Education: Goal: Knowledge of [place] [facility] Education information/materials will improve Outcome: Progressing Goal: Emotional status will improve Outcome: Progressing   Problem: Safety: Goal: Ability to demonstrate self-control will improve Outcome: Progressing Goal: Ability to redirect hostility and anger into socially appropriate behaviors will improve Outcome: Progressing   Problem: Activity: Goal: Sleeping patterns will improve Outcome: Progressing   Problem: Spiritual Needs Goal: Ability to function at adequate level Outcome: Progressing    Pleasant and cooperative.  Medication and group compliant.  Visible in the milieu.  Speech is loud at times.  Support and encouragement offered. Safety maintained.

## 2017-07-17 NOTE — Progress Notes (Signed)
Staff informed that patient had fell dayroom.  Asked patient what happened.  Stated that he was walking and just lost his balance.  Denies hitting his head.  Denies any pain anywhere.  Vital signs obtained.
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
## 2017-07-18 MED ORDER — IPRATROPIUM-ALBUTEROL 0.5-2.5 (3) MG/3ML IN SOLN
3.0000 mL | Freq: Two times a day (BID) | RESPIRATORY_TRACT | Status: DC
  Administered 2017-07-18 – 2017-07-23 (×10): 3 mL via RESPIRATORY_TRACT
  Filled 2017-07-18 (×10): qty 3

## 2017-07-18 MED ORDER — TRAMADOL HCL 50 MG PO TABS
50.0000 mg | ORAL_TABLET | Freq: Four times a day (QID) | ORAL | Status: DC | PRN
  Administered 2017-07-18 – 2017-07-22 (×8): 50 mg via ORAL
  Filled 2017-07-18 (×8): qty 1

## 2017-07-18 MED ORDER — CLONIDINE HCL 0.1 MG PO TABS
0.1000 mg | ORAL_TABLET | Freq: Once | ORAL | Status: AC
  Administered 2017-07-18: 0.1 mg via ORAL
  Filled 2017-07-18: qty 1

## 2017-07-18 MED ORDER — LORAZEPAM 1 MG PO TABS
1.0000 mg | ORAL_TABLET | ORAL | Status: DC | PRN
  Administered 2017-07-18: 1 mg via ORAL
  Filled 2017-07-18: qty 1

## 2017-07-18 NOTE — Plan of Care (Signed)
  Problem: Coping: Goal: Ability to cope will improve Outcome: Progressing Goal: Ability to verbalize feelings will improve Outcome: Progressing   Problem: Education: Goal: Knowledge of [place] [facility] Education information/materials will improve Outcome: Progressing Goal: Emotional status will improve Outcome: Progressing   Problem: Safety: Goal: Ability to demonstrate self-control will improve Outcome: Progressing Goal: Ability to redirect hostility and anger into socially appropriate behaviors will improve Outcome: Progressing   Problem: Activity: Goal: Sleeping patterns will improve Outcome: Progressing   Problem: Spiritual Needs Goal: Ability to function at adequate level Outcome: Progressing    Patient is pleasant and cooperative.  Presents with a bright affect although rates his depression as a 8/10.  Visible in the milieu.  Interacts with peers appropriately.  Intrusive with staff at times.  Speech tangential and loud.  Support and encouragement offered. Safety maitnained.
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
## 2017-07-18 NOTE — [facility] Group Notes (Signed)
07/18/2017 1PM  Type of Therapy/Topic:  Group Therapy:  Feelings about Diagnosis  Participation Level:  Active   Description of Group:   This group will allow patients to explore their thoughts and feelings about diagnoses they have received. Patients will be guided to explore their level of understanding and acceptance of these diagnoses. Facilitator will encourage patients to process their thoughts and feelings about the reactions of others to their diagnosis and will guide patients in identifying ways to discuss their diagnosis with significant others in their lives. This group will be process-oriented, with patients participating in exploration of their own experiences, giving and receiving support, and processing challenge from other group members.   Therapeutic Goals: 1. Patient will demonstrate understanding of diagnosis as evidenced by identifying two or more symptoms of the disorder 2. Patient will be able to express two feelings regarding the diagnosis 3. Patient will demonstrate their ability to communicate their needs through discussion and/or role play  Summary of Patient Progress: Pt continues to work towards their tx goals but has not yet reached them. Pt was able to appropriately participate in group discussion, and was able to offer support/validation to other group members. Pt was hyperverbal at times but was able to accept redirection. Pt reported knowing and understanding his diagnosis and stated, "when I first found out I was scared and afraid. But, it felt like it was true."    Therapeutic Modalities:   Cognitive Behavioral Therapy Brief Therapy  Alden Hipp, MSW, LCSW 07/18/2017 2:04 PM

## 2017-07-18 NOTE — Plan of Care (Signed)
Patient slept for Estimated Hours of 6.45; Precautionary checks every 15 minutes for safety maintained, room free of safety hazards, patient sustains no injury or falls during this shift.  Problem: Coping: Goal: Ability to cope will improve Outcome: Progressing Goal: Ability to verbalize feelings will improve Outcome: Progressing   Problem: Education: Goal: Knowledge of [place] [facility] Education information/materials will improve Outcome: Progressing Goal: Emotional status will improve Outcome: Progressing   Problem: Safety: Goal: Ability to demonstrate self-control will improve Outcome: Progressing Goal: Ability to redirect hostility and anger into socially appropriate behaviors will improve Outcome: Progressing   Problem: Activity: Goal: Sleeping patterns will improve Outcome: Progressing   Problem: Spiritual Needs Goal: Ability to function at adequate level Outcome: Progressing

## 2017-07-18 NOTE — Progress Notes (Signed)
Patient ID: Christopher Mason, male   DOB: 1960/08/02, 57 y.o.   MRN: [id number] Animated, speech loud slurred and fragmented, uncoordinated walks, visible in the milieu, interacting well with peers, c/o 10/10 generalized pain and received Tylenol 650 mg PO PRN and Temazepam 15 mg capsule at bedtime for sleep.

## 2017-07-19 NOTE — Plan of Care (Signed)
  Problem: Coping: Goal: Ability to cope will improve Outcome: Progressing Goal: Ability to verbalize feelings will improve Outcome: Progressing Note:  Verbalizes feelings without any difficulty. States that wants to attend all groups, work on discharge plan and find a place to live for himself and his dog.     Problem: Education: Goal: Emotional status will improve Outcome: Progressing Note:  Rates depression as 3/10, hopelessness as 2/10 and anxiety as a 4/10.  Interacting with peers and staff appropriately.     Problem: Safety: Goal: Ability to demonstrate self-control will improve Outcome: Progressing Goal: Ability to redirect hostility and anger into socially appropriate behaviors will improve Outcome: Progressing   Problem: Spiritual Needs Goal: Ability to function at adequate level Outcome: Progressing   Problem: Education: Goal: Knowledge of [place] [facility] Education information/materials will improve Outcome: Completed/Met

## 2017-07-19 NOTE — [facility] Group Notes (Signed)
07/19/2017  Time: 1PM  Type of Therapy/Topic:  Group Therapy:  Balance in Life  Participation Level:  Did Not Attend  Description of Group:   This group will address the concept of balance and how it feels and looks when one is unbalanced. Patients will be encouraged to process areas in their lives that are out of balance and identify reasons for remaining unbalanced. Facilitators will guide patients in utilizing problem-solving interventions to address and correct the stressor making their life unbalanced. Understanding and applying boundaries will be explored and addressed for obtaining and maintaining a balanced life. Patients will be encouraged to explore ways to assertively make their unbalanced needs known to significant others in their lives, using other group members and facilitator for support and feedback.  Therapeutic Goals: 1. Patient will identify two or more emotions or situations they have that consume much of in their lives. 2. Patient will identify signs/triggers that life has become out of balance:  3. Patient will identify two ways to set boundaries in order to achieve balance in their lives:  4. Patient will demonstrate ability to communicate their needs through discussion and/or role plays  Summary of Patient Progress: Pt was invited to attend group but chose not to attend. CSW will continue to encourage pt to attend group throughout their admission.    Therapeutic Modalities:   Cognitive Behavioral Therapy Solution-Focused Therapy Assertiveness Training  Alden Hipp, MSW, LCSW 07/19/2017 2:16 PM

## 2017-07-19 NOTE — [facility] Group Notes (Signed)
LCSW Group Therapy Note 07/19/2017 9:00 AM  Type of Therapy and Topic:  Group Therapy:  Setting Goals  Participation Level:  Did Not Attend  Description of Group: In this process group, patients discussed using strengths to work toward goals and address challenges.  Patients identified two positive things about themselves and one goal they were working on.  Patients were given the opportunity to share openly and support each other's plan for self-empowerment.  The group discussed the value of gratitude and were encouraged to have a daily reflection of positive characteristics or circumstances.  Patients were encouraged to identify a plan to utilize their strengths to work on current challenges and goals.  Therapeutic Goals 1. Patient will verbalize personal strengths/positive qualities and relate how these can assist with achieving desired personal goals 2. Patients will verbalize affirmation of peers plans for personal change and goal setting 3. Patients will explore the value of gratitude and positive focus as related to successful achievement of goals 4. Patients will verbalize a plan for regular reinforcement of personal positive qualities and circumstances.  Summary of Patient Progress:  Eustacio was invited to today's group, but chose not to attend.     Therapeutic Modalities Cognitive Behavioral Therapy Motivational Interviewing    Devona Konig, [place] 07/19/2017 11:24 AM

## 2017-07-19 NOTE — Plan of Care (Signed)
Patient slept for Estimated Hours of 7.50; Precautionary checks every 15 minutes for safety maintained, room free of safety hazards, patient sustains no injury or falls during this shift.  Problem: Coping: Goal: Ability to cope will improve Outcome: Progressing Goal: Ability to verbalize feelings will improve Outcome: Progressing   Problem: Education: Goal: Knowledge of [facility] Education information/materials will improve Outcome: Progressing Goal: Emotional status will improve Outcome: Progressing   Problem: Safety: Goal: Ability to demonstrate self-control will improve Outcome: Progressing Goal: Ability to redirect hostility and anger into socially appropriate behaviors will improve Outcome: Progressing   Problem: Activity: Goal: Sleeping patterns will improve Outcome: Progressing   Problem: Spiritual Needs Goal: Ability to function at adequate level Outcome: Progressing

## 2017-07-19 NOTE — Progress Notes (Signed)
Patient ID: Christopher Mason, male   DOB: 04-Nov-1960, 57 y.o.   MRN: [id number] Visible in the milieu, socializing well with peers; no crying spells, mood and affect suitable, appropriate, speech still slurred and loud at times; NEB treatment provided by RT, verbalized relief of pain, medicated without PRNs at bedtime; denied SI/HI

## 2017-07-19 NOTE — Plan of Care (Addendum)
Patient found in day room with his girlfriend visiting upon my arrival. Patient is visible and social throughout the evening. Patient mood is happy. Denies SI/HI/AVH. The visitor had a positive impact on patient mood. Complains of SOB, given Albuterol inhaler with positive results. Patient given nebulizer treatment, tolerated well. Patient complains of pain in neck and shoulders 9/10, given Tramadol with positive results. Given Restoril for sleep with positive results. Reports eating and voiding adequately. Compliant with HS medications and staff direction. Q 15 minute checks maintained. Will continue to monitor throughout the shift.  Patient slept 7 hours. No apparent distress. BP elevated. Given Clonidine. Compliant with Levothyroxine. Will endorse care to oncoming shift.  Problem: Coping: Goal: Ability to cope will improve Outcome: Progressing Goal: Ability to verbalize feelings will improve Outcome: Progressing   Problem: Education: Goal: Emotional status will improve Outcome: Progressing   Problem: Safety: Goal: Ability to demonstrate self-control will improve Outcome: Progressing Goal: Ability to redirect hostility and anger into socially appropriate behaviors will improve Outcome: Progressing   Problem: Activity: Goal: Sleeping patterns will improve Outcome: Progressing
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
## 2017-07-20 MED ORDER — TRAZODONE HCL 100 MG PO TABS
100.0000 mg | ORAL_TABLET | Freq: Every evening | ORAL | Status: DC | PRN
  Administered 2017-07-21: 100 mg via ORAL
  Filled 2017-07-20: qty 1
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
## 2017-07-20 NOTE — Plan of Care (Signed)
Patient has the ability to cope and has been interacting well with other members on the unit. Patient usually attends groups, however, he states that he is tired and wants to rest. Patient states that his goal for today is to "find a place for me and Christopher Mason", which is also why he reports being anxious and rates his anxiety level a "6/10". Patient has been demonstrating self-control on the unit and has the ability to redirect hostility and anger into appropriate behaviors. Patient states that he slept good last night even though "I woke up three times and went back to sleep every time". Patient has been functioning at an adequate level and remains safe on the unit at this time.  Problem: Coping: Goal: Ability to cope will improve Outcome: Progressing Goal: Ability to verbalize feelings will improve Outcome: Progressing   Problem: Education: Goal: Emotional status will improve Outcome: Progressing   Problem: Safety: Goal: Ability to demonstrate self-control will improve Outcome: Progressing Goal: Ability to redirect hostility and anger into socially appropriate behaviors will improve Outcome: Progressing   Problem: Activity: Goal: Sleeping patterns will improve Outcome: Progressing   Problem: Spiritual Needs Goal: Ability to function at adequate level Outcome: Progressing

## 2017-07-20 NOTE — Progress Notes (Signed)
D- Patient alert and oriented. Patient presents in a pleasant mood stating that he slept good last night, "I woke up three times and went back to sleep every time". Patient does endorse some anxiety stating that he's anxious  "to find a place for me and Tiny Mack" and he rates hos anxiety a "6/10". Patient denies SI, HI, AVH, and pain at this time. Patient also denies any signs/symptoms of depression at this time. Patient's goal for today is to "attend all groups".  A- Scheduled medications administered to patient, per MD orders. Support and encouragement provided.  Routine safety checks conducted every 15 minutes. Patient informed to notify staff with problems or concerns.  R- No adverse drug reactions noted. Patient contracts for safety at this time. Patient compliant with medications and treatment plan. Patient receptive, calm, and cooperative. Patient interacts well with others on the unit.  Patient remains safe at this time.

## 2017-07-20 NOTE — BHH Group Notes (Signed)
LCSW Group Therapy Note  07/20/2017 1:00 pm  Type of Therapy and Topic:  Group Therapy:  Feelings around Relapse and Recovery  Participation Level:  Did Not Attend   Description of Group:    Patients in this group will discuss emotions they experience before and after a relapse. They will process how experiencing these feelings, or avoidance of experiencing them, relates to having a relapse. Facilitator will guide patients to explore emotions they have related to recovery. Patients will be encouraged to process which emotions are more powerful. They will be guided to discuss the emotional reaction significant others in their lives may have to their relapse or recovery. Patients will be assisted in exploring ways to respond to the emotions of others without this contributing to a relapse.  Therapeutic Goals: 1. Patient will identify two or more emotions that lead to a relapse for them 2. Patient will identify two emotions that result when they relapse 3. Patient will identify two emotions related to recovery 4. Patient will demonstrate ability to communicate their needs through discussion and/or role plays   Summary of Patient Progress:  Ulis was invited to today's group, but chose not to attend.   Therapeutic Modalities:   Cognitive Behavioral Therapy Solution-Focused Therapy Assertiveness Training Relapse Prevention Therapy   Devona Konig, LCSW 07/20/2017 3:35 PM

## 2017-07-20 NOTE — Tx Team (Signed)
Interdisciplinary Treatment and Diagnostic Plan Update  07/20/2017 Time of Session: [place] MRN: [id number]  Principal Diagnosis: Undifferentiated schizophrenia [facility])  Secondary Diagnoses: Principal Problem:   Undifferentiated schizophrenia ([place]) Active Problems:   Tardive dyskinesia   Tobacco use disorder   Current Medications:  Current Facility-Administered Medications  Medication Dose Route Frequency Provider Last Rate Last Dose  . acetaminophen (TYLENOL) tablet 650 mg  650 mg Oral Q6H PRN Clapacs, Madie Reno, MD   650 mg at 07/17/17 2058  . albuterol (PROVENTIL HFA;VENTOLIN HFA) 108 (90 Base) MCG/ACT inhaler 2 puff  2 puff Inhalation BID PRN Marylin Crosby, MD   2 puff at 07/20/17 0831  . alum & mag hydroxide-simeth (MAALOX/MYLANTA) 200-200-20 MG/5ML suspension 30 mL  30 mL Oral Q4H PRN Clapacs, John T, MD      . amitriptyline (ELAVIL) tablet 50 mg  50 mg Oral QHS McNew, Tyson Babinski, MD   50 mg at 07/19/17 2110  . cloNIDine (CATAPRES) tablet 0.1 mg  0.1 mg Oral BID PRN Marylin Crosby, MD   0.1 mg at 07/20/17 7858  . docusate sodium (COLACE) capsule 100 mg  100 mg Oral BID Clapacs, John T, MD   100 mg at 07/20/17 0830  . ipratropium-albuterol (DUONEB) 0.5-2.5 (3) MG/3ML nebulizer solution 3 mL  3 mL Nebulization BID McNew, Tyson Babinski, MD   3 mL at 07/20/17 0959  . levothyroxine (SYNTHROID, LEVOTHROID) tablet 125 mcg  125 mcg Oral Q0600 Marylin Crosby, MD   125 mcg at 07/20/17 [phone]  . lisinopril (PRINIVIL,ZESTRIL) tablet 40 mg  40 mg Oral Daily McNew, Tyson Babinski, MD   40 mg at 07/20/17 0830  . magnesium hydroxide (MILK OF MAGNESIA) suspension 30 mL  30 mL Oral Daily PRN Clapacs, John T, MD      . meloxicam (MOBIC) tablet 15 mg  15 mg Oral Daily Clapacs, John T, MD   15 mg at 07/20/17 0830  . pantoprazole (PROTONIX) EC tablet 40 mg  40 mg Oral Daily Clapacs, Madie Reno, MD   40 mg at 07/20/17 0830  . propranolol (INDERAL) tablet 10 mg  10 mg Oral TID Lenward Chancellor, MD   10 mg at  07/20/17 0830  . traMADol (ULTRAM) tablet 50 mg  50 mg Oral Q6H PRN Marylin Crosby, MD   50 mg at 07/19/17 2112  . traZODone (DESYREL) tablet 100 mg  100 mg Oral QHS PRN McNew, Tyson Babinski, MD      . venlafaxine XR (EFFEXOR-XR) 24 hr capsule 75 mg  75 mg Oral Q breakfast McNew, Tyson Babinski, MD   75 mg at 07/20/17 0830   PTA Medications: Medications Prior to Admission  Medication Sig Dispense Refill Last Dose  . albuterol (PROVENTIL HFA;VENTOLIN HFA) 108 (90 BASE) MCG/ACT inhaler Inhale 2 puffs into the lungs every 4 (four) hours as needed for wheezing or shortness of breath. 1 each 0 Taking  . amitriptyline (ELAVIL) 50 MG tablet Take 100 mg by mouth at bedtime.   Taking  . amLODipine (NORVASC) 10 MG tablet Take 1 tablet (10 mg total) by mouth daily. 30 tablet 2 Taking  . amoxicillin-clavulanate (AUGMENTIN) 875-125 MG tablet Take 1 tablet by mouth 2 (two) times daily. (Patient not taking: Reported on 07/03/2017) 20 tablet 0 Not Taking at --  . benzonatate (TESSALON PERLES) 100 MG capsule Take 1 capsule (100 mg total) by mouth every 6 (six) hours as needed for cough. (Patient not taking: Reported on 05/16/2017) 15 capsule 0  Not Taking at --  . diclofenac sodium (VOLTAREN) 1 % GEL Apply 2 g topically 3 (three) times daily. (Patient not taking: Reported on 05/16/2017) 100 g 0 Not Taking  . docusate sodium (COLACE) 100 MG capsule Take 1 capsule (100 mg total) by mouth daily. 30 capsule 3   . doxycycline (VIBRAMYCIN) 100 MG capsule Take 1 capsule (100 mg total) by mouth 2 (two) times daily. (Patient not taking: Reported on 05/16/2017) 28 capsule 0 Not Taking at --  . ibuprofen (ADVIL,MOTRIN) 800 MG tablet Take 1 tablet (800 mg total) by mouth every 8 (eight) hours as needed. (Patient not taking: Reported on 05/16/2017) 15 tablet 0 Not Taking at --  . loperamide (IMODIUM A-D) 2 MG tablet Take 1 tablet (2 mg total) by mouth 4 (four) times daily as needed for diarrhea or loose stools. (Patient not taking: Reported on  06/07/2017) 12 tablet 0 Not Taking at --  . meloxicam (MOBIC) 15 MG tablet Take 1 tablet (15 mg total) by mouth daily. 30 tablet 0 Taking  . methocarbamol (ROBAXIN) 500 MG tablet Take 1 tablet (500 mg total) by mouth 4 (four) times daily. 16 tablet 0 Taking  . ondansetron (ZOFRAN) 4 MG tablet Take 1 tablet (4 mg total) by mouth daily as needed for nausea or vomiting. 20 tablet 1 PRN at PRN  . pravastatin (PRAVACHOL) 10 MG tablet Take 1 tablet (10 mg total) by mouth daily. 30 tablet 2 Taking  . QUEtiapine (SEROQUEL) 25 MG tablet Take 25 mg by mouth 3 (three) times daily.   Taking  . QUEtiapine (SEROQUEL) 300 MG tablet Take 1 tablet (300 mg total) by mouth at bedtime. 30 tablet 0 Taking  . solifenacin (VESICARE) 10 MG tablet Take 1 tab by mouth nightly for overactive bladder 30 tablet 2 Taking  . sucralfate (CARAFATE) 1 g tablet Take 1 tablet (1 g total) by mouth 3 (three) times daily. 30 tablet 5 Taking  . tamsulosin (FLOMAX) 0.4 MG CAPS capsule Take by mouth.   Taking  . thyroid (ARMOUR) 60 MG tablet Take 120 mg by mouth daily before breakfast.   Taking  . venlafaxine (EFFEXOR) 50 MG tablet Take 50 mg by mouth 2 (two) times daily.   Taking    Patient Stressors: Educational concerns Financial difficulties Health problems Legal issue Medication change or noncompliance Substance abuse  Patient Strengths: Ability for insight Capable of independent living Communication skills Religious Affiliation Supportive family/friends  Treatment Modalities: Medication Management, Group therapy, Case management,  1 to 1 session with clinician, Psychoeducation, Recreational therapy.   Physician Treatment Plan for Primary Diagnosis: Undifferentiated schizophrenia ([place]) Long Term Goal(s): Improvement in symptoms so as ready for discharge   Short Term Goals: Ability to demonstrate self-control will improve  Medication Management: Evaluate patient's response, side effects, and tolerance of medication  regimen.  Therapeutic Interventions: 1 to 1 sessions, Unit Group sessions and Medication administration.  Evaluation of Outcomes: Progressing  Physician Treatment Plan for Secondary Diagnosis: Principal Problem:   Undifferentiated schizophrenia ([place]) Active Problems:   Tardive dyskinesia   Tobacco use disorder  Long Term Goal(s): Improvement in symptoms so as ready for discharge   Short Term Goals: Ability to demonstrate self-control will improve     Medication Management: Evaluate patient's response, side effects, and tolerance of medication regimen.  Therapeutic Interventions: 1 to 1 sessions, Unit Group sessions and Medication administration.  Evaluation of Outcomes: Progressing   RN Treatment Plan for Primary Diagnosis: Undifferentiated schizophrenia ([place]) Long Term Goal(s): Knowledge  of disease and therapeutic regimen to maintain health will improve  Short Term Goals: Ability to verbalize frustration and anger appropriately will improve, Ability to demonstrate self-control and Ability to identify and develop effective coping behaviors will improve  Medication Management: RN will administer medications as ordered by provider, will assess and evaluate patient's response and provide education to patient for prescribed medication. RN will report any adverse and/or side effects to prescribing provider.  Therapeutic Interventions: 1 on 1 counseling sessions, Psychoeducation, Medication administration, Evaluate responses to treatment, Monitor vital signs and CBGs as ordered, Perform/monitor CIWA, COWS, AIMS and Fall Risk screenings as ordered, Perform wound care treatments as ordered.  Evaluation of Outcomes: Progressing   LCSW Treatment Plan for Primary Diagnosis: Undifferentiated schizophrenia ([place]) Long Term Goal(s): Safe transition to appropriate next level of care at discharge, Engage patient in therapeutic group addressing interpersonal concerns.  Short Term Goals: Engage  patient in aftercare planning with referrals and resources, Increase social support, Increase ability to appropriately verbalize feelings, Increase emotional regulation, Identify triggers associated with mental health/substance abuse issues and Increase skills for wellness and recovery  Therapeutic Interventions: Assess for all discharge needs, 1 to 1 time with Social worker, Explore available resources and support systems, Assess for adequacy in community support network, Educate family and significant other(s) on suicide prevention, Complete Psychosocial Assessment, Interpersonal group therapy.  Evaluation of Outcomes: Progressing   Progress in Treatment: Attending groups: Yes. Participating in groups: Yes. Taking medication as prescribed: Yes. Toleration medication: Yes. Family/Significant other contact made:Yes, [name], Patients Girlfriend and  ACTT Team contacted.  Patient understands diagnosis: Yes. Discussing patient identified problems/goals with staff: Yes. Medical problems stabilized or resolved: Yes. Denies suicidal/homicidal ideation: Yes. Issues/concerns per patient self-inventory: No. Other: None at this time.   New problem(s) identified: No, Describe:  None  New Short Term/Long Term Goal(s): "To get me a place to live."  Discharge Plan or Barriers: To go to a shelter if placement is not secured and continue to work with ACTT team.   Reason for Continuation of Hospitalization: Medication stabilization  Estimated Length of Stay: 2-3 days  Attendees: Patient: 07/20/2017 11:18 AM  Physician: Dr. Wonda Olds, MD 07/20/2017 11:18 AM  Nursing: Roetta Sessions, RN 07/20/2017 11:18 AM  RN Care Manager: 07/20/2017 11:18 AM  Social Worker: Alden Hipp, LCSW 07/20/2017 11:18 AM  Recreational Therapist:  07/20/2017 11:18 AM  Other: Thea Silversmith, LCSW 07/20/2017 11:18 AM  Other:  07/20/2017 11:18 AM  Other: 07/20/2017 11:18 AM     Scribe for Treatment Team: Alden Hipp,  LCSW 07/20/2017 11:25 AM

## 2017-07-20 NOTE — BHH Group Notes (Signed)
[place] Group Notes:  (Nursing/MHT/Case Management/Adjunct)  Date:  07/20/2017  Time:  12:35 AM  Type of Therapy:  Group Therapy  Participation Level:  Active  Participation Quality:  Appropriate  Affect:  Appropriate  Cognitive:  Alert  Insight:  Good  Engagement in Group:  Engaged  Modes of Intervention:  Support  Summary of Progress/Problems:  Christopher Mason 07/20/2017, 12:35 AM

## 2017-07-20 NOTE — Progress Notes (Signed)
CSW spoke with Thayer Headings from [facility] regarding pt's discharge plans. Thayer Headings reported that she is not able to transport pt upon discharge Monday due to him not having signed a consent for transport and for treatment. CSW asked if she could have him sign it on Monday while still at the hospital. Thayer Headings reported she is not willing to do that. Pt does not have any other support that is able to transport him on Monday, so CSW will have to arrange a taxi for pt to [facility] on Monday, 07/23/17. Thayer Headings requests that pt arrive to [facility] by 10AM. CSW will follow up as needed.   Alden Hipp, MSW, LCSW 07/20/2017 3:38 PM

## 2017-07-21 NOTE — Plan of Care (Signed)
Patient visible in the milieu with a pleasant affect.Interacting with staff & peers all the time.Looking forward for discharge Monday.No issues verbalized.

## 2017-07-21 NOTE — [facility] Group Notes (Signed)
LCSW Group Therapy Note   07/21/2017 1:15pm   Type of Therapy and Topic:  Group Therapy:  Trust and Honesty  Participation Level:  Active  Description of Group:    In this group patients will be asked to explore the value of being honest.  Patients will be guided to discuss their thoughts, feelings, and behaviors related to honesty and trusting in others. Patients will process together how trust and honesty relate to forming relationships with peers, family members, and self. Each patient will be challenged to identify and express feelings of being vulnerable. Patients will discuss reasons why people are dishonest and identify alternative outcomes if one was truthful (to self or others). This group will be process-oriented, with patients participating in exploration of their own experiences, giving and receiving support, and processing challenge from other group members.   Therapeutic Goals: 1. Patient will identify why honesty is important to relationships and how honesty overall affects relationships.  2. Patient will identify a situation where they lied or were lied too and the  feelings, thought process, and behaviors surrounding the situation 3. Patient will identify the meaning of being vulnerable, how that feels, and how that correlates to being honest with self and others. 4. Patient will identify situations where they could have told the truth, but instead lied and explain reasons of dishonesty.   Summary of Patient Progress Patient scored his mood 10 (10 best). He stated he is going home on Monday. Patient discussed thoughts, feelings, and behaviors related to honesty and trusting in others. The pt shared that he tries to be honest with everyone around him. Patients participated in exploration of their own experiences, giving and receiving support, and processing challenge from other group members.     Therapeutic Modalities:   Cognitive Behavioral Therapy Solution Focused  Therapy Motivational Interviewing Brief Therapy  Glorine Hanratty  CUEBAS-COLON, LCSW 07/21/2017 12:57 PM

## 2017-07-21 NOTE — Progress Notes (Signed)
Patient stayed in the milieu, frequently wanting to talk to staff. Disheveled with poor hygiene, unkept. Patient remained pleasant and cooperative per encouragements. Attended group and was cooperative. Had a snack and received his HS medications. Currently in bed resting and staff continue to monitor.

## 2017-07-21 NOTE — [facility] Group Notes (Signed)
[place] Group Notes:  (Nursing/MHT/Case Management/Adjunct)  Date:  07/21/2017  Time:  10:54 PM  Type of Therapy:  Group Therapy  Participation Level:  Active  Participation Quality:  Left out before group oveer.  Summary of Progress/Problems:  Christopher Mason 07/21/2017, 10:54 PM
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
## 2017-07-21 NOTE — Plan of Care (Signed)
Visible in the milieu, disheveled, restless but pleasant. Taking medications and eating well.

## 2017-07-22 NOTE — Progress Notes (Signed)
Patient visible in the milieu with a pleasant affect.Interacting with staff & peers all the time, compliant with medications. He received nebulizer and showered on shift. No issues to report on shift at this time.
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
## 2017-07-22 NOTE — Progress Notes (Signed)
D- Patient alert and oriented. Patient presents in a pleasant mood on assessment stating that he slept fair last night, but is still tired. Patient states to this writer that he will be discharged tomorrow and is happy that he will be going to a facility that will allow him to have his dog. Patient denies SI, HI, AVH, and pain at this time. Patient also denies any signs/symptoms of depression/anxiety at this time. Patient's goal for today is "to get some more sleep" and he states that he will go to all groups in order to accomplish his goal.  A- Scheduled medications administered to patient, per MD orders. Support and encouragement provided.  Routine safety checks conducted every 15 minutes.  Patient informed to notify staff with problems or concerns.  R- No adverse drug reactions noted. Patient contracts for safety at this time. Patient compliant with medications and treatment plan. Patient receptive, calm, and cooperative. Patient interacts well with others on the unit. Patient remains safe at this time.

## 2017-07-22 NOTE — Plan of Care (Signed)
Patient has the ability to cope and has been interacting well with other members on the unit except for two occasions where patient has felt like other members on the unit have "disrespected me, I don't like when people disrespect me". Patient usually attends groups, however, he states that he is tired and his goal for today is "get more sleep". Patient has been demonstrating self-control on the unit and has the ability to redirect hostility and anger into appropriate behaviors. Patient states that he slept fair last night, however, he still reports being tired. Patient has been functioning at an adequate level and remains safe on the unit at this time.  Problem: Coping: Goal: Ability to cope will improve Outcome: Progressing Goal: Ability to verbalize feelings will improve Outcome: Progressing   Problem: Education: Goal: Emotional status will improve Outcome: Progressing   Problem: Safety: Goal: Ability to demonstrate self-control will improve Outcome: Progressing Goal: Ability to redirect hostility and anger into socially appropriate behaviors will improve Outcome: Progressing   Problem: Activity: Goal: Sleeping patterns will improve Outcome: Progressing   Problem: Spiritual Needs Goal: Ability to function at adequate level Outcome: Progressing

## 2017-07-22 NOTE — BHH Group Notes (Signed)
LCSW Group Therapy Note 07/22/2017 1:15pm  Type of Therapy and Topic: Group Therapy: Feelings Around Returning Home & Establishing a Supportive Framework and Supporting Oneself When Supports Not Available  Participation Level: Minimal  Description of Group:  Patients first processed thoughts and feelings about upcoming discharge. These included fears of upcoming changes, lack of change, new living environments, judgements and expectations from others and overall stigma of mental health issues. The group then discussed the definition of a supportive framework, what that looks and feels like, and how do to discern it from an unhealthy non-supportive network. The group identified different types of supports as well as what to do when your family/friends are less than helpful or unavailable  Therapeutic Goals  1. Patient will identify one healthy supportive network that they can use at discharge. 2. Patient will identify one factor of a supportive framework and how to tell it from an unhealthy network. 3. Patient able to identify one coping skill to use when they do not have positive supports from others. 4. Patient will demonstrate ability to communicate their needs through discussion and/or role plays.  Summary of Patient Progress:  Pt reported he felt sleepy. He stated he was sure he is going home tomorrow. Pt was in and out of the room throughout the group discussion and did not participate any further.    Therapeutic Modalities Cognitive Behavioral Therapy Motivational Interviewing   Irianna Gilday  CUEBAS-COLON, LCSW 07/22/2017 11:07 AM

## 2017-07-23 MED ORDER — AMITRIPTYLINE HCL 50 MG PO TABS: 50.0000 mg | ORAL_TABLET | Freq: Every day | ORAL | 0 refills | Status: DC

## 2017-07-23 MED ORDER — VENLAFAXINE HCL ER 75 MG PO CP24: 75.0000 mg | ORAL_CAPSULE | Freq: Every day | ORAL | 0 refills | Status: DC

## 2017-07-23 MED ORDER — LEVOTHYROXINE SODIUM 125 MCG PO TABS: 125.0000 ug | ORAL_TABLET | Freq: Every day | ORAL | 0 refills | Status: DC

## 2017-07-23 MED ORDER — PALIPERIDONE PALMITATE 234 MG/1.5ML IM SUSP: 234.0000 mg | Freq: Once | INTRAMUSCULAR | 0 refills | Status: DC

## 2017-07-23 MED ORDER — LISINOPRIL 40 MG PO TABS: 40.0000 mg | ORAL_TABLET | Freq: Every day | ORAL | 0 refills | Status: DC

## 2017-07-23 MED ORDER — PROPRANOLOL HCL 10 MG PO TABS: 10.0000 mg | ORAL_TABLET | Freq: Three times a day (TID) | ORAL | 0 refills | Status: DC

## 2017-07-23 NOTE — Progress Notes (Signed)
Patient stayed in the milieu, frequently wanting to talk to staff. Expressing readiness for discharge. Disheveled with poor hygiene, unkept. Patient remained pleasant and cooperative per encouragements.Visible in the milieu  and  cooperative. Had a snack and received his HS medications. Currently in bed resting and staff continue to monitor.

## 2017-07-23 NOTE — Plan of Care (Signed)
Was visible in the milieu, compliant with treatment

## 2017-07-23 NOTE — Progress Notes (Signed)
  [facility] Adult Case Management Discharge Plan :  Will you be returning to the same living situation after discharge:  No. At discharge, do you have transportation home?: Yes,  cab Do you have the ability to pay for your medications: Yes,  [employer]  Release of information consent forms completed and in the chart;  Patient's signature needed at discharge.  Patient to Follow up at: Follow-up Information    [place].. Go on 07/23/2017.   Why:  Please go to [facility] on Monday, 07/23/17 at 11AM. Thank you! Contact information: [place] [address] [phone]           Next level of care provider has access to [place] and Suicide Prevention discussed: Yes,  with pt and his ACT Team.   Have you used any form of tobacco in the last 30 days? (Cigarettes, Smokeless Tobacco, Cigars, and/or Pipes): Yes  Has patient been referred to the Quitline?: Patient refused referral  Patient has been referred for addiction treatment: N/A  Alden Hipp, LCSW 07/23/2017, 9:02 AM
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
## 2017-07-23 NOTE — Discharge Planning (Signed)
Patient alert and oriented X 4, denies SI, HI and AVH. Patient received discharge instructions and verbalized  Understanding. Patient received belongings from locker and voiced all items accounted. Patient received paper prescriptions.Patient escorted to visitor exit and D/C with taxi.

## 2017-07-23 NOTE — BHH Suicide Risk Assessment (Signed)
[facility] Discharge Suicide Risk Assessment   Principal Problem: Undifferentiated schizophrenia [facility]) Discharge Diagnoses:  Patient Active Problem List   Diagnosis Date Noted  . Undifferentiated schizophrenia ([place]) [F20.3] 07/03/2017    Priority: High  . Tardive dyskinesia [G24.01] 09/16/2014    Priority: Low  . HLD (hyperlipidemia) [E78.5] 02/07/2015  . COPD (chronic obstructive pulmonary disease) ([place]) [J44.9] 02/07/2015  . Cannabis use disorder, severe, dependence ([place]) [F12.20] 09/24/2014  . Hypothyroidism [E03.9] 09/17/2014  . GERD (gastroesophageal reflux disease) [K21.9] 09/17/2014  . Tobacco use disorder [F17.200] 09/17/2014  . Asthma [J45.909] 09/16/2014  . HTN (hypertension) [I10] 09/16/2014    Mental Status Per Nursing Assessment::   On Admission:     Demographic Factors:  Male, Caucasian and Unemployed  Loss Factors: Decrease in vocational status  Historical Factors: Impulsivity  Risk Reduction Factors:   Positive social support, Positive therapeutic relationship and Positive coping skills or problem solving skills  Continued Clinical Symptoms:  Medical Diagnoses and Treatments/Surgeries  Cognitive Features That Contribute To Risk:  None    Suicide Risk:  Minimal: No identifiable suicidal ideation.   Follow-up Information    [place].. Go on 07/23/2017.   Why:  Please go to [facility] on Monday, 07/23/17 at 11AM. Thank you! Contact information: [place] [address] [phone]           Plan Of Care/Follow-up recommendations: Follow up with ACT team  Marylin Crosby, MD 07/23/2017, 9:07 AM

## 2017-08-10 ENCOUNTER — Emergency Department
Admission: EM | Admit: 2017-08-10 | Discharge: 2017-08-11 | Disposition: A | Discharge status: Home / Self Care | Payer: Medicaid Other | Attending: Emergency Medicine | Admitting: Emergency Medicine

## 2017-08-10 ENCOUNTER — Other Ambulatory Visit: Payer: Self-pay

## 2017-08-10 ENCOUNTER — Emergency Department: Payer: Medicaid Other

## 2017-08-10 ENCOUNTER — Encounter: Payer: Self-pay | Admitting: Emergency Medicine

## 2017-08-10 DIAGNOSIS — F29 Unspecified psychosis not due to a substance or known physiological condition: Secondary | ICD-10-CM | POA: Diagnosis not present

## 2017-08-10 DIAGNOSIS — E039 Hypothyroidism, unspecified: Secondary | ICD-10-CM | POA: Insufficient documentation

## 2017-08-10 DIAGNOSIS — J45909 Unspecified asthma, uncomplicated: Secondary | ICD-10-CM | POA: Insufficient documentation

## 2017-08-10 DIAGNOSIS — F209 Schizophrenia, unspecified: Secondary | ICD-10-CM | POA: Diagnosis not present

## 2017-08-10 DIAGNOSIS — F17210 Nicotine dependence, cigarettes, uncomplicated: Secondary | ICD-10-CM | POA: Insufficient documentation

## 2017-08-10 DIAGNOSIS — F1220 Cannabis dependence, uncomplicated: Secondary | ICD-10-CM | POA: Diagnosis not present

## 2017-08-10 DIAGNOSIS — F319 Bipolar disorder, unspecified: Secondary | ICD-10-CM | POA: Insufficient documentation

## 2017-08-10 DIAGNOSIS — F203 Undifferentiated schizophrenia: Secondary | ICD-10-CM

## 2017-08-10 DIAGNOSIS — F22 Delusional disorders: Secondary | ICD-10-CM | POA: Diagnosis not present

## 2017-08-10 DIAGNOSIS — Z79899 Other long term (current) drug therapy: Secondary | ICD-10-CM | POA: Diagnosis not present

## 2017-08-10 DIAGNOSIS — Z9049 Acquired absence of other specified parts of digestive tract: Secondary | ICD-10-CM | POA: Diagnosis not present

## 2017-08-10 DIAGNOSIS — G8929 Other chronic pain: Secondary | ICD-10-CM | POA: Insufficient documentation

## 2017-08-10 DIAGNOSIS — I10 Essential (primary) hypertension: Secondary | ICD-10-CM | POA: Diagnosis not present

## 2017-08-10 DIAGNOSIS — R2241 Localized swelling, mass and lump, right lower limb: Secondary | ICD-10-CM | POA: Diagnosis present

## 2017-08-10 LAB — CBC WITH DIFFERENTIAL/PLATELET
Basophils Absolute: 0.1 10*3/uL (ref 0–0.1)
Basophils Relative: 1 %
Eosinophils Absolute: 0.4 10*3/uL (ref 0–0.7)
Eosinophils Relative: 3 %
HCT: 39.5 % — ABNORMAL LOW (ref 40.0–52.0)
HEMOGLOBIN: 13.5 g/dL (ref 13.0–18.0)
LYMPHS ABS: 1 10*3/uL (ref 1.0–3.6)
LYMPHS PCT: 6 %
MCH: 30.2 pg (ref 26.0–34.0)
MCHC: 34.2 g/dL (ref 32.0–36.0)
MCV: 88.1 fL (ref 80.0–100.0)
Monocytes Absolute: 0.7 10*3/uL (ref 0.2–1.0)
Monocytes Relative: 5 %
Neutro Abs: 13.4 10*3/uL — ABNORMAL HIGH (ref 1.4–6.5)
Neutrophils Relative %: 85 %
Platelets: 244 10*3/uL (ref 150–440)
RBC: 4.49 MIL/uL (ref 4.40–5.90)
RDW: 15.1 % — ABNORMAL HIGH (ref 11.5–14.5)
WBC: 15.6 10*3/uL — ABNORMAL HIGH (ref 3.8–10.6)

## 2017-08-10 LAB — URINE DRUG SCREEN, QUALITATIVE (ARMC ONLY)
AMPHETAMINES, UR SCREEN: NOT DETECTED
Barbiturates, Ur Screen: NOT DETECTED
Benzodiazepine, Ur Scrn: NOT DETECTED
Cannabinoid 50 Ng, Ur ~~LOC~~: POSITIVE — AB
Cocaine Metabolite,Ur ~~LOC~~: NOT DETECTED
MDMA (ECSTASY) UR SCREEN: NOT DETECTED
Methadone Scn, Ur: NOT DETECTED
Opiate, Ur Screen: NOT DETECTED
Phencyclidine (PCP) Ur S: NOT DETECTED
TRICYCLIC, UR SCREEN: POSITIVE — AB

## 2017-08-10 LAB — BASIC METABOLIC PANEL
Anion gap: 5 (ref 5–15)
BUN: 18 mg/dL (ref 6–20)
CHLORIDE: 103 mmol/L (ref 101–111)
CO2: 26 mmol/L (ref 22–32)
Calcium: 9 mg/dL (ref 8.9–10.3)
Creatinine, Ser: 0.93 mg/dL (ref 0.61–1.24)
GFR calc Af Amer: >60 mL/min (ref >60)
GFR calc non Af Amer: >60 mL/min (ref >60)
GLUCOSE: 108 mg/dL — AB (ref 65–99)
POTASSIUM: 4.1 mmol/L (ref 3.5–5.1)
Sodium: 134 mmol/L — ABNORMAL LOW (ref 135–145)

## 2017-08-10 LAB — CK: CK TOTAL: 347 U/L (ref 49–397)

## 2017-08-10 LAB — HEPATIC FUNCTION PANEL
ALK PHOS: 79 U/L (ref 38–126)
ALT: 20 U/L (ref 17–63)
AST: 24 U/L (ref 15–41)
Albumin: 4.2 g/dL (ref 3.5–5.0)
BILIRUBIN TOTAL: 0.3 mg/dL (ref 0.3–1.2)
Total Protein: 7.5 g/dL (ref 6.5–8.1)

## 2017-08-10 MED ORDER — HALOPERIDOL LACTATE 5 MG/ML IJ SOLN
10.0000 mg | Freq: Once | INTRAMUSCULAR | Status: AC
  Administered 2017-08-10: 10 mg via INTRAMUSCULAR
  Filled 2017-08-10: qty 2

## 2017-08-10 MED ORDER — DIPHENHYDRAMINE HCL 50 MG/ML IJ SOLN
50.0000 mg | Freq: Once | INTRAMUSCULAR | Status: AC
  Administered 2017-08-10: 50 mg via INTRAMUSCULAR
  Filled 2017-08-10: qty 1

## 2017-08-10 MED ORDER — DIPHENHYDRAMINE HCL 50 MG/ML IJ SOLN: 50.0000 mg | Freq: Once | INTRAMUSCULAR | Status: DC

## 2017-08-10 MED ORDER — LORAZEPAM 2 MG/ML IJ SOLN: 2.0000 mg | Freq: Once | INTRAMUSCULAR | Status: DC

## 2017-08-10 MED ORDER — LORAZEPAM 2 MG/ML IJ SOLN
2.0000 mg | Freq: Once | INTRAMUSCULAR | Status: AC
  Administered 2017-08-10: 2 mg via INTRAMUSCULAR

## 2017-08-10 MED ORDER — LORAZEPAM 2 MG/ML IJ SOLN
INTRAMUSCULAR | Status: AC
  Administered 2017-08-10: 2 mg via INTRAMUSCULAR
  Filled 2017-08-10: qty 1

## 2017-08-10 MED ORDER — HYDROCODONE-ACETAMINOPHEN 5-325 MG PO TABS
1.0000 | ORAL_TABLET | Freq: Once | ORAL | Status: AC
  Administered 2017-08-10: 1 via ORAL
  Filled 2017-08-10: qty 1

## 2017-08-10 MED ORDER — KETOROLAC TROMETHAMINE 30 MG/ML IJ SOLN
30.0000 mg | Freq: Once | INTRAMUSCULAR | Status: AC
  Administered 2017-08-10: 30 mg via INTRAMUSCULAR
  Filled 2017-08-10: qty 1

## 2017-08-10 NOTE — ED Notes (Signed)
Dressed out into Ambulance person by Edison Nasuti, ED tech. Patient agreeable and cooperative. Moved to behavioral quad to await further evaluation. Report to [place], [place]

## 2017-08-10 NOTE — ED Notes (Signed)
Pt resting quietly at this time with eyes closed, respirations equal and unlabored.  

## 2017-08-10 NOTE — ED Notes (Signed)
Pt agreeable to IM injections. Continues to complain about how is being treated unfairly and that we are just putting "a bandaid" on the problem. Continues to ramble about how hospitals are only about money and no one cares about him. Admits that he is very agitated. IM  Injections given voluntarily and without resistance. Edison Nasuti ED tech at bedside to watch.
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
## 2017-08-10 NOTE — ED Provider Notes (Signed)
[facility] Emergency Department Provider Note  ____________________________________________   First MD Initiated Contact with Patient 08/10/17 1514     (approximate)  I have reviewed the triage vital signs and the nursing notes.   HISTORY  Chief Complaint Knee Pain and Foot Pain  History is limited as the patient is paranoid and delusional  HPI Christopher Mason is a 57 y.o. male who comes to the emergency department via EMS with multiple complaints.  He says he is concerned that for the past 2 weeks he has had swelling in his right knee as well as in his right foot.  He also says he has pain "all over".  He has a past medical history of schizophrenia and is evasive when I asked him whether or not he is taking his medications.  He resides in a boarding home which he does not care for.  Past Medical History:  Diagnosis Date  . Acute kidney injury ([place]) 11/20/2014  . ARF (acute renal failure) ([place]) 11/06/2014  . Asthma   . Bipolar disorder ([place])   . COPD (chronic obstructive pulmonary disease) ([place])   . Depression   . Gastritis 02/07/2015  . GERD (gastroesophageal reflux disease)   . GI bleeding 09/05/2015  . HLD (hyperlipidemia)   . Hypertension   . Hypokalemia 11/06/2014  . Hyponatremia 02/07/2015  . Hypotension 11/06/2014  . Hypothyroid   . Irritable bowel syndrome (IBS)   . Non compliance w medication regimen 09/16/2014  . Schizophrenia [facility])     Patient Active Problem List   Diagnosis Date Noted  . 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 HLD (hyperlipidemia) 02/07/2015  . COPD (chronic obstructive pulmonary disease) ([place]) 02/07/2015  . Cannabis use disorder, severe, dependence ([place]) 09/24/2014  . Hypothyroidism 09/17/2014  . GERD (gastroesophageal reflux disease) 09/17/2014  . Tobacco use disorder 09/17/2014  . Asthma 09/16/2014  . HTN (hypertension) 09/16/2014  . Tardive dyskinesia 09/16/2014    Past Surgical History:    Procedure Laterality Date  . BACK SURGERY    . CARPAL TUNNEL RELEASE Bilateral   . CHOLECYSTECTOMY    . ESOPHAGOGASTRODUODENOSCOPY (EGD) WITH PROPOFOL N/A 09/06/2015   Procedure: ESOPHAGOGASTRODUODENOSCOPY (EGD) WITH PROPOFOL;  Surgeon: Hulen Luster, MD;  Location: [facility] ENDOSCOPY;  Service: Endoscopy;  Laterality: N/A;  . ROTATOR CUFF REPAIR     2007    Prior to Admission medications   Medication Sig Start Date End Date Taking? Authorizing Provider  albuterol (PROVENTIL HFA;VENTOLIN HFA) 108 (90 BASE) MCG/ACT inhaler Inhale 2 puffs into the lungs every 4 (four) hours as needed for wheezing or shortness of breath. 09/25/14   Pucilowska, Jolanta B, MD  amitriptyline (ELAVIL) 50 MG tablet Take 1 tablet (50 mg total) by mouth at bedtime. 07/23/17   McNew, Tyson Babinski, MD  amLODipine (NORVASC) 10 MG tablet Take 1 tablet (10 mg total) by mouth daily. 05/15/17   Ronnell Freshwater, NP  docusate sodium (COLACE) 100 MG capsule Take 1 capsule (100 mg total) by mouth daily. 06/13/17   Ronnell Freshwater, NP  levothyroxine (SYNTHROID, LEVOTHROID) 125 MCG tablet Take 1 tablet (125 mcg total) by mouth daily at 6 (six) AM. 07/24/17   McNew, Tyson Babinski, MD  lisinopril (PRINIVIL,ZESTRIL) 40 MG tablet Take 1 tablet (40 mg total) by mouth daily. 07/24/17   McNew, Tyson Babinski, MD  meloxicam (MOBIC) 15 MG tablet Take 1 tablet (15 mg total) by mouth daily. 12/24/16   Lavonia Drafts, MD  paliperidone (INVEGA SUSTENNA) 234 MG/1.5ML  SUSP injection Inject 234 mg into the muscle once for 1 dose. Next dose due 08/13/17 08/13/17 08/13/17  McNew, Tyson Babinski, MD  pantoprazole (PROTONIX) 40 MG tablet Take 1 tablet (40 mg total) by mouth daily. 07/10/17   Ronnell Freshwater, NP  pravastatin (PRAVACHOL) 10 MG tablet Take 1 tablet (10 mg total) by mouth daily. 05/15/17   Ronnell Freshwater, NP  propranolol (INDERAL) 10 MG tablet Take 1 tablet (10 mg total) by mouth 3 (three) times daily. 07/23/17   McNew, Tyson Babinski, MD  solifenacin (VESICARE) 10 MG tablet Take 1 tab  by mouth nightly for overactive bladder 05/15/17   Ronnell Freshwater, NP  sucralfate (CARAFATE) 1 g tablet Take 1 tablet (1 g total) by mouth 3 (three) times daily. 04/06/17   Ronnell Freshwater, NP  tamsulosin (FLOMAX) 0.4 MG CAPS capsule Take by mouth.    [provider]  venlafaxine XR (EFFEXOR-XR) 75 MG 24 hr capsule Take 1 capsule (75 mg total) by mouth daily with breakfast. 07/24/17   McNew, Tyson Babinski, MD    Allergies Aspirin  Family History  Problem Relation Age of Onset  . Hypertension Father   . Cataracts Father   . Diabetes Sister   . Diabetes Brother   . Dementia Mother     Social History Social History   Tobacco Use  . Smoking status: Current Every Day Smoker    Packs/day: 1.00    Types: Cigarettes  . Smokeless tobacco: Never Used  Substance Use Topics  . Alcohol use: No  . Drug use: Yes    Frequency: 1.0 times per week    Types: Marijuana    Comment: last smoked today    Review of Systems History is limited by the patient's psychiatric illness ____________________________________________   PHYSICAL EXAM:  VITAL SIGNS: ED Triage Vitals [08/10/17 1436]  Enc Vitals Group     BP (!) 151/92     Pulse Rate 85     Resp 16     Temp 98 F (36.7 C)     Temp Source Oral     SpO2 96 %     Weight 150 lb (68 kg)     Height 5\' 7"  (1.702 m)     Head Circumference      Peak Flow      Pain Score 9     Pain Loc      Pain Edu?      Excl. in [place]?     Constitutional: Appears paranoid and responding to internal stimuli gazing around the room and intermittently explosive Eyes: PERRL EOMI. midrange and brisk Head: Atraumatic. Nose: No congestion/rhinnorhea. Mouth/Throat: No trismus Neck: No stridor.   Cardiovascular: Normal rate, regular rhythm. Grossly normal heart sounds.  Good peripheral circulation. Respiratory: Normal respiratory effort.  No retractions. Lungs CTAB and moving good air Gastrointestinal: Soft nontender Musculoskeletal: Mild effusion to  left knee none to the right.  Extensor mechanism intact.  No bony tenderness.  Knees are stable.  Full range of motion without difficulty Neurologic:  . No gross focal neurologic deficits are appreciated. Skin:  Skin is warm, dry and intact. No rash noted. Psychiatric: Anxious, paranoid, psychotic    ____________________________________________   DIFFERENTIAL includes but not limited to  Schizophrenia, schizoaffective disorder, gout, inflammatory arthritis, fracture ____________________________________________   LABS (all labs ordered are listed, but only abnormal results are displayed)  Labs Reviewed  CBC WITH DIFFERENTIAL/PLATELET - Abnormal; Notable for the following components:  Result Value   WBC 15.6 (*)    HCT 39.5 (*)    RDW 15.1 (*)    Neutro Abs 13.4 (*)    All other components within normal limits  BASIC METABOLIC PANEL - Abnormal; Notable for the following components:   Sodium 134 (*)    Glucose, Bld 108 (*)    All other components within normal limits  HEPATIC FUNCTION PANEL - Abnormal; Notable for the following components:   Bilirubin, Direct <0.1 (*)    All other components within normal limits  URINE DRUG SCREEN, QUALITATIVE ([facility] ONLY) - Abnormal; Notable for the following components:   Tricyclic, Ur Screen POSITIVE (*)    Cannabinoid 50 Ng, Ur [place] POSITIVE (*)    All other components within normal limits  CK  ETHANOL    Lab work reviewed by me with elevated white count which is nonspecific __________________________________________  EKG   ____________________________________________  RADIOLOGY  X-ray of the right knee reviewed by me as no acute disease X-ray of the right foot reviewed by me with no acute disease ____________________________________________   PROCEDURES  Procedure(s) performed: no  .Critical Care Performed by: Darel Hong, MD Authorized by: Darel Hong, MD   Critical care provider statement:    Critical care  time (minutes):  30   Critical care time was exclusive of:  Separately billable procedures and treating other patients   Critical care was necessary to treat or prevent imminent or life-threatening deterioration of the following conditions:  Toxidrome   Critical care was time spent personally by me on the following activities:  Development of treatment plan with patient or surrogate, discussions with consultants, evaluation of patient's response to treatment, examination of patient, obtaining history from patient or surrogate, ordering and performing treatments and interventions, ordering and review of laboratory studies, ordering and review of radiographic studies, pulse oximetry, re-evaluation of patient's condition and review of old charts    Critical Care performed: Yes  Observation: no ____________________________________________   INITIAL IMPRESSION / ASSESSMENT AND PLAN / ED COURSE  Pertinent labs & imaging results that were available during my care of the patient were reviewed by me and considered in my medical decision making (see chart for details).  On arrival the patient reports roughly 2 weeks of worsening chronic pain.  X-rays are pending as well as pain control in the meantime.     ----------------------------------------- 3:45 PM on 08/10/2017 -----------------------------------------  The patient has become upset after having his vital signs rechecked.  He is upset because his blood pressure is not reading is elevated and he said that he checked his blood pressure at home and it was elevated and she thinks that her machine is deceptive and that we are lying.  He is expressing further paranoid delusions and demonstrating psychotic behaviors.  At this point I do believe the patient requires intramuscular haloperidol and Ativan for his own safety and once he is stabilized will require evaluation by a psychiatrist.  I have placed him under involuntary commitment at this  time. ____________________________________________  ----------------------------------------- 5:14 PM on 08/10/2017 -----------------------------------------  Dr. Weber Cooks saw and evaluated the patient before he fell asleep from his haloperidol.  He feels the patient will most likely be stable for discharge once he awakes.   FINAL CLINICAL IMPRESSION(S) / ED DIAGNOSES  Final diagnoses:  Schizophrenia, unspecified type ([place])  Paranoia ([place])  Other chronic pain  Psychosis, unspecified psychosis type ([place])      NEW MEDICATIONS STARTED DURING THIS VISIT:  New  Prescriptions   No medications on file     Note:  This document was prepared using Dragon voice recognition software and may include unintentional dictation errors.     Darel Hong, MD 08/10/17 1714

## 2017-08-10 NOTE — ED Notes (Addendum)
Pt yelling at staff. Cursing. States that we are lying about his blood pressure being normal. EDP observed as well.  Verbal de escalation attempted.

## 2017-08-10 NOTE — ED Notes (Signed)
Dr. Clapacs in room to talk to pt. 

## 2017-08-10 NOTE — ED Notes (Signed)
ED Provider at bedside to speak with patient. States that patient agrees to IM injections for agitation at this time.

## 2017-08-10 NOTE — ED Notes (Signed)
X-ray at bedside

## 2017-08-10 NOTE — ED Notes (Signed)
Meal tray placed next to patient, informed him that he had a tray next to him, pt woke up briefly and acknowledge meal and closed eyes.

## 2017-08-10 NOTE — ED Notes (Addendum)
Charge RN, Marya Amsler informed of plan for patient and patient will be dressed into hospital clothing and moved to quad.

## 2017-08-10 NOTE — ED Notes (Signed)
Pt resting with eyes closed, respirations equal and unlabored.

## 2017-08-10 NOTE — ED Notes (Signed)
Pt. Sleeping in room at this time.
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
## 2017-08-10 NOTE — ED Triage Notes (Signed)
Swellings problems in right knee and feet for a couple of weeks. Also c/o chills.

## 2017-08-11 NOTE — ED Notes (Signed)
Pt. Requested and was given drink and blanket.

## 2017-08-11 NOTE — ED Notes (Signed)
ED Is the patient under IVC or is there intent for IVC: Yes.   Is the patient medically cleared: Yes.   Is there vacancy in the ED BHU:  no   Is the population mix appropriate for patient:  Is the patient awaiting placement in inpatient or outpatient setting:  Has the patient had a psychiatric consult: Yes.   Reassessment pending  Survey of unit performed for contraband, proper placement and condition of furniture, tampering with fixtures in bathroom, shower, and each patient room: Yes.  ; Findings:  APPEARANCE/BEHAVIOR Calm and cooperative NEURO ASSESSMENT Orientation: oriented x3  Denies pain Hallucinations: No.None noted (Hallucinations) denies Speech: Normal Gait: normal RESPIRATORY ASSESSMENT Even  Unlabored respirations  CARDIOVASCULAR ASSESSMENT Pulses equal   regular rate  Skin warm and dry   GASTROINTESTINAL ASSESSMENT no GI complaint EXTREMITIES Full ROM  PLAN OF CARE Provide calm/safe environment. Vital signs assessed twice daily. ED BHU Assessment once each 12-hour shift. Collaborate with TTS daily or as condition indicates. Assure the ED provider has rounded once each shift. Provide and encourage hygiene. Provide redirection as needed. Assess for escalating behavior; address immediately and inform ED provider.  Assess family dynamic and appropriateness for visitation as needed: Yes.  ; If necessary, describe findings:  Educate the patient/family about BHU procedures/visitation: Yes.  ; If necessary, describe findings:

## 2017-08-11 NOTE — ED Notes (Signed)
DR MCSHANE RESCINDED IVC PAPERS/RN AMY T. AND ODS SECURITY MADE AWARE.

## 2017-08-11 NOTE — ED Provider Notes (Addendum)
-----------------------------------------   9:49 AM on 08/11/2017 -----------------------------------------  That he is medically cleared prior to my arrival, the plan for him is when he wakes up to see if he is in any distress and if not he is going to be okay for discharge.  He has been evaluated by psychiatry, last night, they feel he does not meet inpatient criteria and thus there is some change.  At this time he is awake alert, he has no SI or HI, and he wants to go home.  He does not have a history of violence towards self or others according to notes.  We will discharge him as he does contract for safety, return precautions follow-up given and understood.  I d/w dr. Weber Cooks again this am who agrees w d.c.   Schuyler Amor, MD 08/11/17 [id number]    Schuyler Amor, MD 08/11/17 [phone]

## 2017-08-11 NOTE — ED Notes (Signed)
BEHAVIORAL HEALTH ROUNDING Patient sleeping: No. Patient alert and oriented: yes Behavior appropriate: Yes.  ; If no, describe:  Nutrition and fluids offered: yes Toileting and hygiene offered: Yes  Sitter present: q15 minute observations and security monitoring Law enforcement present: Yes    

## 2017-08-11 NOTE — ED Notes (Signed)

## 2017-08-11 NOTE — ED Notes (Signed)
Pt can be heard from his room - I introduced myself to him  - IVC paperwork explained  - seen by psychiatry yesterday  - pt remained calm during assessment  Plan of care discussed    Verbalizes  "This is a mistake - I have been taking my medicine - I have it with me - You can count it to prove it" pt reassured  Denies SI/HI  Denies AH/VH   EDP updated

## 2017-08-13 ENCOUNTER — Other Ambulatory Visit: Payer: Self-pay

## 2017-08-13 MED ORDER — PROPRANOLOL HCL 10 MG PO TABS: 10.0000 mg | ORAL_TABLET | Freq: Three times a day (TID) | ORAL | 5 refills | Status: DC

## 2017-08-13 MED ORDER — LISINOPRIL 40 MG PO TABS: 40.0000 mg | ORAL_TABLET | Freq: Every day | ORAL | 5 refills | Status: DC

## 2017-08-14 ENCOUNTER — Other Ambulatory Visit: Payer: Self-pay

## 2017-08-14 ENCOUNTER — Other Ambulatory Visit: Payer: Self-pay | Admitting: Nurse Practitioner

## 2017-08-14 MED ORDER — SOLIFENACIN SUCCINATE 10 MG PO TABS: ORAL_TABLET | ORAL | 2 refills | Status: DC

## 2017-08-14 MED ORDER — LISINOPRIL 40 MG PO TABS: ORAL_TABLET | ORAL | 5 refills | Status: DC

## 2017-08-14 MED ORDER — PROPRANOLOL HCL 10 MG PO TABS: ORAL_TABLET | ORAL | 5 refills | Status: DC

## 2017-08-20 ENCOUNTER — Other Ambulatory Visit: Payer: Self-pay

## 2017-08-20 MED ORDER — PRAVASTATIN SODIUM 10 MG PO TABS: 10.0000 mg | ORAL_TABLET | Freq: Every day | ORAL | 6 refills | Status: DC

## 2017-08-20 MED ORDER — AMLODIPINE BESYLATE 10 MG PO TABS: 10.0000 mg | ORAL_TABLET | Freq: Every day | ORAL | 6 refills | Status: DC

## 2017-08-20 MED ORDER — LEVOTHYROXINE SODIUM 125 MCG PO TABS: 125.0000 ug | ORAL_TABLET | Freq: Every day | ORAL | 6 refills | Status: DC

## 2017-09-21 ENCOUNTER — Ambulatory Visit: Payer: Medicaid Other | Admitting: Nurse Practitioner

## 2017-09-21 ENCOUNTER — Encounter: Payer: Self-pay | Admitting: Nurse Practitioner

## 2017-09-21 VITALS — BP 118/79 | HR 81 | Resp 16 | Ht 67.75 in | Wt 166.4 lb

## 2017-09-21 DIAGNOSIS — K4091 Unilateral inguinal hernia, without obstruction or gangrene, recurrent: Secondary | ICD-10-CM | POA: Diagnosis not present

## 2017-09-21 DIAGNOSIS — M25561 Pain in right knee: Secondary | ICD-10-CM

## 2017-09-21 DIAGNOSIS — M25571 Pain in right ankle and joints of right foot: Secondary | ICD-10-CM | POA: Diagnosis not present

## 2017-09-21 DIAGNOSIS — F203 Undifferentiated schizophrenia: Secondary | ICD-10-CM

## 2017-09-21 DIAGNOSIS — E039 Hypothyroidism, unspecified: Secondary | ICD-10-CM | POA: Diagnosis not present

## 2017-09-21 DIAGNOSIS — R600 Localized edema: Secondary | ICD-10-CM | POA: Diagnosis not present

## 2017-09-21 MED ORDER — MELOXICAM 15 MG PO TABS: 15.0000 mg | ORAL_TABLET | Freq: Every day | ORAL | 1 refills | Status: DC
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
## 2017-10-10 DIAGNOSIS — M25571 Pain in right ankle and joints of right foot: Secondary | ICD-10-CM | POA: Insufficient documentation

## 2017-10-10 DIAGNOSIS — K4091 Unilateral inguinal hernia, without obstruction or gangrene, recurrent: Secondary | ICD-10-CM | POA: Insufficient documentation

## 2017-10-10 DIAGNOSIS — R600 Localized edema: Secondary | ICD-10-CM | POA: Insufficient documentation

## 2017-10-10 DIAGNOSIS — M25561 Pain in right knee: Secondary | ICD-10-CM | POA: Insufficient documentation

## 2017-10-10 DIAGNOSIS — G8929 Other chronic pain: Secondary | ICD-10-CM | POA: Insufficient documentation

## 2017-10-12 ENCOUNTER — Ambulatory Visit (INDEPENDENT_AMBULATORY_CARE_PROVIDER_SITE_OTHER): Payer: Medicaid Other

## 2017-10-12 DIAGNOSIS — K4091 Unilateral inguinal hernia, without obstruction or gangrene, recurrent: Secondary | ICD-10-CM | POA: Diagnosis not present

## 2017-10-19 ENCOUNTER — Ambulatory Visit: Payer: Medicaid Other

## 2017-10-19 DIAGNOSIS — R600 Localized edema: Secondary | ICD-10-CM | POA: Diagnosis not present

## 2017-10-26 ENCOUNTER — Other Ambulatory Visit: Payer: Self-pay

## 2017-10-30 ENCOUNTER — Encounter (INDEPENDENT_AMBULATORY_CARE_PROVIDER_SITE_OTHER): Payer: Self-pay | Admitting: Vascular Surgery

## 2017-10-30 ENCOUNTER — Ambulatory Visit (INDEPENDENT_AMBULATORY_CARE_PROVIDER_SITE_OTHER): Payer: Medicaid Other | Admitting: Vascular Surgery

## 2017-10-30 VITALS — BP 139/94 | HR 71 | Resp 16 | Ht 67.0 in | Wt 182.6 lb

## 2017-10-30 DIAGNOSIS — I82441 Acute embolism and thrombosis of right tibial vein: Secondary | ICD-10-CM | POA: Diagnosis not present

## 2017-10-30 DIAGNOSIS — R600 Localized edema: Secondary | ICD-10-CM
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
## 2017-10-31 ENCOUNTER — Other Ambulatory Visit: Payer: Self-pay

## 2017-10-31 DIAGNOSIS — R5383 Other fatigue: Secondary | ICD-10-CM

## 2017-10-31 DIAGNOSIS — N3000 Acute cystitis without hematuria: Secondary | ICD-10-CM | POA: Insufficient documentation

## 2017-10-31 DIAGNOSIS — R1032 Left lower quadrant pain: Secondary | ICD-10-CM | POA: Insufficient documentation

## 2017-10-31 DIAGNOSIS — F17210 Nicotine dependence, cigarettes, uncomplicated: Secondary | ICD-10-CM | POA: Insufficient documentation

## 2017-10-31 HISTORY — DX: Acute cystitis without hematuria: N30.00

## 2017-10-31 HISTORY — DX: Other fatigue: R53.83

## 2017-10-31 MED ORDER — SUCRALFATE 1 G PO TABS: 1.0000 g | ORAL_TABLET | Freq: Three times a day (TID) | ORAL | 3 refills | Status: DC

## 2017-10-31 MED ORDER — DOCUSATE SODIUM 100 MG PO CAPS: 100.0000 mg | ORAL_CAPSULE | Freq: Every day | ORAL | 3 refills | Status: DC

## 2017-11-01 ENCOUNTER — Other Ambulatory Visit: Payer: Self-pay | Admitting: Nurse Practitioner

## 2017-11-01 ENCOUNTER — Ambulatory Visit: Payer: Self-pay | Admitting: Nurse Practitioner

## 2017-11-01 MED ORDER — SOLIFENACIN SUCCINATE 10 MG PO TABS: ORAL_TABLET | ORAL | 2 refills | Status: DC

## 2017-11-19 ENCOUNTER — Telehealth: Payer: Self-pay | Admitting: Nurse Practitioner

## 2017-12-03 ENCOUNTER — Other Ambulatory Visit: Payer: Self-pay

## 2017-12-03 MED ORDER — TAMSULOSIN HCL 0.4 MG PO CAPS: ORAL_CAPSULE | ORAL | 2 refills | Status: DC

## 2017-12-26 ENCOUNTER — Ambulatory Visit (INDEPENDENT_AMBULATORY_CARE_PROVIDER_SITE_OTHER): Payer: Medicaid Other | Admitting: Nurse Practitioner

## 2017-12-26 ENCOUNTER — Encounter

## 2017-12-26 ENCOUNTER — Encounter (INDEPENDENT_AMBULATORY_CARE_PROVIDER_SITE_OTHER): Payer: Medicaid Other

## 2017-12-27 ENCOUNTER — Ambulatory Visit (INDEPENDENT_AMBULATORY_CARE_PROVIDER_SITE_OTHER): Payer: Medicaid Other | Admitting: Nurse Practitioner

## 2017-12-27 ENCOUNTER — Encounter (INDEPENDENT_AMBULATORY_CARE_PROVIDER_SITE_OTHER): Payer: Medicaid Other

## 2018-01-02 ENCOUNTER — Encounter (INDEPENDENT_AMBULATORY_CARE_PROVIDER_SITE_OTHER): Payer: Medicaid Other

## 2018-01-02 ENCOUNTER — Encounter (INDEPENDENT_AMBULATORY_CARE_PROVIDER_SITE_OTHER): Payer: Self-pay | Admitting: Nurse Practitioner

## 2018-01-02 ENCOUNTER — Ambulatory Visit: Payer: Self-pay | Admitting: Adult Health

## 2018-01-02 ENCOUNTER — Ambulatory Visit (INDEPENDENT_AMBULATORY_CARE_PROVIDER_SITE_OTHER): Payer: Medicaid Other

## 2018-01-02 ENCOUNTER — Ambulatory Visit (INDEPENDENT_AMBULATORY_CARE_PROVIDER_SITE_OTHER): Payer: Medicaid Other | Admitting: Nurse Practitioner

## 2018-01-02 VITALS — BP 121/85 | HR 61 | Resp 16 | Ht 67.0 in | Wt 178.0 lb

## 2018-01-02 DIAGNOSIS — R600 Localized edema: Secondary | ICD-10-CM

## 2018-01-02 DIAGNOSIS — E785 Hyperlipidemia, unspecified: Secondary | ICD-10-CM | POA: Diagnosis not present

## 2018-01-02 DIAGNOSIS — K219 Gastro-esophageal reflux disease without esophagitis: Secondary | ICD-10-CM

## 2018-01-02 DIAGNOSIS — I10 Essential (primary) hypertension: Secondary | ICD-10-CM | POA: Diagnosis not present
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
## 2018-01-07 ENCOUNTER — Ambulatory Visit (INDEPENDENT_AMBULATORY_CARE_PROVIDER_SITE_OTHER): Payer: Medicaid Other | Admitting: Adult Health

## 2018-01-07 ENCOUNTER — Encounter: Payer: Self-pay | Admitting: Adult Health

## 2018-01-07 VITALS — BP 138/98 | HR 83 | Temp 95.7°F | Resp 18 | Ht 67.0 in | Wt 179.0 lb

## 2018-01-07 DIAGNOSIS — R51 Headache: Secondary | ICD-10-CM | POA: Diagnosis not present

## 2018-01-07 DIAGNOSIS — Z720 Tobacco use: Secondary | ICD-10-CM | POA: Diagnosis not present

## 2018-01-07 DIAGNOSIS — Z1211 Encounter for screening for malignant neoplasm of colon: Secondary | ICD-10-CM

## 2018-01-07 DIAGNOSIS — I10 Essential (primary) hypertension: Secondary | ICD-10-CM

## 2018-01-07 DIAGNOSIS — Z23 Encounter for immunization: Secondary | ICD-10-CM | POA: Diagnosis not present

## 2018-01-07 DIAGNOSIS — K5900 Constipation, unspecified: Secondary | ICD-10-CM

## 2018-01-07 DIAGNOSIS — R519 Headache, unspecified: Secondary | ICD-10-CM

## 2018-01-07 MED ORDER — SENNA 8.6 MG PO TABS: 1.0000 | ORAL_TABLET | Freq: Every day | ORAL | 0 refills | Status: DC

## 2018-01-07 NOTE — Patient Instructions (Signed)

## 2018-01-07 NOTE — Progress Notes (Signed)
[facility], [place] [address]  Internal MEDICINE  Office Visit Note  Patient Name: Christopher Mason  [id number]  [id number]  Date of Service: 01/14/2018  Chief Complaint  Patient presents with  . Headache    FOR A MONTH   . Quality Metric Gaps    COLONOSCOPY    HPI Pt is here for a sick visit. He reports feeling bad last week and having cough, headaches, stomach ache with constipation.  He reports he had been coughing x 3 weeks. He reports his appetite has been good and he has been eating well. He does reports vomiting a couple weeks ago, and states that this happens intermittently.  He is still smoking 1 pack of cigarettes daily.  He denies fever, SOB or chest pain.  The headache has been intermittent, but daily for two weeks.  He denies balance problems, vision changes.  He reports dizziness and light headedness at times with the headache.      Current Medication:  Outpatient Encounter Medications as of 01/07/2018  Medication Sig  . albuterol (PROVENTIL HFA;VENTOLIN HFA) 108 (90 BASE) MCG/ACT inhaler Inhale 2 puffs into the lungs every 4 (four) hours as needed for wheezing or shortness of breath.  Marland Kitchen amitriptyline (ELAVIL) 50 MG tablet Take 1 tablet (50 mg total) by mouth at bedtime.  Marland Kitchen amLODipine (NORVASC) 10 MG tablet Take 1 tablet (10 mg total) by mouth daily.  Marland Kitchen levothyroxine (SYNTHROID, LEVOTHROID) 125 MCG tablet Take 1 tablet (125 mcg total) by mouth daily at 6 (six) AM.  . lisinopril (PRINIVIL,ZESTRIL) 40 MG tablet Take one tablet (40mg  total) by mouth daily  . pravastatin (PRAVACHOL) 10 MG tablet Take 1 tablet (10 mg total) by mouth daily.  . propranolol (INDERAL) 10 MG tablet Take one tablet (10mg  total) by mouth three times a day.  . traZODone (DESYREL) 100 MG tablet Take 100 mg by mouth at bedtime.  Marland Kitchen venlafaxine XR (EFFEXOR-XR) 75 MG 24 hr capsule Take 1 capsule (75 mg total) by mouth daily with breakfast.  . docusate sodium  (COLACE) 100 MG capsule Take 1 capsule (100 mg total) by mouth daily.  . paliperidone (INVEGA SUSTENNA) 234 MG/1.5ML SUSP injection Inject 234 mg into the muscle once for 1 dose. Next dose due 08/13/17  . pantoprazole (PROTONIX) 40 MG tablet Take 1 tablet (40 mg total) by mouth daily. (Patient not taking: Reported on 01/07/2018)  . senna (SENOKOT) 8.6 MG TABS tablet Take 1 tablet (8.6 mg total) by mouth daily.  . solifenacin (VESICARE) 10 MG tablet Take 1 tab by mouth nightly for overactive bladder (Patient not taking: Reported on 01/07/2018)  . sucralfate (CARAFATE) 1 g tablet Take 1 tablet (1 g total) by mouth 3 (three) times daily. (Patient not taking: Reported on 01/07/2018)  . tamsulosin (FLOMAX) 0.4 MG CAPS capsule Take one capsule by mouth every morning for BPH (Patient not taking: Reported on 01/07/2018)  . [DISCONTINUED] meloxicam (MOBIC) 15 MG tablet Take 1 tablet (15 mg total) by mouth daily. (Patient not taking: Reported on 10/30/2017)   No facility-administered encounter medications on file as of 01/07/2018.       Medical History: Past Medical History:  Diagnosis Date  . Acute kidney injury ([place]) 11/20/2014  . ARF (acute renal failure) ([place]) 11/06/2014  . Asthma   . Bipolar disorder ([place])   . COPD (chronic obstructive pulmonary disease) ([place])   . Depression   . Gastritis 02/07/2015  . GERD (gastroesophageal reflux disease)   .  GI bleeding 09/05/2015  . HLD (hyperlipidemia)   . Hypertension   . Hypokalemia 11/06/2014  . Hyponatremia 02/07/2015  . Hypotension 11/06/2014  . Hypothyroid   . Irritable bowel syndrome (IBS)   . Non compliance w medication regimen 09/16/2014  . Schizophrenia ([place])      Vital Signs: BP (!) 138/98   Pulse 83   Temp (!) 95.7 F (35.4 C)   Resp 18   Ht 5\' 7"  (1.702 m)   Wt 179 lb (81.2 kg)   SpO2 95%   BMI 28.04 kg/m    Review of Systems  Constitutional: Negative.  Negative for chills, fatigue and unexpected weight change.  HENT: Negative.   Negative for congestion, rhinorrhea, sneezing and sore throat.   Eyes: Negative for redness.  Respiratory: Negative.  Negative for cough, chest tightness and shortness of breath.   Cardiovascular: Negative.  Negative for chest pain and palpitations.  Gastrointestinal: Positive for constipation. Negative for abdominal pain, diarrhea, nausea and vomiting.  Endocrine: Negative.   Genitourinary: Negative.  Negative for dysuria and frequency.  Musculoskeletal: Negative.  Negative for arthralgias, back pain, joint swelling and neck pain.  Skin: Negative.  Negative for rash.  Allergic/Immunologic: Negative.   Neurological: Positive for dizziness, light-headedness and headaches. Negative for tremors, syncope and numbness.  Hematological: Negative for adenopathy. Does not bruise/bleed easily.  Psychiatric/Behavioral: Negative.  Negative for behavioral problems, sleep disturbance and suicidal ideas. The patient is not nervous/anxious.     Physical Exam  Constitutional: He is oriented to person, place, and time. He appears well-developed and well-nourished. No distress.  HENT:  Head: Normocephalic and atraumatic.  Mouth/Throat: Oropharynx is clear and moist. No oropharyngeal exudate.  Eyes: Pupils are equal, round, and reactive to light. EOM are normal.  Neck: Normal range of motion. Neck supple. No JVD present. No tracheal deviation present. No thyromegaly present.  Cardiovascular: Normal rate, regular rhythm and normal heart sounds. Exam reveals no gallop and no friction rub.  No murmur heard. Pulmonary/Chest: Effort normal and breath sounds normal. No respiratory distress. He has no wheezes. He has no rales. He exhibits no tenderness.  Abdominal: Soft. There is no tenderness. There is no guarding.  Musculoskeletal: Normal range of motion.  Lymphadenopathy:    He has no cervical adenopathy.  Neurological: He is alert and oriented to person, place, and time. No cranial nerve deficit.  Skin: Skin  is warm and dry. He is not diaphoretic.  Psychiatric: He has a normal mood and affect. His behavior is normal. Judgment and thought content normal.  Nursing note and vitals reviewed.   Assessment/Plan:  1. Chronic intractable headache, unspecified headache type Given patients history of blood clots in lower extremities, will get CT head to rule out clot, since patients headache has been going on for two weeks. - CT Head Wo Contrast; Future  2. Constipation, unspecified constipation type Take Senna as directed.  Continue to drink plenty of fluids. - senna (SENOKOT) 8.6 MG TABS tablet; Take 1 tablet (8.6 mg total) by mouth daily.  Dispense: 30 each; Refill: 0 pt will need GI consult  3. Flu vaccine need - Flu Vaccine MDCK QUAD PF  4. Hypertension, unspecified type Elevated today.  Pt did not take medications this morning.  Will follow at next visit.  5. Tobacco abuse Smoking cessation counseling: 1. Pt acknowledges the risks of long term smoking, she will try to quite smoking. 2. Options for different medications including nicotine products, chewing gum, patch etc, Wellbutrin and Chantix  is discussed 3. Goal and date of compete cessation is discussed 4. Total time spent in smoking cessation is 15 min.  6. Screening for colon cancer GI consult for constipation and need for screening  General Counseling: Deni verbalizes understanding of the findings of todays visit and agrees with plan of treatment. I have discussed any further diagnostic evaluation that may be needed or ordered today. We also reviewed his medications today. he has been encouraged to call the office with any questions or concerns that should arise related to todays visit.   Orders Placed This Encounter  Procedures  . CT Head Wo Contrast  . Flu Vaccine MDCK QUAD PF    Meds ordered this encounter  Medications  . senna (SENOKOT) 8.6 MG TABS tablet    Sig: Take 1 tablet (8.6 mg total) by mouth daily.    Dispense:   30 each    Refill:  0    Time spent: 25 Minutes  This patient was seen by Orson Gear AGNP-C in Collaboration with Dr Lavera Guise as a part of collaborative care agreement

## 2018-01-21 ENCOUNTER — Telehealth: Payer: Self-pay

## 2018-01-21 NOTE — Telephone Encounter (Signed)
error 

## 2018-01-21 NOTE — Telephone Encounter (Signed)
Do I need to send these?

## 2018-01-25 NOTE — Telephone Encounter (Signed)
Patient paperwork done.Christopher Mason

## 2018-01-28 ENCOUNTER — Encounter: Payer: Self-pay | Admitting: *Deleted

## 2018-01-28 ENCOUNTER — Ambulatory Visit
Admission: RE | Admit: 2018-01-28 | Discharge: 2018-01-28 | Disposition: A | Discharge status: Home / Self Care | Payer: Medicaid Other | Source: Ambulatory Visit | Attending: Adult Health | Admitting: Adult Health

## 2018-01-28 DIAGNOSIS — B9689 Other specified bacterial agents as the cause of diseases classified elsewhere: Secondary | ICD-10-CM | POA: Insufficient documentation

## 2018-01-28 DIAGNOSIS — R51 Headache: Secondary | ICD-10-CM | POA: Insufficient documentation

## 2018-01-28 DIAGNOSIS — G319 Degenerative disease of nervous system, unspecified: Secondary | ICD-10-CM | POA: Insufficient documentation

## 2018-01-28 DIAGNOSIS — J323 Chronic sphenoidal sinusitis: Secondary | ICD-10-CM | POA: Diagnosis not present

## 2018-01-28 DIAGNOSIS — J322 Chronic ethmoidal sinusitis: Secondary | ICD-10-CM | POA: Insufficient documentation

## 2018-01-28 DIAGNOSIS — G8929 Other chronic pain: Secondary | ICD-10-CM

## 2018-01-30 ENCOUNTER — Encounter (INDEPENDENT_AMBULATORY_CARE_PROVIDER_SITE_OTHER): Payer: Medicaid Other

## 2018-01-30 ENCOUNTER — Ambulatory Visit (INDEPENDENT_AMBULATORY_CARE_PROVIDER_SITE_OTHER): Payer: Medicaid Other | Admitting: Nurse Practitioner

## 2018-02-01 ENCOUNTER — Other Ambulatory Visit: Payer: Self-pay

## 2018-02-01 MED ORDER — PANTOPRAZOLE SODIUM 40 MG PO TBEC: 40.0000 mg | DELAYED_RELEASE_TABLET | Freq: Every day | ORAL | 0 refills | Status: DC

## 2018-02-01 MED ORDER — SOLIFENACIN SUCCINATE 10 MG PO TABS: ORAL_TABLET | ORAL | 0 refills | Status: DC

## 2018-02-04 ENCOUNTER — Telehealth: Payer: Self-pay

## 2018-02-04 NOTE — Telephone Encounter (Signed)
Sent the RX for the next 30 days pt has an appointment coming up

## 2018-02-08 ENCOUNTER — Encounter: Payer: Self-pay | Admitting: Adult Health

## 2018-02-21 ENCOUNTER — Encounter: Payer: Self-pay | Admitting: Adult Health

## 2018-02-21 ENCOUNTER — Ambulatory Visit: Payer: Medicaid Other | Admitting: Adult Health

## 2018-02-21 VITALS — BP 130/88 | HR 92 | Resp 16 | Ht 67.0 in | Wt 183.0 lb

## 2018-02-21 DIAGNOSIS — E785 Hyperlipidemia, unspecified: Secondary | ICD-10-CM | POA: Diagnosis not present

## 2018-02-21 DIAGNOSIS — F203 Undifferentiated schizophrenia: Secondary | ICD-10-CM

## 2018-02-21 DIAGNOSIS — R300 Dysuria: Secondary | ICD-10-CM

## 2018-02-21 DIAGNOSIS — M545 Low back pain: Secondary | ICD-10-CM

## 2018-02-21 DIAGNOSIS — J439 Emphysema, unspecified: Secondary | ICD-10-CM

## 2018-02-21 DIAGNOSIS — F17219 Nicotine dependence, cigarettes, with unspecified nicotine-induced disorders: Secondary | ICD-10-CM

## 2018-02-21 DIAGNOSIS — E039 Hypothyroidism, unspecified: Secondary | ICD-10-CM | POA: Diagnosis not present

## 2018-02-21 DIAGNOSIS — Z0001 Encounter for general adult medical examination with abnormal findings: Secondary | ICD-10-CM

## 2018-02-21 DIAGNOSIS — G8929 Other chronic pain: Secondary | ICD-10-CM

## 2018-02-21 DIAGNOSIS — I10 Essential (primary) hypertension: Secondary | ICD-10-CM | POA: Diagnosis not present

## 2018-02-21 DIAGNOSIS — Z125 Encounter for screening for malignant neoplasm of prostate: Secondary | ICD-10-CM

## 2018-02-21 DIAGNOSIS — F1220 Cannabis dependence, uncomplicated: Secondary | ICD-10-CM | POA: Diagnosis not present

## 2018-02-21 DIAGNOSIS — I82441 Acute embolism and thrombosis of right tibial vein: Secondary | ICD-10-CM

## 2018-02-21 NOTE — Progress Notes (Signed)
[facility] [place],  [id number]  Internal MEDICINE  Office Visit Note  Patient Name: Christopher Mason  [id number]  [id number]  Date of Service: 02/21/2018  Chief Complaint  Patient presents with  . Annual Exam  . Hypertension  . Hyperlipidemia  . Back Pain    WHEN LAYING DOWN AT NIGHT  . Quality Metric Gaps    colonoscopy      HPI Pt is here for routine health maintenance examination.  Patient is a well-appearing 57 year old male.  He is here today for his annual physical.  He is currently being treated for hypertension, hyperlipidemia, chronic back pain, COPD, and schizophrenia.  His hypertension is currently well controlled.  He is hyperlipidemia has not been evaluated recently, will get new lab work today.  He is complaining today of back pain especially when he lays down at night.  He reports a long history of multiple surgeries to his lower back after an accident at work.  He is also in need of a colonoscopy to close his quality metric caps.  We will discuss this at this visit.  He reports tobacco use, at approximately half a pack of cigarettes per day.  He reports that he smokes marijuana approximately twice a week.  He denies drinking any alcohol.  Current Medication: Outpatient Encounter Medications as of 02/21/2018  Medication Sig  . albuterol (PROVENTIL HFA;VENTOLIN HFA) 108 (90 BASE) MCG/ACT inhaler Inhale 2 puffs into the lungs every 4 (four) hours as needed for wheezing or shortness of breath.  Marland Kitchen amitriptyline (ELAVIL) 50 MG tablet Take 1 tablet (50 mg total) by mouth at bedtime.  Marland Kitchen amLODipine (NORVASC) 10 MG tablet Take 1 tablet (10 mg total) by mouth daily.  Marland Kitchen docusate sodium (COLACE) 100 MG capsule Take 1 capsule (100 mg total) by mouth daily.  Marland Kitchen levothyroxine (SYNTHROID, LEVOTHROID) 125 MCG tablet Take 1 tablet (125 mcg total) by mouth daily at 6 (six) AM.  . lisinopril (PRINIVIL,ZESTRIL) 40 MG tablet Take one tablet (40mg  total)  by mouth daily  . pantoprazole (PROTONIX) 40 MG tablet Take 1 tablet (40 mg total) by mouth daily.  . pravastatin (PRAVACHOL) 10 MG tablet Take 1 tablet (10 mg total) by mouth daily.  . propranolol (INDERAL) 10 MG tablet Take one tablet (10mg  total) by mouth three times a day.  . senna (SENOKOT) 8.6 MG TABS tablet Take 1 tablet (8.6 mg total) by mouth daily.  . solifenacin (VESICARE) 10 MG tablet Take 1 tab by mouth nightly for overactive bladder  . traZODone (DESYREL) 100 MG tablet Take 100 mg by mouth at bedtime.  Marland Kitchen venlafaxine XR (EFFEXOR-XR) 75 MG 24 hr capsule Take 1 capsule (75 mg total) by mouth daily with breakfast.  . paliperidone (INVEGA SUSTENNA) 234 MG/1.5ML SUSP injection Inject 234 mg into the muscle once for 1 dose. Next dose due 08/13/17  . [DISCONTINUED] sucralfate (CARAFATE) 1 g tablet Take 1 tablet (1 g total) by mouth 3 (three) times daily. (Patient not taking: Reported on 01/07/2018)  . [DISCONTINUED] tamsulosin (FLOMAX) 0.4 MG CAPS capsule Take one capsule by mouth every morning for BPH (Patient not taking: Reported on 01/07/2018)   No facility-administered encounter medications on file as of 02/21/2018.     Surgical History: Past Surgical History:  Procedure Laterality Date  . BACK SURGERY    . CARPAL TUNNEL RELEASE Bilateral   . CHOLECYSTECTOMY    . ESOPHAGOGASTRODUODENOSCOPY (EGD) WITH PROPOFOL N/A 09/06/2015   Procedure: ESOPHAGOGASTRODUODENOSCOPY (EGD) WITH PROPOFOL;  Surgeon: Hulen Luster, MD;  [facility] ENDOSCOPY;  Service: Endoscopy;  Laterality: N/A;  . ROTATOR CUFF REPAIR     2007    Medical History: Past Medical History:  Diagnosis Date  . Acute kidney injury ([place]) 11/20/2014  . ARF (acute renal failure) ([place]) 11/06/2014  . Asthma   . Bipolar disorder ([place])   . COPD (chronic obstructive pulmonary disease) ([place])   . Depression   . Gastritis 02/07/2015  . GERD (gastroesophageal reflux disease)   . GI bleeding 09/05/2015  . HLD (hyperlipidemia)   .  Hypertension   . Hypokalemia 11/06/2014  . Hyponatremia 02/07/2015  . Hypotension 11/06/2014  . Hypothyroid   . Irritable bowel syndrome (IBS)   . Non compliance w medication regimen 09/16/2014  . Schizophrenia ([place])     Family History: Family History  Problem Relation Age of Onset  . Hypertension Father   . Cataracts Father   . Diabetes Sister   . Diabetes Brother   . Dementia Mother       Review of Systems  Constitutional: Negative.  Negative for chills, fatigue and unexpected weight change.  HENT: Negative.  Negative for congestion, rhinorrhea, sneezing and sore throat.   Eyes: Negative for redness.  Respiratory: Negative.  Negative for cough, chest tightness and shortness of breath.   Cardiovascular: Negative.  Negative for chest pain and palpitations.  Gastrointestinal: Negative.  Negative for abdominal pain, constipation, diarrhea, nausea and vomiting.  Endocrine: Negative.   Genitourinary: Negative.  Negative for dysuria and frequency.  Musculoskeletal: Positive for back pain. Negative for arthralgias, joint swelling and neck pain.  Skin: Negative.  Negative for rash.  Allergic/Immunologic: Negative.   Neurological: Negative.  Negative for tremors and numbness.  Hematological: Negative for adenopathy. Does not bruise/bleed easily.  Psychiatric/Behavioral: Negative.  Negative for behavioral problems, sleep disturbance and suicidal ideas. The patient is not nervous/anxious.      Vital Signs: BP 130/88 (BP Location: Left Arm, Patient Position: Sitting, Cuff Size: Small)   Pulse 92   Resp 16   Ht 5\' 7"  (1.702 m)   Wt 183 lb (83 kg)   SpO2 96%   BMI 28.66 kg/m    Physical Exam  Constitutional: He is oriented to person, place, and time. He appears well-developed and well-nourished. No distress.  HENT:  Head: Normocephalic and atraumatic.  Mouth/Throat: Oropharynx is clear and moist. No oropharyngeal exudate.  Eyes: Pupils are equal, round, and reactive to light.  EOM are normal.  Neck: Normal range of motion. Neck supple. No JVD present. No tracheal deviation present. No thyromegaly present.  Cardiovascular: Normal rate, regular rhythm and normal heart sounds. Exam reveals no gallop and no friction rub.  No murmur heard. Pulmonary/Chest: Effort normal and breath sounds normal. No respiratory distress. He has no wheezes. He has no rales. He exhibits no tenderness.  Abdominal: Soft. There is no tenderness. There is no guarding.  Musculoskeletal: Normal range of motion.  Negative straight leg raise bilaterally.  Lymphadenopathy:    He has no cervical adenopathy.  Neurological: He is alert and oriented to person, place, and time. No cranial nerve deficit.  Skin: Skin is warm and dry. He is not diaphoretic.  Psychiatric: He has a normal mood and affect. His behavior is normal. Judgment and thought content normal.  Nursing note and vitals reviewed.    LABS: No results found for this or any previous visit (from the past 2160 hour(s)).   Assessment/Plan: 1. Encounter for general adult medical examination  with abnormal findings Patient is up-to-date on preventative health maintenance except for colonoscopy.  Had an in-depth discussion about colonoscopy, he reports he had one over 10 years ago is not interested in having another one at this time.  We agreed to postpone his colonoscopy for 1 year and discuss this at his next physical. - CBC with Differential/Platelet - Comprehensive metabolic panel - Lipid Panel With LDL/HDL Ratio - T4, free - TSH  2. Hypertension, unspecified type Patient's blood pressure well controlled at this time.  Continue current medications as directed.  3. Acquired hypothyroidism No recent thyroid labs available.  Ordered today.  Patient currently on Synthroid 125 mcg daily, will evaluate lab results and titrate medication as appropriate.  4. Hyperlipidemia, unspecified hyperlipidemia type Patient also in need of lipid  panel which is ordered today.  He is currently taking pravastatin, he continue that at this time.  5. Deep vein thrombosis (DVT) of tibial vein of right lower extremity, unspecified chronicity ([place]) Patient sees vascular surgery for follow-up on DVT of the right leg.  He denies recent complications, and has an appointment to follow-up with vascular in the next 2 weeks.  6. Cigarette nicotine dependence with nicotine-induced disorder Smoking cessation counseling: 1. Pt acknowledges the risks of long term smoking, she will try to quite smoking. 2. Options for different medications including nicotine products, chewing gum, patch etc, Wellbutrin and Chantix is discussed 3. Goal and date of compete cessation is discussed 4. Total time spent in smoking cessation is 15 min.  7. Cannabis use disorder, severe, dependence ([place]) Patient reports twice a week use of marijuana.  He denies any other records or drug use.   8. Undifferentiated schizophrenia ([place]) Patient is pleasant and cooperative.  His schizophrenia appears well controlled on current medication regimen.  He sees psych regularly for follow-up, and medication refill.  9. Pulmonary emphysema, unspecified emphysema type ([place]) Once again encouraged patient to stop smoking.  However he reports his breathing has been doing well, he has not been hospitalized, and does not use any inhalers.  10. Chronic left-sided low back pain without sciatica Patient has chronic lower back pain.  He reports many years ago he had an injury to his back while working.  He states he has had 8 surgeries.  Apparently he had multiple infections after the surgery and had wound vacs and multiple debridements etc.  11. Screening for prostate cancer - PSA  12. Dysuria - UA/M w/rflx Culture, Routine  General Counseling: Unnamed verbalizes understanding of the findings of todays visit and agrees with plan of treatment. I have discussed any further diagnostic evaluation  that may be needed or ordered today. We also reviewed his medications today. he has been encouraged to call the office with any questions or concerns that should arise related to todays visit.   Orders Placed This Encounter  Procedures  . UA/M w/rflx Culture, Routine  . CBC with Differential/Platelet  . Comprehensive metabolic panel  . Lipid Panel With LDL/HDL Ratio  . PSA  . T4, free  . TSH    No orders of the defined types were placed in this encounter.   Time spent: 40 Minutes   This patient was seen by Orson Gear AGNP-C in Collaboration with Dr Lavera Guise as a part of collaborative care agreement    Kendell Bane AGNP-C Internal Medicine

## 2018-02-21 NOTE — Patient Instructions (Signed)

## 2018-02-22 LAB — CBC WITH DIFFERENTIAL/PLATELET
BASOS ABS: 0.1 10*3/uL (ref 0.0–0.2)
Basos: 1 %
EOS (ABSOLUTE): 0.4 10*3/uL (ref 0.0–0.4)
Eos: 4 %
Hematocrit: 42.1 % (ref 37.5–51.0)
Hemoglobin: 13.8 g/dL (ref 13.0–17.7)
Immature Grans (Abs): 0 10*3/uL (ref 0.0–0.1)
Immature Granulocytes: 0 %
LYMPHS ABS: 1.6 10*3/uL (ref 0.7–3.1)
LYMPHS: 16 %
MCH: 30.5 pg (ref 26.6–33.0)
MCHC: 32.8 g/dL (ref 31.5–35.7)
MCV: 93 fL (ref 79–97)
Monocytes Absolute: 0.7 10*3/uL (ref 0.1–0.9)
Monocytes: 7 %
NEUTROS ABS: 7.2 10*3/uL — AB (ref 1.4–7.0)
Neutrophils: 72 %
PLATELETS: 237 10*3/uL (ref 150–450)
RBC: 4.52 x10E6/uL (ref 4.14–5.80)
RDW: 14 % (ref 12.3–15.4)
WBC: 9.9 10*3/uL (ref 3.4–10.8)

## 2018-02-22 LAB — T4, FREE: FREE T4: 0.72 ng/dL — AB (ref 0.82–1.77)

## 2018-02-22 LAB — MICROSCOPIC EXAMINATION
Bacteria, UA: NONE SEEN
Casts: NONE SEEN /lpf
EPITHELIAL CELLS (NON RENAL): NONE SEEN /HPF (ref 0–10)

## 2018-02-22 LAB — UA/M W/RFLX CULTURE, ROUTINE
Bilirubin, UA: NEGATIVE
Glucose, UA: NEGATIVE
KETONES UA: NEGATIVE
LEUKOCYTES UA: NEGATIVE
NITRITE UA: NEGATIVE
PH UA: 6.5 (ref 5.0–7.5)
PROTEIN UA: NEGATIVE
RBC, UA: NEGATIVE
Specific Gravity, UA: 1.013 (ref 1.005–1.030)
UUROB: 0.2 mg/dL (ref 0.2–1.0)

## 2018-02-22 LAB — COMPREHENSIVE METABOLIC PANEL
ALK PHOS: 100 IU/L (ref 39–117)
ALT: 11 IU/L (ref 0–44)
AST: 15 IU/L (ref 0–40)
Albumin/Globulin Ratio: 1.5 (ref 1.2–2.2)
Albumin: 4.4 g/dL (ref 3.5–5.5)
BUN / CREAT RATIO: 15 (ref 9–20)
BUN: 15 mg/dL (ref 6–24)
CHLORIDE: 103 mmol/L (ref 96–106)
CO2: 21 mmol/L (ref 20–29)
Calcium: 9 mg/dL (ref 8.7–10.2)
Creatinine, Ser: 1.03 mg/dL (ref 0.76–1.27)
GFR calc Af Amer: 92 mL/min/{1.73_m2} (ref >59)
GFR calc non Af Amer: 80 mL/min/{1.73_m2} (ref >59)
Globulin, Total: 2.9 g/dL (ref 1.5–4.5)
Glucose: 92 mg/dL (ref 65–99)
Potassium: 4 mmol/L (ref 3.5–5.2)
Sodium: 141 mmol/L (ref 134–144)
Total Protein: 7.3 g/dL (ref 6.0–8.5)

## 2018-02-22 LAB — LIPID PANEL WITH LDL/HDL RATIO
CHOLESTEROL TOTAL: 196 mg/dL (ref 100–199)
HDL: 36 mg/dL — AB (ref >39)
LDL Calculated: 112 mg/dL — ABNORMAL HIGH (ref 0–99)
LDl/HDL Ratio: 3.1 ratio (ref 0.0–3.6)
TRIGLYCERIDES: 241 mg/dL — AB (ref 0–149)
VLDL Cholesterol Cal: 48 mg/dL — ABNORMAL HIGH (ref 5–40)

## 2018-02-22 LAB — PSA: Prostate Specific Ag, Serum: 1 ng/mL (ref 0.0–4.0)

## 2018-02-22 LAB — TSH: TSH: 137.4 u[IU]/mL — ABNORMAL HIGH (ref 0.450–4.500)

## 2018-02-28 ENCOUNTER — Other Ambulatory Visit: Payer: Self-pay | Admitting: Adult Health

## 2018-02-28 ENCOUNTER — Other Ambulatory Visit: Payer: Self-pay

## 2018-02-28 ENCOUNTER — Telehealth: Payer: Self-pay

## 2018-02-28 MED ORDER — PANTOPRAZOLE SODIUM 40 MG PO TBEC: 40.0000 mg | DELAYED_RELEASE_TABLET | Freq: Every day | ORAL | 5 refills | Status: DC

## 2018-02-28 MED ORDER — LEVOTHYROXINE SODIUM 175 MCG PO TABS: 175.0000 ug | ORAL_TABLET | Freq: Every day | ORAL | 3 refills | Status: DC

## 2018-02-28 NOTE — Telephone Encounter (Signed)
Called pt to inform him of his results and also informed the pt that he has a new RX at his pharmacy per Quita Skye, he will have levels rechecked in 2 months and will be given paperwork at next visit in December per Adam.

## 2018-02-28 NOTE — Progress Notes (Signed)
Sent RX for increased dose of synthroid to 168mcg.

## 2018-02-28 NOTE — Telephone Encounter (Signed)
-----   Message from Kendell Bane, NP sent at 02/28/2018 10:27 AM EST ----- His thyroid levels are severely increased. I have sent a RX for 148mcg Synthroid.  He needs a recheck in 02 months.

## 2018-03-01 ENCOUNTER — Other Ambulatory Visit: Payer: Self-pay

## 2018-03-01 MED ORDER — DOCUSATE SODIUM 100 MG PO CAPS: 100.0000 mg | ORAL_CAPSULE | Freq: Every day | ORAL | 3 refills | Status: DC

## 2018-03-01 MED ORDER — SOLIFENACIN SUCCINATE 10 MG PO TABS: ORAL_TABLET | ORAL | 0 refills | Status: DC

## 2018-03-01 MED ORDER — LISINOPRIL 40 MG PO TABS: ORAL_TABLET | ORAL | 5 refills | Status: DC

## 2018-03-01 MED ORDER — PROPRANOLOL HCL 10 MG PO TABS: ORAL_TABLET | ORAL | 5 refills | Status: DC

## 2018-03-04 ENCOUNTER — Other Ambulatory Visit: Payer: Self-pay

## 2018-03-29 ENCOUNTER — Other Ambulatory Visit: Payer: Self-pay

## 2018-03-29 MED ORDER — PRAVASTATIN SODIUM 10 MG PO TABS: 10.0000 mg | ORAL_TABLET | Freq: Every day | ORAL | 6 refills | Status: DC

## 2018-03-29 MED ORDER — AMLODIPINE BESYLATE 10 MG PO TABS: 10.0000 mg | ORAL_TABLET | Freq: Every day | ORAL | 6 refills | Status: DC

## 2018-04-01 ENCOUNTER — Ambulatory Visit (INDEPENDENT_AMBULATORY_CARE_PROVIDER_SITE_OTHER): Payer: Medicaid Other | Admitting: Adult Health

## 2018-04-01 ENCOUNTER — Telehealth: Payer: Self-pay | Admitting: Adult Health

## 2018-04-01 ENCOUNTER — Encounter: Payer: Self-pay | Admitting: Adult Health

## 2018-04-01 ENCOUNTER — Ambulatory Visit: Payer: Self-pay | Admitting: Nurse Practitioner

## 2018-04-01 VITALS — BP 108/72 | HR 78 | Resp 18 | Ht 67.0 in | Wt 187.2 lb

## 2018-04-01 DIAGNOSIS — R51 Headache: Secondary | ICD-10-CM

## 2018-04-01 DIAGNOSIS — F17219 Nicotine dependence, cigarettes, with unspecified nicotine-induced disorders: Secondary | ICD-10-CM

## 2018-04-01 DIAGNOSIS — R300 Dysuria: Secondary | ICD-10-CM

## 2018-04-01 DIAGNOSIS — E039 Hypothyroidism, unspecified: Secondary | ICD-10-CM

## 2018-04-01 DIAGNOSIS — E785 Hyperlipidemia, unspecified: Secondary | ICD-10-CM

## 2018-04-01 DIAGNOSIS — J439 Emphysema, unspecified: Secondary | ICD-10-CM

## 2018-04-01 DIAGNOSIS — M545 Low back pain: Secondary | ICD-10-CM | POA: Diagnosis not present

## 2018-04-01 DIAGNOSIS — G8929 Other chronic pain: Secondary | ICD-10-CM

## 2018-04-01 DIAGNOSIS — R519 Headache, unspecified: Secondary | ICD-10-CM

## 2018-04-01 DIAGNOSIS — F203 Undifferentiated schizophrenia: Secondary | ICD-10-CM

## 2018-04-01 DIAGNOSIS — I10 Essential (primary) hypertension: Secondary | ICD-10-CM

## 2018-04-01 MED ORDER — LEVOTHYROXINE SODIUM 200 MCG PO TABS: 200.0000 ug | ORAL_TABLET | Freq: Every day | ORAL | 2 refills | Status: DC

## 2018-04-01 NOTE — Telephone Encounter (Signed)
-----   Message from Kendell Bane, NP sent at 04/01/2018  1:21 PM EST ----- Regarding: blood Would you please call him and tell him I want to repeat his thyroid blood work.  I have ordered it.  HE should go get it done in 4-6 weeks after starting his new dose of levothyroxine I started today.
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
## 2018-04-01 NOTE — Telephone Encounter (Signed)
Patient notified of completing labs in 4-6 weeks

## 2018-04-01 NOTE — Patient Instructions (Signed)

## 2018-04-09 ENCOUNTER — Ambulatory Visit (INDEPENDENT_AMBULATORY_CARE_PROVIDER_SITE_OTHER): Payer: Medicaid Other | Admitting: Nurse Practitioner

## 2018-04-09 ENCOUNTER — Encounter (INDEPENDENT_AMBULATORY_CARE_PROVIDER_SITE_OTHER): Payer: Medicaid Other

## 2018-05-07 ENCOUNTER — Ambulatory Visit (INDEPENDENT_AMBULATORY_CARE_PROVIDER_SITE_OTHER): Payer: Medicaid Other | Admitting: Nurse Practitioner

## 2018-05-07 ENCOUNTER — Encounter (INDEPENDENT_AMBULATORY_CARE_PROVIDER_SITE_OTHER): Payer: Self-pay | Admitting: Nurse Practitioner

## 2018-05-07 ENCOUNTER — Other Ambulatory Visit (INDEPENDENT_AMBULATORY_CARE_PROVIDER_SITE_OTHER): Payer: Medicaid Other

## 2018-05-07 ENCOUNTER — Encounter (INDEPENDENT_AMBULATORY_CARE_PROVIDER_SITE_OTHER): Payer: Medicaid Other

## 2018-05-07 VITALS — BP 110/74 | HR 94 | Resp 16 | Ht 67.0 in | Wt 184.2 lb

## 2018-05-07 DIAGNOSIS — R600 Localized edema: Secondary | ICD-10-CM | POA: Diagnosis not present

## 2018-05-07 DIAGNOSIS — E785 Hyperlipidemia, unspecified: Secondary | ICD-10-CM

## 2018-05-07 DIAGNOSIS — I82441 Acute embolism and thrombosis of right tibial vein: Secondary | ICD-10-CM
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
## 2018-05-14 ENCOUNTER — Encounter (INDEPENDENT_AMBULATORY_CARE_PROVIDER_SITE_OTHER): Payer: Medicaid Other

## 2018-05-14 ENCOUNTER — Ambulatory Visit (INDEPENDENT_AMBULATORY_CARE_PROVIDER_SITE_OTHER): Payer: Medicaid Other | Admitting: Vascular Surgery

## 2018-05-27 ENCOUNTER — Encounter: Payer: Self-pay | Admitting: Adult Health

## 2018-05-27 ENCOUNTER — Ambulatory Visit: Payer: Medicaid Other | Admitting: Adult Health

## 2018-05-27 VITALS — BP 130/80 | HR 89 | Resp 16 | Ht 67.0 in | Wt 186.0 lb

## 2018-05-27 DIAGNOSIS — E039 Hypothyroidism, unspecified: Secondary | ICD-10-CM | POA: Diagnosis not present

## 2018-05-27 DIAGNOSIS — N529 Male erectile dysfunction, unspecified: Secondary | ICD-10-CM | POA: Diagnosis not present

## 2018-05-27 DIAGNOSIS — I10 Essential (primary) hypertension: Secondary | ICD-10-CM | POA: Diagnosis not present

## 2018-05-27 NOTE — Patient Instructions (Signed)
Erectile Dysfunction  Erectile dysfunction (ED) is the inability to get or keep an erection in order to have sexual intercourse. Erectile dysfunction may include:   Inability to get an erection.   Lack of enough hardness of the erection to allow penetration.   Loss of the erection before sex is finished.  What are the causes?  This condition may be caused by:   Certain medicines, such as:  ? Pain relievers.  ? Antihistamines.  ? Antidepressants.  ? Blood pressure medicines.  ? Water pills (diuretics).  ? Ulcer medicines.  ? Muscle relaxants.  ? Drugs.   Excessive drinking.   Psychological causes, such as:  ? Anxiety.  ? Depression.  ? Sadness.  ? Exhaustion.  ? Performance fear.  ? Stress.   Physical causes, such as:  ? Artery problems. This may include diabetes, smoking, liver disease, or atherosclerosis.  ? High blood pressure.  ? Hormonal problems, such as low testosterone.  ? Obesity.  ? Nerve problems. This may include back or pelvic injuries, diabetes mellitus, multiple sclerosis, or Parkinson disease.  What are the signs or symptoms?  Symptoms of this condition include:   Inability to get an erection.   Lack of enough hardness of the erection to allow penetration.   Loss of the erection before sex is finished.   Normal erections at some times, but with frequent unsatisfactory episodes.   Low sexual satisfaction in either partner due to erection problems.   A curved penis occurring with erection. The curve may cause pain or the penis may be too curved to allow for intercourse.   Never having nighttime erections.  How is this diagnosed?  This condition is often diagnosed by:   Performing a physical exam to find other diseases or specific problems with the penis.   Asking you detailed questions about the problem.   Performing blood tests to check for diabetes mellitus or to measure hormone levels.   Performing other tests to check for underlying health conditions.   Performing an ultrasound  exam to check for scarring.   Performing a test to check blood flow to the penis.   Doing a sleep study at home to measure nighttime erections.  How is this treated?  This condition may be treated by:   Medicine taken by mouth to help you achieve an erection (oral medicine).   Hormone replacement therapy to replace low testosterone levels.   Medicine that is injected into the penis. Your health care provider may instruct you how to give yourself these injections at home.   Vacuum pump. This is a pump with a ring on it. The pump and ring are placed on the penis and used to create pressure that helps the penis become erect.   Penile implant surgery. In this procedure, you may receive:  ? An inflatable implant. This consists of cylinders, a pump, and a reservoir. The cylinders can be inflated with a fluid that helps to create an erection, and they can be deflated after intercourse.  ? A semi-rigid implant. This consists of two silicone rubber rods. The rods provide some rigidity. They are also flexible, so the penis can both curve downward in its normal position and become straight for sexual intercourse.   Blood vessel surgery, to improve blood flow to the penis. During this procedure, a blood vessel from a different part of the body is placed into the penis to allow blood to flow around (bypass) damaged or blocked blood vessels.     Lifestyle changes, such as exercising more, losing weight, and quitting smoking.  Follow these instructions at home:  Medicines     Take over-the-counter and prescription medicines only as told by your health care provider. Do not increase the dosage without first discussing it with your health care provider.   If you are using self-injections, perform injections as directed by your health care provider. Make sure to avoid any veins that are on the surface of the penis. After giving an injection, apply pressure to the injection site for 5 minutes.  General  instructions   Exercise regularly, as directed by your health care provider. Work with your health care provider to lose weight, if needed.   Do not use any products that contain nicotine or tobacco, such as cigarettes and e-cigarettes. If you need help quitting, ask your health care provider.   Before using a vacuum pump, read the instructions that come with the pump and discuss any questions with your health care provider.   Keep all follow-up visits as told by your health care provider. This is important.  Contact a health care provider if:   You feel nauseous.   You vomit.  Get help right away if:   You are taking oral or injectable medicines and you have an erection that lasts longer than 4 hours. If your health care provider is unavailable, go to the nearest emergency room for evaluation. An erection that lasts much longer than 4 hours can result in permanent damage to your penis.   You have severe pain in your groin or abdomen.   You develop redness or severe swelling of your penis.   You have redness spreading up into your groin or lower abdomen.   You are unable to urinate.   You experience chest pain or a rapid heart beat (palpitations) after taking oral medicines.  Summary   Erectile dysfunction (ED) is the inability to get or keep an erection during sexual intercourse. This problem can usually be treated successfully.   This condition is diagnosed based on a physical exam, your symptoms, and tests to determine the cause. Treatment varies depending on the cause, and may include medicines, hormone therapy, surgery, or vacuum pump.   You may need follow-up visits to make sure that you are using your medicines or devices correctly.   Get help right away if you are taking or injecting medicines and you have an erection that lasts longer than 4 hours.  This information is not intended to replace advice given to you by your health care provider. Make sure you discuss any questions you have with  your health care provider.  Document Released: 04/07/2000 Document Revised: 04/26/2016 Document Reviewed: 04/26/2016  [employer] Interactive Patient Education  2019 [employer]
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
## 2018-05-28 ENCOUNTER — Other Ambulatory Visit
Admission: RE | Admit: 2018-05-28 | Discharge: 2018-05-28 | Disposition: A | Discharge status: Home / Self Care | Payer: Medicaid Other | Source: Ambulatory Visit | Attending: Adult Health | Admitting: Adult Health

## 2018-05-28 DIAGNOSIS — E039 Hypothyroidism, unspecified: Secondary | ICD-10-CM | POA: Diagnosis present

## 2018-05-28 LAB — TSH: TSH: 0.164 u[IU]/mL — ABNORMAL LOW (ref 0.350–4.500)

## 2018-05-28 LAB — T4, FREE: Free T4: 1.79 ng/dL — ABNORMAL HIGH (ref 0.82–1.77)

## 2018-06-05 ENCOUNTER — Ambulatory Visit: Payer: Medicaid Other | Admitting: Adult Health

## 2018-06-05 ENCOUNTER — Encounter: Payer: Self-pay | Admitting: Adult Health

## 2018-06-05 VITALS — BP 106/78 | HR 72 | Resp 16 | Ht 67.0 in | Wt 185.0 lb

## 2018-06-05 DIAGNOSIS — I10 Essential (primary) hypertension: Secondary | ICD-10-CM | POA: Diagnosis not present

## 2018-06-05 DIAGNOSIS — F17219 Nicotine dependence, cigarettes, with unspecified nicotine-induced disorders: Secondary | ICD-10-CM

## 2018-06-05 DIAGNOSIS — Z0289 Encounter for other administrative examinations: Secondary | ICD-10-CM | POA: Diagnosis not present

## 2018-06-05 DIAGNOSIS — F203 Undifferentiated schizophrenia: Secondary | ICD-10-CM
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
## 2018-06-11 ENCOUNTER — Encounter: Payer: Self-pay | Admitting: Adult Health

## 2018-06-11 ENCOUNTER — Ambulatory Visit: Payer: Medicaid Other | Admitting: Adult Health

## 2018-06-11 VITALS — BP 108/76 | HR 65 | Temp 97.6°F | Resp 18 | Ht 67.0 in | Wt 188.0 lb

## 2018-06-11 DIAGNOSIS — R5383 Other fatigue: Secondary | ICD-10-CM

## 2018-06-11 DIAGNOSIS — R197 Diarrhea, unspecified: Secondary | ICD-10-CM | POA: Diagnosis not present

## 2018-06-11 DIAGNOSIS — L299 Pruritus, unspecified: Secondary | ICD-10-CM | POA: Diagnosis not present

## 2018-06-11 DIAGNOSIS — R251 Tremor, unspecified: Secondary | ICD-10-CM | POA: Diagnosis not present

## 2018-06-11 NOTE — Progress Notes (Signed)
[facility] [place], [place] [address]  Internal MEDICINE  Office Visit Note  Patient Name: Christopher Mason  [id number]  [id number]  Date of Service: 06/11/2018  Chief Complaint  Patient presents with  . Diarrhea    been going on for a few months forgot to mention it ,  . Tremors  . Fatigue    getting real tired real easy   . Pruritis    all over in groin area      HPI Pt is here for a sick visit.  Patient reports ongoing diarrhea that has been intermittent for a few months.  He also reports some fatigue and states that he gets very tired easily with little exertion.  He is also complaining today of itching in his groin and legs. He is concerned because his tremors have become more pronounced.      Current Medication:  Outpatient Encounter Medications as of 06/11/2018  Medication Sig  . albuterol (PROVENTIL HFA;VENTOLIN HFA) 108 (90 BASE) MCG/ACT inhaler Inhale 2 puffs into the lungs every 4 (four) hours as needed for wheezing or shortness of breath.  Marland Kitchen amitriptyline (ELAVIL) 50 MG tablet Take 1 tablet (50 mg total) by mouth at bedtime.  Marland Kitchen amLODipine (NORVASC) 10 MG tablet Take 1 tablet (10 mg total) by mouth daily.  Marland Kitchen docusate sodium (COLACE) 100 MG capsule Take 1 capsule (100 mg total) by mouth daily.  Marland Kitchen levothyroxine (SYNTHROID) 200 MCG tablet Take 1 tablet (200 mcg total) by mouth daily before breakfast.  . lisinopril (PRINIVIL,ZESTRIL) 40 MG tablet Take one tablet (40mg  total) by mouth daily  . paliperidone (INVEGA SUSTENNA) 234 MG/1.5ML SUSP injection Inject 234 mg into the muscle once for 1 dose. Next dose due 08/13/17  . pantoprazole (PROTONIX) 40 MG tablet Take 1 tablet (40 mg total) by mouth daily.  . pravastatin (PRAVACHOL) 10 MG tablet Take 1 tablet (10 mg total) by mouth daily.  . propranolol (INDERAL) 10 MG tablet Take one tablet (10mg  total) by mouth three times a day.  . senna (SENOKOT) 8.6 MG TABS tablet Take 1 tablet (8.6 mg  total) by mouth daily.  . solifenacin (VESICARE) 10 MG tablet Take 1 tab by mouth nightly for overactive bladder  . traZODone (DESYREL) 100 MG tablet Take 100 mg by mouth at bedtime.  Marland Kitchen venlafaxine XR (EFFEXOR-XR) 75 MG 24 hr capsule Take 1 capsule (75 mg total) by mouth daily with breakfast.   No facility-administered encounter medications on file as of 06/11/2018.       Medical History: Past Medical History:  Diagnosis Date  . Acute kidney injury ([place]) 11/20/2014  . ARF (acute renal failure) ([place]) 11/06/2014  . Asthma   . Bipolar disorder ([place])   . COPD (chronic obstructive pulmonary disease) ([place])   . Depression   . Gastritis 02/07/2015  . GERD (gastroesophageal reflux disease)   . GI bleeding 09/05/2015  . HLD (hyperlipidemia)   . Hypertension   . Hypokalemia 11/06/2014  . Hyponatremia 02/07/2015  . Hypotension 11/06/2014  . Hypothyroid   . Irritable bowel syndrome (IBS)   . Non compliance w medication regimen 09/16/2014  . Schizophrenia ([place])      Vital Signs: BP 108/76   Pulse 65   Temp 97.6 F (36.4 C) (Oral)   Resp 18   Ht 5\' 7"  (1.702 m)   Wt 188 lb (85.3 kg)   SpO2 94%   BMI 29.44 kg/m    Review of Systems  Constitutional: Positive  for fatigue. Negative for chills and unexpected weight change.  HENT: Negative.  Negative for congestion, rhinorrhea, sneezing and sore throat.   Eyes: Negative for redness.  Respiratory: Negative.  Negative for cough, chest tightness and shortness of breath.   Cardiovascular: Negative.  Negative for chest pain and palpitations.  Gastrointestinal: Positive for diarrhea. Negative for abdominal pain, constipation, nausea and vomiting.  Endocrine: Negative.   Genitourinary: Negative.  Negative for dysuria and frequency.  Musculoskeletal: Negative.  Negative for arthralgias, back pain, joint swelling and neck pain.  Skin: Negative.  Negative for rash.       Groin itching.  Allergic/Immunologic: Negative.   Neurological: Positive  for tremors. Negative for numbness.  Hematological: Negative for adenopathy. Does not bruise/bleed easily.  Psychiatric/Behavioral: Negative.  Negative for behavioral problems, sleep disturbance and suicidal ideas. The patient is not nervous/anxious.     Physical Exam Vitals signs and nursing note reviewed.  Constitutional:      General: He is not in acute distress.    Appearance: He is well-developed. He is not diaphoretic.  HENT:     Head: Normocephalic and atraumatic.     Mouth/Throat:     Pharynx: No oropharyngeal exudate.  Eyes:     Pupils: Pupils are equal, round, and reactive to light.  Neck:     Musculoskeletal: Normal range of motion and neck supple.     Thyroid: No thyromegaly.     Vascular: No JVD.     Trachea: No tracheal deviation.  Cardiovascular:     Rate and Rhythm: Normal rate and regular rhythm.     Heart sounds: Normal heart sounds. No murmur. No friction rub. No gallop.   Pulmonary:     Effort: Pulmonary effort is normal. No respiratory distress.     Breath sounds: Normal breath sounds. No wheezing or rales.  Chest:     Chest wall: No tenderness.  Abdominal:     Palpations: Abdomen is soft.     Tenderness: There is no abdominal tenderness. There is no guarding.  Musculoskeletal: Normal range of motion.  Lymphadenopathy:     Cervical: No cervical adenopathy.  Skin:    General: Skin is warm and dry.     Comments: Dry skin, no rash/reddness noted in groin.  Neurological:     Mental Status: He is alert and oriented to person, place, and time.     Cranial Nerves: No cranial nerve deficit.  Psychiatric:        Behavior: Behavior normal.        Thought Content: Thought content normal.        Judgment: Judgment normal.   Assessment/Plan: 1. Fatigue, unspecified type Fatigue labs ordered on a prescription pad to give patient so that he may go to the hospital and have them drawn.  We will follow-up with patient in 3 weeks.   2. Diarrhea, unspecified  type Patient takes Colace daily.  I have encouraged him to stop taking his Colace for a few days and then to reassess his diarrhea.  Also the possibility of him taking it every other day to try and curb some of this diarrhea.  Patient verbalized understanding and states he will try it and will return in 3 weeks to see what the progress is made.  3. Itching Pt appears to have dry skin in groin and legs.  I have encouraged patient to use lotion and a barrier cream such as Desitin to see if this will help calm some.  Again follow-up  in 3 weeks.  4. Tremor As far as his tremor, He denies ever having seen a neurologist.  However, he is on a few psych meds. I have encouraged him to speak with his psychiatrist about his medications causing these tremors.  We will continue to monitor, will refer to neurology is it worsens.    General Counseling: patric buckhalter understanding of the findings of todays visit and agrees with plan of treatment. I have discussed any further diagnostic evaluation that may be needed or ordered today. We also reviewed his medications today. he has been encouraged to call the office with any questions or concerns that should arise related to todays visit.   No orders of the defined types were placed in this encounter.   No orders of the defined types were placed in this encounter.   Time spent: 25 Minutes  This patient was seen by Orson Gear AGNP-C in Collaboration with Dr Lavera Guise as a part of collaborative care agreement.  Kendell Bane AGNP-C Internal Medicine

## 2018-06-11 NOTE — Patient Instructions (Signed)

## 2018-06-25 ENCOUNTER — Ambulatory Visit: Payer: Self-pay | Admitting: Urology

## 2018-06-25 ENCOUNTER — Encounter: Payer: Self-pay | Admitting: Urology

## 2018-06-28 ENCOUNTER — Other Ambulatory Visit: Payer: Self-pay

## 2018-06-28 DIAGNOSIS — E039 Hypothyroidism, unspecified: Secondary | ICD-10-CM

## 2018-06-28 MED ORDER — LEVOTHYROXINE SODIUM 200 MCG PO TABS: 200.0000 ug | ORAL_TABLET | Freq: Every day | ORAL | 2 refills | Status: DC

## 2018-06-28 MED ORDER — DOCUSATE SODIUM 100 MG PO CAPS: 100.0000 mg | ORAL_CAPSULE | Freq: Every day | ORAL | 3 refills | Status: DC

## 2018-07-02 ENCOUNTER — Ambulatory Visit: Payer: Self-pay | Admitting: Adult Health

## 2018-07-03 ENCOUNTER — Other Ambulatory Visit: Payer: Self-pay

## 2018-07-03 ENCOUNTER — Emergency Department
Admission: EM | Admit: 2018-07-03 | Discharge: 2018-07-03 | Disposition: A | Discharge status: Home / Self Care | Payer: Medicaid Other | Attending: Emergency Medicine | Admitting: Emergency Medicine

## 2018-07-03 ENCOUNTER — Emergency Department: Payer: Medicaid Other

## 2018-07-03 ENCOUNTER — Encounter: Payer: Self-pay | Admitting: Emergency Medicine

## 2018-07-03 DIAGNOSIS — F17210 Nicotine dependence, cigarettes, uncomplicated: Secondary | ICD-10-CM | POA: Insufficient documentation

## 2018-07-03 DIAGNOSIS — E039 Hypothyroidism, unspecified: Secondary | ICD-10-CM | POA: Insufficient documentation

## 2018-07-03 DIAGNOSIS — R1030 Lower abdominal pain, unspecified: Secondary | ICD-10-CM | POA: Insufficient documentation

## 2018-07-03 DIAGNOSIS — I10 Essential (primary) hypertension: Secondary | ICD-10-CM | POA: Diagnosis not present

## 2018-07-03 DIAGNOSIS — R197 Diarrhea, unspecified: Secondary | ICD-10-CM | POA: Insufficient documentation

## 2018-07-03 DIAGNOSIS — M25562 Pain in left knee: Secondary | ICD-10-CM | POA: Diagnosis not present

## 2018-07-03 DIAGNOSIS — J449 Chronic obstructive pulmonary disease, unspecified: Secondary | ICD-10-CM | POA: Diagnosis not present

## 2018-07-03 DIAGNOSIS — A09 Infectious gastroenteritis and colitis, unspecified: Secondary | ICD-10-CM

## 2018-07-03 LAB — COMPREHENSIVE METABOLIC PANEL
ALBUMIN: 3.7 g/dL (ref 3.5–5.0)
ALT: 16 U/L (ref 0–44)
ANION GAP: 8 (ref 5–15)
AST: 17 U/L (ref 15–41)
Alkaline Phosphatase: 91 U/L (ref 38–126)
BUN: 6 mg/dL (ref 6–20)
CO2: 24 mmol/L (ref 22–32)
Calcium: 8.7 mg/dL — ABNORMAL LOW (ref 8.9–10.3)
Chloride: 105 mmol/L (ref 98–111)
Creatinine, Ser: 0.95 mg/dL (ref 0.61–1.24)
GFR calc Af Amer: >60 mL/min (ref >60)
GFR calc non Af Amer: >60 mL/min (ref >60)
Glucose, Bld: 160 mg/dL — ABNORMAL HIGH (ref 70–99)
Potassium: 3.6 mmol/L (ref 3.5–5.1)
Sodium: 137 mmol/L (ref 135–145)
Total Bilirubin: 0.7 mg/dL (ref 0.3–1.2)
Total Protein: 6.9 g/dL (ref 6.5–8.1)

## 2018-07-03 LAB — CBC
HCT: 41.4 % (ref 39.0–52.0)
Hemoglobin: 13.1 g/dL (ref 13.0–17.0)
MCH: 28.5 pg (ref 26.0–34.0)
MCHC: 31.6 g/dL (ref 30.0–36.0)
MCV: 90.2 fL (ref 80.0–100.0)
Platelets: 272 10*3/uL (ref 150–400)
RBC: 4.59 MIL/uL (ref 4.22–5.81)
RDW: 14.5 % (ref 11.5–15.5)
WBC: 9 10*3/uL (ref 4.0–10.5)
nRBC: 0 % (ref 0.0–0.2)

## 2018-07-03 LAB — LIPASE, BLOOD: Lipase: 24 U/L (ref 11–51)

## 2018-07-03 LAB — URINALYSIS, COMPLETE (UACMP) WITH MICROSCOPIC
Bacteria, UA: NONE SEEN
Bilirubin Urine: NEGATIVE
Glucose, UA: NEGATIVE mg/dL
Hgb urine dipstick: NEGATIVE
Ketones, ur: 5 mg/dL — AB
Leukocytes,Ua: NEGATIVE
Nitrite: NEGATIVE
PH: 6 (ref 5.0–8.0)
Protein, ur: NEGATIVE mg/dL
Specific Gravity, Urine: 1.012 (ref 1.005–1.030)

## 2018-07-03 MED ORDER — SODIUM CHLORIDE 0.9% FLUSH: 3.0000 mL | Freq: Once | INTRAVENOUS | Status: DC

## 2018-07-03 MED ORDER — LOPERAMIDE HCL 2 MG PO TABS: 2.0000 mg | ORAL_TABLET | Freq: Four times a day (QID) | ORAL | 0 refills | Status: DC | PRN

## 2018-07-03 MED ORDER — TRAMADOL HCL 50 MG PO TABS: 50.0000 mg | ORAL_TABLET | Freq: Four times a day (QID) | ORAL | 0 refills | Status: AC | PRN

## 2018-07-03 MED ORDER — KETOROLAC TROMETHAMINE 30 MG/ML IJ SOLN
30.0000 mg | Freq: Once | INTRAMUSCULAR | Status: AC
  Administered 2018-07-03: 30 mg via INTRAVENOUS
  Filled 2018-07-03: qty 1

## 2018-07-03 NOTE — ED Provider Notes (Signed)
[facility] Emergency Department Provider Note       Time seen: ----------------------------------------- 3:32 PM on 07/03/2018 -----------------------------------------   I have reviewed the triage vital signs and the nursing notes.  HISTORY   Chief Complaint Diarrhea; Groin Pain; and Knee Pain    HPI Christopher Mason is a 58 y.o. male with a history of acute kidney injury, renal failure, COPD, depression, hypertension, hyponatremia, IBS, schizophrenia who presents to the ED for left knee pain for a week.  Patient also complaining of right knee pain.  He also has pain in his left groin where he had hernia repair and mesh placed.  He is also been having diarrhea for a month.  Past Medical History:  Diagnosis Date  . Acute kidney injury ([place]) 11/20/2014  . ARF (acute renal failure) ([place]) 11/06/2014  . Asthma   . Bipolar disorder ([place])   . COPD (chronic obstructive pulmonary disease) ([place])   . Depression   . Gastritis 02/07/2015  . GERD (gastroesophageal reflux disease)   . GI bleeding 09/05/2015  . HLD (hyperlipidemia)   . Hypertension   . Hypokalemia 11/06/2014  . Hyponatremia 02/07/2015  . Hypotension 11/06/2014  . Hypothyroid   . Irritable bowel syndrome (IBS)   . Non compliance w medication regimen 09/16/2014  . Schizophrenia [facility])     Patient Active Problem List   Diagnosis Date Noted  . Abdominal pain, left lower quadrant 10/31/2017  . Acute cystitis without hematuria 10/31/2017  . Other fatigue 10/31/2017  . Cigarette nicotine dependence without complication 32/67/1245  . Deep vein thrombosis (DVT) of tibial vein of right lower extremity ([place]) 10/30/2017  . Acute right ankle pain 10/10/2017  . Acute pain of right knee 10/10/2017  . Lower extremity edema 10/10/2017  . Recurrent left inguinal hernia 10/10/2017  . 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 HLD (hyperlipidemia) 02/07/2015  . COPD (chronic obstructive pulmonary  disease) ([place]) 02/07/2015  . Cannabis use disorder, severe, dependence ([place]) 09/24/2014  . Hypothyroidism 09/17/2014  . GERD (gastroesophageal reflux disease) 09/17/2014  . Tobacco use disorder 09/17/2014  . Asthma 09/16/2014  . HTN (hypertension) 09/16/2014  . Tardive dyskinesia 09/16/2014    Past Surgical History:  Procedure Laterality Date  . BACK SURGERY    . CARPAL TUNNEL RELEASE Bilateral   . CHOLECYSTECTOMY    . ESOPHAGOGASTRODUODENOSCOPY (EGD) WITH PROPOFOL N/A 09/06/2015   Procedure: ESOPHAGOGASTRODUODENOSCOPY (EGD) WITH PROPOFOL;  Surgeon: Hulen Luster, MD;  Location: [facility] ENDOSCOPY;  Service: Endoscopy;  Laterality: N/A;  . ROTATOR CUFF REPAIR     2007    Allergies Aspirin  Social History Social History   Tobacco Use  . Smoking status: Current Every Day Smoker    Packs/day: 1.00    Types: Cigarettes  . Smokeless tobacco: Never Used  Substance Use Topics  . Alcohol use: No  . Drug use: Yes    Frequency: 1.0 times per week    Types: Marijuana    Comment: last smoked today    Review of Systems Constitutional: Negative for fever. Cardiovascular: Negative for chest pain. Respiratory: Negative for shortness of breath. Gastrointestinal: Positive for abdominal pain Musculoskeletal: Positive for left leg pain Skin: Negative for rash. Neurological: Negative for headaches, focal weakness or numbness.  All systems negative/normal/unremarkable except as stated in the HPI  ____________________________________________   PHYSICAL EXAM:  VITAL SIGNS: ED Triage Vitals  Enc Vitals Group     BP 07/03/18 1510 106/81     Pulse Rate 07/03/18 1510 86  Resp 07/03/18 1510 16     Temp 07/03/18 1510 98.5 F (36.9 C)     Temp Source 07/03/18 1510 Oral     SpO2 07/03/18 1510 91 %     Weight 07/03/18 1511 184 lb (83.5 kg)     Height 07/03/18 1511 5\' 7"  (1.702 m)     Head Circumference --      Peak Flow --      Pain Score 07/03/18 1511 9     Pain Loc --      Pain  Edu? --      Excl. in [place]? --    Constitutional: Alert and oriented. Well appearing and in no distress. Eyes: Conjunctivae are normal. Normal extraocular movements. Cardiovascular: Normal rate, regular rhythm. No murmurs, rubs, or gallops. Respiratory: Normal respiratory effort without tachypnea nor retractions. Breath sounds are clear and equal bilaterally. No wheezes/rales/rhonchi. Gastrointestinal: Soft and nontender. Normal bowel sounds Genitourinary: No specific inguinal swelling or masses appreciated Musculoskeletal: Nontender with normal range of motion in extremities. No lower extremity tenderness nor edema. Neurologic:  Normal speech and language. No gross focal neurologic deficits are appreciated.  Skin:  Skin is warm, dry and intact. No rash noted. Psychiatric: Mood and affect are normal. Speech and behavior are normal.   ____________________________________________  ED COURSE:  As part of my medical decision making, I reviewed the following data within the [place] History obtained from family if available, nursing notes, old chart and ekg, as well as notes from prior ED visits. Patient presented for multiple complaints, we will assess with labs and imaging as indicated at this time.   Procedures ____________________________________________   LABS (pertinent positives/negatives)  Labs Reviewed  COMPREHENSIVE METABOLIC PANEL - Abnormal; Notable for the following components:      Result Value   Glucose, Bld 160 (*)    Calcium 8.7 (*)    All other components within normal limits  URINALYSIS, COMPLETE (UACMP) WITH MICROSCOPIC - Abnormal; Notable for the following components:   Color, Urine YELLOW (*)    APPearance CLEAR (*)    Ketones, ur 5 (*)    All other components within normal limits  LIPASE, BLOOD  CBC    RADIOLOGY Images were viewed by me Abdomen 2 view, left knee x-ray IMPRESSION: Left knee arthroplasty, without evidence of  complication.  Mild suprapatellar knee joint effusion. IMPRESSION: No evidence of small bowel obstruction or free air.  Mesh repair of the right inguinal region.   ____________________________________________   DIFFERENTIAL DIAGNOSIS   Musculoskeletal pain, arthritis, chronic pain, diarrhea  FINAL ASSESSMENT AND PLAN  Groin pain, knee pain   Plan: The patient had presented for multiple complaints. Patient's labs were reassuring. Patient's imaging reveal any acute process.  He is cleared for outpatient follow-up.   Laurence Aly, MD    Note: This note was generated in part or whole with voice recognition software. Voice recognition is usually quite accurate but there are transcription errors that can and very often do occur. I apologize for any typographical errors that were not detected and corrected.     Earleen Newport, MD 07/03/18 [phone]

## 2018-07-03 NOTE — ED Notes (Signed)
Patient transported to X-ray 

## 2018-07-03 NOTE — ED Triage Notes (Signed)
Says he has left knee pain for a week. . Also rdight knee pain.  Also says he has pain in left groin where the hernia mesh is and has been haivng diarrhea for a month.

## 2018-07-11 ENCOUNTER — Ambulatory Visit (INDEPENDENT_AMBULATORY_CARE_PROVIDER_SITE_OTHER): Payer: Medicaid Other | Admitting: Adult Health

## 2018-07-11 ENCOUNTER — Emergency Department: Payer: Medicaid Other

## 2018-07-11 ENCOUNTER — Telehealth: Payer: Self-pay

## 2018-07-11 ENCOUNTER — Encounter: Payer: Self-pay | Admitting: Adult Health

## 2018-07-11 ENCOUNTER — Inpatient Hospital Stay
Admission: EM | Admit: 2018-07-11 | Discharge: 2018-07-14 | DRG: 190 | Disposition: A | Discharge status: Home / Self Care | Payer: Medicaid Other | Attending: Internal Medicine | Admitting: Internal Medicine

## 2018-07-11 ENCOUNTER — Other Ambulatory Visit: Payer: Self-pay

## 2018-07-11 ENCOUNTER — Encounter: Payer: Self-pay | Admitting: Emergency Medicine

## 2018-07-11 VITALS — BP 92/68 | HR 69 | Temp 97.8°F | Resp 22 | Ht 67.0 in | Wt 179.0 lb

## 2018-07-11 DIAGNOSIS — E039 Hypothyroidism, unspecified: Secondary | ICD-10-CM | POA: Diagnosis present

## 2018-07-11 DIAGNOSIS — F319 Bipolar disorder, unspecified: Secondary | ICD-10-CM | POA: Diagnosis present

## 2018-07-11 DIAGNOSIS — K589 Irritable bowel syndrome without diarrhea: Secondary | ICD-10-CM | POA: Diagnosis present

## 2018-07-11 DIAGNOSIS — I10 Essential (primary) hypertension: Secondary | ICD-10-CM | POA: Diagnosis present

## 2018-07-11 DIAGNOSIS — F17210 Nicotine dependence, cigarettes, uncomplicated: Secondary | ICD-10-CM | POA: Diagnosis present

## 2018-07-11 DIAGNOSIS — F1290 Cannabis use, unspecified, uncomplicated: Secondary | ICD-10-CM | POA: Diagnosis present

## 2018-07-11 DIAGNOSIS — R062 Wheezing: Secondary | ICD-10-CM

## 2018-07-11 DIAGNOSIS — J9601 Acute respiratory failure with hypoxia: Secondary | ICD-10-CM | POA: Diagnosis present

## 2018-07-11 DIAGNOSIS — F209 Schizophrenia, unspecified: Secondary | ICD-10-CM | POA: Diagnosis present

## 2018-07-11 DIAGNOSIS — I959 Hypotension, unspecified: Secondary | ICD-10-CM | POA: Diagnosis present

## 2018-07-11 DIAGNOSIS — Z86718 Personal history of other venous thrombosis and embolism: Secondary | ICD-10-CM | POA: Diagnosis not present

## 2018-07-11 DIAGNOSIS — K219 Gastro-esophageal reflux disease without esophagitis: Secondary | ICD-10-CM | POA: Diagnosis present

## 2018-07-11 DIAGNOSIS — F203 Undifferentiated schizophrenia: Secondary | ICD-10-CM | POA: Diagnosis present

## 2018-07-11 DIAGNOSIS — Z8249 Family history of ischemic heart disease and other diseases of the circulatory system: Secondary | ICD-10-CM

## 2018-07-11 DIAGNOSIS — J988 Other specified respiratory disorders: Secondary | ICD-10-CM | POA: Diagnosis not present

## 2018-07-11 DIAGNOSIS — Z79899 Other long term (current) drug therapy: Secondary | ICD-10-CM | POA: Diagnosis not present

## 2018-07-11 DIAGNOSIS — Z79891 Long term (current) use of opiate analgesic: Secondary | ICD-10-CM | POA: Diagnosis not present

## 2018-07-11 DIAGNOSIS — J029 Acute pharyngitis, unspecified: Secondary | ICD-10-CM | POA: Diagnosis not present

## 2018-07-11 DIAGNOSIS — R0902 Hypoxemia: Secondary | ICD-10-CM

## 2018-07-11 DIAGNOSIS — Z7952 Long term (current) use of systemic steroids: Secondary | ICD-10-CM | POA: Diagnosis not present

## 2018-07-11 DIAGNOSIS — Z9049 Acquired absence of other specified parts of digestive tract: Secondary | ICD-10-CM

## 2018-07-11 DIAGNOSIS — R197 Diarrhea, unspecified: Secondary | ICD-10-CM

## 2018-07-11 DIAGNOSIS — Z79890 Hormone replacement therapy (postmenopausal): Secondary | ICD-10-CM | POA: Diagnosis not present

## 2018-07-11 DIAGNOSIS — J4520 Mild intermittent asthma, uncomplicated: Secondary | ICD-10-CM

## 2018-07-11 DIAGNOSIS — E785 Hyperlipidemia, unspecified: Secondary | ICD-10-CM | POA: Diagnosis present

## 2018-07-11 DIAGNOSIS — J441 Chronic obstructive pulmonary disease with (acute) exacerbation: Principal | ICD-10-CM | POA: Diagnosis present

## 2018-07-11 DIAGNOSIS — Z886 Allergy status to analgesic agent status: Secondary | ICD-10-CM

## 2018-07-11 DIAGNOSIS — F17200 Nicotine dependence, unspecified, uncomplicated: Secondary | ICD-10-CM | POA: Diagnosis present

## 2018-07-11 HISTORY — DX: Chronic obstructive pulmonary disease with (acute) exacerbation: J44.1

## 2018-07-11 LAB — CBC WITH DIFFERENTIAL/PLATELET
Abs Immature Granulocytes: 0.02 10*3/uL (ref 0.00–0.07)
Basophils Absolute: 0 10*3/uL (ref 0.0–0.1)
Basophils Relative: 0 %
EOS ABS: 0 10*3/uL (ref 0.0–0.5)
Eosinophils Relative: 0 %
HCT: 42.2 % (ref 39.0–52.0)
Hemoglobin: 13.2 g/dL (ref 13.0–17.0)
Immature Granulocytes: 0 %
LYMPHS ABS: 0.5 10*3/uL — AB (ref 0.7–4.0)
Lymphocytes Relative: 8 %
MCH: 28.6 pg (ref 26.0–34.0)
MCHC: 31.3 g/dL (ref 30.0–36.0)
MCV: 91.3 fL (ref 80.0–100.0)
Monocytes Absolute: 0 10*3/uL — ABNORMAL LOW (ref 0.1–1.0)
Monocytes Relative: 1 %
NRBC: 0 % (ref 0.0–0.2)
Neutro Abs: 5.2 10*3/uL (ref 1.7–7.7)
Neutrophils Relative %: 91 %
Platelets: 222 10*3/uL (ref 150–400)
RBC: 4.62 MIL/uL (ref 4.22–5.81)
RDW: 14.6 % (ref 11.5–15.5)
WBC: 5.7 10*3/uL (ref 4.0–10.5)

## 2018-07-11 LAB — CBC
HCT: 42.1 % (ref 39.0–52.0)
HEMOGLOBIN: 13.2 g/dL (ref 13.0–17.0)
MCH: 28.3 pg (ref 26.0–34.0)
MCHC: 31.4 g/dL (ref 30.0–36.0)
MCV: 90.3 fL (ref 80.0–100.0)
NRBC: 0 % (ref 0.0–0.2)
Platelets: 228 10*3/uL (ref 150–400)
RBC: 4.66 MIL/uL (ref 4.22–5.81)
RDW: 14.6 % (ref 11.5–15.5)
WBC: 6.1 10*3/uL (ref 4.0–10.5)

## 2018-07-11 LAB — BLOOD GAS, VENOUS
Acid-Base Excess: 0.6 mmol/L (ref 0.0–2.0)
Bicarbonate: 26.6 mmol/L (ref 20.0–28.0)
O2 Saturation: 58.6 %
Patient temperature: 37
pCO2, Ven: 47 mmHg (ref 44.0–60.0)
pH, Ven: 7.36 (ref 7.250–7.430)
pO2, Ven: 32 mmHg (ref 32.0–45.0)

## 2018-07-11 LAB — INFLUENZA PANEL BY PCR (TYPE A & B)
Influenza A By PCR: NEGATIVE
Influenza B By PCR: NEGATIVE

## 2018-07-11 LAB — BASIC METABOLIC PANEL
Anion gap: 9 (ref 5–15)
BUN: 20 mg/dL (ref 6–20)
CO2: 27 mmol/L (ref 22–32)
Calcium: 8.7 mg/dL — ABNORMAL LOW (ref 8.9–10.3)
Chloride: 102 mmol/L (ref 98–111)
Creatinine, Ser: 1.2 mg/dL (ref 0.61–1.24)
GFR calc Af Amer: >60 mL/min (ref >60)
GFR calc non Af Amer: >60 mL/min (ref >60)
Glucose, Bld: 93 mg/dL (ref 70–99)
POTASSIUM: 4.5 mmol/L (ref 3.5–5.1)
SODIUM: 138 mmol/L (ref 135–145)

## 2018-07-11 LAB — POCT INFLUENZA A/B
Influenza A, POC: NEGATIVE
Influenza B, POC: NEGATIVE

## 2018-07-11 LAB — BRAIN NATRIURETIC PEPTIDE: B Natriuretic Peptide: 24 pg/mL (ref 0.0–100.0)

## 2018-07-11 LAB — PROCALCITONIN: Procalcitonin: <0.1 ng/mL

## 2018-07-11 LAB — LACTIC ACID, PLASMA
Lactic Acid, Venous: 1 mmol/L (ref 0.5–1.9)
Lactic Acid, Venous: 0.7 mmol/L (ref 0.5–1.9)

## 2018-07-11 LAB — TROPONIN I

## 2018-07-11 MED ORDER — GUAIFENESIN-DM 100-10 MG/5ML PO SYRP
5.0000 mL | ORAL_SOLUTION | ORAL | Status: DC | PRN
  Administered 2018-07-13: 14:00:00 5 mL via ORAL
  Filled 2018-07-11: qty 5

## 2018-07-11 MED ORDER — IPRATROPIUM-ALBUTEROL 0.5-2.5 (3) MG/3ML IN SOLN: 3.0000 mL | Freq: Four times a day (QID) | RESPIRATORY_TRACT | 1 refills | Status: DC | PRN

## 2018-07-11 MED ORDER — IPRATROPIUM-ALBUTEROL 0.5-2.5 (3) MG/3ML IN SOLN
3.0000 mL | RESPIRATORY_TRACT | Status: DC | PRN
  Administered 2018-07-12: 3 mL via RESPIRATORY_TRACT
  Filled 2018-07-11: qty 3

## 2018-07-11 MED ORDER — METHYLPREDNISOLONE SODIUM SUCC 125 MG IJ SOLR
60.0000 mg | Freq: Four times a day (QID) | INTRAMUSCULAR | Status: DC
  Administered 2018-07-11 – 2018-07-12 (×3): 60 mg via INTRAVENOUS
  Filled 2018-07-11 (×3): qty 2

## 2018-07-11 MED ORDER — ENOXAPARIN SODIUM 40 MG/0.4ML ~~LOC~~ SOLN
40.0000 mg | SUBCUTANEOUS | Status: DC
  Administered 2018-07-11 – 2018-07-13 (×3): 40 mg via SUBCUTANEOUS
  Filled 2018-07-11 (×3): qty 0.4

## 2018-07-11 MED ORDER — SODIUM CHLORIDE 0.9 % IV SOLN
2.0000 g | INTRAVENOUS | Status: DC
  Administered 2018-07-11: 2 g via INTRAVENOUS
  Filled 2018-07-11 (×2): qty 20

## 2018-07-11 MED ORDER — PRAVASTATIN SODIUM 10 MG PO TABS
10.0000 mg | ORAL_TABLET | Freq: Every day | ORAL | Status: DC
  Administered 2018-07-12 – 2018-07-14 (×3): 10 mg via ORAL
  Filled 2018-07-11 (×4): qty 1

## 2018-07-11 MED ORDER — PANTOPRAZOLE SODIUM 40 MG PO TBEC
40.0000 mg | DELAYED_RELEASE_TABLET | Freq: Every day | ORAL | Status: DC
  Administered 2018-07-12 – 2018-07-14 (×3): 40 mg via ORAL
  Filled 2018-07-11 (×3): qty 1

## 2018-07-11 MED ORDER — LEVOTHYROXINE SODIUM 100 MCG PO TABS
200.0000 ug | ORAL_TABLET | Freq: Every day | ORAL | Status: DC
  Administered 2018-07-12 – 2018-07-14 (×3): 200 ug via ORAL
  Filled 2018-07-11 (×3): qty 2

## 2018-07-11 MED ORDER — IOHEXOL 300 MG/ML  SOLN
75.0000 mL | Freq: Once | INTRAMUSCULAR | Status: AC | PRN
  Administered 2018-07-11: 75 mL via INTRAVENOUS

## 2018-07-11 MED ORDER — AMITRIPTYLINE HCL 25 MG PO TABS
50.0000 mg | ORAL_TABLET | Freq: Every day | ORAL | Status: DC
  Administered 2018-07-11 – 2018-07-13 (×3): 50 mg via ORAL
  Filled 2018-07-11 (×3): qty 2

## 2018-07-11 MED ORDER — SODIUM CHLORIDE 0.9 % IV SOLN
500.0000 mg | INTRAVENOUS | Status: DC
  Administered 2018-07-11: 500 mg via INTRAVENOUS
  Filled 2018-07-11 (×3): qty 500

## 2018-07-11 MED ORDER — TRAZODONE HCL 50 MG PO TABS
100.0000 mg | ORAL_TABLET | Freq: Every day | ORAL | Status: DC
  Administered 2018-07-11 – 2018-07-13 (×3): 100 mg via ORAL
  Filled 2018-07-11 (×3): qty 2

## 2018-07-11 MED ORDER — PROPRANOLOL HCL 10 MG PO TABS
10.0000 mg | ORAL_TABLET | Freq: Three times a day (TID) | ORAL | Status: DC
  Administered 2018-07-12 – 2018-07-14 (×7): 10 mg via ORAL
  Filled 2018-07-11 (×10): qty 1

## 2018-07-11 MED ORDER — ACETAMINOPHEN 325 MG PO TABS
650.0000 mg | ORAL_TABLET | Freq: Four times a day (QID) | ORAL | Status: DC | PRN
  Administered 2018-07-13 – 2018-07-14 (×2): 650 mg via ORAL
  Filled 2018-07-11 (×2): qty 2

## 2018-07-11 MED ORDER — METHYLPREDNISOLONE ACETATE 80 MG/ML IJ SUSP
80.0000 mg | Freq: Once | INTRAMUSCULAR | Status: AC
  Administered 2018-07-11: 40 mg via INTRAMUSCULAR

## 2018-07-11 MED ORDER — ONDANSETRON HCL 4 MG PO TABS: 4.0000 mg | ORAL_TABLET | Freq: Four times a day (QID) | ORAL | Status: DC | PRN

## 2018-07-11 MED ORDER — PREDNISONE 10 MG PO TABS: ORAL_TABLET | ORAL | 0 refills | Status: DC

## 2018-07-11 MED ORDER — ONDANSETRON HCL 4 MG/2ML IJ SOLN: 4.0000 mg | Freq: Four times a day (QID) | INTRAMUSCULAR | Status: DC | PRN

## 2018-07-11 MED ORDER — ACETAMINOPHEN 650 MG RE SUPP: 650.0000 mg | Freq: Four times a day (QID) | RECTAL | Status: DC | PRN

## 2018-07-11 MED ORDER — BENZONATATE 100 MG PO CAPS
200.0000 mg | ORAL_CAPSULE | Freq: Three times a day (TID) | ORAL | Status: DC | PRN
  Administered 2018-07-13: 14:00:00 200 mg via ORAL
  Filled 2018-07-11: qty 2

## 2018-07-11 MED ORDER — IPRATROPIUM-ALBUTEROL 0.5-2.5 (3) MG/3ML IN SOLN
3.0000 mL | Freq: Once | RESPIRATORY_TRACT | Status: AC
  Administered 2018-07-11: 3 mL via RESPIRATORY_TRACT

## 2018-07-11 MED ORDER — AZITHROMYCIN 250 MG PO TABS: ORAL_TABLET | ORAL | 0 refills | Status: DC

## 2018-07-11 MED ORDER — VENLAFAXINE HCL ER 75 MG PO CP24
75.0000 mg | ORAL_CAPSULE | Freq: Every day | ORAL | Status: DC
  Administered 2018-07-12 – 2018-07-14 (×3): 75 mg via ORAL
  Filled 2018-07-11 (×3): qty 1

## 2018-07-11 NOTE — ED Provider Notes (Addendum)
[facility] Emergency Department Provider Note  ____________________________________________  Time seen: Approximately 5:00 PM  I have reviewed the triage vital signs and the nursing notes.   HISTORY  Chief Complaint Shortness of Breath; Cough; and Near Syncope    HPI Christopher Mason is a 58 y.o. male with a history of bipolar disorder, schizophrenia, COPD and ongoing tobacco abuse, HTN, lives in a group home presenting with hypotension, cough, and hypoxia.  The patient reports that for the past several days, he has been having a nonproductive cough without congestion or rhinorrhea, sore throat or ear pain.  He has had body aches and several episodes of diarrhea.  He does have 1 sick contact at his group home.  The patient went to his PCP today and was found to be hypoxic to the low 16X with a systolic blood pressure of 74.  He was given intravenous fluids, albuterol and a DuoNeb, as well as Solu-Medrol; his blood pressure improved and his O2 sats have been in the mid 90s on 2 L nasal cannula.  No travel outside the [place].  Past Medical History:  Diagnosis Date  . Acute kidney injury ([place]) 11/20/2014  . ARF (acute renal failure) ([place]) 11/06/2014  . Asthma   . Bipolar disorder ([place])   . COPD (chronic obstructive pulmonary disease) ([place])   . Depression   . Gastritis 02/07/2015  . GERD (gastroesophageal reflux disease)   . GI bleeding 09/05/2015  . HLD (hyperlipidemia)   . Hypertension   . Hypokalemia 11/06/2014  . Hyponatremia 02/07/2015  . Hypotension 11/06/2014  . Hypothyroid   . Irritable bowel syndrome (IBS)   . Non compliance w medication regimen 09/16/2014  . Schizophrenia [facility])     Patient Active Problem List   Diagnosis Date Noted  . Abdominal pain, left lower quadrant 10/31/2017  . Acute cystitis without hematuria 10/31/2017  . Other fatigue 10/31/2017  . Cigarette nicotine dependence without complication 09/60/4540  . Deep vein  thrombosis (DVT) of tibial vein of right lower extremity ([place]) 10/30/2017  . Acute right ankle pain 10/10/2017  . Acute pain of right knee 10/10/2017  . Lower extremity edema 10/10/2017  . Recurrent left inguinal hernia 10/10/2017  . 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 HLD (hyperlipidemia) 02/07/2015  . COPD (chronic obstructive pulmonary disease) ([place]) 02/07/2015  . Cannabis use disorder, severe, dependence ([place]) 09/24/2014  . Hypothyroidism 09/17/2014  . GERD (gastroesophageal reflux disease) 09/17/2014  . Tobacco use disorder 09/17/2014  . Asthma 09/16/2014  . HTN (hypertension) 09/16/2014  . Tardive dyskinesia 09/16/2014    Past Surgical History:  Procedure Laterality Date  . BACK SURGERY    . CARPAL TUNNEL RELEASE Bilateral   . CHOLECYSTECTOMY    . ESOPHAGOGASTRODUODENOSCOPY (EGD) WITH PROPOFOL N/A 09/06/2015   Procedure: ESOPHAGOGASTRODUODENOSCOPY (EGD) WITH PROPOFOL;  Surgeon: Hulen Luster, MD;  Location[facility] ENDOSCOPY;  Service: Endoscopy;  Laterality: N/A;  . ROTATOR CUFF REPAIR     2007    Current Outpatient Rx  . Order #: [id number] Class: Print  . Order #: [id number] Class: Print  . Order #: [id number] Class: Normal  . Order #: [id number] Class: Normal  . Order #: [id number] Class: Normal  . Order #: [id number] Class: Normal  . Order #: [id number] Class: Normal  . Order #: [id number] Class: Normal  . Order #: [id number] Class: Print  . Order #: [id number] Class: No Print  . Order #: [id number] Class: Normal  . Order #: [id number] Class: Normal  . Order #: [id number] Class: Normal  . Order #: [id number] Class: Normal  .  Order #: [id number] Class: Normal  . Order #: [id number] Class: Normal  . Order #: [id number] Class: Print  . Order #: [id number] Class: Historical Med  . Order #: [id number] Class: Print    Allergies Aspirin  Family History  Problem Relation Age of Onset  . Hypertension Father   . Cataracts Father   . Diabetes Sister   . Diabetes Brother   . Dementia  Mother     Social History Social History   Tobacco Use  . Smoking status: Current Every Day Smoker    Packs/day: 1.00    Types: Cigarettes  . Smokeless tobacco: Never Used  Substance Use Topics  . Alcohol use: No  . Drug use: Yes    Frequency: 1.0 times per week    Types: Marijuana    Comment: last smoked today    Review of Systems Constitutional: No fever/chills.  No lightheadedness or syncope.  Positive general malaise.  Positive body aches Eyes: No visual changes.  No eye discharge. ENT: No sore throat. No congestion or rhinorrhea. Cardiovascular: Denies chest pain. Denies palpitations. Respiratory: Positive hypoxia shortness of breath.  Positive cough. Gastrointestinal: No abdominal pain.  No nausea, no vomiting.  No diarrhea.  No constipation. Genitourinary: Negative for dysuria. Musculoskeletal: Negative for back pain.  No lower extremity swelling or calf pain. Skin: Negative for rash. Neurological: Negative for headaches. No focal numbness, tingling or weakness.     ____________________________________________   PHYSICAL EXAM:  VITAL SIGNS: ED Triage Vitals  Enc Vitals Group     BP 07/11/18 1559 101/80     Pulse Rate 07/11/18 1559 63     Resp 07/11/18 1559 16     Temp 07/11/18 1559 97.8 F (36.6 C)     Temp Source 07/11/18 1559 Oral     SpO2 07/11/18 1559 (!) 88 %     Weight 07/11/18 1605 178 lb 12.7 oz (81.1 kg)     Height 07/11/18 1605 5\' 7"  (1.702 m)     Head Circumference --      Peak Flow --      Pain Score 07/11/18 1605 0     Pain Loc --      Pain Edu? --      Excl. in [place]? --     Constitutional: Alert and oriented.  Answers questions appropriately.  Chronically ill-appearing. Eyes: Conjunctivae are normal.  EOMI. No scleral icterus. Head: Atraumatic. Nose: No congestion/rhinnorhea. Mouth/Throat: Mucous membranes are moist.  Neck: No stridor.  Supple.  No JVD.  No meningismus. Cardiovascular: Normal rate, regular rhythm. No murmurs, rubs or  gallops.  Respiratory: The patient is mildly tachypneic with some accessory muscle use but no retractions.  He is able to speak in full sentences without difficulty.  His O2 sats are in the mid 90s on 2 L nasal cannula.  He has no wheezes rales or rhonchi. Gastrointestinal: Soft, nontender and mildly distended.  No guarding or rebound.  No peritoneal signs. Musculoskeletal: No LE edema. No ttp in the calves or palpable cords.  Negative Homan's sign. Neurologic:  A&Ox3.  Speech is clear.  Face and smile are symmetric.  EOMI.  Moves all extremities well. Skin:  Skin is warm, dry and intact. No rash noted. Psychiatric: Mood and affect are normal.   ____________________________________________   LABS (all labs ordered are listed, but only abnormal results are displayed)  Labs Reviewed  BASIC METABOLIC PANEL - Abnormal; Notable for the following components:      Result Value   Calcium 8.7 (*)  All other components within normal limits  CULTURE, BLOOD (ROUTINE X 2)  CULTURE, BLOOD (ROUTINE X 2)  URINE CULTURE  CBC  LACTIC ACID, PLASMA  LACTIC ACID, PLASMA  BLOOD GAS, VENOUS  INFLUENZA PANEL BY PCR (TYPE A & B)  TROPONIN I  BRAIN NATRIURETIC PEPTIDE  URINALYSIS, ROUTINE W REFLEX MICROSCOPIC  CBC WITH DIFFERENTIAL/PLATELET   ____________________________________________  EKG  ED ECG REPORT I, Anne-Caroline Mariea Clonts, the attending physician, personally viewed and interpreted this ECG.   Date: 07/11/2018  EKG Time: 1609  Rate: 58  Rhythm: sinus bradycardia  Axis: normal  Intervals:none  ST&T Change: No STEMI  ____________________________________________  RADIOLOGY  Dg Chest 2 View  Result Date: 07/11/2018 CLINICAL DATA:  Cough, shortness of breath, and hypoxia. Near syncope. Smoker. EXAM: CHEST - 2 VIEW COMPARISON:  05/16/2017 FINDINGS: The cardiomediastinal silhouette is within normal limits for AP technique. Aortic atherosclerosis is noted. Lung volumes are lower than on  the prior study with mild central pulmonary vascular congestion and mild bibasilar atelectasis. There is mild chronic accentuation of the interstitial markings diffusely. No pleural effusion or pneumothorax is identified. No acute osseous abnormality is seen. IMPRESSION: Decreased lung volumes with mild bibasilar atelectasis. Electronically Signed   By: Logan Bores M.D.   On: 07/11/2018 18:10   Ct Chest W Contrast  Result Date: 07/11/2018 CLINICAL DATA:  Unexplained hypoxia EXAM: CT CHEST WITH CONTRAST TECHNIQUE: Multidetector CT imaging of the chest was performed during intravenous contrast administration. CONTRAST:  35mL OMNIPAQUE IOHEXOL 300 MG/ML  SOLN COMPARISON:  Chest two-view 07/11/2018 FINDINGS: Cardiovascular: Heart size normal. Moderate coronary calcification. Atherosclerotic disease aortic arch without aneurysm or dissection. Mediastinum/Nodes: Negative for mass or adenopathy Lungs/Pleura: Apical emphysema. Peripheral density right upper lobe posteriorly most compatible with scarring. Negative for pneumonia or effusion. No lung mass or nodule. Upper Abdomen: No acute abnormality.  Postop cholecystectomy Musculoskeletal: Degenerative changes throughout the thoracic spine. No acute skeletal abnormality. IMPRESSION: COPD with apical emphysema and probable scarring right upper lobe posteriorly. Negative for pneumonia or effusion Coronary calcification. Aortic Atherosclerosis (ICD10-I70.0). Electronically Signed   By: Franchot Gallo M.D.   On: 07/11/2018 19:49    ____________________________________________   PROCEDURES  Procedure(s) performed: None  Procedures  Critical Care performed: Yes, see critical care note(s) ____________________________________________   INITIAL IMPRESSION / ASSESSMENT AND PLAN / ED COURSE  Pertinent labs & imaging results that were available during my care of the patient were reviewed by me and considered in my medical decision making (see chart for  details).  58 y.o. male with a history of psychiatric illness ongoing tobacco abuse and COPD presenting with several days of cough, new oxygen requirement; hypotensive at his PCPs office.  Overall, I am concerned about sepsis in this patient and will evaluate him for pneumonia, UTI and bacteremia.  He will receive empiric antibiotics for community-acquired pneumonia.  A viral process, including coronavirus is possible, but the patient has not had any travel history or fever.  At this time he does not meet criteria for testing.  We will evaluate for influenza.  Plan reevaluation; the patient will require admission.  ----------------------------------------- 7:54 PM on 07/11/2018 -----------------------------------------  Patient CT scan does not show any focal abnormalities.  It is consistent with COPD.  At this time, I will admit the patient for COPD exacerbation with hypoxia.  He continues to be in stable condition at this time.  CRITICAL CARE Performed by: Eula Listen   Total critical care time: 30 minutes  Critical care time  was exclusive of separately billable procedures and treating other patients.  Critical care was necessary to treat or prevent imminent or life-threatening deterioration.  Critical care was time spent personally by me on the following activities: development of treatment plan with patient and/or surrogate as well as nursing, discussions with consultants, evaluation of patient's response to treatment, examination of patient, obtaining history from patient or surrogate, ordering and performing treatments and interventions, ordering and review of laboratory studies, ordering and review of radiographic studies, pulse oximetry and re-evaluation of patient's condition.   ____________________________________________  FINAL CLINICAL IMPRESSION(S) / ED DIAGNOSES  Final diagnoses:  COPD exacerbation ([place])  Hypoxia         NEW MEDICATIONS STARTED DURING THIS  VISIT:  New Prescriptions   No medications on file      Eula Listen, MD 07/11/18 1707    Eula Listen, MD 07/11/18 1954    Eula Listen, MD 07/11/18 0258

## 2018-07-11 NOTE — ED Notes (Signed)
Patient placed on 2L [place]. Oxygen saturation increased to 94% at this time.

## 2018-07-11 NOTE — ED Notes (Signed)
ED TO INPATIENT HANDOFF REPORT  ED Nurse Name and Phone #: Jenny Reichmann [phone]  S Name/Age/Gender Christopher Mason 58 y.o. male Room/Bed: ED14A/ED14A  Code Status   Code Status: Prior  Home/SNF/Other [place] Patient oriented to: self, place, time and situation Is this baseline? Yes   Triage Complete: Triage complete  Chief Complaint shortness of breath/EMS  Triage Note Patient sent from PCP via [facility]. Per EMS, patient was found to be SOB and hypoxic at PCP. Patient was given albuterol treatment and was going to leave to go home when he had near syncopal episode. EMS reports their RA oxygen sat upon arrival was 85%. Patient also hypotensive with SBP of 74. Patient reports he has had cough, body aches and loose stool for several days at home. Unsure of fevers. Patient denies home oxygen use. Reports history of COPD. Patient states he lives in boarding home and is always around people but denies anyone with recent illness.  Given 125 mg solumedrol, 1 duoneb, 1 albuterol by EMS.    Allergies Allergies  Allergen Reactions  . Aspirin Nausea And Vomiting and Swelling    Level of Care/Admitting Diagnosis ED Disposition    ED Disposition Condition [facility] Area: [place] [[id number]]  Level of Care: Med-Surg [16]  Diagnosis: COPD exacerbation [facility]) [[id number]]  Admitting Physician: Lance Coon [[id number]]  Attending Physician: Jannifer Franklin, DAVID [phone]  Estimated length of stay: past midnight tomorrow  Certification:: I certify this patient will need inpatient services for at least 2 midnights  PT Class (Do Not Modify): Inpatient [101]  PT Acc Code (Do Not Modify): Private [1]       B Medical/Surgery History Past Medical History:  Diagnosis Date  . Acute kidney injury ([place]) 11/20/2014  . ARF (acute renal failure) ([place]) 11/06/2014  . Asthma   . Bipolar disorder ([place])   . COPD (chronic obstructive pulmonary disease) ([place])   . Depression    . Gastritis 02/07/2015  . GERD (gastroesophageal reflux disease)   . GI bleeding 09/05/2015  . HLD (hyperlipidemia)   . Hypertension   . Hypokalemia 11/06/2014  . Hyponatremia 02/07/2015  . Hypotension 11/06/2014  . Hypothyroid   . Irritable bowel syndrome (IBS)   . Non compliance w medication regimen 09/16/2014  . Schizophrenia [facility])    Past Surgical History:  Procedure Laterality Date  . BACK SURGERY    . CARPAL TUNNEL RELEASE Bilateral   . CHOLECYSTECTOMY    . ESOPHAGOGASTRODUODENOSCOPY (EGD) WITH PROPOFOL N/A 09/06/2015   Procedure: ESOPHAGOGASTRODUODENOSCOPY (EGD) WITH PROPOFOL;  Surgeon: Hulen Luster, MD;  Location: [facility] ENDOSCOPY;  Service: Endoscopy;  Laterality: N/A;  . ROTATOR CUFF REPAIR     2007     A IV Location/Drains/Wounds Patient Lines/Drains/Airways Status   Active Line/Drains/Airways    Name:   Placement date:   Placement time:   Site:   Days:   Peripheral IV 07/11/18 Left Antecubital   07/11/18    1640    Antecubital   less than 1   Peripheral IV 07/11/18   07/11/18    1630    -   less than 1          Intake/Output Last 24 hours  Intake/Output Summary (Last 24 hours) at 07/11/2018 2049 Last data filed at 07/11/2018 1832 Gross per 24 hour  Intake 350 ml  Output -  Net 350 ml    Labs/Imaging Results for orders placed or performed during the hospital encounter of 07/11/18 (  from the past 48 hour(s))  Basic metabolic panel     Status: Abnormal   Collection Time: 07/11/18  4:47 PM  Result Value Ref Range   Sodium 138 135 - 145 mmol/L   Potassium 4.5 3.5 - 5.1 mmol/L   Chloride 102 98 - 111 mmol/L   CO2 27 22 - 32 mmol/L   Glucose, Bld 93 70 - 99 mg/dL   BUN 20 6 - 20 mg/dL   Creatinine, Ser 1.20 0.61 - 1.24 mg/dL   Calcium 8.7 (L) 8.9 - 10.3 mg/dL   GFR calc non Af Amer >60 >60 mL/min   GFR calc Af Amer >60 >60 mL/min   Anion gap 9 5 - 15    Comment: Performed at [facility], [address] [place], [place] [address]  CBC     Status:  None   Collection Time: 07/11/18  4:47 PM  Result Value Ref Range   WBC 6.1 4.0 - 10.5 K/uL   RBC 4.66 4.22 - 5.81 MIL/uL   Hemoglobin 13.2 13.0 - 17.0 g/dL   HCT 42.1 39.0 - 52.0 %   MCV 90.3 80.0 - 100.0 fL   MCH 28.3 26.0 - 34.0 pg   MCHC 31.4 30.0 - 36.0 g/dL   RDW 14.6 11.5 - 15.5 %   Platelets 228 150 - 400 K/uL   nRBC 0.0 0.0 - 0.2 %    Comment: Performed at [facility], [address] [place], [place] [address]  Lactic acid, plasma     Status: None   Collection Time: 07/11/18  4:47 PM  Result Value Ref Range   Lactic Acid, Venous 1.0 0.5 - 1.9 mmol/L    Comment: Performed at [facility], [address] [place], [place] [address]  Brain natriuretic peptide     Status: None   Collection Time: 07/11/18  4:47 PM  Result Value Ref Range   B Natriuretic Peptide 24.0 0.0 - 100.0 pg/mL    Comment: Performed at [facility], [address] [place], [place] [address]  Blood gas, venous ([facility], [facility], [facility])     Status: None   Collection Time: 07/11/18  5:20 PM  Result Value Ref Range   pH, Ven 7.36 7.250 - 7.430   pCO2, Ven 47 44.0 - 60.0 mmHg   pO2, Ven 32.0 32.0 - 45.0 mmHg    Comment: VENOUS   Bicarbonate 26.6 20.0 - 28.0 mmol/L   Acid-Base Excess 0.6 0.0 - 2.0 mmol/L   O2 Saturation 58.6 %   Patient temperature 37.0    Collection site VEIN    Sample type VENOUS     Comment: Performed at [facility], [address], [place], [place] [address]  Influenza panel by PCR (type A & B)     Status: None   Collection Time: 07/11/18  5:20 PM  Result Value Ref Range   Influenza A By PCR NEGATIVE NEGATIVE   Influenza B By PCR NEGATIVE NEGATIVE    Comment: (NOTE) The Xpert Xpress Flu assay is intended as an aid in the diagnosis of  influenza and should not be used as a sole basis for treatment.  This  assay is FDA approved for nasopharyngeal swab specimens only. Nasal  washings and aspirates are unacceptable for Xpert Xpress Flu  testing. Performed at [facility], [place], [place] [address]   Lactic acid, plasma     Status: None   Collection Time: 07/11/18  7:00 PM  Result Value Ref Range   Lactic Acid, Venous 0.7 0.5 - 1.9 mmol/L    Comment: Performed at [facility], [address] [place], [place] [address]  Troponin I - ONCE - STAT     Status: None   Collection Time: 07/11/18  7:00 PM  Result Value Ref Range   Troponin I <0.03 <0.03 ng/mL    Comment: Performed at [facility], [address] [place], [place] [address]   Dg Chest 2 View  Result Date: 07/11/2018 CLINICAL DATA:  Cough, shortness of breath, and hypoxia. Near syncope. Smoker. EXAM: CHEST - 2 VIEW COMPARISON:  05/16/2017 FINDINGS: The cardiomediastinal silhouette is within normal limits for AP technique. Aortic atherosclerosis is noted. Lung volumes are lower than on the prior study with mild central pulmonary vascular congestion and mild bibasilar atelectasis. There is mild chronic accentuation of the interstitial markings diffusely. No pleural effusion or pneumothorax is identified. No acute osseous abnormality is seen. IMPRESSION: Decreased lung volumes with mild bibasilar atelectasis. Electronically Signed   By: Logan Bores M.D.   On: 07/11/2018 18:10   Ct Chest W Contrast  Addendum Date: 07/11/2018   ADDENDUM REPORT: 07/11/2018 19:59 ADDENDUM: Diffuse bronchial wall thickening upper and lower lobes. Bronchial diameter mildly dilated. Findings suggestive of bronchiectasis. Electronically Signed   By: Franchot Gallo M.D.   On: 07/11/2018 19:59   Result Date: 07/11/2018 CLINICAL DATA:  Unexplained hypoxia EXAM: CT CHEST WITH CONTRAST TECHNIQUE: Multidetector CT imaging of the chest was performed during intravenous contrast administration. CONTRAST:  77mL OMNIPAQUE IOHEXOL 300 MG/ML  SOLN COMPARISON:  Chest two-view 07/11/2018 FINDINGS: Cardiovascular: Heart size normal. Moderate coronary calcification.  Atherosclerotic disease aortic arch without aneurysm or dissection. Mediastinum/Nodes: Negative for mass or adenopathy Lungs/Pleura: Apical emphysema. Peripheral density right upper lobe posteriorly most compatible with scarring. Negative for pneumonia or effusion. No lung mass or nodule. Upper Abdomen: No acute abnormality.  Postop cholecystectomy Musculoskeletal: Degenerative changes throughout the thoracic spine. No acute skeletal abnormality. IMPRESSION: COPD with apical emphysema and probable scarring right upper lobe posteriorly. Negative for pneumonia or effusion Coronary calcification. Aortic Atherosclerosis (ICD10-I70.0). Electronically Signed: By: Franchot Gallo M.D. On: 07/11/2018 19:49    Pending Labs Unresulted Labs (From admission, onward)    Start     Ordered   07/11/18 2027  Procalcitonin - Baseline  Add-on,   AD     07/11/18 2026   07/11/18 1904  CBC with Differential  Add-on,   AD     07/11/18 1903   07/11/18 1659  Blood Culture (routine x 2)  BLOOD CULTURE X 2,   STAT     07/11/18 1659   07/11/18 1659  Urinalysis, Routine w reflex microscopic  ONCE - STAT,   STAT     07/11/18 1659   07/11/18 1659  Urine culture  ONCE - STAT,   STAT     07/11/18 1659   Signed and Held  HIV antibody (Routine Testing)  Once,   R     Signed and Held   Signed and Held  CBC  (enoxaparin (LOVENOX)    CrCl >/= 30 ml/min)  Once,   R    Comments:  Baseline for enoxaparin therapy IF NOT ALREADY DRAWN.  Notify MD if PLT < 100 K.    Signed and Held   Signed and Held  Creatinine, serum  (enoxaparin (LOVENOX)    CrCl >/= 30 ml/min)  Once,   R    Comments:  Baseline for enoxaparin  therapy IF NOT ALREADY DRAWN.    Signed and Held   Signed and Held  Creatinine, serum  (enoxaparin (LOVENOX)    CrCl >/= 30 ml/min)  Weekly,   R    Comments:  while on enoxaparin therapy    Signed and Held   Signed and Held  Basic metabolic panel  Tomorrow morning,   R     Signed and Held   Signed and Held  CBC  Tomorrow  morning,   R     Signed and Held          Vitals/Pain Today's Vitals   07/11/18 1730 07/11/18 1735 07/11/18 1825 07/11/18 1830  BP: 104/82  117/81 127/79  Pulse: 66 68 62 65  Resp: (!) 25 15 (!) 22 13  Temp:   (!) 97.5 F (36.4 C)   TempSrc:   Oral   SpO2: 93% 91% 94% 93%  Weight:      Height:      PainSc:        Isolation Precautions Droplet precaution  Medications Medications  cefTRIAXone (ROCEPHIN) 2 g in sodium chloride 0.9 % 100 mL IVPB (0 g Intravenous Stopped 07/11/18 1825)  azithromycin (ZITHROMAX) 500 mg in sodium chloride 0.9 % 250 mL IVPB (0 mg Intravenous Stopped 07/11/18 1832)  iohexol (OMNIPAQUE) 300 MG/ML solution 75 mL (75 mLs Intravenous Contrast Given 07/11/18 1929)    Mobility walks Low fall risk   Focused Assessments Pulmonary Assessment Handoff:  Lung sounds: Bilateral Breath Sounds: Expiratory wheezes L Breath Sounds: Expiratory wheezes R Breath Sounds: Expiratory wheezes O2 Device: Room Air O2 Flow Rate (L/min): 3 L/min      R Recommendations: See Admitting Provider Note  Report given to:   Additional Notes: none

## 2018-07-11 NOTE — Progress Notes (Signed)
Patient was outside in parking lot and got light headed and dizzy and fell, patient came back into office himself and brought back to room and his oxygen level was back down to 84 , put patient back on oxygen and called 911.  Patient on 3 liter of oxygen stat at 90%

## 2018-07-11 NOTE — Progress Notes (Signed)
[facility] [place], [place] [address]  Internal MEDICINE  Office Visit Note  Patient Name: Christopher Mason  [id number]  [id number]  Date of Service: 07/11/2018  Chief Complaint  Patient presents with  . Cough  . Fever  . Diarrhea  . Shortness of Breath  . Sore Throat     HPI Pt is here for a sick visit. Pt reports cough, fever, sob and sore throat for a week. He also reports having diarrhea 1-2 times a day this week.  He denies sick contacts.  His room air saturation is 85% initially.  He has audible wheezing while walking to the room. He reports his appetite has been low and he has not been eating or drinking much.  He has been drinking water.  He has been using his inhaler more than normal this week.      UPDATE*  As patient is exiting office he fell.  He was short of breath, sating 72% on room air. He was also dizzy.  Called 911 for patient to be transported to ED.     Current Medication:  Outpatient Encounter Medications as of 07/11/2018  Medication Sig  . albuterol (PROVENTIL HFA;VENTOLIN HFA) 108 (90 BASE) MCG/ACT inhaler Inhale 2 puffs into the lungs every 4 (four) hours as needed for wheezing or shortness of breath.  Marland Kitchen amitriptyline (ELAVIL) 50 MG tablet Take 1 tablet (50 mg total) by mouth at bedtime.  Marland Kitchen amLODipine (NORVASC) 10 MG tablet Take 1 tablet (10 mg total) by mouth daily.  Marland Kitchen docusate sodium (COLACE) 100 MG capsule Take 1 capsule (100 mg total) by mouth daily.  Marland Kitchen levothyroxine (SYNTHROID) 200 MCG tablet Take 1 tablet (200 mcg total) by mouth daily before breakfast.  . lisinopril (PRINIVIL,ZESTRIL) 40 MG tablet Take one tablet (40mg  total) by mouth daily  . loperamide (IMODIUM A-D) 2 MG tablet Take 1 tablet (2 mg total) by mouth 4 (four) times daily as needed for diarrhea or loose stools.  . pantoprazole (PROTONIX) 40 MG tablet Take 1 tablet (40 mg total) by mouth daily.  . pravastatin (PRAVACHOL) 10 MG tablet Take 1 tablet (10 mg  total) by mouth daily.  . propranolol (INDERAL) 10 MG tablet Take one tablet (10mg  total) by mouth three times a day.  . senna (SENOKOT) 8.6 MG TABS tablet Take 1 tablet (8.6 mg total) by mouth daily.  . solifenacin (VESICARE) 10 MG tablet Take 1 tab by mouth nightly for overactive bladder  . traMADol (ULTRAM) 50 MG tablet Take 1 tablet (50 mg total) by mouth every 6 (six) hours as needed.  . traZODone (DESYREL) 100 MG tablet Take 100 mg by mouth at bedtime.  Marland Kitchen venlafaxine XR (EFFEXOR-XR) 75 MG 24 hr capsule Take 1 capsule (75 mg total) by mouth daily with breakfast.  . azithromycin (ZITHROMAX) 250 MG tablet Take as directed  . ipratropium-albuterol (DUONEB) 0.5-2.5 (3) MG/3ML SOLN Take 3 mLs by nebulization every 6 (six) hours as needed.  . paliperidone (INVEGA SUSTENNA) 234 MG/1.5ML SUSP injection Inject 234 mg into the muscle once for 1 dose. Next dose due 08/13/17  . predniSONE (DELTASONE) 10 MG tablet Use per dose pack  . [EXPIRED] ipratropium-albuterol (DUONEB) 0.5-2.5 (3) MG/3ML nebulizer solution 3 mL   . [EXPIRED] methylPREDNISolone acetate (DEPO-MEDROL) injection 80 mg    No facility-administered encounter medications on file as of 07/11/2018.       Medical History: Past Medical History:  Diagnosis Date  . Acute kidney injury ([place]) 11/20/2014  .  ARF (acute renal failure) ([place]) 11/06/2014  . Asthma   . Bipolar disorder ([place])   . COPD (chronic obstructive pulmonary disease) ([place])   . Depression   . Gastritis 02/07/2015  . GERD (gastroesophageal reflux disease)   . GI bleeding 09/05/2015  . HLD (hyperlipidemia)   . Hypertension   . Hypokalemia 11/06/2014  . Hyponatremia 02/07/2015  . Hypotension 11/06/2014  . Hypothyroid   . Irritable bowel syndrome (IBS)   . Non compliance w medication regimen 09/16/2014  . Schizophrenia ([place])      Vital Signs: BP 92/68   Pulse 69   Temp 97.8 F (36.6 C)   Resp (!) 22   Ht 5\' 7"  (1.702 m)   Wt 179 lb (81.2 kg)   SpO2 91% Comment: 3  liter  BMI 28.04 kg/m    Review of Systems  Constitutional: Negative.  Negative for chills, fatigue and unexpected weight change.  HENT: Negative.  Negative for congestion, rhinorrhea, sneezing and sore throat.   Eyes: Negative for redness.  Respiratory: Negative.  Negative for cough, chest tightness and shortness of breath.   Cardiovascular: Negative.  Negative for chest pain and palpitations.  Gastrointestinal: Negative.  Negative for abdominal pain, constipation, diarrhea, nausea and vomiting.  Endocrine: Negative.   Genitourinary: Negative.  Negative for dysuria and frequency.  Musculoskeletal: Negative.  Negative for arthralgias, back pain, joint swelling and neck pain.  Skin: Negative.  Negative for rash.  Allergic/Immunologic: Negative.   Neurological: Negative.  Negative for tremors and numbness.  Hematological: Negative for adenopathy. Does not bruise/bleed easily.  Psychiatric/Behavioral: Negative.  Negative for behavioral problems, sleep disturbance and suicidal ideas. The patient is not nervous/anxious.     Physical Exam Vitals signs and nursing note reviewed.  Constitutional:      General: He is not in acute distress.    Appearance: He is well-developed. He is not diaphoretic.  HENT:     Head: Normocephalic and atraumatic.     Mouth/Throat:     Pharynx: No oropharyngeal exudate.  Eyes:     Pupils: Pupils are equal, round, and reactive to light.  Neck:     Musculoskeletal: Normal range of motion and neck supple.     Thyroid: No thyromegaly.     Vascular: No JVD.     Trachea: No tracheal deviation.  Cardiovascular:     Rate and Rhythm: Normal rate and regular rhythm.     Heart sounds: Normal heart sounds. No murmur. No friction rub. No gallop.   Pulmonary:     Effort: Pulmonary effort is normal. No respiratory distress.     Breath sounds: Normal breath sounds. No wheezing or rales.  Chest:     Chest wall: No tenderness.  Abdominal:     Palpations: Abdomen is  soft.     Tenderness: There is no abdominal tenderness. There is no guarding.  Musculoskeletal: Normal range of motion.  Lymphadenopathy:     Cervical: No cervical adenopathy.  Skin:    General: Skin is warm and dry.  Neurological:     Mental Status: He is alert and oriented to person, place, and time.     Cranial Nerves: No cranial nerve deficit.  Psychiatric:        Behavior: Behavior normal.        Thought Content: Thought content normal.        Judgment: Judgment normal.    Assessment/Plan: 1. Respiratory infection Take Zpak and prednisone as directed.  Use duonebs as ordered.  -  ipratropium-albuterol (DUONEB) 0.5-2.5 (3) MG/3ML nebulizer solution 3 mL - methylPREDNISolone acetate (DEPO-MEDROL) injection 80 mg - azithromycin (ZITHROMAX) 250 MG tablet; Take as directed  Dispense: 6 tablet; Refill: 0 - predniSONE (DELTASONE) 10 MG tablet; Use per dose pack  Dispense: 21 tablet; Refill: 0  2. Hypoxia *85 on room air.  90% on 2 liters. Improved to 95% after duobeb  3. Diarrhea, unspecified type *1-2 episodes daily.   4. Sore throat Flu negative. - POCT Influenza A/B  5. Wheezing Pt has intermittent wheezing, became better aaft - predniSONE (DELTASONE) 10 MG tablet; Use per dose pack  Dispense: 21 tablet; Refill: 0  6. Mild intermittent asthma, unspecified whether complicated - For home use only DME Nebulizer machine  General Counseling: Christopher Mason verbalizes understanding of the findings of todays visit and agrees with plan of treatment. I have discussed any further diagnostic evaluation that may be needed or ordered today. We also reviewed his medications today. he has been encouraged to call the office with any questions or concerns that should arise related to todays visit.   Orders Placed This Encounter  Procedures  . For home use only DME Nebulizer machine  . POCT Influenza A/B    Meds ordered this encounter  Medications  . ipratropium-albuterol (DUONEB) 0.5-2.5  (3) MG/3ML nebulizer solution 3 mL  . methylPREDNISolone acetate (DEPO-MEDROL) injection 80 mg  . azithromycin (ZITHROMAX) 250 MG tablet    Sig: Take as directed    Dispense:  6 tablet    Refill:  0  . predniSONE (DELTASONE) 10 MG tablet    Sig: Use per dose pack    Dispense:  21 tablet    Refill:  0  . ipratropium-albuterol (DUONEB) 0.5-2.5 (3) MG/3ML SOLN    Sig: Take 3 mLs by nebulization every 6 (six) hours as needed.    Dispense:  360 mL    Refill:  1    Time spent: 30 Minutes  This patient was seen by Orson Gear AGNP-C in Collaboration with Dr Lavera Guise as a part of collaborative care agreement.  Kendell Bane AGNP-C Internal Medicine

## 2018-07-11 NOTE — Telephone Encounter (Signed)
[facility] called they send pt here for sick visit I advised we send him to the [facility] [place]

## 2018-07-11 NOTE — H&P (Signed)
[facility] [place] NAME: Christopher Mason    MR#:  [id number]  DATE OF BIRTH:  Jun 05, 1960  DATE OF ADMISSION:  07/11/2018  PRIMARY CARE PHYSICIAN: Kendell Bane, NP   REQUESTING/REFERRING PHYSICIAN: Mariea Clonts, MD  CHIEF COMPLAINT:   Chief Complaint  Patient presents with  . Shortness of Breath  . Cough  . Near Syncope    HISTORY OF PRESENT ILLNESS:  Christopher Mason  is a 58 y.o. male who presents with chief complaint as above.  Patient presents to the ED with a complaint of increased cough, shortness of breath, wheezing.  He was seen at his primary care physician's office and was found to be hypoxic.  Here in the ED imaging and lab testing are consistent with COPD exacerbation.  Hospitalist were called for admission and further treatment  PAST MEDICAL HISTORY:   Past Medical History:  Diagnosis Date  . Acute kidney injury ([place]) 11/20/2014  . ARF (acute renal failure) ([place]) 11/06/2014  . Asthma   . Bipolar disorder ([place])   . COPD (chronic obstructive pulmonary disease) ([place])   . Depression   . Gastritis 02/07/2015  . GERD (gastroesophageal reflux disease)   . GI bleeding 09/05/2015  . HLD (hyperlipidemia)   . Hypertension   . Hypokalemia 11/06/2014  . Hyponatremia 02/07/2015  . Hypotension 11/06/2014  . Hypothyroid   . Irritable bowel syndrome (IBS)   . Non compliance w medication regimen 09/16/2014  . Schizophrenia ([place])      PAST SURGICAL HISTORY:   Past Surgical History:  Procedure Laterality Date  . BACK SURGERY    . CARPAL TUNNEL RELEASE Bilateral   . CHOLECYSTECTOMY    . ESOPHAGOGASTRODUODENOSCOPY (EGD) WITH PROPOFOL N/A 09/06/2015   Procedure: ESOPHAGOGASTRODUODENOSCOPY (EGD) WITH PROPOFOL;  Surgeon: Hulen Luster, MD;  Location[facility] ENDOSCOPY;  Service: Endoscopy;  Laterality: N/A;  . ROTATOR CUFF REPAIR     2007     SOCIAL HISTORY:   Social History   Tobacco Use  . Smoking status: Current Every Day Smoker     Packs/day: 1.00    Types: Cigarettes  . Smokeless tobacco: Never Used  Substance Use Topics  . Alcohol use: No     FAMILY HISTORY:   Family History  Problem Relation Age of Onset  . Hypertension Father   . Cataracts Father   . Diabetes Sister   . Diabetes Brother   . Dementia Mother      DRUG ALLERGIES:   Allergies  Allergen Reactions  . Aspirin Nausea And Vomiting and Swelling    MEDICATIONS AT HOME:   Prior to Admission medications   Medication Sig Start Date End Date Taking? Authorizing Provider  albuterol (PROVENTIL HFA;VENTOLIN HFA) 108 (90 BASE) MCG/ACT inhaler Inhale 2 puffs into the lungs every 4 (four) hours as needed for wheezing or shortness of breath. 09/25/14   Pucilowska, Jolanta B, MD  amitriptyline (ELAVIL) 50 MG tablet Take 1 tablet (50 mg total) by mouth at bedtime. 07/23/17   McNew, Tyson Babinski, MD  amLODipine (NORVASC) 10 MG tablet Take 1 tablet (10 mg total) by mouth daily. 03/29/18   Kendell Bane, NP  azithromycin (ZITHROMAX) 250 MG tablet Take as directed 07/11/18   Kendell Bane, NP  docusate sodium (COLACE) 100 MG capsule Take 1 capsule (100 mg total) by mouth daily. 06/28/18   Scarboro, Audie Clear, NP  ipratropium-albuterol (DUONEB) 0.5-2.5 (3) MG/3ML SOLN Take 3 mLs by nebulization every 6 (six)  hours as needed. 07/11/18   Kendell Bane, NP  levothyroxine (SYNTHROID) 200 MCG tablet Take 1 tablet (200 mcg total) by mouth daily before breakfast. 06/28/18   Kendell Bane, NP  lisinopril (PRINIVIL,ZESTRIL) 40 MG tablet Take one tablet (40mg  total) by mouth daily 03/01/18   Scarboro, Audie Clear, NP  loperamide (IMODIUM A-D) 2 MG tablet Take 1 tablet (2 mg total) by mouth 4 (four) times daily as needed for diarrhea or loose stools. 07/03/18   Earleen Newport, MD  paliperidone (INVEGA SUSTENNA) 234 MG/1.5ML SUSP injection Inject 234 mg into the muscle once for 1 dose. Next dose due 08/13/17 08/13/17 08/13/17  McNew, Tyson Babinski, MD  pantoprazole (PROTONIX) 40 MG tablet  Take 1 tablet (40 mg total) by mouth daily. 02/28/18   Ronnell Freshwater, NP  pravastatin (PRAVACHOL) 10 MG tablet Take 1 tablet (10 mg total) by mouth daily. 03/29/18   Kendell Bane, NP  predniSONE (DELTASONE) 10 MG tablet Use per dose pack 07/11/18   [place], Audie Clear, NP  propranolol (INDERAL) 10 MG tablet Take one tablet (10mg  total) by mouth three times a day. 03/01/18   Kendell Bane, NP  senna (SENOKOT) 8.6 MG TABS tablet Take 1 tablet (8.6 mg total) by mouth daily. 01/07/18   Kendell Bane, NP  solifenacin (VESICARE) 10 MG tablet Take 1 tab by mouth nightly for overactive bladder 03/01/18   Kendell Bane, NP  traMADol (ULTRAM) 50 MG tablet Take 1 tablet (50 mg total) by mouth every 6 (six) hours as needed. 07/03/18 07/03/19  Earleen Newport, MD  traZODone (DESYREL) 100 MG tablet Take 100 mg by mouth at bedtime.    [provider]  venlafaxine XR (EFFEXOR-XR) 75 MG 24 hr capsule Take 1 capsule (75 mg total) by mouth daily with breakfast. 07/24/17   McNew, Tyson Babinski, MD    REVIEW OF SYSTEMS:  Review of Systems  Constitutional: Negative for chills, fever, malaise/fatigue and weight loss.  HENT: Negative for ear pain, hearing loss and tinnitus.   Eyes: Negative for blurred vision, double vision, pain and redness.  Respiratory: Positive for cough, shortness of breath and wheezing. Negative for hemoptysis.   Cardiovascular: Negative for chest pain, palpitations, orthopnea and leg swelling.  Gastrointestinal: Negative for abdominal pain, constipation, diarrhea, nausea and vomiting.  Genitourinary: Negative for dysuria, frequency and hematuria.  Musculoskeletal: Negative for back pain, joint pain and neck pain.  Skin:       No acne, rash, or lesions  Neurological: Negative for dizziness, tremors, focal weakness and weakness.  Endo/Heme/Allergies: Negative for polydipsia. Does not bruise/bleed easily.  Psychiatric/Behavioral: Negative for depression. The patient is not  nervous/anxious and does not have insomnia.      VITAL SIGNS:   Vitals:   07/11/18 1730 07/11/18 1735 07/11/18 1825 07/11/18 1830  BP: 104/82  117/81 127/79  Pulse: 66 68 62 65  Resp: (!) 25 15 (!) 22 13  Temp:   (!) 97.5 F (36.4 C)   TempSrc:   Oral   SpO2: 93% 91% 94% 93%  Weight:      Height:       Wt Readings from Last 3 Encounters:  07/11/18 81.1 kg  07/11/18 81.2 kg  07/03/18 83.5 kg    PHYSICAL EXAMINATION:  Physical Exam  Vitals reviewed. Constitutional: He is oriented to person, place, and time. He appears well-developed and well-nourished. No distress.  HENT:  Head: Normocephalic and atraumatic.  Mouth/Throat: Oropharynx is clear and  moist.  Eyes: Pupils are equal, round, and reactive to light. Conjunctivae and EOM are normal. No scleral icterus.  Neck: Normal range of motion. Neck supple. No JVD present. No thyromegaly present.  Cardiovascular: Normal rate, regular rhythm and intact distal pulses. Exam reveals no gallop and no friction rub.  No murmur heard. Respiratory: Effort normal. No respiratory distress. He has wheezes. He has no rales.  GI: Soft. Bowel sounds are normal. He exhibits no distension. There is no abdominal tenderness.  Musculoskeletal: Normal range of motion.        General: No edema.     Comments: No arthritis, no gout  Lymphadenopathy:    He has no cervical adenopathy.  Neurological: He is alert and oriented to person, place, and time. No cranial nerve deficit.  No dysarthria, no aphasia  Skin: Skin is warm and dry. No rash noted. No erythema.  Psychiatric: He has a normal mood and affect. His behavior is normal. Judgment and thought content normal.    LABORATORY PANEL:   CBC Recent Labs  Lab 07/11/18 1647  WBC 6.1  HGB 13.2  HCT 42.1  PLT 228   ------------------------------------------------------------------------------------------------------------------  Chemistries  Recent Labs  Lab 07/11/18 1647  NA 138  K 4.5   CL 102  CO2 27  GLUCOSE 93  BUN 20  CREATININE 1.20  CALCIUM 8.7*   ------------------------------------------------------------------------------------------------------------------  Cardiac Enzymes Recent Labs  Lab 07/11/18 1900  TROPONINI <0.03   ------------------------------------------------------------------------------------------------------------------  RADIOLOGY:  Dg Chest 2 View  Result Date: 07/11/2018 CLINICAL DATA:  Cough, shortness of breath, and hypoxia. Near syncope. Smoker. EXAM: CHEST - 2 VIEW COMPARISON:  05/16/2017 FINDINGS: The cardiomediastinal silhouette is within normal limits for AP technique. Aortic atherosclerosis is noted. Lung volumes are lower than on the prior study with mild central pulmonary vascular congestion and mild bibasilar atelectasis. There is mild chronic accentuation of the interstitial markings diffusely. No pleural effusion or pneumothorax is identified. No acute osseous abnormality is seen. IMPRESSION: Decreased lung volumes with mild bibasilar atelectasis. Electronically Signed   By: Logan Bores M.D.   On: 07/11/2018 18:10   Ct Chest W Contrast  Addendum Date: 07/11/2018   ADDENDUM REPORT: 07/11/2018 19:59 ADDENDUM: Diffuse bronchial wall thickening upper and lower lobes. Bronchial diameter mildly dilated. Findings suggestive of bronchiectasis. Electronically Signed   By: Franchot Gallo M.D.   On: 07/11/2018 19:59   Result Date: 07/11/2018 CLINICAL DATA:  Unexplained hypoxia EXAM: CT CHEST WITH CONTRAST TECHNIQUE: Multidetector CT imaging of the chest was performed during intravenous contrast administration. CONTRAST:  39mL OMNIPAQUE IOHEXOL 300 MG/ML  SOLN COMPARISON:  Chest two-view 07/11/2018 FINDINGS: Cardiovascular: Heart size normal. Moderate coronary calcification. Atherosclerotic disease aortic arch without aneurysm or dissection. Mediastinum/Nodes: Negative for mass or adenopathy Lungs/Pleura: Apical emphysema. Peripheral density  right upper lobe posteriorly most compatible with scarring. Negative for pneumonia or effusion. No lung mass or nodule. Upper Abdomen: No acute abnormality.  Postop cholecystectomy Musculoskeletal: Degenerative changes throughout the thoracic spine. No acute skeletal abnormality. IMPRESSION: COPD with apical emphysema and probable scarring right upper lobe posteriorly. Negative for pneumonia or effusion Coronary calcification. Aortic Atherosclerosis (ICD10-I70.0). Electronically Signed: By: Franchot Gallo M.D. On: 07/11/2018 19:49    EKG:   Orders placed or performed during the hospital encounter of 07/11/18  . ED EKG  . ED EKG  . ED EKG 12-Lead  . ED EKG 12-Lead  . EKG 12-Lead  . EKG 12-Lead    IMPRESSION AND PLAN:  Principal Problem:  COPD exacerbation (HCC) -no pneumonia or infiltrates seen on imaging, white blood cell count is not elevated, pro calcitonin is negative.  Work-up consistent with COPD exacerbation.  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 Labs, imaging and/or ECG reviewed by provider and discussed with patient/family. Management plans discussed with the patient and/or family.  DVT PROPHYLAXIS: SubQ lovenox   GI PROPHYLAXIS:  PPI   ADMISSION STATUS: Inpatient     CODE STATUS: Full Code Status History    Date Active Date Inactive Code Status Order ID Comments User Context   07/03/2017 1615 07/23/2017 1321 Full Code [id number]  Gonzella Lex, MD Inpatient   09/05/2015 0516 09/06/2015 2022 Full Code [id number]  Saundra Shelling, MD Inpatient   02/07/2015 0621 02/07/2015 1440 Full Code  [id number]  Lance Coon, MD Inpatient   11/20/2014 0611 11/21/2014 1522 Full Code [id number]  Harrie Foreman, MD Inpatient   11/06/2014 1741 11/08/2014 1535 Full Code [id number]  Demetrios Loll, MD Inpatient   09/17/2014 0158 09/29/2014 1811 Full Code [id number]  Clapacs, Madie Reno, MD Inpatient      TOTAL TIME TAKING CARE OF THIS PATIENT: 45 minutes.   Ethlyn Daniels 07/11/2018, 8:34 PM  [facility]  Office  [phone]  CC: Primary care physician; Kendell Bane, NP  Note:  This document was prepared using Dragon voice recognition software and may include unintentional dictation errors.

## 2018-07-11 NOTE — Consult Note (Addendum)
CODE SEPSIS - PHARMACY COMMUNICATION  **Broad Spectrum Antibiotics should be administered within 1 hour of Sepsis diagnosis**  Time Code Sepsis Called/Page Received: 1658  Antibiotics Ordered: Ceftriaxone, Azithromycin  Time of 1st antibiotic administration: 1723  Additional action taken by pharmacy: none currently needed  If necessary, Name of Provider/Nurse Contacted: N/A    Pearla Dubonnet ,PharmD Clinical Pharmacist  07/11/2018  5:23 PM

## 2018-07-11 NOTE — ED Triage Notes (Addendum)
Patient sent from PCP via [facility]. Per EMS, patient was found to be SOB and hypoxic at PCP. Patient was given albuterol treatment and was going to leave to go home when he had near syncopal episode. EMS reports their RA oxygen sat upon arrival was 85%. Patient also hypotensive with SBP of 74. Patient reports he has had cough, body aches and loose stool for several days at home. Unsure of fevers. Patient denies home oxygen use. Reports history of COPD. Patient states he lives in boarding home and is always around people but denies anyone with recent illness.  Given 125 mg solumedrol, 1 duoneb, 1 albuterol by EMS.

## 2018-07-12 LAB — CBC
HCT: 41 % (ref 39.0–52.0)
Hemoglobin: 13.1 g/dL (ref 13.0–17.0)
MCH: 28.5 pg (ref 26.0–34.0)
MCHC: 32 g/dL (ref 30.0–36.0)
MCV: 89.1 fL (ref 80.0–100.0)
PLATELETS: 221 10*3/uL (ref 150–400)
RBC: 4.6 MIL/uL (ref 4.22–5.81)
RDW: 14.4 % (ref 11.5–15.5)
WBC: 5.6 10*3/uL (ref 4.0–10.5)
nRBC: 0 % (ref 0.0–0.2)

## 2018-07-12 LAB — URINALYSIS, ROUTINE W REFLEX MICROSCOPIC
Bilirubin Urine: NEGATIVE
Glucose, UA: NEGATIVE mg/dL
Hgb urine dipstick: NEGATIVE
Ketones, ur: 5 mg/dL — AB
LEUKOCYTE UA: NEGATIVE
NITRITE: NEGATIVE
Protein, ur: NEGATIVE mg/dL
Specific Gravity, Urine: >1.046 — ABNORMAL HIGH (ref 1.005–1.030)
pH: 5 (ref 5.0–8.0)

## 2018-07-12 LAB — BASIC METABOLIC PANEL
Anion gap: 6 (ref 5–15)
BUN: 19 mg/dL (ref 6–20)
CALCIUM: 9.4 mg/dL (ref 8.9–10.3)
CO2: 25 mmol/L (ref 22–32)
Chloride: 106 mmol/L (ref 98–111)
Creatinine, Ser: 0.76 mg/dL (ref 0.61–1.24)
GFR calc non Af Amer: >60 mL/min (ref >60)
Glucose, Bld: 223 mg/dL — ABNORMAL HIGH (ref 70–99)
POTASSIUM: 4.5 mmol/L (ref 3.5–5.1)
Sodium: 137 mmol/L (ref 135–145)

## 2018-07-12 MED ORDER — ORAL CARE MOUTH RINSE
15.0000 mL | Freq: Two times a day (BID) | OROMUCOSAL | Status: DC
  Administered 2018-07-12 – 2018-07-13 (×2): 15 mL via OROMUCOSAL

## 2018-07-12 MED ORDER — METHYLPREDNISOLONE SODIUM SUCC 125 MG IJ SOLR
60.0000 mg | Freq: Two times a day (BID) | INTRAMUSCULAR | Status: DC
  Administered 2018-07-12 – 2018-07-14 (×4): 60 mg via INTRAVENOUS
  Filled 2018-07-12 (×4): qty 2

## 2018-07-12 MED ORDER — AZITHROMYCIN 500 MG PO TABS
500.0000 mg | ORAL_TABLET | Freq: Every day | ORAL | Status: AC
  Administered 2018-07-12 – 2018-07-13 (×2): 500 mg via ORAL
  Filled 2018-07-12 (×2): qty 1

## 2018-07-12 MED ORDER — SOLIFENACIN SUCCINATE 5 MG PO TABS
10.0000 mg | ORAL_TABLET | Freq: Every day | ORAL | Status: DC
  Filled 2018-07-12 (×2): qty 2

## 2018-07-12 MED ORDER — DARIFENACIN HYDROBROMIDE ER 7.5 MG PO TB24
15.0000 mg | ORAL_TABLET | Freq: Every day | ORAL | Status: DC
  Administered 2018-07-12 – 2018-07-14 (×3): 15 mg via ORAL
  Filled 2018-07-12 (×3): qty 1

## 2018-07-12 NOTE — Progress Notes (Signed)
Pt has punch packet home medications in room. Tramadol in single punch packets. Notified pharmacy and spoke with [facility].Rodena Piety stated medication can be left in room.

## 2018-07-12 NOTE — Plan of Care (Signed)
  Problem: Health Behavior/Discharge Planning: Goal: Ability to manage health-related needs will improve Outcome: Progressing   Problem: Clinical Measurements: Goal: Ability to maintain clinical measurements within normal limits will improve Outcome: Progressing Goal: Will remain free from infection Outcome: Progressing Goal: Diagnostic test results will improve Outcome: Progressing Goal: Respiratory complications will improve Outcome: Progressing   Problem: Activity: Goal: Risk for activity intolerance will decrease Outcome: Progressing   Problem: Nutrition: Goal: Adequate nutrition will be maintained Outcome: Progressing   Problem: Coping: Goal: Level of anxiety will decrease Outcome: Progressing   Problem: Elimination: Goal: Will not experience complications related to bowel motility Outcome: Progressing   Problem: Pain Managment: Goal: General experience of comfort will improve Outcome: Progressing   Problem: Skin Integrity: Goal: Risk for impaired skin integrity will decrease Outcome: Progressing   Problem: Education: Goal: Knowledge of disease or condition will improve Outcome: Progressing   Problem: Activity: Goal: Ability to tolerate increased activity will improve Outcome: Progressing   Problem: Respiratory: Goal: Ability to maintain a clear airway will improve Outcome: Progressing Goal: Levels of oxygenation will improve Outcome: Progressing

## 2018-07-12 NOTE — Progress Notes (Signed)
[place] [facility] [place] NAME: Christopher Mason    MR#:  [id number]  DATE OF BIRTH:  Mar 19, 1961  SUBJECTIVE:  CHIEF COMPLAINT:   Chief Complaint  Patient presents with  . Shortness of Breath  . Cough  . Near Syncope   Came with shortness of breath and noted to have COPD exacerbation.  Feels slightly better today. REVIEW OF SYSTEMS:  CONSTITUTIONAL: No fever, fatigue or weakness.  EYES: No blurred or double vision.  EARS, NOSE, AND THROAT: No tinnitus or ear pain.  RESPIRATORY: No cough, shortness of breath, wheezing or hemoptysis.  CARDIOVASCULAR: No chest pain, orthopnea, edema.  GASTROINTESTINAL: No nausea, vomiting, diarrhea or abdominal pain.  GENITOURINARY: No dysuria, hematuria.  ENDOCRINE: No polyuria, nocturia,  HEMATOLOGY: No anemia, easy bruising or bleeding SKIN: No rash or lesion. MUSCULOSKELETAL: No joint pain or arthritis.   NEUROLOGIC: No tingling, numbness, weakness.  PSYCHIATRY: No anxiety or depression.   ROS  DRUG ALLERGIES:   Allergies  Allergen Reactions  . Aspirin Nausea And Vomiting and Swelling    VITALS:  Blood pressure 124/89, pulse 64, temperature 97.9 F (36.6 C), temperature source Oral, resp. rate 20, height 5\' 7"  (1.702 m), weight 81.1 kg, SpO2 94 %.  PHYSICAL EXAMINATION:  GENERAL:  58 y.o.-year-old patient lying in the bed with no acute distress.  EYES: Pupils equal, round, reactive to light and accommodation. No scleral icterus. Extraocular muscles intact.  HEENT: Head atraumatic, normocephalic. Oropharynx and nasopharynx clear.  NECK:  Supple, no jugular venous distention. No thyroid enlargement, no tenderness.  LUNGS: Normal breath sounds bilaterally, have bilateral wheezing, no crepitation. No use of accessory muscles of respiration.  CARDIOVASCULAR: S1, S2 normal. No murmurs, rubs, or gallops.  ABDOMEN: Soft, nontender, nondistended. Bowel sounds present. No organomegaly or mass.  EXTREMITIES: No  pedal edema, cyanosis, or clubbing.  NEUROLOGIC: Cranial nerves II through XII are intact. Muscle strength 5/5 in all extremities. Sensation intact. Gait not checked.  PSYCHIATRIC: The patient is alert and oriented x 3.  SKIN: No obvious rash, lesion, or ulcer.   Physical Exam LABORATORY PANEL:   CBC Recent Labs  Lab 07/12/18 0511  WBC 5.6  HGB 13.1  HCT 41.0  PLT 221   ------------------------------------------------------------------------------------------------------------------  Chemistries  Recent Labs  Lab 07/12/18 0511  NA 137  K 4.5  CL 106  CO2 25  GLUCOSE 223*  BUN 19  CREATININE 0.76  CALCIUM 9.4   ------------------------------------------------------------------------------------------------------------------  Cardiac Enzymes Recent Labs  Lab 07/11/18 1900  TROPONINI <0.03   ------------------------------------------------------------------------------------------------------------------  RADIOLOGY:  Dg Chest 2 View  Result Date: 07/11/2018 CLINICAL DATA:  Cough, shortness of breath, and hypoxia. Near syncope. Smoker. EXAM: CHEST - 2 VIEW COMPARISON:  05/16/2017 FINDINGS: The cardiomediastinal silhouette is within normal limits for AP technique. Aortic atherosclerosis is noted. Lung volumes are lower than on the prior study with mild central pulmonary vascular congestion and mild bibasilar atelectasis. There is mild chronic accentuation of the interstitial markings diffusely. No pleural effusion or pneumothorax is identified. No acute osseous abnormality is seen. IMPRESSION: Decreased lung volumes with mild bibasilar atelectasis. Electronically Signed   By: Logan Bores M.D.   On: 07/11/2018 18:10   Ct Chest W Contrast  Addendum Date: 07/11/2018   ADDENDUM REPORT: 07/11/2018 19:59 ADDENDUM: Diffuse bronchial wall thickening upper and lower lobes. Bronchial diameter mildly dilated. Findings suggestive of bronchiectasis. Electronically Signed   By: Franchot Gallo M.D.   On: 07/11/2018 19:59   Result  Date: 07/11/2018 CLINICAL DATA:  Unexplained hypoxia EXAM: CT CHEST WITH CONTRAST TECHNIQUE: Multidetector CT imaging of the chest was performed during intravenous contrast administration. CONTRAST:  70mL OMNIPAQUE IOHEXOL 300 MG/ML  SOLN COMPARISON:  Chest two-view 07/11/2018 FINDINGS: Cardiovascular: Heart size normal. Moderate coronary calcification. Atherosclerotic disease aortic arch without aneurysm or dissection. Mediastinum/Nodes: Negative for mass or adenopathy Lungs/Pleura: Apical emphysema. Peripheral density right upper lobe posteriorly most compatible with scarring. Negative for pneumonia or effusion. No lung mass or nodule. Upper Abdomen: No acute abnormality.  Postop cholecystectomy Musculoskeletal: Degenerative changes throughout the thoracic spine. No acute skeletal abnormality. IMPRESSION: COPD with apical emphysema and probable scarring right upper lobe posteriorly. Negative for pneumonia or effusion Coronary calcification. Aortic Atherosclerosis (ICD10-I70.0). Electronically Signed: By: Franchot Gallo M.D. On: 07/11/2018 19:49    ASSESSMENT AND PLAN:   Principal Problem:   COPD exacerbation ([place]) Active Problems:   HTN (hypertension)   Hypothyroidism   GERD (gastroesophageal reflux disease)   HLD (hyperlipidemia)   Undifferentiated schizophrenia (HCC)  *  COPD exacerbation (HCC) -no pneumonia or infiltrates seen on imaging, white blood cell count is not elevated, pro calcitonin is negative.  Work-up consistent with COPD exacerbation.    IV steroids, azithromycin, duo nebs and PRN antitussive as well as home meds  *  HTN (hypertension) -home dose antihypertensive *  Undifferentiated schizophrenia ([place]) -home dose antipsychotics *  Hypothyroidism -home dose thyroid replacement *  GERD (gastroesophageal reflux disease) -home dose PPI *  HLD (hyperlipidemia) -home dose antilipid   All the records are reviewed and case discussed  with Care Management/Social Workerr. Management plans discussed with the patient, family and they are in agreement.  CODE STATUS: Full.  TOTAL TIME TAKING CARE OF THIS PATIENT: 35 minutes.     POSSIBLE D/C IN 1-2 DAYS, DEPENDING ON CLINICAL CONDITION.   Vaughan Basta M.D on 07/12/2018   Between 7am to 6pm - Pager - [phone]  After 6pm go to [email or web address] - password [email or web address] [facility]  Office  [phone]  CC: Primary care physician; Kendell Bane, NP  Note: This dictation was prepared with Dragon dictation along with smaller phrase technology. Any transcriptional errors that result from this process are unintentional.

## 2018-07-13 LAB — HIV ANTIBODY (ROUTINE TESTING W REFLEX): HIV Screen 4th Generation wRfx: NONREACTIVE

## 2018-07-13 LAB — URINE CULTURE: CULTURE: NO GROWTH

## 2018-07-13 NOTE — Progress Notes (Signed)
[place] [facility] [place] NAME: Christopher Mason    MR#:  [id number]  DATE OF BIRTH:  03-13-1961  SUBJECTIVE:  CHIEF COMPLAINT:   Chief Complaint  Patient presents with  . Shortness of Breath  . Cough  . Near Syncope   Continues to have shortness of breath, cough, headache.  REVIEW OF SYSTEMS:  CONSTITUTIONAL: Complains of fatigue EYES: No blurred or double vision.  EARS, NOSE, AND THROAT: No tinnitus or ear pain.  RESPIRATORY: Positive for cough, shortness of breath and wheezing CARDIOVASCULAR: No chest pain, orthopnea, edema.  GASTROINTESTINAL: No nausea, vomiting, diarrhea or abdominal pain.  GENITOURINARY: No dysuria, hematuria.  ENDOCRINE: No polyuria, nocturia,  HEMATOLOGY: No anemia, easy bruising or bleeding SKIN: No rash or lesion. MUSCULOSKELETAL: No joint pain or arthritis.   NEUROLOGIC: No tingling, numbness, weakness.  PSYCHIATRY: No anxiety or depression.   ROS  DRUG ALLERGIES:   Allergies  Allergen Reactions  . Aspirin Nausea And Vomiting and Swelling    VITALS:  Blood pressure 131/87, pulse 69, temperature 98.2 F (36.8 C), temperature source Oral, resp. rate 20, height 5\' 7"  (1.702 m), weight 81.1 kg, SpO2 97 %.  PHYSICAL EXAMINATION:  GENERAL:  58 y.o.-year-old patient lying in the bed with mild respiratory distress EYES: Pupils equal, round, reactive to light and accommodation. No scleral icterus. Extraocular muscles intact.  HEENT: Head atraumatic, normocephalic. Oropharynx and nasopharynx clear.  NECK:  Supple, no jugular venous distention. No thyroid enlargement, no tenderness.  LUNGS: Bilateral wheezing and decreased air entry CARDIOVASCULAR: S1, S2 normal. No murmurs, rubs, or gallops.  ABDOMEN: Soft, nontender, nondistended. Bowel sounds present. No organomegaly or mass.  EXTREMITIES: No pedal edema, cyanosis, or clubbing.  NEUROLOGIC: Cranial nerves II through XII are intact. Muscle strength 5/5 in all  extremities. Sensation intact. Gait not checked.  PSYCHIATRIC: The patient is alert and oriented x 3.  Anxious SKIN: No obvious rash, lesion, or ulcer.   Physical Exam LABORATORY PANEL:   CBC Recent Labs  Lab 07/12/18 0511  WBC 5.6  HGB 13.1  HCT 41.0  PLT 221   ------------------------------------------------------------------------------------------------------------------  Chemistries  Recent Labs  Lab 07/12/18 0511  NA 137  K 4.5  CL 106  CO2 25  GLUCOSE 223*  BUN 19  CREATININE 0.76  CALCIUM 9.4   ------------------------------------------------------------------------------------------------------------------  Cardiac Enzymes Recent Labs  Lab 07/11/18 1900  TROPONINI <0.03   ------------------------------------------------------------------------------------------------------------------  RADIOLOGY:  Dg Chest 2 View  Result Date: 07/11/2018 CLINICAL DATA:  Cough, shortness of breath, and hypoxia. Near syncope. Smoker. EXAM: CHEST - 2 VIEW COMPARISON:  05/16/2017 FINDINGS: The cardiomediastinal silhouette is within normal limits for AP technique. Aortic atherosclerosis is noted. Lung volumes are lower than on the prior study with mild central pulmonary vascular congestion and mild bibasilar atelectasis. There is mild chronic accentuation of the interstitial markings diffusely. No pleural effusion or pneumothorax is identified. No acute osseous abnormality is seen. IMPRESSION: Decreased lung volumes with mild bibasilar atelectasis. Electronically Signed   By: Logan Bores M.D.   On: 07/11/2018 18:10   Ct Chest W Contrast  Addendum Date: 07/11/2018   ADDENDUM REPORT: 07/11/2018 19:59 ADDENDUM: Diffuse bronchial wall thickening upper and lower lobes. Bronchial diameter mildly dilated. Findings suggestive of bronchiectasis. Electronically Signed   By: Franchot Gallo M.D.   On: 07/11/2018 19:59   Result Date: 07/11/2018 CLINICAL DATA:  Unexplained hypoxia EXAM: CT  CHEST WITH CONTRAST TECHNIQUE: Multidetector CT imaging of the chest was performed during  intravenous contrast administration. CONTRAST:  58mL OMNIPAQUE IOHEXOL 300 MG/ML  SOLN COMPARISON:  Chest two-view 07/11/2018 FINDINGS: Cardiovascular: Heart size normal. Moderate coronary calcification. Atherosclerotic disease aortic arch without aneurysm or dissection. Mediastinum/Nodes: Negative for mass or adenopathy Lungs/Pleura: Apical emphysema. Peripheral density right upper lobe posteriorly most compatible with scarring. Negative for pneumonia or effusion. No lung mass or nodule. Upper Abdomen: No acute abnormality.  Postop cholecystectomy Musculoskeletal: Degenerative changes throughout the thoracic spine. No acute skeletal abnormality. IMPRESSION: COPD with apical emphysema and probable scarring right upper lobe posteriorly. Negative for pneumonia or effusion Coronary calcification. Aortic Atherosclerosis (ICD10-I70.0). Electronically Signed: By: Franchot Gallo M.D. On: 07/11/2018 19:49    ASSESSMENT AND PLAN:   Principal Problem:   COPD exacerbation ([place]) Active Problems:   HTN (hypertension)   Hypothyroidism   GERD (gastroesophageal reflux disease)   HLD (hyperlipidemia)   Undifferentiated schizophrenia (HCC)  *  COPD exacerbation with acute hypoxic respiratory failure -IV steroids - Scheduled Nebulizers - Inhalers -Wean O2 as tolerated - Consult pulmonary if no improvement  *  HTN (hypertension) -home dose antihypertensive  *  Undifferentiated schizophrenia ([place]) -home dose antipsychotics  *  Hypothyroidism -home dose thyroid replacement  *  GERD (gastroesophageal reflux disease) -home dose PPI  *  HLD (hyperlipidemia) -home dose antilipid   All the records are reviewed and case discussed with Care Management/Social Worker Management plans discussed with the patient, family and they are in agreement.  CODE STATUS: Full.  TOTAL TIME TAKING CARE OF THIS PATIENT: 35 minutes.    POSSIBLE D/C IN 1-2 DAYS, DEPENDING ON CLINICAL CONDITION.   Neita Carp M.D on 07/13/2018   Between 7am to 6pm - Pager - [phone]  After 6pm go to [email or web address] - password [email or web address] [facility]  Office  [phone]  CC: Primary care physician; Kendell Bane, NP  Note: This dictation was prepared with Dragon dictation along with smaller phrase technology. Any transcriptional errors that result from this process are unintentional.

## 2018-07-14 MED ORDER — PREDNISONE 10 MG (21) PO TBPK: ORAL_TABLET | ORAL | 0 refills | Status: DC

## 2018-07-14 NOTE — Discharge Instructions (Signed)
Resume diet and activity as before ° ° °

## 2018-07-14 NOTE — TOC Transition Note (Signed)
Transition of Care [facility]) - CM/SW Discharge Note   Patient Details  Name: Christopher Mason MRN: [id number] Date of Birth: October 09, 1960  [facility] CM/SW Contact:  Latanya Maudlin, RN Phone Number: 07/14/2018, 2:51 PM   Clinical Narrative:  Patient to be discharged per MD order. Orders in place for home health services. Patient tells me he lives in a boarding home but visits his brothers frequently. Patient independent at baseline. Refuses home health and expresses that he is very active and walks multiple "miles in a day". Only ambulated 75 feet here but was able to climb stairs. Patient voices supports from family and roommates. Has a rolling walker. Continues to state he feels safe returning to his boarding home and prefers no home health.     Final next level of care: Home/Self Care     Patient Goals and CMS Choice   CMS Medicare.gov Compare Post Acute Care list provided to:: Patient Choice offered to / list presented to : Patient  Discharge Placement                       Discharge Plan and Services                    HH Arranged: Patient Refused [facility]     Social Determinants of Health (SDOH) Interventions     Readmission Risk Interventions No flowsheet data found.

## 2018-07-14 NOTE — Evaluation (Signed)
Physical Therapy Evaluation Patient Details Name: Christopher Mason MRN: [id number] DOB: 1961-02-02 Today's Date: 07/14/2018   History of Present Illness  Patient presented to Ed with hypoxia and cough (no fever, international travel, or COVID testing). History of bipolar disorder, schizophrenia, COPD and ongoing tobacco abuse, and HTN; Lives in a group home, ind in ADLs, uses public transportation, and cares for small dog.   Clinical Impression  Patient able to ambulate and negotiate stairs with supervision, d/t balance concerns. Patient most likely with decreased balance at baseline, but could benefit from PT for this as well as energy conservation at home to decrease fall risk and COPD exacerbation. Patient able to somewhat comply with PT breathing cues and gait correction to decrease fall risk during session. Impairments in LE and core strength, balance, and energy conservation. Patient currently unable to demonstrate safety in prolonged ambulation, or stair negotiation, inhibiting him from full participation in his boarding home navigation and taking care of small dog. Would benefit from skilled PT to address above deficits and promote optimal return to PLOF    Follow Up Recommendations Home health PT    Equipment Recommendations  Rolling walker with 5" wheels    Recommendations for Other Services       Precautions / Restrictions Precautions Precautions: Fall Restrictions Weight Bearing Restrictions: No      Mobility  Bed Mobility Overal bed mobility: Independent                Transfers Overall transfer level: Modified independent               General transfer comment: MIn assistance needed intiially for proper safety techniques with good carry over  Ambulation/Gait Ambulation/Gait assistance: Min guard Gait Distance (Feet): 75 Feet Assistive device: None Gait Pattern/deviations: Shuffle Gait velocity: decreased Gait velocity interpretation: <1.8 ft/sec,  indicate of risk for recurrent falls General Gait Details: Patient with decreased step length bilat and shuffle gait, able to somewhat correct this with cuing. PT had to give continuous cues for proper breathing technique for energy conservation  Stairs Stairs: Yes Stairs assistance: Supervision;Min guard Stair Management: Step to pattern;No rails Number of Stairs: 6 General stair comments: Able to ambulate up and down stairs safely with step two pattern- decreased stability with attempted reciprocol pattern  Wheelchair Mobility    Modified Rankin (Stroke Patients Only) Modified Rankin (Stroke Patients Only) Pre-Morbid Rankin Score: No significant disability Modified Rankin: No significant disability     Balance Overall balance assessment: Needs assistance Sitting-balance support: No upper extremity supported;Feet unsupported Sitting balance-Leahy Scale: Good       Standing balance-Leahy Scale: Poor Standing balance comment: Able to maintain balance with narrow BOS; no positions with eyes closed, and for no SL tasks                             Pertinent Vitals/Pain Pain Assessment: No/denies pain    Home Living Family/patient expects to be discharged to:: Group home(boarding house)                 Additional Comments: Reports he lives in boarding house with no roommate, only small dog, uses public transportation to get around. 6 stairs to enter without handrail    Prior Function Level of Independence: Independent         Comments: Reports he does not use any assistive devices at home     Hand Dominance   Dominant Hand: Right  Extremity/Trunk Assessment   Upper Extremity Assessment Upper Extremity Assessment: Overall WFL for tasks assessed    Lower Extremity Assessment Lower Extremity Assessment: Overall WFL for tasks assessed    Cervical / Trunk Assessment Cervical / Trunk Assessment: Kyphotic  Communication   Communication:  (difficulty with tongue control, some difficulty verbalizing d/t this)  Cognition Arousal/Alertness: Awake/alert Behavior During Therapy: WFL for tasks assessed/performed Overall Cognitive Status: Within Functional Limits for tasks assessed                                        General Comments      Exercises Other Exercises Other Exercises: During ambulation PT cues patient for increased step length and foot clearance to prevent fall risk which patient is able to comply with 50%. PT cued patient throughout session for pursed lip breathing for energy conservation which patient is able to demonstrate back accurately. PT led patient through stair ambulation where patient demonstrates step to pattern. PT cued patient for step through, but patient is drastically more unsafe, and demonstrates safety and independence with step to; PT encouraged this at home for time beinng.    Assessment/Plan    PT Assessment Patient needs continued PT services  PT Problem List Decreased strength;Decreased coordination;Decreased activity tolerance;Decreased balance;Decreased mobility       PT Treatment Interventions Therapeutic exercise;Gait training;Balance training;Stair training;Neuromuscular re-education;Therapeutic activities;Patient/family education    PT Goals (Current goals can be found in the Care Plan section)  Acute Rehab PT Goals Patient Stated Goal: Return home and care for his small dog PT Goal Formulation: With patient Time For Goal Achievement: 07/28/18 Potential to Achieve Goals: Good    Frequency Min 2X/week   Barriers to discharge   Lack of resources    Co-evaluation               AM-PAC PT "6 Clicks" Mobility  Outcome Measure Help needed turning from your back to your side while in a flat bed without using bedrails?: None Help needed moving from lying on your back to sitting on the side of a flat bed without using bedrails?: None Help needed moving to and  from a bed to a chair (including a wheelchair)?: None Help needed standing up from a chair using your arms (e.g., wheelchair or bedside chair)?: A Little Help needed to walk in hospital room?: A Little Help needed climbing 3-5 steps with a railing? : A Little 6 Click Score: 21    End of Session Equipment Utilized During Treatment: Gait belt Activity Tolerance: Patient tolerated treatment well Patient left: with chair alarm set;in chair;with call bell/phone within reach Nurse Communication: Mobility status;Precautions PT Visit Diagnosis: Unsteadiness on feet (R26.81);Other abnormalities of gait and mobility (R26.89);Difficulty in walking, not elsewhere classified (R26.2);Repeated falls (R29.6);Muscle weakness (generalized) (M62.81);History of falling (Z91.81)    Time: 1130-1157 PT Time Calculation (min) (ACUTE ONLY): 27 min   Charges:     PT Treatments $Gait Training: 8-22 mins       Shelton Silvas PT, DPT  Shelton Silvas 07/14/2018, 12:33 PM

## 2018-07-14 NOTE — Discharge Summary (Signed)
[facility] NAME: Christopher Mason    MR#:  [id number]  DATE OF BIRTH:  1961/03/30  DATE OF ADMISSION:  07/11/2018 ADMITTING PHYSICIAN: Lance Coon, MD  DATE OF DISCHARGE: 07/14/2018  PRIMARY CARE PHYSICIAN: Kendell Bane, NP   ADMISSION DIAGNOSIS:  Hypoxia [R09.02] COPD exacerbation ([place]) [J44.1]  DISCHARGE DIAGNOSIS:  Principal Problem:   COPD exacerbation ([place]) Active Problems:   HTN (hypertension)   Hypothyroidism   GERD (gastroesophageal reflux disease)   HLD (hyperlipidemia)   Undifferentiated schizophrenia ([place])   SECONDARY DIAGNOSIS:   Past Medical History:  Diagnosis Date  . Acute kidney injury ([place]) 11/20/2014  . ARF (acute renal failure) ([place]) 11/06/2014  . Asthma   . Bipolar disorder ([place])   . COPD (chronic obstructive pulmonary disease) ([place])   . Depression   . Gastritis 02/07/2015  . GERD (gastroesophageal reflux disease)   . GI bleeding 09/05/2015  . HLD (hyperlipidemia)   . Hypertension   . Hypokalemia 11/06/2014  . Hyponatremia 02/07/2015  . Hypotension 11/06/2014  . Hypothyroid   . Irritable bowel syndrome (IBS)   . Non compliance w medication regimen 09/16/2014  . Schizophrenia ([place])      ADMITTING HISTORY  HISTORY OF PRESENT ILLNESS:  Christopher Mason  is a 58 y.o. male who presents with chief complaint as above.  Patient presents to the ED with a complaint of increased cough, shortness of breath, wheezing.  He was seen at his primary care physician's office and was found to be hypoxic.  Here in the ED imaging and lab testing are consistent with COPD exacerbation.  Hospitalist were called for admission and further treatment   HOSPITAL COURSE:    * COPD exacerbation with acute hypoxic respiratory failure Treated with IV steroids, nebulizers and inhalers.  Patient improved.  His acute hypoxic respiratory failure has resolved.  93% on room air.  Patient feels his breathing is close to normal.  Has  minimal expiratory wheezing which seems to be his baseline.  He is requesting to be discharged from the hospital.  Will be discharged back to his group home with prednisone taper, continue his nebulizers at home.  *HTN (hypertension) -home dose antihypertensive  *Undifferentiated schizophrenia  -home dose antipsychotics  *Hypothyroidism -home dose thyroid replacement  *GERD (gastroesophageal reflux disease) -home dose PPI  *HLD (hyperlipidemia) -home dose antilipid  Patient will be discharged in stable condition from the hospital. No change in home medications.  CONSULTS OBTAINED:    DRUG ALLERGIES:   Allergies  Allergen Reactions  . Aspirin Nausea And Vomiting and Swelling    DISCHARGE MEDICATIONS:   Allergies as of 07/14/2018      Reactions   Aspirin Nausea And Vomiting, Swelling      Medication List    STOP taking these medications   azithromycin 250 MG tablet Commonly known as:  ZITHROMAX   predniSONE 10 MG tablet Commonly known as:  DELTASONE Replaced by:  predniSONE 10 MG (21) Tbpk tablet     TAKE these medications   amitriptyline 50 MG tablet Commonly known as:  ELAVIL Take 1 tablet (50 mg total) by mouth at bedtime.   amLODipine 10 MG tablet Commonly known as:  Norvasc Take 1 tablet (10 mg total) by mouth daily.   docusate sodium 100 MG capsule Commonly known as:  COLACE Take 1 capsule (100 mg total) by mouth daily.   ipratropium-albuterol 0.5-2.5 (3) MG/3ML Soln Commonly known as:  DUONEB Take 3 mLs by  nebulization every 6 (six) hours as needed.   levothyroxine 200 MCG tablet Commonly known as:  Synthroid Take 1 tablet (200 mcg total) by mouth daily before breakfast.   lisinopril 40 MG tablet Commonly known as:  PRINIVIL,ZESTRIL Take one tablet (40mg  total) by mouth daily   loperamide 2 MG tablet Commonly known as:  IMODIUM A-D Take 1 tablet (2 mg total) by mouth 4 (four) times daily as needed for diarrhea or loose stools.    paliperidone 9 MG 24 hr tablet Commonly known as:  INVEGA Take 9 mg by mouth daily.   pantoprazole 40 MG tablet Commonly known as:  PROTONIX Take 1 tablet (40 mg total) by mouth daily.   pravastatin 10 MG tablet Commonly known as:  PRAVACHOL Take 1 tablet (10 mg total) by mouth daily.   predniSONE 10 MG (21) Tbpk tablet Commonly known as:  STERAPRED UNI-PAK 21 TAB 6 tabs day 1 and taper 10 mg a day - 6 days Replaces:  predniSONE 10 MG tablet   propranolol 10 MG tablet Commonly known as:  INDERAL Take one tablet (10mg  total) by mouth three times a day.   traMADol 50 MG tablet Commonly known as:  Ultram Take 1 tablet (50 mg total) by mouth every 6 (six) hours as needed.   traZODone 100 MG tablet Commonly known as:  DESYREL Take 100 mg by mouth at bedtime.   venlafaxine XR 75 MG 24 hr capsule Commonly known as:  EFFEXOR-XR Take 1 capsule (75 mg total) by mouth daily with breakfast.       Today   VITAL SIGNS:  Blood pressure (!) 152/93, pulse 71, temperature 98.3 F (36.8 C), temperature source Oral, resp. rate 20, height 5\' 7"  (1.702 m), weight 81.1 kg, SpO2 92 %.  I/O:    Intake/Output Summary (Last 24 hours) at 07/14/2018 1037 Last data filed at 07/14/2018 1023 Gross per 24 hour  Intake 480 ml  Output 1275 ml  Net -795 ml    PHYSICAL EXAMINATION:  Physical Exam  GENERAL:  58 y.o.-year-old patient lying in the bed with no acute distress.  LUNGS: Mild expiratory wheezing.  Good air entry. CARDIOVASCULAR: S1, S2 normal. No murmurs, rubs, or gallops.  ABDOMEN: Soft, non-tender, non-distended. Bowel sounds present. No organomegaly or mass.  NEUROLOGIC: Moves all 4 extremities. PSYCHIATRIC: The patient is alert and oriented x 3.  SKIN: No obvious rash, lesion, or ulcer.   DATA REVIEW:   CBC Recent Labs  Lab 07/12/18 0511  WBC 5.6  HGB 13.1  HCT 41.0  PLT 221    Chemistries  Recent Labs  Lab 07/12/18 0511  NA 137  K 4.5  CL 106  CO2 25   GLUCOSE 223*  BUN 19  CREATININE 0.76  CALCIUM 9.4    Cardiac Enzymes Recent Labs  Lab 07/11/18 1900  TROPONINI <0.03    Microbiology Results  Results for orders placed or performed during the hospital encounter of 07/11/18  Blood Culture (routine x 2)     Status: None (Preliminary result)   Collection Time: 07/11/18  4:40 PM  Result Value Ref Range Status   Specimen Description BLOOD LEFT ANTECUBITAL  Final   Special Requests   Final    BOTTLES DRAWN AEROBIC AND ANAEROBIC Blood Culture adequate volume   Culture   Final    NO GROWTH 3 DAYS Performed at [facility], [address], [place], [place] [address]    Report Status PENDING  Incomplete  Blood Culture (routine  x 2)     Status: None (Preliminary result)   Collection Time: 07/11/18  4:44 PM  Result Value Ref Range Status   Specimen Description BLOOD BLOOD LEFT HAND  Final   Special Requests   Final    BOTTLES DRAWN AEROBIC AND ANAEROBIC Blood Culture results may not be optimal due to an excessive volume of blood received in culture bottles   Culture   Final    NO GROWTH 3 DAYS Performed at [facility], [address] [place], [place] [address]    Report Status PENDING  Incomplete  Urine culture     Status: None   Collection Time: 07/11/18 11:45 PM  Result Value Ref Range Status   Specimen Description   Final    URINE, RANDOM Performed at [facility], [address], [place], [place] [address]    Special Requests   Final    NONE Performed at [facility], [address], [place], [place] [address]    Culture   Final    NO GROWTH Performed at [facility], [place] [address], [place], [place] [address]    Report Status 07/13/2018 FINAL  Final    RADIOLOGY:  No results found.  Follow up with PCP in 1 week.  Management plans discussed with the patient, family and they are in agreement.  CODE STATUS:     Code Status Orders  (From admission, onward)          Start     Ordered   07/11/18 2127  Full code  Continuous     07/11/18 2126        Code Status History    Date Active Date Inactive Code Status Order ID Comments User Context   07/03/2017 1615 07/23/2017 1321 Full Code [id number]  Gonzella Lex, MD Inpatient   09/05/2015 0516 09/06/2015 2022 Full Code [id number]  Saundra Shelling, MD Inpatient   02/07/2015 0621 02/07/2015 1440 Full Code [id number]  Lance Coon, MD Inpatient   11/20/2014 0611 11/21/2014 1522 Full Code [id number]  Harrie Foreman, MD Inpatient   11/06/2014 1741 11/08/2014 1535 Full Code [id number]  Demetrios Loll, MD Inpatient   09/17/2014 0158 09/29/2014 1811 Full Code [id number]  Clapacs, Madie Reno, MD Inpatient      TOTAL TIME TAKING CARE OF THIS PATIENT ON DAY OF DISCHARGE: more than 30 minutes.   Leia Alf Raenette Sakata M.D on 07/14/2018 at 10:37 AM  Between 7am to 6pm - Pager - [phone]  After 6pm go to [email or web address] - password [email or web address] [facility]  Office  [phone]  CC: Primary care physician; Kendell Bane, NP  Note: This dictation was prepared with Dragon dictation along with smaller phrase technology. Any transcriptional errors that result from this process are unintentional.

## 2018-07-15 ENCOUNTER — Ambulatory Visit: Payer: Self-pay | Admitting: Adult Health

## 2018-07-16 LAB — CULTURE, BLOOD (ROUTINE X 2)
Culture: NO GROWTH
Culture: NO GROWTH
Special Requests: ADEQUATE

## 2018-07-17 ENCOUNTER — Telehealth: Payer: Self-pay | Admitting: Adult Health

## 2018-07-17 NOTE — Telephone Encounter (Signed)
Spoke with Claiborne Billings from [facility] patient nebulizer was being delivered today .

## 2018-07-23 ENCOUNTER — Ambulatory Visit (INDEPENDENT_AMBULATORY_CARE_PROVIDER_SITE_OTHER): Payer: Medicaid Other | Admitting: Adult Health

## 2018-07-23 ENCOUNTER — Other Ambulatory Visit: Payer: Self-pay

## 2018-07-23 ENCOUNTER — Encounter: Payer: Self-pay | Admitting: Adult Health

## 2018-07-23 VITALS — BP 104/70 | HR 81 | Resp 16 | Ht 67.0 in | Wt 190.0 lb

## 2018-07-23 DIAGNOSIS — G8929 Other chronic pain: Secondary | ICD-10-CM

## 2018-07-23 DIAGNOSIS — J441 Chronic obstructive pulmonary disease with (acute) exacerbation: Secondary | ICD-10-CM | POA: Diagnosis not present

## 2018-07-23 DIAGNOSIS — M545 Low back pain: Secondary | ICD-10-CM | POA: Diagnosis not present

## 2018-07-23 DIAGNOSIS — F17210 Nicotine dependence, cigarettes, uncomplicated: Secondary | ICD-10-CM | POA: Diagnosis not present

## 2018-07-23 DIAGNOSIS — J4520 Mild intermittent asthma, uncomplicated: Secondary | ICD-10-CM | POA: Diagnosis not present
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
## 2018-08-08 ENCOUNTER — Ambulatory Visit: Payer: Self-pay | Admitting: Urology

## 2018-08-16 ENCOUNTER — Other Ambulatory Visit: Payer: Self-pay

## 2018-08-16 MED ORDER — PANTOPRAZOLE SODIUM 40 MG PO TBEC: 40.0000 mg | DELAYED_RELEASE_TABLET | Freq: Every day | ORAL | 5 refills | Status: DC

## 2018-08-16 MED ORDER — PROPRANOLOL HCL 10 MG PO TABS: ORAL_TABLET | ORAL | 5 refills | Status: DC

## 2018-08-16 MED ORDER — LISINOPRIL 40 MG PO TABS: ORAL_TABLET | ORAL | 5 refills | Status: DC

## 2018-09-12 ENCOUNTER — Other Ambulatory Visit: Payer: Self-pay

## 2018-09-12 MED ORDER — IPRATROPIUM-ALBUTEROL 0.5-2.5 (3) MG/3ML IN SOLN: 3.0000 mL | Freq: Four times a day (QID) | RESPIRATORY_TRACT | 1 refills | Status: DC | PRN

## 2018-09-27 ENCOUNTER — Other Ambulatory Visit: Payer: Self-pay

## 2018-09-27 DIAGNOSIS — E039 Hypothyroidism, unspecified: Secondary | ICD-10-CM

## 2018-09-27 MED ORDER — LEVOTHYROXINE SODIUM 200 MCG PO TABS: 200.0000 ug | ORAL_TABLET | Freq: Every day | ORAL | 2 refills | Status: DC

## 2018-10-01 ENCOUNTER — Encounter: Payer: Self-pay | Admitting: Adult Health

## 2018-10-01 ENCOUNTER — Ambulatory Visit (INDEPENDENT_AMBULATORY_CARE_PROVIDER_SITE_OTHER): Payer: Medicaid Other | Admitting: Adult Health

## 2018-10-01 ENCOUNTER — Other Ambulatory Visit: Payer: Self-pay

## 2018-10-01 VITALS — BP 110/82 | HR 57 | Resp 20 | Ht 67.0 in | Wt 190.0 lb

## 2018-10-01 DIAGNOSIS — I10 Essential (primary) hypertension: Secondary | ICD-10-CM | POA: Diagnosis not present

## 2018-10-01 DIAGNOSIS — K5900 Constipation, unspecified: Secondary | ICD-10-CM

## 2018-10-01 DIAGNOSIS — J4520 Mild intermittent asthma, uncomplicated: Secondary | ICD-10-CM

## 2018-10-01 DIAGNOSIS — K219 Gastro-esophageal reflux disease without esophagitis: Secondary | ICD-10-CM

## 2018-10-01 DIAGNOSIS — J449 Chronic obstructive pulmonary disease, unspecified: Secondary | ICD-10-CM | POA: Diagnosis not present

## 2018-10-01 DIAGNOSIS — E785 Hyperlipidemia, unspecified: Secondary | ICD-10-CM

## 2018-10-01 DIAGNOSIS — F17210 Nicotine dependence, cigarettes, uncomplicated: Secondary | ICD-10-CM | POA: Diagnosis not present

## 2018-10-01 MED ORDER — PRAVASTATIN SODIUM 10 MG PO TABS: 10.0000 mg | ORAL_TABLET | Freq: Every day | ORAL | 6 refills | Status: DC

## 2018-10-01 MED ORDER — AMLODIPINE BESYLATE 10 MG PO TABS: 10.0000 mg | ORAL_TABLET | Freq: Every day | ORAL | 6 refills | Status: DC

## 2018-10-01 MED ORDER — DOCUSATE SODIUM 100 MG PO CAPS: 100.0000 mg | ORAL_CAPSULE | Freq: Every day | ORAL | 3 refills | Status: DC

## 2018-10-01 MED ORDER — ALBUTEROL SULFATE HFA 108 (90 BASE) MCG/ACT IN AERS: 2.0000 | INHALATION_SPRAY | Freq: Four times a day (QID) | RESPIRATORY_TRACT | 3 refills | Status: DC | PRN
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
## 2018-10-15 ENCOUNTER — Encounter: Payer: Self-pay | Admitting: Urology

## 2018-10-15 ENCOUNTER — Ambulatory Visit: Payer: Self-pay | Admitting: Urology

## 2018-12-06 ENCOUNTER — Ambulatory Visit (INDEPENDENT_AMBULATORY_CARE_PROVIDER_SITE_OTHER): Payer: Medicaid Other | Admitting: Urology

## 2018-12-06 ENCOUNTER — Other Ambulatory Visit: Payer: Self-pay

## 2018-12-06 ENCOUNTER — Encounter: Payer: Self-pay | Admitting: Urology

## 2018-12-06 VITALS — BP 150/93 | HR 85 | Ht 67.0 in | Wt 190.1 lb

## 2018-12-06 DIAGNOSIS — N5201 Erectile dysfunction due to arterial insufficiency: Secondary | ICD-10-CM | POA: Diagnosis not present

## 2018-12-07 NOTE — Progress Notes (Signed)
12/06/2018 7:17 PM   Christopher Mason 01-01-1961 [id number]  Referring provider: Cletis Athens, MD [address] [place],  [place] [address]  Chief Complaint  Patient presents with  . Erectile Dysfunction    HPI: Christopher Mason is a 58 y.o. male who presents for evaluation of erectile dysfunction.  He presents with a 2-year history of difficulty achieving and maintaining an erection. SHIM was 5/25 indicating severe ED.  He has tried sildenafil in the past which she states was not effective.  Denied pain or curvature with erections.  He has significant organic risk factors including hypertension, hyperlipidemia, depression, antihypertensive medications including beta-blockers and psychotropic medications.  He has a past significant tobacco history.   PMH: Past Medical History:  Diagnosis Date  . Acute kidney injury ([place]) 11/20/2014  . ARF (acute renal failure) ([place]) 11/06/2014  . Asthma   . Bipolar disorder ([place])   . COPD (chronic obstructive pulmonary disease) ([place])   . Depression   . Gastritis 02/07/2015  . GERD (gastroesophageal reflux disease)   . GI bleeding 09/05/2015  . HLD (hyperlipidemia)   . Hypertension   . Hypokalemia 11/06/2014  . Hyponatremia 02/07/2015  . Hypotension 11/06/2014  . Hypothyroid   . Irritable bowel syndrome (IBS)   . Non compliance w medication regimen 09/16/2014  . Schizophrenia [facility])     Surgical History: Past Surgical History:  Procedure Laterality Date  . BACK SURGERY    . CARPAL TUNNEL RELEASE Bilateral   . CHOLECYSTECTOMY    . ESOPHAGOGASTRODUODENOSCOPY (EGD) WITH PROPOFOL N/A 09/06/2015   Procedure: ESOPHAGOGASTRODUODENOSCOPY (EGD) WITH PROPOFOL;  Surgeon: Hulen Luster, MD;  Location: [facility] ENDOSCOPY;  Service: Endoscopy;  Laterality: N/A;  . ROTATOR CUFF REPAIR     2007    Home Medications:  Allergies as of 12/06/2018      Reactions   Aspirin Nausea And Vomiting, Swelling      Medication List       Accurate as of December 06, 2018  11:59 PM. If you have any questions, ask your nurse or doctor.        albuterol 108 (90 Base) MCG/ACT inhaler Commonly known as: VENTOLIN HFA Inhale 2 puffs into the lungs every 6 (six) hours as needed for wheezing or shortness of breath.   amitriptyline 50 MG tablet Commonly known as: ELAVIL Take 1 tablet (50 mg total) by mouth at bedtime.   amLODipine 10 MG tablet Commonly known as: Norvasc Take 1 tablet (10 mg total) by mouth daily.   docusate sodium 100 MG capsule Commonly known as: COLACE Take 1 capsule (100 mg total) by mouth daily.   ipratropium-albuterol 0.5-2.5 (3) MG/3ML Soln Commonly known as: DUONEB Take 3 mLs by nebulization every 6 (six) hours as needed.   levothyroxine 200 MCG tablet Commonly known as: Synthroid Take 1 tablet (200 mcg total) by mouth daily before breakfast.   lisinopril 40 MG tablet Commonly known as: ZESTRIL Take one tablet (40mg  total) by mouth daily   loperamide 2 MG tablet Commonly known as: IMODIUM A-D Take 1 tablet (2 mg total) by mouth 4 (four) times daily as needed for diarrhea or loose stools.   paliperidone 9 MG 24 hr tablet Commonly known as: INVEGA Take 9 mg by mouth daily.   pantoprazole 40 MG tablet Commonly known as: PROTONIX Take 1 tablet (40 mg total) by mouth daily.   pravastatin 10 MG tablet Commonly known as: PRAVACHOL Take 1 tablet (10 mg total) by mouth daily.   propranolol 10  MG tablet Commonly known as: INDERAL Take one tablet (10mg  total) by mouth three times a day.   traMADol 50 MG tablet Commonly known as: Ultram Take 1 tablet (50 mg total) by mouth every 6 (six) hours as needed.   traZODone 100 MG tablet Commonly known as: DESYREL Take 100 mg by mouth at bedtime.   venlafaxine XR 75 MG 24 hr capsule Commonly known as: EFFEXOR-XR Take 1 capsule (75 mg total) by mouth daily with breakfast.       Allergies:  Allergies  Allergen Reactions  . Aspirin Nausea And Vomiting and Swelling     Family History: Family History  Problem Relation Age of Onset  . Hypertension Father   . Cataracts Father   . Diabetes Sister   . Diabetes Brother   . Dementia Mother     Social History:  reports that he quit smoking about 4 months ago. His smoking use included cigarettes. He smoked 1.00 pack per day. He has never used smokeless tobacco. He reports current drug use. Frequency: 1.00 time per week. Drug: Marijuana. He reports that he does not drink alcohol.  ROS: UROLOGY Frequent Urination?: No Hard to postpone urination?: No Burning/pain with urination?: No Get up at night to urinate?: No Leakage of urine?: No Urine stream starts and stops?: No Trouble starting stream?: No Do you have to strain to urinate?: No Blood in urine?: No Urinary tract infection?: No Sexually transmitted disease?: No Injury to kidneys or bladder?: No Painful intercourse?: No Weak stream?: No Erection problems?: Yes Penile pain?: No  Gastrointestinal Nausea?: No Vomiting?: No Indigestion/heartburn?: No Diarrhea?: No Constipation?: No  Constitutional Fever: No Night sweats?: No Weight loss?: No Fatigue?: No  Skin Skin rash/lesions?: No Itching?: No  Eyes Blurred vision?: No Double vision?: No  Ears/Nose/Throat Sore throat?: No Sinus problems?: No  Hematologic/Lymphatic Swollen glands?: No Easy bruising?: No  Cardiovascular Leg swelling?: No Chest pain?: No  Respiratory Cough?: No Shortness of breath?: No  Endocrine Excessive thirst?: No  Musculoskeletal Back pain?: No Joint pain?: No  Neurological Headaches?: No Dizziness?: No  Psychologic Depression?: No Anxiety?: No  Physical Exam: BP (!) 150/93 (BP Location: Left Arm, Patient Position: Sitting, Cuff Size: Normal)   Pulse 85   Ht 5\' 7"  (1.702 m)   Wt 190 lb 1.6 oz (86.2 kg)   BMI 29.77 kg/m   Constitutional:  Alert and oriented, No acute distress. HEENT: [place] AT, moist mucus membranes.  Trachea midline,  no masses. Cardiovascular: No clubbing, cyanosis, or edema. Respiratory: Normal respiratory effort, no increased work of breathing. GI: Abdomen is soft, nontender, nondistended, no abdominal masses GU: No CVA tenderness.  Penis circumcised without lesions, testes descended bilaterally without masses or tenderness.  Spermatic cord/epididymis palpably normal bilaterally. Lymph: No cervical or inguinal lymphadenopathy. Skin: No rashes, bruises or suspicious lesions. Neurologic: Grossly intact, no focal deficits, moving all 4 extremities. Psychiatric: Normal mood and affect.   Assessment & Plan:    - Erectile dysfunction  Significant organic risk factors and contributing medications.  He has previously failed sildenafil.  I discussed other treatment options including intracavernosal injections and vacuum erection devices.  He was given literature on each and states he would like to think over these options.  He will call back if he desires to proceed further treatment.   Abbie Sons, [place] [address] [place], [place] [address] [phone]

## 2018-12-09 ENCOUNTER — Encounter: Payer: Self-pay | Admitting: Urology

## 2018-12-09 DIAGNOSIS — N5201 Erectile dysfunction due to arterial insufficiency: Secondary | ICD-10-CM

## 2018-12-09 HISTORY — DX: Erectile dysfunction due to arterial insufficiency: N52.01

## 2019-01-01 ENCOUNTER — Other Ambulatory Visit: Payer: Self-pay

## 2019-01-01 DIAGNOSIS — E039 Hypothyroidism, unspecified: Secondary | ICD-10-CM

## 2019-01-01 MED ORDER — LEVOTHYROXINE SODIUM 200 MCG PO TABS: 200.0000 ug | ORAL_TABLET | Freq: Every day | ORAL | 2 refills | Status: DC

## 2019-02-03 ENCOUNTER — Ambulatory Visit: Payer: Medicaid Other | Admitting: Adult Health

## 2019-02-03 ENCOUNTER — Other Ambulatory Visit: Payer: Self-pay

## 2019-02-03 ENCOUNTER — Encounter: Payer: Self-pay | Admitting: Adult Health

## 2019-02-03 VITALS — BP 123/84 | HR 67 | Temp 97.2°F | Resp 16 | Ht 67.0 in | Wt 188.0 lb

## 2019-02-03 DIAGNOSIS — E785 Hyperlipidemia, unspecified: Secondary | ICD-10-CM

## 2019-02-03 DIAGNOSIS — I10 Essential (primary) hypertension: Secondary | ICD-10-CM

## 2019-02-03 DIAGNOSIS — F17210 Nicotine dependence, cigarettes, uncomplicated: Secondary | ICD-10-CM

## 2019-02-03 DIAGNOSIS — J449 Chronic obstructive pulmonary disease, unspecified: Secondary | ICD-10-CM | POA: Diagnosis not present

## 2019-02-03 DIAGNOSIS — K219 Gastro-esophageal reflux disease without esophagitis: Secondary | ICD-10-CM
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
## 2019-02-18 ENCOUNTER — Other Ambulatory Visit: Payer: Self-pay | Admitting: Adult Health

## 2019-02-19 LAB — CBC WITH DIFFERENTIAL/PLATELET
Basophils Absolute: 0.1 10*3/uL (ref 0.0–0.2)
Basos: 1 %
EOS (ABSOLUTE): 0.4 10*3/uL (ref 0.0–0.4)
Eos: 5 %
Hematocrit: 44.7 % (ref 37.5–51.0)
Hemoglobin: 14.8 g/dL (ref 13.0–17.7)
Immature Grans (Abs): 0 10*3/uL (ref 0.0–0.1)
Immature Granulocytes: 0 %
Lymphocytes Absolute: 1.6 10*3/uL (ref 0.7–3.1)
Lymphs: 21 %
MCH: 29.5 pg (ref 26.6–33.0)
MCHC: 33.1 g/dL (ref 31.5–35.7)
MCV: 89 fL (ref 79–97)
Monocytes Absolute: 0.6 10*3/uL (ref 0.1–0.9)
Monocytes: 8 %
Neutrophils Absolute: 4.7 10*3/uL (ref 1.4–7.0)
Neutrophils: 65 %
Platelets: 224 10*3/uL (ref 150–450)
RBC: 5.01 x10E6/uL (ref 4.14–5.80)
RDW: 13.5 % (ref 11.6–15.4)
WBC: 7.4 10*3/uL (ref 3.4–10.8)

## 2019-02-19 LAB — COMPREHENSIVE METABOLIC PANEL
ALT: 20 IU/L (ref 0–44)
AST: 17 IU/L (ref 0–40)
Albumin/Globulin Ratio: 1.4 (ref 1.2–2.2)
Albumin: 4.1 g/dL (ref 3.8–4.9)
Alkaline Phosphatase: 113 IU/L (ref 39–117)
BUN/Creatinine Ratio: 15 (ref 9–20)
BUN: 15 mg/dL (ref 6–24)
Bilirubin Total: 0.2 mg/dL (ref 0.0–1.2)
CO2: 23 mmol/L (ref 20–29)
Calcium: 9.7 mg/dL (ref 8.7–10.2)
Chloride: 104 mmol/L (ref 96–106)
Creatinine, Ser: 1.02 mg/dL (ref 0.76–1.27)
GFR calc Af Amer: 93 mL/min/{1.73_m2} (ref >59)
GFR calc non Af Amer: 80 mL/min/{1.73_m2} (ref >59)
Globulin, Total: 2.9 g/dL (ref 1.5–4.5)
Glucose: 67 mg/dL (ref 65–99)
Potassium: 4.4 mmol/L (ref 3.5–5.2)
Sodium: 140 mmol/L (ref 134–144)
Total Protein: 7 g/dL (ref 6.0–8.5)

## 2019-02-19 LAB — LIPID PANEL W/O CHOL/HDL RATIO
Cholesterol, Total: 164 mg/dL (ref 100–199)
HDL: 38 mg/dL — ABNORMAL LOW (ref >39)
LDL Chol Calc (NIH): 104 mg/dL — ABNORMAL HIGH (ref 0–99)
Triglycerides: 122 mg/dL (ref 0–149)
VLDL Cholesterol Cal: 22 mg/dL (ref 5–40)

## 2019-02-19 LAB — TSH: TSH: 0.017 u[IU]/mL — ABNORMAL LOW (ref 0.450–4.500)

## 2019-02-19 LAB — T4, FREE: Free T4: 2.24 ng/dL — ABNORMAL HIGH (ref 0.82–1.77)

## 2019-02-19 LAB — PSA: Prostate Specific Ag, Serum: 0.8 ng/mL (ref 0.0–4.0)

## 2019-02-26 ENCOUNTER — Other Ambulatory Visit: Payer: Self-pay | Admitting: Adult Health

## 2019-02-26 DIAGNOSIS — K5900 Constipation, unspecified: Secondary | ICD-10-CM

## 2019-02-27 ENCOUNTER — Encounter: Payer: Self-pay | Admitting: Adult Health

## 2019-02-27 ENCOUNTER — Ambulatory Visit
Admission: RE | Admit: 2019-02-27 | Discharge: 2019-02-27 | Disposition: A | Discharge status: Home / Self Care | Payer: Medicaid Other | Source: Ambulatory Visit | Attending: Adult Health | Admitting: Adult Health

## 2019-02-27 ENCOUNTER — Other Ambulatory Visit: Payer: Self-pay

## 2019-02-27 ENCOUNTER — Ambulatory Visit: Payer: Medicaid Other | Admitting: Adult Health

## 2019-02-27 VITALS — BP 125/90 | HR 74 | Temp 97.2°F | Resp 16 | Ht 67.0 in | Wt 186.0 lb

## 2019-02-27 DIAGNOSIS — I10 Essential (primary) hypertension: Secondary | ICD-10-CM

## 2019-02-27 DIAGNOSIS — M545 Low back pain: Secondary | ICD-10-CM | POA: Diagnosis not present

## 2019-02-27 DIAGNOSIS — E039 Hypothyroidism, unspecified: Secondary | ICD-10-CM

## 2019-02-27 DIAGNOSIS — Z0001 Encounter for general adult medical examination with abnormal findings: Secondary | ICD-10-CM | POA: Diagnosis not present

## 2019-02-27 DIAGNOSIS — G8929 Other chronic pain: Secondary | ICD-10-CM | POA: Diagnosis present

## 2019-02-27 DIAGNOSIS — E785 Hyperlipidemia, unspecified: Secondary | ICD-10-CM

## 2019-02-27 DIAGNOSIS — F17210 Nicotine dependence, cigarettes, uncomplicated: Secondary | ICD-10-CM

## 2019-02-27 DIAGNOSIS — J441 Chronic obstructive pulmonary disease with (acute) exacerbation: Secondary | ICD-10-CM | POA: Diagnosis not present

## 2019-02-27 MED ORDER — LEVOTHYROXINE SODIUM 150 MCG PO TABS: 150.0000 ug | ORAL_TABLET | Freq: Every day | ORAL | 3 refills | Status: DC

## 2019-02-27 MED ORDER — AZITHROMYCIN 250 MG PO TABS: ORAL_TABLET | ORAL | 0 refills | Status: DC

## 2019-02-27 MED ORDER — PREDNISONE 10 MG PO TABS: ORAL_TABLET | ORAL | 0 refills | Status: DC

## 2019-02-27 NOTE — Progress Notes (Signed)
[facility] [place], [place] [address]  Internal MEDICINE  Office Visit Note  Patient Name: Christopher Mason  [id number]  [id number]  Date of Service: 02/27/2019  Chief Complaint  Patient presents with  . Annual Exam  . Hypertension  . Hyperlipidemia  . Depression  . Gastroesophageal Reflux  . Quality Metric Gaps    colonoscopy  . Medical Management of Chronic Issues    back pain     HPI Pt is here for routine health maintenance examination. Today complains on right sided mid back pain that has been persistent for several days. Has attempted heating pad and acetaminophen with no relief. Pain has prevented patient from sleeping well at night. Has had multiple back surgeries in the past. Reports breathing has been stable but reports using his albuterol inhaler every day even if he feels as though he doesn't need it. Reports using duo-neb twice daily. Has had a productive cough for several weeks, denies fevers or increased shortness of breath. Overall, reports feeling healthy. Denies chest pain, palpitations or dizziness.  Current Medication: Outpatient Encounter Medications as of 02/27/2019  Medication Sig  . albuterol (VENTOLIN HFA) 108 (90 Base) MCG/ACT inhaler Inhale 2 puffs into the lungs every 6 (six) hours as needed for wheezing or shortness of breath.  Marland Kitchen amitriptyline (ELAVIL) 50 MG tablet Take 1 tablet (50 mg total) by mouth at bedtime.  Marland Kitchen amLODipine (NORVASC) 10 MG tablet Take 1 tablet (10 mg total) by mouth daily.  Marland Kitchen docusate sodium (COLACE) 100 MG capsule Take 1 capsule (100 mg total) by mouth daily.  Marland Kitchen ipratropium-albuterol (DUONEB) 0.5-2.5 (3) MG/3ML SOLN Take 3 mLs by nebulization every 6 (six) hours as needed.  Marland Kitchen levothyroxine (SYNTHROID) 200 MCG tablet Take 1 tablet (200 mcg total) by mouth daily before breakfast.  . lisinopril (ZESTRIL) 40 MG tablet Take one tablet (40mg  total) by mouth daily  . loperamide (IMODIUM A-D) 2 MG tablet Take  1 tablet (2 mg total) by mouth 4 (four) times daily as needed for diarrhea or loose stools.  . paliperidone (INVEGA) 9 MG 24 hr tablet Take 9 mg by mouth daily.  . pantoprazole (PROTONIX) 40 MG tablet Take 1 tablet (40 mg total) by mouth daily.  . pravastatin (PRAVACHOL) 10 MG tablet Take 1 tablet (10 mg total) by mouth daily.  . propranolol (INDERAL) 10 MG tablet Take one tablet (10mg  total) by mouth three times a day.  . traMADol (ULTRAM) 50 MG tablet Take 1 tablet (50 mg total) by mouth every 6 (six) hours as needed.  . traZODone (DESYREL) 100 MG tablet Take 100 mg by mouth at bedtime.  Marland Kitchen venlafaxine XR (EFFEXOR-XR) 75 MG 24 hr capsule Take 1 capsule (75 mg total) by mouth daily with breakfast.   No facility-administered encounter medications on file as of 02/27/2019.     Surgical History: Past Surgical History:  Procedure Laterality Date  . BACK SURGERY    . CARPAL TUNNEL RELEASE Bilateral   . CHOLECYSTECTOMY    . ESOPHAGOGASTRODUODENOSCOPY (EGD) WITH PROPOFOL N/A 09/06/2015   Procedure: ESOPHAGOGASTRODUODENOSCOPY (EGD) WITH PROPOFOL;  Surgeon: Hulen Luster, MD;  Location: [facility] ENDOSCOPY;  Service: Endoscopy;  Laterality: N/A;  . ROTATOR CUFF REPAIR     2007    Medical History: Past Medical History:  Diagnosis Date  . Acute kidney injury ([place]) 11/20/2014  . ARF (acute renal failure) ([place]) 11/06/2014  . Asthma   . Bipolar disorder ([place])   . COPD (chronic obstructive pulmonary  disease) ([place])   . Depression   . Gastritis 02/07/2015  . GERD (gastroesophageal reflux disease)   . GI bleeding 09/05/2015  . HLD (hyperlipidemia)   . Hypertension   . Hypokalemia 11/06/2014  . Hyponatremia 02/07/2015  . Hypotension 11/06/2014  . Hypothyroid   . Irritable bowel syndrome (IBS)   . Non compliance w medication regimen 09/16/2014  . Schizophrenia ([place])     Family History: Family History  Problem Relation Age of Onset  . Hypertension Father   . Cataracts Father   . Diabetes Sister   .  Diabetes Brother   . Dementia Mother     Review of Systems  Constitutional: Negative.  Negative for chills, fatigue and unexpected weight change.  HENT: Negative.  Negative for congestion, rhinorrhea, sneezing and sore throat.   Eyes: Negative for redness.  Respiratory: Negative.  Negative for cough, chest tightness and shortness of breath.   Cardiovascular: Negative.  Negative for chest pain and palpitations.  Gastrointestinal: Negative.  Negative for abdominal pain, constipation, diarrhea, nausea and vomiting.  Endocrine: Negative.   Genitourinary: Negative.  Negative for dysuria and frequency.  Musculoskeletal: Positive for back pain. Negative for arthralgias, joint swelling and neck pain.  Skin: Negative.  Negative for rash.  Allergic/Immunologic: Negative.   Neurological: Negative.  Negative for tremors and numbness.  Hematological: Negative for adenopathy. Does not bruise/bleed easily.  Psychiatric/Behavioral: Negative.  Negative for behavioral problems, sleep disturbance and suicidal ideas. The patient is not nervous/anxious.      Vital Signs: BP 125/90   Pulse 74   Temp (!) 97.2 F (36.2 C)   Resp 16   Ht 5\' 7"  (1.702 m)   Wt 186 lb (84.4 kg)   SpO2 94%   BMI 29.13 kg/m    Physical Exam Vitals signs and nursing note reviewed.  Constitutional:      General: He is not in acute distress.    Appearance: He is well-developed. He is not diaphoretic.  HENT:     Head: Normocephalic and atraumatic.     Mouth/Throat:     Pharynx: No oropharyngeal exudate.  Eyes:     Pupils: Pupils are equal, round, and reactive to light.  Neck:     Musculoskeletal: Normal range of motion and neck supple.     Thyroid: No thyromegaly.     Vascular: No JVD.     Trachea: No tracheal deviation.  Cardiovascular:     Rate and Rhythm: Normal rate and regular rhythm.     Heart sounds: Normal heart sounds. No murmur. No friction rub. No gallop.   Pulmonary:     Effort: Pulmonary effort is  normal. No respiratory distress.     Breath sounds: Wheezing present. No rales.  Chest:     Chest wall: No tenderness.  Abdominal:     Palpations: Abdomen is soft.     Tenderness: There is no abdominal tenderness. There is no guarding.  Musculoskeletal: Normal range of motion.  Lymphadenopathy:     Cervical: No cervical adenopathy.  Skin:    General: Skin is warm and dry.  Neurological:     Mental Status: He is alert and oriented to person, place, and time.     Cranial Nerves: No cranial nerve deficit.  Psychiatric:        Behavior: Behavior normal.        Thought Content: Thought content normal.        Judgment: Judgment normal.        LABS: Recent  Results (from the past 2160 hour(s))  CBC with Differential/Platelet     Status: None   Collection Time: 02/18/19  4:02 PM  Result Value Ref Range   WBC 7.4 3.4 - 10.8 x10E3/uL   RBC 5.01 4.14 - 5.80 x10E6/uL   Hemoglobin 14.8 13.0 - 17.7 g/dL   Hematocrit 44.7 37.5 - 51.0 %   MCV 89 79 - 97 fL   MCH 29.5 26.6 - 33.0 pg   MCHC 33.1 31.5 - 35.7 g/dL   RDW 13.5 11.6 - 15.4 %   Platelets 224 150 - 450 x10E3/uL   Neutrophils 65 Not Estab. %   Lymphs 21 Not Estab. %   Monocytes 8 Not Estab. %   Eos 5 Not Estab. %   Basos 1 Not Estab. %   Neutrophils Absolute 4.7 1.4 - 7.0 x10E3/uL   Lymphocytes Absolute 1.6 0.7 - 3.1 x10E3/uL   Monocytes Absolute 0.6 0.1 - 0.9 x10E3/uL   EOS (ABSOLUTE) 0.4 0.0 - 0.4 x10E3/uL   Basophils Absolute 0.1 0.0 - 0.2 x10E3/uL   Immature Granulocytes 0 Not Estab. %   Immature Grans (Abs) 0.0 0.0 - 0.1 x10E3/uL  Comprehensive metabolic panel     Status: None   Collection Time: 02/18/19  4:02 PM  Result Value Ref Range   Glucose 67 65 - 99 mg/dL   BUN 15 6 - 24 mg/dL   Creatinine, Ser 1.02 0.76 - 1.27 mg/dL   GFR calc non Af Amer 80 >59 mL/min/1.73   GFR calc Af Amer 93 >59 mL/min/1.73   BUN/Creatinine Ratio 15 9 - 20   Sodium 140 134 - 144 mmol/L   Potassium 4.4 3.5 - 5.2 mmol/L   Chloride  104 96 - 106 mmol/L   CO2 23 20 - 29 mmol/L   Calcium 9.7 8.7 - 10.2 mg/dL   Total Protein 7.0 6.0 - 8.5 g/dL   Albumin 4.1 3.8 - 4.9 g/dL   Globulin, Total 2.9 1.5 - 4.5 g/dL   Albumin/Globulin Ratio 1.4 1.2 - 2.2   Bilirubin Total 0.2 0.0 - 1.2 mg/dL   Alkaline Phosphatase 113 39 - 117 IU/L   AST 17 0 - 40 IU/L   ALT 20 0 - 44 IU/L  Lipid Panel w/o Chol/HDL Ratio     Status: Abnormal   Collection Time: 02/18/19  4:02 PM  Result Value Ref Range   Cholesterol, Total 164 100 - 199 mg/dL   Triglycerides 122 0 - 149 mg/dL   HDL 38 (L) >39 mg/dL   VLDL Cholesterol Cal 22 5 - 40 mg/dL   LDL Chol Calc (NIH) 104 (H) 0 - 99 mg/dL  T4, free     Status: Abnormal   Collection Time: 02/18/19  4:02 PM  Result Value Ref Range   Free T4 2.24 (H) 0.82 - 1.77 ng/dL  TSH     Status: Abnormal   Collection Time: 02/18/19  4:02 PM  Result Value Ref Range   TSH 0.017 (L) 0.450 - 4.500 uIU/mL  PSA     Status: None   Collection Time: 02/18/19  4:02 PM  Result Value Ref Range   Prostate Specific Ag, Serum 0.8 0.0 - 4.0 ng/mL    Comment: Roche ECLIA methodology. According to the American Urological Association, Serum PSA should decrease and remain at undetectable levels after radical prostatectomy. The AUA defines biochemical recurrence as an initial PSA value 0.2 ng/mL or greater followed by a subsequent confirmatory PSA value 0.2 ng/mL or greater. Values obtained  with different assay methods or kits cannot be used interchangeably. Results cannot be interpreted as absolute evidence of the presence or absence of malignant disease.     Assessment/Plan: 1. Encounter for general adult medical examination with abnormal findings Well appearing 58 year old male, up to date on PHM.  2. COPD with acute exacerbation (HCC) Increased wheezing and productive cough. History of pneumonia in the past. - predniSONE (DELTASONE) 10 MG tablet; Use per dose pack  Dispense: 21 tablet; Refill: 0 - azithromycin  (ZITHROMAX) 250 MG tablet; Use as directed  Dispense: 6 tablet; Refill: 0  3. Acquired hypothyroidism Recent lab values reveal elevated T4 with low TSH, has been on 235mcg levothyroxine for several years. Will recheck levels in 8 weeks. - levothyroxine (SYNTHROID) 150 MCG tablet; Take 1 tablet (150 mcg total) by mouth daily.  Dispense: 90 tablet; Refill: 3  4. Chronic right-sided low back pain without sciatica Chronic pain with acute flare of pain. Multiple surgeries in the past, has attempted heat and acetaminophen with no relief. - DG Thoracic Spine W/Swimmers; Future  5. Hyperlipidemia, unspecified hyperlipidemia type Stable on current therapy, continue to monitor.  6. Essential hypertension Stable on current therapy, continue to monitor.  7. Cigarette nicotine dependence without complication Smoking cessation counseling: 1. Pt acknowledges the risks of long term smoking, she will try to quite smoking. 2. Options for different medications including nicotine products, chewing gum, patch etc, Wellbutrin and Chantix is discussed 3. Goal and date of compete cessation is discussed 4. Total time spent in smoking cessation is 15 min.  General Counseling: Christopher Mason understanding of the findings of todays visit and agrees with plan of treatment. I have discussed any further diagnostic evaluation that may be needed or ordered today. We also reviewed his medications today. he has been encouraged to call the office with any questions or concerns that should arise related to todays visit.   No orders of the defined types were placed in this encounter.   No orders of the defined types were placed in this encounter.   Time spent: 30 Minutes   This patient was seen by Orson Gear AGNP-C in Collaboration with Dr Lavera Guise as a part of collaborative care agreement    Kendell Bane AGNP-C Internal Medicine

## 2019-03-06 IMAGING — CR DG ABDOMEN 1V
1 series · 1 of 1 positions shown · non-contrast
Comparison: CT 09/05/2015

CLINICAL DATA: Nausea vomiting

EXAM:
ABDOMEN - 1 VIEW

[dg abd 1 view]
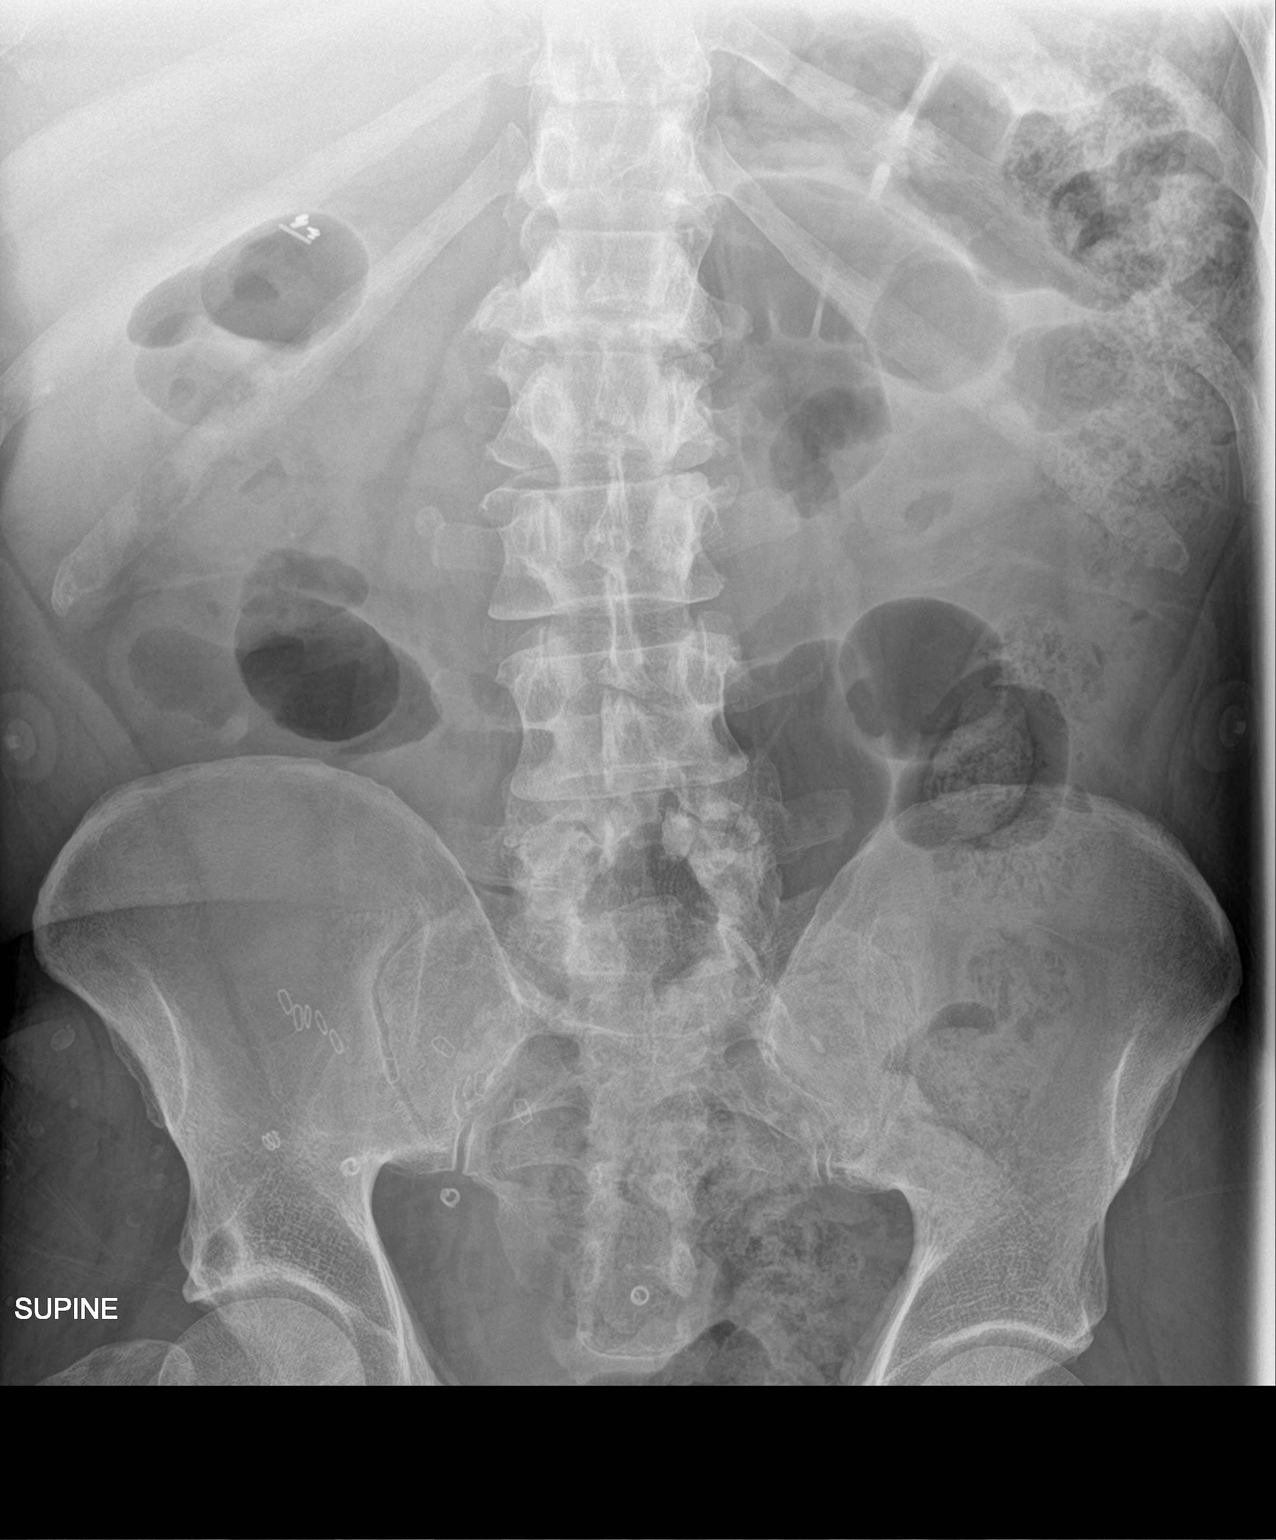

[1 of 1 positions shown; findings below may reference images not displayed]

FINDINGS: Normal bowel gas pattern.

Cholecystectomy clips. Lumbar laminectomy L5-S1 on the left. Ventral
hernia with mesh in the right lower quadrant.
IMPRESSION: No acute abnormality.

## 2019-03-06 IMAGING — CR DG CHEST 2V
1 series · 2 of 2 positions shown · non-contrast
Comparison: 12/19/2016

CLINICAL DATA: Cough

EXAM:
CHEST  2 VIEW

[Series 1: dg chest 2 view · 0.14mm/px · 2 of 2 slices shown]
[im 1/2]
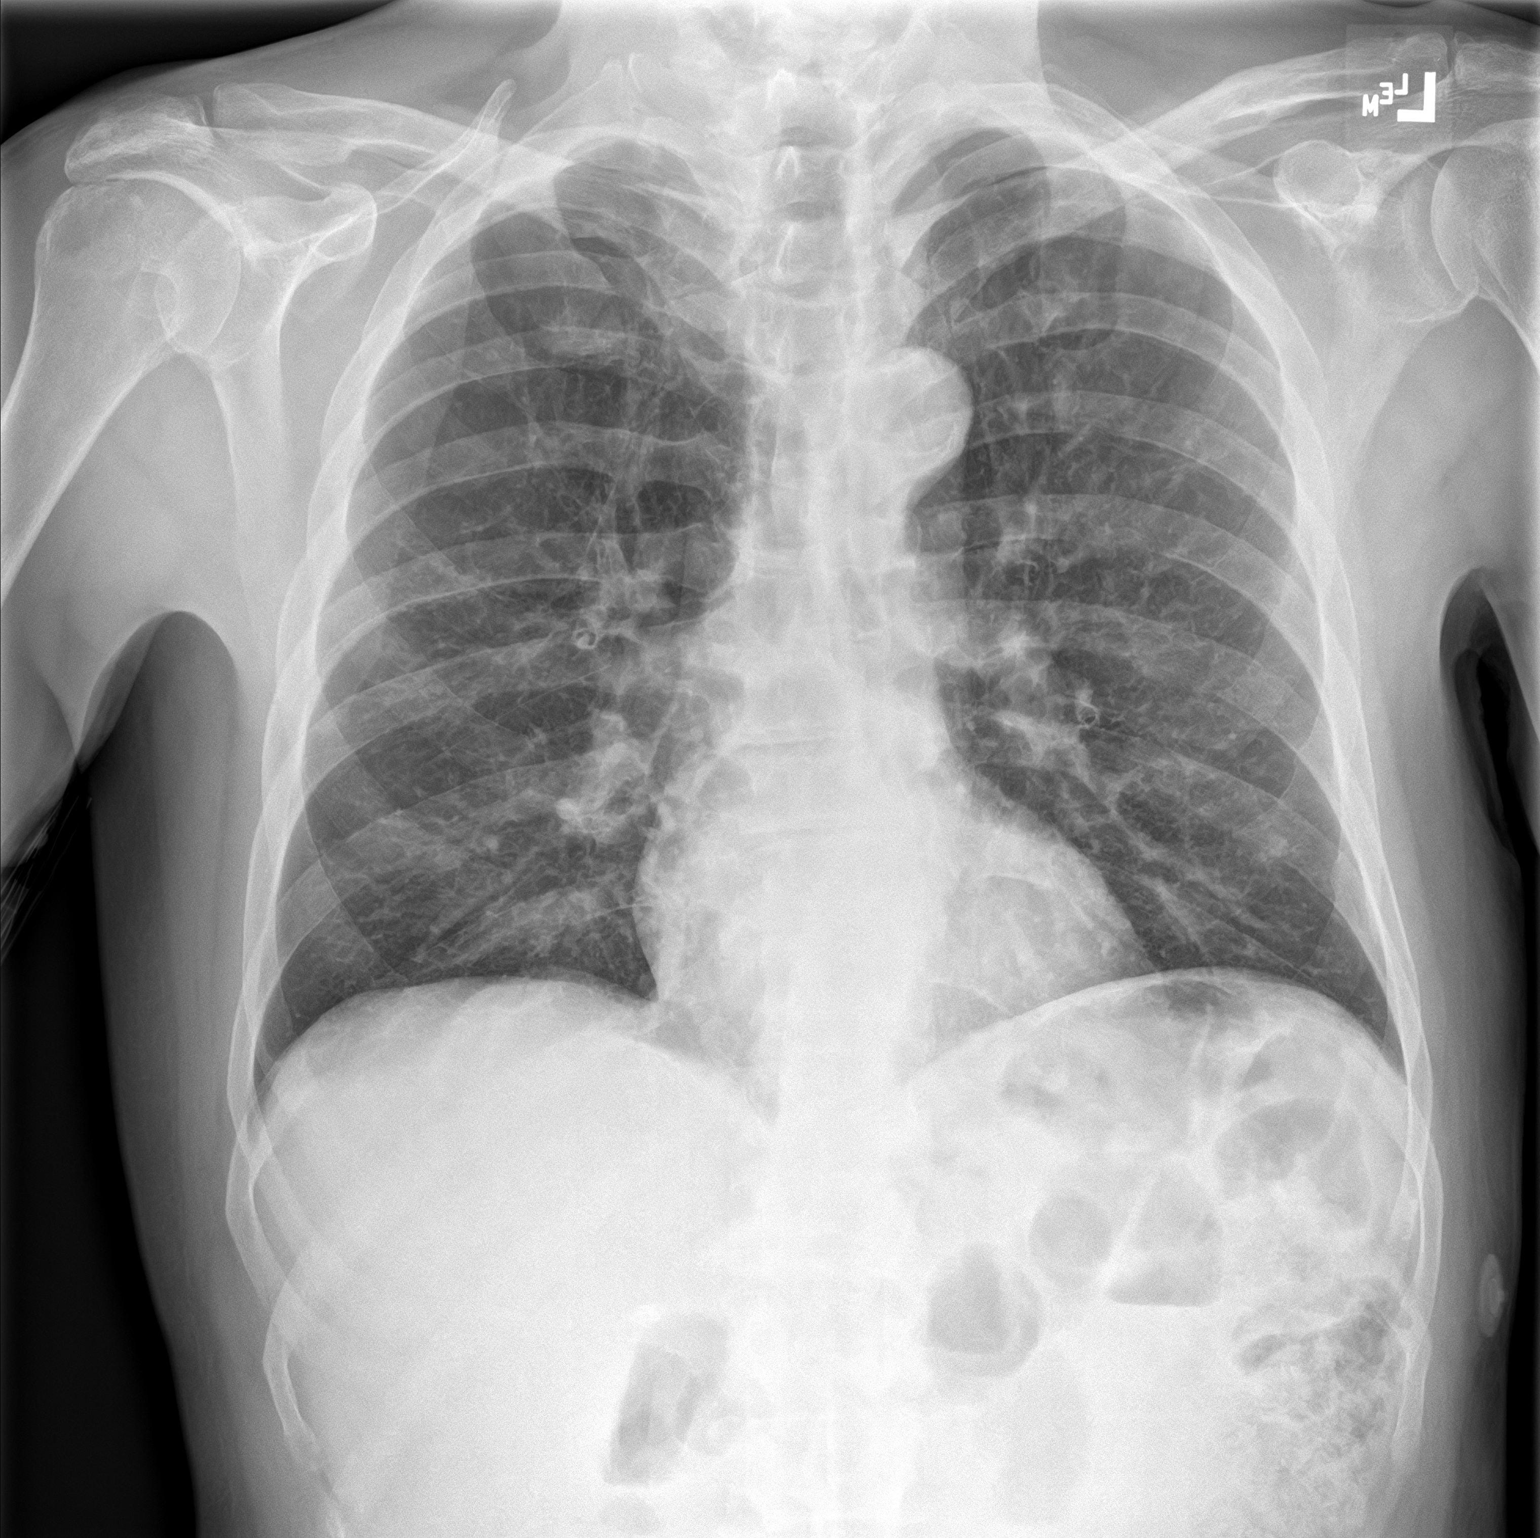
[im 2/2]
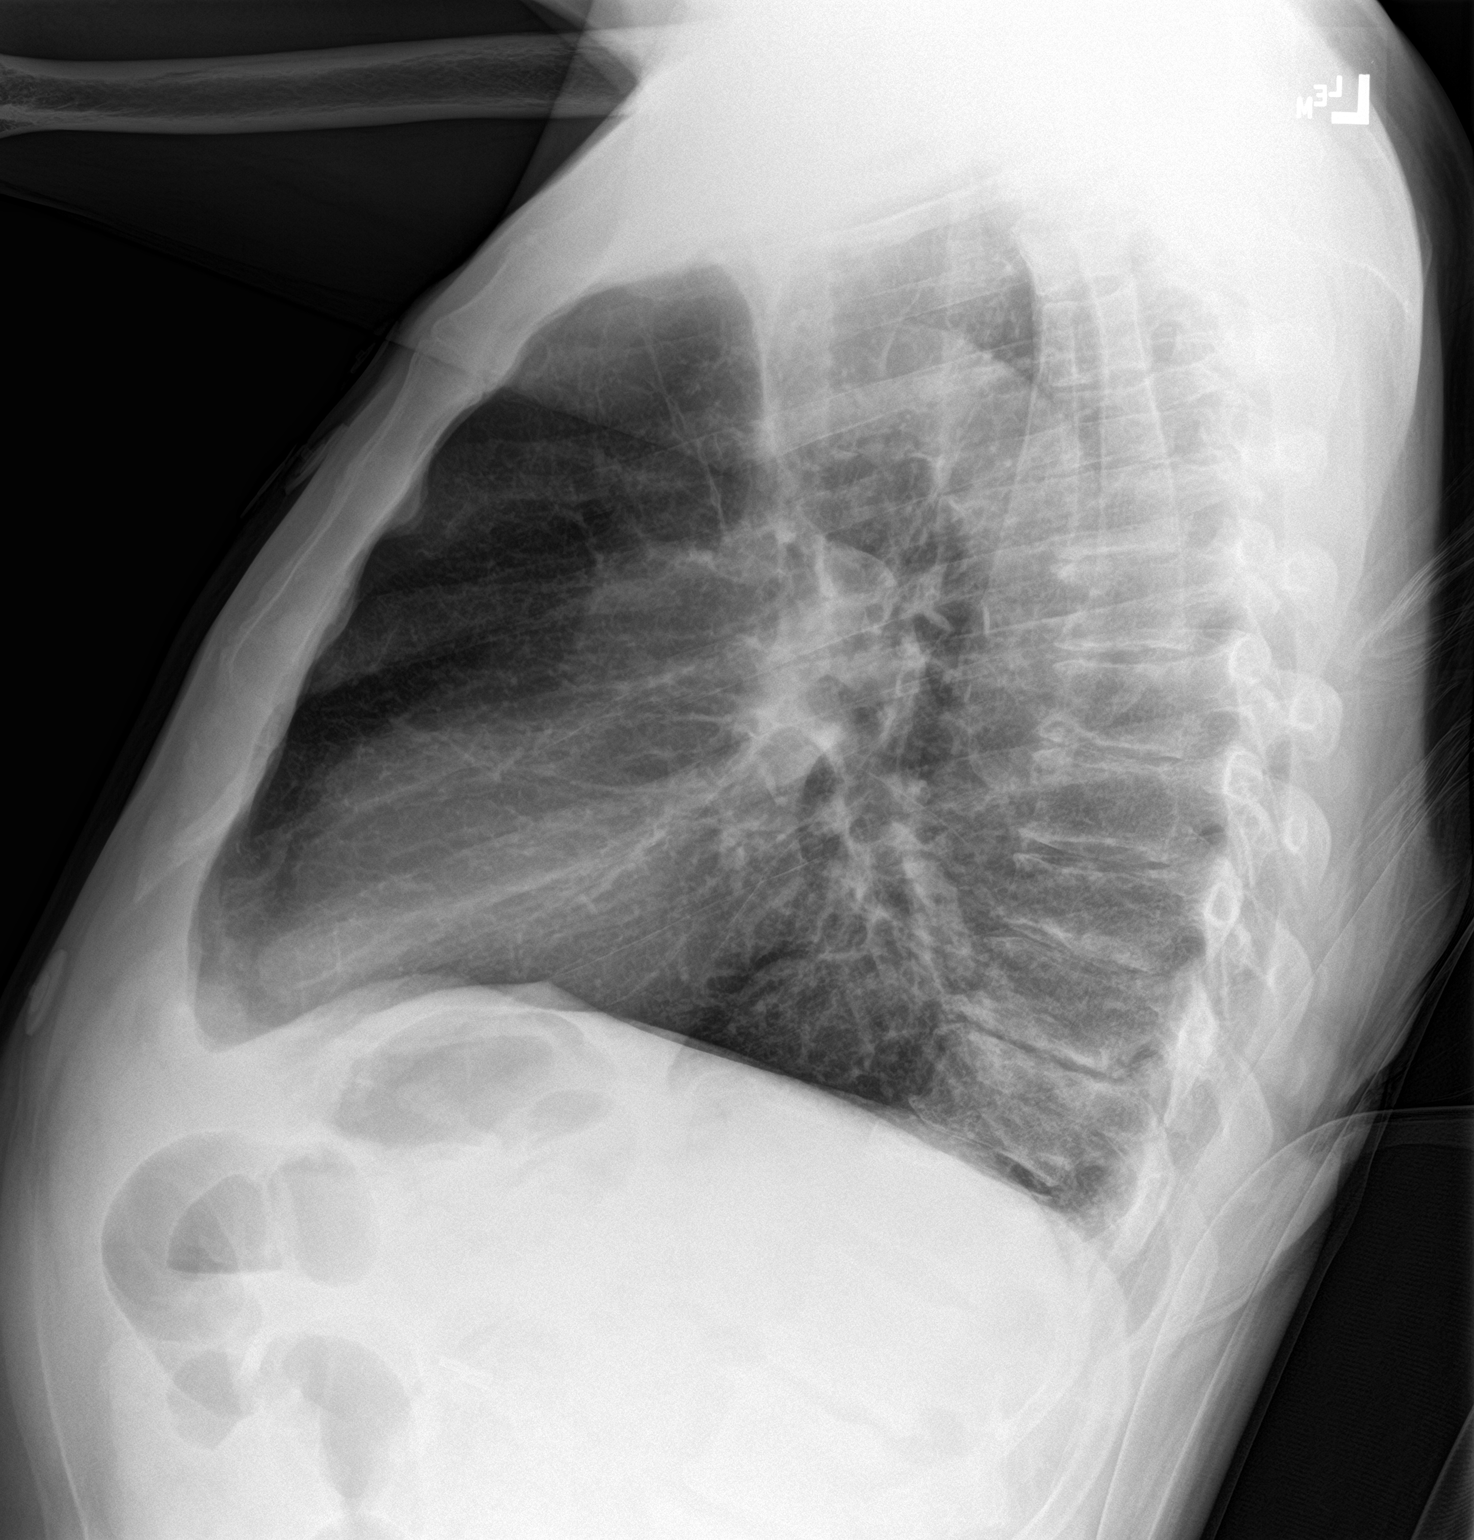

[2 of 2 positions shown; findings below may reference images not displayed]

FINDINGS: Heart size and vascularity normal. Negative for infiltrate or
effusion. Mild scarring right upper lobe. Negative for mass lesion.
IMPRESSION: No active cardiopulmonary disease.

## 2019-03-09 ENCOUNTER — Telehealth: Payer: Self-pay | Admitting: Internal Medicine

## 2019-04-14 ENCOUNTER — Other Ambulatory Visit: Payer: Self-pay | Admitting: Adult Health

## 2019-04-15 LAB — T4, FREE: Free T4: 1.64 ng/dL (ref 0.82–1.77)

## 2019-04-15 LAB — TSH: TSH: 0.327 u[IU]/mL — ABNORMAL LOW (ref 0.450–4.500)

## 2019-04-21 ENCOUNTER — Telehealth: Payer: Self-pay

## 2019-04-21 NOTE — Telephone Encounter (Signed)
Confirmed appointment with patient. klh °

## 2019-04-21 NOTE — Telephone Encounter (Signed)
Rescheduled appointment on 04/22/2019 to 04/30/2019, patient is having transportation issues. klh

## 2019-04-22 ENCOUNTER — Ambulatory Visit: Payer: Medicaid Other | Admitting: Adult Health

## 2019-04-28 ENCOUNTER — Telehealth: Payer: Self-pay

## 2019-04-28 NOTE — Telephone Encounter (Signed)
Confirmed appointment with patient. klh °

## 2019-04-30 ENCOUNTER — Telehealth: Payer: Self-pay

## 2019-04-30 ENCOUNTER — Ambulatory Visit: Payer: Medicaid Other | Admitting: Adult Health

## 2019-04-30 NOTE — Telephone Encounter (Signed)
Rescheduled appointment on 04/30/2019 to 05/14/2019, patient did not have ride transportation. klh

## 2019-05-01 ENCOUNTER — Other Ambulatory Visit: Payer: Self-pay | Admitting: Urology

## 2019-05-01 ENCOUNTER — Other Ambulatory Visit: Payer: Self-pay

## 2019-05-01 NOTE — Telephone Encounter (Signed)
Pharmacy called and needs refills for Vesicare and Flomax sent to them. [facility] Fax# [phone] Ph# [phone]

## 2019-05-02 NOTE — Telephone Encounter (Signed)
Pt calls and request that you take over refilling his tamsulosin and vesicare. Per care everywhere he is doing Vesicare 10mg , and tamsulosin 0.4mg  once daily. Pt last seen 12/06/18 for ED. Please advise.

## 2019-05-05 ENCOUNTER — Other Ambulatory Visit: Payer: Self-pay

## 2019-05-05 DIAGNOSIS — E039 Hypothyroidism, unspecified: Secondary | ICD-10-CM

## 2019-05-05 MED ORDER — LEVOTHYROXINE SODIUM 150 MCG PO TABS: 150.0000 ug | ORAL_TABLET | Freq: Every day | ORAL | 3 refills | Status: DC

## 2019-05-05 MED ORDER — SOLIFENACIN SUCCINATE 10 MG PO TABS: 10.0000 mg | ORAL_TABLET | Freq: Every day | ORAL | 2 refills | Status: DC

## 2019-05-05 MED ORDER — TAMSULOSIN HCL 0.4 MG PO CAPS: 0.4000 mg | ORAL_CAPSULE | Freq: Every day | ORAL | 2 refills | Status: DC

## 2019-05-12 ENCOUNTER — Telehealth: Payer: Self-pay

## 2019-05-12 NOTE — Telephone Encounter (Signed)
Confirmed virtual visit with patient. klh 

## 2019-05-14 ENCOUNTER — Ambulatory Visit: Payer: Medicaid Other | Admitting: Adult Health

## 2019-05-14 ENCOUNTER — Encounter: Payer: Self-pay | Admitting: Adult Health

## 2019-05-14 ENCOUNTER — Other Ambulatory Visit: Payer: Self-pay

## 2019-05-14 DIAGNOSIS — J441 Chronic obstructive pulmonary disease with (acute) exacerbation: Secondary | ICD-10-CM | POA: Diagnosis not present

## 2019-05-14 DIAGNOSIS — E785 Hyperlipidemia, unspecified: Secondary | ICD-10-CM

## 2019-05-14 DIAGNOSIS — I10 Essential (primary) hypertension: Secondary | ICD-10-CM | POA: Diagnosis not present

## 2019-05-14 DIAGNOSIS — E039 Hypothyroidism, unspecified: Secondary | ICD-10-CM
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
## 2019-05-22 ENCOUNTER — Other Ambulatory Visit: Payer: Self-pay | Admitting: Adult Health

## 2019-05-22 DIAGNOSIS — K5900 Constipation, unspecified: Secondary | ICD-10-CM

## 2019-06-19 ENCOUNTER — Other Ambulatory Visit: Payer: Self-pay

## 2019-06-19 DIAGNOSIS — E785 Hyperlipidemia, unspecified: Secondary | ICD-10-CM

## 2019-06-19 DIAGNOSIS — I10 Essential (primary) hypertension: Secondary | ICD-10-CM

## 2019-06-19 MED ORDER — AMLODIPINE BESYLATE 10 MG PO TABS: 10.0000 mg | ORAL_TABLET | Freq: Every day | ORAL | 6 refills | Status: DC

## 2019-06-19 MED ORDER — PRAVASTATIN SODIUM 10 MG PO TABS: 10.0000 mg | ORAL_TABLET | Freq: Every day | ORAL | 6 refills | Status: DC

## 2019-08-15 ENCOUNTER — Other Ambulatory Visit: Payer: Self-pay | Admitting: Adult Health

## 2019-09-04 ENCOUNTER — Telehealth: Payer: Self-pay

## 2019-09-04 NOTE — Telephone Encounter (Signed)
CMN SIGNED BY PROVIDER AND PLACED IN [employer] PATIENT FOLDER.

## 2019-09-09 ENCOUNTER — Telehealth: Payer: Self-pay

## 2019-09-09 ENCOUNTER — Other Ambulatory Visit: Payer: Self-pay | Admitting: Adult Health

## 2019-09-09 DIAGNOSIS — K5900 Constipation, unspecified: Secondary | ICD-10-CM

## 2019-09-09 NOTE — Telephone Encounter (Signed)
CMN signed and placed in [place] Patient folder.

## 2019-09-09 NOTE — Telephone Encounter (Signed)
Confirmed appointment on 09/11/2019 and screened for covid. klh 

## 2019-09-11 ENCOUNTER — Encounter: Payer: Self-pay | Admitting: Adult Health

## 2019-09-11 ENCOUNTER — Other Ambulatory Visit: Payer: Self-pay

## 2019-09-11 ENCOUNTER — Ambulatory Visit (INDEPENDENT_AMBULATORY_CARE_PROVIDER_SITE_OTHER): Payer: Medicaid Other | Admitting: Adult Health

## 2019-09-11 VITALS — BP 110/76 | HR 79 | Temp 97.7°F | Resp 16 | Ht 67.0 in | Wt 189.6 lb

## 2019-09-11 DIAGNOSIS — F17210 Nicotine dependence, cigarettes, uncomplicated: Secondary | ICD-10-CM | POA: Diagnosis not present

## 2019-09-11 DIAGNOSIS — E785 Hyperlipidemia, unspecified: Secondary | ICD-10-CM

## 2019-09-11 DIAGNOSIS — I10 Essential (primary) hypertension: Secondary | ICD-10-CM

## 2019-09-11 DIAGNOSIS — J0100 Acute maxillary sinusitis, unspecified: Secondary | ICD-10-CM

## 2019-09-11 DIAGNOSIS — J449 Chronic obstructive pulmonary disease, unspecified: Secondary | ICD-10-CM | POA: Diagnosis not present

## 2019-09-11 DIAGNOSIS — E039 Hypothyroidism, unspecified: Secondary | ICD-10-CM | POA: Diagnosis not present

## 2019-09-11 MED ORDER — AMOXICILLIN-POT CLAVULANATE 875-125 MG PO TABS: 1.0000 | ORAL_TABLET | Freq: Two times a day (BID) | ORAL | 0 refills | Status: DC
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
## 2019-09-15 ENCOUNTER — Other Ambulatory Visit: Payer: Self-pay | Admitting: Adult Health

## 2019-10-07 ENCOUNTER — Telehealth: Payer: Self-pay

## 2019-10-07 NOTE — Telephone Encounter (Signed)
Confirmed appointment on 10/09/2019 and screened for covid. klh  

## 2019-10-09 ENCOUNTER — Other Ambulatory Visit: Payer: Self-pay

## 2019-10-09 ENCOUNTER — Ambulatory Visit: Payer: Medicaid Other | Admitting: Adult Health

## 2019-10-09 ENCOUNTER — Encounter: Payer: Self-pay | Admitting: Adult Health

## 2019-10-09 VITALS — BP 107/80 | HR 64 | Temp 97.5°F | Resp 16 | Ht 67.0 in | Wt 185.0 lb

## 2019-10-09 DIAGNOSIS — E785 Hyperlipidemia, unspecified: Secondary | ICD-10-CM

## 2019-10-09 DIAGNOSIS — E039 Hypothyroidism, unspecified: Secondary | ICD-10-CM | POA: Diagnosis not present

## 2019-10-09 DIAGNOSIS — I10 Essential (primary) hypertension: Secondary | ICD-10-CM | POA: Diagnosis not present

## 2019-10-09 DIAGNOSIS — J449 Chronic obstructive pulmonary disease, unspecified: Secondary | ICD-10-CM

## 2019-10-09 DIAGNOSIS — Z23 Encounter for immunization: Secondary | ICD-10-CM

## 2019-10-09 DIAGNOSIS — F17210 Nicotine dependence, cigarettes, uncomplicated: Secondary | ICD-10-CM
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
## 2019-11-19 ENCOUNTER — Telehealth: Payer: Self-pay

## 2019-11-19 NOTE — Telephone Encounter (Signed)
Confirmed appointment on 11/20/2019 and screened for covid. klh 

## 2019-11-20 ENCOUNTER — Ambulatory Visit (INDEPENDENT_AMBULATORY_CARE_PROVIDER_SITE_OTHER): Payer: Medicaid Other | Admitting: Adult Health

## 2019-11-20 ENCOUNTER — Encounter: Payer: Self-pay | Admitting: Adult Health

## 2019-11-20 ENCOUNTER — Other Ambulatory Visit: Payer: Self-pay

## 2019-11-20 ENCOUNTER — Ambulatory Visit
Admission: RE | Admit: 2019-11-20 | Discharge: 2019-11-20 | Disposition: A | Discharge status: Home / Self Care | Payer: Medicaid Other | Attending: Adult Health | Admitting: Adult Health

## 2019-11-20 ENCOUNTER — Ambulatory Visit
Admission: RE | Admit: 2019-11-20 | Discharge: 2019-11-20 | Disposition: A | Discharge status: Home / Self Care | Payer: Medicaid Other | Source: Ambulatory Visit | Attending: Adult Health | Admitting: Adult Health

## 2019-11-20 VITALS — BP 115/73 | HR 76 | Temp 97.4°F | Resp 16 | Ht 67.0 in | Wt 185.0 lb

## 2019-11-20 DIAGNOSIS — R600 Localized edema: Secondary | ICD-10-CM | POA: Diagnosis not present

## 2019-11-20 DIAGNOSIS — Z1211 Encounter for screening for malignant neoplasm of colon: Secondary | ICD-10-CM | POA: Diagnosis not present

## 2019-11-20 DIAGNOSIS — M79672 Pain in left foot: Secondary | ICD-10-CM | POA: Insufficient documentation

## 2019-11-20 NOTE — Progress Notes (Signed)
[place], [place] [address]  Internal MEDICINE  Office Visit Note  Patient Name: Christopher Mason  [id number]  [id number]  Date of Service: 12/09/2019  Chief Complaint  Patient presents with  . Acute Visit    pts left foot is swollen painful when walking on it   . Hypertension  . Hyperlipidemia  . Gastroesophageal Reflux  . COPD  . Quality Metric Gaps    colonoscopy, tdap     HPI Pt is here for a sick visit. He reports left foot pain, that has been getting worse off the last two months.  He reports edema and swelling in his foot and lower leg. This pain is worse with walking, and he reports moderate swelling at times.        Current Medication:  Outpatient Encounter Medications as of 11/20/2019  Medication Sig  . albuterol (VENTOLIN HFA) 108 (90 Base) MCG/ACT inhaler Inhale 2 puffs into the lungs every 6 (six) hours as needed for wheezing or shortness of breath.  Marland Kitchen amitriptyline (ELAVIL) 50 MG tablet Take 1 tablet (50 mg total) by mouth at bedtime.  Marland Kitchen amLODipine (NORVASC) 10 MG tablet Take 1 tablet (10 mg total) by mouth daily.  Marland Kitchen ipratropium-albuterol (DUONEB) 0.5-2.5 (3) MG/3ML SOLN Take 3 mLs by nebulization every 6 (six) hours as needed.  Marland Kitchen levothyroxine (SYNTHROID) 150 MCG tablet Take 1 tablet (150 mcg total) by mouth daily.  Marland Kitchen lisinopril (ZESTRIL) 40 MG tablet TAKE ONE TABLET BY MOUTH EVERY DAY  . loperamide (IMODIUM A-D) 2 MG tablet Take 1 tablet (2 mg total) by mouth 4 (four) times daily as needed for diarrhea or loose stools.  . paliperidone (INVEGA) 9 MG 24 hr tablet Take 9 mg by mouth daily.  . pantoprazole (PROTONIX) 40 MG tablet TAKE ONE TABLET BY MOUTH EVERY DAY  . pravastatin (PRAVACHOL) 10 MG tablet Take 1 tablet (10 mg total) by mouth daily.  . predniSONE (DELTASONE) 10 MG tablet Use per dose pack  . propranolol (INDERAL) 10 MG tablet TAKE ONE TABLET BY MOUTH THREE TIMES A DAY  . solifenacin (VESICARE) 10 MG  tablet Take 1 tablet (10 mg total) by mouth daily.  . STOOL SOFTENER 100 MG capsule TAKE ONE CAPSULE BY MOUTH EVERY DAY  . tamsulosin (FLOMAX) 0.4 MG CAPS capsule Take 1 capsule (0.4 mg total) by mouth daily.  . traZODone (DESYREL) 100 MG tablet Take 100 mg by mouth at bedtime.  Marland Kitchen venlafaxine XR (EFFEXOR-XR) 75 MG 24 hr capsule Take 1 capsule (75 mg total) by mouth daily with breakfast.   No facility-administered encounter medications on file as of 11/20/2019.      Medical History: Past Medical History:  Diagnosis Date  . Acute kidney injury ([place]) 11/20/2014  . ARF (acute renal failure) ([place]) 11/06/2014  . Asthma   . Bipolar disorder ([place])   . COPD (chronic obstructive pulmonary disease) ([place])   . Depression   . Gastritis 02/07/2015  . GERD (gastroesophageal reflux disease)   . GI bleeding 09/05/2015  . HLD (hyperlipidemia)   . Hypertension   . Hypokalemia 11/06/2014  . Hyponatremia 02/07/2015  . Hypotension 11/06/2014  . Hypothyroid   . Irritable bowel syndrome (IBS)   . Non compliance w medication regimen 09/16/2014  . Schizophrenia ([place])      Vital Signs: BP 115/73   Pulse 76   Temp (!) 97.4 F (36.3 C)   Resp 16   Ht 5\' 7"  (1.702 m)  Wt 185 lb (83.9 kg)   SpO2 97%   BMI 28.98 kg/m    Review of Systems  Constitutional: Negative.  Negative for chills, fatigue and unexpected weight change.  HENT: Negative.  Negative for congestion, rhinorrhea, sneezing and sore throat.   Eyes: Negative for redness.  Respiratory: Negative.  Negative for cough, chest tightness and shortness of breath.   Cardiovascular: Negative.  Negative for chest pain and palpitations.  Gastrointestinal: Negative.  Negative for abdominal pain, constipation, diarrhea, nausea and vomiting.  Endocrine: Negative.   Genitourinary: Negative.  Negative for dysuria and frequency.  Musculoskeletal: Negative.  Negative for arthralgias, back pain, joint swelling and neck pain.  Skin: Negative.  Negative for  rash.  Allergic/Immunologic: Negative.   Neurological: Negative.  Negative for tremors and numbness.  Hematological: Negative for adenopathy. Does not bruise/bleed easily.  Psychiatric/Behavioral: Negative.  Negative for behavioral problems, sleep disturbance and suicidal ideas. The patient is not nervous/anxious.     Physical Exam Vitals and nursing note reviewed.  Constitutional:      General: He is not in acute distress.    Appearance: He is well-developed. He is not diaphoretic.  HENT:     Head: Normocephalic and atraumatic.     Mouth/Throat:     Pharynx: No oropharyngeal exudate.  Eyes:     Pupils: Pupils are equal, round, and reactive to light.  Neck:     Thyroid: No thyromegaly.     Vascular: No JVD.     Trachea: No tracheal deviation.  Cardiovascular:     Rate and Rhythm: Normal rate and regular rhythm.     Heart sounds: Normal heart sounds. No murmur heard.  No friction rub. No gallop.   Pulmonary:     Effort: Pulmonary effort is normal. No respiratory distress.     Breath sounds: Normal breath sounds. No wheezing or rales.  Chest:     Chest wall: No tenderness.  Abdominal:     Palpations: Abdomen is soft.     Tenderness: There is no abdominal tenderness. There is no guarding.  Musculoskeletal:        General: Normal range of motion.     Cervical back: Normal range of motion and neck supple.  Lymphadenopathy:     Cervical: No cervical adenopathy.  Skin:    General: Skin is warm and dry.  Neurological:     Mental Status: He is alert and oriented to person, place, and time.     Cranial Nerves: No cranial nerve deficit.  Psychiatric:        Behavior: Behavior normal.        Thought Content: Thought content normal.        Judgment: Judgment normal.    Assessment/Plan: 1. Left foot pain Have X ray and assess.  - DG Foot Complete Left; Future  2. Screen for colon cancer - Ambulatory referral to Gastroenterology  3. Leg edema, left Will get venous US for  edema.   - US Venous Img Lower Unilateral Left; Future  General Counseling: dorsel flinn understanding of the findings of todays visit and agrees with plan of treatment. I have discussed any further diagnostic evaluation that may be needed or ordered today. We also reviewed his medications today. he has been encouraged to call the office with any questions or concerns that should arise related to todays visit.   Orders Placed This Encounter  Procedures  . DG Foot Complete Left  . US Venous Img Lower Unilateral Left  . Ambulatory  referral to Gastroenterology    No orders of the defined types were placed in this encounter.   Time spent: 30 Minutes  This patient was seen by Orson Gear AGNP-C in Collaboration with Dr Lavera Guise as a part of collaborative care agreement.  Kendell Bane AGNP-C Internal Medicine

## 2019-12-01 ENCOUNTER — Telehealth (INDEPENDENT_AMBULATORY_CARE_PROVIDER_SITE_OTHER): Payer: Self-pay | Admitting: Gastroenterology

## 2019-12-01 ENCOUNTER — Other Ambulatory Visit: Payer: Self-pay

## 2019-12-01 DIAGNOSIS — Z1211 Encounter for screening for malignant neoplasm of colon: Secondary | ICD-10-CM

## 2019-12-01 MED ORDER — NA SULFATE-K SULFATE-MG SULF 17.5-3.13-1.6 GM/177ML PO SOLN: 1.0000 | Freq: Once | ORAL | 0 refills | Status: AC

## 2019-12-01 NOTE — Progress Notes (Signed)
Gastroenterology Pre-Procedure Review  Request Date: Monday 12/22/19 Requesting Physician: Dr. Marius Ditch  PATIENT REVIEW QUESTIONS: The patient responded to the following health history questions as indicated:    1. Are you having any GI issues? no 2. Do you have a personal history of Polyps? no 3. Do you have a family history of Colon Cancer or Polyps? no 4. Diabetes Mellitus? no 5. Joint replacements in the past 12 months?no 6. Major health problems in the past 3 months?no 7. Any artificial heart valves, MVP, or defibrillator?no    MEDICATIONS & ALLERGIES:    Patient reports the following regarding taking any anticoagulation/antiplatelet therapy:   Plavix, Coumadin, Eliquis, Xarelto, Lovenox, Pradaxa, Brilinta, or Effient? no Aspirin? no  Patient confirms/reports the following medications:  Current Outpatient Medications  Medication Sig Dispense Refill  . albuterol (VENTOLIN HFA) 108 (90 Base) MCG/ACT inhaler Inhale 2 puffs into the lungs every 6 (six) hours as needed for wheezing or shortness of breath. 1 Inhaler 3  . amitriptyline (ELAVIL) 50 MG tablet Take 1 tablet (50 mg total) by mouth at bedtime. 30 tablet 0  . amLODipine (NORVASC) 10 MG tablet Take 1 tablet (10 mg total) by mouth daily. 30 tablet 6  . cyclobenzaprine (FLEXERIL) 5 MG tablet 1/2-1 po bid prn    . hydrochlorothiazide (HYDRODIURIL) 25 MG tablet Take by mouth.    Marland Kitchen ipratropium-albuterol (DUONEB) 0.5-2.5 (3) MG/3ML SOLN Take 3 mLs by nebulization every 6 (six) hours as needed. 360 mL 1  . levothyroxine (SYNTHROID) 150 MCG tablet Take 1 tablet (150 mcg total) by mouth daily. 90 tablet 3  . lisinopril (ZESTRIL) 40 MG tablet TAKE ONE TABLET BY MOUTH EVERY DAY 90 tablet 1  . loperamide (IMODIUM A-D) 2 MG tablet Take 1 tablet (2 mg total) by mouth 4 (four) times daily as needed for diarrhea or loose stools. 30 tablet 0  . paliperidone (INVEGA) 9 MG 24 hr tablet Take 9 mg by mouth daily.    . pantoprazole (PROTONIX) 40 MG  tablet TAKE ONE TABLET BY MOUTH EVERY DAY 90 tablet 1  . pravastatin (PRAVACHOL) 10 MG tablet Take 1 tablet (10 mg total) by mouth daily. 30 tablet 6  . predniSONE (DELTASONE) 10 MG tablet Use per dose pack 21 tablet 0  . propranolol (INDERAL) 10 MG tablet TAKE ONE TABLET BY MOUTH THREE TIMES A DAY 90 tablet 1  . solifenacin (VESICARE) 10 MG tablet Take 1 tablet (10 mg total) by mouth daily. 90 tablet 2  . STOOL SOFTENER 100 MG capsule TAKE ONE CAPSULE BY MOUTH EVERY DAY 30 capsule 3  . tamsulosin (FLOMAX) 0.4 MG CAPS capsule Take 1 capsule (0.4 mg total) by mouth daily. 90 capsule 2  . traZODone (DESYREL) 100 MG tablet Take 100 mg by mouth at bedtime.    Marland Kitchen venlafaxine XR (EFFEXOR-XR) 75 MG 24 hr capsule Take 1 capsule (75 mg total) by mouth daily with breakfast. 30 capsule 0   No current facility-administered medications for this visit.    Patient confirms/reports the following allergies:  Allergies  Allergen Reactions  . Aspirin Nausea And Vomiting and Swelling    No orders of the defined types were placed in this encounter.   AUTHORIZATION INFORMATION Primary Insurance: 1D#: Group #:  Secondary Insurance: 1D#: Group #:  SCHEDULE INFORMATION: Date: 08/30/21Time: Location:[facility]

## 2019-12-03 ENCOUNTER — Encounter: Payer: Self-pay | Admitting: Urology

## 2019-12-03 ENCOUNTER — Other Ambulatory Visit: Payer: Self-pay

## 2019-12-03 ENCOUNTER — Ambulatory Visit (INDEPENDENT_AMBULATORY_CARE_PROVIDER_SITE_OTHER): Payer: Medicaid Other | Admitting: Urology

## 2019-12-03 VITALS — BP 98/61 | HR 73 | Ht 67.0 in | Wt 183.0 lb

## 2019-12-03 DIAGNOSIS — N5201 Erectile dysfunction due to arterial insufficiency: Secondary | ICD-10-CM

## 2019-12-03 NOTE — Progress Notes (Signed)
12/03/2019 1:16 PM   Christopher Mason 1960/11/17 [id number]  Referring provider: Kendell Bane, NP [place],  [place] [id number]  Chief Complaint  Patient presents with  . Erectile Dysfunction    HPI: 59 y.o. male seen for follow-up of erectile dysfunction.   Initially seen August 2020 for severe ED  Significant organic risk factors including hypertension, hyperlipidemia, depression, antihypertensive medications, psychotropic medications and tobacco use  Intracavernosal injections and vacuum erection devices were discussed and he was provided literature  He was to call back if interested in proceeding with treatment  He is interested in a vacuum erection device        PMH: Past Medical History:  Diagnosis Date  . Acute kidney injury ([place]) 11/20/2014  . ARF (acute renal failure) ([place]) 11/06/2014  . Asthma   . Bipolar disorder ([place])   . COPD (chronic obstructive pulmonary disease) ([place])   . Depression   . Gastritis 02/07/2015  . GERD (gastroesophageal reflux disease)   . GI bleeding 09/05/2015  . HLD (hyperlipidemia)   . Hypertension   . Hypokalemia 11/06/2014  . Hyponatremia 02/07/2015  . Hypotension 11/06/2014  . Hypothyroid   . Irritable bowel syndrome (IBS)   . Non compliance w medication regimen 09/16/2014  . Schizophrenia [facility])     Surgical History: Past Surgical History:  Procedure Laterality Date  . BACK SURGERY    . CARPAL TUNNEL RELEASE Bilateral   . CHOLECYSTECTOMY    . ESOPHAGOGASTRODUODENOSCOPY (EGD) WITH PROPOFOL N/A 09/06/2015   Procedure: ESOPHAGOGASTRODUODENOSCOPY (EGD) WITH PROPOFOL;  Surgeon: Hulen Luster, MD;  [facility];  Service: Endoscopy;  Laterality: N/A;  . ROTATOR CUFF REPAIR     2007    Home Medications:  Allergies as of 12/03/2019      Reactions   Aspirin Nausea And Vomiting, Swelling      Medication List       Accurate as of December 03, 2019  1:16 PM. If you have any questions, ask your nurse or  doctor.        albuterol 108 (90 Base) MCG/ACT inhaler Commonly known as: VENTOLIN HFA Inhale 2 puffs into the lungs every 6 (six) hours as needed for wheezing or shortness of breath.   amitriptyline 50 MG tablet Commonly known as: ELAVIL Take 1 tablet (50 mg total) by mouth at bedtime.   amLODipine 10 MG tablet Commonly known as: Norvasc Take 1 tablet (10 mg total) by mouth daily.   cyclobenzaprine 5 MG tablet Commonly known as: FLEXERIL 1/2-1 po bid prn   hydrochlorothiazide 25 MG tablet Commonly known as: HYDRODIURIL Take by mouth.   ipratropium-albuterol 0.5-2.5 (3) MG/3ML Soln Commonly known as: DUONEB Take 3 mLs by nebulization every 6 (six) hours as needed.   levothyroxine 150 MCG tablet Commonly known as: SYNTHROID Take 1 tablet (150 mcg total) by mouth daily.   lisinopril 40 MG tablet Commonly known as: ZESTRIL TAKE ONE TABLET BY MOUTH EVERY DAY   loperamide 2 MG tablet Commonly known as: IMODIUM A-D Take 1 tablet (2 mg total) by mouth 4 (four) times daily as needed for diarrhea or loose stools.   paliperidone 9 MG 24 hr tablet Commonly known as: INVEGA Take 9 mg by mouth daily.   pantoprazole 40 MG tablet Commonly known as: PROTONIX TAKE ONE TABLET BY MOUTH EVERY DAY   pravastatin 10 MG tablet Commonly known as: PRAVACHOL Take 1 tablet (10 mg total) by mouth daily.   predniSONE 10 MG tablet Commonly  known as: DELTASONE Use per dose pack   propranolol 10 MG tablet Commonly known as: INDERAL TAKE ONE TABLET BY MOUTH THREE TIMES A DAY   solifenacin 10 MG tablet Commonly known as: VESICARE Take 1 tablet (10 mg total) by mouth daily.   Stool Softener 100 MG capsule Generic drug: docusate sodium TAKE ONE CAPSULE BY MOUTH EVERY DAY   tamsulosin 0.4 MG Caps capsule Commonly known as: FLOMAX Take 1 capsule (0.4 mg total) by mouth daily.   traZODone 100 MG tablet Commonly known as: DESYREL Take 100 mg by mouth at bedtime.   venlafaxine XR 75  MG 24 hr capsule Commonly known as: EFFEXOR-XR Take 1 capsule (75 mg total) by mouth daily with breakfast.       Allergies:  Allergies  Allergen Reactions  . Aspirin Nausea And Vomiting and Swelling    Family History: Family History  Problem Relation Age of Onset  . Hypertension Father   . Cataracts Father   . Diabetes Sister   . Diabetes Brother   . Dementia Mother     Social History:  reports that he has been smoking cigarettes. He has been smoking about 1.00 pack per day. He has never used smokeless tobacco. He reports current drug use. Frequency: 1.00 time per week. Drug: Marijuana. He reports that he does not drink alcohol.   Physical Exam: BP 98/61   Pulse 73   Ht 5\' 7"  (1.702 m)   Wt 183 lb (83 kg)   BMI 28.66 kg/m   Constitutional:  Alert and oriented, No acute distress.   Assessment & Plan:    1.  Erectile dysfunction  Significant organic risk factors and failed PDE 5 therapy  He is not interested in intracavernosal injections or penile implant surgery  He is interested in a vacuum erection device and was given a prescription along with the [place] website    Abbie Sons, [facility] [address], [place], [place] [address] [phone]

## 2019-12-07 ENCOUNTER — Encounter: Payer: Self-pay | Admitting: Urology

## 2019-12-10 ENCOUNTER — Telehealth: Payer: Self-pay

## 2019-12-10 NOTE — Telephone Encounter (Signed)
Confirmed [place] on 8/20

## 2019-12-12 ENCOUNTER — Ambulatory Visit (INDEPENDENT_AMBULATORY_CARE_PROVIDER_SITE_OTHER): Payer: Medicaid Other

## 2019-12-12 ENCOUNTER — Other Ambulatory Visit: Payer: Self-pay

## 2019-12-12 DIAGNOSIS — R600 Localized edema: Secondary | ICD-10-CM | POA: Diagnosis not present

## 2019-12-16 ENCOUNTER — Telehealth: Payer: Self-pay

## 2019-12-16 NOTE — Telephone Encounter (Signed)
Confirmed office visit for 8/26

## 2019-12-18 ENCOUNTER — Other Ambulatory Visit
Admission: RE | Admit: 2019-12-18 | Discharge: 2019-12-18 | Disposition: A | Discharge status: Home / Self Care | Payer: Medicaid Other | Source: Ambulatory Visit | Attending: Gastroenterology | Admitting: Gastroenterology

## 2019-12-18 ENCOUNTER — Ambulatory Visit: Payer: Medicaid Other | Admitting: Hospice and Palliative Medicine

## 2019-12-18 ENCOUNTER — Other Ambulatory Visit: Payer: Self-pay

## 2019-12-18 ENCOUNTER — Encounter: Payer: Self-pay | Admitting: Hospice and Palliative Medicine

## 2019-12-18 VITALS — BP 104/68 | HR 76 | Temp 98.2°F | Resp 16 | Ht 67.0 in | Wt 182.8 lb

## 2019-12-18 DIAGNOSIS — M79672 Pain in left foot: Secondary | ICD-10-CM | POA: Diagnosis not present

## 2019-12-18 DIAGNOSIS — Z01812 Encounter for preprocedural laboratory examination: Secondary | ICD-10-CM | POA: Diagnosis present

## 2019-12-18 DIAGNOSIS — J449 Chronic obstructive pulmonary disease, unspecified: Secondary | ICD-10-CM | POA: Diagnosis not present

## 2019-12-18 DIAGNOSIS — Z20822 Contact with and (suspected) exposure to covid-19: Secondary | ICD-10-CM | POA: Insufficient documentation

## 2019-12-18 DIAGNOSIS — K4091 Unilateral inguinal hernia, without obstruction or gangrene, recurrent: Secondary | ICD-10-CM

## 2019-12-18 LAB — SARS CORONAVIRUS 2 (TAT 6-24 HRS): SARS Coronavirus 2: NEGATIVE

## 2019-12-18 MED ORDER — IPRATROPIUM-ALBUTEROL 0.5-2.5 (3) MG/3ML IN SOLN: 3.0000 mL | Freq: Four times a day (QID) | RESPIRATORY_TRACT | 1 refills | Status: DC | PRN
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
## 2019-12-19 ENCOUNTER — Telehealth: Payer: Self-pay

## 2019-12-19 ENCOUNTER — Encounter: Payer: Self-pay | Admitting: Hospice and Palliative Medicine

## 2019-12-19 NOTE — Telephone Encounter (Signed)
Called pt and informed him that his long term care services form was completed and at front desk to pick up. dbs

## 2019-12-22 ENCOUNTER — Encounter: Payer: Self-pay | Admitting: Gastroenterology

## 2019-12-22 ENCOUNTER — Ambulatory Visit
Admission: RE | Admit: 2019-12-22 | Discharge: 2019-12-22 | Disposition: A | Discharge status: Home / Self Care | Payer: Medicaid Other | Attending: Gastroenterology | Admitting: Gastroenterology

## 2019-12-22 ENCOUNTER — Other Ambulatory Visit: Payer: Self-pay

## 2019-12-22 ENCOUNTER — Ambulatory Visit: Payer: Medicaid Other | Admitting: Anesthesiology

## 2019-12-22 ENCOUNTER — Encounter
Admission: RE | Disposition: A | Discharge status: Home / Self Care | Payer: Self-pay | Source: Home / Self Care | Attending: Gastroenterology

## 2019-12-22 DIAGNOSIS — Z7951 Long term (current) use of inhaled steroids: Secondary | ICD-10-CM | POA: Diagnosis not present

## 2019-12-22 DIAGNOSIS — K219 Gastro-esophageal reflux disease without esophagitis: Secondary | ICD-10-CM | POA: Diagnosis not present

## 2019-12-22 DIAGNOSIS — F17210 Nicotine dependence, cigarettes, uncomplicated: Secondary | ICD-10-CM | POA: Insufficient documentation

## 2019-12-22 DIAGNOSIS — D122 Benign neoplasm of ascending colon: Secondary | ICD-10-CM | POA: Insufficient documentation

## 2019-12-22 DIAGNOSIS — Z820 Family history of epilepsy and other diseases of the nervous system: Secondary | ICD-10-CM | POA: Insufficient documentation

## 2019-12-22 DIAGNOSIS — Z833 Family history of diabetes mellitus: Secondary | ICD-10-CM | POA: Insufficient documentation

## 2019-12-22 DIAGNOSIS — Z1211 Encounter for screening for malignant neoplasm of colon: Secondary | ICD-10-CM | POA: Insufficient documentation

## 2019-12-22 DIAGNOSIS — D120 Benign neoplasm of cecum: Secondary | ICD-10-CM | POA: Insufficient documentation

## 2019-12-22 DIAGNOSIS — K625 Hemorrhage of anus and rectum: Secondary | ICD-10-CM

## 2019-12-22 DIAGNOSIS — Z9049 Acquired absence of other specified parts of digestive tract: Secondary | ICD-10-CM | POA: Diagnosis not present

## 2019-12-22 DIAGNOSIS — R0602 Shortness of breath: Secondary | ICD-10-CM | POA: Insufficient documentation

## 2019-12-22 DIAGNOSIS — F319 Bipolar disorder, unspecified: Secondary | ICD-10-CM | POA: Insufficient documentation

## 2019-12-22 DIAGNOSIS — D123 Benign neoplasm of transverse colon: Secondary | ICD-10-CM | POA: Diagnosis not present

## 2019-12-22 DIAGNOSIS — E785 Hyperlipidemia, unspecified: Secondary | ICD-10-CM | POA: Insufficient documentation

## 2019-12-22 DIAGNOSIS — E039 Hypothyroidism, unspecified: Secondary | ICD-10-CM | POA: Diagnosis not present

## 2019-12-22 DIAGNOSIS — Z8249 Family history of ischemic heart disease and other diseases of the circulatory system: Secondary | ICD-10-CM | POA: Insufficient documentation

## 2019-12-22 DIAGNOSIS — I10 Essential (primary) hypertension: Secondary | ICD-10-CM | POA: Insufficient documentation

## 2019-12-22 DIAGNOSIS — J449 Chronic obstructive pulmonary disease, unspecified: Secondary | ICD-10-CM | POA: Diagnosis not present

## 2019-12-22 DIAGNOSIS — K644 Residual hemorrhoidal skin tags: Secondary | ICD-10-CM | POA: Insufficient documentation

## 2019-12-22 DIAGNOSIS — Z7982 Long term (current) use of aspirin: Secondary | ICD-10-CM | POA: Insufficient documentation

## 2019-12-22 DIAGNOSIS — Z886 Allergy status to analgesic agent status: Secondary | ICD-10-CM | POA: Insufficient documentation

## 2019-12-22 DIAGNOSIS — K589 Irritable bowel syndrome without diarrhea: Secondary | ICD-10-CM | POA: Diagnosis not present

## 2019-12-22 DIAGNOSIS — Z79899 Other long term (current) drug therapy: Secondary | ICD-10-CM | POA: Diagnosis not present

## 2019-12-22 DIAGNOSIS — F209 Schizophrenia, unspecified: Secondary | ICD-10-CM | POA: Diagnosis not present

## 2019-12-22 DIAGNOSIS — Z7952 Long term (current) use of systemic steroids: Secondary | ICD-10-CM | POA: Diagnosis not present

## 2019-12-22 HISTORY — PX: COLONOSCOPY WITH PROPOFOL: SHX5780

## 2019-12-22 SURGERY — COLONOSCOPY WITH PROPOFOL
Anesthesia: General

## 2019-12-22 MED ORDER — PROPOFOL 500 MG/50ML IV EMUL
INTRAVENOUS | Status: DC | PRN
  Administered 2019-12-22: 150 ug/kg/min via INTRAVENOUS

## 2019-12-22 MED ORDER — PHENYLEPHRINE HCL (PRESSORS) 10 MG/ML IV SOLN
INTRAVENOUS | Status: DC | PRN
  Administered 2019-12-22: 100 ug via INTRAVENOUS
  Administered 2019-12-22: 200 ug via INTRAVENOUS
  Administered 2019-12-22: 100 ug via INTRAVENOUS
  Administered 2019-12-22: 200 ug via INTRAVENOUS

## 2019-12-22 MED ORDER — SODIUM CHLORIDE 0.9 % IV SOLN
INTRAVENOUS | Status: DC
  Administered 2019-12-22: 20 mL/h via INTRAVENOUS

## 2019-12-22 MED ORDER — PROPOFOL 10 MG/ML IV BOLUS
INTRAVENOUS | Status: DC | PRN
  Administered 2019-12-22: 10 mg via INTRAVENOUS
  Administered 2019-12-22: 80 mg via INTRAVENOUS

## 2019-12-22 MED ORDER — LIDOCAINE HCL (CARDIAC) PF 100 MG/5ML IV SOSY
PREFILLED_SYRINGE | INTRAVENOUS | Status: DC | PRN
  Administered 2019-12-22: 40 mg via INTRAVENOUS

## 2019-12-22 NOTE — Op Note (Signed)
[facility] Patient Name: Christopher Mason Procedure Date: 12/22/2019 11:13 AM MRN: [id number] Account #: [id number] Date of Birth: 03/05/1961 Admit Type: Outpatient Age: 59 Room: [facility] ENDO ROOM 1 Gender: Male Note Status: Finalized Procedure:             Colonoscopy Indications:           Screening for colorectal malignant neoplasm Providers:             Lin Landsman MD, MD Referring MD:          Orson Gear NP Medicines:             Monitored Anesthesia Care Complications:         No immediate complications. Estimated blood loss: None. Procedure:             Pre-Anesthesia Assessment:                        - Prior to the procedure, a History and Physical was                         performed, and patient medications and allergies were                         reviewed. The patient is competent. The risks and                         benefits of the procedure and the sedation options and                         risks were discussed with the patient. All questions                         were answered and informed consent was obtained.                         Patient identification and proposed procedure were                         verified by the physician, the nurse, the                         anesthesiologist, the anesthetist and the technician                         in the pre-procedure area in the procedure room in the                         endoscopy suite. Mental Status Examination: alert and                         oriented. Airway Examination: normal oropharyngeal                         airway and neck mobility. Respiratory Examination:                         clear to auscultation. CV Examination: normal.  Prophylactic Antibiotics: The patient does not require                         prophylactic antibiotics. Prior Anticoagulants: The                         patient has taken no previous anticoagulant or                          antiplatelet agents. ASA Grade Assessment: III - A                         patient with severe systemic disease. After reviewing                         the risks and benefits, the patient was deemed in                         satisfactory condition to undergo the procedure. The                         anesthesia plan was to use monitored anesthesia care                         (MAC). Immediately prior to administration of                         medications, the patient was re-assessed for adequacy                         to receive sedatives. The heart rate, respiratory                         rate, oxygen saturations, blood pressure, adequacy of                         pulmonary ventilation, and response to care were                         monitored throughout the procedure. The physical                         status of the patient was re-assessed after the                         procedure.                        After obtaining informed consent, the colonoscope was                         passed under direct vision. Throughout the procedure,                         the patient's blood pressure, pulse, and oxygen                         saturations were monitored continuously. The  Colonoscope was introduced through the anus and                         advanced to the the cecum, identified by appendiceal                         orifice and ileocecal valve. The colonoscopy was                         extremely difficult due to inadequate bowel prep,                         significant looping, the patient's respiratory                         instability (bronchospasm) and the patient's                         respiratory instability (hypoxia). Successful                         completion of the procedure was aided by managing the                         patient's medical instability. The patient tolerated                         the  procedure well. The quality of the bowel                         preparation was evaluated using the BBPS [facility] Bowel                         Preparation Scale) with scores of: Right Colon = 2                         (minor amount of residual staining, small fragments of                         stool and/or opaque liquid, but mucosa seen well),                         Transverse Colon = 2 (minor amount of residual                         staining, small fragments of stool and/or opaque                         liquid, but mucosa seen well) and Left Colon = 2                         (minor amount of residual staining, small fragments of                         stool and/or opaque liquid, but mucosa seen well). The                         total BBPS  score equals 6. Findings:      The perianal and digital rectal examinations were normal. Pertinent       negatives include normal sphincter tone and no palpable rectal lesions.      Two sessile polyps were found in the cecum. The polyps were 4 to 5 mm in       size. These polyps were removed with a cold snare. Resection and       retrieval were complete.      Two sessile polyps were found in the ascending colon. The polyps were 4       to 6 mm in size. These polyps were removed with a cold snare. Resection       and retrieval were complete.      A 10 mm polyp was found in the ascending colon. The polyp was sessile.       The polyp was removed with a hot snare. Resection and retrieval were       complete.      Six sessile polyps were found in the transverse colon. The polyps were 5       to 10 mm in size. These polyps were removed with a hot snare. Resection       and retrieval were complete.      Non-bleeding external hemorrhoids were found during endoscopy. The       hemorrhoids were medium-sized.      A single medium-sized localized angiodysplastic lesion without bleeding       was found in the ascending colon. Impression:            - Two  4 to 5 mm polyps in the cecum, removed with a                         cold snare. Resected and retrieved.                        - Two 4 to 6 mm polyps in the ascending colon, removed                         with a cold snare. Resected and retrieved.                        - One 10 mm polyp in the ascending colon, removed with                         a hot snare. Resected and retrieved.                        - Six 5 to 10 mm polyps in the transverse colon,                         removed with a hot snare. Resected and retrieved.                        - Non-bleeding external hemorrhoids. Recommendation:        - Discharge patient to home (with escort).                        - Resume previous diet today.                        -  Continue present medications.                        - Await pathology results.                        - Repeat colonoscopy in 3 years for surveillance. Procedure Code(s):     --- Professional ---                        [phone], Colonoscopy, flexible; with removal of                         tumor(s), polyp(s), or other lesion(s) by snare                         technique Diagnosis Code(s):     --- Professional ---                        K63.5, Polyp of colon                        Z12.11, Encounter for screening for malignant neoplasm                         of colon                        K64.4, Residual hemorrhoidal skin tags CPT copyright 2019 American Medical Association. All rights reserved. The codes documented in this report are preliminary and upon coder review may  be revised to meet current compliance requirements. Dr. Ulyess Mort Lin Landsman MD, MD 12/22/2019 12:25:25 PM This report has been signed electronically. Number of Addenda: 0 Note Initiated On: 12/22/2019 11:13 AM Scope Withdrawal Time: 0 hours 23 minutes 6 seconds  Total Procedure Duration: 0 hours 52 minutes 52 seconds  Estimated Blood Loss:  Estimated blood loss: none.       [facility]

## 2019-12-22 NOTE — Anesthesia Procedure Notes (Signed)
Date/Time: 12/22/2019 11:51 AM Performed by: Doreen Salvage, CRNA Pre-anesthesia Checklist: Patient identified, Emergency Drugs available, Suction available and Patient being monitored Patient Re-evaluated:Patient Re-evaluated prior to induction Oxygen Delivery Method: Supernova nasal CPAP Induction Type: IV induction Dental Injury: Teeth and Oropharynx as per pre-operative assessment  Comments: Nasal cannula with etCO2 monitoring

## 2019-12-22 NOTE — Anesthesia Preprocedure Evaluation (Signed)
Anesthesia Evaluation  Patient identified by MRN, date of birth, ID band Patient awake    Reviewed: Allergy & Precautions, H&P , NPO status , Patient's Chart, lab work & pertinent test results  History of Anesthesia Complications Negative for: history of anesthetic complications  Airway Mallampati: III  TM Distance: >3 FB Neck ROM: limited    Dental  (+) Poor Dentition, Edentulous Upper, Edentulous Lower   Pulmonary shortness of breath and with exertion, asthma , COPD, Current Smoker and Patient abstained from smoking.,    Pulmonary exam normal        Cardiovascular Exercise Tolerance: Good hypertension, (-) angina(-) Past MI Normal cardiovascular exam     Neuro/Psych PSYCHIATRIC DISORDERS negative neurological ROS     GI/Hepatic Neg liver ROS, GERD  Medicated and Controlled,  Endo/Other  Hypothyroidism   Renal/GU Renal disease  negative genitourinary   Musculoskeletal   Abdominal   Peds  Hematology negative hematology ROS (+)   Anesthesia Other Findings Past Medical History: 11/20/2014: Acute kidney injury ([place]) 11/06/2014: ARF (acute renal failure) (HCC) No date: Asthma No date: Bipolar disorder ([place]) No date: COPD (chronic obstructive pulmonary disease) (HCC) No date: Depression 02/07/2015: Gastritis No date: GERD (gastroesophageal reflux disease) 09/05/2015: GI bleeding No date: HLD (hyperlipidemia) No date: Hypertension 11/06/2014: Hypokalemia 02/07/2015: Hyponatremia 11/06/2014: Hypotension No date: Hypothyroid No date: Irritable bowel syndrome (IBS) 09/16/2014: Non compliance w medication regimen No date: Schizophrenia [facility])  Past Surgical History: No date: BACK SURGERY No date: CARPAL TUNNEL RELEASE; Bilateral No date: CHOLECYSTECTOMY 09/06/2015: ESOPHAGOGASTRODUODENOSCOPY (EGD) WITH PROPOFOL; N/A     Comment:  Procedure: ESOPHAGOGASTRODUODENOSCOPY (EGD) WITH               PROPOFOL;  Surgeon: Hulen Luster, MD;  Location: [facility]               ENDOSCOPY;  Service: Endoscopy;  Laterality: N/A; No date: ROTATOR CUFF REPAIR     Comment:  2007  BMI    Body Mass Index: 28.57 kg/m      Reproductive/Obstetrics negative OB ROS                             Anesthesia Physical Anesthesia Plan  ASA: III  Anesthesia Plan: General   Post-op Pain Management:    Induction: Intravenous  PONV Risk Score and Plan: Propofol infusion and TIVA  Airway Management Planned: Natural Airway and Nasal Cannula  Additional Equipment:   Intra-op Plan:   Post-operative Plan:   Informed Consent: I have reviewed the patients History and Physical, chart, labs and discussed the procedure including the risks, benefits and alternatives for the proposed anesthesia with the patient or authorized representative who has indicated his/her understanding and acceptance.     Dental Advisory Given  Plan Discussed with: Anesthesiologist, CRNA and Surgeon  Anesthesia Plan Comments: (Patient consented for risks of anesthesia including but not limited to:  - adverse reactions to medications - risk of intubation if required - damage to eyes, teeth, lips or other oral mucosa - nerve damage due to positioning  - sore throat or hoarseness - Damage to heart, brain, nerves, lungs, other parts of body or loss of life  Patient voiced understanding.)        Anesthesia Quick Evaluation

## 2019-12-22 NOTE — Transfer of Care (Signed)
Immediate Anesthesia Transfer of Care Note  Patient: Christopher Mason  Procedure(s) Performed: Procedure(s): COLONOSCOPY WITH PROPOFOL (N/A)  Patient Location: PACU and Endoscopy Unit  Anesthesia Type:General  Level of Consciousness: sedated  Airway & Oxygen Therapy: Patient Spontanous Breathing and Patient connected to nasal cannula oxygen  Post-op Assessment: Report given to RN and Post -op Vital signs reviewed and stable  Post vital signs: Reviewed and stable  Last Vitals:  Vitals:   12/22/19 0957 12/22/19 1227  BP: 102/72 (!) 84/73  Pulse:  (!) 48  Resp:  14  Temp:  (!) 36.1 C  SpO2:  24%    Complications: No apparent anesthesia complications

## 2019-12-22 NOTE — Anesthesia Postprocedure Evaluation (Signed)
Anesthesia Post Note  Patient: Christopher Mason  Procedure(s) Performed: COLONOSCOPY WITH PROPOFOL (N/A )  Patient location during evaluation: Endoscopy Anesthesia Type: General Level of consciousness: awake and alert Pain management: pain level controlled Vital Signs Assessment: post-procedure vital signs reviewed and stable Respiratory status: spontaneous breathing, nonlabored ventilation, respiratory function stable and patient connected to nasal cannula oxygen Cardiovascular status: blood pressure returned to baseline and stable Postop Assessment: no apparent nausea or vomiting Anesthetic complications: no   No complications documented.   Last Vitals:  Vitals:   12/22/19 1237 12/22/19 1253  BP: (!) 103/59 97/82  Pulse: (!) 53 (!) 51  Resp: (!) 24 12  Temp:    SpO2: 96% 92%    Last Pain:  Vitals:   12/22/19 1253  TempSrc:   PainSc: 0-No pain                 Precious Haws Kimanh Templeman
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
## 2019-12-23 ENCOUNTER — Encounter: Payer: Self-pay | Admitting: Gastroenterology

## 2019-12-23 LAB — SURGICAL PATHOLOGY

## 2019-12-24 ENCOUNTER — Encounter: Payer: Self-pay | Admitting: Gastroenterology

## 2019-12-24 ENCOUNTER — Telehealth: Payer: Self-pay

## 2019-12-24 DIAGNOSIS — Z86010 Personal history of colonic polyps: Secondary | ICD-10-CM

## 2019-12-24 NOTE — Telephone Encounter (Signed)
Called and left a message for call back with the person that answered the phone

## 2019-12-24 NOTE — Telephone Encounter (Signed)
Patient verbalized understanding. He states he would like referral. Placed referral

## 2019-12-24 NOTE — Telephone Encounter (Signed)
-----   Message from Lin Landsman, MD sent at 12/24/2019  1:14 PM EDT ----- Patient has several tubular adenomas of the colon which were precancerous, more than 10 in number. Therefore, recommend referral to genetic counselor  Rohini Vanga

## 2020-01-01 ENCOUNTER — Other Ambulatory Visit: Payer: Self-pay | Admitting: Adult Health

## 2020-01-01 ENCOUNTER — Other Ambulatory Visit: Payer: Self-pay | Admitting: Urology

## 2020-01-01 DIAGNOSIS — E785 Hyperlipidemia, unspecified: Secondary | ICD-10-CM

## 2020-01-01 DIAGNOSIS — I10 Essential (primary) hypertension: Secondary | ICD-10-CM

## 2020-01-05 ENCOUNTER — Other Ambulatory Visit: Payer: Self-pay

## 2020-01-05 ENCOUNTER — Encounter: Payer: Self-pay | Admitting: Licensed Clinical Social Worker

## 2020-01-05 ENCOUNTER — Inpatient Hospital Stay: Payer: Medicaid Other

## 2020-01-05 ENCOUNTER — Inpatient Hospital Stay: Payer: Medicaid Other | Attending: Oncology | Admitting: Licensed Clinical Social Worker

## 2020-01-05 DIAGNOSIS — Z860100 Personal history of colon polyps, unspecified: Secondary | ICD-10-CM
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
## 2020-01-12 ENCOUNTER — Ambulatory Visit: Payer: Self-pay | Admitting: Licensed Clinical Social Worker

## 2020-01-12 ENCOUNTER — Encounter: Payer: Self-pay | Admitting: Licensed Clinical Social Worker

## 2020-01-12 ENCOUNTER — Telehealth: Payer: Self-pay | Admitting: Licensed Clinical Social Worker

## 2020-01-12 DIAGNOSIS — Z1379 Encounter for other screening for genetic and chromosomal anomalies: Secondary | ICD-10-CM

## 2020-01-12 DIAGNOSIS — Z86010 Personal history of colonic polyps: Secondary | ICD-10-CM

## 2020-01-12 HISTORY — DX: Encounter for other screening for genetic and chromosomal anomalies: Z13.79

## 2020-01-12 NOTE — Progress Notes (Addendum)
HPI:  Christopher Mason was previously seen in the [facility] due to a personal history of colon polyps and concerns regarding a hereditary predisposition to cancer. Please refer to our prior cancer genetics clinic note for more information regarding our discussion, assessment and recommendations, at the time. Christopher Mason recent genetic test results were disclosed to him, as were recommendations warranted by these results. These results and recommendations are discussed in more detail below.  CANCER HISTORY:  Oncology History   No history exists.    FAMILY HISTORY:  We obtained a detailed, 4-generation family history.  Significant diagnoses are listed below: Family History  Problem Relation Age of Onset   Hypertension Father    Cataracts Father    Diabetes Sister    Diabetes Brother    Dementia Mother    Christopher Mason does not have children. He has 5 brothers and 2 sisters. None have had cancer or colon polyps that he is aware of.    Christopher Mason mother died at 97, no cancer or polyps. Patient had 3 maternal uncles but patient does not have information about this side of the family.   Christopher Mason father died at 87, no cancer or polyps. He was an only child. Paternal grandmother died at 64, no information about paternal grandfather.    Christopher Mason is unaware of previous family history of genetic testing for hereditary cancer risks. Patient's maternal ancestors are of Irish/[place] descent, and paternal ancestors are of [place] descent. There is no reported Ashkenazi Jewish ancestry. There is no known consanguinity.      GENETIC TEST RESULTS: Genetic testing reported out on 01/11/2020 through the [facility] Multi- cancer panel found no pathogenic mutations.   The Multi-Cancer Panel offered by [facility] includes sequencing and/or deletion duplication testing of the following 85 genes: AIP, ALK, APC, ATM, AXIN2,BAP1,  BARD1, BLM, BMPR1A, BRCA1, BRCA2, BRIP1, CASR, CDC73, CDH1, CDK4,  CDKN1B, CDKN1C, CDKN2A (p14ARF), CDKN2A (p16INK4a), CEBPA, CHEK2, CTNNA1, DICER1, DIS3L2, EGFR (c.2369C>T, p.Thr790Met variant only), EPCAM (Deletion/duplication testing only), FH, FLCN, GATA2, GPC3, GREM1 (Promoter region deletion/duplication testing only), HOXB13 (c.251G>A, p.Gly84Glu), HRAS, KIT, MAX, MEN1, MET, MITF (c.952G>A, p.Glu318Lys variant only), MLH1, MSH2, MSH3, MSH6, MUTYH, NBN, NF1, NF2, NTHL1, PALB2, PDGFRA, PHOX2B, PMS2, POLD1, POLE, POT1, PRKAR1A, PTCH1, PTEN, RAD50, RAD51C, RAD51D, RB1, RECQL4, RET, RNF43, RUNX1, SDHAF2, SDHA (sequence changes only), SDHB, SDHC, SDHD, SMAD4, SMARCA4, SMARCB1, SMARCE1, STK11, SUFU, TERC, TERT, TMEM127, TP53, TSC1, TSC2, VHL, WRN and WT1.   The test report has been scanned into EPIC and is located under the Molecular Pathology section of the Results Review tab.  A portion of the result report is included below for reference.     We discussed with Christopher Mason that because current genetic testing is not perfect, it is possible there may be a gene mutation in one of these genes that current testing cannot detect, but that chance is small.  We also discussed that there could be another gene that has not yet been discovered, or that we have not yet tested, that is responsible for his colon polyps.  It is also possible there is a hereditary cause for the cancer in the family that Christopher Mason did not inherit and therefore was not identified in his testing.  Therefore, it is important to remain in touch with cancer genetics in the future so that we can continue to offer Christopher Mason the most up to date genetic testing.   Genetic testing did identify 2 Variants of uncertain  significance (VUS) - one in the DIS3L2 gene called c.67G>C, a second in the EGFR gene called c.1086C>T.  At this time, it is unknown if these variants are associated with increased cancer risk or if they are normal findings, but most variants such as these get reclassified to being inconsequential. They  should not be used to make medical management decisions. With time, we suspect the lab will determine the significance of these variants, if any. If we do learn more about them, we will try to contact Christopher Mason to discuss it further. However, it is important to stay in touch with [place] periodically and keep the address and phone number up to date.   UPDATE: VUS in EGFR called  c.1086C>T has been reclassified to "Likely Benign." The amended report date is 06/09/2022.  ADDITIONAL GENETIC TESTING: We discussed with Christopher Mason that his genetic testing was fairly extensive.  If there are genes identified to increase cancer risk that can be analyzed in the future, we would be happy to discuss and coordinate this testing at that time.    CANCER SCREENING RECOMMENDATIONS: Christopher Mason test result is considered negative (normal).  This means that we have not identified a hereditary cause for his  Personal history of polyps at this time.   While reassuring, this does not definitively rule out a hereditary predisposition to cancer. It is still possible that there could be genetic mutations that are undetectable by current technology. There could be genetic mutations in genes that have not been tested or identified to increase cancer risk.  Therefore, it is recommended he continue to follow the cancer management and screening guidelines provided by his  primary healthcare provider.    This negative genetic test simply tells [place] that we cannot yet define why Christopher Mason has had  an increased number of colorectal polyps.  Christopher Mason medical management and screening should be based on the prospect that he  will likely form more colon polyps  and should, therefore, undergo more frequent colonoscopy screening at intervals determined by his GI providers.  We also recommended that Christopher Mason have an upper endoscopy periodically.  An individual's cancer risk and medical management are not determined by genetic test results alone.  Overall cancer risk assessment incorporates additional factors, including personal medical history, family history, and any available genetic information that may result in a personalized plan for cancer prevention and surveillance.  RECOMMENDATIONS FOR FAMILY MEMBERS:  Relatives in this family might be at some increased risk of developing cancer, over the general population risk, simply due to the family history of cancer.  We recommended male relatives in this family have a yearly mammogram beginning at age 44, or 58 years younger than the earliest onset of cancer, an annual clinical breast exam, and perform monthly breast self-exams. Male relatives in this family should also have a gynecological exam as recommended by their primary provider.  All family members should be referred for colonoscopy starting at age 41.   FOLLOW-UP: Lastly, we discussed with Christopher Mason that cancer genetics is a rapidly advancing field and it is possible that new genetic tests will be appropriate for him and/or his family members in the future. We encouraged him to remain in contact with cancer genetics on an annual basis so we can update his personal and family histories and let him know of advances in cancer genetics that may benefit this family.   Our contact number was provided. Christopher Mason questions were answered to his satisfaction,  and he knows he is welcome to call us at anytime with additional questions or concerns.   Christopher Rogue, MS, [facility] Genetic Counselor [email or web address] Phone: [phone]

## 2020-01-12 NOTE — Telephone Encounter (Signed)
Revealed negative genetic testing.  Revealed that a VUS in DIS3L2 and EGFR was identified.  We discussed that we do not know why he has had colon polyps.  It could be due to a different gene that we are not testing, or something our current technology cannot pick up.  It will be important for him to keep in contact with genetics to learn if additional testing may be needed in the future.

## 2020-01-29 ENCOUNTER — Other Ambulatory Visit: Payer: Self-pay | Admitting: Adult Health

## 2020-01-29 ENCOUNTER — Other Ambulatory Visit: Payer: Self-pay | Admitting: Internal Medicine

## 2020-01-29 DIAGNOSIS — K5900 Constipation, unspecified: Secondary | ICD-10-CM

## 2020-03-02 ENCOUNTER — Other Ambulatory Visit: Payer: Self-pay

## 2020-03-02 ENCOUNTER — Ambulatory Visit: Payer: Medicaid Other | Admitting: Hospice and Palliative Medicine

## 2020-03-02 ENCOUNTER — Encounter: Payer: Self-pay | Admitting: Hospice and Palliative Medicine

## 2020-03-02 DIAGNOSIS — E039 Hypothyroidism, unspecified: Secondary | ICD-10-CM | POA: Diagnosis not present

## 2020-03-02 DIAGNOSIS — Z125 Encounter for screening for malignant neoplasm of prostate: Secondary | ICD-10-CM | POA: Diagnosis not present

## 2020-03-02 DIAGNOSIS — R300 Dysuria: Secondary | ICD-10-CM

## 2020-03-02 DIAGNOSIS — J449 Chronic obstructive pulmonary disease, unspecified: Secondary | ICD-10-CM

## 2020-03-02 DIAGNOSIS — F17210 Nicotine dependence, cigarettes, uncomplicated: Secondary | ICD-10-CM

## 2020-03-02 DIAGNOSIS — Z0001 Encounter for general adult medical examination with abnormal findings: Secondary | ICD-10-CM | POA: Diagnosis not present

## 2020-03-02 DIAGNOSIS — E041 Nontoxic single thyroid nodule: Secondary | ICD-10-CM

## 2020-03-02 DIAGNOSIS — R269 Unspecified abnormalities of gait and mobility: Secondary | ICD-10-CM

## 2020-03-02 DIAGNOSIS — Z23 Encounter for immunization: Secondary | ICD-10-CM

## 2020-03-02 DIAGNOSIS — E785 Hyperlipidemia, unspecified: Secondary | ICD-10-CM

## 2020-03-02 MED ORDER — AZITHROMYCIN 250 MG PO TABS: ORAL_TABLET | ORAL | 0 refills | Status: DC

## 2020-03-02 MED ORDER — IPRATROPIUM-ALBUTEROL 0.5-2.5 (3) MG/3ML IN SOLN: 3.0000 mL | Freq: Four times a day (QID) | RESPIRATORY_TRACT | 1 refills | Status: DC | PRN

## 2020-03-02 NOTE — Progress Notes (Signed)
[facility] [place], [place] [address]  Internal MEDICINE  Office Visit Note  Patient Name: Christopher Mason  [id number]  [id number]  Date of Service: 03/02/2020  Chief Complaint  Patient presents with  . Annual Exam  . Asthma  . COPD  . Hypertension  . Hyperlipidemia  . Hypothyroidism   HPI Pt is here for routine health maintenance examination He is here today with his caregiver Overall he says he has been doing good He is complaining today of a head cold-congestion, productive cough and increased shortness of breath Symptoms have been ongoing for about 5 days, not getting any better, has remained afebrile, has been using his nebulizer to help with symptom management but has not noticed any improvement Continues to smoke about a pack per day  Says he is sleeping good at night, healthy appetite, no issues with constipation, does frequently get up in the night to urinate, has difficulty initiating his stream and reports burning with urination  Requesting a letter to get a brace for his left leg to help with balance and mobility-explains that his insurance denied coverage for this, was seen by ortho and they recommended custom orthotic brace for treatment of left foot pain and left leg shortening due to osteoarthritis as well as poliomyelitis  Up to date on colonoscopy  Current Medication: Outpatient Encounter Medications as of 03/02/2020  Medication Sig  . albuterol (VENTOLIN HFA) 108 (90 Base) MCG/ACT inhaler Inhale 2 puffs into the lungs every 6 (six) hours as needed for wheezing or shortness of breath.  Marland Kitchen amitriptyline (ELAVIL) 50 MG tablet Take 1 tablet (50 mg total) by mouth at bedtime.  Marland Kitchen amLODipine (NORVASC) 10 MG tablet TAKE ONE TABLET BY MOUTH EVERY DAY  . cyclobenzaprine (FLEXERIL) 5 MG tablet 1/2-1 po bid prn  . docusate sodium (COLACE) 100 MG capsule TAKE ONE CAPSULE BY MOUTH EVERY DAY  . ipratropium-albuterol (DUONEB) 0.5-2.5 (3)  MG/3ML SOLN Take 3 mLs by nebulization every 6 (six) hours as needed.  Marland Kitchen levothyroxine (SYNTHROID) 150 MCG tablet Take 1 tablet (150 mcg total) by mouth daily.  Marland Kitchen lisinopril (ZESTRIL) 40 MG tablet TAKE ONE TABLET BY MOUTH EVERY DAY  . loperamide (IMODIUM A-D) 2 MG tablet Take 1 tablet (2 mg total) by mouth 4 (four) times daily as needed for diarrhea or loose stools.  . pantoprazole (PROTONIX) 40 MG tablet TAKE ONE TABLET BY MOUTH EVERY DAY  . pravastatin (PRAVACHOL) 10 MG tablet TAKE ONE TABLET BY MOUTH EVERY DAY  . propranolol (INDERAL) 10 MG tablet TAKE ONE TABLET BY MOUTH THREE TIMES A DAY  . solifenacin (VESICARE) 10 MG tablet TAKE ONE TABLET BY MOUTH EVERY DAY  . tamsulosin (FLOMAX) 0.4 MG CAPS capsule TAKE ONE CAPSULE BY MOUTH EVERY DAY  . traZODone (DESYREL) 100 MG tablet Take 100 mg by mouth at bedtime.  Marland Kitchen venlafaxine XR (EFFEXOR-XR) 75 MG 24 hr capsule Take 1 capsule (75 mg total) by mouth daily with breakfast.  . [DISCONTINUED] ipratropium-albuterol (DUONEB) 0.5-2.5 (3) MG/3ML SOLN Take 3 mLs by nebulization every 6 (six) hours as needed.  Marland Kitchen azithromycin (ZITHROMAX) 250 MG tablet Take one tablet by mouth once daily  . hydrochlorothiazide (HYDRODIURIL) 25 MG tablet Take by mouth. (Patient not taking: Reported on 03/02/2020)  . paliperidone (INVEGA) 9 MG 24 hr tablet Take 9 mg by mouth daily. (Patient not taking: Reported on 03/02/2020)  . predniSONE (DELTASONE) 10 MG tablet Use per dose pack (Patient not taking: Reported on 03/02/2020)  No facility-administered encounter medications on file as of 03/02/2020.    Surgical History: Past Surgical History:  Procedure Laterality Date  . BACK SURGERY    . CARPAL TUNNEL RELEASE Bilateral   . CHOLECYSTECTOMY    . COLONOSCOPY WITH PROPOFOL N/A 12/22/2019   Procedure: COLONOSCOPY WITH PROPOFOL;  Surgeon: Lin Landsman, MD;  Location[facility] ENDOSCOPY;  Service: Gastroenterology;  Laterality: N/A;  . ESOPHAGOGASTRODUODENOSCOPY (EGD) WITH  PROPOFOL N/A 09/06/2015   Procedure: ESOPHAGOGASTRODUODENOSCOPY (EGD) WITH PROPOFOL;  Surgeon: Hulen Luster, MD;  Location: [facility] ENDOSCOPY;  Service: Endoscopy;  Laterality: N/A;  . ROTATOR CUFF REPAIR     2007    Medical History: Past Medical History:  Diagnosis Date  . Acute kidney injury ([place]) 11/20/2014  . ARF (acute renal failure) ([place]) 11/06/2014  . Asthma   . Bipolar disorder ([place])   . COPD (chronic obstructive pulmonary disease) ([place])   . Depression   . Gastritis 02/07/2015  . GERD (gastroesophageal reflux disease)   . GI bleeding 09/05/2015  . History of colon polyps   . HLD (hyperlipidemia)   . Hypertension   . Hypokalemia 11/06/2014  . Hyponatremia 02/07/2015  . Hypotension 11/06/2014  . Hypothyroid   . Irritable bowel syndrome (IBS)   . Non compliance w medication regimen 09/16/2014  . Schizophrenia ([place])     Family History: Family History  Problem Relation Age of Onset  . Hypertension Father   . Cataracts Father   . Diabetes Sister   . Diabetes Brother   . Dementia Mother       Review of Systems  Constitutional: Negative for chills, diaphoresis, fatigue and unexpected weight change.  HENT: Positive for congestion, sinus pressure and sinus pain. Negative for postnasal drip, rhinorrhea, sneezing and sore throat.   Eyes: Negative for photophobia, redness and visual disturbance.  Respiratory: Positive for cough and shortness of breath. Negative for chest tightness.   Cardiovascular: Negative for chest pain, palpitations and leg swelling.  Gastrointestinal: Negative for abdominal pain, constipation, diarrhea, nausea and vomiting.  Genitourinary: Positive for dysuria and frequency.  Musculoskeletal: Positive for arthralgias and gait problem. Negative for back pain, joint swelling and neck pain.       Left foot pain and gait problems due to left leg length shorter compared to right  Skin: Negative for rash.  Neurological: Negative for dizziness, tremors, numbness and  headaches.  Hematological: Negative for adenopathy. Does not bruise/bleed easily.  Psychiatric/Behavioral: Negative for behavioral problems (Depression), sleep disturbance and suicidal ideas. The patient is not nervous/anxious.      Vital Signs: BP 140/80   Pulse 100   Temp 97.8 F (36.6 C)   Resp 16   Ht 5\' 7"  (1.702 m)   Wt 192 lb (87.1 kg)   SpO2 96%   BMI 30.07 kg/m    Physical Exam Vitals reviewed.  Constitutional:      Appearance: Normal appearance. He is obese.  HENT:     Right Ear: Tympanic membrane normal.     Left Ear: Tympanic membrane normal.     Mouth/Throat:     Mouth: Mucous membranes are dry.     Pharynx: Posterior oropharyngeal erythema present.  Neck:     Thyroid: Thyroid mass and thyromegaly present.     Comments: Thyromegaly with right sided palpable nodule, solid, non-tender Cardiovascular:     Rate and Rhythm: Normal rate and regular rhythm.     Pulses: Normal pulses.     Heart sounds: Normal heart sounds.  Pulmonary:  Effort: Tachypnea present.     Breath sounds: Examination of the right-upper field reveals wheezing. Examination of the left-upper field reveals wheezing. Examination of the right-middle field reveals wheezing. Examination of the left-middle field reveals wheezing. Examination of the right-lower field reveals decreased breath sounds. Examination of the left-lower field reveals decreased breath sounds. Decreased breath sounds and wheezing present.  Abdominal:     General: Abdomen is flat.     Palpations: Abdomen is soft.  Musculoskeletal:     Cervical back: Normal range of motion.     Comments: Abnormal gait due to left leg pain as well as difference in length compared to right, shuffling of left foot with ambulation--history of polio  Skin:    General: Skin is warm.  Neurological:     General: No focal deficit present.     Mental Status: He is alert and oriented to person, place, and time. Mental status is at baseline.   Psychiatric:        Mood and Affect: Mood normal.        Behavior: Behavior normal.        Thought Content: Thought content normal.    LABS: Recent Results (from the past 2160 hour(s))  SARS CORONAVIRUS 2 (TAT 6-24 HRS) Nasopharyngeal Nasopharyngeal Swab     Status: None   Collection Time: 12/18/19 11:08 AM   Specimen: Nasopharyngeal Swab  Result Value Ref Range   SARS Coronavirus 2 NEGATIVE NEGATIVE    Comment: (NOTE) SARS-CoV-2 target nucleic acids are NOT DETECTED.  The SARS-CoV-2 RNA is generally detectable in upper and lower respiratory specimens during the acute phase of infection. Negative results do not preclude SARS-CoV-2 infection, do not rule out co-infections with other pathogens, and should not be used as the sole basis for treatment or other patient management decisions. Negative results must be combined with clinical observations, patient history, and epidemiological information. The expected result is Negative.  Fact Sheet for Patients: SugarRoll.be  Fact Sheet for Healthcare Providers: https://www.woods-mathews.com/  This test is not yet approved or cleared by the [place] FDA and  has been authorized for detection and/or diagnosis of SARS-CoV-2 by FDA under an Emergency Use Authorization (EUA). This EUA will remain  in effect (meaning this test can be used) for the duration of the COVID-19 declaration under Se ction 564(b)(1) of the Act, 21 U.S.C. section 360bbb-3(b)(1), unless the authorization is terminated or revoked sooner.  Performed at [facility], [place] [address] [place], [place] [address]   Surgical pathology     Status: None   Collection Time: 12/22/19 11:57 AM  Result Value Ref Range   SURGICAL PATHOLOGY      SURGICAL PATHOLOGY CASE: [id number] PATIENT: [name] Surgical Pathology Report     Specimen Submitted: A. Colon polyp x2, cecum; cold snare B. Colon polyp x3,  ascending; cold (2) and hot (1) snare C. Colon polyp x6, transverse; hot snare  Clinical History: Screening colonoscopy     DIAGNOSIS: A.  COLON POLYP X 2, CECUM; COLD SNARE: - TUBULAR ADENOMAS, 5 FRAGMENTS. - NEGATIVE FOR HIGH-GRADE DYSPLASIA AND MALIGNANCY.  B.  COLON POLYP X 3, ASCENDING; COLD SNARE (2) AND HOT SNARE (1): - TUBULAR ADENOMA, LARGEST PIECE WITH THERMAL ARTIFACT. - TUBULAR ADENOMAS, MULTIPLE SMALLER FRAGMENTS. - NEGATIVE FOR HIGH-GRADE DYSPLASIA AND MALIGNANCY.  C.  COLON POLYP X 6, TRANSVERSE; HOT SNARE: - TUBULAR ADENOMAS, AT LEAST 12 FRAGMENTS. - NEGATIVE FOR HIGH-GRADE DYSPLASIA AND MALIGNANCY.  Comment: Current multidisciplinary guidelines recommend consideration of  genetic testing for patients with 10 or more adenomatous polyps. References: 1. Rick Duff, et al. A practice guideline from the [place] of Genetic Counselors: referral indications for cancer predisposition assessment. Genet Med 2015;17:70-87. 2. Syngal S, Brand RE, Church JM, et al. [facility] clinical guideline: Genetic testing and management of hereditary gastrointestinal cancer syndromes. Am Nicki [place] 2015;110:223-262, quiz 50. 3. Stoffel EM, Mangu PB, Antonietta Jewel, et al. Hereditary colorectal cancer syndromes: American Society of Clinical Oncology Clinical Practice Guideline endorsement of the familial risk colorectal cancer: European Society for Medical Oncology Clinical Practice Guidelines. J Clin Oncol 2015;33:209-217.  GROSS DESCRIPTION: A. Labeled: Cold snare polyp x2 cecum Received: Formalin Tissue fragment(s): Multiple Size: Aggregate, 1.2 x 0.6 x 0.2 cm Description: Tan soft tissue fragments Entirely submitted in 1 cassette.  B. Labeled: Cold snare polypectomy/hot s nare polyp x1 ascending colon Received: Formalin Tissue fragment(s): Multiple Size: Aggregate, 2.1 x 0.8 x 0.2  cm Description: Received are fragments of tan-pink soft tissue admixed with fecal matter.  The ratio of soft tissue to fecal matter is 70: 30. Entirely submitted in 1 cassette.  C. Labeled: Hot snare polyp x6 transverse colon Received: Formalin Tissue fragment(s): Multiple Size: Aggregate, 2.6 x 1.5 x 0.4 cm Description: Received are fragments of tan-pink soft tissue admixed with fecal matter.  The ratio of soft tissue and fecal matter is 50: 50. Entirely submitted in 1 cassette.   Final Diagnosis performed by Bryan Lemma, MD.   Electronically signed 12/23/2019 7:33:33PM The electronic signature indicates that the named Attending Pathologist has evaluated the specimen Technical component performed at [facility], [address] [place], [place] [address] Lab: [phone] Dir: Rush Farmer, MD, MMM  Professional component performed at [facility], [place], [place], [place], [place] [address] Lab: [phone] Dir: Dellia Nims. Reuel Derby, MD     Assessment/Plan: 1. Encounter for routine adult health examination with abnormal findings Well appearing 59 year old male, up to date on PHM Will review annual labs and adjust plan of care as indicated - CBC w/Diff/Platelet - Comprehensive Metabolic Panel (CMET) - Lipid Panel With LDL/HDL Ratio - TSH + free T4 - PSA  2. Acquired hypothyroidism Will review updated thyroid panel and adjust synthroid dose as indicated - TSH + free T4  3. Encounter for prostate cancer screening - PSA  4. Chronic obstructive pulmonary disease, unspecified COPD type ([place]) Will treat acute exacerbation with 10 day course of azithromycin Continue with nebulizer treatments, will get updated CXR May need f/u with pulmonology - ipratropium-albuterol (DUONEB) 0.5-2.5 (3) MG/3ML SOLN; Take 3 mLs by nebulization every 6 (six) hours as needed.  Dispense: 360 mL; Refill: 1 - azithromycin (ZITHROMAX) 250 MG tablet; Take one tablet by mouth once  daily  Dispense: 10 tablet; Refill: 0 - DG Chest 2 View; Future  5. Thyroid nodule Palpated nodule on right side of thyroid, will obtain US for review and adjust plan of care as indicated - US THYROID; Future  6. Gait abnormality Seen by orthopaedics, will need to look further into getting him a brace for his left foot/leg, diagnosed with osteoarthritis of left foot, pes planus/left, poliomyelitis history, left leg length inequality High fall risk  7. Cigarette nicotine dependence without complication Currently smoking a pack per day--goal for next visit in 2 week is for him to smoke 3 less cigarettes per day Smoking cessation counseling: 1. Pt acknowledges the risks of long  term smoking, she will try to quite smoking. 2. Options for different medications including nicotine products, chewing gum, patch etc, Wellbutrin and Chantix is discussed 3. Goal and date of compete cessation is discussed 4. Total time spent in smoking cessation is 15 min.   8. Hyperlipidemia, unspecified hyperlipidemia type Will review updated lipid panel and adjust plan of care as indicated, currently on pravastatin - Lipid Panel With LDL/HDL Ratio  9. Flu vaccine need - Flu Vaccine MDCK QUAD PF  10. Dysuria - UA/M w/rflx Culture, Routine  General Counseling: Harshaan verbalizes understanding of the findings of todays visit and agrees with plan of treatment. I have discussed any further diagnostic evaluation that may be needed or ordered today. We also reviewed his medications today. he has been encouraged to call the office with any questions or concerns that should arise related to todays visit.    Counseling:    Orders Placed This Encounter  Procedures  . US THYROID  . DG Chest 2 View  . Flu Vaccine MDCK QUAD PF  . CBC w/Diff/Platelet  . Comprehensive Metabolic Panel (CMET)  . Lipid Panel With LDL/HDL Ratio  . TSH + free T4  . PSA  . UA/M w/rflx Culture, Routine    Meds ordered this encounter   Medications  . ipratropium-albuterol (DUONEB) 0.5-2.5 (3) MG/3ML SOLN    Sig: Take 3 mLs by nebulization every 6 (six) hours as needed.    Dispense:  360 mL    Refill:  1  . azithromycin (ZITHROMAX) 250 MG tablet    Sig: Take one tablet by mouth once daily    Dispense:  10 tablet    Refill:  0    Total time spent: 30 Minutes  Time spent includes review of chart, medications, test results, and follow up plan with the patient.   This patient was seen by Casey Burkitt AGNP-C Collaboration with Dr Lavera Guise as a part of collaborative care agreement   Tanna Furry. [facility] Internal Medicine

## 2020-03-03 LAB — UA/M W/RFLX CULTURE, ROUTINE
Bilirubin, UA: NEGATIVE
Glucose, UA: NEGATIVE
Ketones, UA: NEGATIVE
Leukocytes,UA: NEGATIVE
Nitrite, UA: NEGATIVE
Protein,UA: NEGATIVE
RBC, UA: NEGATIVE
Specific Gravity, UA: 1.013 (ref 1.005–1.030)
Urobilinogen, Ur: 0.2 mg/dL (ref 0.2–1.0)
pH, UA: 6.5 (ref 5.0–7.5)

## 2020-03-03 LAB — MICROSCOPIC EXAMINATION
Bacteria, UA: NONE SEEN
Casts: NONE SEEN /lpf
Epithelial Cells (non renal): NONE SEEN /hpf (ref 0–10)
RBC, Urine: NONE SEEN /hpf (ref 0–2)
WBC, UA: NONE SEEN /hpf (ref 0–5)

## 2020-03-04 ENCOUNTER — Ambulatory Visit
Admission: RE | Admit: 2020-03-04 | Discharge: 2020-03-04 | Disposition: A | Discharge status: Home / Self Care | Payer: Medicaid Other | Source: Ambulatory Visit | Attending: Hospice and Palliative Medicine | Admitting: Hospice and Palliative Medicine

## 2020-03-04 ENCOUNTER — Other Ambulatory Visit: Payer: Self-pay

## 2020-03-04 ENCOUNTER — Telehealth: Payer: Self-pay

## 2020-03-04 ENCOUNTER — Ambulatory Visit
Admission: RE | Admit: 2020-03-04 | Discharge: 2020-03-04 | Disposition: A | Discharge status: Home / Self Care | Payer: Medicaid Other | Attending: Hospice and Palliative Medicine | Admitting: Hospice and Palliative Medicine

## 2020-03-04 DIAGNOSIS — J449 Chronic obstructive pulmonary disease, unspecified: Secondary | ICD-10-CM | POA: Insufficient documentation

## 2020-03-04 NOTE — Telephone Encounter (Signed)
Faxed [facility] for [facility]otic leg brace

## 2020-03-05 LAB — TSH+FREE T4
Free T4: 1.66 ng/dL (ref 0.82–1.77)
TSH: 2.85 u[IU]/mL (ref 0.450–4.500)

## 2020-03-05 LAB — CBC WITH DIFFERENTIAL/PLATELET
Basophils Absolute: 0.1 10*3/uL (ref 0.0–0.2)
Basos: 1 %
EOS (ABSOLUTE): 0.3 10*3/uL (ref 0.0–0.4)
Eos: 3 %
Hematocrit: 44.3 % (ref 37.5–51.0)
Hemoglobin: 14.9 g/dL (ref 13.0–17.7)
Immature Grans (Abs): 0 10*3/uL (ref 0.0–0.1)
Immature Granulocytes: 0 %
Lymphocytes Absolute: 1.6 10*3/uL (ref 0.7–3.1)
Lymphs: 19 %
MCH: 30.5 pg (ref 26.6–33.0)
MCHC: 33.6 g/dL (ref 31.5–35.7)
MCV: 91 fL (ref 79–97)
Monocytes Absolute: 0.8 10*3/uL (ref 0.1–0.9)
Monocytes: 9 %
Neutrophils Absolute: 6 10*3/uL (ref 1.4–7.0)
Neutrophils: 68 %
Platelets: 237 10*3/uL (ref 150–450)
RBC: 4.88 x10E6/uL (ref 4.14–5.80)
RDW: 13.8 % (ref 11.6–15.4)
WBC: 8.8 10*3/uL (ref 3.4–10.8)

## 2020-03-05 LAB — COMPREHENSIVE METABOLIC PANEL
ALT: 20 IU/L (ref 0–44)
AST: 13 IU/L (ref 0–40)
Albumin/Globulin Ratio: 1.6 (ref 1.2–2.2)
Albumin: 4.1 g/dL (ref 3.8–4.9)
Alkaline Phosphatase: 111 IU/L (ref 44–121)
BUN/Creatinine Ratio: 11 (ref 10–24)
BUN: 11 mg/dL (ref 8–27)
Bilirubin Total: 0.3 mg/dL (ref 0.0–1.2)
CO2: 23 mmol/L (ref 20–29)
Calcium: 9.3 mg/dL (ref 8.6–10.2)
Chloride: 101 mmol/L (ref 96–106)
Creatinine, Ser: 0.97 mg/dL (ref 0.76–1.27)
GFR calc Af Amer: 98 mL/min/{1.73_m2} (ref >59)
GFR calc non Af Amer: 84 mL/min/{1.73_m2} (ref >59)
Globulin, Total: 2.6 g/dL (ref 1.5–4.5)
Glucose: 72 mg/dL (ref 65–99)
Potassium: 4.3 mmol/L (ref 3.5–5.2)
Sodium: 136 mmol/L (ref 134–144)
Total Protein: 6.7 g/dL (ref 6.0–8.5)

## 2020-03-05 LAB — LIPID PANEL WITH LDL/HDL RATIO
Cholesterol, Total: 187 mg/dL (ref 100–199)
HDL: 42 mg/dL (ref >39)
LDL Chol Calc (NIH): 117 mg/dL — ABNORMAL HIGH (ref 0–99)
LDL/HDL Ratio: 2.8 ratio (ref 0.0–3.6)
Triglycerides: 155 mg/dL — ABNORMAL HIGH (ref 0–149)
VLDL Cholesterol Cal: 28 mg/dL (ref 5–40)

## 2020-03-05 LAB — PSA: Prostate Specific Ag, Serum: 0.8 ng/mL (ref 0.0–4.0)

## 2020-03-05 NOTE — Progress Notes (Signed)
Labs reviewed, continue with statin therapy, will discuss further at next follow-up appt.

## 2020-03-15 ENCOUNTER — Ambulatory Visit: Payer: Medicaid Other | Admitting: Hospice and Palliative Medicine

## 2020-03-22 ENCOUNTER — Encounter: Payer: Self-pay | Admitting: Hospice and Palliative Medicine

## 2020-03-22 ENCOUNTER — Emergency Department
Admission: EM | Admit: 2020-03-22 | Discharge: 2020-03-22 | Disposition: A | Discharge status: Home / Self Care | Payer: Medicaid Other

## 2020-03-22 ENCOUNTER — Ambulatory Visit (INDEPENDENT_AMBULATORY_CARE_PROVIDER_SITE_OTHER): Payer: Medicaid Other | Admitting: Hospice and Palliative Medicine

## 2020-03-22 ENCOUNTER — Other Ambulatory Visit: Payer: Self-pay

## 2020-03-22 DIAGNOSIS — R079 Chest pain, unspecified: Secondary | ICD-10-CM | POA: Diagnosis not present

## 2020-03-22 DIAGNOSIS — R9431 Abnormal electrocardiogram [ECG] [EKG]: Secondary | ICD-10-CM
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
## 2020-03-22 NOTE — ED Triage Notes (Signed)
PT seen getting up from the triage waiting area stating to his visitor "I hate this stupid hospital, I just want to go home."  Pt walked out of door and into parking lot with visitor behind him.

## 2020-03-25 ENCOUNTER — Telehealth: Payer: Self-pay

## 2020-03-25 ENCOUNTER — Encounter: Payer: Self-pay | Admitting: Hospice and Palliative Medicine

## 2020-03-25 NOTE — Telephone Encounter (Signed)
Pt was seen at [facility] and sent to ER where he left without being seen. I tried to call today to get him back on Taylor's schedule and he forwarded my call to VM and I was unable to [facility].

## 2020-04-22 IMAGING — CR ABDOMEN - 2 VIEW
1 series · 3 of 3 positions shown · non-contrast
Comparison: 05/16/2017

CLINICAL DATA: Abdominal pain

EXAM:
ABDOMEN - 2 VIEW

[Series 1: dg abd 2 views · 0.14mm/px · 3 of 3 slices shown]
[im 1/3]
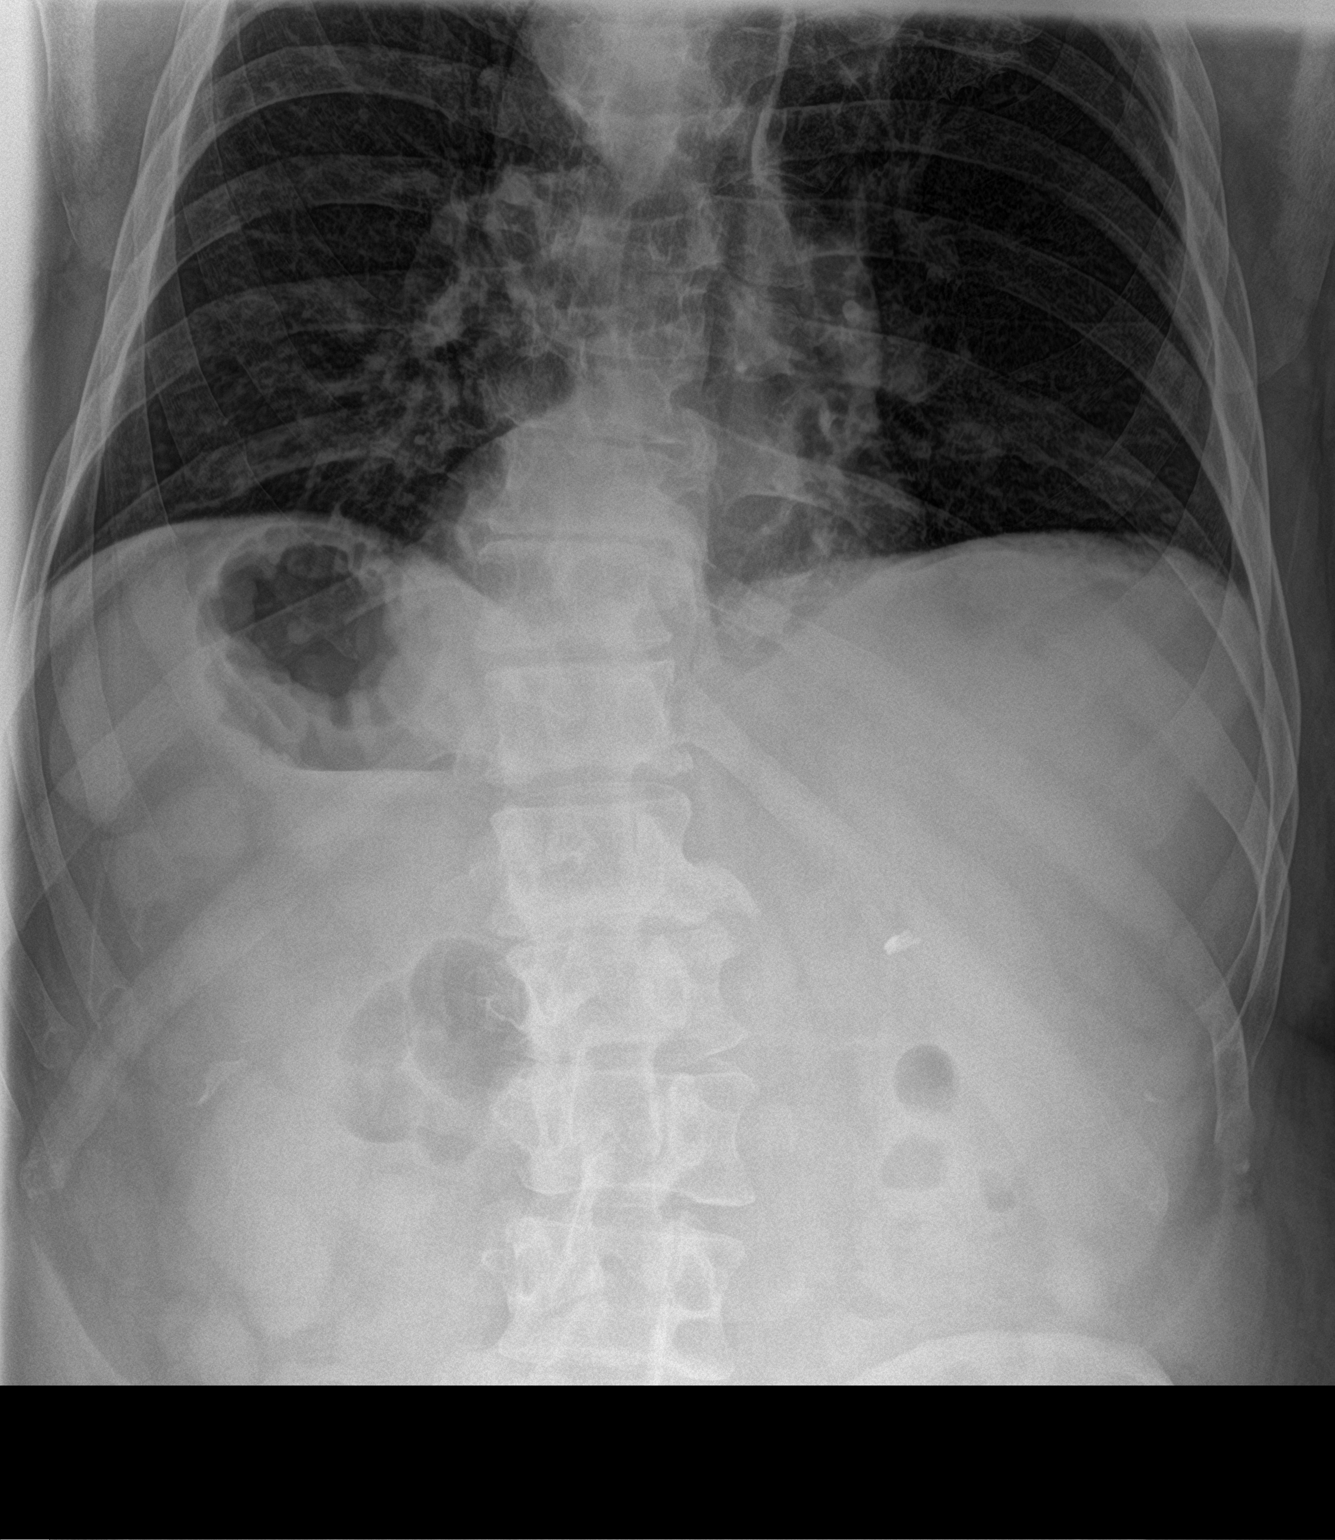
[im 2/3]
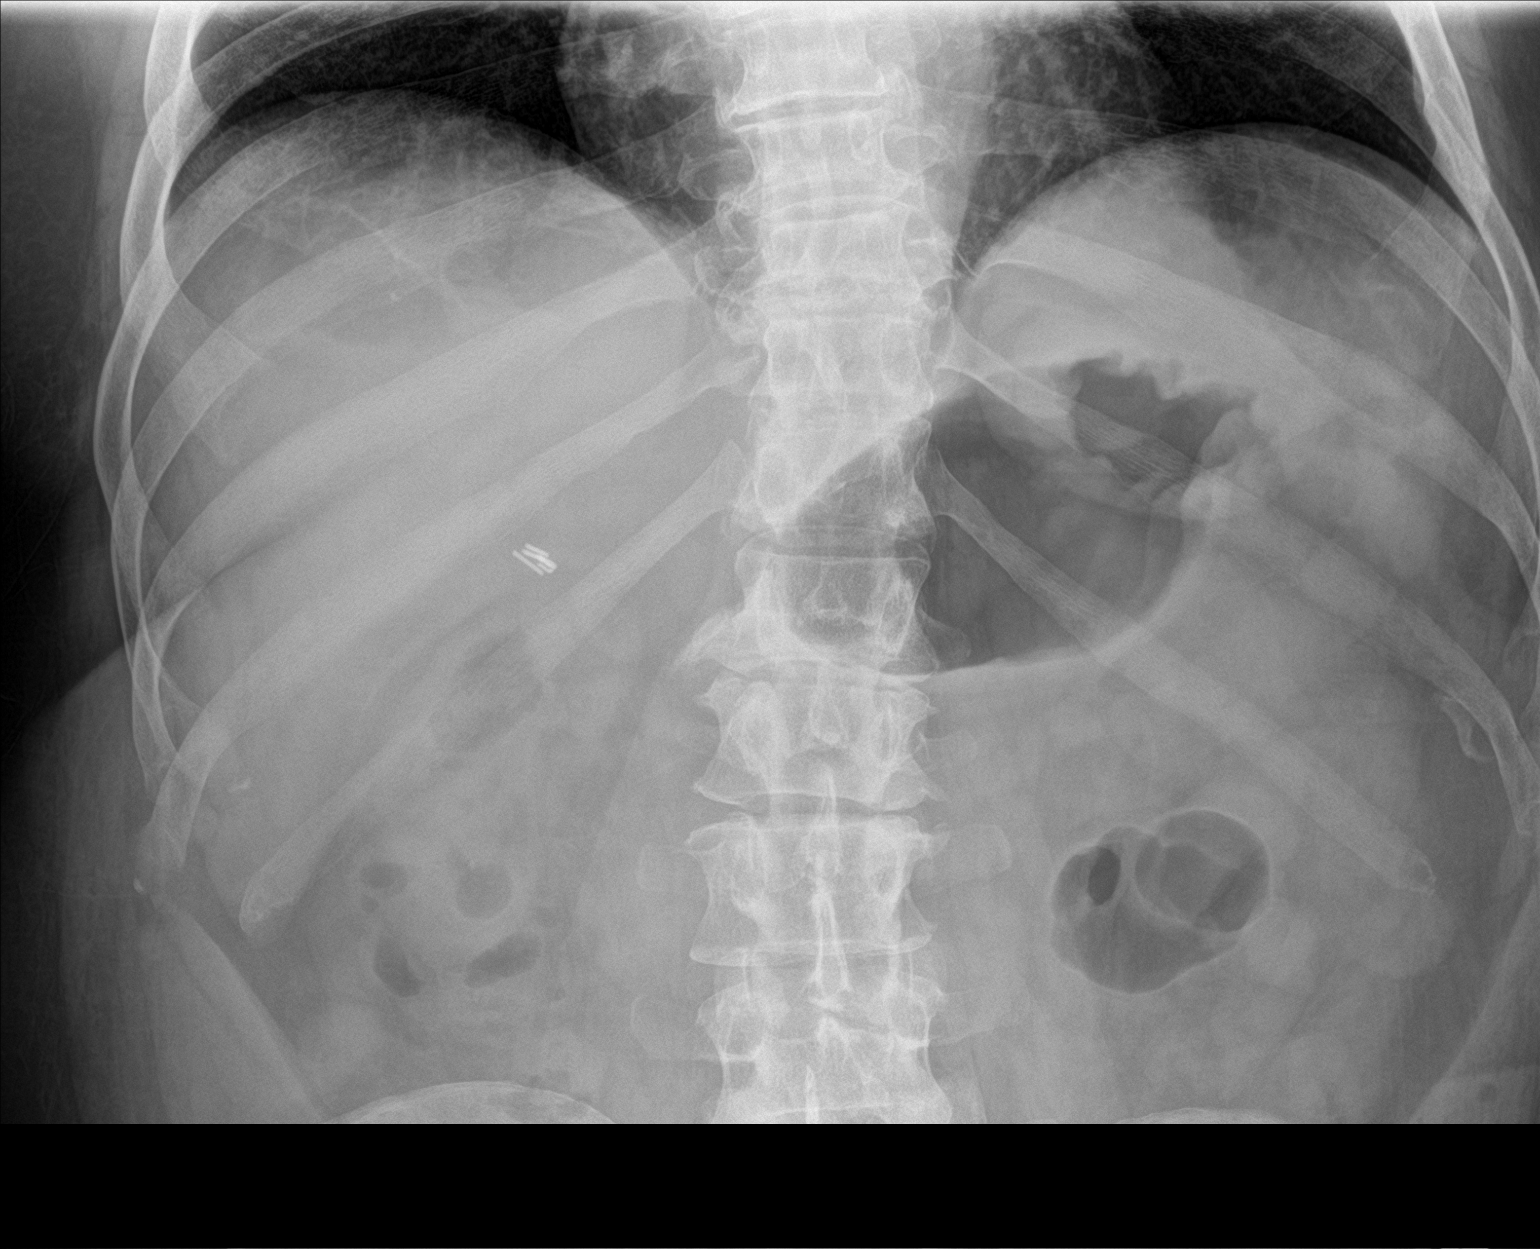
[im 3/3]
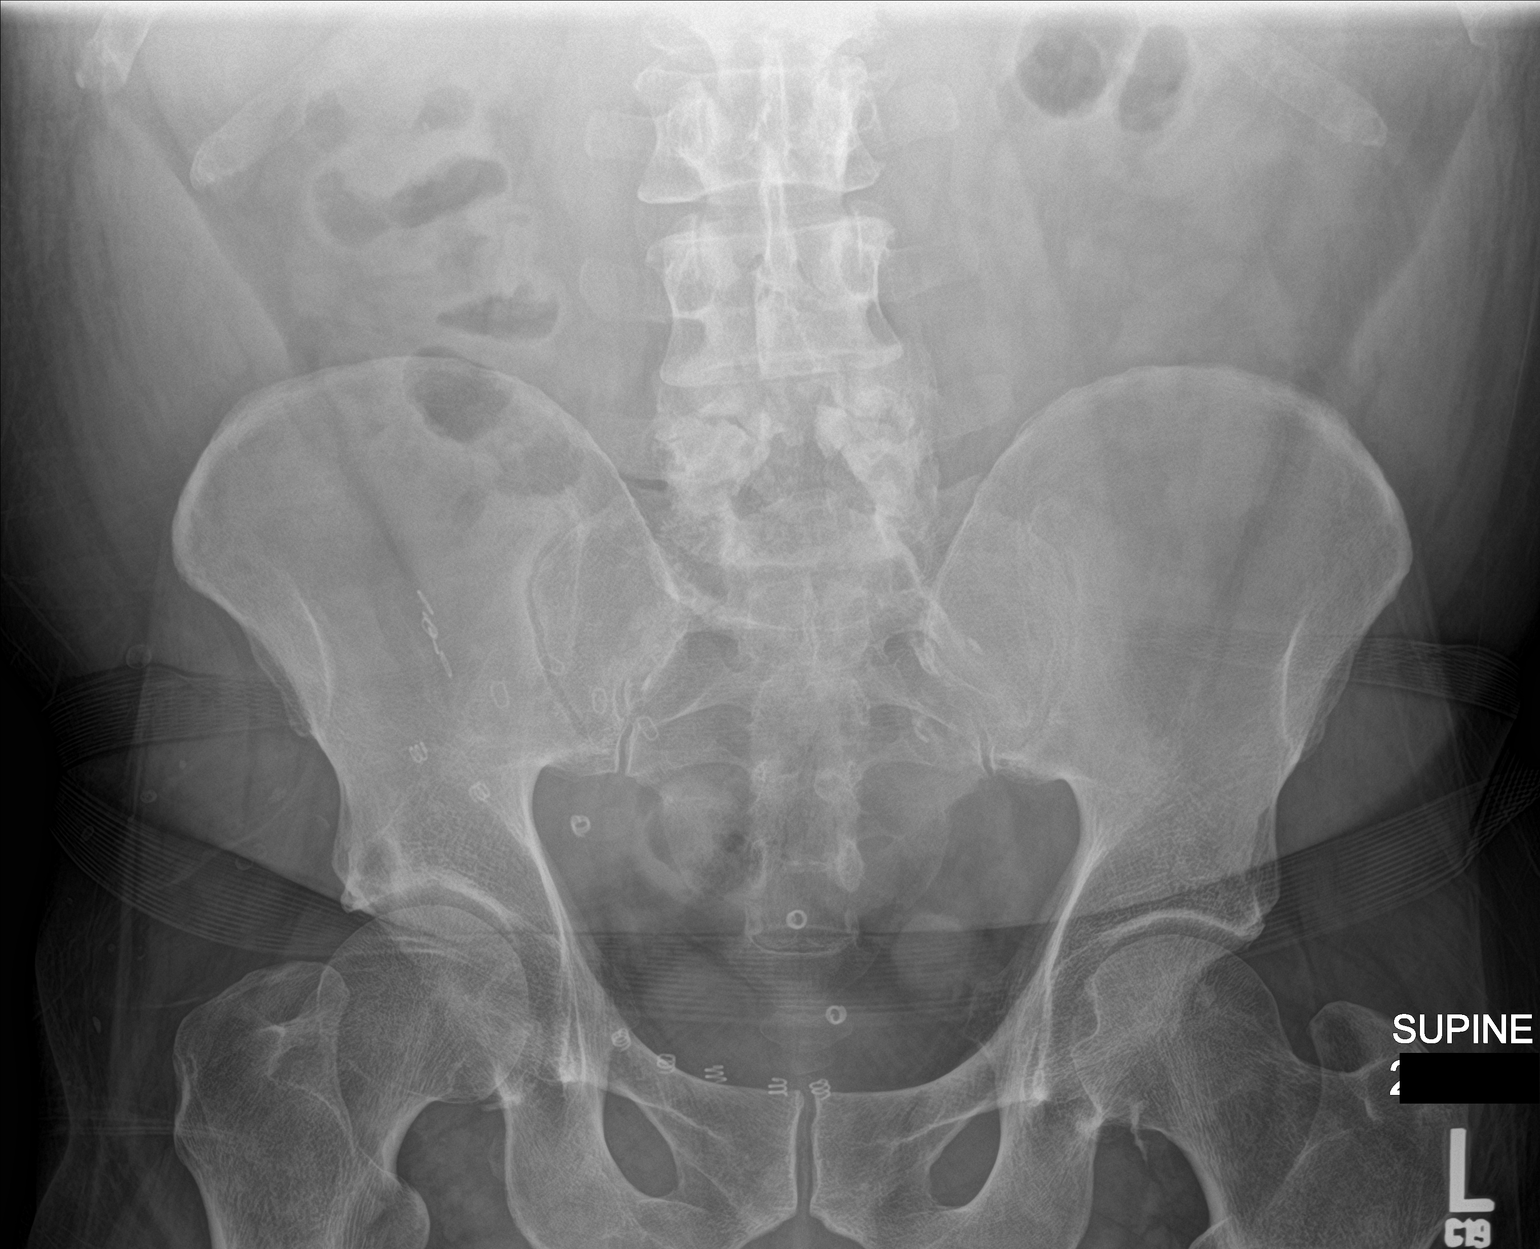

[3 of 3 positions shown; findings below may reference images not displayed]

FINDINGS: Nonobstructive bowel gas pattern.

No evidence of free air under the diaphragm on the upright view.

Mesh repair over the right inguinal region.

Cholecystectomy clips.

Degenerative changes of the visualized thoracolumbar spine.
IMPRESSION: No evidence of small bowel obstruction or free air.

Mesh repair of the right inguinal region.

## 2020-04-22 IMAGING — CR LEFT KNEE - COMPLETE 4+ VIEW
1 series · 4 of 4 positions shown · non-contrast
Comparison: 05/31/2016

CLINICAL DATA: Left knee pain x1 week

EXAM:
LEFT KNEE - COMPLETE 4+ VIEW

[Series 1: dg knee complete 4 views left · 0.14mm/px · 4 of 4 slices shown]
[im 1/4]
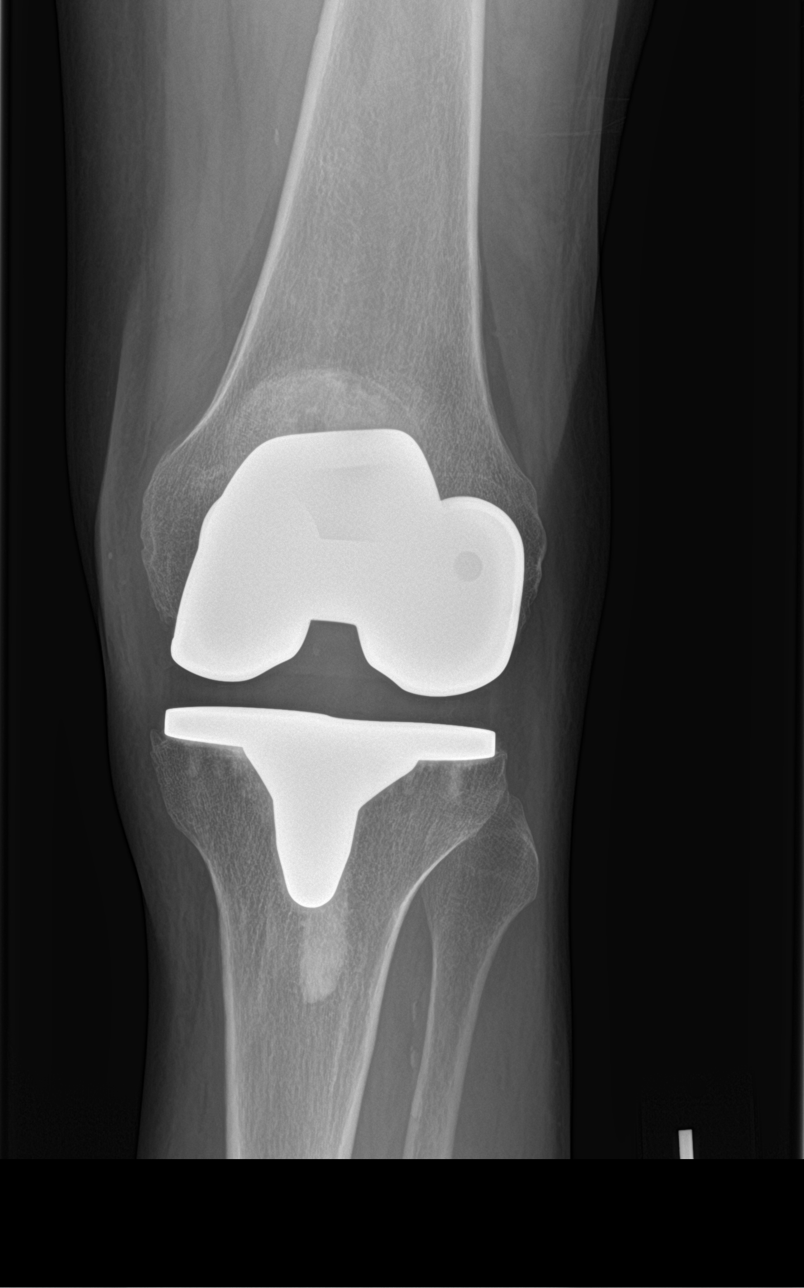
[im 2/4]
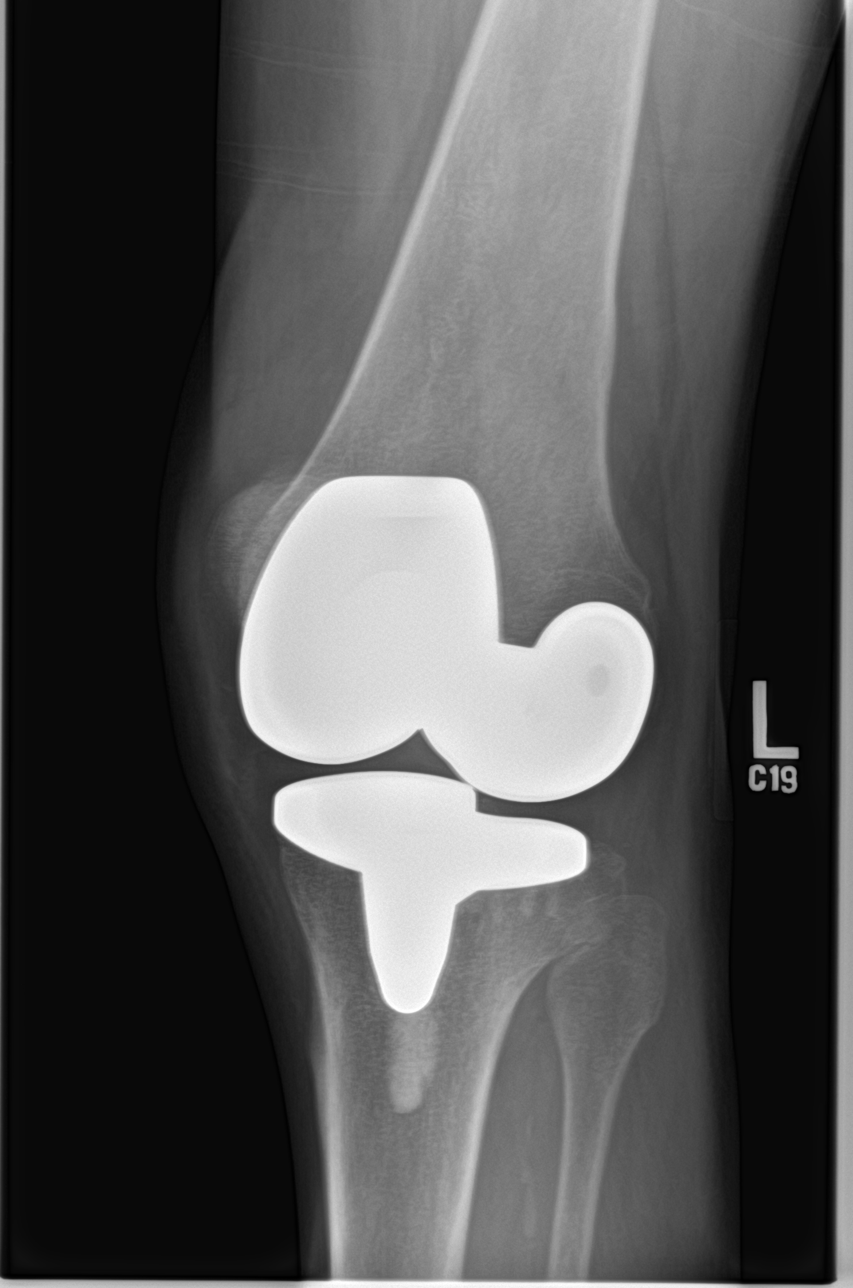
[im 3/4]
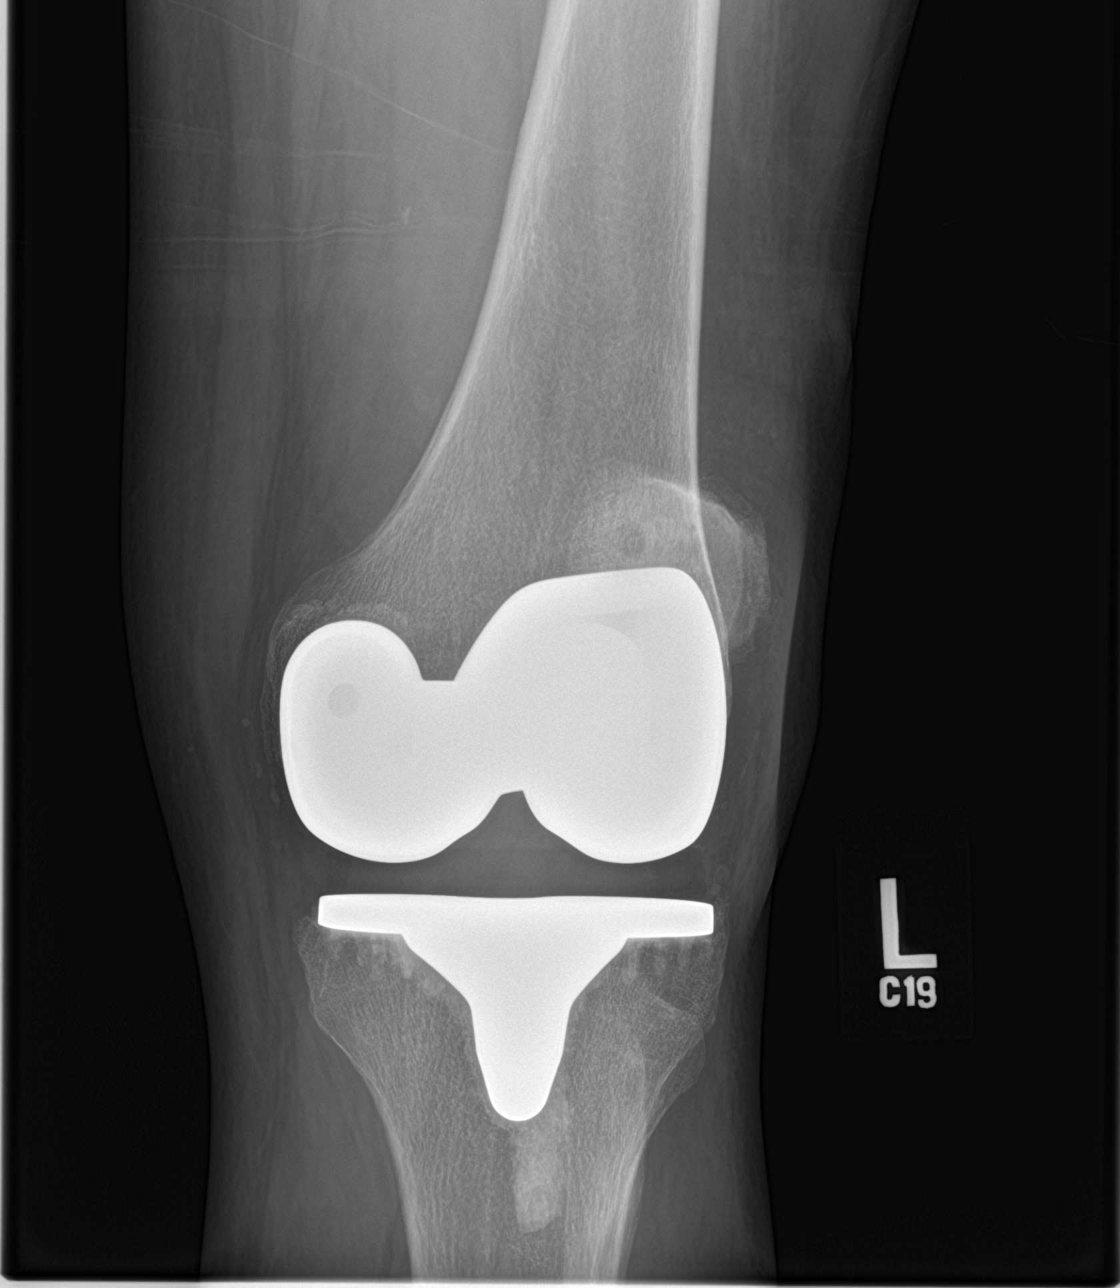
[im 4/4]
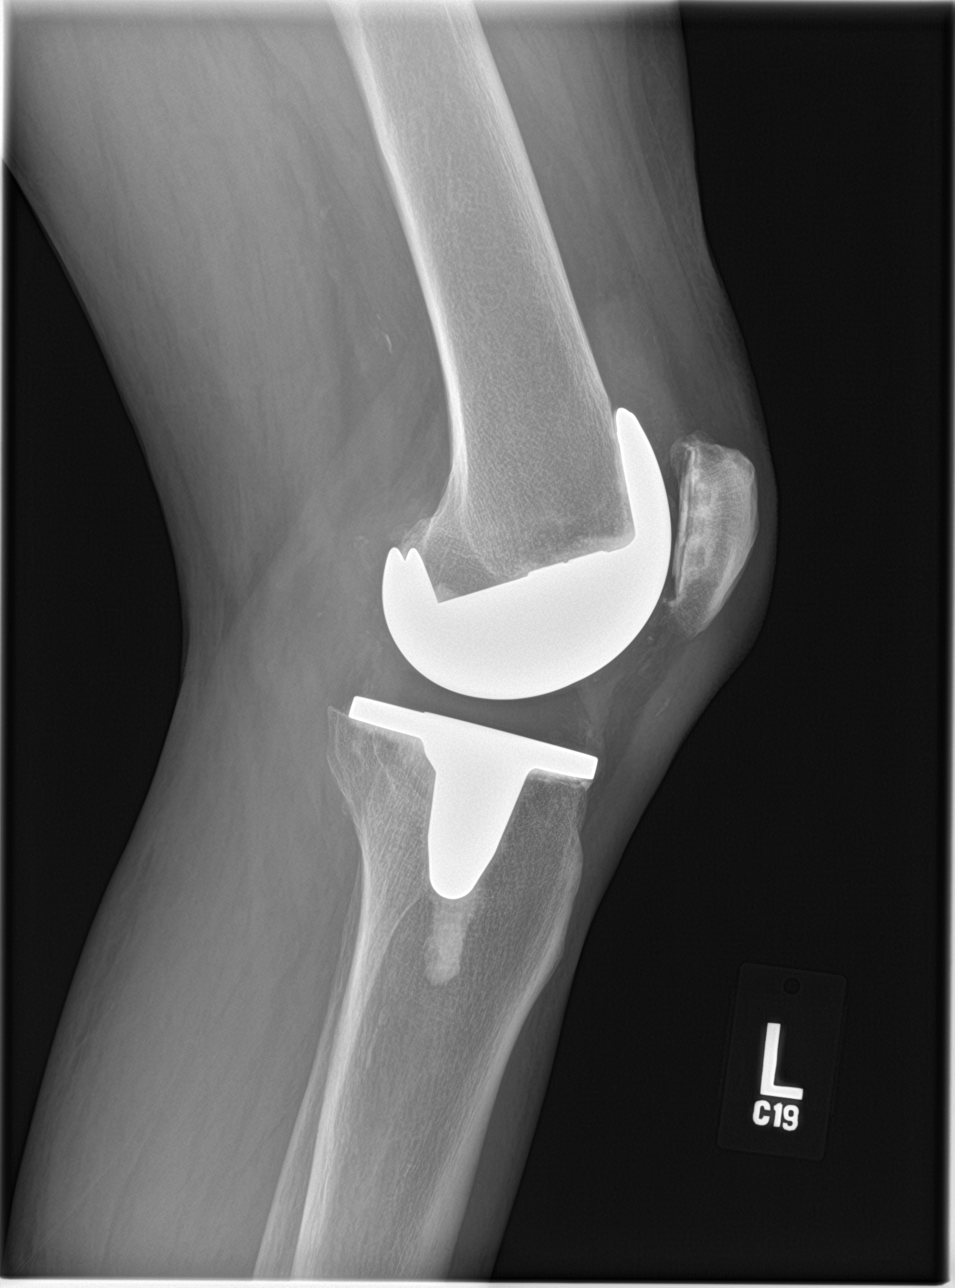

[4 of 4 positions shown; findings below may reference images not displayed]

FINDINGS: Left knee arthroplasty, without evidence of complication.

Mild suprapatellar knee joint effusion.

The visualized soft tissues are unremarkable.
IMPRESSION: Left knee arthroplasty, without evidence of complication.

Mild suprapatellar knee joint effusion.

## 2020-04-25 ENCOUNTER — Other Ambulatory Visit: Payer: Self-pay | Admitting: Urology

## 2020-04-25 ENCOUNTER — Other Ambulatory Visit: Payer: Self-pay | Admitting: Adult Health

## 2020-04-25 DIAGNOSIS — E785 Hyperlipidemia, unspecified: Secondary | ICD-10-CM

## 2020-04-25 DIAGNOSIS — E039 Hypothyroidism, unspecified: Secondary | ICD-10-CM

## 2020-04-25 DIAGNOSIS — I10 Essential (primary) hypertension: Secondary | ICD-10-CM

## 2020-04-26 ENCOUNTER — Other Ambulatory Visit: Payer: Self-pay | Admitting: Adult Health

## 2020-04-26 DIAGNOSIS — E039 Hypothyroidism, unspecified: Secondary | ICD-10-CM

## 2020-04-28 ENCOUNTER — Other Ambulatory Visit: Payer: Self-pay

## 2020-04-28 DIAGNOSIS — E039 Hypothyroidism, unspecified: Secondary | ICD-10-CM

## 2020-04-28 MED ORDER — LEVOTHYROXINE SODIUM 150 MCG PO TABS: 150.0000 ug | ORAL_TABLET | Freq: Every day | ORAL | 3 refills | Status: DC

## 2020-05-18 ENCOUNTER — Other Ambulatory Visit: Payer: Self-pay | Admitting: Adult Health

## 2020-05-18 DIAGNOSIS — E785 Hyperlipidemia, unspecified: Secondary | ICD-10-CM

## 2020-05-18 DIAGNOSIS — K5900 Constipation, unspecified: Secondary | ICD-10-CM

## 2020-05-18 DIAGNOSIS — I10 Essential (primary) hypertension: Secondary | ICD-10-CM

## 2020-05-21 ENCOUNTER — Other Ambulatory Visit: Payer: Self-pay | Admitting: Internal Medicine

## 2020-05-21 DIAGNOSIS — E785 Hyperlipidemia, unspecified: Secondary | ICD-10-CM

## 2020-05-21 DIAGNOSIS — I10 Essential (primary) hypertension: Secondary | ICD-10-CM

## 2020-05-25 ENCOUNTER — Telehealth: Payer: Self-pay

## 2020-05-25 NOTE — Telephone Encounter (Signed)
Patient has been advised to call his ortho regarding his knee pain. Christopher Mason

## 2020-06-15 ENCOUNTER — Other Ambulatory Visit: Payer: Self-pay | Admitting: Adult Health

## 2020-06-15 DIAGNOSIS — K5900 Constipation, unspecified: Secondary | ICD-10-CM

## 2020-06-21 ENCOUNTER — Telehealth: Payer: Self-pay

## 2020-06-21 ENCOUNTER — Other Ambulatory Visit: Payer: Self-pay | Admitting: Hospice and Palliative Medicine

## 2020-06-21 DIAGNOSIS — K5900 Constipation, unspecified: Secondary | ICD-10-CM

## 2020-06-21 NOTE — Telephone Encounter (Signed)
Returned pt call, LMOM to make appt with TH

## 2020-06-25 ENCOUNTER — Encounter: Payer: Self-pay | Admitting: Hospice and Palliative Medicine

## 2020-06-25 ENCOUNTER — Ambulatory Visit (INDEPENDENT_AMBULATORY_CARE_PROVIDER_SITE_OTHER): Payer: Medicaid Other | Admitting: Hospice and Palliative Medicine

## 2020-06-25 VITALS — BP 122/86 | HR 75 | Temp 97.5°F | Resp 16 | Ht 67.0 in | Wt 193.2 lb

## 2020-06-25 DIAGNOSIS — R269 Unspecified abnormalities of gait and mobility: Secondary | ICD-10-CM | POA: Diagnosis not present

## 2020-06-25 DIAGNOSIS — R0600 Dyspnea, unspecified: Secondary | ICD-10-CM

## 2020-06-25 DIAGNOSIS — S81801A Unspecified open wound, right lower leg, initial encounter: Secondary | ICD-10-CM

## 2020-06-25 DIAGNOSIS — R0609 Other forms of dyspnea: Secondary | ICD-10-CM

## 2020-06-25 DIAGNOSIS — Z8612 Personal history of poliomyelitis: Secondary | ICD-10-CM | POA: Diagnosis not present

## 2020-06-25 MED ORDER — DOXYCYCLINE HYCLATE 100 MG PO TABS: 100.0000 mg | ORAL_TABLET | Freq: Two times a day (BID) | ORAL | 0 refills | Status: DC
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
## 2020-06-28 ENCOUNTER — Encounter: Payer: Self-pay | Admitting: Hospice and Palliative Medicine

## 2020-07-01 ENCOUNTER — Ambulatory Visit: Payer: Medicaid Other | Admitting: Surgery

## 2020-07-06 ENCOUNTER — Encounter: Payer: Self-pay | Admitting: Surgery

## 2020-07-06 ENCOUNTER — Other Ambulatory Visit: Payer: Self-pay

## 2020-07-06 ENCOUNTER — Ambulatory Visit: Payer: Medicaid Other | Admitting: Surgery

## 2020-07-06 VITALS — BP 108/76 | HR 72 | Temp 98.2°F | Ht 67.0 in | Wt 190.2 lb

## 2020-07-06 DIAGNOSIS — L905 Scar conditions and fibrosis of skin: Secondary | ICD-10-CM

## 2020-07-06 NOTE — Patient Instructions (Signed)
We have sent a referral to [place] plastic surgeon. Someone from their office will call to schedule you an appointment. Please call our office if you do not hear from anyone within 5-7 days.
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
## 2020-07-13 ENCOUNTER — Telehealth: Payer: Self-pay

## 2020-07-13 NOTE — Telephone Encounter (Signed)
lmom to confirm 07-14-20 ultrasound

## 2020-07-14 ENCOUNTER — Ambulatory Visit: Payer: Medicaid Other

## 2020-07-14 ENCOUNTER — Other Ambulatory Visit: Payer: Self-pay

## 2020-07-14 DIAGNOSIS — R0609 Other forms of dyspnea: Secondary | ICD-10-CM

## 2020-07-14 DIAGNOSIS — R0600 Dyspnea, unspecified: Secondary | ICD-10-CM

## 2020-07-14 DIAGNOSIS — R0602 Shortness of breath: Secondary | ICD-10-CM | POA: Diagnosis not present

## 2020-07-21 ENCOUNTER — Ambulatory Visit: Payer: Medicaid Other | Admitting: Hospice and Palliative Medicine

## 2020-07-21 ENCOUNTER — Other Ambulatory Visit: Payer: Self-pay

## 2020-07-21 ENCOUNTER — Encounter: Payer: Self-pay | Admitting: Hospice and Palliative Medicine

## 2020-07-21 VITALS — BP 100/72 | HR 75 | Temp 97.3°F | Resp 16 | Ht 67.0 in | Wt 191.0 lb

## 2020-07-21 DIAGNOSIS — Z8612 Personal history of poliomyelitis: Secondary | ICD-10-CM

## 2020-07-21 DIAGNOSIS — Z122 Encounter for screening for malignant neoplasm of respiratory organs: Secondary | ICD-10-CM

## 2020-07-21 DIAGNOSIS — F17210 Nicotine dependence, cigarettes, uncomplicated: Secondary | ICD-10-CM

## 2020-07-21 DIAGNOSIS — M5442 Lumbago with sciatica, left side: Secondary | ICD-10-CM

## 2020-07-21 DIAGNOSIS — M5441 Lumbago with sciatica, right side: Secondary | ICD-10-CM

## 2020-07-21 DIAGNOSIS — R269 Unspecified abnormalities of gait and mobility: Secondary | ICD-10-CM

## 2020-07-21 DIAGNOSIS — R059 Cough, unspecified: Secondary | ICD-10-CM

## 2020-07-21 DIAGNOSIS — G8929 Other chronic pain: Secondary | ICD-10-CM

## 2020-07-21 DIAGNOSIS — J449 Chronic obstructive pulmonary disease, unspecified: Secondary | ICD-10-CM

## 2020-07-21 DIAGNOSIS — I77810 Thoracic aortic ectasia: Secondary | ICD-10-CM | POA: Diagnosis not present

## 2020-07-21 MED ORDER — IPRATROPIUM-ALBUTEROL 0.5-2.5 (3) MG/3ML IN SOLN: 3.0000 mL | Freq: Four times a day (QID) | RESPIRATORY_TRACT | 1 refills | Status: DC | PRN

## 2020-07-21 MED ORDER — DEXTROMETHORPHAN POLISTIREX ER 30 MG/5ML PO SUER
15.0000 mg | ORAL | 1 refills | Status: DC | PRN
Start: 2020-07-21 — End: 2020-11-18
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
## 2020-07-23 ENCOUNTER — Encounter: Payer: Self-pay | Admitting: Hospice and Palliative Medicine

## 2020-07-23 ENCOUNTER — Telehealth: Payer: Self-pay

## 2020-07-23 ENCOUNTER — Other Ambulatory Visit: Payer: Self-pay | Admitting: Hospice and Palliative Medicine

## 2020-07-23 NOTE — Telephone Encounter (Signed)
[place] Patient spoke with Judson Roch, she will complete order for pt's walker

## 2020-07-27 NOTE — Addendum Note (Signed)
Addended by: Corlis Hove on: 07/27/2020 04:32 PM   Modules accepted: Orders

## 2020-07-30 ENCOUNTER — Telehealth: Payer: Self-pay | Admitting: *Deleted

## 2020-07-30 NOTE — Telephone Encounter (Signed)
Received referral for low dose lung cancer screening CT scan. Message left at phone number listed in EMR for patient to call me back to facilitate scheduling scan.  

## 2020-07-31 ENCOUNTER — Other Ambulatory Visit: Payer: Self-pay

## 2020-07-31 ENCOUNTER — Emergency Department
Admission: EM | Admit: 2020-07-31 | Discharge: 2020-07-31 | Disposition: A | Discharge status: Home / Self Care | Payer: Medicaid Other | Attending: Emergency Medicine | Admitting: Emergency Medicine

## 2020-07-31 ENCOUNTER — Emergency Department: Payer: Medicaid Other

## 2020-07-31 DIAGNOSIS — S93401A Sprain of unspecified ligament of right ankle, initial encounter: Secondary | ICD-10-CM | POA: Diagnosis not present

## 2020-07-31 DIAGNOSIS — F17210 Nicotine dependence, cigarettes, uncomplicated: Secondary | ICD-10-CM | POA: Insufficient documentation

## 2020-07-31 DIAGNOSIS — I10 Essential (primary) hypertension: Secondary | ICD-10-CM | POA: Insufficient documentation

## 2020-07-31 DIAGNOSIS — E039 Hypothyroidism, unspecified: Secondary | ICD-10-CM | POA: Insufficient documentation

## 2020-07-31 DIAGNOSIS — M25571 Pain in right ankle and joints of right foot: Secondary | ICD-10-CM

## 2020-07-31 DIAGNOSIS — W109XXA Fall (on) (from) unspecified stairs and steps, initial encounter: Secondary | ICD-10-CM | POA: Diagnosis not present

## 2020-07-31 DIAGNOSIS — Z79899 Other long term (current) drug therapy: Secondary | ICD-10-CM | POA: Diagnosis not present

## 2020-07-31 DIAGNOSIS — S99911A Unspecified injury of right ankle, initial encounter: Secondary | ICD-10-CM | POA: Diagnosis present

## 2020-07-31 DIAGNOSIS — J449 Chronic obstructive pulmonary disease, unspecified: Secondary | ICD-10-CM | POA: Diagnosis not present

## 2020-07-31 DIAGNOSIS — J45909 Unspecified asthma, uncomplicated: Secondary | ICD-10-CM | POA: Insufficient documentation

## 2020-07-31 MED ORDER — VENLAFAXINE HCL ER 75 MG PO CP24
75.0000 mg | ORAL_CAPSULE | Freq: Once | ORAL | Status: AC
  Administered 2020-07-31: 75 mg via ORAL
  Filled 2020-07-31: qty 1

## 2020-07-31 MED ORDER — NAPROXEN 500 MG PO TABS: 500.0000 mg | ORAL_TABLET | Freq: Two times a day (BID) | ORAL | 0 refills | Status: AC

## 2020-07-31 MED ORDER — AMLODIPINE BESYLATE 5 MG PO TABS
5.0000 mg | ORAL_TABLET | Freq: Once | ORAL | Status: AC
  Administered 2020-07-31: 5 mg via ORAL
  Filled 2020-07-31: qty 1

## 2020-07-31 MED ORDER — PALIPERIDONE ER 3 MG PO TB24
9.0000 mg | ORAL_TABLET | Freq: Every day | ORAL | Status: DC
  Administered 2020-07-31: 9 mg via ORAL
  Filled 2020-07-31: qty 3

## 2020-07-31 MED ORDER — NAPROXEN 500 MG PO TABS
500.0000 mg | ORAL_TABLET | Freq: Once | ORAL | Status: AC
  Administered 2020-07-31: 500 mg via ORAL
  Filled 2020-07-31: qty 1

## 2020-07-31 MED ORDER — LISINOPRIL 10 MG PO TABS
40.0000 mg | ORAL_TABLET | Freq: Once | ORAL | Status: AC
  Administered 2020-07-31: 40 mg via ORAL
  Filled 2020-07-31: qty 4

## 2020-07-31 MED ORDER — ACETAMINOPHEN 500 MG PO TABS
1000.0000 mg | ORAL_TABLET | Freq: Once | ORAL | Status: AC
  Administered 2020-07-31: 1000 mg via ORAL
  Filled 2020-07-31: qty 2

## 2020-07-31 MED ORDER — PALIPERIDONE ER 6 MG PO TB24
9.0000 mg | ORAL_TABLET | Freq: Every day | ORAL | Status: DC
  Filled 2020-07-31: qty 1

## 2020-07-31 MED ORDER — LEVOTHYROXINE SODIUM 50 MCG PO TABS: 150.0000 ug | ORAL_TABLET | Freq: Every day | ORAL | Status: DC

## 2020-07-31 MED ORDER — TAMSULOSIN HCL 0.4 MG PO CAPS
0.4000 mg | ORAL_CAPSULE | Freq: Once | ORAL | Status: AC
  Administered 2020-07-31: 0.4 mg via ORAL
  Filled 2020-07-31: qty 1

## 2020-07-31 MED ORDER — DOCUSATE SODIUM 100 MG PO CAPS
100.0000 mg | ORAL_CAPSULE | Freq: Once | ORAL | Status: AC
  Administered 2020-07-31: 100 mg via ORAL
  Filled 2020-07-31: qty 1

## 2020-07-31 MED ORDER — PANTOPRAZOLE SODIUM 40 MG PO TBEC
40.0000 mg | DELAYED_RELEASE_TABLET | Freq: Once | ORAL | Status: AC
  Administered 2020-07-31: 40 mg via ORAL
  Filled 2020-07-31: qty 1

## 2020-07-31 NOTE — ED Triage Notes (Addendum)
Pt comes pov from home after tripping down bottom step and hurting right ankle.

## 2020-07-31 NOTE — ED Notes (Signed)
Cam boot applied to right foot. Pulses and cap refill intake. Ambulation trial completed. Teaching acknowledged on the boot.

## 2020-07-31 NOTE — Discharge Instructions (Addendum)
Please take tylenol, up to 1000mg  up to 4x daily as needed for pain.  Please take the Naproxen as prescribed, twice daily for inflammation and pain.   Please wear the walking boot at all times when up walking on your foot. Apply ice and elevate the foot several times per day. Follow up with orthopedics if it doesn't improve in 1-2 weeks. Otherwise, follow up with your primary care.
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
## 2020-08-04 ENCOUNTER — Telehealth: Payer: Self-pay

## 2020-08-04 NOTE — Telephone Encounter (Signed)
Faxed over referrral for Neurosurgery to [facility] at [phone] and [phone]. Christopher Mason

## 2020-08-05 ENCOUNTER — Ambulatory Visit (INDEPENDENT_AMBULATORY_CARE_PROVIDER_SITE_OTHER): Payer: Medicaid Other | Admitting: Plastic Surgery

## 2020-08-05 ENCOUNTER — Other Ambulatory Visit: Payer: Self-pay

## 2020-08-05 ENCOUNTER — Encounter: Payer: Self-pay | Admitting: Plastic Surgery

## 2020-08-05 VITALS — BP 115/79 | HR 82 | Ht 67.0 in | Wt 191.2 lb

## 2020-08-05 DIAGNOSIS — L905 Scar conditions and fibrosis of skin: Secondary | ICD-10-CM
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
## 2020-08-10 ENCOUNTER — Ambulatory Visit: Payer: Medicaid Other | Admitting: Hospice and Palliative Medicine

## 2020-08-10 ENCOUNTER — Telehealth: Payer: Self-pay | Admitting: *Deleted

## 2020-08-10 ENCOUNTER — Encounter: Payer: Self-pay | Admitting: *Deleted

## 2020-08-10 DIAGNOSIS — Z122 Encounter for screening for malignant neoplasm of respiratory organs: Secondary | ICD-10-CM

## 2020-08-10 DIAGNOSIS — Z87891 Personal history of nicotine dependence: Secondary | ICD-10-CM

## 2020-08-10 DIAGNOSIS — F17200 Nicotine dependence, unspecified, uncomplicated: Secondary | ICD-10-CM

## 2020-08-10 NOTE — Telephone Encounter (Signed)
Current every day smoker. 1 ppd x 40 years.

## 2020-08-10 NOTE — Addendum Note (Signed)
Addended by: Livia Snellen on: 08/10/2020 01:43 PM   Modules accepted: Orders

## 2020-08-10 NOTE — Telephone Encounter (Signed)
Received referral for initial lung cancer screening scan. Contacted patient and obtained smoking history,(former smoker, quit 06/2018, 1 ppd x 40 yrs) as well as answering questions related to screening process. Patient denies signs of lung cancer such as weight loss or hemoptysis. Patient denies comorbidity that would prevent curative treatment if lung cancer were found. Patient is scheduled for shared decision making visit and CT scan on 09/14/20 @ 1:15 pm.

## 2020-08-12 ENCOUNTER — Ambulatory Visit: Payer: Medicaid Other | Admitting: Hospice and Palliative Medicine

## 2020-08-12 ENCOUNTER — Other Ambulatory Visit: Payer: Self-pay

## 2020-08-12 ENCOUNTER — Encounter: Payer: Self-pay | Admitting: Hospice and Palliative Medicine

## 2020-08-12 VITALS — BP 118/86 | HR 90 | Temp 97.4°F | Resp 16 | Ht 67.0 in | Wt 190.0 lb

## 2020-08-12 DIAGNOSIS — G8929 Other chronic pain: Secondary | ICD-10-CM

## 2020-08-12 DIAGNOSIS — Z8612 Personal history of poliomyelitis: Secondary | ICD-10-CM | POA: Diagnosis not present

## 2020-08-12 DIAGNOSIS — I77810 Thoracic aortic ectasia: Secondary | ICD-10-CM

## 2020-08-12 DIAGNOSIS — M5441 Lumbago with sciatica, right side: Secondary | ICD-10-CM

## 2020-08-12 DIAGNOSIS — M5442 Lumbago with sciatica, left side: Secondary | ICD-10-CM | POA: Diagnosis not present
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
## 2020-08-13 ENCOUNTER — Other Ambulatory Visit: Payer: Self-pay | Admitting: Hospice and Palliative Medicine

## 2020-08-16 ENCOUNTER — Encounter: Payer: Self-pay | Admitting: Hospice and Palliative Medicine

## 2020-08-17 ENCOUNTER — Telehealth: Payer: Self-pay

## 2020-08-17 NOTE — Telephone Encounter (Signed)
Faxed Physiatry referral to [place] office. Christopher Mason

## 2020-08-19 ENCOUNTER — Telehealth: Payer: Self-pay

## 2020-08-19 NOTE — Telephone Encounter (Signed)
Patient referral was received at [facility]. Patient has been scheduled on 09-01-2020 aat 11:15a with provider Loree Fee Meeler NP. Referral not put in through workqueue. Loma Sousa

## 2020-09-09 ENCOUNTER — Telehealth: Payer: Self-pay

## 2020-09-09 NOTE — Telephone Encounter (Signed)
CMN signed by provider for medical supplies and placed in [facility] folder. Loma Sousa

## 2020-09-14 ENCOUNTER — Inpatient Hospital Stay: Payer: Medicaid Other | Attending: Hospice and Palliative Medicine | Admitting: Hospice and Palliative Medicine

## 2020-09-14 ENCOUNTER — Other Ambulatory Visit: Payer: Self-pay

## 2020-09-14 ENCOUNTER — Ambulatory Visit
Admission: RE | Admit: 2020-09-14 | Discharge: 2020-09-14 | Disposition: A | Discharge status: Home / Self Care | Payer: Medicaid Other | Source: Ambulatory Visit | Attending: Nurse Practitioner | Admitting: Nurse Practitioner

## 2020-09-14 DIAGNOSIS — Z122 Encounter for screening for malignant neoplasm of respiratory organs: Secondary | ICD-10-CM | POA: Insufficient documentation

## 2020-09-14 DIAGNOSIS — F17200 Nicotine dependence, unspecified, uncomplicated: Secondary | ICD-10-CM | POA: Diagnosis present

## 2020-09-14 DIAGNOSIS — Z87891 Personal history of nicotine dependence: Secondary | ICD-10-CM | POA: Insufficient documentation

## 2020-09-14 NOTE — Progress Notes (Signed)
Virtual Visit via Telephone Note  I connected with@ on 09/14/20 at@ by telephone and verified that I am speaking with the correct person using two identifiers.   I discussed the limitations of evaluation and management by telemedicine and the availability of in person appointments. The patient expressed understanding and agreed to proceed.  Location: Patient: [facility] Provider: Clinic   In accordance with CMS guidelines, patient has met eligibility criteria including age, absence of signs or symptoms of lung cancer.  Social History   Tobacco Use  . Smoking status: Current Every Day Smoker    Packs/day: 1.00    Years: 40.00    Pack years: 40.00    Types: Cigarettes  . Smokeless tobacco: Never Used  Vaping Use  . Vaping Use: Never used  Substance Use Topics  . Alcohol use: No  . Drug use: Yes    Frequency: 1.0 times per week    Types: Marijuana    Comment: last smoked today      A shared decision-making session was conducted prior to the performance of CT scan. This includes one or more decision aids, includes benefits and harms of screening, follow-up diagnostic testing, over-diagnosis, false positive rate, and total radiation exposure.   Counseling on the importance of adherence to annual lung cancer LDCT screening, impact of co-morbidities, and ability or willingness to undergo diagnosis and treatment is imperative for compliance of the program.   Counseling on the importance of continued smoking cessation for former smokers; the importance of smoking cessation for current smokers, and information about tobacco cessation interventions have been given to patient including [place] and 1800 quit [place] programs.   Written order for lung cancer screening with LDCT has been given to the patient and any and all questions have been answered to the best of my abilities.    Yearly follow up will be coordinated by Burgess Estelle, Thoracic Navigator.  Time Total: 10  minutes  Visit consisted of counseling and education dealing with complex health screening. Greater than 50%  of this time was spent counseling and coordinating care related to the above assessment and plan.  Signed by: Altha Harm, PhD, NP-C

## 2020-09-22 ENCOUNTER — Encounter: Payer: Self-pay | Admitting: *Deleted

## 2020-10-06 ENCOUNTER — Other Ambulatory Visit: Payer: Self-pay | Admitting: Internal Medicine

## 2020-10-06 ENCOUNTER — Other Ambulatory Visit: Payer: Self-pay | Admitting: Urology

## 2020-10-06 DIAGNOSIS — E785 Hyperlipidemia, unspecified: Secondary | ICD-10-CM

## 2020-10-06 DIAGNOSIS — I10 Essential (primary) hypertension: Secondary | ICD-10-CM

## 2020-10-11 ENCOUNTER — Other Ambulatory Visit: Payer: Self-pay

## 2020-10-11 ENCOUNTER — Encounter: Payer: Self-pay | Admitting: Nurse Practitioner

## 2020-10-11 ENCOUNTER — Ambulatory Visit: Payer: Medicaid Other | Admitting: Nurse Practitioner

## 2020-10-11 VITALS — BP 110/76 | HR 70 | Temp 97.3°F | Resp 16 | Ht 67.0 in | Wt 187.6 lb

## 2020-10-11 DIAGNOSIS — E039 Hypothyroidism, unspecified: Secondary | ICD-10-CM | POA: Diagnosis not present

## 2020-10-11 DIAGNOSIS — Z01818 Encounter for other preprocedural examination: Secondary | ICD-10-CM | POA: Diagnosis not present

## 2020-10-11 DIAGNOSIS — I10 Essential (primary) hypertension: Secondary | ICD-10-CM

## 2020-10-11 DIAGNOSIS — K219 Gastro-esophageal reflux disease without esophagitis: Secondary | ICD-10-CM | POA: Diagnosis not present
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
## 2020-10-12 ENCOUNTER — Telehealth: Payer: Self-pay

## 2020-10-12 NOTE — Telephone Encounter (Signed)
Surgical clearance completed by provider and faxed back to [facility] at [phone]. Christopher Mason

## 2020-10-21 ENCOUNTER — Telehealth: Payer: Self-pay

## 2020-10-21 ENCOUNTER — Other Ambulatory Visit: Payer: Self-pay | Admitting: Surgical

## 2020-10-21 DIAGNOSIS — L905 Scar conditions and fibrosis of skin: Secondary | ICD-10-CM

## 2020-10-21 NOTE — Telephone Encounter (Signed)
Call to [facility]- ph# [phone]:  requested that they fax surgical clearance determination to our office @ [phone]

## 2020-10-21 NOTE — Telephone Encounter (Signed)
Received call from Rodena Piety from plastic surgeon's office stating she has not received surgical clearance. I sent message to Courtney-Toni

## 2020-11-04 ENCOUNTER — Ambulatory Visit (INDEPENDENT_AMBULATORY_CARE_PROVIDER_SITE_OTHER): Payer: Medicaid Other

## 2020-11-04 ENCOUNTER — Other Ambulatory Visit: Payer: Self-pay

## 2020-11-04 VITALS — BP 130/89 | HR 68 | Ht 67.0 in | Wt 187.0 lb

## 2020-11-04 DIAGNOSIS — L905 Scar conditions and fibrosis of skin: Secondary | ICD-10-CM

## 2020-11-08 ENCOUNTER — Ambulatory Visit: Payer: Medicaid Other | Admitting: Internal Medicine

## 2020-11-09 ENCOUNTER — Other Ambulatory Visit: Payer: Self-pay

## 2020-11-09 MED ORDER — DOCUSATE SODIUM 100 MG PO CAPS: 100.0000 mg | ORAL_CAPSULE | Freq: Two times a day (BID) | ORAL | 4 refills | Status: DC

## 2020-11-09 MED ORDER — PANTOPRAZOLE SODIUM 40 MG PO TBEC: 40.0000 mg | DELAYED_RELEASE_TABLET | Freq: Every day | ORAL | 4 refills | Status: DC

## 2020-11-09 MED ORDER — PROPRANOLOL HCL 10 MG PO TABS: 10.0000 mg | ORAL_TABLET | Freq: Three times a day (TID) | ORAL | 4 refills | Status: DC

## 2020-11-15 ENCOUNTER — Other Ambulatory Visit: Payer: Self-pay

## 2020-11-15 ENCOUNTER — Encounter (HOSPITAL_BASED_OUTPATIENT_CLINIC_OR_DEPARTMENT_OTHER): Payer: Self-pay | Admitting: Plastic Surgery
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
## 2020-11-17 NOTE — Patient Instructions (Signed)
Patient was encouraged to stop/decrease smoking. He will call for any changes or conerns
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
## 2020-11-18 ENCOUNTER — Other Ambulatory Visit: Payer: Self-pay

## 2020-11-18 ENCOUNTER — Ambulatory Visit (INDEPENDENT_AMBULATORY_CARE_PROVIDER_SITE_OTHER): Payer: Medicaid Other | Admitting: Nurse Practitioner

## 2020-11-18 ENCOUNTER — Encounter: Payer: Self-pay | Admitting: Nurse Practitioner

## 2020-11-18 VITALS — BP 108/82 | HR 80 | Temp 98.3°F | Resp 16 | Ht 67.0 in | Wt 177.6 lb

## 2020-11-18 DIAGNOSIS — J449 Chronic obstructive pulmonary disease, unspecified: Secondary | ICD-10-CM | POA: Diagnosis not present

## 2020-11-18 DIAGNOSIS — I10 Essential (primary) hypertension: Secondary | ICD-10-CM | POA: Diagnosis not present

## 2020-11-18 DIAGNOSIS — K219 Gastro-esophageal reflux disease without esophagitis: Secondary | ICD-10-CM

## 2020-11-18 DIAGNOSIS — E039 Hypothyroidism, unspecified: Secondary | ICD-10-CM | POA: Diagnosis not present

## 2020-11-18 DIAGNOSIS — R059 Cough, unspecified: Secondary | ICD-10-CM

## 2020-11-18 MED ORDER — ALBUTEROL SULFATE HFA 108 (90 BASE) MCG/ACT IN AERS
2.0000 | INHALATION_SPRAY | Freq: Four times a day (QID) | RESPIRATORY_TRACT | 3 refills | Status: DC | PRN
Start: 2020-11-18 — End: 2022-03-01

## 2020-11-18 MED ORDER — DEXTROMETHORPHAN POLISTIREX ER 30 MG/5ML PO SUER: 15.0000 mg | ORAL | 1 refills | Status: DC | PRN

## 2020-11-18 MED ORDER — ALBUTEROL SULFATE HFA 108 (90 BASE) MCG/ACT IN AERS: 2.0000 | INHALATION_SPRAY | Freq: Four times a day (QID) | RESPIRATORY_TRACT | 3 refills | Status: DC | PRN

## 2020-11-18 MED ORDER — IPRATROPIUM-ALBUTEROL 0.5-2.5 (3) MG/3ML IN SOLN: 3.0000 mL | Freq: Four times a day (QID) | RESPIRATORY_TRACT | 1 refills | Status: DC | PRN
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
## 2020-11-22 ENCOUNTER — Ambulatory Visit (HOSPITAL_BASED_OUTPATIENT_CLINIC_OR_DEPARTMENT_OTHER): Payer: Medicaid Other | Admitting: Anesthesiology

## 2020-11-22 ENCOUNTER — Other Ambulatory Visit: Payer: Self-pay

## 2020-11-22 ENCOUNTER — Encounter (HOSPITAL_BASED_OUTPATIENT_CLINIC_OR_DEPARTMENT_OTHER)
Admission: RE | Disposition: A | Discharge status: Home / Self Care | Payer: Self-pay | Source: Home / Self Care | Attending: Plastic Surgery

## 2020-11-22 ENCOUNTER — Ambulatory Visit (HOSPITAL_BASED_OUTPATIENT_CLINIC_OR_DEPARTMENT_OTHER)
Admission: RE | Admit: 2020-11-22 | Discharge: 2020-11-22 | Disposition: A | Discharge status: Home / Self Care | Payer: Medicaid Other | Attending: Plastic Surgery | Admitting: Plastic Surgery

## 2020-11-22 ENCOUNTER — Encounter (HOSPITAL_BASED_OUTPATIENT_CLINIC_OR_DEPARTMENT_OTHER): Payer: Self-pay | Admitting: Plastic Surgery

## 2020-11-22 DIAGNOSIS — Z8612 Personal history of poliomyelitis: Secondary | ICD-10-CM | POA: Diagnosis not present

## 2020-11-22 DIAGNOSIS — Z886 Allergy status to analgesic agent status: Secondary | ICD-10-CM | POA: Diagnosis not present

## 2020-11-22 DIAGNOSIS — Z833 Family history of diabetes mellitus: Secondary | ICD-10-CM | POA: Insufficient documentation

## 2020-11-22 DIAGNOSIS — Z79899 Other long term (current) drug therapy: Secondary | ICD-10-CM | POA: Diagnosis not present

## 2020-11-22 DIAGNOSIS — L905 Scar conditions and fibrosis of skin: Secondary | ICD-10-CM

## 2020-11-22 DIAGNOSIS — F17210 Nicotine dependence, cigarettes, uncomplicated: Secondary | ICD-10-CM | POA: Insufficient documentation

## 2020-11-22 DIAGNOSIS — Z8249 Family history of ischemic heart disease and other diseases of the circulatory system: Secondary | ICD-10-CM | POA: Diagnosis not present

## 2020-11-22 HISTORY — PX: SKIN SPLIT GRAFT: SHX444

## 2020-11-22 HISTORY — PX: SCAR REVISION: SHX5285

## 2020-11-22 LAB — BASIC METABOLIC PANEL
Anion gap: 7 (ref 5–15)
BUN: 10 mg/dL (ref 6–20)
CO2: 24 mmol/L (ref 22–32)
Calcium: 9.4 mg/dL (ref 8.9–10.3)
Chloride: 106 mmol/L (ref 98–111)
Creatinine, Ser: 0.92 mg/dL (ref 0.61–1.24)
GFR, Estimated: >60 mL/min (ref >60)
Glucose, Bld: 99 mg/dL (ref 70–99)
Potassium: 4.1 mmol/L (ref 3.5–5.1)
Sodium: 137 mmol/L (ref 135–145)

## 2020-11-22 SURGERY — REVISION, SCAR
Anesthesia: General | Site: Axilla | Laterality: Left

## 2020-11-22 MED ORDER — ACETAMINOPHEN 500 MG PO TABS
ORAL_TABLET | ORAL | Status: AC
  Filled 2020-11-22: qty 2

## 2020-11-22 MED ORDER — OXYCODONE HCL 5 MG PO TABS
5.0000 mg | ORAL_TABLET | Freq: Once | ORAL | Status: AC | PRN
  Administered 2020-11-22: 5 mg via ORAL

## 2020-11-22 MED ORDER — PROPOFOL 10 MG/ML IV BOLUS
INTRAVENOUS | Status: DC | PRN
  Administered 2020-11-22: 100 mg via INTRAVENOUS
  Administered 2020-11-22: 50 mg via INTRAVENOUS
  Administered 2020-11-22: 100 mg via INTRAVENOUS

## 2020-11-22 MED ORDER — BUPIVACAINE LIPOSOME 1.3 % IJ SUSP
INTRAMUSCULAR | Status: DC | PRN
  Administered 2020-11-22: 10 mL via PERINEURAL

## 2020-11-22 MED ORDER — LIDOCAINE HCL (CARDIAC) PF 100 MG/5ML IV SOSY
PREFILLED_SYRINGE | INTRAVENOUS | Status: DC | PRN
  Administered 2020-11-22: 60 mg via INTRAVENOUS

## 2020-11-22 MED ORDER — PROMETHAZINE HCL 25 MG/ML IJ SOLN: 6.2500 mg | INTRAMUSCULAR | Status: DC | PRN

## 2020-11-22 MED ORDER — CEFAZOLIN SODIUM-DEXTROSE 2-4 GM/100ML-% IV SOLN
2.0000 g | INTRAVENOUS | Status: AC
  Administered 2020-11-22: 2 g via INTRAVENOUS

## 2020-11-22 MED ORDER — HYDROMORPHONE HCL 1 MG/ML IJ SOLN
INTRAMUSCULAR | Status: AC
  Filled 2020-11-22: qty 0.5

## 2020-11-22 MED ORDER — ONDANSETRON HCL 4 MG PO TABS: 4.0000 mg | ORAL_TABLET | Freq: Three times a day (TID) | ORAL | 0 refills | Status: DC | PRN

## 2020-11-22 MED ORDER — SUGAMMADEX SODIUM 200 MG/2ML IV SOLN
INTRAVENOUS | Status: DC | PRN
Start: 2020-11-22 — End: 2020-11-22
  Administered 2020-11-22: 200 mg via INTRAVENOUS

## 2020-11-22 MED ORDER — OXYCODONE HCL 5 MG/5ML PO SOLN: 5.0000 mg | Freq: Once | ORAL | Status: AC | PRN

## 2020-11-22 MED ORDER — FENTANYL CITRATE (PF) 100 MCG/2ML IJ SOLN
INTRAMUSCULAR | Status: AC
  Filled 2020-11-22: qty 2

## 2020-11-22 MED ORDER — BUPIVACAINE HCL (PF) 0.5 % IJ SOLN
INTRAMUSCULAR | Status: DC | PRN
  Administered 2020-11-22: 20 mL via PERINEURAL

## 2020-11-22 MED ORDER — HYDROMORPHONE HCL 1 MG/ML IJ SOLN
INTRAMUSCULAR | Status: AC
  Filled 2020-11-22: qty 1

## 2020-11-22 MED ORDER — LACTATED RINGERS IV SOLN: INTRAVENOUS | Status: DC

## 2020-11-22 MED ORDER — HYDROMORPHONE HCL 1 MG/ML IJ SOLN
0.2500 mg | INTRAMUSCULAR | Status: DC | PRN
  Administered 2020-11-22: 0.5 mg via INTRAVENOUS

## 2020-11-22 MED ORDER — DEXAMETHASONE SODIUM PHOSPHATE 4 MG/ML IJ SOLN
INTRAMUSCULAR | Status: DC | PRN
  Administered 2020-11-22: 4 mg via INTRAVENOUS

## 2020-11-22 MED ORDER — ACETAMINOPHEN 500 MG PO TABS
1000.0000 mg | ORAL_TABLET | Freq: Once | ORAL | Status: AC
  Administered 2020-11-22: 1000 mg via ORAL

## 2020-11-22 MED ORDER — MIDAZOLAM HCL 5 MG/5ML IJ SOLN
INTRAMUSCULAR | Status: DC | PRN
  Administered 2020-11-22: 2 mg via INTRAVENOUS

## 2020-11-22 MED ORDER — LACTATED RINGERS IV SOLN
INTRAVENOUS | Status: DC | PRN
  Administered 2020-11-22: 400 mL

## 2020-11-22 MED ORDER — ACETAMINOPHEN 10 MG/ML IV SOLN
INTRAVENOUS | Status: AC
  Filled 2020-11-22: qty 100

## 2020-11-22 MED ORDER — ROCURONIUM BROMIDE 100 MG/10ML IV SOLN
INTRAVENOUS | Status: DC | PRN
  Administered 2020-11-22: 70 mg via INTRAVENOUS

## 2020-11-22 MED ORDER — FENTANYL CITRATE (PF) 100 MCG/2ML IJ SOLN
INTRAMUSCULAR | Status: DC | PRN
  Administered 2020-11-22 (×3): 50 ug via INTRAVENOUS

## 2020-11-22 MED ORDER — ALBUTEROL SULFATE HFA 108 (90 BASE) MCG/ACT IN AERS
INHALATION_SPRAY | RESPIRATORY_TRACT | Status: DC | PRN
  Administered 2020-11-22: 6 via RESPIRATORY_TRACT
  Administered 2020-11-22: 2 via RESPIRATORY_TRACT

## 2020-11-22 MED ORDER — PROPOFOL 10 MG/ML IV BOLUS
INTRAVENOUS | Status: AC
  Filled 2020-11-22: qty 20

## 2020-11-22 MED ORDER — CEFAZOLIN SODIUM-DEXTROSE 2-4 GM/100ML-% IV SOLN
INTRAVENOUS | Status: AC
  Filled 2020-11-22: qty 100

## 2020-11-22 MED ORDER — KETAMINE HCL 100 MG/ML IJ SOLN
INTRAMUSCULAR | Status: AC
  Filled 2020-11-22: qty 1

## 2020-11-22 MED ORDER — KETAMINE HCL 10 MG/ML IJ SOLN
INTRAMUSCULAR | Status: DC | PRN
  Administered 2020-11-22: 40 mg via INTRAVENOUS

## 2020-11-22 MED ORDER — EPHEDRINE SULFATE 50 MG/ML IJ SOLN
INTRAMUSCULAR | Status: DC | PRN
  Administered 2020-11-22: 5 mg via INTRAVENOUS
  Administered 2020-11-22: 10 mg via INTRAVENOUS
  Administered 2020-11-22: 5 mg via INTRAVENOUS
  Administered 2020-11-22: 10 mg via INTRAVENOUS

## 2020-11-22 MED ORDER — PHENYLEPHRINE HCL (PRESSORS) 10 MG/ML IV SOLN
INTRAVENOUS | Status: DC | PRN
  Administered 2020-11-22: 80 ug via INTRAVENOUS

## 2020-11-22 MED ORDER — OXYCODONE HCL 5 MG PO TABS
ORAL_TABLET | ORAL | Status: AC
  Filled 2020-11-22: qty 1

## 2020-11-22 MED ORDER — LIDOCAINE HCL (PF) 2 % IJ SOLN
INTRAMUSCULAR | Status: AC
  Filled 2020-11-22: qty 5

## 2020-11-22 MED ORDER — DEXAMETHASONE SODIUM PHOSPHATE 10 MG/ML IJ SOLN
INTRAMUSCULAR | Status: AC
  Filled 2020-11-22: qty 1

## 2020-11-22 MED ORDER — BUPIVACAINE HCL (PF) 0.25 % IJ SOLN
INTRAMUSCULAR | Status: AC
  Filled 2020-11-22: qty 30

## 2020-11-22 MED ORDER — MIDAZOLAM HCL 2 MG/2ML IJ SOLN
INTRAMUSCULAR | Status: AC
  Filled 2020-11-22: qty 2

## 2020-11-22 MED ORDER — ONDANSETRON HCL 4 MG/2ML IJ SOLN
INTRAMUSCULAR | Status: AC
  Filled 2020-11-22: qty 2

## 2020-11-22 MED ORDER — ACETAMINOPHEN 10 MG/ML IV SOLN
1000.0000 mg | Freq: Once | INTRAVENOUS | Status: AC
  Administered 2020-11-22: 1000 mg via INTRAVENOUS

## 2020-11-22 MED ORDER — 0.9 % SODIUM CHLORIDE (POUR BTL) OPTIME
TOPICAL | Status: DC | PRN
  Administered 2020-11-22: 200 mL

## 2020-11-22 MED ORDER — HYDROCODONE-ACETAMINOPHEN 5-325 MG PO TABS: 1.0000 | ORAL_TABLET | Freq: Four times a day (QID) | ORAL | 0 refills | Status: DC | PRN

## 2020-11-22 SURGICAL SUPPLY — 115 items
ADH SKN CLS APL DERMABOND .7 (GAUZE/BANDAGES/DRESSINGS)
APL SKNCLS STERI-STRIP NONHPOA (GAUZE/BANDAGES/DRESSINGS) ×4
BALL CTTN LRG ABS STRL LF (GAUZE/BANDAGES/DRESSINGS)
BENZOIN TINCTURE PRP APPL 2/3 (GAUZE/BANDAGES/DRESSINGS) ×6 IMPLANT
BLADE CLIPPER SURG (BLADE) ×3 IMPLANT
BLADE DERMATOME SS (BLADE) IMPLANT
BLADE SURG 10 STRL SS (BLADE) ×3 IMPLANT
BLADE SURG 15 STRL LF DISP TIS (BLADE) ×2 IMPLANT
BLADE SURG 15 STRL SS (BLADE) ×3
BNDG COHESIVE 4X5 TAN ST LF (GAUZE/BANDAGES/DRESSINGS) IMPLANT
BNDG CONFORM 2 STRL LF (GAUZE/BANDAGES/DRESSINGS) IMPLANT
BNDG ELASTIC 2X5.8 VLCR STR LF (GAUZE/BANDAGES/DRESSINGS) IMPLANT
BNDG ELASTIC 3X5.8 VLCR STR LF (GAUZE/BANDAGES/DRESSINGS) IMPLANT
BNDG ELASTIC 4X5.8 VLCR STR LF (GAUZE/BANDAGES/DRESSINGS) IMPLANT
BNDG ELASTIC 6X5.8 VLCR STR LF (GAUZE/BANDAGES/DRESSINGS) IMPLANT
BNDG GAUZE ELAST 4 BULKY (GAUZE/BANDAGES/DRESSINGS) IMPLANT
BRUSH SCRUB EZ PLAIN DRY (MISCELLANEOUS) ×6 IMPLANT
CANISTER SUCT 1200ML W/VALVE (MISCELLANEOUS) IMPLANT
CLSR STERI-STRIP ANTIMIC 1/2X4 (GAUZE/BANDAGES/DRESSINGS) IMPLANT
CORD BIPOLAR FORCEPS 12FT (ELECTRODE) IMPLANT
COTTONBALL LRG STERILE PKG (GAUZE/BANDAGES/DRESSINGS) IMPLANT
COVER BACK TABLE 60X90IN (DRAPES) ×3 IMPLANT
COVER MAYO STAND STRL (DRAPES) ×3 IMPLANT
DECANTER SPIKE VIAL GLASS SM (MISCELLANEOUS) IMPLANT
DEPRESSOR TONGUE BLADE STERILE (MISCELLANEOUS) IMPLANT
DERMABOND ADVANCED (GAUZE/BANDAGES/DRESSINGS)
DERMABOND ADVANCED .7 DNX12 (GAUZE/BANDAGES/DRESSINGS) IMPLANT
DERMACARRIERS GRAFT 1 TO 1.5 (DISPOSABLE)
DRAPE LAPAROTOMY 100X72 PEDS (DRAPES) IMPLANT
DRAPE SURG 17X23 STRL (DRAPES) IMPLANT
DRAPE TOP SHEET (DRAPES) ×3 IMPLANT
DRAPE U-SHAPE 76X120 STRL (DRAPES) ×3 IMPLANT
DRAPE UTILITY XL STRL (DRAPES) ×3 IMPLANT
DRSG ADAPTIC 3X8 NADH LF (GAUZE/BANDAGES/DRESSINGS) IMPLANT
DRSG EMULSION OIL 3X3 NADH (GAUZE/BANDAGES/DRESSINGS) IMPLANT
DRSG PAD ABDOMINAL 8X10 ST (GAUZE/BANDAGES/DRESSINGS) ×6 IMPLANT
ELECT COATED BLADE 2.86 ST (ELECTRODE) IMPLANT
ELECT NEEDLE BLADE 2-5/6 (NEEDLE) IMPLANT
ELECT REM PT RETURN 9FT ADLT (ELECTROSURGICAL) ×3
ELECT REM PT RETURN 9FT PED (ELECTROSURGICAL)
ELECTRODE REM PT RETRN 9FT PED (ELECTROSURGICAL) IMPLANT
ELECTRODE REM PT RTRN 9FT ADLT (ELECTROSURGICAL) ×2 IMPLANT
GAUZE SPONGE 4X4 12PLY STRL (GAUZE/BANDAGES/DRESSINGS) ×3 IMPLANT
GAUZE SPONGE 4X4 12PLY STRL LF (GAUZE/BANDAGES/DRESSINGS) IMPLANT
GAUZE XEROFORM 1X8 LF (GAUZE/BANDAGES/DRESSINGS) IMPLANT
GAUZE XEROFORM 5X9 LF (GAUZE/BANDAGES/DRESSINGS) ×3 IMPLANT
GLOVE SRG 8 PF TXTR STRL LF DI (GLOVE) ×2 IMPLANT
GLOVE SURG ENC MOIS LTX SZ7.5 (GLOVE) ×3 IMPLANT
GLOVE SURG ENC TEXT LTX SZ7.5 (GLOVE) ×3 IMPLANT
GLOVE SURG UNDER POLY LF SZ8 (GLOVE) ×3
GOWN STRL REUS W/ TWL LRG LVL3 (GOWN DISPOSABLE) ×8 IMPLANT
GOWN STRL REUS W/TWL LRG LVL3 (GOWN DISPOSABLE) ×12
GRAFT DERMACARRIERS 1 TO 1.5 (DISPOSABLE) IMPLANT
HYDROGEN PEROXIDE 16OZ (MISCELLANEOUS) IMPLANT
NEEDLE HYPO 25X1 1.5 SAFETY (NEEDLE) IMPLANT
NEEDLE HYPO 30GX1 BEV (NEEDLE) IMPLANT
NEEDLE PRECISIONGLIDE 27X1.5 (NEEDLE) IMPLANT
NEEDLE SPNL 18GX3.5 QUINCKE PK (NEEDLE) ×3 IMPLANT
NS IRRIG 1000ML POUR BTL (IV SOLUTION) ×3 IMPLANT
PACK BASIN DAY SURGERY FS (CUSTOM PROCEDURE TRAY) ×3 IMPLANT
PAD CAST 3X4 CTTN HI CHSV (CAST SUPPLIES) IMPLANT
PAD CAST 4YDX4 CTTN HI CHSV (CAST SUPPLIES) IMPLANT
PADDING CAST COTTON 3X4 STRL (CAST SUPPLIES)
PADDING CAST COTTON 4X4 STRL (CAST SUPPLIES)
PENCIL SMOKE EVACUATOR (MISCELLANEOUS) ×3 IMPLANT
SHEET MEDIUM DRAPE 40X70 STRL (DRAPES) IMPLANT
SLEEVE SCD COMPRESS KNEE MED (STOCKING) ×3 IMPLANT
SLEEVE SURGEON STRL (DRAPES) ×3 IMPLANT
SPONGE GAUZE 2X2 8PLY STRL LF (GAUZE/BANDAGES/DRESSINGS) IMPLANT
SPONGE T-LAP 18X18 ~~LOC~~+RFID (SPONGE) ×6 IMPLANT
STAPLER INSORB 30 2030 C-SECTI (MISCELLANEOUS) ×3 IMPLANT
STAPLER VISISTAT 35W (STAPLE) ×3 IMPLANT
STOCKINETTE 4X48 STRL (DRAPES) IMPLANT
STOCKINETTE 6  STRL (DRAPES)
STOCKINETTE 6 STRL (DRAPES) IMPLANT
STOCKINETTE IMPERVIOUS LG (DRAPES) ×3 IMPLANT
STRIP CLOSURE SKIN 1/2X4 (GAUZE/BANDAGES/DRESSINGS) IMPLANT
STRIP SUTURE WOUND CLOSURE 1/2 (MISCELLANEOUS) ×3 IMPLANT
SUCTION FRAZIER HANDLE 10FR (MISCELLANEOUS)
SUCTION TUBE FRAZIER 10FR DISP (MISCELLANEOUS) IMPLANT
SURGILUBE 2OZ TUBE FLIPTOP (MISCELLANEOUS) IMPLANT
SUT CHROMIC 3 0 SH 27 (SUTURE) ×6 IMPLANT
SUT CHROMIC 4 0 P 3 18 (SUTURE) IMPLANT
SUT CHROMIC 4 0 PS 2 18 (SUTURE) IMPLANT
SUT CHROMIC 5 0 P 3 (SUTURE) IMPLANT
SUT ETHILON 3 0 PS 1 (SUTURE) ×9 IMPLANT
SUT ETHILON 4 0 P 3 18 (SUTURE) IMPLANT
SUT ETHILON 4 0 PS 2 18 (SUTURE) IMPLANT
SUT ETHILON 5 0 P 3 18 (SUTURE)
SUT MNCRL 6-0 UNDY P1 1X18 (SUTURE) IMPLANT
SUT MNCRL AB 3-0 PS2 18 (SUTURE) IMPLANT
SUT MNCRL AB 4-0 PS2 18 (SUTURE) IMPLANT
SUT MON AB 5-0 P3 18 (SUTURE) IMPLANT
SUT MONOCRYL 6-0 P1 1X18 (SUTURE)
SUT NYLON ETHILON 5-0 P-3 1X18 (SUTURE) IMPLANT
SUT PDS 3-0 CT2 (SUTURE) ×3
SUT PDS II 3-0 CT2 27 ABS (SUTURE) ×2 IMPLANT
SUT PLAIN 5 0 P 3 18 (SUTURE) IMPLANT
SUT PROLENE 4 0 PS 2 18 (SUTURE) IMPLANT
SUT SILK 3 0 SH CR/8 (SUTURE) IMPLANT
SUT SILK 4 0 PS 2 (SUTURE) IMPLANT
SUT VIC AB 5-0 P-3 18X BRD (SUTURE) IMPLANT
SUT VIC AB 5-0 P3 18 (SUTURE)
SUT VICRYL 4-0 PS2 18IN ABS (SUTURE) IMPLANT
SUT VICRYL 6 0 P 1 18 (SUTURE) IMPLANT
SUT VLOC 90 P-14 23 (SUTURE) ×6 IMPLANT
SYR 50ML LL SCALE MARK (SYRINGE) ×3 IMPLANT
SYR BULB EAR ULCER 3OZ GRN STR (SYRINGE) ×3 IMPLANT
SYR CONTROL 10ML LL (SYRINGE) IMPLANT
TOWEL GREEN STERILE FF (TOWEL DISPOSABLE) ×3 IMPLANT
TRAY DSU PREP LF (CUSTOM PROCEDURE TRAY) IMPLANT
TUBE CONNECTING 20X1/4 (TUBING) ×3 IMPLANT
TUBING INFILTRATION IT-10001 (TUBING) ×3 IMPLANT
UNDERPAD 30X36 HEAVY ABSORB (UNDERPADS AND DIAPERS) ×3 IMPLANT
YANKAUER SUCT BULB TIP NO VENT (SUCTIONS) ×3 IMPLANT

## 2020-11-22 NOTE — Anesthesia Preprocedure Evaluation (Addendum)
Anesthesia Evaluation  Patient identified by MRN, date of birth, ID band Patient awake    Reviewed: Allergy & Precautions, NPO status , Patient's Chart, lab work & pertinent test results, reviewed documented beta blocker date and time   Airway Mallampati: III  TM Distance: >3 FB Neck ROM: Full    Dental  (+) Dental Advisory Given, Edentulous Upper, Edentulous Lower   Pulmonary asthma , COPD, Current Smoker and Patient abstained from smoking.,  Current smoker 40 pack year history, 1ppd Uses rescue inhaler 2-3x/d   Pulmonary exam normal breath sounds clear to auscultation       Cardiovascular hypertension, Pt. on home beta blockers and Pt. on medications Normal cardiovascular exam Rhythm:Regular Rate:Normal     Neuro/Psych PSYCHIATRIC DISORDERS Depression Bipolar Disorder Schizophrenia negative neurological ROS     GI/Hepatic GERD  Controlled and Medicated,(+)     substance abuse  marijuana use, Last marijuana use yesterday- uses 5-7x/week   Endo/Other  Hypothyroidism   Renal/GU negative Renal ROS  negative genitourinary   Musculoskeletal negative musculoskeletal ROS (+)   Abdominal   Peds  Hematology negative hematology ROS (+)   Anesthesia Other Findings Burn scar contracture LUE  Reproductive/Obstetrics negative OB ROS                            Anesthesia Physical Anesthesia Plan  ASA: 3  Anesthesia Plan: General   Post-op Pain Management:    Induction: Intravenous  PONV Risk Score and Plan: 1 and Ondansetron, Dexamethasone, Midazolam and Treatment may vary due to age or medical condition  Airway Management Planned: Oral ETT  Additional Equipment: None  Intra-op Plan:   Post-operative Plan: Extubation in OR  Informed Consent: I have reviewed the patients History and Physical, chart, labs and discussed the procedure including the risks, benefits and alternatives for the  proposed anesthesia with the patient or authorized representative who has indicated his/her understanding and acceptance.     Dental advisory given  Plan Discussed with: CRNA  Anesthesia Plan Comments:        Anesthesia Quick Evaluation

## 2020-11-22 NOTE — Interval H&P Note (Signed)
Patient seen an examined. Risks and benefits discussed. Proceed with surgery.
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
## 2020-11-22 NOTE — Anesthesia Procedure Notes (Signed)
Anesthesia Regional Block: TAP block   Pre-Anesthetic Checklist: , timeout performed,  Correct Patient, Correct Site, Correct Laterality,  Correct Procedure, Correct Position, site marked,  Risks and benefits discussed,  Surgical consent,  Pre-op evaluation,  At surgeon's request and post-op pain management  Laterality: Left  Prep: Maximum Sterile Barrier Precautions used, chloraprep       Needles:  Injection technique: Single-shot  Needle Type: Echogenic Stimulator Needle     Needle Length: 9cm  Needle Gauge: 22     Additional Needles:   Procedures:,,,, ultrasound used (permanent image in chart),,    Narrative:  Start time: 11/22/2020 2:28 PM End time: 11/22/2020 2:33 PM Injection made incrementally with aspirations every 5 mL.  Performed by: Personally  Anesthesiologist: Pervis Hocking, DO  Additional Notes: Monitors applied. Injection made in 5cc increments. Good needle visualization. Patient tolerated procedure well.
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
## 2020-11-22 NOTE — Discharge Instructions (Addendum)
Activity: As tolerated, but avoid strenuous activity until follow up visit.  Diet: Regular  Wound Care: Keep dressing clean & dry until followup.  Redress the wound as needed for comfort.  Avoid showering until follow up appointment.  Special Instructions:  Call our office if any unusual problems occur such as pain, excessive bleeding, unrelieved nausea/vomiting, fever &/or chills.  Follow-up appointment: Scheduled for next week.  Post Anesthesia Home Care Instructions  Activity: Get plenty of rest for the remainder of the day. A responsible individual must stay with you for 24 hours following the procedure.  For the next 24 hours, DO NOT: -Drive a car -Paediatric nurse -Drink alcoholic beverages -Take any medication unless instructed by your physician -Make any legal decisions or sign important papers.  Meals: Start with liquid foods such as gelatin or soup. Progress to regular foods as tolerated. Avoid greasy, spicy, heavy foods. If nausea and/or vomiting occur, drink only clear liquids until the nausea and/or vomiting subsides. Call your physician if vomiting continues.  Special Instructions/Symptoms: Your throat may feel dry or sore from the anesthesia or the breathing tube placed in your throat during surgery. If this causes discomfort, gargle with warm salt water. The discomfort should disappear within 24 hours.  If you had a scopolamine patch placed behind your ear for the management of post- operative nausea and/or vomiting:  1. The medication in the patch is effective for 72 hours, after which it should be removed.  Wrap patch in a tissue and discard in the trash. Wash hands thoroughly with soap and water. 2. You may remove the patch earlier than 72 hours if you experience unpleasant side effects which may include dry mouth, dizziness or visual disturbances. 3. Avoid touching the patch. Wash your hands with soap and water after contact with the patch.     Oxycodone 5 mg  given at 4:10 p.m. No tylenol until 10 p.m.

## 2020-11-22 NOTE — Anesthesia Procedure Notes (Signed)
Procedure Name: Intubation Date/Time: 11/22/2020 1:13 PM Performed by: Maryella Shivers, CRNA Pre-anesthesia Checklist: Patient identified, Emergency Drugs available, Suction available and Patient being monitored Patient Re-evaluated:Patient Re-evaluated prior to induction Oxygen Delivery Method: Circle system utilized Preoxygenation: Pre-oxygenation with 100% oxygen Induction Type: IV induction Ventilation: Mask ventilation without difficulty Laryngoscope Size: Mac and 3 Grade View: Grade I Tube type: Oral Tube size: 8.0 mm Number of attempts: 1 Airway Equipment and Method: Stylet and Oral airway Placement Confirmation: ETT inserted through vocal cords under direct vision, positive ETCO2 and breath sounds checked- equal and bilateral Secured at: 22 cm Tube secured with: Tape Dental Injury: Teeth and Oropharynx as per pre-operative assessment
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
## 2020-11-23 ENCOUNTER — Encounter (HOSPITAL_BASED_OUTPATIENT_CLINIC_OR_DEPARTMENT_OTHER): Payer: Self-pay | Admitting: Plastic Surgery

## 2020-11-23 LAB — SURGICAL PATHOLOGY

## 2020-11-30 ENCOUNTER — Ambulatory Visit (INDEPENDENT_AMBULATORY_CARE_PROVIDER_SITE_OTHER): Payer: Medicaid Other | Admitting: Surgical

## 2020-11-30 ENCOUNTER — Encounter: Payer: Self-pay | Admitting: Surgical

## 2020-11-30 ENCOUNTER — Other Ambulatory Visit: Payer: Self-pay

## 2020-11-30 DIAGNOSIS — L905 Scar conditions and fibrosis of skin: Secondary | ICD-10-CM
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
## 2020-12-02 ENCOUNTER — Telehealth: Payer: Self-pay

## 2020-12-02 NOTE — Telephone Encounter (Signed)
[facility] ID: [id number]  Faxed [facility] request on 8/10, received success confirmation.   Received [facility] correspondence that stated: Pt was contacted with pricing options, as their health plan doesn't cover (non-covered item). We shipped the covered items and were unable to reach the pt regarding the non-covered items.

## 2020-12-03 ENCOUNTER — Telehealth: Payer: Self-pay | Admitting: Plastic Surgery

## 2020-12-03 NOTE — Telephone Encounter (Signed)
[facility], spoke to [place], explained that pt is not able to change dressings himself due to location of the wound. Before he left the office yesterday, PA dressed it pretty good but pt called earlier this afternoon reporting that he was bleeding through his dressing. I adv her that pt needs home health urgently if they are able to process the request that quick. She pulled up pt's records in [place] and forwarded the information to the correct dept as pt lives in [place], [place]. She adv that they would be in contact with our office asap.   Called pt back to inform, na, not able to leave message as voicemail in box is full. Will try again on Monday, 8/15.

## 2020-12-03 NOTE — Telephone Encounter (Signed)
Patient left a message saying he needs a home health nurse to come change his bandages as it is soaked through. Please call to advise. [phone]

## 2020-12-03 NOTE — Telephone Encounter (Signed)
Called [facility] to confirm what supplies pt was able to receive. Spoke to [place], she stated pt was able to receive 4 x 4 guaze and tape. Xeroform was not covered under Medicaid.

## 2020-12-04 ENCOUNTER — Encounter (HOSPITAL_COMMUNITY): Payer: Self-pay | Admitting: Emergency Medicine

## 2020-12-04 ENCOUNTER — Emergency Department (HOSPITAL_COMMUNITY)
Admission: EM | Admit: 2020-12-04 | Discharge: 2020-12-04 | Disposition: A | Discharge status: Home / Self Care | Payer: Medicaid Other | Attending: Emergency Medicine | Admitting: Emergency Medicine

## 2020-12-04 DIAGNOSIS — Y929 Unspecified place or not applicable: Secondary | ICD-10-CM | POA: Diagnosis not present

## 2020-12-04 DIAGNOSIS — T86821 Skin graft (allograft) (autograft) failure: Secondary | ICD-10-CM | POA: Diagnosis present

## 2020-12-04 DIAGNOSIS — Y939 Activity, unspecified: Secondary | ICD-10-CM | POA: Diagnosis not present

## 2020-12-04 DIAGNOSIS — F17210 Nicotine dependence, cigarettes, uncomplicated: Secondary | ICD-10-CM | POA: Diagnosis not present

## 2020-12-04 DIAGNOSIS — J449 Chronic obstructive pulmonary disease, unspecified: Secondary | ICD-10-CM | POA: Insufficient documentation

## 2020-12-04 DIAGNOSIS — J45909 Unspecified asthma, uncomplicated: Secondary | ICD-10-CM | POA: Diagnosis not present

## 2020-12-04 DIAGNOSIS — I10 Essential (primary) hypertension: Secondary | ICD-10-CM | POA: Insufficient documentation

## 2020-12-04 DIAGNOSIS — Y834 Other reconstructive surgery as the cause of abnormal reaction of the patient, or of later complication, without mention of misadventure at the time of the procedure: Secondary | ICD-10-CM | POA: Insufficient documentation

## 2020-12-04 DIAGNOSIS — X12XXXS Contact with other hot fluids, sequela: Secondary | ICD-10-CM | POA: Insufficient documentation

## 2020-12-04 DIAGNOSIS — T22042S Burn of unspecified degree of left axilla, sequela: Secondary | ICD-10-CM | POA: Insufficient documentation

## 2020-12-04 DIAGNOSIS — T8130XA Disruption of wound, unspecified, initial encounter: Secondary | ICD-10-CM

## 2020-12-04 DIAGNOSIS — S41102A Unspecified open wound of left upper arm, initial encounter: Secondary | ICD-10-CM | POA: Diagnosis not present

## 2020-12-04 LAB — CBC WITH DIFFERENTIAL/PLATELET
Abs Immature Granulocytes: 0.02 10*3/uL (ref 0.00–0.07)
Basophils Absolute: 0.1 10*3/uL (ref 0.0–0.1)
Basophils Relative: 1 %
Eosinophils Absolute: 0.3 10*3/uL (ref 0.0–0.5)
Eosinophils Relative: 4 %
HCT: 40.2 % (ref 39.0–52.0)
Hemoglobin: 13 g/dL (ref 13.0–17.0)
Immature Granulocytes: 0 %
Lymphocytes Relative: 23 %
Lymphs Abs: 1.8 10*3/uL (ref 0.7–4.0)
MCH: 30.8 pg (ref 26.0–34.0)
MCHC: 32.3 g/dL (ref 30.0–36.0)
MCV: 95.3 fL (ref 80.0–100.0)
Monocytes Absolute: 0.5 10*3/uL (ref 0.1–1.0)
Monocytes Relative: 7 %
Neutro Abs: 5.1 10*3/uL (ref 1.7–7.7)
Neutrophils Relative %: 65 %
Platelets: 258 10*3/uL (ref 150–400)
RBC: 4.22 MIL/uL (ref 4.22–5.81)
RDW: 14.5 % (ref 11.5–15.5)
WBC: 7.8 10*3/uL (ref 4.0–10.5)
nRBC: 0 % (ref 0.0–0.2)

## 2020-12-04 LAB — COMPREHENSIVE METABOLIC PANEL
ALT: 11 U/L (ref 0–44)
AST: 13 U/L — ABNORMAL LOW (ref 15–41)
Albumin: 3 g/dL — ABNORMAL LOW (ref 3.5–5.0)
Alkaline Phosphatase: 78 U/L (ref 38–126)
Anion gap: 9 (ref 5–15)
BUN: 5 mg/dL — ABNORMAL LOW (ref 8–23)
CO2: 23 mmol/L (ref 22–32)
Calcium: 9.1 mg/dL (ref 8.9–10.3)
Chloride: 104 mmol/L (ref 98–111)
Creatinine, Ser: 0.91 mg/dL (ref 0.61–1.24)
GFR, Estimated: >60 mL/min (ref >60)
Glucose, Bld: 142 mg/dL — ABNORMAL HIGH (ref 70–99)
Potassium: 3.8 mmol/L (ref 3.5–5.1)
Sodium: 136 mmol/L (ref 135–145)
Total Bilirubin: 0.2 mg/dL — ABNORMAL LOW (ref 0.3–1.2)
Total Protein: 6 g/dL — ABNORMAL LOW (ref 6.5–8.1)

## 2020-12-04 LAB — LACTIC ACID, PLASMA: Lactic Acid, Venous: 1.3 mmol/L (ref 0.5–1.9)

## 2020-12-04 MED ORDER — DOXYCYCLINE HYCLATE 100 MG PO TABS
100.0000 mg | ORAL_TABLET | Freq: Once | ORAL | Status: AC
  Administered 2020-12-04: 100 mg via ORAL
  Filled 2020-12-04: qty 1

## 2020-12-04 MED ORDER — DOXYCYCLINE HYCLATE 100 MG PO TABS: 100.0000 mg | ORAL_TABLET | Freq: Two times a day (BID) | ORAL | Status: DC
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
## 2020-12-04 NOTE — Discharge Instructions (Addendum)
We have applied a dressing to your wound.  Please wear the sling so that you do not put additional stress on the area until you are able to follow-up with your plastic surgeon in the next several days.  We have also prescribed a course of antibiotics just to prevent infection while the wound is somewhat vulnerable.  Please follow-up with your plastic surgeon as you have planned.  Also I would follow-up with your primary care doctor.  Do not hesitate to come back to the ED if you have any fever, nausea, vomiting.

## 2020-12-04 NOTE — ED Triage Notes (Addendum)
Pt to triage via [facility].  Pt had skin graft to posterior L upper arm on 8/1.  Due to location he is unable to change bandage.  Neighbors saw wound today and told him he needed to come to ED.  Denies pain and fever.  Large open wound.

## 2020-12-06 ENCOUNTER — Telehealth: Payer: Self-pay

## 2020-12-06 NOTE — Telephone Encounter (Signed)
Patient called to follow-up regarding his wound care supplies.  I spoke with [place], [place], and she will call Tomi Bamberger Smith[place]Fischer at [facility] for an update and have Tomi Bamberger follow-up with the patient.  I relayed this message to the patient.

## 2020-12-06 NOTE — Telephone Encounter (Signed)
Spoke to [place] @ [place] and she reported that she called [facility] and was told that there is not anyone that goes by the name of Estill Bamberg that works there, no orders were received and that they don't know what to do x pt. She stated that pt gets very confused when a lot of information is given to him. She also stated that he doesn't have any supplies with him. I adv her that I am working on it and will call pt to update.   [facility] per [facility] stated that they will have to decline pt due to staff shortages. Informed Tomi Bamberger @ [address] and pt.   I called pt and he stated that he didn't have a way to get to UC or to the drugstore to buy supplies. He said he got to the ED by ambulance. Adv pt that he has an appt on Wed and we will dress it for him but I'm working on trying to find an [facility] agency that will accept Medicaid.   Reached out to [employer] and he adv that he instructed pt that he was going to have buy some of the supplies because the Xeroform was not covered under his insurance. Contact pt and adv him that we are still working on getting a home health agency to come out there that accepts his insurance and that we will redress the wound when he comes for his appt Wed.   Called pt back and adv him to keep wound covered. He admitted he hasn't been taking showers because he was adv by [place], [place] not to. He reported where he lives has a lot of flies and he keeps it covered so that "flies won't get in it". Adv pt that once he comes for his appt on Wed, we will redress wound and Catalina [place] will discuss with him sending him to the [facility] so that they can start caring for it. Pt conveyed understanding.

## 2020-12-06 NOTE — Telephone Encounter (Signed)
Please see 8/12 telephone note x updates.
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
## 2020-12-06 NOTE — Telephone Encounter (Signed)
Tomi Bamberger called from [place] on Friday regarding patient.  I spoke with her this morning to verify that I had her correct name and company.  Tomi Bamberger said that when she arrived on Friday, the patient was supposed to have home health set up to assist him with his dressing changes and his help had not arrived.  She said that the blood had seeped through the bandage and he had taken the bandage off.  Tomi Bamberger said that it looks like the skin graft is no longer attached on the left-hand side.  She said that she put Xeroform, 4x4 and tape over the area.  Tomi Bamberger said that the patient is out of Xeroform, so she called [facility] and they said that his insurance company won't cover it.  Tomi Bamberger asked if we have Xeroform that we can share with him.  Please call Tomi Bamberger at [phone] after 10am today (she will be in a meeting until 10am).

## 2020-12-07 ENCOUNTER — Telehealth: Payer: Self-pay | Admitting: Plastic Surgery

## 2020-12-07 NOTE — Telephone Encounter (Signed)
Hope, RN with [facility], called to follow up on status of home health. Advised of note from 12/03/2020 and that patient will be in the office tomorrow and can have the bandages changed. Hope suggested [facility] due to its larger size and quick turn around. She would like a follow up call. She said a message can be left on her vm if you don't reach her. [phone]

## 2020-12-08 ENCOUNTER — Other Ambulatory Visit: Payer: Self-pay

## 2020-12-08 ENCOUNTER — Telehealth: Payer: Self-pay | Admitting: Plastic Surgery

## 2020-12-08 ENCOUNTER — Ambulatory Visit (INDEPENDENT_AMBULATORY_CARE_PROVIDER_SITE_OTHER): Payer: Medicaid Other | Admitting: Plastic Surgery

## 2020-12-08 DIAGNOSIS — L905 Scar conditions and fibrosis of skin: Secondary | ICD-10-CM
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
## 2020-12-08 NOTE — Telephone Encounter (Signed)
Spoke with [place] with [facility]. She is taking patients case for home care/wound care and dressing change. Faxed order  to [phone]: wet to dry dressings using 4x4 gauze, and normal saline, cover with ABD pad, and change every day. She will also follow up with Thressa Sheller with [facility].

## 2020-12-08 NOTE — Telephone Encounter (Signed)
Pt is here to see to see Dr. Claudia Desanctis x f/u and will advise next steps regarding wound care.

## 2020-12-08 NOTE — Telephone Encounter (Signed)
Thressa Sheller from [facility] called to advise that they are a mental health agency and cannot do dressing changes so the order needs to be sent to a home health agency. She would like a call to advise when it's been taken care of. Her number is [phone].

## 2020-12-15 ENCOUNTER — Telehealth: Payer: Self-pay | Admitting: Plastic Surgery

## 2020-12-15 NOTE — Telephone Encounter (Signed)
Returned Christopher Mason's call from [place] in [place]. Spoke with Dr. Claudia Desanctis, advised, they are only able to change wet to dry dressings twice a week, maybe 3 times depending on availability of staff.  He would like to have wound changed every day if possible. Patient has [employer] involved, but they do not provide wound care. He does not have neighbors nor family members to help with wound care. Okayed Christopher Mason to change wet to dry dressings twice, if possible three times a week for 9 weeks. Next follow up with Dr. Claudia Desanctis is 12/22/2020 at 12:00pm

## 2020-12-15 NOTE — Telephone Encounter (Signed)
Tiffany from [facility] is requesting verbal orders for care 2x a week for 9 weeks. Please call to advise/ update [phone] option 2. Thank you.

## 2020-12-20 ENCOUNTER — Ambulatory Visit: Payer: Self-pay | Admitting: Physician Assistant

## 2020-12-22 ENCOUNTER — Ambulatory Visit (INDEPENDENT_AMBULATORY_CARE_PROVIDER_SITE_OTHER): Payer: Medicaid Other | Admitting: Surgical

## 2020-12-22 ENCOUNTER — Other Ambulatory Visit: Payer: Self-pay

## 2020-12-22 ENCOUNTER — Telehealth: Payer: Self-pay

## 2020-12-22 ENCOUNTER — Encounter: Payer: Self-pay | Admitting: Plastic Surgery

## 2020-12-22 VITALS — BP 132/89 | HR 74 | Temp 97.7°F

## 2020-12-22 DIAGNOSIS — L905 Scar conditions and fibrosis of skin: Secondary | ICD-10-CM

## 2020-12-22 MED ORDER — SULFAMETHOXAZOLE-TRIMETHOPRIM 800-160 MG PO TABS: 1.0000 | ORAL_TABLET | Freq: Two times a day (BID) | ORAL | 0 refills | Status: AC
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
## 2020-12-22 NOTE — Telephone Encounter (Signed)
Tiffany called from [facility] to get skilled nursing approval for their frequency of twice a week for nine weeks.  Please call her at [phone], option 2 with approval.

## 2020-12-23 NOTE — Telephone Encounter (Signed)
Returned Tiffany's call from [place]. Advised to cover abdominal wound and to left axilla full-thickness ski graft with Xeroform, 4x4 guaze, ABD pads and use Medipore to secure. Change wound preferably every other day. Due to low staff issues, HHC can only service patient twice a week.

## 2020-12-24 ENCOUNTER — Encounter: Payer: Self-pay | Admitting: Physician Assistant

## 2020-12-29 ENCOUNTER — Other Ambulatory Visit: Payer: Self-pay | Admitting: Urology

## 2020-12-31 ENCOUNTER — Telehealth: Payer: Self-pay

## 2020-12-31 NOTE — Telephone Encounter (Signed)
CMN for [facility]er signed by provider and placed in [facility] folder.

## 2021-01-03 ENCOUNTER — Telehealth: Payer: Self-pay | Admitting: *Deleted

## 2021-01-03 NOTE — Telephone Encounter (Signed)
Received Orders from [facility] via of fax on (12/24/20) requesting signature from provider.  Given to provider to sign.    Orders signed and faxed back to [facility].  Confirmation received and copy scanned into the chart.//AB/CMA

## 2021-01-05 ENCOUNTER — Ambulatory Visit: Payer: Medicaid Other | Admitting: Surgical

## 2021-01-19 ENCOUNTER — Ambulatory Visit: Payer: Medicaid Other | Admitting: Surgical

## 2021-01-27 ENCOUNTER — Other Ambulatory Visit: Payer: Self-pay

## 2021-01-27 ENCOUNTER — Ambulatory Visit (INDEPENDENT_AMBULATORY_CARE_PROVIDER_SITE_OTHER): Payer: Medicaid Other | Admitting: Surgical

## 2021-01-27 DIAGNOSIS — L905 Scar conditions and fibrosis of skin: Secondary | ICD-10-CM
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
## 2021-02-16 ENCOUNTER — Other Ambulatory Visit: Payer: Self-pay | Admitting: Internal Medicine

## 2021-02-16 ENCOUNTER — Other Ambulatory Visit: Payer: Self-pay | Admitting: Urology

## 2021-02-16 DIAGNOSIS — E785 Hyperlipidemia, unspecified: Secondary | ICD-10-CM

## 2021-02-16 DIAGNOSIS — I10 Essential (primary) hypertension: Secondary | ICD-10-CM

## 2021-02-17 ENCOUNTER — Telehealth: Payer: Self-pay | Admitting: *Deleted

## 2021-02-17 NOTE — Telephone Encounter (Signed)
Received orders from [facility] requesting signature and return.  Given to provider to sign.  Orders signed and faxed back to [facility].  Confirmation received and copy scanned into the chart.//AB/CMA

## 2021-03-03 ENCOUNTER — Encounter: Payer: Medicaid Other | Admitting: Nurse Practitioner

## 2021-03-03 ENCOUNTER — Ambulatory Visit: Payer: Medicaid Other | Admitting: Surgical

## 2021-03-09 ENCOUNTER — Ambulatory Visit (INDEPENDENT_AMBULATORY_CARE_PROVIDER_SITE_OTHER): Payer: Medicaid Other | Admitting: Surgical

## 2021-03-09 ENCOUNTER — Telehealth: Payer: Self-pay

## 2021-03-09 ENCOUNTER — Other Ambulatory Visit: Payer: Self-pay

## 2021-03-09 ENCOUNTER — Encounter: Payer: Self-pay | Admitting: Surgical

## 2021-03-09 DIAGNOSIS — L905 Scar conditions and fibrosis of skin: Secondary | ICD-10-CM | POA: Diagnosis not present

## 2021-03-09 NOTE — Telephone Encounter (Deleted)
-----   Message from Edd Arbour, [place] sent at 03/08/2021  5:18 PM EST ----- Regarding: FL2 form requested Adam RN [phone]) [facility] stating that pt needs an FL2 form completed.  I called back no answer, LMOM that pt will need an appt to have form done and also he will need to bring one with him.  Please f/u to make sure he got message.  thanks

## 2021-03-09 NOTE — Telephone Encounter (Signed)
Adam RN [phone]) [facility] stating that pt needs an FL2 form completed.  I called back no answer, LMOM that pt will need an appt to have form done and also he will need to bring one with him desha/ spoke with adam RN and advised him that pt need to been seen for FL2 form fill out and gave vanessa to schedule appt
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
## 2021-03-14 ENCOUNTER — Encounter: Payer: Self-pay | Admitting: Nurse Practitioner

## 2021-03-14 ENCOUNTER — Ambulatory Visit: Payer: Medicaid Other | Admitting: Nurse Practitioner

## 2021-03-14 ENCOUNTER — Other Ambulatory Visit: Payer: Self-pay

## 2021-03-14 VITALS — BP 136/90 | HR 77 | Temp 98.1°F | Resp 16 | Ht 67.0 in | Wt 179.0 lb

## 2021-03-14 DIAGNOSIS — K219 Gastro-esophageal reflux disease without esophagitis: Secondary | ICD-10-CM

## 2021-03-14 DIAGNOSIS — Z0001 Encounter for general adult medical examination with abnormal findings: Secondary | ICD-10-CM

## 2021-03-14 DIAGNOSIS — E782 Mixed hyperlipidemia: Secondary | ICD-10-CM

## 2021-03-14 DIAGNOSIS — F17210 Nicotine dependence, cigarettes, uncomplicated: Secondary | ICD-10-CM

## 2021-03-14 DIAGNOSIS — R053 Chronic cough: Secondary | ICD-10-CM | POA: Diagnosis not present

## 2021-03-14 DIAGNOSIS — E039 Hypothyroidism, unspecified: Secondary | ICD-10-CM | POA: Diagnosis not present

## 2021-03-14 DIAGNOSIS — J439 Emphysema, unspecified: Secondary | ICD-10-CM

## 2021-03-14 DIAGNOSIS — I10 Essential (primary) hypertension: Secondary | ICD-10-CM | POA: Diagnosis not present

## 2021-03-14 DIAGNOSIS — I77810 Thoracic aortic ectasia: Secondary | ICD-10-CM | POA: Diagnosis not present

## 2021-03-14 DIAGNOSIS — E559 Vitamin D deficiency, unspecified: Secondary | ICD-10-CM

## 2021-03-14 DIAGNOSIS — Z125 Encounter for screening for malignant neoplasm of prostate: Secondary | ICD-10-CM

## 2021-03-14 DIAGNOSIS — E538 Deficiency of other specified B group vitamins: Secondary | ICD-10-CM

## 2021-03-14 DIAGNOSIS — Z23 Encounter for immunization: Secondary | ICD-10-CM

## 2021-03-14 MED ORDER — NICOTINE 14 MG/24HR TD PT24: 14.0000 mg | MEDICATED_PATCH | Freq: Every day | TRANSDERMAL | 2 refills | Status: DC

## 2021-03-14 MED ORDER — LISINOPRIL 40 MG PO TABS: 40.0000 mg | ORAL_TABLET | Freq: Every day | ORAL | 4 refills | Status: DC

## 2021-03-14 MED ORDER — AMLODIPINE BESYLATE 10 MG PO TABS: 10.0000 mg | ORAL_TABLET | Freq: Every day | ORAL | 4 refills | Status: DC

## 2021-03-14 MED ORDER — BENZONATATE 100 MG PO CAPS: 100.0000 mg | ORAL_CAPSULE | Freq: Two times a day (BID) | ORAL | 0 refills | Status: DC | PRN

## 2021-03-14 MED ORDER — DOCUSATE SODIUM 100 MG PO CAPS
100.0000 mg | ORAL_CAPSULE | Freq: Two times a day (BID) | ORAL | 4 refills | Status: DC
Start: 2021-03-14 — End: 2022-03-23

## 2021-03-14 MED ORDER — LEVOTHYROXINE SODIUM 150 MCG PO TABS: 150.0000 ug | ORAL_TABLET | Freq: Every day | ORAL | 4 refills | Status: DC

## 2021-03-14 MED ORDER — PRAVASTATIN SODIUM 10 MG PO TABS: 10.0000 mg | ORAL_TABLET | Freq: Every day | ORAL | 4 refills | Status: DC

## 2021-03-14 MED ORDER — ZOSTER VAC RECOMB ADJUVANTED 50 MCG/0.5ML IM SUSR: 0.5000 mL | Freq: Once | INTRAMUSCULAR | 0 refills | Status: AC
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
## 2021-03-15 ENCOUNTER — Telehealth: Payer: Self-pay

## 2021-03-15 NOTE — Telephone Encounter (Signed)
Completed medical records for [facility], mailed to: [address], [place] [address]

## 2021-03-21 ENCOUNTER — Other Ambulatory Visit: Payer: Self-pay | Admitting: Internal Medicine

## 2021-03-22 ENCOUNTER — Telehealth: Payer: Self-pay

## 2021-03-23 ENCOUNTER — Other Ambulatory Visit: Payer: Self-pay

## 2021-03-23 NOTE — Telephone Encounter (Signed)
Faxed [employer] [phone] [place] 2 forms with med list and we get confirmation that they received by fax

## 2021-03-25 ENCOUNTER — Telehealth: Payer: Self-pay | Admitting: Plastic Surgery

## 2021-03-25 NOTE — Telephone Encounter (Signed)
Returned Christopher Mason, from [facility] call. Patients order runs out today and wound has not improved. She would like to extend visits for twice a week and change treatment from xeroform to calcium alginate with silver. Advise her patient has a follow up with our PA on 03/31/2021. Will contact Matt and inquire about order.

## 2021-03-25 NOTE — Telephone Encounter (Signed)
Christopher Mason with [facility] called and is wondering if they could get orders to do wound care on the left arm for another 2 weeks and then they had a couple of other questions as well.  Please Follow Up.

## 2021-03-25 NOTE — Telephone Encounter (Signed)
Returned Cheryl's call. LMVM ok to continue 2 visit a week and change wound care calcium alginate with silver. Provided our fax number if signature is needed.

## 2021-03-31 ENCOUNTER — Ambulatory Visit (INDEPENDENT_AMBULATORY_CARE_PROVIDER_SITE_OTHER): Payer: Medicaid Other | Admitting: Surgical

## 2021-03-31 ENCOUNTER — Other Ambulatory Visit: Payer: Self-pay

## 2021-03-31 DIAGNOSIS — L905 Scar conditions and fibrosis of skin: Secondary | ICD-10-CM
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
## 2021-04-06 ENCOUNTER — Telehealth: Payer: Self-pay | Admitting: *Deleted

## 2021-04-06 NOTE — Telephone Encounter (Signed)
Received on (04/01/21) via of fax Physician Order from [place].  Requesting signature and return.  Given to provider to sign.    Physician order signed and faxed back to [place].  Confirmation received and copy scanned into the chart.//AB/CMA

## 2021-04-12 ENCOUNTER — Encounter: Payer: Self-pay | Admitting: Nurse Practitioner

## 2021-05-05 ENCOUNTER — Telehealth: Payer: Self-pay | Admitting: *Deleted

## 2021-05-05 NOTE — Telephone Encounter (Signed)
Received on (04/13/2021) via of fax [name] from [place].  Requesting signature and return.  Given to provider to sign and return.  Plan of Care signed and faxed back to [place].  Confirmation received and copy scanned into the chart.//AB/CMA

## 2021-05-12 ENCOUNTER — Encounter: Payer: Medicaid Other | Admitting: Nurse Practitioner

## 2021-05-17 ENCOUNTER — Ambulatory Visit: Payer: Medicaid Other | Admitting: Surgical

## 2021-05-17 ENCOUNTER — Other Ambulatory Visit: Payer: Self-pay | Admitting: Nurse Practitioner

## 2021-05-17 ENCOUNTER — Other Ambulatory Visit: Payer: Self-pay | Admitting: Urology

## 2021-05-17 DIAGNOSIS — F17210 Nicotine dependence, cigarettes, uncomplicated: Secondary | ICD-10-CM

## 2021-05-18 ENCOUNTER — Telehealth: Payer: Self-pay | Admitting: *Deleted

## 2021-05-18 NOTE — Telephone Encounter (Signed)
Received on (05/11/21) via of fax Physician Order from [place].  Requesting signature,date, and return.  Given to provider to sign.  Physician Order signed and faxed back to [facility].  Confirmation received and copy scanned into the chart.//AB/CMA

## 2021-05-24 ENCOUNTER — Ambulatory Visit (INDEPENDENT_AMBULATORY_CARE_PROVIDER_SITE_OTHER): Payer: Medicaid Other | Admitting: Surgical

## 2021-05-24 ENCOUNTER — Other Ambulatory Visit: Payer: Self-pay

## 2021-05-24 DIAGNOSIS — L905 Scar conditions and fibrosis of skin: Secondary | ICD-10-CM | POA: Diagnosis not present
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
## 2021-06-09 ENCOUNTER — Other Ambulatory Visit: Payer: Self-pay

## 2021-06-09 ENCOUNTER — Ambulatory Visit: Payer: Medicaid Other | Admitting: Nurse Practitioner

## 2021-06-09 ENCOUNTER — Encounter: Payer: Self-pay | Admitting: Nurse Practitioner

## 2021-06-09 VITALS — BP 128/86 | HR 70 | Temp 98.7°F | Resp 16 | Ht 67.0 in | Wt 184.6 lb

## 2021-06-09 DIAGNOSIS — E538 Deficiency of other specified B group vitamins: Secondary | ICD-10-CM | POA: Diagnosis not present

## 2021-06-09 DIAGNOSIS — E782 Mixed hyperlipidemia: Secondary | ICD-10-CM

## 2021-06-09 DIAGNOSIS — E559 Vitamin D deficiency, unspecified: Secondary | ICD-10-CM

## 2021-06-09 DIAGNOSIS — I10 Essential (primary) hypertension: Secondary | ICD-10-CM | POA: Diagnosis not present

## 2021-06-09 DIAGNOSIS — E039 Hypothyroidism, unspecified: Secondary | ICD-10-CM | POA: Diagnosis not present

## 2021-06-09 LAB — CMP14+EGFR
ALT: 13 IU/L (ref 0–44)
AST: 14 IU/L (ref 0–40)
Albumin/Globulin Ratio: 1.6 (ref 1.2–2.2)
Albumin: 4 g/dL (ref 3.8–4.8)
Alkaline Phosphatase: 120 IU/L (ref 44–121)
BUN/Creatinine Ratio: 13 (ref 10–24)
BUN: 11 mg/dL (ref 8–27)
Bilirubin Total: <0.2 mg/dL (ref 0.0–1.2)
CO2: 23 mmol/L (ref 20–29)
Calcium: 9.5 mg/dL (ref 8.6–10.2)
Chloride: 101 mmol/L (ref 96–106)
Creatinine, Ser: 0.85 mg/dL (ref 0.76–1.27)
Globulin, Total: 2.5 g/dL (ref 1.5–4.5)
Glucose: 167 mg/dL — ABNORMAL HIGH (ref 70–99)
Potassium: 4.1 mmol/L (ref 3.5–5.2)
Sodium: 137 mmol/L (ref 134–144)
Total Protein: 6.5 g/dL (ref 6.0–8.5)
eGFR: 99 mL/min/{1.73_m2} (ref >59)

## 2021-06-09 LAB — VITAMIN D 25 HYDROXY (VIT D DEFICIENCY, FRACTURES): Vit D, 25-Hydroxy: 12.7 ng/mL — ABNORMAL LOW (ref 30.0–100.0)

## 2021-06-09 LAB — LIPID PANEL
Chol/HDL Ratio: 4.4 ratio (ref 0.0–5.0)
Cholesterol, Total: 180 mg/dL (ref 100–199)
HDL: 41 mg/dL (ref >39)
LDL Chol Calc (NIH): 118 mg/dL — ABNORMAL HIGH (ref 0–99)
Triglycerides: 117 mg/dL (ref 0–149)
VLDL Cholesterol Cal: 21 mg/dL (ref 5–40)

## 2021-06-09 LAB — B12 AND FOLATE PANEL
Folate: 4.1 ng/mL (ref >3.0)
Vitamin B-12: 322 pg/mL (ref 232–1245)

## 2021-06-09 LAB — TSH+FREE T4
Free T4: 1.6 ng/dL (ref 0.82–1.77)
TSH: 0.165 u[IU]/mL — ABNORMAL LOW (ref 0.450–4.500)

## 2021-06-09 LAB — PSA, TOTAL AND FREE
PSA, Free Pct: 33.8 %
PSA, Free: 0.27 ng/mL
Prostate Specific Ag, Serum: 0.8 ng/mL (ref 0.0–4.0)

## 2021-06-09 MED ORDER — FOLIC ACID 1 MG PO TABS: 1.0000 mg | ORAL_TABLET | Freq: Every day | ORAL | 1 refills | Status: DC

## 2021-06-09 MED ORDER — CYANOCOBALAMIN 1000 MCG/ML IJ SOLN
1000.0000 ug | Freq: Once | INTRAMUSCULAR | Status: AC
  Administered 2021-06-09: 1000 ug via INTRAMUSCULAR
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
## 2021-06-17 ENCOUNTER — Other Ambulatory Visit: Payer: Self-pay

## 2021-06-17 ENCOUNTER — Telehealth: Payer: Self-pay

## 2021-06-17 MED ORDER — ERGOCALCIFEROL 1.25 MG (50000 UT) PO CAPS: 50000.0000 [IU] | ORAL_CAPSULE | ORAL | 5 refills | Status: DC

## 2021-06-17 NOTE — Progress Notes (Signed)
Please call patient with lab results: -- Vitamin D level is significantly low at 12.7.  Please send prescription to patient's pharmacy for vitamin D 50,000 unit capsule, take 1 capsule by mouth once weekly.  Will repeat vitamin D level in 3 to 6 months --Thyroid levels are stable --Metabolic panel is normal except for slightly elevated glucose level.  Liver and kidney function are normal. --Cholesterol levels are normal except for a slightly elevated LDL of 118.  Triglyceride level has improved and is normal compared to 2021. --His PSA level is normal --Vitamin B12 level is low normal.  It may benefit the patient to get a monthly B12 injection.  If he is interested in getting a B12 injection please have him schedule a nurse visit. --Folate level is borderline low.  Please send prescription for folic acid 1 mg tablet, take 1 tablet by mouth daily to pharmacy.  We will repeat B12 and folate level in 3 to 6 months.

## 2021-06-17 NOTE — Telephone Encounter (Signed)
-----   Message from Jonetta Osgood, NP sent at 06/17/2021  1:35 PM EST ----- Please call patient with lab results: -- Vitamin D level is significantly low at 12.7.  Please send prescription to patient's pharmacy for vitamin D 50,000 unit capsule, take 1 capsule by mouth once weekly.  Will repeat vitamin D level in 3 to 6 months --Thyroid levels are stable --Metabolic panel is normal except for slightly elevated glucose level.  Liver and kidney function are normal. --Cholesterol levels are normal except for a slightly elevated LDL of 118.  Triglyceride level has improved and is normal compared to 2021. --His PSA level is normal --Vitamin B12 level is low normal.  It may benefit the patient to get a monthly B12 injection.  If he is interested in getting a B12 injection please have him schedule a nurse visit. --Folate level is borderline low.  Please send prescription for folic acid 1 mg tablet, take 1 tablet by mouth daily to pharmacy.  We will repeat B12 and folate level in 3 to 6 months.

## 2021-06-27 ENCOUNTER — Telehealth: Payer: Self-pay | Admitting: *Deleted

## 2021-06-27 NOTE — Telephone Encounter (Signed)
Received on (06/21/2021) via of fax [place] from [facility] requesting signature,date, and return.  Given to provider to sign and return.   ? ?Home Health Certification and Plan of Care signed and faxed back to [facility].  Confirmation received and copy scanned into the chart.//AB/CMA ?

## 2021-07-04 ENCOUNTER — Ambulatory Visit: Payer: Medicaid Other | Admitting: Surgical

## 2021-07-05 ENCOUNTER — Ambulatory Visit: Payer: Medicaid Other | Admitting: Surgical

## 2021-07-08 ENCOUNTER — Ambulatory Visit: Payer: Medicaid Other

## 2021-07-08 ENCOUNTER — Other Ambulatory Visit: Payer: Self-pay

## 2021-07-08 DIAGNOSIS — E538 Deficiency of other specified B group vitamins: Secondary | ICD-10-CM

## 2021-07-08 MED ORDER — CYANOCOBALAMIN 1000 MCG/ML IJ SOLN
1000.0000 ug | Freq: Once | INTRAMUSCULAR | Status: AC
  Administered 2021-07-08: 1000 ug via INTRAMUSCULAR

## 2021-07-15 ENCOUNTER — Encounter: Payer: Self-pay | Admitting: Surgical

## 2021-07-15 ENCOUNTER — Ambulatory Visit (INDEPENDENT_AMBULATORY_CARE_PROVIDER_SITE_OTHER): Payer: Medicaid Other | Admitting: Surgical

## 2021-07-15 ENCOUNTER — Other Ambulatory Visit: Payer: Self-pay

## 2021-07-15 DIAGNOSIS — L905 Scar conditions and fibrosis of skin: Secondary | ICD-10-CM | POA: Diagnosis not present
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
## 2021-07-16 ENCOUNTER — Encounter: Payer: Self-pay | Admitting: Nurse Practitioner

## 2021-07-28 ENCOUNTER — Telehealth: Payer: Self-pay

## 2021-07-28 NOTE — Telephone Encounter (Signed)
Called and spoke with the patient regarding the message below.  Patient stated that his arm wound has opened back up and he would like for [place] to come back out and do dressing changes.  ? ?Informed the patient that he will need to give [place] a call back next week to schedule a follow-up appointment with [facility] for him look at his arm wound.  Patient verbalized understanding and agreed .//AB/CMA ?

## 2021-07-28 NOTE — Telephone Encounter (Signed)
Patient called to state that two days his left arm wound opened up again and is painful. He has redness but no bleeding. He would like a call a call back to discuss options.  ?

## 2021-08-02 ENCOUNTER — Telehealth: Payer: Self-pay

## 2021-08-02 ENCOUNTER — Encounter: Payer: Self-pay | Admitting: Surgical

## 2021-08-02 ENCOUNTER — Ambulatory Visit (INDEPENDENT_AMBULATORY_CARE_PROVIDER_SITE_OTHER): Payer: Medicaid Other | Admitting: Surgical

## 2021-08-02 DIAGNOSIS — L905 Scar conditions and fibrosis of skin: Secondary | ICD-10-CM | POA: Diagnosis not present

## 2021-08-02 NOTE — Telephone Encounter (Signed)
[place] called from [facility] regarding the referral we sent them for patient.  [place] said they currently do not have staff availability right now for the [place] area, so they will not be able to assist [facility].  She said that they do have staffing for [facility]. ?

## 2021-08-02 NOTE — Addendum Note (Signed)
Addended byRoetta Sessions on: 08/02/2021 11:14 AM ? ? Modules accepted: Orders ? ?
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
## 2021-08-05 NOTE — Telephone Encounter (Signed)
I will look and try to find another [facility] and contact patient.  ?

## 2021-08-08 ENCOUNTER — Ambulatory Visit (INDEPENDENT_AMBULATORY_CARE_PROVIDER_SITE_OTHER): Payer: Medicaid Other

## 2021-08-08 ENCOUNTER — Other Ambulatory Visit: Payer: Self-pay | Admitting: Nurse Practitioner

## 2021-08-08 ENCOUNTER — Telehealth: Payer: Self-pay

## 2021-08-08 DIAGNOSIS — E538 Deficiency of other specified B group vitamins: Secondary | ICD-10-CM | POA: Diagnosis not present

## 2021-08-08 MED ORDER — CYANOCOBALAMIN 1000 MCG/ML IJ SOLN
1000.0000 ug | Freq: Once | INTRAMUSCULAR | Status: AC
  Administered 2021-08-08: 1000 ug via INTRAMUSCULAR

## 2021-08-08 NOTE — Telephone Encounter (Signed)
Patient would like to know if we have called home health to come visit him.  Please call. ?

## 2021-08-08 NOTE — Telephone Encounter (Signed)
Spoke with Christopher Mason and informed him that I was trying to find [facility] agencies in his area. I will follow up with [facility] to see if they are able to assist him.  ?

## 2021-08-18 ENCOUNTER — Telehealth: Payer: Self-pay | Admitting: Surgical

## 2021-08-18 NOTE — Telephone Encounter (Signed)
Patient called to follow up on previous visit; states at last visit, provider discussed making arrangements for a home health nurse to come to his home to change his dressings. Patient still waiting for arrangements to be made. Requesting call back. ? ?Please advise at [phone].  ?

## 2021-08-18 NOTE — Telephone Encounter (Signed)
Spoke to Corinne Ports, CMA as she has been working on this for pt and has been unsuccessful. She is still working on finding home health for patient. Will forward to Corinne Ports to f/u with pt.  ?

## 2021-08-23 ENCOUNTER — Telehealth: Payer: Self-pay | Admitting: *Deleted

## 2021-08-23 NOTE — Telephone Encounter (Signed)
Received on (07/26/21) via of fax Discharge-Transfer Summary Report from [facility] for review.  Given to provider to review.   ? ?DischargeTransfer Summary Report reviewed and copy scanned into the chart.//AB/CMA ?

## 2021-08-24 ENCOUNTER — Ambulatory Visit: Payer: Medicaid Other | Admitting: Surgical

## 2021-08-24 NOTE — Telephone Encounter (Signed)
Called [facility] again to inquire on status of Christopher Mason request. She said she would have to have someone call me back.  ?

## 2021-08-25 ENCOUNTER — Telehealth: Payer: Self-pay

## 2021-08-25 ENCOUNTER — Ambulatory Visit (INDEPENDENT_AMBULATORY_CARE_PROVIDER_SITE_OTHER): Payer: Medicaid Other | Admitting: Surgical

## 2021-08-25 DIAGNOSIS — L905 Scar conditions and fibrosis of skin: Secondary | ICD-10-CM | POA: Diagnosis not present
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
## 2021-08-25 NOTE — Telephone Encounter (Signed)
I have tried faxing and calling [facility] to request home health. I emailed Blanchard Mane today who is the Division Manager to try and get some assistance.  ?

## 2021-08-30 ENCOUNTER — Telehealth: Payer: Self-pay | Admitting: *Deleted

## 2021-08-30 NOTE — Telephone Encounter (Signed)
Received on (07/26/21) via of fax Discharge-Transfer Summary Report from [facility] for provider to review.  Given to provider to review.   ? ?Provider reviewed the Discharge-Transfer Summary Report and copy scanned into the chart.//AB/CMA ?

## 2021-09-06 ENCOUNTER — Encounter: Payer: Self-pay | Admitting: Nurse Practitioner

## 2021-09-06 ENCOUNTER — Ambulatory Visit: Payer: Medicaid Other | Admitting: Nurse Practitioner

## 2021-09-06 VITALS — BP 110/74 | HR 80 | Temp 98.6°F | Resp 16 | Ht 67.0 in | Wt 195.0 lb

## 2021-09-06 DIAGNOSIS — B351 Tinea unguium: Secondary | ICD-10-CM

## 2021-09-06 DIAGNOSIS — F17200 Nicotine dependence, unspecified, uncomplicated: Secondary | ICD-10-CM | POA: Diagnosis not present

## 2021-09-06 DIAGNOSIS — E559 Vitamin D deficiency, unspecified: Secondary | ICD-10-CM | POA: Diagnosis not present

## 2021-09-06 DIAGNOSIS — E538 Deficiency of other specified B group vitamins: Secondary | ICD-10-CM

## 2021-09-06 DIAGNOSIS — E039 Hypothyroidism, unspecified: Secondary | ICD-10-CM | POA: Diagnosis not present

## 2021-09-06 MED ORDER — ERGOCALCIFEROL 1.25 MG (50000 UT) PO CAPS: 50000.0000 [IU] | ORAL_CAPSULE | ORAL | 5 refills | Status: DC

## 2021-09-06 MED ORDER — NICOTINE 21 MG/24HR TD PT24: 21.0000 mg | MEDICATED_PATCH | Freq: Every day | TRANSDERMAL | 5 refills | Status: DC

## 2021-09-06 MED ORDER — CYANOCOBALAMIN 1000 MCG/ML IJ SOLN
1000.0000 ug | Freq: Once | INTRAMUSCULAR | Status: AC
  Administered 2021-09-06: 1000 ug via INTRAMUSCULAR
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
## 2021-09-15 ENCOUNTER — Encounter: Payer: Self-pay | Admitting: Internal Medicine

## 2021-09-16 ENCOUNTER — Telehealth: Payer: Self-pay

## 2021-09-16 NOTE — Telephone Encounter (Signed)
Awaiting 09/06/21 office notes for podiatry referral-Toni

## 2021-09-22 ENCOUNTER — Other Ambulatory Visit: Payer: Self-pay | Admitting: *Deleted

## 2021-09-22 DIAGNOSIS — F17210 Nicotine dependence, cigarettes, uncomplicated: Secondary | ICD-10-CM

## 2021-09-22 DIAGNOSIS — Z87891 Personal history of nicotine dependence: Secondary | ICD-10-CM

## 2021-09-22 DIAGNOSIS — Z122 Encounter for screening for malignant neoplasm of respiratory organs: Secondary | ICD-10-CM

## 2021-09-22 NOTE — Progress Notes (Signed)
Ct c 

## 2021-09-25 ENCOUNTER — Encounter: Payer: Self-pay | Admitting: Nurse Practitioner

## 2021-09-26 NOTE — Telephone Encounter (Signed)
Podiatry referral sent via [facility] to [facility]

## 2021-09-26 NOTE — Telephone Encounter (Signed)
Podiatry appointment>>11/01/21 @ 10:00-Toni

## 2021-09-28 ENCOUNTER — Telehealth: Payer: Self-pay | Admitting: *Deleted

## 2021-09-28 NOTE — Telephone Encounter (Signed)
Received call from Quita Skye, [place] with [facility]. States he was concerned about pt because he has a wound and home health has not been coming. Explained we had attempted to set up home health but were unsuccessful finding care (per [facility]'s PA-C note from 08/25/21). Pt had stated he was going to see if family could help with dressing changes. Advised Adam RN that pt has an appointment next week but he could call the office to see if he can be seen sooner.

## 2021-10-03 ENCOUNTER — Ambulatory Visit
Admission: RE | Admit: 2021-10-03 | Discharge: 2021-10-03 | Disposition: A | Discharge status: Home / Self Care | Payer: Medicaid Other | Source: Ambulatory Visit | Attending: Acute Care | Admitting: Acute Care

## 2021-10-03 DIAGNOSIS — J432 Centrilobular emphysema: Secondary | ICD-10-CM | POA: Insufficient documentation

## 2021-10-03 DIAGNOSIS — I2510 Atherosclerotic heart disease of native coronary artery without angina pectoris: Secondary | ICD-10-CM | POA: Diagnosis not present

## 2021-10-03 DIAGNOSIS — Z122 Encounter for screening for malignant neoplasm of respiratory organs: Secondary | ICD-10-CM | POA: Diagnosis present

## 2021-10-03 DIAGNOSIS — F17210 Nicotine dependence, cigarettes, uncomplicated: Secondary | ICD-10-CM | POA: Diagnosis not present

## 2021-10-03 DIAGNOSIS — I700 Atherosclerosis of aorta: Secondary | ICD-10-CM | POA: Insufficient documentation

## 2021-10-03 DIAGNOSIS — Z87891 Personal history of nicotine dependence: Secondary | ICD-10-CM

## 2021-10-04 ENCOUNTER — Other Ambulatory Visit: Payer: Self-pay | Admitting: Nurse Practitioner

## 2021-10-04 DIAGNOSIS — E538 Deficiency of other specified B group vitamins: Secondary | ICD-10-CM

## 2021-10-05 ENCOUNTER — Telehealth: Payer: Self-pay | Admitting: Acute Care

## 2021-10-05 DIAGNOSIS — R911 Solitary pulmonary nodule: Secondary | ICD-10-CM

## 2021-10-05 DIAGNOSIS — F17210 Nicotine dependence, cigarettes, uncomplicated: Secondary | ICD-10-CM

## 2021-10-05 DIAGNOSIS — Z87891 Personal history of nicotine dependence: Secondary | ICD-10-CM

## 2021-10-05 NOTE — Telephone Encounter (Signed)
Called and spoke with [place]. She confirmed that Olivia Mackie was calling about the lung cancer screening CT. Below is a copy of the impressions:   IMPRESSION: 1. New nodular area of architectural distortion associated with an area of otherwise linear scarring in the right middle lobe. This is favored to be an area of post infectious or inflammatory scarring, but is technically categorized as Lung-RADS 4AS, suspicious. Follow up low-dose chest CT without contrast in 3 months (please use the following order, "CT CHEST LCS NODULE FOLLOW-UP W/O CM") is recommended. 2. The "S" modifier above refers to potentially clinically significant non lung cancer related findings. Specifically, there is aortic atherosclerosis, in addition to left main and three-vessel coronary artery disease. Please note that although the presence of coronary artery calcium documents the presence of coronary artery disease, the severity of this disease and any potential stenosis cannot be assessed on this non-gated CT examination. Assessment for potential risk factor modification, dietary therapy or pharmacologic therapy may be warranted, if clinically indicated. 3. Mild diffuse bronchial wall thickening with severe centrilobular and paraseptal emphysema; imaging findings suggestive of underlying COPD.  Judson Roch, can you please advise? Thanks!

## 2021-10-06 ENCOUNTER — Ambulatory Visit (INDEPENDENT_AMBULATORY_CARE_PROVIDER_SITE_OTHER): Payer: Medicaid Other | Admitting: Surgical

## 2021-10-06 ENCOUNTER — Ambulatory Visit: Payer: Medicaid Other | Admitting: Surgical

## 2021-10-06 ENCOUNTER — Encounter: Payer: Self-pay | Admitting: Surgical

## 2021-10-06 DIAGNOSIS — L905 Scar conditions and fibrosis of skin: Secondary | ICD-10-CM | POA: Diagnosis not present

## 2021-10-06 DIAGNOSIS — F17210 Nicotine dependence, cigarettes, uncomplicated: Secondary | ICD-10-CM

## 2021-10-06 NOTE — Telephone Encounter (Signed)
I have called the patient with the results of his scan. I explained that there was a finding of New nodular area of architectural distortion associated with an area of otherwise linear scarring in the right middle lobe. This is favored to be an area of post infectious or inflammatory scarring, but is technically categorized as Lung-RADS 4AS, suspicious.Recommendation is for a 3 month follow up scan to re-evaluate the area of concern.  Pt. States that he has not been sick. He is in agreement with 3 month follow up.   There was an incidental finding of aortic atherosclerosis, as well as atherosclerosis of the great vessels of the mediastinum and the coronary arteries, including calcified atherosclerotic plaque in the left main, left anterior descending, left circumflex and right coronary arteries. He has statin therapy ordered per Epic.   Langley Gauss, I have copied patient's PCP on this message. Please order 3 month follow up low dose CT Chest. Thanks so much

## 2021-10-06 NOTE — Telephone Encounter (Signed)
Order placed for 3 mth nodule f/u low dose CT.  
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
## 2021-10-11 ENCOUNTER — Ambulatory Visit (INDEPENDENT_AMBULATORY_CARE_PROVIDER_SITE_OTHER): Payer: Medicaid Other

## 2021-10-11 DIAGNOSIS — E538 Deficiency of other specified B group vitamins: Secondary | ICD-10-CM

## 2021-10-11 MED ORDER — CYANOCOBALAMIN 1000 MCG/ML IJ SOLN
1000.0000 ug | Freq: Once | INTRAMUSCULAR | Status: AC
  Administered 2021-10-11: 1000 ug via INTRAMUSCULAR

## 2021-11-08 ENCOUNTER — Ambulatory Visit: Payer: Medicaid Other

## 2021-11-08 DIAGNOSIS — E538 Deficiency of other specified B group vitamins: Secondary | ICD-10-CM

## 2021-11-08 MED ORDER — CYANOCOBALAMIN 1000 MCG/ML IJ SOLN
1000.0000 ug | Freq: Once | INTRAMUSCULAR | Status: AC
  Administered 2021-11-08: 1000 ug via INTRAMUSCULAR

## 2021-12-01 ENCOUNTER — Other Ambulatory Visit: Payer: Self-pay | Admitting: Internal Medicine

## 2021-12-01 ENCOUNTER — Other Ambulatory Visit: Payer: Self-pay | Admitting: Nurse Practitioner

## 2021-12-01 DIAGNOSIS — E538 Deficiency of other specified B group vitamins: Secondary | ICD-10-CM

## 2021-12-06 ENCOUNTER — Ambulatory Visit: Payer: Medicaid Other

## 2021-12-06 DIAGNOSIS — E538 Deficiency of other specified B group vitamins: Secondary | ICD-10-CM

## 2021-12-06 MED ORDER — CYANOCOBALAMIN 1000 MCG/ML IJ SOLN
1000.0000 ug | Freq: Once | INTRAMUSCULAR | Status: AC
  Administered 2021-12-06: 1000 ug via INTRAMUSCULAR

## 2021-12-21 ENCOUNTER — Ambulatory Visit (INDEPENDENT_AMBULATORY_CARE_PROVIDER_SITE_OTHER): Payer: Medicaid Other | Admitting: Surgical

## 2021-12-21 ENCOUNTER — Encounter: Payer: Self-pay | Admitting: Surgical

## 2021-12-21 DIAGNOSIS — L905 Scar conditions and fibrosis of skin: Secondary | ICD-10-CM | POA: Diagnosis not present
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
## 2022-01-03 ENCOUNTER — Ambulatory Visit
Admission: RE | Admit: 2022-01-03 | Discharge: 2022-01-03 | Disposition: A | Discharge status: Home / Self Care | Payer: Medicaid Other | Source: Ambulatory Visit | Attending: Acute Care | Admitting: Acute Care

## 2022-01-03 DIAGNOSIS — R911 Solitary pulmonary nodule: Secondary | ICD-10-CM | POA: Insufficient documentation

## 2022-01-03 DIAGNOSIS — Z87891 Personal history of nicotine dependence: Secondary | ICD-10-CM | POA: Insufficient documentation

## 2022-01-03 DIAGNOSIS — F17210 Nicotine dependence, cigarettes, uncomplicated: Secondary | ICD-10-CM | POA: Diagnosis present

## 2022-01-10 ENCOUNTER — Ambulatory Visit: Payer: Medicaid Other

## 2022-01-10 DIAGNOSIS — E538 Deficiency of other specified B group vitamins: Secondary | ICD-10-CM

## 2022-01-10 MED ORDER — CYANOCOBALAMIN 1000 MCG/ML IJ SOLN
1000.0000 ug | Freq: Once | INTRAMUSCULAR | Status: AC
  Administered 2022-01-10: 1000 ug via INTRAMUSCULAR

## 2022-01-11 ENCOUNTER — Telehealth: Payer: Self-pay | Admitting: Acute Care

## 2022-01-11 DIAGNOSIS — R911 Solitary pulmonary nodule: Secondary | ICD-10-CM

## 2022-01-11 NOTE — Telephone Encounter (Signed)
I have called the patient with the results of his low-dose CT.  This was a repeat 75-monthfollow-up CT.  The initial CT that was done 10/03/2021 was read as a lung RADS 4A as there was an nodular area of architectural distortion associated with an area of otherwise linear scarring in the right middle lobe that well favored to be infectious or inflammatory was technically read as a lung RADS 4A.  This area was 13.3 mm in size.  The 317-monthollow-up scan was done 01/03/2022.  There was no comment regarding resolution of the 13.3 mm nodule in the right middle lobe.  There was a comment regarding scarring in the right middle lobe.  I have had Dr. LaDerrill Kayeviewed the scan and she feels the patient needs a follow-up PET as it appears that the area of scarring has become thicker.. I explained this to the patient.  I Will order a PET scan as discussed with Dr. GoPatsey Bertholdand [facility] the results of [facility].  Denise please fax results to patient's PCP and let them know that per Dr. GoPatsey Bertholdeview of the scan we will do a PET scan to better evaluate the area of right middle lobe scarring.  I will place the order for the PET scan and I will message MaLesleigh Noebout getting the patient in with Dr. GoPatsey Bertholdfter the scan has been completed to review.  Patient verbalized understanding of the above and had no further questions at completion of the call.

## 2022-01-12 ENCOUNTER — Telehealth: Payer: Self-pay

## 2022-01-12 NOTE — Telephone Encounter (Signed)
Spoke to patient and scheduled appt 01/31/2022 at 2:30. He is aware and voiced his understanding.  Nothing further needed.

## 2022-01-12 NOTE — Telephone Encounter (Signed)
-----   Message from Magdalen Spatz, NP sent at 01/11/2022  4:09 PM EDT ----- Regarding: Needs PET follow up with Dr. Darnell Level. Pt. Will need follow up with Dr. Darnell Level once his PET has been done. I have ordered it today, 9/20. Can you see when it gets scheduled and get him scheduled with Dr. Darnell Level once completed to review?  Thanks so much Christopher Mason.

## 2022-01-12 NOTE — Telephone Encounter (Signed)
Will contact patient once PET has been scheduled.

## 2022-01-12 NOTE — Telephone Encounter (Signed)
Results faxed to PCP with plan for PET

## 2022-01-26 ENCOUNTER — Ambulatory Visit
Admission: RE | Admit: 2022-01-26 | Discharge: 2022-01-26 | Disposition: A | Discharge status: Home / Self Care | Payer: Medicaid Other | Source: Ambulatory Visit | Attending: Acute Care | Admitting: Acute Care

## 2022-01-26 DIAGNOSIS — I6523 Occlusion and stenosis of bilateral carotid arteries: Secondary | ICD-10-CM | POA: Insufficient documentation

## 2022-01-26 DIAGNOSIS — R911 Solitary pulmonary nodule: Secondary | ICD-10-CM | POA: Insufficient documentation

## 2022-01-26 DIAGNOSIS — J449 Chronic obstructive pulmonary disease, unspecified: Secondary | ICD-10-CM | POA: Diagnosis not present

## 2022-01-26 LAB — GLUCOSE, CAPILLARY: Glucose-Capillary: 101 mg/dL — ABNORMAL HIGH (ref 70–99)

## 2022-01-26 MED ORDER — FLUDEOXYGLUCOSE F - 18 (FDG) INJECTION
10.1000 | Freq: Once | INTRAVENOUS | Status: AC | PRN
  Administered 2022-01-26: 11 via INTRAVENOUS

## 2022-01-31 ENCOUNTER — Other Ambulatory Visit: Payer: Self-pay

## 2022-01-31 ENCOUNTER — Encounter: Payer: Self-pay | Admitting: Pulmonary Disease

## 2022-01-31 ENCOUNTER — Ambulatory Visit (INDEPENDENT_AMBULATORY_CARE_PROVIDER_SITE_OTHER): Payer: Medicaid Other | Admitting: Pulmonary Disease

## 2022-01-31 VITALS — BP 110/78 | HR 77 | Temp 98.1°F | Ht 67.0 in | Wt 203.0 lb

## 2022-01-31 DIAGNOSIS — F17210 Nicotine dependence, cigarettes, uncomplicated: Secondary | ICD-10-CM

## 2022-01-31 DIAGNOSIS — J449 Chronic obstructive pulmonary disease, unspecified: Secondary | ICD-10-CM | POA: Diagnosis not present

## 2022-01-31 DIAGNOSIS — R0602 Shortness of breath: Secondary | ICD-10-CM

## 2022-01-31 DIAGNOSIS — R9389 Abnormal findings on diagnostic imaging of other specified body structures: Secondary | ICD-10-CM | POA: Diagnosis not present

## 2022-01-31 DIAGNOSIS — Z8612 Personal history of poliomyelitis: Secondary | ICD-10-CM

## 2022-01-31 DIAGNOSIS — Z122 Encounter for screening for malignant neoplasm of respiratory organs: Secondary | ICD-10-CM

## 2022-01-31 DIAGNOSIS — Z87891 Personal history of nicotine dependence: Secondary | ICD-10-CM

## 2022-01-31 NOTE — Patient Instructions (Signed)
We are going to get breathing test to evaluate your lungs.  We are going to get an overnight oxygen level.  Your chest can did not show any evidence of cancer so we are going to make sure that you get a scan every year through the program as before.  When you use your nicotine patches, place 1 new patch in the morning and remove the patch before going to bed in the evening.  In the morning repeat the process with a new patch.  We will see you in follow-up in 2 to 3 months time.
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
## 2022-02-07 ENCOUNTER — Ambulatory Visit (INDEPENDENT_AMBULATORY_CARE_PROVIDER_SITE_OTHER): Payer: Medicaid Other

## 2022-02-07 DIAGNOSIS — E538 Deficiency of other specified B group vitamins: Secondary | ICD-10-CM | POA: Diagnosis not present

## 2022-02-07 MED ORDER — CYANOCOBALAMIN 1000 MCG/ML IJ SOLN
1000.0000 ug | Freq: Once | INTRAMUSCULAR | Status: AC
  Administered 2022-02-07: 1000 ug via INTRAMUSCULAR

## 2022-02-23 ENCOUNTER — Other Ambulatory Visit: Payer: Self-pay | Admitting: Nurse Practitioner

## 2022-02-23 DIAGNOSIS — F17200 Nicotine dependence, unspecified, uncomplicated: Secondary | ICD-10-CM

## 2022-02-23 DIAGNOSIS — E538 Deficiency of other specified B group vitamins: Secondary | ICD-10-CM

## 2022-02-23 NOTE — Telephone Encounter (Signed)
It ok for this pres nicotine patch

## 2022-02-24 NOTE — Telephone Encounter (Signed)
Meds sent to pharmacy.

## 2022-03-01 ENCOUNTER — Telehealth: Payer: Self-pay

## 2022-03-01 ENCOUNTER — Encounter: Payer: Medicaid Other | Admitting: Nurse Practitioner

## 2022-03-01 ENCOUNTER — Encounter: Payer: Self-pay | Admitting: Nurse Practitioner

## 2022-03-01 ENCOUNTER — Ambulatory Visit: Payer: Medicaid Other | Admitting: Nurse Practitioner

## 2022-03-01 VITALS — BP 159/88 | HR 74 | Temp 98.2°F | Resp 16 | Ht 67.0 in | Wt 209.2 lb

## 2022-03-01 DIAGNOSIS — J449 Chronic obstructive pulmonary disease, unspecified: Secondary | ICD-10-CM

## 2022-03-01 DIAGNOSIS — E039 Hypothyroidism, unspecified: Secondary | ICD-10-CM

## 2022-03-01 DIAGNOSIS — Z23 Encounter for immunization: Secondary | ICD-10-CM | POA: Diagnosis not present

## 2022-03-01 DIAGNOSIS — E538 Deficiency of other specified B group vitamins: Secondary | ICD-10-CM | POA: Diagnosis not present

## 2022-03-01 DIAGNOSIS — E559 Vitamin D deficiency, unspecified: Secondary | ICD-10-CM | POA: Diagnosis not present

## 2022-03-01 DIAGNOSIS — Z760 Encounter for issue of repeat prescription: Secondary | ICD-10-CM

## 2022-03-01 DIAGNOSIS — E782 Mixed hyperlipidemia: Secondary | ICD-10-CM | POA: Diagnosis not present

## 2022-03-01 DIAGNOSIS — J432 Centrilobular emphysema: Secondary | ICD-10-CM

## 2022-03-01 MED ORDER — ALBUTEROL SULFATE HFA 108 (90 BASE) MCG/ACT IN AERS: 2.0000 | INHALATION_SPRAY | Freq: Four times a day (QID) | RESPIRATORY_TRACT | 3 refills | Status: DC | PRN

## 2022-03-01 MED ORDER — BREZTRI AEROSPHERE 160-9-4.8 MCG/ACT IN AERO: 2.0000 | INHALATION_SPRAY | Freq: Two times a day (BID) | RESPIRATORY_TRACT | 11 refills | Status: DC

## 2022-03-01 NOTE — Telephone Encounter (Signed)
Faxed easter seal [phone] FL2 form
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
## 2022-03-02 ENCOUNTER — Institutional Professional Consult (permissible substitution): Payer: Medicaid Other | Admitting: Plastic Surgery

## 2022-03-08 ENCOUNTER — Ambulatory Visit (INDEPENDENT_AMBULATORY_CARE_PROVIDER_SITE_OTHER): Payer: Medicaid Other | Admitting: Plastic Surgery

## 2022-03-08 ENCOUNTER — Encounter: Payer: Self-pay | Admitting: Plastic Surgery

## 2022-03-08 VITALS — BP 130/87 | HR 79

## 2022-03-08 DIAGNOSIS — L905 Scar conditions and fibrosis of skin: Secondary | ICD-10-CM

## 2022-03-08 DIAGNOSIS — F17210 Nicotine dependence, cigarettes, uncomplicated: Secondary | ICD-10-CM
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
## 2022-03-09 ENCOUNTER — Institutional Professional Consult (permissible substitution): Payer: Medicaid Other | Admitting: Plastic Surgery

## 2022-03-16 LAB — CMP14+EGFR
ALT: 19 IU/L (ref 0–44)
AST: 17 IU/L (ref 0–40)
Albumin/Globulin Ratio: 1.7 (ref 1.2–2.2)
Albumin: 4.4 g/dL (ref 3.9–4.9)
Alkaline Phosphatase: 119 IU/L (ref 44–121)
BUN/Creatinine Ratio: 8 — ABNORMAL LOW (ref 10–24)
BUN: 8 mg/dL (ref 8–27)
Bilirubin Total: <0.2 mg/dL (ref 0.0–1.2)
CO2: 23 mmol/L (ref 20–29)
Calcium: 9.8 mg/dL (ref 8.6–10.2)
Chloride: 100 mmol/L (ref 96–106)
Creatinine, Ser: 1.01 mg/dL (ref 0.76–1.27)
Globulin, Total: 2.6 g/dL (ref 1.5–4.5)
Glucose: 108 mg/dL — ABNORMAL HIGH (ref 70–99)
Potassium: 4.4 mmol/L (ref 3.5–5.2)
Sodium: 138 mmol/L (ref 134–144)
Total Protein: 7 g/dL (ref 6.0–8.5)
eGFR: 84 mL/min/{1.73_m2} (ref >59)

## 2022-03-16 LAB — TSH+FREE T4
Free T4: 1.64 ng/dL (ref 0.82–1.77)
TSH: 0.344 u[IU]/mL — ABNORMAL LOW (ref 0.450–4.500)

## 2022-03-16 LAB — CBC WITH DIFFERENTIAL/PLATELET
Basophils Absolute: 0.1 10*3/uL (ref 0.0–0.2)
Basos: 1 %
EOS (ABSOLUTE): 0.3 10*3/uL (ref 0.0–0.4)
Eos: 5 %
Hematocrit: 42.7 % (ref 37.5–51.0)
Hemoglobin: 13.9 g/dL (ref 13.0–17.7)
Immature Grans (Abs): 0 10*3/uL (ref 0.0–0.1)
Immature Granulocytes: 0 %
Lymphocytes Absolute: 1.5 10*3/uL (ref 0.7–3.1)
Lymphs: 25 %
MCH: 30.5 pg (ref 26.6–33.0)
MCHC: 32.6 g/dL (ref 31.5–35.7)
MCV: 94 fL (ref 79–97)
Monocytes Absolute: 0.4 10*3/uL (ref 0.1–0.9)
Monocytes: 7 %
Neutrophils Absolute: 3.9 10*3/uL (ref 1.4–7.0)
Neutrophils: 62 %
Platelets: 246 10*3/uL (ref 150–450)
RBC: 4.55 x10E6/uL (ref 4.14–5.80)
RDW: 13.6 % (ref 11.6–15.4)
WBC: 6.2 10*3/uL (ref 3.4–10.8)

## 2022-03-16 LAB — B12 AND FOLATE PANEL
Folate: >20 ng/mL (ref >3.0)
Vitamin B-12: 733 pg/mL (ref 232–1245)

## 2022-03-16 LAB — VITAMIN D 25 HYDROXY (VIT D DEFICIENCY, FRACTURES): Vit D, 25-Hydroxy: 38.7 ng/mL (ref 30.0–100.0)

## 2022-03-16 LAB — LIPID PANEL
Chol/HDL Ratio: 4.2 ratio (ref 0.0–5.0)
Cholesterol, Total: 182 mg/dL (ref 100–199)
HDL: 43 mg/dL (ref >39)
LDL Chol Calc (NIH): 116 mg/dL — ABNORMAL HIGH (ref 0–99)
Triglycerides: 127 mg/dL (ref 0–149)
VLDL Cholesterol Cal: 23 mg/dL (ref 5–40)

## 2022-03-16 LAB — IRON,TIBC AND FERRITIN PANEL
Ferritin: 15 ng/mL — ABNORMAL LOW (ref 30–400)
Iron Saturation: 15 % (ref 15–55)
Iron: 55 ug/dL (ref 38–169)
Total Iron Binding Capacity: 366 ug/dL (ref 250–450)
UIBC: 311 ug/dL (ref 111–343)

## 2022-03-20 ENCOUNTER — Encounter: Payer: Self-pay | Admitting: Nurse Practitioner

## 2022-03-20 ENCOUNTER — Ambulatory Visit (INDEPENDENT_AMBULATORY_CARE_PROVIDER_SITE_OTHER): Payer: Medicaid Other | Admitting: Nurse Practitioner

## 2022-03-20 VITALS — BP 108/78 | HR 80 | Temp 97.9°F | Resp 16 | Ht 67.0 in | Wt 213.2 lb

## 2022-03-20 DIAGNOSIS — Z0001 Encounter for general adult medical examination with abnormal findings: Secondary | ICD-10-CM | POA: Diagnosis not present

## 2022-03-20 DIAGNOSIS — Z1211 Encounter for screening for malignant neoplasm of colon: Secondary | ICD-10-CM

## 2022-03-20 DIAGNOSIS — Z23 Encounter for immunization: Secondary | ICD-10-CM

## 2022-03-20 DIAGNOSIS — E039 Hypothyroidism, unspecified: Secondary | ICD-10-CM | POA: Diagnosis not present

## 2022-03-20 DIAGNOSIS — Z1212 Encounter for screening for malignant neoplasm of rectum: Secondary | ICD-10-CM

## 2022-03-20 MED ORDER — LEVOTHYROXINE SODIUM 137 MCG PO TABS: 137.0000 ug | ORAL_TABLET | Freq: Every day | ORAL | 1 refills | Status: DC

## 2022-03-20 MED ORDER — ZOSTER VAC RECOMB ADJUVANTED 50 MCG/0.5ML IM SUSR: 0.5000 mL | Freq: Once | INTRAMUSCULAR | 0 refills | Status: AC
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
## 2022-03-23 ENCOUNTER — Other Ambulatory Visit: Payer: Self-pay | Admitting: Nurse Practitioner

## 2022-03-23 DIAGNOSIS — E782 Mixed hyperlipidemia: Secondary | ICD-10-CM

## 2022-03-23 DIAGNOSIS — I10 Essential (primary) hypertension: Secondary | ICD-10-CM

## 2022-03-23 DIAGNOSIS — E039 Hypothyroidism, unspecified: Secondary | ICD-10-CM

## 2022-03-27 ENCOUNTER — Other Ambulatory Visit: Payer: Self-pay

## 2022-03-27 ENCOUNTER — Telehealth: Payer: Self-pay

## 2022-03-27 DIAGNOSIS — Z86010 Personal history of colonic polyps: Secondary | ICD-10-CM

## 2022-03-27 MED ORDER — NA SULFATE-K SULFATE-MG SULF 17.5-3.13-1.6 GM/177ML PO SOLN: 1.0000 | Freq: Once | ORAL | 0 refills | Status: AC

## 2022-03-27 NOTE — Telephone Encounter (Signed)
Gastroenterology Pre-Procedure Review  Request Date: 04/11/22 Requesting Physician: Dr. Marius Ditch  PATIENT REVIEW QUESTIONS: The patient responded to the following health history questions as indicated:    1. Are you having any GI issues? no 2. Do you have a personal history of Polyps? yes (last colonoscopy 12/22/19) 3. Do you have a family history of Colon Cancer or Polyps? no 4. Diabetes Mellitus? no 5. Joint replacements in the past 12 months?no 6. Major health problems in the past 3 months?no 7. Any artificial heart valves, MVP, or defibrillator?no    MEDICATIONS & ALLERGIES:    Patient reports the following regarding taking any anticoagulation/antiplatelet therapy:   Plavix, Coumadin, Eliquis, Xarelto, Lovenox, Pradaxa, Brilinta, or Effient? no Aspirin? no  Patient confirms/reports the following medications:  Current Outpatient Medications  Medication Sig Dispense Refill   albuterol (VENTOLIN HFA) 108 (90 Base) MCG/ACT inhaler Inhale 2 puffs into the lungs every 6 (six) hours as needed for wheezing or shortness of breath. 1 each 3   amitriptyline (ELAVIL) 50 MG tablet Take 1 tablet (50 mg total) by mouth at bedtime. 30 tablet 0   amLODipine (NORVASC) 10 MG tablet TAKE 1 TABLET (10 MG TOTAL) BY MOUTH DAILY. 28 tablet 10   docusate sodium (COLACE) 100 MG capsule TAKE 1 CAPSULE (100 MG TOTAL) BY MOUTH 2 (TWO) TIMES DAILY. 56 capsule 10   ergocalciferol (VITAMIN D2) 1.25 MG (50000 UT) capsule Take 1 capsule (50,000 Units total) by mouth once a week. 4 capsule 5   folic acid (FOLVITE) 1 MG tablet TAKE 1 TABLET (1 MG TOTAL) BY MOUTH DAILY. 90 tablet 1   INVEGA 9 MG 24 hr tablet Take 9 mg by mouth every morning.     INVEGA SUSTENNA 234 MG/1.5ML injection Inject into the muscle. Every 4 weeks.     ipratropium-albuterol (DUONEB) 0.5-2.5 (3) MG/3ML SOLN Take 3 mLs by nebulization every 6 (six) hours as needed. 360 mL 1   levothyroxine (SYNTHROID) 137 MCG tablet Take 1 tablet (137 mcg total) by  mouth daily before breakfast. 84 tablet 1   levothyroxine (SYNTHROID) 150 MCG tablet TAKE 1 TABLET (150 MCG TOTAL) BY MOUTH DAILY. 28 tablet 10   lisinopril (ZESTRIL) 40 MG tablet TAKE 1 TABLET (40 MG TOTAL) BY MOUTH DAILY. 28 tablet 10   nicotine (NICODERM CQ - DOSED IN MG/24 HOURS) 21 mg/24hr patch PLACE 1 PATCH (21 MG TOTAL) ONTO THE SKIN DAILY. 28 patch 5   pantoprazole (PROTONIX) 40 MG tablet TAKE 1 TABLET (40 MG TOTAL) BY MOUTH DAILY. 28 tablet 4   pravastatin (PRAVACHOL) 10 MG tablet TAKE 1 TABLET (10 MG TOTAL) BY MOUTH DAILY. 28 tablet 10   propranolol (INDERAL) 10 MG tablet TAKE 1 TABLET (10 MG TOTAL) BY MOUTH THREE TIMES DAILY. 84 tablet 4   solifenacin (VESICARE) 10 MG tablet TAKE ONE TABLET BY MOUTH EVERY DAY 28 tablet 2   tamsulosin (FLOMAX) 0.4 MG CAPS capsule TAKE ONE CAPSULE BY MOUTH EVERY DAY 28 capsule 2   traZODone (DESYREL) 100 MG tablet Take 100 mg by mouth at bedtime.     venlafaxine XR (EFFEXOR-XR) 75 MG 24 hr capsule Take 1 capsule (75 mg total) by mouth daily with breakfast. 30 capsule 0   No current facility-administered medications for this visit.    Patient confirms/reports the following allergies:  Allergies  Allergen Reactions   Aspirin Nausea And Vomiting and Swelling    No orders of the defined types were placed in this encounter.   AUTHORIZATION INFORMATION Primary  Insurance: 1D#: Group #:  Secondary Insurance: 1D#: Group #:  SCHEDULE INFORMATION: Date: 04/11/22 Time: Location: [facility]

## 2022-03-28 ENCOUNTER — Telehealth: Payer: Self-pay | Admitting: Pulmonary Disease

## 2022-03-28 ENCOUNTER — Ambulatory Visit: Payer: Medicaid Other | Attending: Pulmonary Disease

## 2022-03-28 DIAGNOSIS — F17210 Nicotine dependence, cigarettes, uncomplicated: Secondary | ICD-10-CM | POA: Insufficient documentation

## 2022-03-28 DIAGNOSIS — R0602 Shortness of breath: Secondary | ICD-10-CM | POA: Insufficient documentation

## 2022-03-28 LAB — PULMONARY FUNCTION TEST ARMC ONLY
DL/VA % pred: 49 %
DL/VA: 2.1 ml/min/mmHg/L
DLCO unc % pred: 37 %
DLCO unc: 9.36 ml/min/mmHg
FEF 25-75 Post: 0.85 L/sec
FEF 25-75 Pre: 0.73 L/sec
FEF2575-%Change-Post: 15 %
FEF2575-%Pred-Post: 32 %
FEF2575-%Pred-Pre: 28 %
FEV1-%Change-Post: 8 %
FEV1-%Pred-Post: 54 %
FEV1-%Pred-Pre: 50 %
FEV1-Post: 1.73 L
FEV1-Pre: 1.59 L
FEV1FVC-%Change-Post: 9 %
FEV1FVC-%Pred-Pre: 71 %
FEV6-%Change-Post: -2 %
FEV6-%Pred-Post: 72 %
FEV6-%Pred-Pre: 74 %
FEV6-Post: 2.9 L
FEV6-Pre: 2.97 L
FEV6FVC-%Change-Post: 0 %
FEV6FVC-%Pred-Post: 104 %
FEV6FVC-%Pred-Pre: 105 %
FVC-%Change-Post: 0 %
FVC-%Pred-Post: 70 %
FVC-%Pred-Pre: 70 %
FVC-Post: 2.95 L
FVC-Pre: 2.97 L
Post FEV1/FVC ratio: 59 %
Post FEV6/FVC ratio: 99 %
Pre FEV1/FVC ratio: 54 %
Pre FEV6/FVC Ratio: 100 %
RV % pred: 128 %
RV: 2.71 L
TLC % pred: 88 %
TLC: 5.63 L
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
## 2022-03-28 NOTE — Telephone Encounter (Signed)
He is to have PFT today 1300 hrs.  I can make a better assessment once I see that test.

## 2022-03-30 NOTE — Telephone Encounter (Signed)
Dr. Gonzalez, please advise. thanks 

## 2022-03-30 NOTE — Telephone Encounter (Signed)
April, do you have surgical form? Will you fax to [facility] office [phone].

## 2022-03-30 NOTE — Telephone Encounter (Signed)
Do they have a form they want me to fill out?  He has severe COPD but he is as compensated as he is to get.  I have only seen him once.

## 2022-03-31 ENCOUNTER — Encounter: Payer: Self-pay | Admitting: Pulmonary Disease

## 2022-03-31 NOTE — Telephone Encounter (Signed)
The form has been placed in your folder for review.

## 2022-03-31 NOTE — Telephone Encounter (Signed)
Patient has only been seen 1 time by me on 01/31/2022, this was for an abnormal chest CT.  He has a follow-up appointment with me on 12 number which is Tuesday.  He has had PFTs, we will evaluate him fully at the time of follow-up.  I will be better able make a risk assessment at that time.

## 2022-03-31 NOTE — Telephone Encounter (Signed)
April, I did not receive form. Will you re fax? Thank you.

## 2022-03-31 NOTE — Progress Notes (Signed)
A preoperative risk assessment was requested for Christopher Mason.  He has been seen 1 time at [facility] on 31 January 2022 for evaluation of an abnormal CT chest.  He has a follow-up appointment with [place] on 12 December and a full assessment of preoperative risk will be assessed at that point.  Renold Don, MD Advanced Bronchoscopy PCCM [facility]

## 2022-04-04 ENCOUNTER — Encounter: Payer: Self-pay | Admitting: Pulmonary Disease

## 2022-04-04 ENCOUNTER — Ambulatory Visit (INDEPENDENT_AMBULATORY_CARE_PROVIDER_SITE_OTHER): Payer: Medicaid Other | Admitting: Pulmonary Disease

## 2022-04-04 VITALS — BP 122/82 | HR 68 | Temp 97.7°F | Ht 67.0 in | Wt 210.4 lb

## 2022-04-04 DIAGNOSIS — Z01811 Encounter for preprocedural respiratory examination: Secondary | ICD-10-CM | POA: Diagnosis not present

## 2022-04-04 DIAGNOSIS — G2401 Drug induced subacute dyskinesia: Secondary | ICD-10-CM | POA: Diagnosis not present

## 2022-04-04 DIAGNOSIS — J449 Chronic obstructive pulmonary disease, unspecified: Secondary | ICD-10-CM | POA: Diagnosis not present

## 2022-04-04 NOTE — Progress Notes (Signed)
Subjective:    Patient ID: Christopher Mason, male    DOB: Jul 08, 1960, 61 y.o.   MRN: [id number] Patient Care Team: Jonetta Osgood, NP as PCP - General (Nurse Practitioner) Tyler Pita, MD as Consulting Physician (Pulmonary Disease)  Chief Complaint  Patient presents with   Follow-up    SOB with exertion. Wheezing. No cough.   HPI This is a 61 year old current smoker (both cigarettes/marijuana) with a history as noted below who presents today for follow-up on the issue of shortness of breath and COPD.  Initially seen here on 31 January 2022 for abnormal low-dose CT scan for lung cancer screening.  Subsequent testing showed that this was inflammatory process.  Since his initial visit the patient has had pulmonary function testing that showed that he has moderate to severe COPD.  He has "constant shortness of breath".  At his prior visit he stated that he was using DuoNeb 4 times a day but today he does admit that he only uses it perhaps once a day.  The nebulizer does give him relief.  He continues to smoke states that he is down to 3 to 4 cigarettes/day from 1 pack a day.  He also smokes marijuana daily.  The patient is to have scar tissue revision under general anesthesia and a request for preoperative pulmonary evaluation is made.  Patient has not had any recent exacerbations.  No visits to the ED or urgent care.  He does have issues with tar dive dyskinesia and the nebulizer is the easier medication to use with regards to his respiratory status.  He has not had any fevers, chills or sweats.  No hemoptysis.  He has cough mostly in the morning productive of clear sputum.  He has not had any other symptomatology.   DATA 01/03/2022 LDCT chest: Scarring in the right middle lobe and both lower lobes calcified granulomas.  Right lower lobe distortion/scarring cannot exclude lung lesion. 01/26/2022 PET/CT: No hypermetabolism in the chest (lung or mediastinum) resume yearly lung cancer  screening. 03/28/2022 PFTs: FEV1 1.59 L or 50% predicted, FVC 2.97 L or 70% predicted, FEV1/FVC 54% of predicted, no bronchodilator response, patient did not perform diffusion capacity maneuvers well so these are not valid.  Consistent with with moderate to severe obstructive defect.  Review of Systems A 10 point review of systems was performed and it is as noted above otherwise negative.  Patient Active Problem List   Diagnosis Date Noted   Genetic testing 01/12/2020   History of colon polyps    Encounter for screening colonoscopy    Erectile dysfunction due to arterial insufficiency 12/09/2018   COPD exacerbation ([place]) 07/11/2018   Abdominal pain, left lower quadrant 10/31/2017   Acute cystitis without hematuria 10/31/2017   Other fatigue 10/31/2017   Cigarette nicotine dependence without complication 94/58/5929   Acute right ankle pain 10/10/2017   Acute pain of right knee 10/10/2017   Lower extremity edema 10/10/2017   Recurrent left inguinal hernia 10/10/2017   HLD (hyperlipidemia) 02/07/2015   COPD (chronic obstructive pulmonary disease) ([place]) 02/07/2015   Hypothyroidism 09/17/2014   GERD (gastroesophageal reflux disease) 09/17/2014   Tobacco use disorder 09/17/2014   Asthma 09/16/2014   HTN (hypertension) 09/16/2014   Tardive dyskinesia 09/16/2014   Social History   Tobacco Use   Smoking status: Every Day    Packs/day: 1.00    Years: 40.00    Total pack years: 40.00    Types: Cigarettes   Smokeless tobacco: Never   Tobacco  comments:    1 PPD 01/31/2022    3-4 cigarettes daily 04/04/2022  Substance Use Topics   Alcohol use: No   Allergies  Allergen Reactions   Aspirin Nausea And Vomiting and Swelling   Current Meds  Medication Sig   albuterol (VENTOLIN HFA) 108 (90 Base) MCG/ACT inhaler Inhale 2 puffs into the lungs every 6 (six) hours as needed for wheezing or shortness of breath.   amitriptyline (ELAVIL) 50 MG tablet Take 1 tablet (50 mg total) by mouth at  bedtime.   amLODipine (NORVASC) 10 MG tablet TAKE 1 TABLET (10 MG TOTAL) BY MOUTH DAILY.   docusate sodium (COLACE) 100 MG capsule TAKE 1 CAPSULE (100 MG TOTAL) BY MOUTH 2 (TWO) TIMES DAILY.   ergocalciferol (VITAMIN D2) 1.25 MG (50000 UT) capsule Take 1 capsule (50,000 Units total) by mouth once a week.   folic acid (FOLVITE) 1 MG tablet TAKE 1 TABLET (1 MG TOTAL) BY MOUTH DAILY.   INVEGA 9 MG 24 hr tablet Take 9 mg by mouth every morning.   INVEGA SUSTENNA 234 MG/1.5ML injection Inject into the muscle. Every 4 weeks.   ipratropium-albuterol (DUONEB) 0.5-2.5 (3) MG/3ML SOLN Take 3 mLs by nebulization every 6 (six) hours as needed.   levothyroxine (SYNTHROID) 137 MCG tablet Take 1 tablet (137 mcg total) by mouth daily before breakfast.   levothyroxine (SYNTHROID) 150 MCG tablet TAKE 1 TABLET (150 MCG TOTAL) BY MOUTH DAILY.   lisinopril (ZESTRIL) 40 MG tablet TAKE 1 TABLET (40 MG TOTAL) BY MOUTH DAILY.   nicotine (NICODERM CQ - DOSED IN MG/24 HOURS) 21 mg/24hr patch PLACE 1 PATCH (21 MG TOTAL) ONTO THE SKIN DAILY.   pantoprazole (PROTONIX) 40 MG tablet TAKE 1 TABLET (40 MG TOTAL) BY MOUTH DAILY.   pravastatin (PRAVACHOL) 10 MG tablet TAKE 1 TABLET (10 MG TOTAL) BY MOUTH DAILY.   propranolol (INDERAL) 10 MG tablet TAKE 1 TABLET (10 MG TOTAL) BY MOUTH THREE TIMES DAILY.   solifenacin (VESICARE) 10 MG tablet TAKE ONE TABLET BY MOUTH EVERY DAY   tamsulosin (FLOMAX) 0.4 MG CAPS capsule TAKE ONE CAPSULE BY MOUTH EVERY DAY   traZODone (DESYREL) 100 MG tablet Take 100 mg by mouth at bedtime.   venlafaxine XR (EFFEXOR-XR) 75 MG 24 hr capsule Take 1 capsule (75 mg total) by mouth daily with breakfast.   Immunization History  Administered Date(s) Administered   Influenza Inj Mdck Quad Pf 01/07/2018, 03/02/2020, 03/01/2022   Influenza,inj,Quad PF,6+ Mos 01/02/2019   Moderna Sars-Covid-2 Vaccination 08/26/2019, 09/17/2019, 02/17/2020       Objective:   Physical Exam BP 122/82 (BP Location: Left Arm,  Cuff Size: Large)   Pulse 68   Temp 97.7 F (36.5 C)   Ht '5\' 7"'$  (1.702 m)   Wt 210 lb 6.4 oz (95.4 kg)   SpO2 95%   BMI 32.95 kg/m  GENERAL: Somewhat disheveled looking gentleman, no acute distress.  Fully ambulatory today, gait instability due to leg brace.  No conversational dyspnea. HEAD: Normocephalic, atraumatic.  EYES: Pupils equal, round, reactive to light.  No scleral icterus.  MOUTH: Macroglossia, lingual dyskinesia. NECK: Supple. No thyromegaly. Trachea midline. No JVD.  No adenopathy. PULMONARY: Good air entry bilaterally.  Scattered rhonchi bilaterally, no wheezes. CARDIOVASCULAR: S1 and S2. Regular rate and rhythm.  ABDOMEN: Benign. MUSCULOSKELETAL: Left arm limited motion due to prior scar tissue (burn), no clubbing, no edema.  There is a brace on the left lower extremity.  Decreased muscle mass on the left lower extremity due to  prior poliomyelitis  NEUROLOGIC: Patient exhibits lingual tardive dyskinesia, dysarthria due to the same. SKIN: Intact,warm,dry. PSYCH: Normal mood.  Normal behavior.    Assessment & Plan:     ICD-10-CM   1. COPD, moderate - severe  J44.9    DuoNeb at least 3 times a day Continue albuterol as rescue    2. Tardive dyskinesia  G24.01    Tardive/lingual dyskinesia This issue adds complexity to his management    3. Preoperative respiratory examination  Z01.811    The patient appears to be fairly well compensated No further optimization possible Moderate operative risk     Patient is fairly well compensated at present.  Increasing his nebulizer treatments to at least 3 times a day should help optimize further.  After this no further intervention is going to optimize his baseline status.  He has moderate to severe COPD.  He is a moderate risk for the proposed procedure under general anesthesia.  Issues that increased risk are not only COPD but also thyroiditis/lingual dyskinesia which may cause upper airway obstruction  postanesthesia.  Recommend nebulization treatment with DuoNeb prior to induction and maintaining a every 6 hour schedule of administration postanesthesia.  We will see the patient in follow-up in 3 months time he is to call sooner should any new problems arise.   Renold Don, MD Advanced Bronchoscopy PCCM [facility]    *This note was dictated using voice recognition software/Dragon.  Despite best efforts to proofread, errors can occur which can change the meaning. Any transcriptional errors that result from this process are unintentional and may not be fully corrected at the time of dictation.

## 2022-04-04 NOTE — Telephone Encounter (Signed)
Clearance has been faxed.  

## 2022-04-04 NOTE — Patient Instructions (Signed)
You need to use your nebulizer at least 3 times a day.  You are going to be a moderate to high risk for the surgery and this is something that we cannot improve upon further this point.  Please work on quitting smoking.  We will see you in follow-up in 3 months time call sooner should any new problems arise.

## 2022-04-05 ENCOUNTER — Ambulatory Visit: Payer: Medicaid Other | Admitting: Cardiovascular Disease

## 2022-04-11 ENCOUNTER — Ambulatory Visit: Payer: Medicaid Other | Admitting: Anesthesiology

## 2022-04-11 ENCOUNTER — Ambulatory Visit
Admission: RE | Admit: 2022-04-11 | Discharge: 2022-04-11 | Disposition: A | Discharge status: Home / Self Care | Payer: Medicaid Other | Attending: Gastroenterology | Admitting: Gastroenterology

## 2022-04-11 ENCOUNTER — Encounter
Admission: RE | Disposition: A | Discharge status: Home / Self Care | Payer: Self-pay | Source: Home / Self Care | Attending: Gastroenterology

## 2022-04-11 DIAGNOSIS — D125 Benign neoplasm of sigmoid colon: Secondary | ICD-10-CM | POA: Diagnosis not present

## 2022-04-11 DIAGNOSIS — E785 Hyperlipidemia, unspecified: Secondary | ICD-10-CM | POA: Insufficient documentation

## 2022-04-11 DIAGNOSIS — D123 Benign neoplasm of transverse colon: Secondary | ICD-10-CM | POA: Diagnosis not present

## 2022-04-11 DIAGNOSIS — K589 Irritable bowel syndrome without diarrhea: Secondary | ICD-10-CM | POA: Insufficient documentation

## 2022-04-11 DIAGNOSIS — K219 Gastro-esophageal reflux disease without esophagitis: Secondary | ICD-10-CM | POA: Insufficient documentation

## 2022-04-11 DIAGNOSIS — Z86010 Personal history of colonic polyps: Secondary | ICD-10-CM | POA: Insufficient documentation

## 2022-04-11 DIAGNOSIS — F209 Schizophrenia, unspecified: Secondary | ICD-10-CM | POA: Diagnosis not present

## 2022-04-11 DIAGNOSIS — Z1211 Encounter for screening for malignant neoplasm of colon: Secondary | ICD-10-CM | POA: Diagnosis not present

## 2022-04-11 DIAGNOSIS — F17210 Nicotine dependence, cigarettes, uncomplicated: Secondary | ICD-10-CM | POA: Insufficient documentation

## 2022-04-11 DIAGNOSIS — F319 Bipolar disorder, unspecified: Secondary | ICD-10-CM | POA: Insufficient documentation

## 2022-04-11 DIAGNOSIS — I10 Essential (primary) hypertension: Secondary | ICD-10-CM | POA: Insufficient documentation

## 2022-04-11 DIAGNOSIS — Z9049 Acquired absence of other specified parts of digestive tract: Secondary | ICD-10-CM | POA: Diagnosis not present

## 2022-04-11 DIAGNOSIS — D126 Benign neoplasm of colon, unspecified: Secondary | ICD-10-CM

## 2022-04-11 DIAGNOSIS — D124 Benign neoplasm of descending colon: Secondary | ICD-10-CM | POA: Diagnosis not present

## 2022-04-11 DIAGNOSIS — D122 Benign neoplasm of ascending colon: Secondary | ICD-10-CM | POA: Insufficient documentation

## 2022-04-11 DIAGNOSIS — E039 Hypothyroidism, unspecified: Secondary | ICD-10-CM | POA: Diagnosis not present

## 2022-04-11 HISTORY — DX: Benign neoplasm of colon, unspecified: D12.6

## 2022-04-11 HISTORY — PX: COLONOSCOPY WITH PROPOFOL: SHX5780

## 2022-04-11 SURGERY — COLONOSCOPY WITH PROPOFOL
Anesthesia: General

## 2022-04-11 MED ORDER — PROPOFOL 1000 MG/100ML IV EMUL
INTRAVENOUS | Status: AC
  Filled 2022-04-11: qty 100

## 2022-04-11 MED ORDER — DEXMEDETOMIDINE HCL IN NACL 80 MCG/20ML IV SOLN
INTRAVENOUS | Status: AC
  Filled 2022-04-11: qty 20

## 2022-04-11 MED ORDER — EPHEDRINE 5 MG/ML INJ
INTRAVENOUS | Status: AC
  Filled 2022-04-11: qty 5

## 2022-04-11 MED ORDER — PROPOFOL 10 MG/ML IV BOLUS
INTRAVENOUS | Status: DC | PRN
  Administered 2022-04-11: 70 mg via INTRAVENOUS

## 2022-04-11 MED ORDER — IPRATROPIUM-ALBUTEROL 0.5-2.5 (3) MG/3ML IN SOLN
3.0000 mL | Freq: Once | RESPIRATORY_TRACT | Status: AC
  Administered 2022-04-11: 3 mL via RESPIRATORY_TRACT

## 2022-04-11 MED ORDER — PROPOFOL 500 MG/50ML IV EMUL
INTRAVENOUS | Status: DC | PRN
  Administered 2022-04-11: 80 ug/kg/min via INTRAVENOUS

## 2022-04-11 MED ORDER — LIDOCAINE HCL (CARDIAC) PF 100 MG/5ML IV SOSY
PREFILLED_SYRINGE | INTRAVENOUS | Status: DC | PRN
  Administered 2022-04-11: 50 mg via INTRAVENOUS

## 2022-04-11 MED ORDER — EPHEDRINE SULFATE (PRESSORS) 50 MG/ML IJ SOLN
INTRAMUSCULAR | Status: DC | PRN
  Administered 2022-04-11: 5 mg via INTRAVENOUS
  Administered 2022-04-11: 10 mg via INTRAVENOUS

## 2022-04-11 MED ORDER — LIDOCAINE HCL (PF) 2 % IJ SOLN
INTRAMUSCULAR | Status: AC
  Filled 2022-04-11: qty 5

## 2022-04-11 MED ORDER — PHENYLEPHRINE 80 MCG/ML (10ML) SYRINGE FOR IV PUSH (FOR BLOOD PRESSURE SUPPORT)
PREFILLED_SYRINGE | INTRAVENOUS | Status: AC
  Filled 2022-04-11: qty 10

## 2022-04-11 MED ORDER — PROPOFOL 10 MG/ML IV BOLUS
INTRAVENOUS | Status: AC
  Filled 2022-04-11: qty 20

## 2022-04-11 MED ORDER — SODIUM CHLORIDE 0.9 % IV SOLN: INTRAVENOUS | Status: DC

## 2022-04-11 NOTE — Transfer of Care (Signed)
Immediate Anesthesia Transfer of Care Note  Patient: Christopher Mason  Procedure(s) Performed: COLONOSCOPY WITH PROPOFOL  Patient Location: PACU and Endoscopy Unit  Anesthesia Type:General  Level of Consciousness: drowsy and patient cooperative  Airway & Oxygen Therapy: Patient Spontanous Breathing and Patient connected to nasal cannula oxygen  Post-op Assessment: Report given to RN and Post -op Vital signs reviewed and stable  Post vital signs: Reviewed and stable  Last Vitals:  Vitals Value Taken Time  BP 89/64 04/11/22 0901  Temp 36.4 C 04/11/22 0900  Pulse 69 04/11/22 0902  Resp 21 04/11/22 0902  SpO2 99 % 04/11/22 0902  Vitals shown include unvalidated device data.  Last Pain:  Vitals:   04/11/22 0900  TempSrc: Temporal  PainSc:          Complications: No notable events documented.

## 2022-04-11 NOTE — Op Note (Signed)
[facility] Gastroenterology Patient Name: Christopher Mason Procedure Date: 04/11/2022 7:08 AM MRN: [id number] Account #: [id number] Date of Birth: 04-Apr-1961 Admit Type: Outpatient Age: 61 Room: [facility] ENDO ROOM 3 Gender: Male Note Status: Finalized Instrument Name: Jasper Riling [id number] Procedure:             Colonoscopy Indications:           High risk colon cancer surveillance: Personal history                         of colonic polyps, Surveillance: History of numerous                         (> 10) adenomas on last colonoscopy (< 3 yrs), Last                         colonoscopy: August 2021 Providers:             Lin Landsman MD, MD Referring MD:          Jonetta Osgood (Referring MD) Medicines:             General Anesthesia Complications:         No immediate complications. Estimated blood loss: None. Procedure:             Pre-Anesthesia Assessment:                        - Prior to the procedure, a History and Physical was                         performed, and patient medications and allergies were                         reviewed. The patient is competent. The risks and                         benefits of the procedure and the sedation options and                         risks were discussed with the patient. All questions                         were answered and informed consent was obtained.                         Patient identification and proposed procedure were                         verified by the physician, the nurse, the                         anesthesiologist, the anesthetist and the technician                         in the pre-procedure area in the procedure room in the                         endoscopy suite. Mental Status Examination: alert and  oriented. Airway Examination: normal oropharyngeal                         airway and neck mobility. Respiratory Examination:                         clear to  auscultation. CV Examination: normal.                         Prophylactic Antibiotics: The patient does not require                         prophylactic antibiotics. Prior Anticoagulants: The                         patient has taken no anticoagulant or antiplatelet                         agents. ASA Grade Assessment: III - A patient with                         severe systemic disease. After reviewing the risks and                         benefits, the patient was deemed in satisfactory                         condition to undergo the procedure. The anesthesia                         plan was to use general anesthesia. Immediately prior                         to administration of medications, the patient was                         re-assessed for adequacy to receive sedatives. The                         heart rate, respiratory rate, oxygen saturations,                         blood pressure, adequacy of pulmonary ventilation, and                         response to care were monitored throughout the                         procedure. The physical status of the patient was                         re-assessed after the procedure.                        After obtaining informed consent, the colonoscope was                         passed under direct vision. Throughout the procedure,  the patient's blood pressure, pulse, and oxygen                         saturations were monitored continuously. The                         Colonoscope was introduced through the anus and                         advanced to the the terminal ileum, with                         identification of the appendiceal orifice and IC                         valve. The colonoscopy was performed with moderate                         difficulty. The patient tolerated the procedure well.                         The quality of the bowel preparation was adequate to                         identify  polyps greater than 5 mm in size. Findings:      The perianal and digital rectal examinations were normal. Pertinent       negatives include normal sphincter tone and no palpable rectal lesions.      The terminal ileum appeared normal.      Two sessile polyps were found in the ascending colon. The polyps were 4       to 5 mm in size. These polyps were removed with a cold snare. Resection       and retrieval were complete. Estimated blood loss: none.      Five sessile polyps were found in the transverse colon. The polyps were       4 to 6 mm in size. These polyps were removed with a cold snare.       Resection and retrieval were complete. Estimated blood loss: none.      Four sessile polyps were found in the descending colon. The polyps were       4 to 5 mm in size. These polyps were removed with a cold snare.       Resection and retrieval were complete. Estimated blood loss: none.      Six sessile polyps were found in the sigmoid colon. The polyps were 4 to       6 mm in size. These polyps were removed with a cold snare. Resection and       retrieval were complete. Estimated blood loss: none. Impression:            - The examined portion of the ileum was normal.                        - Two 4 to 5 mm polyps in the ascending colon, removed                         with a cold snare. Resected and retrieved.                        -  Five 4 to 6 mm polyps in the transverse colon,                         removed with a cold snare. Resected and retrieved.                        - Four 4 to 5 mm polyps in the descending colon,                         removed with a cold snare. Resected and retrieved.                        - Six 4 to 6 mm polyps in the sigmoid colon, removed                         with a cold snare. Resected and retrieved. Recommendation:        - Discharge patient to home (with escort).                        - Resume previous diet today.                        - Continue  present medications.                        - Await pathology results.                        - Repeat colonoscopy in 1 year for surveillance of                         multiple polyps. Procedure Code(s):     --- Professional ---                        [phone], Colonoscopy, flexible; with removal of                         tumor(s), polyp(s), or other lesion(s) by snare                         technique Diagnosis Code(s):     --- Professional ---                        D12.2, Benign neoplasm of ascending colon                        D12.3, Benign neoplasm of transverse colon (hepatic                         flexure or splenic flexure)                        D12.4, Benign neoplasm of descending colon                        D12.5, Benign neoplasm of sigmoid colon  Z86.010, Personal history of colonic polyps CPT copyright 2022 [employer]. All rights reserved. The codes documented in this report are preliminary and upon coder review may  be revised to meet current compliance requirements. Dr. Ulyess Mort Lin Landsman MD, MD 04/11/2022 8:59:33 AM This report has been signed electronically. Number of Addenda: 0 Note Initiated On: 04/11/2022 7:08 AM Scope Withdrawal Time: 0 hours 50 minutes 25 seconds  Total Procedure Duration: 0 hours 59 minutes 17 seconds  Estimated Blood Loss:  Estimated blood loss: none.      [facility]

## 2022-04-11 NOTE — Anesthesia Postprocedure Evaluation (Signed)
Anesthesia Post Note  Patient: Nafis Farnan  Procedure(s) Performed: COLONOSCOPY WITH PROPOFOL  Patient location during evaluation: Endoscopy Anesthesia Type: General Level of consciousness: awake and alert Pain management: pain level controlled Vital Signs Assessment: post-procedure vital signs reviewed and stable Respiratory status: spontaneous breathing, nonlabored ventilation and respiratory function stable Cardiovascular status: blood pressure returned to baseline and stable Postop Assessment: no apparent nausea or vomiting Anesthetic complications: no   No notable events documented.   Last Vitals:  Vitals:   04/11/22 0910 04/11/22 0930  BP: 137/86 117/69  Pulse:    Resp:    Temp:    SpO2:      Last Pain:  Vitals:   04/11/22 0930  TempSrc:   PainSc: 0-No pain                 Alphonsus Sias
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
## 2022-04-12 ENCOUNTER — Encounter: Payer: Self-pay | Admitting: Gastroenterology

## 2022-04-12 LAB — SURGICAL PATHOLOGY

## 2022-04-13 ENCOUNTER — Telehealth: Payer: Self-pay

## 2022-04-13 DIAGNOSIS — Z86010 Personal history of colonic polyps: Secondary | ICD-10-CM

## 2022-04-13 NOTE — Telephone Encounter (Signed)
Placed referral to genetic counseling

## 2022-04-13 NOTE — Telephone Encounter (Signed)
-----   Message from Lin Landsman, MD sent at 04/12/2022  4:45 PM EST ----- Recommend referral to genetic counselor Dx: More than 10 tubular adenomas of the colon  RV
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
## 2022-04-14 ENCOUNTER — Encounter: Payer: Self-pay | Admitting: Cardiovascular Disease

## 2022-04-14 ENCOUNTER — Ambulatory Visit: Payer: Medicaid Other | Attending: Cardiovascular Disease | Admitting: Cardiovascular Disease

## 2022-04-14 VITALS — BP 108/70 | HR 71 | Ht 67.0 in | Wt 210.6 lb

## 2022-04-14 DIAGNOSIS — Z01810 Encounter for preprocedural cardiovascular examination: Secondary | ICD-10-CM

## 2022-04-14 DIAGNOSIS — E782 Mixed hyperlipidemia: Secondary | ICD-10-CM | POA: Diagnosis not present

## 2022-04-14 DIAGNOSIS — I2510 Atherosclerotic heart disease of native coronary artery without angina pectoris: Secondary | ICD-10-CM | POA: Diagnosis not present

## 2022-04-14 MED ORDER — ATORVASTATIN CALCIUM 20 MG PO TABS: 20.0000 mg | ORAL_TABLET | Freq: Every day | ORAL | 3 refills | Status: DC

## 2022-04-14 NOTE — Telephone Encounter (Signed)
Spoke with patient on 12/22 '@3'$ :30 pm. He was given detailed instructions about his STRESS TEST on 04/21/22, He was also asked to arrive 15 minutes earlier than his scheduled visit.

## 2022-04-14 NOTE — Addendum Note (Signed)
Addended by: Geralynn Rile on: 04/14/2022 09:52 AM   Modules accepted: Orders

## 2022-04-14 NOTE — Patient Instructions (Signed)
Medication Instructions:  STOP PRAVASTATIN  START: ATORVASTATIN '20mg'$  ONCE DAILY  *If you need a refill on your cardiac medications before your next appointment, please call your pharmacy*  Lab Work: None Ordered At This Time.  If you have labs (blood work) drawn today and your tests are completely normal, you will receive your results only by: [place] (if you have MyChart) OR A paper copy in the mail If you have any lab test that is abnormal or we need to change your treatment, we will call you to review the results.  Testing/Procedures: Your physician has requested that you have a lexiscan myoview. For further information please visit [email or web address]. Please follow instruction sheet, as given.  Follow-Up: At [facility], you and your health needs are our priority.  As part of our continuing mission to provide you with exceptional heart care, we have created designated Provider Care Teams.  These Care Teams include your primary Cardiologist (physician) and Advanced Practice Providers (APPs -  Physician Assistants and Nurse Practitioners) who all work together to provide you with the care you need, when you need it.  Your next appointment:   1 year(s)  The format for your next appointment:   In Person  Provider:   APP

## 2022-04-17 ENCOUNTER — Encounter: Payer: Self-pay | Admitting: Nurse Practitioner

## 2022-04-19 ENCOUNTER — Encounter: Payer: Self-pay | Admitting: Plastic Surgery

## 2022-04-19 ENCOUNTER — Ambulatory Visit (INDEPENDENT_AMBULATORY_CARE_PROVIDER_SITE_OTHER): Payer: Medicaid Other | Admitting: Plastic Surgery

## 2022-04-19 VITALS — BP 138/83 | HR 88

## 2022-04-19 DIAGNOSIS — F17210 Nicotine dependence, cigarettes, uncomplicated: Secondary | ICD-10-CM

## 2022-04-19 DIAGNOSIS — L905 Scar conditions and fibrosis of skin: Secondary | ICD-10-CM
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
## 2022-04-21 ENCOUNTER — Encounter (HOSPITAL_COMMUNITY): Payer: Medicaid Other

## 2022-04-24 ENCOUNTER — Other Ambulatory Visit: Payer: Self-pay | Admitting: Nurse Practitioner

## 2022-04-24 DIAGNOSIS — E559 Vitamin D deficiency, unspecified: Secondary | ICD-10-CM

## 2022-04-25 ENCOUNTER — Other Ambulatory Visit: Payer: Self-pay

## 2022-04-25 MED ORDER — PROPRANOLOL HCL 10 MG PO TABS: 10.0000 mg | ORAL_TABLET | Freq: Three times a day (TID) | ORAL | 1 refills | Status: DC

## 2022-04-28 ENCOUNTER — Telehealth (HOSPITAL_COMMUNITY): Payer: Self-pay | Admitting: *Deleted

## 2022-04-28 NOTE — Telephone Encounter (Signed)
Spoke with patient and gave him detailed instructions about his Stress Test scheduled for 05/05/22 at 10:45 am.

## 2022-05-01 ENCOUNTER — Other Ambulatory Visit: Payer: Self-pay

## 2022-05-01 DIAGNOSIS — J449 Chronic obstructive pulmonary disease, unspecified: Secondary | ICD-10-CM

## 2022-05-01 MED ORDER — IPRATROPIUM-ALBUTEROL 0.5-2.5 (3) MG/3ML IN SOLN: 3.0000 mL | Freq: Four times a day (QID) | RESPIRATORY_TRACT | 1 refills | Status: DC | PRN

## 2022-05-02 LAB — TSH+FREE T4
Free T4: 1.56 ng/dL (ref 0.82–1.77)
TSH: 0.755 u[IU]/mL (ref 0.450–4.500)

## 2022-05-05 ENCOUNTER — Ambulatory Visit (HOSPITAL_COMMUNITY): Payer: Medicaid Other | Attending: Cardiovascular Disease

## 2022-05-05 DIAGNOSIS — E782 Mixed hyperlipidemia: Secondary | ICD-10-CM

## 2022-05-05 DIAGNOSIS — I2510 Atherosclerotic heart disease of native coronary artery without angina pectoris: Secondary | ICD-10-CM | POA: Diagnosis not present

## 2022-05-05 DIAGNOSIS — Z01810 Encounter for preprocedural cardiovascular examination: Secondary | ICD-10-CM | POA: Diagnosis not present

## 2022-05-05 LAB — MYOCARDIAL PERFUSION IMAGING
LV dias vol: 85 mL (ref 62–150)
LV sys vol: 41 mL
Nuc Stress EF: 60 %
Peak HR: 88 {beats}/min
Rest HR: 64 {beats}/min
Rest Nuclear Isotope Dose: 9.6 mCi
SDS: 3
SRS: 0
SSS: 3
ST Depression (mm): 0 mm
Stress Nuclear Isotope Dose: 32.4 mCi
TID: 1.02

## 2022-05-05 MED ORDER — AMINOPHYLLINE 25 MG/ML IV SOLN
75.0000 mg | Freq: Once | INTRAVENOUS | Status: AC
  Administered 2022-05-05: 75 mg via INTRAVENOUS

## 2022-05-05 MED ORDER — TECHNETIUM TC 99M TETROFOSMIN IV KIT
9.7000 | PACK | Freq: Once | INTRAVENOUS | Status: AC | PRN
  Administered 2022-05-05: 9.7 via INTRAVENOUS

## 2022-05-05 MED ORDER — TECHNETIUM TC 99M TETROFOSMIN IV KIT
32.6000 | PACK | Freq: Once | INTRAVENOUS | Status: AC | PRN
  Administered 2022-05-05: 32.6 via INTRAVENOUS

## 2022-05-05 MED ORDER — REGADENOSON 0.4 MG/5ML IV SOLN
0.4000 mg | Freq: Once | INTRAVENOUS | Status: AC
  Administered 2022-05-05: 0.4 mg via INTRAVENOUS

## 2022-05-08 ENCOUNTER — Telehealth: Payer: Self-pay

## 2022-05-08 NOTE — Telephone Encounter (Signed)
-----  Message from Jonetta Osgood, NP sent at 05/08/2022  6:44 AM EST ----- Please call patient and ask him which levothyroxine dose he is taking -- I decreased his dose to 137 mcg daily and it was accidentally refilled at 150 mcg daily.   Thyroid levels are normal. We will continue whichever dose he has been taking since November.

## 2022-05-08 NOTE — Progress Notes (Signed)
Please call patient and ask him which levothyroxine dose he is taking -- I decreased his dose to 137 mcg daily and it was accidentally refilled at 150 mcg daily.   Thyroid levels are normal. We will continue whichever dose he has been taking since November.

## 2022-05-08 NOTE — Telephone Encounter (Signed)
Spoke with patient regarding lab results and patient confirmed that he is taking 119mg of Levothyroxine and will continue this dose.

## 2022-05-24 ENCOUNTER — Encounter: Payer: Medicaid Other | Admitting: Licensed Clinical Social Worker

## 2022-05-24 ENCOUNTER — Other Ambulatory Visit: Payer: Medicaid Other

## 2022-06-12 ENCOUNTER — Encounter: Payer: Self-pay | Admitting: Licensed Clinical Social Worker

## 2022-06-13 ENCOUNTER — Other Ambulatory Visit: Payer: Self-pay | Admitting: Nurse Practitioner

## 2022-06-13 DIAGNOSIS — J449 Chronic obstructive pulmonary disease, unspecified: Secondary | ICD-10-CM

## 2022-06-19 ENCOUNTER — Encounter: Payer: Self-pay | Admitting: Nurse Practitioner

## 2022-06-19 ENCOUNTER — Ambulatory Visit: Payer: Medicaid Other | Admitting: Nurse Practitioner

## 2022-06-19 ENCOUNTER — Telehealth: Payer: Self-pay | Admitting: Nurse Practitioner

## 2022-06-19 VITALS — BP 123/85 | HR 75 | Temp 98.3°F | Resp 16 | Ht 67.0 in | Wt 216.8 lb

## 2022-06-19 DIAGNOSIS — E039 Hypothyroidism, unspecified: Secondary | ICD-10-CM | POA: Diagnosis not present

## 2022-06-19 DIAGNOSIS — H9191 Unspecified hearing loss, right ear: Secondary | ICD-10-CM

## 2022-06-19 DIAGNOSIS — F17210 Nicotine dependence, cigarettes, uncomplicated: Secondary | ICD-10-CM

## 2022-06-19 DIAGNOSIS — J449 Chronic obstructive pulmonary disease, unspecified: Secondary | ICD-10-CM

## 2022-06-19 NOTE — Telephone Encounter (Signed)
Awaiting 06/19/22 office notes for Otolaryngology referral-[facility]
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
## 2022-06-22 ENCOUNTER — Telehealth: Payer: Self-pay | Admitting: Nurse Practitioner

## 2022-06-22 NOTE — Telephone Encounter (Signed)
Otolaryngology referral sent via [facility] to [facility]

## 2022-06-23 ENCOUNTER — Telehealth: Payer: Self-pay | Admitting: Nurse Practitioner

## 2022-06-23 NOTE — Telephone Encounter (Signed)
Otolaryngology appointment>> 07/05/22 with [facility]

## 2022-07-04 ENCOUNTER — Encounter: Payer: Self-pay | Admitting: Pulmonary Disease

## 2022-07-04 ENCOUNTER — Ambulatory Visit (INDEPENDENT_AMBULATORY_CARE_PROVIDER_SITE_OTHER): Payer: Medicaid Other | Admitting: Pulmonary Disease

## 2022-07-04 VITALS — BP 130/86 | HR 73 | Temp 98.0°F | Ht 67.0 in | Wt 222.2 lb

## 2022-07-04 DIAGNOSIS — J449 Chronic obstructive pulmonary disease, unspecified: Secondary | ICD-10-CM | POA: Diagnosis not present

## 2022-07-04 DIAGNOSIS — G2401 Drug induced subacute dyskinesia: Secondary | ICD-10-CM

## 2022-07-04 DIAGNOSIS — F17210 Nicotine dependence, cigarettes, uncomplicated: Secondary | ICD-10-CM | POA: Diagnosis not present

## 2022-07-04 NOTE — Patient Instructions (Signed)
Continue using your nebulizers as you are doing.  Work on quitting smoking.  We will see you in follow-up in 3 months time call sooner should any new problems arise.
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
## 2022-07-06 ENCOUNTER — Encounter: Payer: Self-pay | Admitting: Nurse Practitioner

## 2022-07-06 ENCOUNTER — Ambulatory Visit: Payer: Medicaid Other | Admitting: Nurse Practitioner

## 2022-07-06 VITALS — BP 124/75 | HR 76 | Temp 98.3°F | Resp 16 | Ht 67.0 in | Wt 221.0 lb

## 2022-07-06 DIAGNOSIS — M5442 Lumbago with sciatica, left side: Secondary | ICD-10-CM | POA: Diagnosis not present

## 2022-07-06 DIAGNOSIS — M549 Dorsalgia, unspecified: Secondary | ICD-10-CM | POA: Diagnosis not present

## 2022-07-06 DIAGNOSIS — Z9889 Other specified postprocedural states: Secondary | ICD-10-CM | POA: Diagnosis not present

## 2022-07-06 DIAGNOSIS — M5441 Lumbago with sciatica, right side: Secondary | ICD-10-CM | POA: Diagnosis not present

## 2022-07-06 MED ORDER — MELOXICAM 15 MG PO TABS: 15.0000 mg | ORAL_TABLET | Freq: Every day | ORAL | 2 refills | Status: DC

## 2022-07-06 MED ORDER — METHOCARBAMOL 750 MG PO TABS: 750.0000 mg | ORAL_TABLET | Freq: Three times a day (TID) | ORAL | 2 refills | Status: DC | PRN

## 2022-07-06 NOTE — Progress Notes (Signed)
[facility], [place] [address]  Internal MEDICINE  Office Visit Note  Patient Name: Christopher Mason  [id number]  [id number]  Date of Service: 07/06/2022  Chief Complaint  Patient presents with   Acute Visit    Back pain     HPI Christopher Mason presents for an acute sick visit for back pain Back started hurting 2 weeks ago Has a history of 8 back surgeries.  Constant pain, worsens with movement/bending over. Bilateral sciatica constantly.  Rates the pain a 10 out of 10.      Current Medication:  Outpatient Encounter Medications as of 07/06/2022  Medication Sig   albuterol (VENTOLIN HFA) 108 (90 Base) MCG/ACT inhaler Inhale 2 puffs into the lungs every 6 (six) hours as needed for wheezing or shortness of breath.   amitriptyline (ELAVIL) 50 MG tablet Take 1 tablet (50 mg total) by mouth at bedtime.   amLODipine (NORVASC) 10 MG tablet TAKE 1 TABLET (10 MG TOTAL) BY MOUTH DAILY.   atorvastatin (LIPITOR) 20 MG tablet Take 1 tablet (20 mg total) by mouth daily.   docusate sodium (COLACE) 100 MG capsule TAKE 1 CAPSULE (100 MG TOTAL) BY MOUTH 2 (TWO) TIMES DAILY.   INVEGA 9 MG 24 hr tablet Take 9 mg by mouth every morning.   INVEGA SUSTENNA 234 MG/1.5ML injection Inject into the muscle. Every 4 weeks.   ipratropium-albuterol (DUONEB) 0.5-2.5 (3) MG/3ML SOLN Take 3 mLs by nebulization every 6 (six) hours as needed.   levothyroxine (SYNTHROID) 137 MCG tablet Take 1 tablet (137 mcg total) by mouth daily before breakfast.   levothyroxine (SYNTHROID) 150 MCG tablet TAKE 1 TABLET (150 MCG TOTAL) BY MOUTH DAILY.   lisinopril (ZESTRIL) 40 MG tablet TAKE 1 TABLET (40 MG TOTAL) BY MOUTH DAILY.   meloxicam (MOBIC) 15 MG tablet Take 1 tablet (15 mg total) by mouth daily. In am with breakfast   methocarbamol (ROBAXIN) 750 MG tablet Take 1 tablet (750 mg total) by mouth every 8 (eight) hours as needed for muscle spasms.   nicotine (NICODERM CQ - DOSED IN MG/24 HOURS)  21 mg/24hr patch PLACE 1 PATCH (21 MG TOTAL) ONTO THE SKIN DAILY.   pantoprazole (PROTONIX) 40 MG tablet Take 1 tablet (40 mg total) by mouth daily.   pravastatin (PRAVACHOL) 10 MG tablet Take 10 mg by mouth daily.   propranolol (INDERAL) 10 MG tablet Take 1 tablet (10 mg total) by mouth 3 (three) times daily.   solifenacin (VESICARE) 10 MG tablet Take 10 mg by mouth daily.   tamsulosin (FLOMAX) 0.4 MG CAPS capsule TAKE ONE CAPSULE BY MOUTH EVERY DAY   traZODone (DESYREL) 100 MG tablet Take 100 mg by mouth at bedtime.   venlafaxine XR (EFFEXOR-XR) 75 MG 24 hr capsule Take 1 capsule (75 mg total) by mouth daily with breakfast.   Vitamin D, Ergocalciferol, (DRISDOL) 1.25 MG (50000 UNIT) CAPS capsule TAKE 1 CAPSULE (50,000 UNITS TOTAL) BY MOUTH ONCE A WEEK. Q1W1   No facility-administered encounter medications on file as of 07/06/2022.      Medical History: Past Medical History:  Diagnosis Date   Acute kidney injury ([place]) 11/20/2014   ARF (acute renal failure) ([place]) 11/06/2014   Asthma    Bipolar disorder ([place])    COPD (chronic obstructive pulmonary disease) ([place])    Depression    Gastritis 02/07/2015   GERD (gastroesophageal reflux disease)    GI bleeding 09/05/2015   History of colon polyps    HLD (hyperlipidemia)  Hypertension    Hypokalemia 11/06/2014   Hyponatremia 02/07/2015   Hypotension 11/06/2014   Hypothyroid    Irritable bowel syndrome (IBS)    Non compliance w medication regimen 09/16/2014   Schizophrenia ([facility])      Vital Signs: BP 124/75   Pulse 76   Temp 98.3 F (36.8 C)   Resp 16   Ht '5\' 7"'$  (1.702 m)   Wt 221 lb (100.2 kg)   SpO2 93%   BMI 34.61 kg/m    Review of Systems  Constitutional:  Positive for fatigue. Negative for chills and unexpected weight change.  HENT:  Negative for congestion, rhinorrhea, sneezing and sore throat.   Eyes:  Negative for redness.  Respiratory:  Negative for cough, chest tightness, shortness of breath and wheezing.    Cardiovascular:  Negative for chest pain and palpitations.  Gastrointestinal:  Negative for abdominal pain, constipation, diarrhea, nausea and vomiting.  Genitourinary:  Negative for dysuria and frequency.  Musculoskeletal:  Positive for arthralgias, back pain, gait problem and myalgias. Negative for joint swelling and neck pain.  Skin:  Negative for rash.  Neurological:  Negative for tremors and numbness.  Hematological:  Negative for adenopathy. Does not bruise/bleed easily.  Psychiatric/Behavioral:  Negative for behavioral problems (Depression), sleep disturbance and suicidal ideas. The patient is not nervous/anxious.     Physical Exam Vitals reviewed.  Constitutional:      General: He is not in acute distress.    Appearance: Normal appearance. He is obese. He is not ill-appearing.  HENT:     Head: Normocephalic and atraumatic.  Eyes:     Pupils: Pupils are equal, round, and reactive to light.  Cardiovascular:     Rate and Rhythm: Normal rate and regular rhythm.  Pulmonary:     Effort: Pulmonary effort is normal. No respiratory distress.  Neurological:     Mental Status: He is alert and oriented to person, place, and time.  Psychiatric:        Mood and Affect: Mood normal.        Behavior: Behavior normal.       Assessment/Plan: 1. Acute bilateral low back pain with bilateral sciatica Medication prescribed for pain and spasms and lumbar spine xray ordered. Will call with results of xray and next steps.  - methocarbamol (ROBAXIN) 750 MG tablet; Take 1 tablet (750 mg total) by mouth every 8 (eight) hours as needed for muscle spasms.  Dispense: 90 tablet; Refill: 2 - meloxicam (MOBIC) 15 MG tablet; Take 1 tablet (15 mg total) by mouth daily. In am with breakfast  Dispense: 30 tablet; Refill: 2 - DG Lumbar Spine Complete; Future  2. Back pain with history of spinal surgery Xray of lumbar spine ordered as well as meloxicam for pain and muscle relaxant for spasms and pain -  methocarbamol (ROBAXIN) 750 MG tablet; Take 1 tablet (750 mg total) by mouth every 8 (eight) hours as needed for muscle spasms.  Dispense: 90 tablet; Refill: 2 - meloxicam (MOBIC) 15 MG tablet; Take 1 tablet (15 mg total) by mouth daily. In am with breakfast  Dispense: 30 tablet; Refill: 2 - DG Lumbar Spine Complete; Future   General Counseling: markcus twiddy understanding of the findings of todays visit and agrees with plan of treatment. I have discussed any further diagnostic evaluation that may be needed or ordered today. We also reviewed his medications today. he has been encouraged to call the office with any questions or concerns that should arise related to todays visit.  Counseling:    Orders Placed This Encounter  Procedures   DG Lumbar Spine Complete    Meds ordered this encounter  Medications   methocarbamol (ROBAXIN) 750 MG tablet    Sig: Take 1 tablet (750 mg total) by mouth every 8 (eight) hours as needed for muscle spasms.    Dispense:  90 tablet    Refill:  2   meloxicam (MOBIC) 15 MG tablet    Sig: Take 1 tablet (15 mg total) by mouth daily. In am with breakfast    Dispense:  30 tablet    Refill:  2    Return if symptoms worsen or fail to improve, for will call with xray results and next steps .  [place] Controlled Substance Database was reviewed by me for overdose risk score (ORS)  Time spent:20 Minutes Time spent with patient included reviewing progress notes, labs, imaging studies, and discussing plan for follow up.   This patient was seen by Jonetta Osgood, FNP-C in collaboration with Dr. Clayborn Bigness as a part of collaborative care agreement.  Loie Jahr R. Valetta Fuller, MSN, FNP-C Internal Medicine

## 2022-07-11 ENCOUNTER — Other Ambulatory Visit: Payer: Self-pay | Admitting: Nurse Practitioner

## 2022-07-11 DIAGNOSIS — E039 Hypothyroidism, unspecified: Secondary | ICD-10-CM

## 2022-07-11 DIAGNOSIS — E538 Deficiency of other specified B group vitamins: Secondary | ICD-10-CM

## 2022-07-11 DIAGNOSIS — F17200 Nicotine dependence, unspecified, uncomplicated: Secondary | ICD-10-CM

## 2022-07-12 NOTE — Telephone Encounter (Signed)
Ok to send please review

## 2022-07-31 ENCOUNTER — Ambulatory Visit (INDEPENDENT_AMBULATORY_CARE_PROVIDER_SITE_OTHER): Payer: Medicaid Other | Admitting: Plastic Surgery

## 2022-07-31 ENCOUNTER — Encounter: Payer: Self-pay | Admitting: Plastic Surgery

## 2022-07-31 VITALS — BP 149/88 | HR 88

## 2022-07-31 DIAGNOSIS — T22042S Burn of unspecified degree of left axilla, sequela: Secondary | ICD-10-CM

## 2022-07-31 DIAGNOSIS — L905 Scar conditions and fibrosis of skin: Secondary | ICD-10-CM

## 2022-07-31 DIAGNOSIS — F17210 Nicotine dependence, cigarettes, uncomplicated: Secondary | ICD-10-CM

## 2022-07-31 NOTE — Progress Notes (Signed)
Christopher Mason returns today for evaluation of the scar in his left axilla.  He states that he has been working on his smoking and is down to a pack a day.  He feels that he can continue to cut down and will be down to half a pack a day in 2 weeks.  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  On discussion with him today he does not have anybody at home with him and I believe he will need to spend the night in the hospital after the procedure.  He is agreeable to this.  Return in 2 weeks for scheduling.

## 2022-08-13 ENCOUNTER — Other Ambulatory Visit: Payer: Self-pay

## 2022-08-13 ENCOUNTER — Encounter: Payer: Self-pay | Admitting: *Deleted

## 2022-08-13 ENCOUNTER — Emergency Department
Admission: EM | Admit: 2022-08-13 | Discharge: 2022-08-13 | Disposition: A | Discharge status: Home / Self Care | Payer: Medicaid Other | Attending: Emergency Medicine | Admitting: Emergency Medicine

## 2022-08-13 ENCOUNTER — Emergency Department: Payer: Medicaid Other

## 2022-08-13 DIAGNOSIS — M25552 Pain in left hip: Secondary | ICD-10-CM

## 2022-08-13 DIAGNOSIS — J45909 Unspecified asthma, uncomplicated: Secondary | ICD-10-CM | POA: Insufficient documentation

## 2022-08-13 DIAGNOSIS — I10 Essential (primary) hypertension: Secondary | ICD-10-CM | POA: Diagnosis not present

## 2022-08-13 DIAGNOSIS — E039 Hypothyroidism, unspecified: Secondary | ICD-10-CM | POA: Insufficient documentation

## 2022-08-13 DIAGNOSIS — J449 Chronic obstructive pulmonary disease, unspecified: Secondary | ICD-10-CM | POA: Insufficient documentation

## 2022-08-13 MED ORDER — KETOROLAC TROMETHAMINE 30 MG/ML IJ SOLN
30.0000 mg | Freq: Once | INTRAMUSCULAR | Status: AC
  Administered 2022-08-13: 30 mg via INTRAMUSCULAR
  Filled 2022-08-13: qty 1

## 2022-08-13 NOTE — ED Notes (Signed)
Patient was able to stand and walk a short distance with stand by assist and a steady gait.

## 2022-08-13 NOTE — ED Triage Notes (Signed)
Per EMS report, patient c/o left leg pain from hip to knee that is sharp and constant. Patient states pain worsens when walking. 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  159/96 78 pulse 95% room air

## 2022-08-13 NOTE — ED Provider Notes (Signed)
[facility] Provider Note    Event Date/Time   First MD Initiated Contact with Patient 08/13/22 1234     (approximate)   History   Chief Complaint Leg Pain   HPI  Christopher Mason is a 62 y.o. male with past medical history of hypertension, asthma, COPD, hypothyroidism, tardive dyskinesia, and schizophrenia who presents to the ED complaining of leg pain.  Patient reports that he has had 2 days of worsening pain starting in his left hip and moving in to his left thigh.  He denies any associated falls, has not had any numbness or weakness in the leg but states it is painful to move it and bear weight.  He denies any history of similar issues, has not had any back pain, numbness in his groin, or incontinence.  He states he has not taken anything for his pain prior to arrival.     Physical Exam   Triage Vital Signs: ED Triage Vitals  Enc Vitals Group     BP      Pulse      Resp      Temp      Temp src      SpO2      Weight      Height      Head Circumference      Peak Flow      Pain Score      Pain Loc      Pain Edu?      Excl. in GC?     Most recent vital signs: Vitals:   08/13/22 1239 08/13/22 1415  BP: (!) 151/101 (!) 150/80  Pulse: 67 71  Resp: 18 18  Temp: 97.6 F (36.4 C)   SpO2: 96% 97%    Constitutional: Alert and oriented. Eyes: Conjunctivae are normal. Head: Atraumatic. Nose: No congestion/rhinnorhea. Mouth/Throat: Mucous membranes are moist.  Cardiovascular: Normal rate, regular rhythm. Grossly normal heart sounds.  2+ radial and DP pulses bilaterally. Respiratory: Normal respiratory effort.  No retractions. Lungs CTAB. Gastrointestinal: Soft and nontender. No distention. Musculoskeletal: Diffuse tenderness to palpation of left hip with pain upon range of motion, no associated erythema, edema, or warmth noted.  No tenderness to palpation at right hip, bilateral knees or ankles. Neurologic:  Normal speech and language. No  gross focal neurologic deficits are appreciated.    ED Results / Procedures / Treatments   Labs (all labs ordered are listed, but only abnormal results are displayed) Labs Reviewed - No data to display  RADIOLOGY Left hip x-ray reviewed and interpreted by me with no fracture or dislocation.  PROCEDURES:  Critical Care performed: No  Procedures   MEDICATIONS ORDERED IN ED: Medications  ketorolac (TORADOL) 30 MG/ML injection 30 mg (30 mg Intramuscular Given 08/13/22 1245)     IMPRESSION / MDM / ASSESSMENT AND PLAN / ED COURSE  I reviewed the triage vital signs and the nursing notes.                              62 y.o. male with past medical history of hypertension, asthma, COPD, schizophrenia, hypothyroidism, and tardive dyskinesia who presents to the ED complaining of increasing pain in his left hip radiating towards his left knee over the past 2 days without associated trauma.  Patient's presentation is most consistent with acute complicated illness / injury requiring diagnostic workup.  Differential diagnosis includes, but is not limited to, fracture,  dislocation, osteoarthritis, septic arthritis, lumbar radiculopathy.  Patient well-appearing and in no acute distress, vital signs are unremarkable.  He is neurovascularly intact to his distal left lower extremity, no findings concerning for cauda equina.  He also has no findings concerning for infectious process, no evidence of septic arthritis.  We will further assess with x-ray, treat symptomatically with IM Toradol and reassess.  Left hip x-ray is unremarkable, patient reports feeling better following dose of Toradol.  He has been ambulatory here in the ED with assistance, is appropriate for discharge home with PCP follow-up, doubt occult hip fracture.  He was counseled to alternate Tylenol and NSAIDs, counseled to return to the ED for new or worsening symptoms.  Patient agrees with plan.      FINAL CLINICAL  IMPRESSION(S) / ED DIAGNOSES   Final diagnoses:  Left hip pain     Rx / DC Orders   ED Discharge Orders     None        Note:  This document was prepared using Dragon voice recognition software and may include unintentional dictation errors.   Chesley Noon, MD 08/13/22 [phone]

## 2022-08-15 ENCOUNTER — Ambulatory Visit: Payer: Medicaid Other | Admitting: Nurse Practitioner

## 2022-08-16 ENCOUNTER — Encounter: Payer: Self-pay | Admitting: Internal Medicine

## 2022-08-16 ENCOUNTER — Other Ambulatory Visit: Payer: Self-pay

## 2022-08-16 ENCOUNTER — Emergency Department: Payer: Medicaid Other

## 2022-08-16 ENCOUNTER — Inpatient Hospital Stay
Admission: EM | Admit: 2022-08-16 | Discharge: 2022-09-20 | DRG: 981 | Disposition: A | Discharge status: Skilled Nursing Facility | Payer: Medicaid Other | Attending: Osteopathic Medicine | Admitting: Osteopathic Medicine

## 2022-08-16 DIAGNOSIS — R29898 Other symptoms and signs involving the musculoskeletal system: Secondary | ICD-10-CM

## 2022-08-16 DIAGNOSIS — M25552 Pain in left hip: Secondary | ICD-10-CM | POA: Diagnosis present

## 2022-08-16 DIAGNOSIS — N179 Acute kidney failure, unspecified: Secondary | ICD-10-CM | POA: Diagnosis not present

## 2022-08-16 DIAGNOSIS — Z8249 Family history of ischemic heart disease and other diseases of the circulatory system: Secondary | ICD-10-CM

## 2022-08-16 DIAGNOSIS — M5116 Intervertebral disc disorders with radiculopathy, lumbar region: Secondary | ICD-10-CM | POA: Diagnosis present

## 2022-08-16 DIAGNOSIS — M5416 Radiculopathy, lumbar region: Secondary | ICD-10-CM

## 2022-08-16 DIAGNOSIS — Z818 Family history of other mental and behavioral disorders: Secondary | ICD-10-CM

## 2022-08-16 DIAGNOSIS — Z23 Encounter for immunization: Secondary | ICD-10-CM

## 2022-08-16 DIAGNOSIS — Z6838 Body mass index (BMI) 38.0-38.9, adult: Secondary | ICD-10-CM

## 2022-08-16 DIAGNOSIS — R5383 Other fatigue: Secondary | ICD-10-CM | POA: Diagnosis present

## 2022-08-16 DIAGNOSIS — D509 Iron deficiency anemia, unspecified: Secondary | ICD-10-CM | POA: Diagnosis not present

## 2022-08-16 DIAGNOSIS — K589 Irritable bowel syndrome without diarrhea: Secondary | ICD-10-CM | POA: Diagnosis present

## 2022-08-16 DIAGNOSIS — G4700 Insomnia, unspecified: Secondary | ICD-10-CM | POA: Diagnosis not present

## 2022-08-16 DIAGNOSIS — Z79890 Hormone replacement therapy (postmenopausal): Secondary | ICD-10-CM

## 2022-08-16 DIAGNOSIS — J441 Chronic obstructive pulmonary disease with (acute) exacerbation: Secondary | ICD-10-CM | POA: Diagnosis not present

## 2022-08-16 DIAGNOSIS — J9601 Acute respiratory failure with hypoxia: Secondary | ICD-10-CM

## 2022-08-16 DIAGNOSIS — E039 Hypothyroidism, unspecified: Secondary | ICD-10-CM | POA: Diagnosis present

## 2022-08-16 DIAGNOSIS — W19XXXA Unspecified fall, initial encounter: Secondary | ICD-10-CM | POA: Diagnosis present

## 2022-08-16 DIAGNOSIS — F319 Bipolar disorder, unspecified: Secondary | ICD-10-CM | POA: Diagnosis present

## 2022-08-16 DIAGNOSIS — J189 Pneumonia, unspecified organism: Secondary | ICD-10-CM

## 2022-08-16 DIAGNOSIS — E8770 Fluid overload, unspecified: Secondary | ICD-10-CM | POA: Diagnosis not present

## 2022-08-16 DIAGNOSIS — E669 Obesity, unspecified: Secondary | ICD-10-CM | POA: Diagnosis present

## 2022-08-16 DIAGNOSIS — R601 Generalized edema: Secondary | ICD-10-CM | POA: Insufficient documentation

## 2022-08-16 DIAGNOSIS — K5900 Constipation, unspecified: Secondary | ICD-10-CM | POA: Insufficient documentation

## 2022-08-16 DIAGNOSIS — Z7401 Bed confinement status: Secondary | ICD-10-CM

## 2022-08-16 DIAGNOSIS — J9811 Atelectasis: Secondary | ICD-10-CM | POA: Diagnosis present

## 2022-08-16 DIAGNOSIS — E785 Hyperlipidemia, unspecified: Secondary | ICD-10-CM | POA: Diagnosis present

## 2022-08-16 DIAGNOSIS — E871 Hypo-osmolality and hyponatremia: Secondary | ICD-10-CM | POA: Diagnosis present

## 2022-08-16 DIAGNOSIS — Z833 Family history of diabetes mellitus: Secondary | ICD-10-CM

## 2022-08-16 DIAGNOSIS — N400 Enlarged prostate without lower urinary tract symptoms: Secondary | ICD-10-CM | POA: Diagnosis present

## 2022-08-16 DIAGNOSIS — F1210 Cannabis abuse, uncomplicated: Secondary | ICD-10-CM | POA: Diagnosis present

## 2022-08-16 DIAGNOSIS — I10 Essential (primary) hypertension: Secondary | ICD-10-CM | POA: Diagnosis present

## 2022-08-16 DIAGNOSIS — Z791 Long term (current) use of non-steroidal anti-inflammatories (NSAID): Secondary | ICD-10-CM

## 2022-08-16 DIAGNOSIS — F200 Paranoid schizophrenia: Secondary | ICD-10-CM

## 2022-08-16 DIAGNOSIS — G4730 Sleep apnea, unspecified: Secondary | ICD-10-CM | POA: Diagnosis present

## 2022-08-16 DIAGNOSIS — M25562 Pain in left knee: Secondary | ICD-10-CM | POA: Diagnosis present

## 2022-08-16 DIAGNOSIS — Z1152 Encounter for screening for COVID-19: Secondary | ICD-10-CM

## 2022-08-16 DIAGNOSIS — Z79899 Other long term (current) drug therapy: Secondary | ICD-10-CM

## 2022-08-16 DIAGNOSIS — J9611 Chronic respiratory failure with hypoxia: Secondary | ICD-10-CM

## 2022-08-16 DIAGNOSIS — M4727 Other spondylosis with radiculopathy, lumbosacral region: Secondary | ICD-10-CM | POA: Diagnosis present

## 2022-08-16 DIAGNOSIS — K219 Gastro-esophageal reflux disease without esophagitis: Secondary | ICD-10-CM | POA: Diagnosis present

## 2022-08-16 DIAGNOSIS — Z886 Allergy status to analgesic agent status: Secondary | ICD-10-CM

## 2022-08-16 DIAGNOSIS — Z86010 Personal history of colonic polyps: Secondary | ICD-10-CM

## 2022-08-16 DIAGNOSIS — F17210 Nicotine dependence, cigarettes, uncomplicated: Secondary | ICD-10-CM | POA: Diagnosis present

## 2022-08-16 DIAGNOSIS — F209 Schizophrenia, unspecified: Secondary | ICD-10-CM | POA: Diagnosis present

## 2022-08-16 DIAGNOSIS — G8929 Other chronic pain: Secondary | ICD-10-CM | POA: Diagnosis present

## 2022-08-16 DIAGNOSIS — R296 Repeated falls: Secondary | ICD-10-CM | POA: Diagnosis present

## 2022-08-16 DIAGNOSIS — Z9049 Acquired absence of other specified parts of digestive tract: Secondary | ICD-10-CM

## 2022-08-16 DIAGNOSIS — J449 Chronic obstructive pulmonary disease, unspecified: Secondary | ICD-10-CM | POA: Diagnosis present

## 2022-08-16 DIAGNOSIS — J440 Chronic obstructive pulmonary disease with acute lower respiratory infection: Secondary | ICD-10-CM | POA: Diagnosis present

## 2022-08-16 DIAGNOSIS — G2401 Drug induced subacute dyskinesia: Secondary | ICD-10-CM | POA: Diagnosis present

## 2022-08-16 DIAGNOSIS — E860 Dehydration: Secondary | ICD-10-CM | POA: Diagnosis present

## 2022-08-16 HISTORY — DX: Pneumonia, unspecified organism: J18.9

## 2022-08-16 LAB — URINALYSIS, ROUTINE W REFLEX MICROSCOPIC
Bacteria, UA: NONE SEEN
Bilirubin Urine: NEGATIVE
Glucose, UA: NEGATIVE mg/dL
Ketones, ur: NEGATIVE mg/dL
Leukocytes,Ua: NEGATIVE
Nitrite: NEGATIVE
Protein, ur: NEGATIVE mg/dL
Specific Gravity, Urine: 1.019 (ref 1.005–1.030)
Squamous Epithelial / HPF: NONE SEEN /HPF (ref 0–5)
pH: 5 (ref 5.0–8.0)

## 2022-08-16 LAB — SARS CORONAVIRUS 2 BY RT PCR: SARS Coronavirus 2 by RT PCR: NEGATIVE

## 2022-08-16 LAB — RESPIRATORY PANEL BY PCR

## 2022-08-16 LAB — BASIC METABOLIC PANEL
Anion gap: 6 (ref 5–15)
BUN: 26 mg/dL — ABNORMAL HIGH (ref 8–23)
CO2: 23 mmol/L (ref 22–32)
Calcium: 8.9 mg/dL (ref 8.9–10.3)
Chloride: 107 mmol/L (ref 98–111)
Creatinine, Ser: 1.79 mg/dL — ABNORMAL HIGH (ref 0.61–1.24)
GFR, Estimated: 42 mL/min — ABNORMAL LOW (ref >60)
Glucose, Bld: 150 mg/dL — ABNORMAL HIGH (ref 70–99)
Potassium: 3.6 mmol/L (ref 3.5–5.1)
Sodium: 136 mmol/L (ref 135–145)

## 2022-08-16 LAB — PROCALCITONIN: Procalcitonin: <0.1 ng/mL

## 2022-08-16 LAB — CK: Total CK: 1044 U/L — ABNORMAL HIGH (ref 49–397)

## 2022-08-16 LAB — CBC
HCT: 40.8 % (ref 39.0–52.0)
Hemoglobin: 12.7 g/dL — ABNORMAL LOW (ref 13.0–17.0)
MCH: 28.7 pg (ref 26.0–34.0)
MCHC: 31.1 g/dL (ref 30.0–36.0)
MCV: 92.3 fL (ref 80.0–100.0)
Platelets: 208 10*3/uL (ref 150–400)
RBC: 4.42 MIL/uL (ref 4.22–5.81)
RDW: 14.6 % (ref 11.5–15.5)
WBC: 9.2 10*3/uL (ref 4.0–10.5)
nRBC: 0 % (ref 0.0–0.2)

## 2022-08-16 LAB — HEPATIC FUNCTION PANEL
ALT: 22 U/L (ref 0–44)
AST: 39 U/L (ref 15–41)
Albumin: 3.4 g/dL — ABNORMAL LOW (ref 3.5–5.0)
Alkaline Phosphatase: 75 U/L (ref 38–126)
Bilirubin, Direct: 0.2 mg/dL (ref 0.0–0.2)
Indirect Bilirubin: 0.7 mg/dL (ref 0.3–0.9)
Total Bilirubin: 0.9 mg/dL (ref 0.3–1.2)
Total Protein: 6.9 g/dL (ref 6.5–8.1)

## 2022-08-16 LAB — TROPONIN I (HIGH SENSITIVITY): Troponin I (High Sensitivity): 17 ng/L (ref <18)

## 2022-08-16 LAB — BRAIN NATRIURETIC PEPTIDE: B Natriuretic Peptide: 28 pg/mL (ref 0.0–100.0)

## 2022-08-16 MED ORDER — LEVOTHYROXINE SODIUM 137 MCG PO TABS
137.0000 ug | ORAL_TABLET | Freq: Every day | ORAL | Status: DC
  Administered 2022-08-17 – 2022-09-05 (×20): 137 ug via ORAL
  Filled 2022-08-16 (×20): qty 1

## 2022-08-16 MED ORDER — ONDANSETRON HCL 4 MG/2ML IJ SOLN
4.0000 mg | Freq: Four times a day (QID) | INTRAMUSCULAR | Status: DC | PRN
  Administered 2022-09-02: 4 mg via INTRAVENOUS
  Filled 2022-08-16 (×2): qty 2

## 2022-08-16 MED ORDER — PREDNISONE 20 MG PO TABS
40.0000 mg | ORAL_TABLET | Freq: Every day | ORAL | Status: AC
  Administered 2022-08-16 – 2022-08-20 (×5): 40 mg via ORAL
  Filled 2022-08-16 (×5): qty 2

## 2022-08-16 MED ORDER — ADULT MULTIVITAMIN W/MINERALS CH
1.0000 | ORAL_TABLET | Freq: Every day | ORAL | Status: DC
  Administered 2022-08-16 – 2022-09-19 (×34): 1 via ORAL
  Filled 2022-08-16 (×35): qty 1

## 2022-08-16 MED ORDER — TRAZODONE HCL 100 MG PO TABS
100.0000 mg | ORAL_TABLET | Freq: Every day | ORAL | Status: DC
  Administered 2022-08-16 – 2022-09-19 (×35): 100 mg via ORAL
  Filled 2022-08-16 (×35): qty 1

## 2022-08-16 MED ORDER — HYDROMORPHONE HCL 1 MG/ML IJ SOLN
0.5000 mg | Freq: Once | INTRAMUSCULAR | Status: AC
  Administered 2022-08-16: 0.5 mg via INTRAVENOUS
  Filled 2022-08-16: qty 0.5

## 2022-08-16 MED ORDER — SODIUM CHLORIDE 0.9 % IV BOLUS
500.0000 mL | Freq: Once | INTRAVENOUS | Status: AC
  Administered 2022-08-16: 500 mL via INTRAVENOUS

## 2022-08-16 MED ORDER — TETANUS-DIPHTH-ACELL PERTUSSIS 5-2.5-18.5 LF-MCG/0.5 IM SUSY
0.5000 mL | PREFILLED_SYRINGE | Freq: Once | INTRAMUSCULAR | Status: AC
  Administered 2022-08-16: 0.5 mL via INTRAMUSCULAR
  Filled 2022-08-16: qty 0.5

## 2022-08-16 MED ORDER — ACETAMINOPHEN 325 MG PO TABS
650.0000 mg | ORAL_TABLET | Freq: Four times a day (QID) | ORAL | Status: DC | PRN
  Administered 2022-08-16 – 2022-09-16 (×4): 650 mg via ORAL
  Filled 2022-08-16 (×4): qty 2

## 2022-08-16 MED ORDER — AMITRIPTYLINE HCL 25 MG PO TABS
50.0000 mg | ORAL_TABLET | Freq: Every day | ORAL | Status: DC
  Administered 2022-08-16 – 2022-09-03 (×19): 50 mg via ORAL
  Filled 2022-08-16 (×19): qty 2

## 2022-08-16 MED ORDER — LACTATED RINGERS IV SOLN: INTRAVENOUS | Status: AC

## 2022-08-16 MED ORDER — THIAMINE MONONITRATE 100 MG PO TABS
100.0000 mg | ORAL_TABLET | Freq: Every day | ORAL | Status: DC
  Administered 2022-08-16 – 2022-09-20 (×35): 100 mg via ORAL
  Filled 2022-08-16 (×35): qty 1

## 2022-08-16 MED ORDER — FOLIC ACID 1 MG PO TABS
1.0000 mg | ORAL_TABLET | Freq: Every day | ORAL | Status: DC
  Administered 2022-08-16 – 2022-09-20 (×35): 1 mg via ORAL
  Filled 2022-08-16 (×35): qty 1

## 2022-08-16 MED ORDER — POLYETHYLENE GLYCOL 3350 17 G PO PACK
17.0000 g | PACK | Freq: Every day | ORAL | Status: DC | PRN
  Administered 2022-08-18 – 2022-08-19 (×2): 17 g via ORAL
  Filled 2022-08-16 (×2): qty 1

## 2022-08-16 MED ORDER — ONDANSETRON HCL 4 MG PO TABS
4.0000 mg | ORAL_TABLET | Freq: Four times a day (QID) | ORAL | Status: DC | PRN
  Administered 2022-08-17: 4 mg via ORAL
  Filled 2022-08-16: qty 1

## 2022-08-16 MED ORDER — SODIUM CHLORIDE 0.9 % IV SOLN
500.0000 mg | INTRAVENOUS | Status: DC
  Administered 2022-08-16 – 2022-08-17 (×2): 500 mg via INTRAVENOUS
  Filled 2022-08-16 (×2): qty 5

## 2022-08-16 MED ORDER — PROPRANOLOL HCL 20 MG PO TABS
10.0000 mg | ORAL_TABLET | Freq: Three times a day (TID) | ORAL | Status: DC
  Administered 2022-08-16 – 2022-09-20 (×102): 10 mg via ORAL
  Filled 2022-08-16 (×104): qty 1

## 2022-08-16 MED ORDER — ONDANSETRON HCL 4 MG/2ML IJ SOLN
4.0000 mg | Freq: Once | INTRAMUSCULAR | Status: AC
  Administered 2022-08-16: 4 mg via INTRAVENOUS
  Filled 2022-08-16: qty 2

## 2022-08-16 MED ORDER — DOCUSATE SODIUM 100 MG PO CAPS
100.0000 mg | ORAL_CAPSULE | Freq: Every day | ORAL | Status: DC | PRN
  Administered 2022-08-18: 100 mg via ORAL
  Filled 2022-08-16: qty 1

## 2022-08-16 MED ORDER — ENOXAPARIN SODIUM 60 MG/0.6ML IJ SOSY
0.5000 mg/kg | PREFILLED_SYRINGE | INTRAMUSCULAR | Status: DC
  Administered 2022-08-16: 45 mg via SUBCUTANEOUS
  Filled 2022-08-16: qty 0.6

## 2022-08-16 MED ORDER — VENLAFAXINE HCL ER 75 MG PO CP24
75.0000 mg | ORAL_CAPSULE | Freq: Every day | ORAL | Status: DC
  Administered 2022-08-17 – 2022-09-20 (×34): 75 mg via ORAL
  Filled 2022-08-16 (×35): qty 1

## 2022-08-16 MED ORDER — LACTATED RINGERS IV BOLUS
1000.0000 mL | Freq: Once | INTRAVENOUS | Status: AC
  Administered 2022-08-16: 1000 mL via INTRAVENOUS

## 2022-08-16 MED ORDER — ACETAMINOPHEN 650 MG RE SUPP: 650.0000 mg | Freq: Four times a day (QID) | RECTAL | Status: DC | PRN

## 2022-08-16 MED ORDER — PANTOPRAZOLE SODIUM 40 MG PO TBEC
40.0000 mg | DELAYED_RELEASE_TABLET | Freq: Every day | ORAL | Status: DC
  Administered 2022-08-16 – 2022-09-20 (×35): 40 mg via ORAL
  Filled 2022-08-16 (×35): qty 1

## 2022-08-16 MED ORDER — SODIUM CHLORIDE 0.9 % IV SOLN
1.0000 g | INTRAVENOUS | Status: DC
  Administered 2022-08-16 – 2022-08-17 (×2): 1 g via INTRAVENOUS
  Filled 2022-08-16 (×2): qty 10

## 2022-08-16 MED ORDER — ATORVASTATIN CALCIUM 20 MG PO TABS
20.0000 mg | ORAL_TABLET | Freq: Every day | ORAL | Status: DC
  Administered 2022-08-16: 20 mg via ORAL
  Filled 2022-08-16: qty 1

## 2022-08-16 MED ORDER — POTASSIUM CHLORIDE 20 MEQ PO PACK
40.0000 meq | PACK | Freq: Once | ORAL | Status: AC
  Administered 2022-08-16: 40 meq via ORAL
  Filled 2022-08-16: qty 2

## 2022-08-16 MED ORDER — IPRATROPIUM-ALBUTEROL 0.5-2.5 (3) MG/3ML IN SOLN
3.0000 mL | Freq: Four times a day (QID) | RESPIRATORY_TRACT | Status: DC
  Administered 2022-08-16 – 2022-08-17 (×5): 3 mL via RESPIRATORY_TRACT
  Filled 2022-08-16 (×5): qty 3

## 2022-08-16 MED ORDER — SODIUM CHLORIDE 0.9% FLUSH
3.0000 mL | Freq: Two times a day (BID) | INTRAVENOUS | Status: DC
  Administered 2022-08-16 – 2022-09-17 (×54): 3 mL via INTRAVENOUS

## 2022-08-16 NOTE — Progress Notes (Signed)
Anticoagulation monitoring(Lovenox):  61yo  M ordered Lovenox 40 mg Q24h    Filed Weights   08/16/22 1130  Weight: 90.7 kg (200 lb)   BMI 31.3   Lab Results  Component Value Date   CREATININE 1.79 (H) 08/16/2022   CREATININE 1.01 03/15/2022   CREATININE 0.85 06/08/2021   Estimated Creatinine Clearance: 45.9 mL/min (A) (by C-G formula based on SCr of 1.79 mg/dL (H)). Hemoglobin & Hematocrit     Component Value Date/Time   HGB 12.7 (L) 08/16/2022 1132   HGB 13.9 03/15/2022 1110   HCT 40.8 08/16/2022 1132   HCT 42.7 03/15/2022 1110     Per Protocol for Patient with estCrcl > 30 ml/min and BMI >30, will transition to Lovenox 0.5 mg /kg Q24h      Bari Mantis PharmD Clinical Pharmacist 08/16/2022

## 2022-08-16 NOTE — ED Triage Notes (Signed)
Pt to ED [facility] from home for increased falls. Reports multiple falls yesterday. Pt reports falling d/t continued pain in left leg and left leg giving out. EMS reports pt hypotensive on arrival, given NS PTA.  Pt yelling in triage regarding being in pain and not wanting to be in triage.

## 2022-08-16 NOTE — ED Notes (Addendum)
Rob Bunting RN ACTT [phone]; updated on pt status and plan of care

## 2022-08-16 NOTE — Assessment & Plan Note (Addendum)
Creatinine 1.79 on presentation and down to 0.91.
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
## 2022-08-16 NOTE — ED Notes (Signed)
Pt to MRI at this time.

## 2022-08-16 NOTE — Assessment & Plan Note (Addendum)
Treated during hospital course. 

## 2022-08-16 NOTE — ED Notes (Signed)
Report given to [facility], Charity fundraiser. Pt to go to room 148 from MRI.

## 2022-08-16 NOTE — ED Notes (Signed)
Pt informed of need for urine sample, unable to provide at this time. VSS, NAD, pt denies needs. WCTM. Call light in reach.

## 2022-08-16 NOTE — ED Notes (Signed)
Pt also endorses shob.

## 2022-08-16 NOTE — Assessment & Plan Note (Addendum)
Secondary to disc herniation

## 2022-08-16 NOTE — Assessment & Plan Note (Addendum)
Completed steroids.  Continue nebulizer treatments.  Will restart steroids tomorrow morning.

## 2022-08-16 NOTE — ED Notes (Addendum)
Pt desatting after pain medication admin, placed on 3L [place] at this time and tolerating well. WCTM.

## 2022-08-16 NOTE — Evaluation (Signed)
Occupational Therapy Evaluation Patient Details Name: Christopher Mason MRN: [id number] DOB: 24-Aug-1960 Today's Date: 08/16/2022   History of Present Illness Pt is a 62 y.o. male with medical history significant of COPD, schizophrenia, previous spinal surgery, hypothyroidism, hypothyroidism, bipolar disorder, tardive dyskinesia, who presents to the ED with complaints of falls.  MD assessment includes: COPD exacerbation, CAP, AKI, and LLE weakness. MRI impression includes: multilevel lumbar spondylosis, worst at L3-4, where a left subarticular disc extrusion results in compression of the traversing left L4 nerve root in the left lateral recess.  Moderate left neural foraminal narrowing at this level, displacement of the traversing left L3 nerve root in the left lateral recess at L2-3, and severe bilateral neural foraminal narrowing at L5-S1.   Clinical Impression   Patient presenting with decreased independence in self care, balance, functional mobility/transfers, and endurance. PTA pt lived alone, was independent for ADLs, received assistance as needed for IADLs, and was independent for functional mobility without an AD. Pt currently functioning at Min-Mod A for bed mobility, Min A to stand from elevated EOB, and CGA to take several steps toward recliner using a RW. Pt endorsed 10/10 pain in LLE during activity (RN aware). On 2L O2 via [place] t/o session. Pt will benefit from skilled acute OT services to address deficits noted below. OT recommends ongoing therapy upon discharge to maximize safety and independence with ADLs, decrease fall risk, decrease caregiver burden, and promote return to PLOF.      Recommendations for follow up therapy are one component of a multi-disciplinary discharge planning process, led by the attending physician.  Recommendations may be updated based on patient status, additional functional criteria and insurance authorization.   Assistance Recommended at Discharge Frequent or  constant Supervision/Assistance  Patient can return home with the following A lot of help with bathing/dressing/bathroom;A lot of help with walking and/or transfers;Assistance with cooking/housework;Assist for transportation;Help with stairs or ramp for entrance;Direct supervision/assist for medications management    Functional Status Assessment  Patient has had a recent decline in their functional status and demonstrates the ability to make significant improvements in function in a reasonable and predictable amount of time.  Equipment Recommendations  BSC/3in1    Recommendations for Other Services       Precautions / Restrictions Precautions Precautions: Fall Restrictions Weight Bearing Restrictions: No Other Position/Activity Restrictions: HOB to 30 deg      Mobility Bed Mobility Overal bed mobility: Needs Assistance Bed Mobility: Rolling, Sidelying to Sit Rolling: Min assist Sidelying to sit: Mod assist       General bed mobility comments: VC for log roll technique    Transfers Overall transfer level: Needs assistance Equipment used: Rolling walker (2 wheels) Transfers: Sit to/from Stand Sit to Stand: Min assist, From elevated surface           General transfer comment: VC for hand placement      Balance Overall balance assessment: Needs assistance, History of Falls   Sitting balance-Leahy Scale: Good     Standing balance support: Bilateral upper extremity supported, During functional activity, Reliant on assistive device for balance Standing balance-Leahy Scale: Fair       ADL either performed or assessed with clinical judgement   ADL Overall ADL's : Needs assistance/impaired     Grooming: Set up;Sitting                   Toilet Transfer: Min guard;Minimal assistance;Rolling walker (2 wheels);Cueing for safety;Cueing for sequencing Toilet Transfer Details (indicate cue type  and reason): simulated, short ambulatory transfer          Functional mobility during ADLs: Min guard;Rolling walker (2 wheels) (~19ft to bedside recliner)       Vision Baseline Vision/History: 1 Wears glasses Patient Visual Report: No change from baseline       Perception     Praxis      Pertinent Vitals/Pain Pain Assessment Pain Assessment: 0-10 Pain Score: 10-Worst pain ever Pain Location: LLE Pain Descriptors / Indicators: Sore Pain Intervention(s): Monitored during session, Premedicated before session, Repositioned     Hand Dominance Right   Extremity/Trunk Assessment Upper Extremity Assessment Upper Extremity Assessment: Overall WFL for tasks assessed (pt reports previous R rotator cuff sx)   Lower Extremity Assessment Lower Extremity Assessment: Generalized weakness LLE Deficits / Details: L knee flex strength 3+/5, L knee ext 2/5, unable to test hip flex strength secondary to pain       Communication Communication Communication: No difficulties   Cognition Arousal/Alertness: Awake/alert Behavior During Therapy: WFL for tasks assessed/performed Overall Cognitive Status: Within Functional Limits for tasks assessed         General Comments  Pt on 2L O2 via [place] t/o session. SpO2 93% at rest, high 80s to low 90s with activity but poor pleth.    Exercises Other Exercises Other Exercises: OT provided education re: role of OT, OT POC, post acute recs, sitting up for all meals, EOB/OOB mobility with assistance, home/fall safety, log roll technique for bed mobility     Shoulder Instructions      Home Living Family/patient expects to be discharged to:: Private residence Living Arrangements: Alone Available Help at Discharge: Friend(s);Available PRN/intermittently Type of Home: Apartment Home Access: Stairs to enter Entrance Stairs-Number of Steps: 4 Entrance Stairs-Rails: Right;Left;Can reach both Home Layout: One level     Bathroom Shower/Tub: Chief Strategy Officer: Standard     Home Equipment:  Rollator (4 wheels);Shower seat          Prior Functioning/Environment Prior Level of Function : Independent/Modified Independent;History of Falls (last six months)             Mobility Comments: Ind amb without an AD community distances prior to several days ago when his LLE became weak causing 8 falls in the last 2 days. No other fall history prior to recent falls. ADLs Comments: Ind with ADLs, friend/neighbor assists with IADLs (driving, groceries). Meds are pre-packaged.        OT Problem List: Decreased strength;Decreased activity tolerance;Decreased range of motion;Impaired balance (sitting and/or standing);Decreased knowledge of use of DME or AE;Decreased safety awareness;Pain      OT Treatment/Interventions: Self-care/ADL training;Therapeutic exercise;Energy conservation;DME and/or AE instruction;Therapeutic activities;Patient/family education;Balance training    OT Goals(Current goals can be found in the care plan section) Acute Rehab OT Goals Patient Stated Goal: return home OT Goal Formulation: With patient Time For Goal Achievement: 08/30/22 Potential to Achieve Goals: Good   OT Frequency: Min 1X/week    Co-evaluation PT/OT/SLP Co-Evaluation/Treatment: Yes Reason for Co-Treatment: For patient/therapist safety;To address functional/ADL transfers PT goals addressed during session: Mobility/safety with mobility;Strengthening/ROM OT goals addressed during session: ADL's and self-care;Proper use of Adaptive equipment and DME      AM-PAC OT "6 Clicks" Daily Activity     Outcome Measure Help from another person eating meals?: None Help from another person taking care of personal grooming?: A Little Help from another person toileting, which includes using toliet, bedpan, or urinal?: A Lot Help from another person bathing (  including washing, rinsing, drying)?: A Lot Help from another person to put on and taking off regular upper body clothing?: A Little Help from  another person to put on and taking off regular lower body clothing?: A Lot 6 Click Score: 16   End of Session Equipment Utilized During Treatment: Gait belt;Rolling walker (2 wheels);Oxygen Nurse Communication: Mobility status  Activity Tolerance: Patient tolerated treatment well Patient left: in chair;with call bell/phone within reach;with chair alarm set  OT Visit Diagnosis: Unsteadiness on feet (R26.81);Muscle weakness (generalized) (M62.81);History of falling (Z91.81);Pain Pain - Right/Left: Left Pain - part of body: Leg                Time: 1610-9604 OT Time Calculation (min): 28 min Charges:  OT General Charges $OT Visit: 1 Visit OT Evaluation $OT Eval Moderate Complexity: 1 Mod  [facility] MS, OTR/L ascom [phone]  08/16/22, 6:10 PM

## 2022-08-16 NOTE — Assessment & Plan Note (Signed)
-   Continue home amitriptyline, Invega and venlafaxine

## 2022-08-16 NOTE — Evaluation (Signed)
Physical Therapy Evaluation Patient Details Name: Christopher Mason MRN: [id number] DOB: 1960/10/07 Today's Date: 08/16/2022  History of Present Illness  Pt is a 62 y.o. male with medical history significant of COPD, schizophrenia, previous spinal surgery, hypothyroidism, hypothyroidism, bipolar disorder, tardive dyskinesia, who presents to the ED with complaints of falls.  MD assessment includes: COPD exacerbation, CAP, AKI, and LLE weakness. MRI impression includes: multilevel lumbar spondylosis, worst at L3-4, where a left subarticular disc extrusion results in compression of the traversing left L4 nerve root in the left lateral recess.  Moderate left neural foraminal narrowing at this level, displacement of the traversing left L3 nerve root in the left lateral recess at L2-3, and severe bilateral neural foraminal narrowing at L5-S1.   Clinical Impression  Pt was pleasant and motivated to participate during the session and put forth good effort throughout. Pt presented with significant deficits in LLE strength resulting in 8 recent falls prior to admission.  Pt required physical assistance with bed mobility and transfers and heavy multi-modal cuing for step-to sequencing with the RW to prevent L knee buckling while taking steps.  With proper sequencing pt was able to take several steps at the EOB and from bed to chair without buckling.  Pt's SpO2 was in the upper 80s to low 90s with mostly poor pleth on 2LO2/min with no adverse symptoms noted other than LLE pain during the session.  Pt is at a very high risk of falls and has no family/friends who would be available to provided consistent supervision. Pt will benefit from continued PT services upon discharge to safely address deficits listed in patient problem list for decreased caregiver assistance and eventual return to PLOF.           Recommendations for follow up therapy are one component of a multi-disciplinary discharge planning process, led by  the attending physician.  Recommendations may be updated based on patient status, additional functional criteria and insurance authorization.  Follow Up Recommendations Can patient physically be transported by private vehicle: No     Assistance Recommended at Discharge Frequent or constant Supervision/Assistance  Patient can return home with the following  A lot of help with walking and/or transfers;A lot of help with bathing/dressing/bathroom;Assistance with cooking/housework;Direct supervision/assist for medications management;Assist for transportation;Help with stairs or ramp for entrance    Equipment Recommendations Rolling walker (2 wheels);BSC/3in1  Recommendations for Other Services       Functional Status Assessment Patient has had a recent decline in their functional status and demonstrates the ability to make significant improvements in function in a reasonable and predictable amount of time.     Precautions / Restrictions Precautions Precautions: Fall Restrictions Weight Bearing Restrictions: No Other Position/Activity Restrictions: HOB to 30 deg      Mobility  Bed Mobility Overal bed mobility: Needs Assistance Bed Mobility: Rolling, Sidelying to Sit Rolling: Min assist Sidelying to sit: Mod assist       General bed mobility comments: Log roll training with min to mod A for BLE and trunk control    Transfers Overall transfer level: Needs assistance Equipment used: Rolling walker (2 wheels) Transfers: Sit to/from Stand Sit to Stand: Min assist, From elevated surface           General transfer comment: Mod verbal cues for hand placement    Ambulation/Gait Ambulation/Gait assistance: Min guard Gait Distance (Feet): 4 Feet Assistive device: Rolling walker (2 wheels) Gait Pattern/deviations: Step-to pattern Gait velocity: decreased     General Gait Details: Max  multi-modal cues for sequencing for LLE PWB sequencing to prevent LLE buckling during  gait  Stairs            Wheelchair Mobility    Modified Rankin (Stroke Patients Only)       Balance Overall balance assessment: Needs assistance, History of Falls   Sitting balance-Leahy Scale: Good     Standing balance support: Bilateral upper extremity supported, During functional activity, Reliant on assistive device for balance Standing balance-Leahy Scale: Fair                               Pertinent Vitals/Pain Pain Assessment Pain Assessment: 0-10 Pain Score: 10-Worst pain ever Pain Location: LLE Pain Descriptors / Indicators: Sore Pain Intervention(s): Repositioned, Premedicated before session, Monitored during session    Home Living Family/patient expects to be discharged to:: Private residence Living Arrangements: Alone Available Help at Discharge: Friend(s);Available PRN/intermittently Type of Home: Apartment Home Access: Stairs to enter Entrance Stairs-Rails: Right;Left;Can reach both Entrance Stairs-Number of Steps: 4   Home Layout: One level Home Equipment: Rollator (4 wheels);Shower seat      Prior Function Prior Level of Function : Independent/Modified Independent;History of Falls (last six months)             Mobility Comments: Ind amb without an AD community distances prior to several days ago when his LLE became weak causing 8 falls in the last 2 days.  No other fall history prior to recent falls.  Pt does not drive, friends assist driving/errands ADLs Comments: Ind with ADLs     Hand Dominance   Dominant Hand: Right    Extremity/Trunk Assessment   Upper Extremity Assessment Upper Extremity Assessment: Overall WFL for tasks assessed    Lower Extremity Assessment Lower Extremity Assessment: Generalized weakness;LLE deficits/detail LLE Deficits / Details: L knee flex strength 3+/5, L knee ext 2/5, unable to test hip flex strength secondary to pain       Communication   Communication: No difficulties  Cognition  Arousal/Alertness: Awake/alert Behavior During Therapy: WFL for tasks assessed/performed Overall Cognitive Status: Within Functional Limits for tasks assessed                                          General Comments      Exercises Total Joint Exercises Ankle Circles/Pumps: AROM, Strengthening, Both, 5 reps, 10 reps Quad Sets: Strengthening, Both, 10 reps Gluteal Sets: Strengthening, Both, 10 reps Heel Slides: AAROM, Strengthening, AROM, Both, 5 reps (AAROM on the LLE) Long Arc Quad: AROM, Strengthening, Both, 10 reps, 5 reps (small amplitude on the LLE) Knee Flexion: AROM, Strengthening, Both, 5 reps, 10 reps Other Exercises Other Exercises: HEP education for BLE APs, QS, GS, and LAQs x 10 each every 1-2 hours daily   Assessment/Plan    PT Assessment Patient needs continued PT services  PT Problem List Decreased strength;Decreased activity tolerance;Decreased balance;Decreased mobility;Decreased knowledge of use of DME;Decreased safety awareness;Pain       PT Treatment Interventions Gait training;DME instruction;Stair training;Functional mobility training;Therapeutic activities;Therapeutic exercise;Balance training;Patient/family education    PT Goals (Current goals can be found in the Care Plan section)  Acute Rehab PT Goals Patient Stated Goal: To walk better PT Goal Formulation: With patient Time For Goal Achievement: 08/29/22 Potential to Achieve Goals: Good    Frequency Min 3X/week  Co-evaluation PT/OT/SLP Co-Evaluation/Treatment: Yes Reason for Co-Treatment: For patient/therapist safety;To address functional/ADL transfers PT goals addressed during session: Mobility/safety with mobility;Strengthening/ROM         AM-PAC PT "6 Clicks" Mobility  Outcome Measure Help needed turning from your back to your side while in a flat bed without using bedrails?: A Little Help needed moving from lying on your back to sitting on the side of a flat bed  without using bedrails?: A Lot Help needed moving to and from a bed to a chair (including a wheelchair)?: A Lot Help needed standing up from a chair using your arms (e.g., wheelchair or bedside chair)?: A Lot Help needed to walk in hospital room?: A Lot Help needed climbing 3-5 steps with a railing? : Total 6 Click Score: 12    End of Session Equipment Utilized During Treatment: Gait belt Activity Tolerance: Patient tolerated treatment well Patient left: in chair;with call bell/phone within reach;with chair alarm set;with nursing/sitter in room Nurse Communication: Mobility status;Other (comment) (Nurse and CNA educated on sequencing for step-to pattern due to LLE weakness) PT Visit Diagnosis: History of falling (Z91.81);Unsteadiness on feet (R26.81);Muscle weakness (generalized) (M62.81);Difficulty in walking, not elsewhere classified (R26.2);Pain Pain - Right/Left: Left Pain - part of body: Leg    Time: 1610-9604 PT Time Calculation (min) (ACUTE ONLY): 43 min   Charges:   PT Evaluation $PT Eval Moderate Complexity: 1 Mod PT Treatments $Therapeutic Exercise: 8-22 mins      D. Elly Modena PT, DPT 08/16/22, 4:53 PM

## 2022-08-16 NOTE — Assessment & Plan Note (Addendum)
Blood pressure stable.  Continue propranolol and amlodipine

## 2022-08-16 NOTE — ED Provider Notes (Signed)
[facility] Provider Note    Event Date/Time   First MD Initiated Contact with Patient 08/16/22 1143     (approximate)   History   Fall   HPI  Christopher Mason is a 62 y.o. male with history of hypertension, asthma, COPD, schizophrenia who comes in with concerns for falls.  On review of records patient was seen on 4/21 for leg pain and had a negative x-ray given Toradol and was ambulatory so went home.  Patient comes in today for multiple falls that occurred yesterday.  He reports that his left leg is giving out.  He is initially hypotensive with EMS requiring 300 mL of fluid   Patient reports he has been taking all of his medications he reports not taking anything for pain at home.  He reports multiple falls yesterday where he is needed to call EMS to get help up as well as have a caregiver help him get up.  He states that he falls because his left leg is giving out on him.  He reports having pain shooting down from his back down to the back of his leg.  He reports being nonambulatory at home.  He denies hitting his head.  He denies any chest pain but does report a little bit of worsening shortness of breath but does report still smoking.   Physical Exam   Triage Vital Signs: ED Triage Vitals  Enc Vitals Group     BP 08/16/22 1131 (!) 89/69     Pulse Rate 08/16/22 1138 95     Resp 08/16/22 1131 20     Temp 08/16/22 1128 98.5 F (36.9 C)     Temp src --      SpO2 08/16/22 1138 93 %     Weight 08/16/22 1130 200 lb (90.7 kg)     Height 08/16/22 1130  (1.702 m)     Head Circumference --      Peak Flow --      Pain Score 08/16/22 1129 10     Pain Loc --      Pain Edu? --      Excl. in GC? --     Most recent vital signs: Vitals:   08/16/22 1131 08/16/22 1138  BP: (!) 89/69   Pulse:  95  Resp: 20   Temp:    SpO2:  93%     General: Awake, no distress.  CV:  Good peripheral perfusion.  Resp:  Normal effort.  No wheezing noted Abd:  No  distention.  Other:  No swelling in legs.  Good distal pulse.  He reports prior contracture of his ankle that he typically needs to wear a brace for so he has got baseline inability to flex and extend the ankle.  He has low L-spine tenderness but no CT spine tenderness.   ED Results / Procedures / Treatments   Labs (all labs ordered are listed, but only abnormal results are displayed) Labs Reviewed  CBC - Abnormal; Notable for the following components:      Result Value   Hemoglobin 12.7 (*)    All other components within normal limits  BASIC METABOLIC PANEL     EKG  My interpretation of EKG:  Normal sinus rhythm 94 without ST elevation, T wave inversion in V3, right bundle branch block  RADIOLOGY I have reviewed the xray personally and reported no obvious fracture   PROCEDURES:  Critical Care performed: No  .1-3 Lead EKG Interpretation  Performed by: Concha Se, MD Authorized by: Concha Se, MD     Interpretation: normal     ECG rate:  90   ECG rate assessment: normal     Rhythm: sinus rhythm     Ectopy: none     Conduction: normal      MEDICATIONS ORDERED IN ED: Medications  sodium chloride 0.9 % bolus 500 mL (500 mLs Intravenous New Bag/Given 08/16/22 1138)     IMPRESSION / MDM / ASSESSMENT AND PLAN / ED COURSE  I reviewed the triage vital signs and the nursing notes.   Patient's presentation is most consistent with acute presentation with potential threat to life or bodily function.   Patient comes in with hypotension, multiple falls.  No obvious evidence of trauma on examination does have some small abrasions on the knee.  Will get basic labs to evaluate for electrolytes, AKI.  Will get x-rays to evaluate for any fractures of the left leg.  Patient given some fluids for his low blood pressure.  CBC is reassuring.  BMP shows elevated creatinine of 1.79.  BNP is normal.  X-ray looks reassuring.  His x-ray was concerning for potentially developing  pneumonia.  Discussed with patient and he does report having increasing cough shortness of breath.  No swelling in legs no calf tenderness.  Patient is not septic but will start on antibiotics and given AKI hypotension will discuss with hospital team for admission.  Given his difficulty with ambulation I will add on MRI lumbar, MRI hip to further evaluate this with the inpatient team to follow-up on.  No obvious evidence of cord compression at this time.  12:53 PM I reevaluated patient and repalpated his head no signs of any trauma.  He again declines that he hit his head denies any neck tenderness.  He reports that his only issue is the low back pain and the left hip pain.  The patient is on the cardiac monitor to evaluate for evidence of arrhythmia and/or significant heart rate changes.      FINAL CLINICAL IMPRESSION(S) / ED DIAGNOSES   Final diagnoses:  AKI (acute kidney injury)  Fall, initial encounter  Pneumonia of left lower lobe due to infectious organism     Rx / DC Orders   ED Discharge Orders     None        Note:  This document was prepared using Dragon voice recognition software and may include unintentional dictation errors.   Concha Se, MD 08/16/22 1254

## 2022-08-17 ENCOUNTER — Ambulatory Visit: Payer: Medicaid Other | Admitting: Plastic Surgery

## 2022-08-17 DIAGNOSIS — N179 Acute kidney failure, unspecified: Secondary | ICD-10-CM | POA: Diagnosis present

## 2022-08-17 DIAGNOSIS — I10 Essential (primary) hypertension: Secondary | ICD-10-CM | POA: Diagnosis present

## 2022-08-17 DIAGNOSIS — F209 Schizophrenia, unspecified: Secondary | ICD-10-CM | POA: Diagnosis present

## 2022-08-17 DIAGNOSIS — G8929 Other chronic pain: Secondary | ICD-10-CM | POA: Diagnosis present

## 2022-08-17 DIAGNOSIS — F319 Bipolar disorder, unspecified: Secondary | ICD-10-CM | POA: Diagnosis present

## 2022-08-17 DIAGNOSIS — G4700 Insomnia, unspecified: Secondary | ICD-10-CM | POA: Diagnosis not present

## 2022-08-17 DIAGNOSIS — M5416 Radiculopathy, lumbar region: Secondary | ICD-10-CM

## 2022-08-17 DIAGNOSIS — J449 Chronic obstructive pulmonary disease, unspecified: Secondary | ICD-10-CM | POA: Diagnosis not present

## 2022-08-17 DIAGNOSIS — J189 Pneumonia, unspecified organism: Secondary | ICD-10-CM | POA: Diagnosis present

## 2022-08-17 DIAGNOSIS — R29898 Other symptoms and signs involving the musculoskeletal system: Secondary | ICD-10-CM | POA: Diagnosis not present

## 2022-08-17 DIAGNOSIS — E039 Hypothyroidism, unspecified: Secondary | ICD-10-CM | POA: Diagnosis present

## 2022-08-17 DIAGNOSIS — E860 Dehydration: Secondary | ICD-10-CM | POA: Diagnosis present

## 2022-08-17 DIAGNOSIS — W19XXXA Unspecified fall, initial encounter: Secondary | ICD-10-CM | POA: Diagnosis present

## 2022-08-17 DIAGNOSIS — N400 Enlarged prostate without lower urinary tract symptoms: Secondary | ICD-10-CM | POA: Diagnosis present

## 2022-08-17 DIAGNOSIS — D509 Iron deficiency anemia, unspecified: Secondary | ICD-10-CM | POA: Diagnosis not present

## 2022-08-17 DIAGNOSIS — E871 Hypo-osmolality and hyponatremia: Secondary | ICD-10-CM | POA: Diagnosis not present

## 2022-08-17 DIAGNOSIS — F1210 Cannabis abuse, uncomplicated: Secondary | ICD-10-CM | POA: Diagnosis present

## 2022-08-17 DIAGNOSIS — Z1152 Encounter for screening for COVID-19: Secondary | ICD-10-CM | POA: Diagnosis not present

## 2022-08-17 DIAGNOSIS — J9601 Acute respiratory failure with hypoxia: Secondary | ICD-10-CM | POA: Diagnosis not present

## 2022-08-17 DIAGNOSIS — M5116 Intervertebral disc disorders with radiculopathy, lumbar region: Secondary | ICD-10-CM | POA: Diagnosis not present

## 2022-08-17 DIAGNOSIS — E669 Obesity, unspecified: Secondary | ICD-10-CM | POA: Diagnosis present

## 2022-08-17 DIAGNOSIS — I5031 Acute diastolic (congestive) heart failure: Secondary | ICD-10-CM | POA: Diagnosis not present

## 2022-08-17 DIAGNOSIS — M25562 Pain in left knee: Secondary | ICD-10-CM | POA: Diagnosis present

## 2022-08-17 DIAGNOSIS — J441 Chronic obstructive pulmonary disease with (acute) exacerbation: Secondary | ICD-10-CM | POA: Diagnosis present

## 2022-08-17 DIAGNOSIS — E785 Hyperlipidemia, unspecified: Secondary | ICD-10-CM | POA: Diagnosis present

## 2022-08-17 DIAGNOSIS — J9811 Atelectasis: Secondary | ICD-10-CM | POA: Diagnosis present

## 2022-08-17 DIAGNOSIS — G2401 Drug induced subacute dyskinesia: Secondary | ICD-10-CM | POA: Diagnosis present

## 2022-08-17 DIAGNOSIS — J440 Chronic obstructive pulmonary disease with acute lower respiratory infection: Secondary | ICD-10-CM | POA: Diagnosis present

## 2022-08-17 DIAGNOSIS — Z23 Encounter for immunization: Secondary | ICD-10-CM | POA: Diagnosis not present

## 2022-08-17 DIAGNOSIS — F17210 Nicotine dependence, cigarettes, uncomplicated: Secondary | ICD-10-CM | POA: Diagnosis present

## 2022-08-17 LAB — CK: Total CK: 707 U/L — ABNORMAL HIGH (ref 49–397)

## 2022-08-17 LAB — CBC WITH DIFFERENTIAL/PLATELET
Abs Immature Granulocytes: 0.04 10*3/uL (ref 0.00–0.07)
Basophils Absolute: 0 10*3/uL (ref 0.0–0.1)
Basophils Relative: 0 %
Eosinophils Absolute: 0 10*3/uL (ref 0.0–0.5)
Eosinophils Relative: 0 %
HCT: 34.6 % — ABNORMAL LOW (ref 39.0–52.0)
Hemoglobin: 10.8 g/dL — ABNORMAL LOW (ref 13.0–17.0)
Immature Granulocytes: 1 %
Lymphocytes Relative: 9 %
Lymphs Abs: 0.8 10*3/uL (ref 0.7–4.0)
MCH: 29 pg (ref 26.0–34.0)
MCHC: 31.2 g/dL (ref 30.0–36.0)
MCV: 93 fL (ref 80.0–100.0)
Monocytes Absolute: 0.6 10*3/uL (ref 0.1–1.0)
Monocytes Relative: 7 %
Neutro Abs: 6.7 10*3/uL (ref 1.7–7.7)
Neutrophils Relative %: 83 %
Platelets: 177 10*3/uL (ref 150–400)
RBC: 3.72 MIL/uL — ABNORMAL LOW (ref 4.22–5.81)
RDW: 14.5 % (ref 11.5–15.5)
WBC: 8.1 10*3/uL (ref 4.0–10.5)
nRBC: 0 % (ref 0.0–0.2)

## 2022-08-17 LAB — BASIC METABOLIC PANEL
Anion gap: 6 (ref 5–15)
BUN: 19 mg/dL (ref 8–23)
CO2: 24 mmol/L (ref 22–32)
Calcium: 8.9 mg/dL (ref 8.9–10.3)
Chloride: 104 mmol/L (ref 98–111)
Creatinine, Ser: 1.1 mg/dL (ref 0.61–1.24)
GFR, Estimated: >60 mL/min (ref >60)
Glucose, Bld: 141 mg/dL — ABNORMAL HIGH (ref 70–99)
Potassium: 4.4 mmol/L (ref 3.5–5.1)
Sodium: 134 mmol/L — ABNORMAL LOW (ref 135–145)

## 2022-08-17 LAB — PROTIME-INR
INR: 1 (ref 0.8–1.2)
Prothrombin Time: 13.5 seconds (ref 11.4–15.2)

## 2022-08-17 LAB — HIV ANTIBODY (ROUTINE TESTING W REFLEX): HIV Screen 4th Generation wRfx: NONREACTIVE

## 2022-08-17 MED ORDER — LEVOFLOXACIN 500 MG PO TABS
500.0000 mg | ORAL_TABLET | Freq: Every day | ORAL | Status: DC
  Administered 2022-08-18 – 2022-08-20 (×3): 500 mg via ORAL
  Filled 2022-08-17 (×3): qty 1

## 2022-08-17 MED ORDER — PALIPERIDONE ER 3 MG PO TB24
9.0000 mg | ORAL_TABLET | Freq: Every morning | ORAL | Status: DC
  Administered 2022-08-18: 9 mg via ORAL
  Filled 2022-08-17: qty 3

## 2022-08-17 MED ORDER — ENOXAPARIN SODIUM 60 MG/0.6ML IJ SOSY
0.5000 mg/kg | PREFILLED_SYRINGE | INTRAMUSCULAR | Status: DC
  Administered 2022-08-18 – 2022-08-31 (×14): 45 mg via SUBCUTANEOUS
  Filled 2022-08-17 (×14): qty 0.6

## 2022-08-17 MED ORDER — HYDROCODONE-ACETAMINOPHEN 5-325 MG PO TABS
1.0000 | ORAL_TABLET | Freq: Four times a day (QID) | ORAL | Status: DC | PRN
  Administered 2022-08-17 – 2022-09-18 (×54): 1 via ORAL
  Filled 2022-08-17 (×56): qty 1

## 2022-08-17 MED ORDER — IPRATROPIUM-ALBUTEROL 0.5-2.5 (3) MG/3ML IN SOLN
3.0000 mL | Freq: Three times a day (TID) | RESPIRATORY_TRACT | Status: DC
  Administered 2022-08-17 – 2022-08-20 (×8): 3 mL via RESPIRATORY_TRACT
  Filled 2022-08-17 (×8): qty 3

## 2022-08-17 NOTE — Plan of Care (Signed)

## 2022-08-17 NOTE — TOC Progression Note (Addendum)
Transition of Care [facility]) - Progression Note    Patient Details  Name: Christopher Mason MRN: [id number] Date of Birth: 05-Nov-1960  [facility] of [facility] CM/SW Contact  Marlowe Sax, RN Phone Number: 08/17/2022, 1:31 PM  Clinical Narrative:    Met with the patient in the room, he is on Oxygen acute and  does not use at home, he lives alone, he has a rolling walker and shower seat at home, he stated that he does not think he needs a BSC He was getting up with Nursing staff and able to move around with assistance and walked to the door and back He is open with [facility] and [name] came to the hospital to see him I attempted to call [name] with [facility] and left a VM asking for a call back, [name] with [facility] left a message with nursing staff that they can help with Home health set up  I explained to the patient that to go to [facility] he would have to agree to stay for 30 days and sign over his monthly check to th [facility], he said that he is not going to do that, he is not able to sign over his check as he uses it for bills.  His friend provides transportation for him using his vehicle    Barriers to Discharge: Continued Medical Work up  Expected Discharge Plan and Services   Discharge Planning Services: CM Consult                     DME Arranged: N/A DME Agency: NA                   Social Determinants of Health (SDOH) Interventions SDOH Screenings   Food Insecurity: No Food Insecurity (08/16/2022)  Housing: Low Risk  (08/16/2022)  Transportation Needs: No Transportation Needs (08/16/2022)  Utilities: Not At Risk (08/16/2022)  Alcohol Screen: Low Risk  (09/06/2021)  Depression (PHQ2-9): Low Risk  (09/06/2021)  Tobacco Use: High Risk (08/16/2022)    Readmission Risk Interventions     No data to display
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
## 2022-08-17 NOTE — Progress Notes (Addendum)
Tele called stating that patient had oxygen desaturations in the mid 80s. Went to assess patient and he had no sob but was asleep. Rechecked with dynamap and got 92% on 3L [place]. Looked at unit tele monitor and it showed 91% on 3L [place]. Patient seems to desat for temporary periods while asleep but is not symptomatic.

## 2022-08-17 NOTE — Progress Notes (Signed)
Physical Therapy Treatment Patient Details Name: Christopher Mason MRN: [id number] DOB: Jun 24, 1960 Today's Date: 08/17/2022   History of Present Illness Pt is a 62 y.o. male with medical history significant of COPD, schizophrenia, previous spinal surgery, hypothyroidism, hypothyroidism, bipolar disorder, tardive dyskinesia, who presents to the ED with complaints of falls.  MD assessment includes: COPD exacerbation, CAP, AKI, and LLE weakness. MRI impression includes: multilevel lumbar spondylosis, worst at L3-4, where a left subarticular disc extrusion results in compression of the traversing left L4 nerve root in the left lateral recess.  Moderate left neural foraminal narrowing at this level, displacement of the traversing left L3 nerve root in the left lateral recess at L2-3, and severe bilateral neural foraminal narrowing at L5-S1.    PT Comments    Pt received in recliner. Discussed role of PT, current functional level, and goals. Pt's advocate in room. Discussed benefits of STR due to current weakness and L LE knee buckling (may benefit from hinge locking knee brace prior to d/c home) Pt also has ~5 steep steps with single rail to enter home. Pt however did endorse back pain was much better today, able to demonstrate short distance gait in room with heavy reliance on RW and CGA. Antalgic with decreased weight acceptance through L LE, remaining high risk for falls and continues to be a good candidate for short term rehab prior to returning home with [facility] services.   Recommendations for follow up therapy are one component of a multi-disciplinary discharge planning process, led by the attending physician.  Recommendations may be updated based on patient status, additional functional criteria and insurance authorization.  Follow Up Recommendations  Can patient physically be transported by private vehicle: No    Assistance Recommended at Discharge Frequent or constant Supervision/Assistance   Patient can return home with the following A lot of help with walking and/or transfers;A lot of help with bathing/dressing/bathroom;Assistance with cooking/housework;Direct supervision/assist for medications management;Assist for transportation;Help with stairs or ramp for entrance   Equipment Recommendations  Rolling walker (2 wheels);BSC/3in1    Recommendations for Other Services       Precautions / Restrictions Precautions Precautions: Fall Restrictions Weight Bearing Restrictions: No     Mobility  Bed Mobility               General bed mobility comments:  (Pt in recliner pre/post session)    Transfers Overall transfer level: Needs assistance Equipment used: Rolling walker (2 wheels) Transfers: Sit to/from Stand Sit to Stand: Min assist           General transfer comment:  (Able to stand on second attempt from low chair)    Ambulation/Gait Ambulation/Gait assistance: Min guard Gait Distance (Feet):  (40) Assistive device: Rolling walker (2 wheels) Gait Pattern/deviations: Step-through pattern, Decreased stance time - left, Antalgic Gait velocity: decreased     General Gait Details:  (High risk for buckling L LE, only able to tolerate short distance with heavy reliance on RW for support)   Stairs Stairs: Yes       General stair comments:  (Pt has ~5 steep steps with rail on L to enter home)   Wheelchair Mobility    Modified Rankin (Stroke Patients Only)       Balance Overall balance assessment: Needs assistance, History of Falls Sitting-balance support: Feet supported Sitting balance-Leahy Scale: Good     Standing balance support: Bilateral upper extremity supported, During functional activity, Reliant on assistive device for balance Standing balance-Leahy Scale: Fair Standing balance comment:  (  High fall risk due to L knee buckling if pt is not attentive during walking)                            Cognition  Arousal/Alertness: Awake/alert Behavior During Therapy: WFL for tasks assessed/performed Overall Cognitive Status: Within Functional Limits for tasks assessed                                 General Comments:  (Very pleasant and cooperative)        Exercises Total Joint Exercises Ankle Circles/Pumps: AROM, Strengthening, Both, 5 reps, 10 reps Long Arc Quad: AROM, Strengthening, Both, 10 reps, 5 reps    General Comments General comments (skin integrity, edema, etc.):  (Pt educated on role of PT. Discussed benefits of short term rehab with pt and advocate in room.)      Pertinent Vitals/Pain Pain Assessment Pain Assessment: Faces Faces Pain Scale: Hurts little more Pain Location: LLE Pain Descriptors / Indicators: Sore Pain Intervention(s): Limited activity within patient's tolerance (Painful with weight bearing)    Home Living                          Prior Function            PT Goals (current goals can now be found in the care plan section) Acute Rehab PT Goals Patient Stated Goal: To walk better    Frequency    Min 3X/week      PT Plan      Co-evaluation              AM-PAC PT "6 Clicks" Mobility   Outcome Measure  Help needed turning from your back to your side while in a flat bed without using bedrails?: A Little Help needed moving from lying on your back to sitting on the side of a flat bed without using bedrails?: A Lot Help needed moving to and from a bed to a chair (including a wheelchair)?: A Lot Help needed standing up from a chair using your arms (e.g., wheelchair or bedside chair)?: A Lot Help needed to walk in hospital room?: A Lot Help needed climbing 3-5 steps with a railing? : Total 6 Click Score: 12    End of Session Equipment Utilized During Treatment: Gait belt Activity Tolerance: Patient tolerated treatment well Patient left: in chair;with call bell/phone within reach;with chair alarm set;with  nursing/sitter in room Nurse Communication: Mobility status PT Visit Diagnosis: History of falling (Z91.81);Unsteadiness on feet (R26.81);Muscle weakness (generalized) (M62.81);Difficulty in walking, not elsewhere classified (R26.2);Pain Pain - Right/Left: Left Pain - part of body: Leg     Time: 1251-1315 PT Time Calculation (min) (ACUTE ONLY): 24 min  Charges:  $Gait Training: 8-22 mins $Therapeutic Exercise: 8-22 mins                    Zadie Cleverly, PTA  Jannet Askew 08/17/2022, 4:14 PM
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
## 2022-08-17 NOTE — Progress Notes (Signed)
  PROGRESS NOTE    Christopher Mason  WUJ:[id number] DOB: Dec 22, 1960 DOA: 08/16/2022 PCP: Sallyanne Kuster, NP  [id number]  LOS: 0 days   Brief hospital course:   Assessment & Plan: Christopher Mason is a 62 y.o. male with medical history significant of COPD, schizophrenia, previous spinal surgery, hypothyroidism, hypothyroidism, bipolar disorder, tardive dyskinesia, who presents to the ED with complaints of a fall.   He notes that his left leg weakness and numbness started as left hip pain a few days ago. He has had multiple falls due to the weakness. He denies any other focal weakness. He endorses increased cough over the last few days in addition to increased shortness of breath.    * COPD exacerbation (HCC) Patient presenting with increased shortness of breath and cough over the last few days.  Required 2 L of supplemental oxygen due to desaturation after Dilaudid. --back on room air today Plan: --cont prednisone 40 mg daily --DuoNeb scheduled  Possible CAP --no fever, leukocytosis, procal neg.  CXR read as "patchy airspace disease at the left base suggesting pneumonia."  Personal review did not see obvious consolidation. --received 2 days of ceftriaxone and azithromycin --cont abx as Levaquin  Left leg weakness 2/2 left-sided lumbar radiculopathy due to disc herniation at L3/4 with possible involvement at L2-3 as well --finding per MRI --neurosurgery consulted, rec epidural steroid injection by IR.  AKI (acute kidney injury) (HCC) --Cr 1.79 on presentation.  Baseline around 1.  Cr back to baseline the next day after IVF.  Schizophrenia (HCC) - Continue home amitriptyline, Invega and venlafaxine  HTN (hypertension) Patient has a history of hypertension currently on amlodipine and lisinopril.  Soft blood pressure noted by EMS with improvement with IV fluids. - Hold home antihypertensives    DVT prophylaxis: Lovenox SQ Code Status: Full code  Family Communication:   Level of care: Med-Surg Dispo:   The patient is from: home Anticipated d/c is to: SNF rehab Anticipated d/c date is: 1-2 days   Subjective and Interval History:  Pt reported breathing improved.  Still having weakness in left leg.   Objective: Vitals:   08/17/22 0755 08/17/22 0756 08/17/22 0758 08/17/22 1541  BP:  114/66 114/80 (!) 133/90  Pulse:  83 83 79  Resp:   17 18  Temp:   98 F (36.7 C) 98.3 F (36.8 C)  TempSrc:      SpO2: 93% 90% 90% 94%  Weight:      Height:        Intake/Output Summary (Last 24 hours) at 08/17/2022 1837 Last data filed at 08/17/2022 1330 Gross per 24 hour  Intake 1440 ml  Output 590 ml  Net 850 ml   Filed Weights   08/16/22 1130  Weight: 90.7 kg    Examination:   Constitutional: NAD, AAOx3, sitting in recliner HEENT: conjunctivae and lids normal, EOMI CV: No cyanosis.   RESP: normal respiratory effort, mild wheezes, on RA Neuro: II - XII grossly intact.   Psych: Normal mood and affect.  Appropriate judgement and reason   Data Reviewed: I have personally reviewed labs and imaging studies  Time spent: 50 minutes  Darlin Priestly, MD [facility] If 7PM-7AM, please contact night-coverage 08/17/2022, 6:37 PM

## 2022-08-18 ENCOUNTER — Inpatient Hospital Stay: Payer: Medicaid Other | Admitting: Radiology

## 2022-08-18 DIAGNOSIS — J441 Chronic obstructive pulmonary disease with (acute) exacerbation: Secondary | ICD-10-CM | POA: Diagnosis not present

## 2022-08-18 HISTORY — PX: IR INJECT/THERA/INC NEEDLE/CATH/PLC EPI/LUMB/SAC W/IMG: IMG6130

## 2022-08-18 LAB — CBC
HCT: 35 % — ABNORMAL LOW (ref 39.0–52.0)
Hemoglobin: 10.8 g/dL — ABNORMAL LOW (ref 13.0–17.0)
MCH: 28.8 pg (ref 26.0–34.0)
MCHC: 30.9 g/dL (ref 30.0–36.0)
MCV: 93.3 fL (ref 80.0–100.0)
Platelets: 182 10*3/uL (ref 150–400)
RBC: 3.75 MIL/uL — ABNORMAL LOW (ref 4.22–5.81)
RDW: 14.6 % (ref 11.5–15.5)
WBC: 9.6 10*3/uL (ref 4.0–10.5)
nRBC: 0 % (ref 0.0–0.2)

## 2022-08-18 LAB — BASIC METABOLIC PANEL
Anion gap: 6 (ref 5–15)
BUN: 18 mg/dL (ref 8–23)
CO2: 28 mmol/L (ref 22–32)
Calcium: 9.2 mg/dL (ref 8.9–10.3)
Chloride: 104 mmol/L (ref 98–111)
Creatinine, Ser: 0.85 mg/dL (ref 0.61–1.24)
GFR, Estimated: >60 mL/min (ref >60)
Glucose, Bld: 93 mg/dL (ref 70–99)
Potassium: 4.2 mmol/L (ref 3.5–5.1)
Sodium: 138 mmol/L (ref 135–145)

## 2022-08-18 LAB — MAGNESIUM: Magnesium: 1.9 mg/dL (ref 1.7–2.4)

## 2022-08-18 MED ORDER — IOHEXOL 180 MG/ML  SOLN
2.0000 mL | Freq: Once | INTRAMUSCULAR | Status: AC | PRN
  Administered 2022-08-18: 2 mL via EPIDURAL

## 2022-08-18 MED ORDER — LIDOCAINE HCL (PF) 1 % IJ SOLN
3.0000 mL | Freq: Once | INTRAMUSCULAR | Status: AC
  Administered 2022-08-18: 3 mL
  Filled 2022-08-18: qty 4

## 2022-08-18 MED ORDER — TAMSULOSIN HCL 0.4 MG PO CAPS
0.4000 mg | ORAL_CAPSULE | Freq: Every day | ORAL | Status: DC
  Administered 2022-08-18 – 2022-09-20 (×33): 0.4 mg via ORAL
  Filled 2022-08-18 (×33): qty 1

## 2022-08-18 MED ORDER — METHYLPREDNISOLONE ACETATE 80 MG/ML IJ SUSP
INTRAMUSCULAR | Status: AC
  Filled 2022-08-18: qty 1

## 2022-08-18 MED ORDER — SODIUM CHLORIDE (PF) 0.9 % IJ SOLN
INTRAMUSCULAR | Status: AC
  Filled 2022-08-18: qty 10

## 2022-08-18 MED ORDER — LIDOCAINE HCL (PF) 1 % IJ SOLN
INTRAMUSCULAR | Status: AC
  Filled 2022-08-18: qty 30

## 2022-08-18 MED ORDER — METHYLPREDNISOLONE ACETATE 40 MG/ML INJ SUSP (RADIOLOG
80.0000 mg | Freq: Once | INTRAMUSCULAR | Status: AC
  Administered 2022-08-18: 80 mg via EPIDURAL

## 2022-08-18 MED ORDER — FESOTERODINE FUMARATE ER 4 MG PO TB24
4.0000 mg | ORAL_TABLET | Freq: Every day | ORAL | Status: DC
  Administered 2022-08-18 – 2022-09-04 (×18): 4 mg via ORAL
  Filled 2022-08-18 (×18): qty 1

## 2022-08-18 MED ORDER — NICOTINE 21 MG/24HR TD PT24
21.0000 mg | MEDICATED_PATCH | Freq: Every day | TRANSDERMAL | Status: DC
  Administered 2022-08-18 – 2022-09-20 (×33): 21 mg via TRANSDERMAL
  Filled 2022-08-18 (×33): qty 1

## 2022-08-18 NOTE — Plan of Care (Signed)

## 2022-08-18 NOTE — TOC Progression Note (Signed)
Transition of Care [facility]) - Progression Note    Patient Details  Name: Gregroy Dombkowski MRN: [id number] Date of Birth: 05-23-1960  Transition of Care [facility]) CM/SW Contact  Marlowe Sax, RN Phone Number: 08/18/2022, 2:52 PM  Clinical Narrative:    Spoke with [name] at [facility] and explained the patient needs to go to STR and he is not able to due to needing to sign over his check monthly I explained that the patient stated he uses his check to pay his bills, He stated that he will speak to the nurses with the [facility] agency and see what they may do, he stated that they can sometimes go daily to see the patient but they do not have the resources to stay with the patient for any amount of time, I explained that the patient stated that he does not have friends that can help him other than with transportation [name] stated that he would try to speak with his team and see what they can do, I explained that the patient is refusing rehab due to the having to stay 30 day and sign over check therefore he will have to go home,   He stated he would call me back     Barriers to Discharge: Continued Medical Work up  Expected Discharge Plan and Services   Discharge Planning Services: CM Consult                     DME Arranged: N/A DME Agency: NA                   Social Determinants of Health (SDOH) Interventions SDOH Screenings   Food Insecurity: No Food Insecurity (08/16/2022)  Housing: Low Risk  (08/16/2022)  Transportation Needs: No Transportation Needs (08/16/2022)  Utilities: Not At Risk (08/16/2022)  Alcohol Screen: Low Risk  (09/06/2021)  Depression (PHQ2-9): Low Risk  (09/06/2021)  Tobacco Use: High Risk (08/18/2022)    Readmission Risk Interventions     No data to display

## 2022-08-18 NOTE — Progress Notes (Signed)
PROGRESS NOTE    Christopher Mason  ZOX:[id number] DOB: Sep 22, 1960 DOA: 08/16/2022 PCP: Sallyanne Kuster, NP  [id number]  LOS: 1 day   Brief hospital course:   Assessment & Plan: Christopher Mason is a 62 y.o. male with medical history significant of COPD, schizophrenia, previous spinal surgery, hypothyroidism, hypothyroidism, bipolar disorder, tardive dyskinesia, who presents to the ED with complaints of a fall.   He notes that his left leg weakness and numbness started as left hip pain a few days ago. He has had multiple falls due to the weakness. He denies any other focal weakness. He endorses increased cough over the last few days in addition to increased shortness of breath.    * COPD exacerbation (HCC) Patient presenting with increased shortness of breath and cough over the last few days.  Required 2 L of supplemental oxygen due to desaturation after Dilaudid. --likely has sleep apnea. Plan: --cont prednisone 40 mg daily --DuoNeb scheduled  Possible CAP --no fever, leukocytosis, procal neg.  CXR read as "patchy airspace disease at the left base suggesting pneumonia."  Personal review did not see obvious consolidation. --received 2 days of ceftriaxone and azithromycin --cont abx as Levaquin  Left leg weakness 2/2 left-sided lumbar radiculopathy due to disc herniation at L3/4 with possible involvement at L2-3 as well --finding per MRI --neurosurgery consulted, currently not a surgical candidate due to COPD exacerbation. --rec epidural steroid injection by IR to improve pain (will not help with weakness)  AKI (acute kidney injury) (HCC) --Cr 1.79 on presentation.  Baseline around 1.  Cr back to baseline the next day after IVF.  Schizophrenia (HCC) - Continue home amitriptyline, and venlafaxine --cont home monthly Invega, last given 4/19   HTN (hypertension) Patient has a history of hypertension currently on amlodipine and lisinopril.  Soft blood pressure noted by  EMS with improvement with IV fluids. - Hold home antihypertensives    DVT prophylaxis: Lovenox SQ Code Status: Full code  Family Communication:  Level of care: Med-Surg Dispo:   The patient is from: home Anticipated d/c is to: SNF rehab Anticipated d/c date is: 1-2 days   Subjective and Interval History:  Pt reported swelling in his right foot (which did not appear swollen), and then reported pain in right foot.  Reported breathing improved.   Objective: Vitals:   08/17/22 2142 08/17/22 2346 08/18/22 0758 08/18/22 0818  BP: (!) 153/88 (!) 145/92  (!) 134/94  Pulse: 85 83  79  Resp: 18 20  20   Temp:  99.1 F (37.3 C)  98.7 F (37.1 C)  TempSrc:  Oral    SpO2: 90% 90% (!) 88% 91%  Weight:      Height:        Intake/Output Summary (Last 24 hours) at 08/18/2022 1254 Last data filed at 08/18/2022 0850 Gross per 24 hour  Intake 360 ml  Output 1450 ml  Net -1090 ml   Filed Weights   08/16/22 1130  Weight: 90.7 kg    Examination:   Constitutional: NAD, alert, oriented to person and place HEENT: conjunctivae and lids normal, EOMI CV: No cyanosis.   RESP: normal respiratory effort, on RA Extremities: No obvious effusions, edema in BLE SKIN: warm, dry Neuro: II - XII grossly intact.   Psych: Normal mood and affect.  Appropriate judgement and reason   Data Reviewed: I have personally reviewed labs and imaging studies  Time spent: 35 minutes  Darlin Priestly, MD [facility] If 7PM-7AM, please contact night-coverage 08/18/2022, 12:54  PM  

## 2022-08-19 DIAGNOSIS — J441 Chronic obstructive pulmonary disease with (acute) exacerbation: Secondary | ICD-10-CM | POA: Diagnosis not present

## 2022-08-19 MED ORDER — POLYETHYLENE GLYCOL 3350 17 G PO PACK
34.0000 g | PACK | Freq: Two times a day (BID) | ORAL | Status: DC
  Administered 2022-08-19 – 2022-09-03 (×21): 34 g via ORAL
  Administered 2022-09-04: 17 g via ORAL
  Administered 2022-09-07 – 2022-09-20 (×15): 34 g via ORAL
  Filled 2022-08-19 (×61): qty 2

## 2022-08-19 MED ORDER — AMLODIPINE BESYLATE 10 MG PO TABS
10.0000 mg | ORAL_TABLET | Freq: Every day | ORAL | Status: DC
  Administered 2022-08-19 – 2022-09-20 (×32): 10 mg via ORAL
  Filled 2022-08-19 (×32): qty 1

## 2022-08-19 MED ORDER — BISACODYL 10 MG RE SUPP
10.0000 mg | Freq: Every day | RECTAL | Status: DC | PRN
  Administered 2022-08-20 – 2022-09-10 (×3): 10 mg via RECTAL
  Filled 2022-08-19 (×3): qty 1

## 2022-08-19 NOTE — Plan of Care (Signed)
  Problem: Education: Goal: Knowledge of General Education information will improve Description: Including pain rating scale, medication(s)/side effects and non-pharmacologic comfort measures Outcome: Progressing   Problem: Elimination: Goal: Will not experience complications related to bowel motility Outcome: Progressing   Problem: Activity: Goal: Risk for activity intolerance will decrease Outcome: Progressing   Problem: Pain Managment: Goal: General experience of comfort will improve Outcome: Progressing   Problem: Skin Integrity: Goal: Risk for impaired skin integrity will decrease Outcome: Progressing   Problem: Safety: Goal: Ability to remain free from injury will improve Outcome: Progressing

## 2022-08-19 NOTE — Progress Notes (Signed)
  PROGRESS NOTE    Christopher Mason  WUJ:[id number] DOB: [birth year] DOA: 08/16/2022 PCP: Sallyanne Kuster, NP  148A/[id number]-AA  LOS: 2 days   Brief hospital course:   Assessment & Plan: Christopher Mason is a 62 y.o. male with medical history significant of COPD, schizophrenia, previous spinal surgery, hypothyroidism, hypothyroidism, bipolar disorder, tardive dyskinesia, who presents to the ED with complaints of a fall.   He notes that his left leg weakness and numbness started as left hip pain a few days ago. He has had multiple falls due to the weakness. He denies any other focal weakness. He endorses increased cough over the last few days in addition to increased shortness of breath.    * COPD exacerbation (HCC) Patient presenting with increased shortness of breath and cough over the last few days.  Required 2 L of supplemental oxygen due to desaturation after Dilaudid. --likely has sleep apnea. Plan: --cont prednisone 40 mg daily --DuoNeb scheduled  Possible CAP --no fever, leukocytosis, procal neg.  CXR read as "patchy airspace disease at the left base suggesting pneumonia."  Personal review did not see obvious consolidation. --received 2 days of ceftriaxone and azithromycin --cont abx as Levaquin for total 5 day course  Left leg weakness 2/2 left-sided lumbar radiculopathy due to disc herniation at L3/4 with possible involvement at L2-3 as well S/p epidural steroid injection by IR on 4/26 --finding per MRI --neurosurgery consulted, currently not a surgical candidate due to COPD exacerbation. --PT  AKI (acute kidney injury) (HCC) --Cr 1.79 on presentation.  Baseline around 1.  Cr back to baseline the next day after IVF.  Schizophrenia (HCC) - Continue home amitriptyline, and venlafaxine --cont home monthly Invega, last given 4/19   HTN (hypertension) Patient has a history of hypertension currently on amlodipine and lisinopril.  Soft blood pressure noted by EMS with  improvement with IV fluids. --cont home propranolol --resume home amlodipine   DVT prophylaxis: Lovenox SQ Code Status: Full code  Family Communication:  Level of care: Med-Surg Dispo:   The patient is from: home Anticipated d/c is to: SNF rehab Anticipated d/c date is: whenever bed available   Subjective and Interval History:  Pt reported feeling sleepy today.  Said he couldn't move his left leg.   Objective: Vitals:   08/18/22 2357 08/19/22 0747 08/19/22 0853 08/19/22 1312  BP: (!) 153/91  (!) 146/87   Pulse: 75  75   Resp: 20  20   Temp: 98.6 F (37 C)  97.9 F (36.6 C)   TempSrc: Oral  Oral   SpO2: 91% 93% 93% 94%  Weight:      Height:        Intake/Output Summary (Last 24 hours) at 08/19/2022 1600 Last data filed at 08/19/2022 1304 Gross per 24 hour  Intake 960 ml  Output 1750 ml  Net -790 ml   Filed Weights   08/16/22 1130  Weight: 90.7 kg    Examination:   Constitutional: NAD, AAOx3 HEENT: conjunctivae and lids normal, EOMI CV: No cyanosis.   RESP: normal respiratory effort Neuro: II - XII grossly intact.     Data Reviewed: I have personally reviewed labs and imaging studies  Time spent: 35 minutes  Darlin Priestly, MD [facility] If 7PM-7AM, please contact night-coverage 08/19/2022, 4:00 PM

## 2022-08-20 DIAGNOSIS — J441 Chronic obstructive pulmonary disease with (acute) exacerbation: Secondary | ICD-10-CM | POA: Diagnosis not present

## 2022-08-20 MED ORDER — ACETYLCYSTEINE 20 % IN SOLN
4.0000 mL | Freq: Two times a day (BID) | RESPIRATORY_TRACT | Status: DC
  Administered 2022-08-20 – 2022-08-22 (×6): 4 mL via RESPIRATORY_TRACT
  Filled 2022-08-20 (×6): qty 4

## 2022-08-20 MED ORDER — IPRATROPIUM-ALBUTEROL 0.5-2.5 (3) MG/3ML IN SOLN
3.0000 mL | Freq: Three times a day (TID) | RESPIRATORY_TRACT | Status: DC
  Administered 2022-08-20 – 2022-08-26 (×18): 3 mL via RESPIRATORY_TRACT
  Filled 2022-08-20 (×18): qty 3

## 2022-08-20 NOTE — Progress Notes (Signed)
Mobility Specialist - Progress Note   08/20/22 1419  Mobility  Activity Transferred from bed to chair;Stood at bedside  Level of Assistance Maximum assist, patient does 25-49%  Assistive Device Front wheel walker  Distance Ambulated (ft) 1 ft  Activity Response Tolerated well  Mobility Referral Yes  $Mobility charge 1 Mobility   Pt sitting in recliner on 4L upon arrival. Pt STS twice (30 seconds and 55 seconds) Max + 2. Pt SPT from recliner to bed ModA +2. Pt has one LOB due to leg buckling corrected by Thereasa Parkin. Pt able to lift legs into bed and reposition indep. Pt left in bed with needs in reach and bed alarm set.   Terrilyn Saver  Mobility Specialist  08/20/22 2:22 PM

## 2022-08-20 NOTE — Plan of Care (Signed)

## 2022-08-20 NOTE — Progress Notes (Deleted)
Neurosurgery visit note Patient seen and examined.  Patient denies anything in the low back area no drainage and otherwise has been doing well in antibiotics and mobilizing.  Back incision and examined.  The dressing was removed and can remain open to air.  His staples are in place there is no redness tenderness swelling in the area and look like it is well-healing staples can remain in place until removed in the clinic  AP: Overall the patient is doing well from neurosurgery standpoint.  We will arrange follow-up for his staple removal as needed.  Will continue to follow  Christopher Mason. Monetta Lick MD Neurosurgery

## 2022-08-20 NOTE — Progress Notes (Addendum)
  PROGRESS NOTE    Christopher Mason  WUJ:[id number] DOB: [birth year] DOA: 08/16/2022 PCP: Sallyanne Kuster, NP  148A/[id number]-AA  LOS: 3 days   Brief hospital course:   Assessment & Plan: Christopher Mason is a 62 y.o. male with medical history significant of COPD, schizophrenia, previous spinal surgery, hypothyroidism, hypothyroidism, bipolar disorder, tardive dyskinesia, who presents to the ED with complaints of a fall.   He notes that his left leg weakness and numbness started as left hip pain a few days ago. He has had multiple falls due to the weakness. He denies any other focal weakness. He endorses increased cough over the last few days in addition to increased shortness of breath.    * COPD exacerbation (HCC) Patient presenting with increased shortness of breath and cough over the last few days.  --90% on room air  Plan: --cont prednisone 40 mg daily --DuoNeb scheduled --add mucomyst neb  Possible CAP --no fever, leukocytosis, procal neg.  CXR read as "patchy airspace disease at the left base suggesting pneumonia."  Personal review did not see obvious consolidation. --completed 2 days of ceftriaxone and azithromycin and 3 days of Levaquin for a 5-day course.  Left leg weakness 2/2 left-sided lumbar radiculopathy due to disc herniation at L3/4 with possible involvement at L2-3 as well S/p epidural steroid injection by IR on 4/26 --finding per MRI --neurosurgery consulted, currently not a surgical candidate due to COPD exacerbation. --PT and rehab  AKI (acute kidney injury) (HCC) --Cr 1.79 on presentation.  Baseline around 1.  Cr back to baseline the next day after IVF.  Schizophrenia (HCC) - Continue home amitriptyline, and venlafaxine --cont home monthly Invega, last given 4/19   HTN (hypertension) Patient has a history of hypertension currently on amlodipine and lisinopril.  Soft blood pressure noted by EMS with improvement with IV fluids. --cont home propranolol  and amlodipine   DVT prophylaxis: Lovenox SQ Code Status: Full code  Family Communication:  Level of care: Med-Surg Dispo:   The patient is from: home Anticipated d/c is to: SNF rehab Anticipated d/c date is: whenever bed available   Subjective and Interval History:  Pt reported buckling at left hip.  Had cough in the morning.  No BM yet.   Objective: Vitals:   08/20/22 0135 08/20/22 0749 08/20/22 0808 08/20/22 1242  BP: (!) 154/95 (!) 146/96    Pulse: 79 76    Resp: 20 18    Temp: 98.1 F (36.7 C) 97.7 F (36.5 C)    TempSrc:      SpO2: 91% 97% 95% 95%  Weight:      Height:        Intake/Output Summary (Last 24 hours) at 08/20/2022 1459 Last data filed at 08/20/2022 1437 Gross per 24 hour  Intake 1440 ml  Output 2100 ml  Net -660 ml   Filed Weights   08/16/22 1130  Weight: 90.7 kg    Examination:   Constitutional: NAD, AAOx3 HEENT: conjunctivae and lids normal, EOMI CV: No cyanosis.   RESP: normal respiratory effort Extremities: No effusions, edema in BLE SKIN: warm, dry Neuro: II - XII grossly intact.   Psych: Normal mood and affect.  Appropriate judgement and reason   Data Reviewed: I have personally reviewed labs and imaging studies  Time spent: 35 minutes  Darlin Priestly, MD [facility] If 7PM-7AM, please contact night-coverage 08/20/2022, 2:59 PM

## 2022-08-21 DIAGNOSIS — J441 Chronic obstructive pulmonary disease with (acute) exacerbation: Secondary | ICD-10-CM | POA: Diagnosis not present

## 2022-08-21 LAB — CBC
HCT: 35.6 % — ABNORMAL LOW (ref 39.0–52.0)
Hemoglobin: 11.3 g/dL — ABNORMAL LOW (ref 13.0–17.0)
MCH: 29 pg (ref 26.0–34.0)
MCHC: 31.7 g/dL (ref 30.0–36.0)
MCV: 91.3 fL (ref 80.0–100.0)
Platelets: 236 10*3/uL (ref 150–400)
RBC: 3.9 MIL/uL — ABNORMAL LOW (ref 4.22–5.81)
RDW: 14.5 % (ref 11.5–15.5)
WBC: 9.9 10*3/uL (ref 4.0–10.5)
nRBC: 0.3 % — ABNORMAL HIGH (ref 0.0–0.2)

## 2022-08-21 LAB — BASIC METABOLIC PANEL
Anion gap: 9 (ref 5–15)
BUN: 26 mg/dL — ABNORMAL HIGH (ref 8–23)
CO2: 27 mmol/L (ref 22–32)
Calcium: 9.4 mg/dL (ref 8.9–10.3)
Chloride: 98 mmol/L (ref 98–111)
Creatinine, Ser: 0.92 mg/dL (ref 0.61–1.24)
GFR, Estimated: >60 mL/min (ref >60)
Glucose, Bld: 123 mg/dL — ABNORMAL HIGH (ref 70–99)
Potassium: 4.5 mmol/L (ref 3.5–5.1)
Sodium: 134 mmol/L — ABNORMAL LOW (ref 135–145)

## 2022-08-21 LAB — MAGNESIUM: Magnesium: 2.4 mg/dL (ref 1.7–2.4)

## 2022-08-21 NOTE — Plan of Care (Signed)

## 2022-08-21 NOTE — Progress Notes (Signed)
  PROGRESS NOTE    Christopher Mason  ZOX:[id number] DOB: 1960/05/16 DOA: 08/16/2022 PCP: Sallyanne Kuster, NP  148A/[id number]-AA  LOS: 4 days   Brief hospital course:   Assessment & Plan: Christopher Mason is a 62 y.o. male with medical history significant of COPD, schizophrenia, previous spinal surgery, hypothyroidism, hypothyroidism, bipolar disorder, tardive dyskinesia, who presents to the ED with complaints of a fall.   He notes that his left leg weakness and numbness started as left hip pain a few days ago. He has had multiple falls due to the weakness. He denies any other focal weakness. He endorses increased cough over the last few days in addition to increased shortness of breath.    * COPD exacerbation (HCC) Patient presenting with increased shortness of breath and cough over the last few days.   --90% on room air  --completed 5 days of prednisone 40 mg daily Plan: --DuoNeb scheduled --cont mucomyst neb  Smoker --nicotine patch  Possible CAP --no fever, leukocytosis, procal neg.  CXR read as "patchy airspace disease at the left base suggesting pneumonia."  Personal review did not see obvious consolidation. --completed 2 days of ceftriaxone and azithromycin and 3 days of Levaquin for a 5-day course.  Left leg weakness 2/2 left-sided lumbar radiculopathy due to disc herniation at L3/4 with possible involvement at L2-3 as well S/p epidural steroid injection by IR on 4/26 --finding per MRI --neurosurgery consulted, currently not a surgical candidate due to COPD exacerbation. --PT and rehab  AKI (acute kidney injury) (HCC) --Cr 1.79 on presentation.  Baseline around 1.  Cr back to baseline the next day after IVF.  Schizophrenia (HCC) - Continue home amitriptyline, and venlafaxine --cont home monthly Invega, last given 4/19   HTN (hypertension) Patient has a history of hypertension currently on amlodipine and lisinopril.  Soft blood pressure noted by EMS with  improvement with IV fluids. --cont home propranolol and amlodipine   DVT prophylaxis: Lovenox SQ Code Status: Full code  Family Communication:  Level of care: Med-Surg Dispo:   The patient is from: home Anticipated d/c is to: difficult disposition Anticipated d/c date is: whenever safe disposition is obtained   Subjective and Interval History:  Pt reported numbness and weakness in his left leg.  PT found pt's left knee to buckle, so rec knee immobilizer.     Objective: Vitals:   08/21/22 0928 08/21/22 1005 08/21/22 1329 08/21/22 1706  BP: (!) 157/103 (!) 146/99  (!) 161/121  Pulse: 96   87  Resp: 20   18  Temp: 98.4 F (36.9 C)   97.6 F (36.4 C)  TempSrc: Oral     SpO2: 93%  93% (!) 88%  Weight:      Height:        Intake/Output Summary (Last 24 hours) at 08/21/2022 1911 Last data filed at 08/21/2022 1741 Gross per 24 hour  Intake 480 ml  Output 1075 ml  Net -595 ml   Filed Weights   08/16/22 1130  Weight: 90.7 kg    Examination:   Constitutional: NAD, AAOx3 HEENT: conjunctivae and lids normal, EOMI CV: No cyanosis.   RESP: normal respiratory effort Neuro: II - XII grossly intact.   Psych: Normal mood and affect.  Appropriate judgement and reason   Data Reviewed: I have personally reviewed labs and imaging studies  Time spent: 35 minutes  Darlin Priestly, MD [facility] If 7PM-7AM, please contact night-coverage 08/21/2022, 7:11 PM

## 2022-08-21 NOTE — TOC Progression Note (Addendum)
Transition of Care [facility]) - Progression Note    Patient Details  Name: Christopher Mason MRN: [id number] Date of Birth: 1961-01-29  Transition of Care [facility]) CM/SW Contact  Marlowe Sax, RN Phone Number: 08/21/2022, 10:37 AM  Clinical Narrative:    TOC continues to follow the patient to assist with DC planning He lives alone at home He is not able to go to a STR due to having to sign over his check that he needs to pay his rent and bills He is a MAX + 2 to get up and is on 4 liters of Oxygen Home health services is not easy to find due to Medicaid and even with [facility] he will not have daily help at home [place] with [facility] reported that they could go by daily but not stay with him for any amount of time  Reaching out to all of the [facility] agencies to see if can find [facility] for PT and OT [facility] is not able to accept the patient I reached out to [facility], [facility], [facility], [facility], [facility], [facility] and [facility] awaiting response  [facility] is not able to accept [facility] is not able to accept [facility] is [facility] and unable to accept [facility] unable to accept   Barriers to Discharge: Continued Medical Work up  Expected Discharge Plan and Services   Discharge Planning Services: CM Consult                     DME Arranged: N/A DME Agency: NA                   Social Determinants of Health (SDOH) Interventions SDOH Screenings   Food Insecurity: No Food Insecurity (08/16/2022)  Housing: Low Risk  (08/16/2022)  Transportation Needs: No Transportation Needs (08/16/2022)  Utilities: Not At Risk (08/16/2022)  Alcohol Screen: Low Risk  (09/06/2021)  Depression (PHQ2-9): Low Risk  (09/06/2021)  Tobacco Use: High Risk (08/18/2022)    Readmission Risk Interventions     No data to display

## 2022-08-21 NOTE — Progress Notes (Signed)
Physical Therapy Treatment Patient Details Name: Christopher Mason MRN: [id number] DOB: 1961/01/10 Today's Date: 08/21/2022   History of Present Illness Pt is a 62 y.o. male with medical history significant of COPD, schizophrenia, previous spinal surgery, hypothyroidism, hypothyroidism, bipolar disorder, tardive dyskinesia, who presents to the ED with complaints of falls.  MD assessment includes: COPD exacerbation, CAP, AKI, and LLE weakness. MRI impression includes: multilevel lumbar spondylosis, worst at L3-4, where a left subarticular disc extrusion results in compression of the traversing left L4 nerve root in the left lateral recess.  Moderate left neural foraminal narrowing at this level, displacement of the traversing left L3 nerve root in the left lateral recess at L2-3, and severe bilateral neural foraminal narrowing at L5-S1.    PT Comments    Pt was pleasant and motivated to participate during the session and put forth good effort throughout. Pt required physical assistance for LLE and trunk management with cues for use of bed rail.  Pt required no physical assist to stand but required cuing for proper sequencing with KI donned.  Gait training provided with KI to LLE this session with KI assisting to prevent L knee buckling on two occasions.  Pt able to amb 6 feet max before needing seated rest breaks with SpO2 and HR WNL on 3LO2/min. Of note very little LLE quad activation noted this session during therex.  Pt will benefit from continued PT services upon discharge to safely address deficits listed in patient problem list for decreased caregiver assistance and eventual return to PLOF.      Recommendations for follow up therapy are one component of a multi-disciplinary discharge planning process, led by the attending physician.  Recommendations may be updated based on patient status, additional functional criteria and insurance authorization.  Follow Up Recommendations  Can patient  physically be transported by private vehicle: No    Assistance Recommended at Discharge Frequent or constant Supervision/Assistance  Patient can return home with the following A lot of help with walking and/or transfers;A lot of help with bathing/dressing/bathroom;Assistance with cooking/housework;Direct supervision/assist for medications management;Assist for transportation;Help with stairs or ramp for entrance   Equipment Recommendations  Rolling walker (2 wheels);BSC/3in1    Recommendations for Other Services       Precautions / Restrictions Precautions Precautions: Fall Restrictions Weight Bearing Restrictions: No Other Position/Activity Restrictions: OK to use LLE KI as needed     Mobility  Bed Mobility Overal bed mobility: Needs Assistance Bed Mobility: Supine to Sit, Sit to Supine     Supine to sit: Mod assist Sit to supine: Mod assist   General bed mobility comments: Mod A for LLE and trunk control with cues given for use of bed rail    Transfers Overall transfer level: Needs assistance Equipment used: Rolling walker (2 wheels) Transfers: Sit to/from Stand Sit to Stand: Min guard, +2 safety/equipment           General transfer comment: Mod verbal and visual cues for sequencing with KI donned to LLE    Ambulation/Gait Ambulation/Gait assistance: Min guard, +2 safety/equipment Gait Distance (Feet): 6 Feet x 3 Assistive device: Rolling walker (2 wheels) Gait Pattern/deviations: Decreased stance time - left, Step-to pattern, Decreased step length - right Gait velocity: decreased     General Gait Details: Mod verbal and visual cues for PWB sequencing to address LLE weakness and tendency to buckle; pt presented with 2 instances of minor L knee buckling but KI enabled pt to self-correct without physical assistance   Stairs  Wheelchair Mobility    Modified Rankin (Stroke Patients Only)       Balance Overall balance assessment: Needs  assistance, History of Falls Sitting-balance support: Feet supported Sitting balance-Leahy Scale: Good     Standing balance support: Bilateral upper extremity supported, During functional activity, Reliant on assistive device for balance Standing balance-Leahy Scale: Fair                              Cognition Arousal/Alertness: Awake/alert Behavior During Therapy: WFL for tasks assessed/performed Overall Cognitive Status: Within Functional Limits for tasks assessed                                          Exercises Total Joint Exercises Ankle Circles/Pumps: AROM, Strengthening, Both, 5 reps, 10 reps Quad Sets: Strengthening, Both, 10 reps (minimal activation on the LLE) Gluteal Sets: Strengthening, Both, 10 reps Hip ABduction/ADduction: AAROM, Strengthening, Left, 10 reps Straight Leg Raises: AAROM, Strengthening, Left, 10 reps Other Exercises Other Exercises: HEP education for BLE APs, QS, and GS x 10 each very 1-2 hours daily    General Comments        Pertinent Vitals/Pain Pain Assessment Pain Assessment: 0-10 Pain Score: 8  Pain Location: RLE Pain Descriptors / Indicators: Sore Pain Intervention(s): Repositioned, Premedicated before session, Monitored during session    Home Living                          Prior Function            PT Goals (current goals can now be found in the care plan section) Progress towards PT goals: Not progressing toward goals - comment (limited by LLE weakness)    Frequency    Min 3X/week      PT Plan Current plan remains appropriate    Co-evaluation              AM-PAC PT "6 Clicks" Mobility   Outcome Measure  Help needed turning from your back to your side while in a flat bed without using bedrails?: A Little Help needed moving from lying on your back to sitting on the side of a flat bed without using bedrails?: A Little Help needed moving to and from a bed to a chair  (including a wheelchair)?: A Little Help needed standing up from a chair using your arms (e.g., wheelchair or bedside chair)?: A Little Help needed to walk in hospital room?: A Lot Help needed climbing 3-5 steps with a railing? : Total 6 Click Score: 15    End of Session Equipment Utilized During Treatment: Gait belt Activity Tolerance: Patient tolerated treatment well Patient left: in bed;with call bell/phone within reach;with bed alarm set;Other (comment) (Pt declined up in chair having recently returned from chair to bed prior to session) Nurse Communication: Mobility status PT Visit Diagnosis: History of falling (Z91.81);Unsteadiness on feet (R26.81);Muscle weakness (generalized) (M62.81);Difficulty in walking, not elsewhere classified (R26.2);Pain Pain - Right/Left: Right Pain - part of body: Leg     Time: 1051-1130 PT Time Calculation (min) (ACUTE ONLY): 39 min  Charges:  $Gait Training: 8-22 mins $Therapeutic Exercise: 8-22 mins $Therapeutic Activity: 8-22 mins                     D. Scott Zacharey Jensen PT, DPT 08/21/22, 12:00  PM

## 2022-08-22 ENCOUNTER — Ambulatory Visit: Payer: Medicaid Other | Admitting: Internal Medicine

## 2022-08-22 DIAGNOSIS — R29898 Other symptoms and signs involving the musculoskeletal system: Secondary | ICD-10-CM | POA: Diagnosis not present

## 2022-08-22 DIAGNOSIS — J441 Chronic obstructive pulmonary disease with (acute) exacerbation: Secondary | ICD-10-CM | POA: Diagnosis not present

## 2022-08-22 DIAGNOSIS — M5116 Intervertebral disc disorders with radiculopathy, lumbar region: Secondary | ICD-10-CM | POA: Diagnosis not present

## 2022-08-22 NOTE — Progress Notes (Signed)
Occupational Therapy Treatment Patient Details Name: Christopher Mason MRN: [id number] DOB: Aug 24, 1960 Today's Date: 08/22/2022   History of present illness Pt is a 62 y.o. male with medical history significant of COPD, schizophrenia, previous spinal surgery, hypothyroidism, hypothyroidism, bipolar disorder, tardive dyskinesia, who presents to the ED with complaints of falls.  MD assessment includes: COPD exacerbation, CAP, AKI, and LLE weakness. MRI impression includes: multilevel lumbar spondylosis, worst at L3-4, where a left subarticular disc extrusion results in compression of the traversing left L4 nerve root in the left lateral recess.  Moderate left neural foraminal narrowing at this level, displacement of the traversing left L3 nerve root in the left lateral recess at L2-3, and severe bilateral neural foraminal narrowing at L5-S1.   OT comments  Pt seen for OT tx. Pt endorsing feeling SOB and very fatigued at start of session. In bed, SpO2 was 87-88% on 2L. O2 increased to 3L with cues for pursed lip breathing and repositioning in bed for better posture, he was able to maintain 90-91% on 3L. Pt educated in PLB and how to utilize during ADL/mobility efforts to support participation and recovery while minimizing risk of panicking. Pt required VC during session to utilize. Educated in use of pulse oximeter to monitor his HR/SpO2. RN notified, left on 3L. Continue with POC.    Recommendations for follow up therapy are one component of a multi-disciplinary discharge planning process, led by the attending physician.  Recommendations may be updated based on patient status, additional functional criteria and insurance authorization.    Assistance Recommended at Discharge Frequent or constant Supervision/Assistance  Patient can return home with the following  A lot of help with bathing/dressing/bathroom;A lot of help with walking and/or transfers;Assistance with cooking/housework;Assist for  transportation;Help with stairs or ramp for entrance;Direct supervision/assist for medications management   Equipment Recommendations  BSC/3in1    Recommendations for Other Services      Precautions / Restrictions Precautions Precautions: Fall Restrictions Weight Bearing Restrictions: No Other Position/Activity Restrictions: OK to use LLE KI as needed       Mobility Bed Mobility               General bed mobility comments: With HOB flat, pt able to boost himself up using BUE and RLE; further mobility deferred 2/2 pt feeling SOB    Transfers                         Balance                                           ADL either performed or assessed with clinical judgement   ADL Overall ADL's : Needs assistance/impaired     Grooming: Set up;Wash/dry face;Wash/dry hands Grooming Details (indicate cue type and reason): long sitting in bed 2/2 feeling SOB and very fatigued, set up for grooming                                    Extremity/Trunk Assessment              Vision       Perception     Praxis      Cognition Arousal/Alertness: Awake/alert Behavior During Therapy: WFL for tasks assessed/performed Overall Cognitive Status: Within Functional Limits for tasks assessed  General Comments: pt endorses feeling very tired        Exercises Other Exercises Other Exercises: Pt educated in PLB and how to utilize during ADL/mobility efforts to support participation and recovery while minimizing risk of panicking. Pt required VC during session to utilize. Educated in use of pulse oximeter to monitor his HR/SpO2.    Shoulder Instructions       General Comments      Pertinent Vitals/ Pain       Pain Assessment Pain Assessment: No/denies pain  Home Living                                          Prior Functioning/Environment               Frequency  Min 1X/week        Progress Toward Goals  OT Goals(current goals can now be found in the care plan section)  Progress towards OT goals: OT to reassess next treatment  Acute Rehab OT Goals Patient Stated Goal: return home OT Goal Formulation: With patient Time For Goal Achievement: 08/30/22 Potential to Achieve Goals: Good  Plan Discharge plan remains appropriate;Frequency remains appropriate    Co-evaluation                 AM-PAC OT "6 Clicks" Daily Activity     Outcome Measure   Help from another person eating meals?: None Help from another person taking care of personal grooming?: A Little Help from another person toileting, which includes using toliet, bedpan, or urinal?: A Lot Help from another person bathing (including washing, rinsing, drying)?: A Lot Help from another person to put on and taking off regular upper body clothing?: A Little Help from another person to put on and taking off regular lower body clothing?: A Lot 6 Click Score: 16    End of Session Equipment Utilized During Treatment: Oxygen  OT Visit Diagnosis: Unsteadiness on feet (R26.81);Muscle weakness (generalized) (M62.81);History of falling (Z91.81)   Activity Tolerance Patient limited by fatigue   Patient Left in bed;with call bell/phone within reach;with bed alarm set   Nurse Communication Other (comment) (O2 sats)        Time: 1041-1100 OT Time Calculation (min): 19 min  Charges: OT General Charges $OT Visit: 1 Visit OT Treatments $Self Care/Home Management : 8-22 mins  Arman Filter., MPH, MS, OTR/L ascom [phone] 08/22/22, 11:57 AM

## 2022-08-22 NOTE — Progress Notes (Signed)
Physical Therapy Treatment Patient Details Name: Christopher Mason MRN: [id number] DOB: 04-Jun-1960 Today's Date: 08/22/2022   History of Present Illness Pt is a 62 y.o. male with medical history significant of COPD, schizophrenia, previous spinal surgery, hypothyroidism, hypothyroidism, bipolar disorder, tardive dyskinesia, who presents to the ED with complaints of falls.  MD assessment includes: COPD exacerbation, CAP, AKI, and LLE weakness. MRI impression includes: multilevel lumbar spondylosis, worst at L3-4, where a left subarticular disc extrusion results in compression of the traversing left L4 nerve root in the left lateral recess.  Moderate left neural foraminal narrowing at this level, displacement of the traversing left L3 nerve root in the left lateral recess at L2-3, and severe bilateral neural foraminal narrowing at L5-S1.    PT Comments    Pt seen for PT tx with pt agreeable, PT assists pt with donning L KI total assist. Pt is able to complete bed mobility with reliance on hospital bed features with supervision. Pt requires min assist for STS & stand pivot bed>recliner with RW. Pt notes 10/10 RPE after transfer to recliner & requires rest. Pt performs LLE strengthening exercises with cuing for technique & AAROM. Session focused on d/c planning with pt aware of need for ongoing PT services but does not want to give up check to go to rehab. Pt aware he should not go home alone as he is as a very high risk for falls & readmission. Pt unsure why he cannot stay in hospital for rehab despite PT attempting to educate him. Continue to recommend ongoing PT services to address strengthening, balance, & safety with mobility.    Recommendations for follow up therapy are one component of a multi-disciplinary discharge planning process, led by the attending physician.  Recommendations may be updated based on patient status, additional functional criteria and insurance authorization.  Follow Up  Recommendations  Can patient physically be transported by private vehicle: No    Assistance Recommended at Discharge Frequent or constant Supervision/Assistance  Patient can return home with the following A lot of help with walking and/or transfers;A lot of help with bathing/dressing/bathroom;Assistance with cooking/housework;Direct supervision/assist for medications management;Assist for transportation;Help with stairs or ramp for entrance   Equipment Recommendations  Rolling walker (2 wheels);BSC/3in1;Wheelchair (measurements PT);Wheelchair cushion (measurements PT);Hospital bed (non emergent EMS transport home)    Recommendations for Other Services       Precautions / Restrictions Precautions Precautions: Fall Restrictions Weight Bearing Restrictions: No Other Position/Activity Restrictions: OK to use LLE KI as needed     Mobility  Bed Mobility Overal bed mobility: Needs Assistance Bed Mobility: Supine to Sit     Supine to sit: Supervision, HOB elevated     General bed mobility comments: Extra time & effort with [facility] elevated & bed rails to complete supine>sit.    Transfers Overall transfer level: Needs assistance Equipment used: Rolling walker (2 wheels) Transfers: Sit to/from Stand, Bed to chair/wheelchair/BSC Sit to Stand: Min assist   Step pivot transfers: Min assist       General transfer comment: STS from EOB with cuing for hand placement, extra time to power up to standing.    Ambulation/Gait                   Stairs             Wheelchair Mobility    Modified Rankin (Stroke Patients Only)       Balance Overall balance assessment: Needs assistance, History of Falls Sitting-balance support: Feet supported Sitting  balance-Leahy Scale: Good     Standing balance support: Bilateral upper extremity supported, During functional activity, Reliant on assistive device for balance Standing balance-Leahy Scale: Fair                               Cognition Arousal/Alertness: Awake/alert Behavior During Therapy: WFL for tasks assessed/performed Overall Cognitive Status: Within Functional Limits for tasks assessed                                 General Comments: Agreeable to participate        Exercises General Exercises - Lower Extremity Hip ABduction/ADduction: AROM, Strengthening, Left, 10 reps (hip abduction slides) Straight Leg Raises: AROM, Strengthening, Left, 10 reps    General Comments General comments (skin integrity, edema, etc.): Pt on 2L/min via nasal cannula throughout session.      Pertinent Vitals/Pain Pain Assessment Pain Assessment: No/denies pain    Home Living                          Prior Function            PT Goals (current goals can now be found in the care plan section) Acute Rehab PT Goals Patient Stated Goal: To walk better PT Goal Formulation: With patient Time For Goal Achievement: 08/29/22 Potential to Achieve Goals: Good Progress towards PT goals: Progressing toward goals    Frequency    Min 3X/week      PT Plan Current plan remains appropriate;Equipment recommendations need to be updated    Co-evaluation              AM-PAC PT "6 Clicks" Mobility   Outcome Measure  Help needed turning from your back to your side while in a flat bed without using bedrails?: A Little Help needed moving from lying on your back to sitting on the side of a flat bed without using bedrails?: A Little Help needed moving to and from a bed to a chair (including a wheelchair)?: A Little Help needed standing up from a chair using your arms (e.g., wheelchair or bedside chair)?: A Little Help needed to walk in hospital room?: A Lot Help needed climbing 3-5 steps with a railing? : Total 6 Click Score: 15    End of Session Equipment Utilized During Treatment: Left knee immobilizer Activity Tolerance: Patient limited by fatigue Patient left: in  chair;with call bell/phone within reach;with chair alarm set Nurse Communication: Mobility status PT Visit Diagnosis: History of falling (Z91.81);Unsteadiness on feet (R26.81);Muscle weakness (generalized) (M62.81);Difficulty in walking, not elsewhere classified (R26.2);Pain;Other abnormalities of gait and mobility (R26.89)     Time: 4098-1191 PT Time Calculation (min) (ACUTE ONLY): 24 min  Charges:  $Therapeutic Activity: 23-37 mins                     Aleda Grana, PT, DPT 08/22/22, 4:56 PM   Sandi Mariscal 08/22/2022, 4:56 PM

## 2022-08-22 NOTE — Plan of Care (Signed)
  Problem: Education: Goal: Knowledge of General Education information will improve Description: Including pain rating scale, medication(s)/side effects and non-pharmacologic comfort measures Outcome: Progressing   Problem: Health Behavior/Discharge Planning: Goal: Ability to manage health-related needs will improve Outcome: Progressing   Problem: Clinical Measurements: Goal: Ability to maintain clinical measurements within normal limits will improve Outcome: Progressing Goal: Diagnostic test results will improve Outcome: Progressing Goal: Respiratory complications will improve Outcome: Progressing Goal: Cardiovascular complication will be avoided Outcome: Progressing   Problem: Nutrition: Goal: Adequate nutrition will be maintained Outcome: Progressing   Problem: Elimination: Goal: Will not experience complications related to bowel motility Outcome: Progressing Goal: Will not experience complications related to urinary retention Outcome: Progressing   Problem: Pain Managment: Goal: General experience of comfort will improve Outcome: Progressing   Problem: Safety: Goal: Ability to remain free from injury will improve Outcome: Progressing   Problem: Skin Integrity: Goal: Risk for impaired skin integrity will decrease Outcome: Progressing

## 2022-08-22 NOTE — Progress Notes (Addendum)
    Attending Progress Note  History: Christopher Mason is here for COPD exacerbation and LLE weakness.   08/22/2022: His strength is relatively stable.  He feels that his breathing is improved.  He had an epidural steroid injection on the 26th.  08/17/2022 Christopher Mason is in the hospital with a chief complaint of left leg weakness and difficulty with breathing.  He has previously had multiple spinal surgeries.  He has an extensive past medical history.  He has had multiple falls recently due to his left leg giving out.  He also describes pain down his left leg.  The pain is difficult to describe but appears to go into his left thigh.  He does not well describe pain below his left knee, though he does have some numbness around his ankle.   Over the past few days, he has had a cough as well as increased shortness of breath.  He recently quit smoking in the past week. Physical Exam: Vitals:   08/21/22 2352 08/22/22 0809  BP: (!) 141/85 (!) 122/90  Pulse: 96 85  Resp: 20 18  Temp: 98.2 F (36.8 C) 98.2 F (36.8 C)  SpO2: 91% 91%    AA Ox3 CNI Strength: Side Biceps Triceps Deltoid Interossei Grip Wrist Ext. Wrist Flex.  R 5 5 5 5 5 5 5   L 5 5 5 5 5 5 5     Side Iliopsoas Quads Hamstring PF DF EHL  R 5 5 5 5 5 5   L 4 3 5 5 5 5      Data:  Other tests/results: n/a  Assessment/Plan:  Christopher Mason is relatively stable with left leg weakness that causes his left quadriceps to buckle when he walks.  He had an epidural steroid injection on Friday.  This has helped a small amount with his pain.  I recommend that he continue with physical therapy and medical pain control.  After an epidural steroid injection, there is some period of time where he is at increased risk of infection.  This is between 6 and 12 weeks after an injection.  If he continues to have substantial weakness that precludes walking, I would consider surgical intervention as soon as 2 weeks after his injection  which would be the end of next week.  If he is dischargeable prior to that, we will follow him up in clinic to reassess.   Venetia Night MD, [facility] Department of Neurosurgery

## 2022-08-22 NOTE — Progress Notes (Signed)
PROGRESS NOTE    Christopher Mason  ZOX:[id number] DOB: 1960/11/26 DOA: 08/16/2022 PCP: Sallyanne Kuster, NP  [id number]  LOS: 5 days   Brief hospital course:   Assessment & Plan: Christopher Mason is a 62 y.o. male with medical history significant of COPD, schizophrenia, multiple previous spinal surgery, hypothyroidism, bipolar disorder, tardive dyskinesia, who presented to the ED with complaints of a fall.   He notes that his left leg weakness and numbness started as left hip pain a few days ago. He has had multiple falls due to the weakness. He denies any other focal weakness. He endorses increased cough over the last few days in addition to increased shortness of breath.    Left leg weakness 2/2 left-sided lumbar radiculopathy due to disc herniation at L3/4 with possible involvement at L2-3 as well S/p epidural steroid injection by IR on 4/26 --finding per MRI --neurosurgery consulted, initially deemed pt not a surgical candidate due to COPD exacerbation. --neurosurgery saw pt today, and If pt continues to have substantial weakness that precludes walking, would consider surgical intervention as soon as 2 weeks after his epidural injection  --PT   * COPD exacerbation (HCC) Patient presenting with increased shortness of breath and cough over the last few days.   --90% on room air  --completed 5 days of prednisone 40 mg daily, and 3 days of Mucomyst neb BID. Plan: --DuoNeb scheduled  Smoker --nicotine patch  Possible CAP --no fever, leukocytosis, procal neg.  CXR read as "patchy airspace disease at the left base suggesting pneumonia."  Personal review did not see obvious consolidation. --completed 2 days of ceftriaxone and azithromycin and 3 days of Levaquin for a 5-day course.  AKI (acute kidney injury) (HCC) --Cr 1.79 on presentation.  Baseline around 1.  Cr back to baseline the next day after IVF.  Schizophrenia (HCC) - Continue home amitriptyline, and  venlafaxine --cont home monthly Invega, last given 4/19   HTN (hypertension) Patient has a history of hypertension currently on amlodipine and lisinopril.  Soft blood pressure noted by EMS with improvement with IV fluids. --cont home propranolol and amlodipine   DVT prophylaxis: Lovenox SQ Code Status: Full code  Family Communication:  Level of care: Med-Surg Dispo:   The patient is from: home Anticipated d/c is to: difficult disposition.  Does not have Medicare and unwilling to hand over checks, so SNF is not an option.  Currently not safe to go home. Hoping for surgical fix and inpatient rehab to get pt strong enough to go home.    Subjective and Interval History:  Pt reported breathing ok and eating ok.  Due to continued numbness and weakness of pt's left leg, neurosurgery saw pt today to re-consideration of surgery.   Objective: Vitals:   08/22/22 0809 08/22/22 1141 08/22/22 1558 08/22/22 2012  BP: (!) 122/90  113/82   Pulse: 85  99   Resp: 18  18   Temp: 98.2 F (36.8 C)  98.1 F (36.7 C)   TempSrc:      SpO2: 91% 90% 91% 93%  Weight:      Height:        Intake/Output Summary (Last 24 hours) at 08/22/2022 2017 Last data filed at 08/22/2022 1925 Gross per 24 hour  Intake 480 ml  Output 750 ml  Net -270 ml   Filed Weights   08/16/22 1130  Weight: 90.7 kg    Examination:   Constitutional: NAD, AAOx3 HEENT: conjunctivae and lids normal, EOMI CV: No cyanosis.  RESP: normal respiratory effort Neuro: II - XII grossly intact.   Psych: Normal mood and affect.  Appropriate judgement and reason   Data Reviewed: I have personally reviewed labs and imaging studies  Time spent: 35 minutes  Darlin Priestly, MD [facility] If 7PM-7AM, please contact night-coverage 08/22/2022, 8:17 PM

## 2022-08-22 NOTE — TOC Progression Note (Signed)
Transition of Care [facility]) - Progression Note    Patient Details  Name: Christopher Mason MRN: [id number] Date of Birth: 06/25/1960  Transition of Care [facility]) CM/SW Contact  Marlowe Sax, RN Phone Number: 08/22/2022, 2:43 PM  Clinical Narrative:    Unable to find a [facility] agency to accept the patient.       Barriers to Discharge: Continued Medical Work up  Expected Discharge Plan and Services   Discharge Planning Services: CM Consult                     DME Arranged: N/A DME Agency: NA                   Social Determinants of Health (SDOH) Interventions SDOH Screenings   Food Insecurity: No Food Insecurity (08/16/2022)  Housing: Low Risk  (08/16/2022)  Transportation Needs: No Transportation Needs (08/16/2022)  Utilities: Not At Risk (08/16/2022)  Alcohol Screen: Low Risk  (09/06/2021)  Depression (PHQ2-9): Low Risk  (09/06/2021)  Tobacco Use: High Risk (08/18/2022)    Readmission Risk Interventions     No data to display

## 2022-08-23 DIAGNOSIS — J441 Chronic obstructive pulmonary disease with (acute) exacerbation: Secondary | ICD-10-CM | POA: Diagnosis not present

## 2022-08-23 LAB — CBC
HCT: 35.8 % — ABNORMAL LOW (ref 39.0–52.0)
Hemoglobin: 11.2 g/dL — ABNORMAL LOW (ref 13.0–17.0)
MCH: 28.6 pg (ref 26.0–34.0)
MCHC: 31.3 g/dL (ref 30.0–36.0)
MCV: 91.6 fL (ref 80.0–100.0)
Platelets: 237 10*3/uL (ref 150–400)
RBC: 3.91 MIL/uL — ABNORMAL LOW (ref 4.22–5.81)
RDW: 15.3 % (ref 11.5–15.5)
WBC: 10.3 10*3/uL (ref 4.0–10.5)
nRBC: 0 % (ref 0.0–0.2)

## 2022-08-23 LAB — BASIC METABOLIC PANEL
Anion gap: 5 (ref 5–15)
BUN: 32 mg/dL — ABNORMAL HIGH (ref 8–23)
CO2: 29 mmol/L (ref 22–32)
Calcium: 9 mg/dL (ref 8.9–10.3)
Chloride: 100 mmol/L (ref 98–111)
Creatinine, Ser: 0.96 mg/dL (ref 0.61–1.24)
GFR, Estimated: >60 mL/min (ref >60)
Glucose, Bld: 116 mg/dL — ABNORMAL HIGH (ref 70–99)
Potassium: 4.7 mmol/L (ref 3.5–5.1)
Sodium: 134 mmol/L — ABNORMAL LOW (ref 135–145)

## 2022-08-23 LAB — MAGNESIUM: Magnesium: 2.4 mg/dL (ref 1.7–2.4)

## 2022-08-23 NOTE — Progress Notes (Signed)
Physical Therapy Treatment Patient Details Name: Christopher Mason MRN: [id number] DOB: 1960/09/12 Today's Date: 08/23/2022   History of Present Illness Pt is a 62 y.o. male with medical history significant of COPD, schizophrenia, previous spinal surgery, hypothyroidism, hypothyroidism, bipolar disorder, tardive dyskinesia, who presents to the ED with complaints of falls.  MD assessment includes: COPD exacerbation, CAP, AKI, and LLE weakness. MRI impression includes: multilevel lumbar spondylosis, worst at L3-4, where a left subarticular disc extrusion results in compression of the traversing left L4 nerve root in the left lateral recess.  Moderate left neural foraminal narrowing at this level, displacement of the traversing left L3 nerve root in the left lateral recess at L2-3, and severe bilateral neural foraminal narrowing at L5-S1.    PT Comments    Pt was pleasant and motivated to participate during the session and put forth good effort throughout. Pt required multi-modal cues for sequencing during transfer training and for gait training.  Pt was able to ambulate around 8 feet with no LOB or buckling when sequencing gait correctly.  Without constant sequencing pt would quickly revert to incorrect sequencing and his L knee would buckle even with KI donned.  The KI kept the buckles to a minimum and allowed pt to recover without physical assistance. Pt remains at a very high risk for falls at this time.  Pt will benefit from continued PT services upon discharge to safely address deficits listed in patient problem list for decreased caregiver assistance and eventual return to PLOF.       Recommendations for follow up therapy are one component of a multi-disciplinary discharge planning process, led by the attending physician.  Recommendations may be updated based on patient status, additional functional criteria and insurance authorization.  Follow Up Recommendations  Can patient physically be  transported by private vehicle: No    Assistance Recommended at Discharge Frequent or constant Supervision/Assistance  Patient can return home with the following A lot of help with walking and/or transfers;A lot of help with bathing/dressing/bathroom;Assistance with cooking/housework;Direct supervision/assist for medications management;Assist for transportation;Help with stairs or ramp for entrance   Equipment Recommendations  Rolling walker (2 wheels);BSC/3in1;Wheelchair (measurements PT);Wheelchair cushion (measurements PT);Hospital bed    Recommendations for Other Services       Precautions / Restrictions Precautions Precautions: Fall Restrictions Weight Bearing Restrictions: No Other Position/Activity Restrictions: OK to use LLE KI as needed to the LLE     Mobility  Bed Mobility               General bed mobility comments: NT, pt in recliner    Transfers Overall transfer level: Needs assistance Equipment used: Rolling walker (2 wheels) Transfers: Sit to/from Stand Sit to Stand: Min assist           General transfer comment: Mod verbal cues for hand placement, LLE positioning with the KI donned, and increased trunk flexion    Ambulation/Gait Ambulation/Gait assistance: Min guard Gait Distance (Feet): 8 Feet x 2 Assistive device: Rolling walker (2 wheels) Gait Pattern/deviations: Decreased stance time - left, Step-to pattern, Decreased step length - right Gait velocity: decreased     General Gait Details: Mod to max multi-modal cues for sequencing for step-to sequencing to address LLE weakness   Stairs             Wheelchair Mobility    Modified Rankin (Stroke Patients Only)       Balance Overall balance assessment: Needs assistance, History of Falls Sitting-balance support: Feet supported Sitting balance-Leahy  Scale: Good     Standing balance support: Bilateral upper extremity supported, During functional activity, Reliant on assistive  device for balance Standing balance-Leahy Scale: Fair                              Cognition Arousal/Alertness: Awake/alert Behavior During Therapy: WFL for tasks assessed/performed Overall Cognitive Status: Within Functional Limits for tasks assessed                                          Exercises Total Joint Exercises Ankle Circles/Pumps: Strengthening, Both, 5 reps, 10 reps, AROM Quad Sets: Strengthening, Both, 10 reps Gluteal Sets: Strengthening, Both, 10 reps Hip ABduction/ADduction: Strengthening, Left, 10 reps Straight Leg Raises: Strengthening, Left, 10 reps Long Arc Quad: AROM, Strengthening, Both, 10 reps, 15 reps (small amplitude on the LLE) Knee Flexion: AROM, Strengthening, Both, 10 reps, 15 reps Other Exercises Other Exercises: HEP education/review for BLE APs, QS, LAQs, and GS x 10 each very 1-2 hours daily    General Comments        Pertinent Vitals/Pain Pain Assessment Pain Assessment: No/denies pain    Home Living                          Prior Function            PT Goals (current goals can now be found in the care plan section) Progress towards PT goals: Progressing toward goals    Frequency    Min 3X/week      PT Plan Current plan remains appropriate    Co-evaluation              AM-PAC PT "6 Clicks" Mobility   Outcome Measure  Help needed turning from your back to your side while in a flat bed without using bedrails?: A Little Help needed moving from lying on your back to sitting on the side of a flat bed without using bedrails?: A Little Help needed moving to and from a bed to a chair (including a wheelchair)?: A Little Help needed standing up from a chair using your arms (e.g., wheelchair or bedside chair)?: A Little Help needed to walk in hospital room?: A Lot Help needed climbing 3-5 steps with a railing? : Total 6 Click Score: 15    End of Session Equipment Utilized During  Treatment: Left knee immobilizer Activity Tolerance: Patient tolerated treatment well Patient left: in chair;with call bell/phone within reach;with chair alarm set Nurse Communication: Mobility status PT Visit Diagnosis: History of falling (Z91.81);Unsteadiness on feet (R26.81);Muscle weakness (generalized) (M62.81);Difficulty in walking, not elsewhere classified (R26.2);Pain;Other abnormalities of gait and mobility (R26.89) Pain - Right/Left: Right Pain - part of body: Leg     Time: 1610-9604 PT Time Calculation (min) (ACUTE ONLY): 24 min  Charges:  $Gait Training: 8-22 mins $Therapeutic Exercise: 8-22 mins                     D. Scott Najai Waszak PT, DPT 08/23/22, 12:06 PM

## 2022-08-23 NOTE — Progress Notes (Signed)
PROGRESS NOTE    Christopher Mason  RUE:[id number] DOB: May 23, 1960 DOA: 08/16/2022 PCP: Sallyanne Kuster, NP  [id number]  LOS: 6 days   Brief hospital course: "Christopher Mason is a 62 y.o. male with medical history significant of COPD, schizophrenia, multiple previous spinal surgery, hypothyroidism, bipolar disorder, tardive dyskinesia, who presented to the ED with complaints of a fall.   He notes that his left leg weakness and numbness started as left hip pain a few days ago. He has had multiple falls due to the weakness. He denies any other focal weakness. He endorses increased cough over the last few days in addition to increased shortness of breath. "  Further hospital course and management as outlined below.   Assessment & Plan:   Left leg weakness 2/2 left-sided lumbar radiculopathy due to disc herniation at L3/4 with possible involvement at L2-3  S/p epidural steroid injection by IR on 4/26 --finding per MRI --neurosurgery consulted, initially deemed pt not a surgical candidate due to COPD exacerbation. --neurosurgery saw pt 4/30 -- If pt continues to have substantial weakness that precludes walking, would consider surgical intervention as soon as 2 weeks after his epidural injection  --PT   * COPD exacerbation (HCC) Patient presenting with increased shortness of breath and cough over the last few days.   --90% on room air  --completed 5 days of prednisone 40 mg daily, and 3 days of Mucomyst neb BID. Plan: --DuoNeb scheduled  Smoker --nicotine patch  Possible CAP --no fever, leukocytosis, procal neg.  CXR read as "patchy airspace disease at the left base suggesting pneumonia."  Personal review did not see obvious consolidation. --completed 2 days of ceftriaxone and azithromycin and 3 days of Levaquin for a 5-day course.  AKI (acute kidney injury) (HCC) --Cr 1.79 on presentation.  Baseline around 1.  Cr back to baseline the next day after IVF.  Schizophrenia  (HCC) - stable - Continue home amitriptyline, and venlafaxine --cont home monthly Invega, last given 4/19   HTN (hypertension) - stable Patient has a history of hypertension currently on amlodipine and lisinopril.   Soft blood pressure noted by EMS with improvement with IV fluids. --cont home propranolol and amlodipine   DVT prophylaxis: Lovenox SQ Code Status: Full code  Family Communication:  Level of care: Med-Surg  Dispo:   The patient is from: home alone  Anticipated d/c is to: unknown / to be determined.   Unsafe discharge home, unable to ambulate or care for self, no caregiver support.  Does not have Medicare and unwilling to hand over checks, so SNF is not an option.  Remains admitted until able to safely d/c home and/or lumbar surgical intervention (cannot be done until at least two weeks after epidural steroid injection).   Subjective and Interval History:  Pt up in recliner when seen this AM.  He states no noticeable improvement since epidural injection yet.  States left leg buckles when he attempts any mobility.  Says he really misses his dog, but knows he could not manage at home in his current condition.  No other acute complaints.    Objective: Vitals:   08/23/22 0015 08/23/22 0733 08/23/22 0750 08/23/22 1342  BP: (!) 124/94  128/84   Pulse: 86  90   Resp: 16  16   Temp: 98 F (36.7 C)  98.2 F (36.8 C)   TempSrc:      SpO2: 91% 91% 91% 94%  Weight:      Height:  Intake/Output Summary (Last 24 hours) at 08/23/2022 1519 Last data filed at 08/23/2022 1420 Gross per 24 hour  Intake 960 ml  Output 425 ml  Net 535 ml   Filed Weights   08/16/22 1130  Weight: 90.7 kg    Examination:   General exam: awake, alert, no acute distress HEENT: moist mucus membranes, hearing grossly normal  Respiratory system: CTAB, no wheezes, rales or rhonchi, normal respiratory effort. Cardiovascular system: normal S1/S2, RRR, no pedal edema.   Gastrointestinal system:  soft, NT, ND, no HSM felt, +bowel sounds. Central nervous system: A&O x 3. no gross focal neurologic deficits, baseline dysarthric speech Extremities: LLE in brace, no edema, normal tone Skin: dry, intact, normal temperature Psychiatry: normal mood, congruent affect, judgement and insight appear normal    Data Reviewed: I have personally reviewed labs and imaging studies  Notable labs -- Na 134, glucose 116, BUN 32, Hbg stable 11.2    Time spent: 35 minutes  Pennie Banter, DO [facility] If 7PM-7AM, please contact night-coverage 08/23/2022, 3:19 PM

## 2022-08-24 DIAGNOSIS — J441 Chronic obstructive pulmonary disease with (acute) exacerbation: Secondary | ICD-10-CM | POA: Diagnosis not present

## 2022-08-24 NOTE — Plan of Care (Signed)

## 2022-08-24 NOTE — Progress Notes (Signed)
Mobility Specialist - Progress Note   08/24/22 1206  Mobility  Activity Ambulated with assistance in room;Transferred from bed to chair  Level of Assistance Standby assist, set-up cues, supervision of patient - no hands on  Assistive Device Front wheel walker  Distance Ambulated (ft) 4 ft  Activity Response Tolerated well  $Mobility charge 1 Mobility   Pt supine upon entry with KI donned, utilizing RA. Pt expresses feeling "sleepy today", however willing to participate. Pt agreeable to OOB amb, request to sit in recliner for lunch. Pt completed bed mob with supervision, opting to use bed rails for assistance without HOB being raised. Once seated EOB, Pt required MinA with scooting towards the edge of bed. Pt STS to RW MinA-SBA. Pt completed a ambulatory transfer to the recliner-- taking short steps and pivoting. Pt left seated in the recliner with alarm set and needs within reach. RN notified.  Zetta Bills Mobility Specialist 08/24/22 12:15 PM

## 2022-08-24 NOTE — Progress Notes (Signed)
PROGRESS NOTE    Christopher Mason  ZOX:[id number] DOB: 12-14-1960 DOA: 08/16/2022 PCP: Sallyanne Kuster, NP  [id number]  LOS: 7 days   Brief hospital course: "Christopher Mason is a 62 y.o. male with medical history significant of COPD, schizophrenia, multiple previous spinal surgery, hypothyroidism, bipolar disorder, tardive dyskinesia, who presented to the ED with complaints of a fall.   He notes that his left leg weakness and numbness started as left hip pain a few days ago. He has had multiple falls due to the weakness. He denies any other focal weakness. He endorses increased cough over the last few days in addition to increased shortness of breath. "  Further hospital course and management as outlined below.   Assessment & Plan:   Left leg weakness 2/2 left-sided lumbar radiculopathy due to disc herniation at L3/4 with possible involvement at L2-3  S/p epidural steroid injection by IR on 4/26 --finding per MRI --neurosurgery consulted, initially deemed pt not a surgical candidate due to COPD exacerbation. --neurosurgery saw pt 4/30 -- If pt continues to have substantial weakness that precludes walking, would consider surgical intervention as soon as 2 weeks after his epidural injection  --PT   * COPD exacerbation (HCC) Patient presenting with increased shortness of breath and cough over the last few days.   --90% on room air  --completed 5 days of prednisone 40 mg daily, and 3 days of Mucomyst neb BID. Plan: --DuoNeb scheduled  Smoker --nicotine patch  Possible CAP --no fever, leukocytosis, procal neg.  CXR read as "patchy airspace disease at the left base suggesting pneumonia."  Personal review did not see obvious consolidation. --completed 2 days of ceftriaxone and azithromycin and 3 days of Levaquin for a 5-day course.  AKI (acute kidney injury) (HCC) --Cr 1.79 on presentation.  Baseline around 1.  Cr back to baseline the next day after IVF.  Schizophrenia  (HCC) - stable - Continue home amitriptyline, and venlafaxine --cont home monthly Invega, last given 4/19   HTN (hypertension) - stable Patient has a history of hypertension currently on amlodipine and lisinopril.   Soft blood pressure noted by EMS with improvement with IV fluids. --cont home propranolol and amlodipine   DVT prophylaxis: Lovenox SQ Code Status: Full code  Family Communication:  Level of care: Med-Surg  Dispo:   The patient is from: home alone  Anticipated d/c is to: unknown / to be determined.   Unsafe discharge home, unable to ambulate or care for self, no caregiver support.  Does not have Medicare and unwilling to hand over checks, so SNF is not an option.  Remains admitted until able to safely d/c home and/or lumbar surgical intervention (cannot be done until at least two weeks after epidural steroid injection).   Subjective and Interval History:  Pt resting in bed when seen today.  He reports being extremely tired today, states people wake him up every hour all night long. Just wants to sleep.  No other acute complaints.    Objective: Vitals:   08/23/22 2006 08/23/22 2208 08/24/22 0722 08/24/22 0739  BP:  129/76  115/73  Pulse: 88 84  84  Resp: 18 20  18   Temp:  98 F (36.7 C)  98.1 F (36.7 C)  TempSrc:      SpO2: 92% 92% 95% 93%  Weight:      Height:        Intake/Output Summary (Last 24 hours) at 08/24/2022 1308 Last data filed at 08/24/2022 0900 Gross per 24  hour  Intake 1100 ml  Output 2500 ml  Net -1400 ml   Filed Weights   08/16/22 1130  Weight: 90.7 kg    Examination:   General exam: awake, appears fatigued, no acute distress HEENT: moist mucus membranes, hearing grossly normal  Respiratory system: on room air, normal respiratory effort. Cardiovascular system: RRR, no pedal edema.   Gastrointestinal system: soft, NT, ND Central nervous system: A&O x 3. no gross focal neurologic deficits, baseline dysarthric speech Extremities: LLE  in brace, no edema, normal tone Skin: dry, intact, normal temperature Psychiatry: normal mood, congruent affect, judgement and insight appear normal    Data Reviewed: I have personally reviewed labs and imaging studies  No new labs today    Time spent: 25 minutes  Pennie Banter, DO [facility] If 7PM-7AM, please contact night-coverage 08/24/2022, 1:08 PM

## 2022-08-24 NOTE — Progress Notes (Signed)
OT Cancellation Note  Patient Details Name: Christopher Mason MRN: [id number] DOB: 1961/03/31   Cancelled Treatment:    Reason Eval/Treat Not Completed: Fatigue/lethargy limiting ability to participate;Patient declined, no reason specified. Upon attempt, pt sleeping but wakes easily to OT's voice. Pt endorses feeling very sleepy, declines ADL/mobility with OT. SpO2 88% on 4L O2. RN notified.   Arman Filter., MPH, MS, OTR/L ascom [phone] 08/24/22, 3:48 PM

## 2022-08-25 DIAGNOSIS — J441 Chronic obstructive pulmonary disease with (acute) exacerbation: Secondary | ICD-10-CM | POA: Diagnosis not present

## 2022-08-25 LAB — BASIC METABOLIC PANEL
Anion gap: 9 (ref 5–15)
BUN: 31 mg/dL — ABNORMAL HIGH (ref 8–23)
CO2: 28 mmol/L (ref 22–32)
Calcium: 9.2 mg/dL (ref 8.9–10.3)
Chloride: 95 mmol/L — ABNORMAL LOW (ref 98–111)
Creatinine, Ser: 0.94 mg/dL (ref 0.61–1.24)
GFR, Estimated: >60 mL/min (ref >60)
Glucose, Bld: 118 mg/dL — ABNORMAL HIGH (ref 70–99)
Potassium: 4.8 mmol/L (ref 3.5–5.1)
Sodium: 132 mmol/L — ABNORMAL LOW (ref 135–145)

## 2022-08-25 LAB — CBC
HCT: 37 % — ABNORMAL LOW (ref 39.0–52.0)
Hemoglobin: 11.5 g/dL — ABNORMAL LOW (ref 13.0–17.0)
MCH: 28.9 pg (ref 26.0–34.0)
MCHC: 31.1 g/dL (ref 30.0–36.0)
MCV: 93 fL (ref 80.0–100.0)
Platelets: 237 10*3/uL (ref 150–400)
RBC: 3.98 MIL/uL — ABNORMAL LOW (ref 4.22–5.81)
RDW: 14.8 % (ref 11.5–15.5)
WBC: 10.3 10*3/uL (ref 4.0–10.5)
nRBC: 0 % (ref 0.0–0.2)

## 2022-08-25 LAB — MAGNESIUM: Magnesium: 2.2 mg/dL (ref 1.7–2.4)

## 2022-08-25 NOTE — Progress Notes (Signed)
Physical Therapy Treatment Patient Details Name: Christopher Mason MRN: [id number] DOB: 08-30-1960 Today's Date: 08/25/2022   History of Present Illness Pt is a 62 y.o. male with medical history significant of COPD, schizophrenia, previous spinal surgery, hypothyroidism, hypothyroidism, bipolar disorder, tardive dyskinesia, who presents to the ED with complaints of falls.  MD assessment includes: COPD exacerbation, CAP, AKI, and LLE weakness. MRI impression includes: multilevel lumbar spondylosis, worst at L3-4, where a left subarticular disc extrusion results in compression of the traversing left L4 nerve root in the left lateral recess.  Moderate left neural foraminal narrowing at this level, displacement of the traversing left L3 nerve root in the left lateral recess at L2-3, and severe bilateral neural foraminal narrowing at L5-S1.    PT Comments    Pt was pleasant and motivated to participate during the session and put forth good effort throughout. Pt required no physical assistance with bed mobility tasks this session but continued to need heavy cuing and assist to come to standing.  Pt was able to amb 2 x 4 feet this session but required seated therapeutic rest break between walks and presented with significant SOB after second walk with SpO2 in the mid 80s that increased quickly back to 89-91% upon returning to sitting on 3LO2/min.  Respiratory therapist in room at end of session and notified with plan to give pt a breathing treatment.  Pt will benefit from continued PT services upon discharge to safely address deficits listed in patient problem list for decreased caregiver assistance and eventual return to PLOF.     Recommendations for follow up therapy are one component of a multi-disciplinary discharge planning process, led by the attending physician.  Recommendations may be updated based on patient status, additional functional criteria and insurance authorization.  Follow Up  Recommendations  Can patient physically be transported by private vehicle: No    Assistance Recommended at Discharge Frequent or constant Supervision/Assistance  Patient can return home with the following A lot of help with walking and/or transfers;A lot of help with bathing/dressing/bathroom;Assistance with cooking/housework;Direct supervision/assist for medications management;Assist for transportation;Help with stairs or ramp for entrance   Equipment Recommendations  Rolling walker (2 wheels);BSC/3in1;Wheelchair (measurements PT);Wheelchair cushion (measurements PT);Hospital bed    Recommendations for Other Services       Precautions / Restrictions Precautions Precautions: Fall Restrictions Weight Bearing Restrictions: No Other Position/Activity Restrictions: OK to use LLE KI as needed to the LLE     Mobility  Bed Mobility Overal bed mobility: Needs Assistance Bed Mobility: Supine to Sit, Sit to Supine     Supine to sit: Supervision Sit to supine: Supervision   General bed mobility comments: Extra time and effort along with use of bed rail required but no physical assist needed    Transfers Overall transfer level: Needs assistance Equipment used: Rolling walker (2 wheels) Transfers: Sit to/from Stand Sit to Stand: Min assist, From elevated surface           General transfer comment: Mod verbal cues for hand placement, LLE positioning with the KI donned, and increased trunk flexion    Ambulation/Gait Ambulation/Gait assistance: Min guard Gait Distance (Feet): 4 Feet x 2 Assistive device: Rolling walker (2 wheels) Gait Pattern/deviations: Decreased stance time - left, Step-to pattern, Decreased step length - right Gait velocity: decreased     General Gait Details: Mod to max multi-modal cues for sequencing for step-to sequencing to address LLE weakness   Stairs  Wheelchair Mobility    Modified Rankin (Stroke Patients Only)        Balance Overall balance assessment: Needs assistance, History of Falls Sitting-balance support: Feet supported Sitting balance-Leahy Scale: Good     Standing balance support: Bilateral upper extremity supported, During functional activity, Reliant on assistive device for balance Standing balance-Leahy Scale: Fair                              Cognition Arousal/Alertness: Awake/alert Behavior During Therapy: WFL for tasks assessed/performed Overall Cognitive Status: Within Functional Limits for tasks assessed                                          Exercises Total Joint Exercises Ankle Circles/Pumps: Strengthening, Both, 5 reps, 10 reps, AROM (with manual resistance) Quad Sets: Strengthening, Both, 10 reps Gluteal Sets: Strengthening, Both, 10 reps Hip ABduction/ADduction: Strengthening, 10 reps, Both Straight Leg Raises: Strengthening, 10 reps, Both Other Exercises Other Exercises: HEP education/review for BLE APs, QS, LAQs, and GS x 10 each very 1-2 hours daily    General Comments        Pertinent Vitals/Pain Pain Assessment Pain Assessment: No/denies pain    Home Living                          Prior Function            PT Goals (current goals can now be found in the care plan section) Progress towards PT goals: Progressing toward goals    Frequency    Min 3X/week      PT Plan Current plan remains appropriate    Co-evaluation              AM-PAC PT "6 Clicks" Mobility   Outcome Measure  Help needed turning from your back to your side while in a flat bed without using bedrails?: A Little Help needed moving from lying on your back to sitting on the side of a flat bed without using bedrails?: A Little Help needed moving to and from a bed to a chair (including a wheelchair)?: A Little Help needed standing up from a chair using your arms (e.g., wheelchair or bedside chair)?: A Little Help needed to walk in  hospital room?: A Lot Help needed climbing 3-5 steps with a railing? : Total 6 Click Score: 15    End of Session Equipment Utilized During Treatment: Left knee immobilizer Activity Tolerance: Patient tolerated treatment well Patient left: in bed;with call bell/phone within reach;with bed alarm set;Other (comment) (respiratory therapist in room at end of session) Nurse Communication: Mobility status PT Visit Diagnosis: History of falling (Z91.81);Unsteadiness on feet (R26.81);Muscle weakness (generalized) (M62.81);Difficulty in walking, not elsewhere classified (R26.2);Pain;Other abnormalities of gait and mobility (R26.89) Pain - Right/Left: Right Pain - part of body: Leg     Time: 4540-9811 PT Time Calculation (min) (ACUTE ONLY): 24 min  Charges:  $Gait Training: 8-22 mins $Therapeutic Exercise: 8-22 mins                      D. Scott Chanay Nugent PT, DPT 08/25/22, 3:33 PM

## 2022-08-25 NOTE — Progress Notes (Signed)
PROGRESS NOTE    Christopher Mason  ZOX:[id number] DOB: [birth year] DOA: 08/16/2022 PCP: Sallyanne Kuster, NP  [id number]/[id number]-AA  LOS: 8 days   Brief hospital course: "Nainoa Stolarz is a 62 y.o. male with medical history significant of COPD, schizophrenia, multiple previous spinal surgery, hypothyroidism, bipolar disorder, tardive dyskinesia, who presented to the ED with complaints of a fall.   He notes that his left leg weakness and numbness started as left hip pain a few days ago. He has had multiple falls due to the weakness. He denies any other focal weakness. He endorses increased cough over the last few days in addition to increased shortness of breath. "  Further hospital course and management as outlined below.   Assessment & Plan:   Left leg weakness 2/2 left-sided lumbar radiculopathy due to disc herniation at L3/4 with possible involvement at L2-3  S/p epidural steroid injection by IR on 4/26 --finding per MRI --neurosurgery consulted, initially deemed pt not a surgical candidate due to COPD exacerbation. --neurosurgery saw pt 4/30 -- If pt continues to have substantial weakness that precludes walking, would consider surgical intervention as soon as 2 weeks after his epidural injection  --PT   * COPD exacerbation (HCC) Patient presenting with increased shortness of breath and cough over the last few days.   --90% on room air  --completed 5 days of prednisone 40 mg daily, and 3 days of Mucomyst neb BID. Plan: --DuoNeb scheduled  Hyponatremia - mild.  Last Na 132 --Encourage PO intake --Monitor BMP  Smoker --nicotine patch  Possible CAP --no fever, leukocytosis, procal neg.  CXR read as "patchy airspace disease at the left base suggesting pneumonia."  Personal review did not see obvious consolidation. --completed 2 days of ceftriaxone and azithromycin and 3 days of Levaquin for a 5-day course.  AKI (acute kidney injury) (HCC) --Cr 1.79 on presentation.   Baseline around 1.  Cr back to baseline the next day after IVF.  Schizophrenia (HCC) - stable - Continue home amitriptyline, and venlafaxine --cont home monthly Invega, last given 4/19   HTN (hypertension) - stable Patient has a history of hypertension currently on amlodipine and lisinopril.   Soft blood pressure noted by EMS with improvement with IV fluids. --cont home propranolol and amlodipine   DVT prophylaxis: Lovenox SQ Code Status: Full code  Family Communication:  Level of care: Med-Surg  Dispo:   The patient is from: home alone  Anticipated d/c is to: unknown / to be determined.   Unsafe discharge home, unable to ambulate or care for self, no caregiver support.  Does not have Medicare and unwilling to hand over checks (would lose his apartment), so SNF is not an option.  Remains admitted until able to safely d/c home and/or lumbar surgical intervention (cannot be done until at least two weeks after epidural steroid injection).   Subjective and Interval History:  Pt resting in bed when seen today.  He reports being aggravated by lab waking him up early AM to draw blood.  No other complaints besides the left leg pain is ongoing.  Encouraged he continue to work with therapy to regain independence and avoid progressive weakness so he is capable to return home when able.   Objective: Vitals:   08/24/22 1949 08/24/22 2354 08/25/22 0718 08/25/22 0839  BP:  130/77  131/85  Pulse:  71  75  Resp:  18  20  Temp:  98.1 F (36.7 C)  98.1 F (36.7 C)  TempSrc:  SpO2: 93% 92% 91% 91%  Weight:      Height:        Intake/Output Summary (Last 24 hours) at 08/25/2022 1314 Last data filed at 08/25/2022 1100 Gross per 24 hour  Intake 600 ml  Output 1500 ml  Net -900 ml   Filed Weights   08/16/22 1130  Weight: 90.7 kg    Examination:   General exam: awake, appears fatigued, no acute distress HEENT: moist mucus membranes, hearing grossly normal  Respiratory system: on  room air, normal respiratory effort. Cardiovascular system: RRR, no pedal edema.   Gastrointestinal system: soft, NT, ND Central nervous system: A&O x 3. no gross focal neurologic deficits, baseline dysarthric speech Extremities: LLE in brace, no edema, normal tone Skin: dry, intact, normal temperature Psychiatry: normal mood, congruent affect, judgement and insight appear normal    Data Reviewed: I have personally reviewed labs and imaging studies  Notable labs --  Na 132, Cl 95, glucose 118, BUN 31. Hbg stable 11.5    Time spent: 25 minutes  Pennie Banter, DO [facility] If 7PM-7AM, please contact night-coverage 08/25/2022, 1:14 PM

## 2022-08-25 NOTE — Plan of Care (Signed)

## 2022-08-25 NOTE — Progress Notes (Signed)
Occupational Therapy Treatment Patient Details Name: Christopher Mason MRN: [id number] DOB: Apr 05, 1961 Today's Date: 08/25/2022   History of present illness Pt is a 62 y.o. male with medical history significant of COPD, schizophrenia, previous spinal surgery, hypothyroidism, hypothyroidism, bipolar disorder, tardive dyskinesia, who presents to the ED with complaints of falls.  MD assessment includes: COPD exacerbation, CAP, AKI, and LLE weakness. MRI impression includes: multilevel lumbar spondylosis, worst at L3-4, where a left subarticular disc extrusion results in compression of the traversing left L4 nerve root in the left lateral recess.  Moderate left neural foraminal narrowing at this level, displacement of the traversing left L3 nerve root in the left lateral recess at L2-3, and severe bilateral neural foraminal narrowing at L5-S1.   OT comments  Pt seen for OT tx, pt agreeable. Denies pain or SOB at rest. Completed bed mobility with +time/effort to complete and use of bed rail. Pt further educated in pursed lip breathing technique with VC for utilizing and for proper technique as he tends to hold his breath during exertional activity and also tends to hold his breath after inhaling before exhaling. Visual and verbal cues utilized to enhance understanding. Pt on 3L with SpO2 ranging 86-91% with bed mobility and improved with use of PLB and time. Continues to benefit, continue with OT POC.    Recommendations for follow up therapy are one component of a multi-disciplinary discharge planning process, led by the attending physician.  Recommendations may be updated based on patient status, additional functional criteria and insurance authorization.    Assistance Recommended at Discharge Frequent or constant Supervision/Assistance  Patient can return home with the following  A lot of help with bathing/dressing/bathroom;Assistance with cooking/housework;Assist for transportation;Help with stairs or  ramp for entrance;Direct supervision/assist for medications management;A little help with walking and/or transfers   Equipment Recommendations  BSC/3in1    Recommendations for Other Services      Precautions / Restrictions Precautions Precautions: Fall Restrictions Weight Bearing Restrictions: No Other Position/Activity Restrictions: OK to use LLE KI as needed to the LLE       Mobility Bed Mobility Overal bed mobility: Needs Assistance Bed Mobility: Supine to Sit, Sit to Supine     Supine to sit: Supervision Sit to supine: Supervision   General bed mobility comments: +time/effort to complete with use of bed rail but no direct assist required    Transfers Overall transfer level: Needs assistance   Transfers: Bed to chair/wheelchair/BSC            Lateral/Scoot Transfers: Modified independent (Device/Increase time) General transfer comment: VC for PLB throughout as he tends to hold his breath     Balance Overall balance assessment: Needs assistance, History of Falls Sitting-balance support: Feet supported Sitting balance-Leahy Scale: Good                                     ADL either performed or assessed with clinical judgement   ADL                                              Extremity/Trunk Assessment              Vision       Perception     Praxis      Cognition Arousal/Alertness: Awake/alert  Behavior During Therapy: WFL for tasks assessed/performed Overall Cognitive Status: Within Functional Limits for tasks assessed                                          Exercises Other Exercises Other Exercises: Pt further educated in pursed lip breathing technique with VC for utilizing and for proper technique as he tends to hold his breath during exertional activity and also tends to hold his breath after inhaling before exhaling. Visual and verbal cues utilized to enhance understanding     Shoulder Instructions       General Comments 3L, SpO2 86-91% with bed mobility and improved with use of PLB and time    Pertinent Vitals/ Pain          Home Living                                          Prior Functioning/Environment              Frequency  Min 1X/week        Progress Toward Goals  OT Goals(current goals can now be found in the care plan section)  Progress towards OT goals: Progressing toward goals  Acute Rehab OT Goals Patient Stated Goal: return home OT Goal Formulation: With patient Time For Goal Achievement: 08/30/22 Potential to Achieve Goals: Good  Plan Discharge plan remains appropriate;Frequency remains appropriate    Co-evaluation                 AM-PAC OT "6 Clicks" Daily Activity     Outcome Measure   Help from another person eating meals?: None Help from another person taking care of personal grooming?: A Little Help from another person toileting, which includes using toliet, bedpan, or urinal?: A Little Help from another person bathing (including washing, rinsing, drying)?: A Lot Help from another person to put on and taking off regular upper body clothing?: A Little Help from another person to put on and taking off regular lower body clothing?: A Lot 6 Click Score: 17    End of Session Equipment Utilized During Treatment: Oxygen  OT Visit Diagnosis: Unsteadiness on feet (R26.81);Muscle weakness (generalized) (M62.81);History of falling (Z91.81)   Activity Tolerance Patient tolerated treatment well   Patient Left in bed;with call bell/phone within reach;with bed alarm set   Nurse Communication          Time: 1610-9604 OT Time Calculation (min): 14 min  Charges: OT General Charges $OT Visit: 1 Visit OT Treatments $Self Care/Home Management : 8-22 mins  Arman Filter., MPH, MS, OTR/L ascom [phone] 08/25/22, 4:44 PM

## 2022-08-26 DIAGNOSIS — J441 Chronic obstructive pulmonary disease with (acute) exacerbation: Secondary | ICD-10-CM | POA: Diagnosis not present

## 2022-08-26 MED ORDER — IPRATROPIUM-ALBUTEROL 0.5-2.5 (3) MG/3ML IN SOLN
3.0000 mL | RESPIRATORY_TRACT | Status: DC | PRN
  Administered 2022-08-26: 3 mL via RESPIRATORY_TRACT
  Filled 2022-08-26: qty 3

## 2022-08-26 NOTE — Progress Notes (Signed)
PROGRESS NOTE    Christopher Mason  ZOX:[id number] DOB: Nov 03, 1960 DOA: 08/16/2022 PCP: Sallyanne Kuster, NP  [id number]  LOS: 9 days   Brief hospital course: "Christopher Mason is a 62 y.o. male with medical history significant of COPD, schizophrenia, multiple previous spinal surgery, hypothyroidism, bipolar disorder, tardive dyskinesia, who presented to the ED with complaints of a fall.   He notes that his left leg weakness and numbness started as left hip pain a few days ago. He has had multiple falls due to the weakness. He denies any other focal weakness. He endorses increased cough over the last few days in addition to increased shortness of breath. "  Further hospital course and management as outlined below.   Assessment & Plan:   Left leg weakness 2/2 left-sided lumbar radiculopathy due to disc herniation at L3/4 with possible involvement at L2-3  S/p epidural steroid injection by IR on 4/26 --finding per MRI --neurosurgery consulted, initially deemed pt not a surgical candidate due to COPD exacerbation. --neurosurgery saw pt 4/30 -- If pt continues to have substantial weakness that precludes walking, would consider surgical intervention as soon as 2 weeks after his epidural injection  --PT   * COPD exacerbation (HCC) Patient presenting with increased shortness of breath and cough over the last few days.   --90% on room air  --completed 5 days of prednisone 40 mg daily, and 3 days of Mucomyst neb BID. Plan: --DuoNeb scheduled  Hyponatremia - mild.  Last Na 132 --Encourage PO intake --Monitor BMP  Smoker --nicotine patch  Possible CAP --no fever, leukocytosis, procal neg.  CXR read as "patchy airspace disease at the left base suggesting pneumonia."  Personal review did not see obvious consolidation. --completed 2 days of ceftriaxone and azithromycin and 3 days of Levaquin for a 5-day course.  AKI (acute kidney injury) (HCC) --Cr 1.79 on presentation.   Baseline around 1.  Cr back to baseline the next day after IVF.  Schizophrenia (HCC) - stable - Continue home amitriptyline, and venlafaxine --cont home monthly Invega, last given 4/19   HTN (hypertension) - stable Patient has a history of hypertension currently on amlodipine and lisinopril.   Soft blood pressure noted by EMS with improvement with IV fluids. --cont home propranolol and amlodipine   DVT prophylaxis: Lovenox SQ Code Status: Full code  Family Communication:  Level of care: Med-Surg  Dispo:   The patient is from: home alone  Anticipated d/c is to: unknown / to be determined.   Unsafe discharge home, unable to ambulate or care for self, no caregiver support.  Does not have Medicare and unwilling to hand over checks (would lose his apartment), so SNF is not an option.  Remains admitted until able to safely d/c home and/or lumbar surgical intervention (cannot be done until at least two weeks after epidural steroid injection).   Subjective and Interval History:  Pt sleeping soundly when seen.  Woke up briefly, no acute complaints.   Still needing significant amount of assistance for functional mobility.  Unsafe to d/c home.   Objective: Vitals:   08/25/22 2300 08/26/22 0019 08/26/22 0356 08/26/22 0756  BP: 130/77 117/65 (!) 125/45 122/80  Pulse: 71 (!) 59 67 73  Resp: 18 18 20  (!) 21  Temp: 98.1 F (36.7 C) 98.1 F (36.7 C) 98.1 F (36.7 C) 98.1 F (36.7 C)  TempSrc: Oral     SpO2: 92% 96% 96% 92%  Weight:      Height:  Intake/Output Summary (Last 24 hours) at 08/26/2022 1239 Last data filed at 08/26/2022 1000 Gross per 24 hour  Intake 1200 ml  Output 1550 ml  Net -350 ml   Filed Weights   08/16/22 1130  Weight: 90.7 kg    Examination:   General exam: sleeping comfortably, no acute distress HEENT: moist mucus membranes, hearing grossly normal  Respiratory system: on room air, normal respiratory effort, nasal cannula in place. Cardiovascular  system: RRR, no pedal edema.   Gastrointestinal system: protuberant abdomen, +bowel sounds Central nervous system: exam limited by somnolence Extremities: LLE in brace, no edema, normal tone Skin: dry, intact, normal temperature Psychiatry: exam limited by somnolence    Data Reviewed: I have personally reviewed labs and imaging studies  Notable labs --  Na 132, Cl 95, glucose 118, BUN 31. Hbg stable 11.5    Time spent: 25 minutes  Pennie Banter, DO [facility] If 7PM-7AM, please contact night-coverage 08/26/2022, 12:39 PM

## 2022-08-26 NOTE — Plan of Care (Signed)

## 2022-08-27 DIAGNOSIS — J441 Chronic obstructive pulmonary disease with (acute) exacerbation: Secondary | ICD-10-CM | POA: Diagnosis not present

## 2022-08-27 LAB — BASIC METABOLIC PANEL
Anion gap: 7 (ref 5–15)
BUN: 24 mg/dL — ABNORMAL HIGH (ref 8–23)
CO2: 27 mmol/L (ref 22–32)
Calcium: 8.9 mg/dL (ref 8.9–10.3)
Chloride: 98 mmol/L (ref 98–111)
Creatinine, Ser: 0.94 mg/dL (ref 0.61–1.24)
GFR, Estimated: >60 mL/min (ref >60)
Glucose, Bld: 127 mg/dL — ABNORMAL HIGH (ref 70–99)
Potassium: 4.1 mmol/L (ref 3.5–5.1)
Sodium: 132 mmol/L — ABNORMAL LOW (ref 135–145)

## 2022-08-27 NOTE — Progress Notes (Signed)
PROGRESS NOTE    Christopher Mason  BTD:[id number] DOB: November 01, 1960 DOA: 08/16/2022 PCP: Sallyanne Kuster, NP  [id number]  LOS: 9 days   Brief hospital course: "Christopher Mason is a 62 y.o. male with medical history significant of COPD, schizophrenia, multiple previous spinal surgery, hypothyroidism, bipolar disorder, tardive dyskinesia, who presented to the ED with complaints of a fall.   He notes that his left leg weakness and numbness started as left hip pain a few days ago. He has had multiple falls due to the weakness. He denies any other focal weakness. He endorses increased cough over the last few days in addition to increased shortness of breath. "  Further hospital course and management as outlined below.   Assessment & Plan:   Left leg weakness 2/2 left-sided lumbar radiculopathy due to disc herniation at L3/4 with possible involvement at L2-3  S/p epidural steroid injection by IR on 4/26 --finding per MRI --neurosurgery consulted, initially deemed pt not a surgical candidate due to COPD exacerbation. --neurosurgery saw pt 4/30 -- If pt continues to have substantial weakness that precludes walking, would consider surgical intervention as soon as 2 weeks after his epidural injection  --PT   * COPD exacerbation - now stable Patient presenting with increased shortness of breath and cough over the last few days.   --90% on room air  --completed 5 days of prednisone 40 mg daily, and 3 days of Mucomyst neb BID. Plan: --DuoNeb PRN  Hyponatremia - mild.  Last Na 132 --Encourage PO intake --Monitor BMP  Smoker --nicotine patch  Possible CAP --no fever, leukocytosis, procal neg.  CXR read as "patchy airspace disease at the left base suggesting pneumonia."  Personal review did not see obvious consolidation. --completed 2 days of ceftriaxone and azithromycin and 3 days of Levaquin for a 5-day course.  AKI (acute kidney injury) (HCC) --Cr 1.79 on presentation.   Baseline around 1.  Cr back to baseline the next day after IVF.  Schizophrenia (HCC) - stable - Continue home amitriptyline, and venlafaxine --cont home monthly Invega, last given 4/19   HTN (hypertension) - stable Patient has a history of hypertension currently on amlodipine and lisinopril.   Soft blood pressure noted by EMS with improvement with IV fluids. --cont home propranolol and amlodipine  Hypothyroidism --continue levothyroxine  BPH --Flomax, Toviaz  Insomnia --Trazodone   DVT prophylaxis: Lovenox SQ Code Status: Full code  Family Communication:  Level of care: Med-Surg  Dispo:   The patient is from: home alone  Anticipated d/c is to: unknown / to be determined.   Unsafe discharge home, unable to ambulate or care for self, no caregiver support.  Does not have Medicare and unwilling to hand over checks (would lose his apartment), so SNF is not an option.  Remains admitted until able to safely d/c home and/or lumbar surgical intervention (cannot be done until at least two weeks after epidural steroid injection).   Subjective and Interval History:  Pt sleeping soundly when seen.  He woke to voice, reported being very tired. No other acute complaints.    Objective: Vitals:   08/25/22 2300 08/26/22 0019 08/26/22 0356 08/26/22 0756  BP: 130/77 117/65 (!) 125/45 122/80  Pulse: 71 (!) 59 67 73  Resp: 18 18 20  (!) 21  Temp: 98.1 F (36.7 C) 98.1 F (36.7 C) 98.1 F (36.7 C) 98.1 F (36.7 C)  TempSrc: Oral     SpO2: 92% 96% 96% 92%  Weight:      Height:  Intake/Output Summary (Last 24 hours) at 08/26/2022 1239 Last data filed at 08/26/2022 1000 Gross per 24 hour  Intake 1200 ml  Output 1550 ml  Net -350 ml   Filed Weights   08/16/22 1130  Weight: 90.7 kg    Examination:   General exam: sleeping comfortably, woke to voice, no acute distress HEENT: moist mucus membranes, hearing grossly normal  Respiratory system: on room air, normal respiratory  effort, nasal cannula in place. Cardiovascular system: RRR, no pedal edema.   Gastrointestinal system: protuberant abdomen, +bowel sounds Central nervous system: grossly non-focal, baseline abnormal speech Extremities: LLE in brace, no edema, normal tone Skin: dry, intact, normal temperature Psychiatry: exam limited by somnolence    Data Reviewed: I have personally reviewed labs and imaging studies No new labs this AM   Last Notable labs --  Na 132, Cl 95, glucose 118, BUN 31. Hbg stable 11.5    Time spent: 25 minutes  Pennie Banter, DO [facility] If 7PM-7AM, please contact night-coverage 08/26/2022, 12:39 PM

## 2022-08-28 ENCOUNTER — Inpatient Hospital Stay: Payer: Medicaid Other

## 2022-08-28 DIAGNOSIS — J441 Chronic obstructive pulmonary disease with (acute) exacerbation: Secondary | ICD-10-CM | POA: Diagnosis not present

## 2022-08-28 LAB — BRAIN NATRIURETIC PEPTIDE: B Natriuretic Peptide: 47.3 pg/mL (ref 0.0–100.0)

## 2022-08-28 MED ORDER — METHOCARBAMOL 500 MG PO TABS
750.0000 mg | ORAL_TABLET | Freq: Three times a day (TID) | ORAL | Status: DC | PRN
  Administered 2022-08-31 – 2022-09-12 (×8): 750 mg via ORAL
  Filled 2022-08-28 (×9): qty 2

## 2022-08-28 MED ORDER — MELOXICAM 7.5 MG PO TABS
15.0000 mg | ORAL_TABLET | Freq: Every day | ORAL | Status: DC
  Administered 2022-08-29 – 2022-08-30 (×2): 15 mg via ORAL
  Filled 2022-08-28 (×2): qty 2

## 2022-08-28 MED ORDER — ALBUTEROL SULFATE (2.5 MG/3ML) 0.083% IN NEBU
2.5000 mg | INHALATION_SOLUTION | Freq: Four times a day (QID) | RESPIRATORY_TRACT | Status: DC | PRN
  Administered 2022-08-28: 2.5 mg via RESPIRATORY_TRACT
  Filled 2022-08-28: qty 3

## 2022-08-28 NOTE — Plan of Care (Signed)
  Problem: Clinical Measurements: Goal: Ability to maintain clinical measurements within normal limits will improve Outcome: Progressing Goal: Will remain free from infection Outcome: Progressing Goal: Respiratory complications will improve Outcome: Progressing   Problem: Activity: Goal: Risk for activity intolerance will decrease Outcome: Progressing   Problem: Pain Managment: Goal: General experience of comfort will improve Outcome: Progressing   Problem: Safety: Goal: Ability to remain free from injury will improve Outcome: Progressing   Problem: Skin Integrity: Goal: Risk for impaired skin integrity will decrease Outcome: Progressing   

## 2022-08-28 NOTE — Progress Notes (Addendum)
Physical Therapy Treatment Patient Details Name: Christopher Mason MRN: [id number] DOB: [birth year] Today's Date: 08/28/2022   History of Present Illness Pt is a 61 y.o. male with medical history significant of COPD, schizophrenia, previous spinal surgery, hypothyroidism, hypothyroidism, bipolar disorder, tardive dyskinesia, who presents to the ED with complaints of falls.  MD assessment includes: COPD exacerbation, CAP, AKI, and LLE weakness. MRI impression includes: multilevel lumbar spondylosis, worst at L3-4, where a left subarticular disc extrusion results in compression of the traversing left L4 nerve root in the left lateral recess.  Moderate left neural foraminal narrowing at this level, displacement of the traversing left L3 nerve root in the left lateral recess at L2-3, and severe bilateral neural foraminal narrowing at L5-S1.    PT Comments    Pt asleep on entry, agreeable to session. Pt is assisted to EOB c ModA, able to get balanced quickly on his own. O2 sats ~89% on 3.5L on entry, remain there after sitting EOB for several minutes. Pt left on 5L/min in recliner at 93% SpO2. Pt assisted with Left KI, unable to don on his own, then able to rise form elevated EOB 4x with 30sec standing each time, balancing with RW. Pt assisted with xfer to recliner in anticipation of meal. Pt left up in chair, all needs met, alarm armed.      Recommendations for follow up therapy are one component of a multi-disciplinary discharge planning process, led by the attending physician.  Recommendations may be updated based on patient status, additional functional criteria and insurance authorization.  Follow Up Recommendations  Can patient physically be transported by private vehicle: No    Assistance Recommended at Discharge Frequent or constant Supervision/Assistance  Patient can return home with the following A lot of help with walking and/or transfers;A lot of help with  bathing/dressing/bathroom;Assistance with cooking/housework;Direct supervision/assist for medications management;Assist for transportation;Help with stairs or ramp for entrance   Equipment Recommendations  Rolling walker (2 wheels);BSC/3in1;Wheelchair (measurements PT);Wheelchair cushion (measurements PT);Hospital bed    Recommendations for Other Services       Precautions / Restrictions Precautions Precautions: Fall Required Braces or Orthoses: Knee Immobilizer - Left Knee Immobilizer - Left: On when out of bed or walking Restrictions Other Position/Activity Restrictions: OK to use LLE KI as needed to the LLE     Mobility  Bed Mobility Overal bed mobility: Needs Assistance Bed Mobility: Supine to Sit     Supine to sit: Mod assist, Min assist          Transfers Overall transfer level: Needs assistance Equipment used: Rolling walker (2 wheels) Transfers: Bed to chair/wheelchair/BSC Sit to Stand: From elevated surface, Min guard   Step pivot transfers: Min guard            Ambulation/Gait                   Stairs             Wheelchair Mobility    Modified Rankin (Stroke Patients Only)       Balance                                            Cognition Arousal/Alertness: Awake/alert Behavior During Therapy: WFL for tasks assessed/performed Overall Cognitive Status: Within Functional Limits for tasks assessed  Exercises General Exercises - Lower Extremity Heel Slides: AROM, Right, 15 reps, Supine Hip Flexion/Marching: AROM, 10 reps, Right, Supine Mini-Sqauts: Right, 10 reps, Supine, Strengthening Other Exercises Other Exercises: STS from elevated EOB, Left KI, RW: 3x30sec, then 4th x for step pivot to recliner    General Comments        Pertinent Vitals/Pain Pain Assessment Pain Assessment: 0-10 Pain Score: 10-Worst pain ever Pain Location: LLE Pain  Intervention(s): Limited activity within patient's tolerance, Monitored during session, Premedicated before session, Repositioned    Home Living                          Prior Function            PT Goals (current goals can now be found in the care plan section) Acute Rehab PT Goals Patient Stated Goal: To walk better PT Goal Formulation: With patient Time For Goal Achievement: 08/29/22 Potential to Achieve Goals: Good Progress towards PT goals: Progressing toward goals    Frequency    Min 3X/week      PT Plan Current plan remains appropriate    Co-evaluation              AM-PAC PT "6 Clicks" Mobility   Outcome Measure  Help needed turning from your back to your side while in a flat bed without using bedrails?: A Lot Help needed moving from lying on your back to sitting on the side of a flat bed without using bedrails?: A Lot Help needed moving to and from a bed to a chair (including a wheelchair)?: A Lot Help needed standing up from a chair using your arms (e.g., wheelchair or bedside chair)?: A Lot Help needed to walk in hospital room?: A Little Help needed climbing 3-5 steps with a railing? : A Lot 6 Click Score: 13    End of Session Equipment Utilized During Treatment: Left knee immobilizer Activity Tolerance: Patient tolerated treatment well;No increased pain;Patient limited by fatigue Patient left: with call bell/phone within reach;in chair;with chair alarm set Nurse Communication: Mobility status PT Visit Diagnosis: History of falling (Z91.81);Unsteadiness on feet (R26.81);Muscle weakness (generalized) (M62.81);Difficulty in walking, not elsewhere classified (R26.2);Pain;Other abnormalities of gait and mobility (R26.89) Pain - Right/Left: Left Pain - part of body: Leg     Time: 1914-7829 PT Time Calculation (min) (ACUTE ONLY): 28 min  Charges:  $Therapeutic Activity: 23-37 mins                    11:52 AM, 08/28/22 [name], PT,  DPT Physical Therapist - [facility]  [phone] (ASCOM)    [name] 08/28/2022, 11:52 AM

## 2022-08-28 NOTE — Plan of Care (Signed)

## 2022-08-28 NOTE — TOC Progression Note (Signed)
Transition of Care [facility]) - Progression Note    Patient Details  Name: Christopher Mason MRN: [id number] Date of Birth: [birth year]  [facility]) CM/SW Contact  Marlowe Sax, RN Phone Number: 08/28/2022, 9:40 AM  Clinical Narrative:   TOC continues to follow and assist with DC planning, He will possibly get Surgery for back Not able to do so until 2 weeks after steroid injection He remains an unsafe DC unable to care for self and ambulate safely, unable to go to SNF due to having to turn over check and then would lose his home       Barriers to Discharge: Continued Medical Work up  Expected Discharge Plan and Services   Discharge Planning Services: CM Consult                     DME Arranged: N/A DME Agency: NA                   Social Determinants of Health (SDOH) Interventions SDOH Screenings   Food Insecurity: No Food Insecurity (08/16/2022)  Housing: Low Risk  (08/16/2022)  Transportation Needs: No Transportation Needs (08/16/2022)  Utilities: Not At Risk (08/16/2022)  Alcohol Screen: Low Risk  (09/06/2021)  Depression (PHQ2-9): Low Risk  (09/06/2021)  Tobacco Use: High Risk (08/18/2022)    Readmission Risk Interventions     No data to display

## 2022-08-28 NOTE — Progress Notes (Signed)
PROGRESS NOTE    Christopher Mason  WUJ:[id number] DOB: April 16, 1961 DOA: 08/16/2022 PCP: Sallyanne Kuster, NP  [id number]  LOS: 11 days   Brief hospital course: "Christopher Mason is a 62 y.o. male with medical history significant of COPD, schizophrenia, multiple previous spinal surgery, hypothyroidism, bipolar disorder, tardive dyskinesia, who presented to the ED with complaints of a fall.   He notes that his left leg weakness and numbness started as left hip pain a few days ago. He has had multiple falls due to the weakness. He denies any other focal weakness. He endorses increased cough over the last few days in addition to increased shortness of breath. "  Further hospital course and management as outlined below.   Assessment & Plan:   Left leg weakness 2/2 left-sided lumbar radiculopathy due to disc herniation at L3/4 with possible involvement at L2-3  S/p epidural steroid injection by IR on 4/26 --finding per MRI --neurosurgery consulted, initially deemed pt not a surgical candidate due to COPD exacerbation. --neurosurgery saw pt 4/30 -- If pt continues to have substantial weakness that precludes walking, would consider surgical intervention as soon as 2 weeks after his epidural injection  --PT   Acute Respiratory Failure with Hypoxia -- pt has been on 3 L/min [place] o2, no home O2 need per pt. 5/6 - mildly low Na, 1+ edema in legs. Not wheezing Suspect multifactorial due to COPD and ?CHF --Chest xray --BNP --May need diuresis --No echo available, will see what xray and BNP show first  * COPD exacerbation - now stable Patient presenting with increased shortness of breath and cough over the last few days.   --90% on room air  --completed 5 days of prednisone 40 mg daily, and 3 days of Mucomyst neb BID. Plan: --DuoNeb PRN  Hyponatremia - mild.  Last Na 132.  On exam, mildly hypervolemic. --Encourage PO intake --Monitor BMP  Smoker --nicotine patch  Possible  CAP --no fever, leukocytosis, procal neg.  CXR read as "patchy airspace disease at the left base suggesting pneumonia."  Personal review did not see obvious consolidation. --completed 2 days of ceftriaxone and azithromycin and 3 days of Levaquin for a 5-day course.  AKI (acute kidney injury) (HCC) --Cr 1.79 on presentation.  Baseline around 1.  Cr back to baseline the next day after IVF.  Schizophrenia (HCC) - stable - Continue home amitriptyline, and venlafaxine --cont home monthly Invega, last given 4/19   HTN (hypertension) - stable Patient has a history of hypertension currently on amlodipine and lisinopril.   Soft blood pressure noted by EMS with improvement with IV fluids. --cont home propranolol and amlodipine  Hypothyroidism --continue levothyroxine  BPH --Flomax, Toviaz  Insomnia --Trazodone   DVT prophylaxis: Lovenox SQ Code Status: Full code  Family Communication:  Level of care: Med-Surg  Dispo:   The patient is from: home alone  Anticipated d/c is to: unknown / to be determined.   Unsafe discharge home, unable to ambulate or care for self, no caregiver support.  Does not have Medicare and unwilling to hand over checks (would lose his apartment), so SNF is not an option.  Remains admitted until able to safely d/c home and/or lumbar surgical intervention (cannot be done until at least two weeks after epidural steroid injection).   Subjective and Interval History:  Pt up in recliner when seen this AM.  He denies using O2 at home.  Feels short of breath, he says both at rest and on exertion.  No cough  or congestion, fever or chills.  No other acute complaints but states he still needs to get his legs back.   Objective: Vitals:   08/27/22 1525 08/27/22 2206 08/28/22 0850 08/28/22 0903  BP: 113/81 (!) 135/94 130/82   Pulse: 73 72 72   Resp: 18 20 18    Temp: 97.7 F (36.5 C) 98.1 F (36.7 C) 98.2 F (36.8 C)   TempSrc:  Oral    SpO2: 91% 94% 93% 93%   Weight:      Height:        Intake/Output Summary (Last 24 hours) at 08/28/2022 1324 Last data filed at 08/28/2022 1000 Gross per 24 hour  Intake 1194 ml  Output 1750 ml  Net -556 ml   Filed Weights   08/16/22 1130  Weight: 90.7 kg    Examination:   General exam: awake, alert, no acute distress HEENT: moist mucus membranes, hearing grossly normal  Respiratory system: on room air, normal respiratory effort, nasal cannula in place. Cardiovascular system: RRR, no pedal edema.   Gastrointestinal system: protuberant abdomen, +bowel sounds Central nervous system: A&Ox3, grossly non-focal, baseline abnormal speech Extremities: LLE in brace, no edema, normal tone Skin: dry, intact, normal temperature Psychiatry: normal mood and affect    Data Reviewed: I have personally reviewed labs and imaging studies  Notable labs -- Na 132, glucose 127, BUN 24    Time spent: 42 minutes  Pennie Banter, DO [facility] If 7PM-7AM, please contact night-coverage 08/28/2022, 1:24 PM

## 2022-08-28 NOTE — Plan of Care (Signed)
  Problem: Clinical Measurements: Goal: Ability to maintain clinical measurements within normal limits will improve Outcome: Progressing Goal: Respiratory complications will improve Outcome: Progressing   Problem: Activity: Goal: Risk for activity intolerance will decrease Outcome: Progressing   Problem: Pain Managment: Goal: General experience of comfort will improve Outcome: Progressing   Problem: Safety: Goal: Ability to remain free from injury will improve Outcome: Progressing   

## 2022-08-29 ENCOUNTER — Inpatient Hospital Stay (HOSPITAL_COMMUNITY)
Admit: 2022-08-29 | Discharge: 2022-08-29 | Disposition: A | Discharge status: Home / Self Care | Payer: Medicaid Other | Attending: Internal Medicine | Admitting: Internal Medicine

## 2022-08-29 DIAGNOSIS — I5031 Acute diastolic (congestive) heart failure: Secondary | ICD-10-CM

## 2022-08-29 DIAGNOSIS — J441 Chronic obstructive pulmonary disease with (acute) exacerbation: Secondary | ICD-10-CM | POA: Diagnosis not present

## 2022-08-29 LAB — ECHOCARDIOGRAM COMPLETE: Height: 67 in

## 2022-08-29 MED ORDER — IPRATROPIUM-ALBUTEROL 0.5-2.5 (3) MG/3ML IN SOLN
3.0000 mL | Freq: Three times a day (TID) | RESPIRATORY_TRACT | Status: DC
  Administered 2022-08-29 – 2022-09-08 (×28): 3 mL via RESPIRATORY_TRACT
  Filled 2022-08-29 (×28): qty 3

## 2022-08-29 MED ORDER — IPRATROPIUM-ALBUTEROL 0.5-2.5 (3) MG/3ML IN SOLN: 3.0000 mL | Freq: Three times a day (TID) | RESPIRATORY_TRACT | Status: DC

## 2022-08-29 MED ORDER — FUROSEMIDE 10 MG/ML IJ SOLN
20.0000 mg | Freq: Once | INTRAMUSCULAR | Status: AC
  Administered 2022-08-29: 20 mg via INTRAVENOUS
  Filled 2022-08-29: qty 4

## 2022-08-29 MED ORDER — FUROSEMIDE 10 MG/ML IJ SOLN
20.0000 mg | Freq: Two times a day (BID) | INTRAMUSCULAR | Status: DC
  Administered 2022-08-29 – 2022-09-01 (×6): 20 mg via INTRAVENOUS
  Filled 2022-08-29 (×6): qty 4

## 2022-08-29 MED ORDER — ALBUTEROL SULFATE (2.5 MG/3ML) 0.083% IN NEBU
2.5000 mg | INHALATION_SOLUTION | RESPIRATORY_TRACT | Status: DC | PRN
  Administered 2022-09-05 – 2022-09-16 (×2): 2.5 mg via RESPIRATORY_TRACT
  Filled 2022-08-29 (×2): qty 3

## 2022-08-29 NOTE — Progress Notes (Signed)
Physical Therapy Treatment Patient Details Name: Christopher Mason MRN: [id number] DOB: [birth year] Today's Date: 08/29/2022   History of Present Illness Pt is a 62 y.o. male with medical history significant of COPD, schizophrenia, previous spinal surgery, hypothyroidism, hypothyroidism, bipolar disorder, tardive dyskinesia, who presents to the ED with complaints of falls.  MD assessment includes: COPD exacerbation, CAP, AKI, and LLE weakness. MRI impression includes: multilevel lumbar spondylosis, worst at L3-4, where a left subarticular disc extrusion results in compression of the traversing left L4 nerve root in the left lateral recess.  Moderate left neural foraminal narrowing at this level, displacement of the traversing left L3 nerve root in the left lateral recess at L2-3, and severe bilateral neural foraminal narrowing at L5-S1.    PT Comments    Patient agreeable to PT. He reports he just got back to bed and declined getting up again at this time. He was agreeable to in bed exercises for strengthening which were performed with assistance. Patient complains of left thigh pain and low back pain. Recommend to continue PT to maximize independence and facilitate return to prior level of function.    Recommendations for follow up therapy are one component of a multi-disciplinary discharge planning process, led by the attending physician.  Recommendations may be updated based on patient status, additional functional criteria and insurance authorization.  Follow Up Recommendations  Can patient physically be transported by private vehicle: No    Assistance Recommended at Discharge Frequent or constant Supervision/Assistance  Patient can return home with the following A lot of help with walking and/or transfers;A lot of help with bathing/dressing/bathroom;Assistance with cooking/housework;Direct supervision/assist for medications management;Assist for transportation;Help with stairs or ramp for  entrance   Equipment Recommendations  Rolling walker (2 wheels);BSC/3in1;Wheelchair (measurements PT);Wheelchair cushion (measurements PT);Hospital bed    Recommendations for Other Services       Precautions / Restrictions Precautions Precautions: Fall Restrictions Weight Bearing Restrictions: No     Mobility  Bed Mobility               General bed mobility comments: patient declined getting OOB as he just got back to bed with staff assistance    Transfers                        Ambulation/Gait                   Stairs             Wheelchair Mobility    Modified Rankin (Stroke Patients Only)       Balance                                            Cognition Arousal/Alertness: Awake/alert Behavior During Therapy: WFL for tasks assessed/performed Overall Cognitive Status: Within Functional Limits for tasks assessed                                          Exercises Total Joint Exercises Ankle Circles/Pumps: AROM, Strengthening, Both, 10 reps, Supine Quad Sets: AROM, Strengthening, Left, 10 reps, Supine Heel Slides: AAROM, Strengthening, Both, 10 reps, Supine Hip ABduction/ADduction: AAROM, Strengthening, Both, 10 reps, Supine Other Exercises Other Exercises: mild pain reported in left upper thigh with movement  General Comments General comments (skin integrity, edema, etc.): patient agreeable to in bed exercises for strengthening. encourage patient to perform LE exercises in bed as able      Pertinent Vitals/Pain Pain Assessment Pain Assessment: Faces Faces Pain Scale: Hurts little more Pain Location: left upper thigh, low back Pain Descriptors / Indicators: Sore Pain Intervention(s): Limited activity within patient's tolerance, Monitored during session, Repositioned    Home Living                          Prior Function            PT Goals (current goals can now be  found in the care plan section) Acute Rehab PT Goals Patient Stated Goal: To walk better PT Goal Formulation: With patient Time For Goal Achievement: 09/12/22 Potential to Achieve Goals: Good Progress towards PT goals: Progressing toward goals    Frequency    Min 3X/week      PT Plan Current plan remains appropriate (care plan extended x 2 week, goas appropriate)    Co-evaluation              AM-PAC PT "6 Clicks" Mobility   Outcome Measure  Help needed turning from your back to your side while in a flat bed without using bedrails?: A Lot Help needed moving from lying on your back to sitting on the side of a flat bed without using bedrails?: A Lot Help needed moving to and from a bed to a chair (including a wheelchair)?: A Lot Help needed standing up from a chair using your arms (e.g., wheelchair or bedside chair)?: A Lot Help needed to walk in hospital room?: A Little Help needed climbing 3-5 steps with a railing? : A Lot 6 Click Score: 13    End of Session Equipment Utilized During Treatment: Oxygen Activity Tolerance: Patient limited by fatigue Patient left: in bed;with call bell/phone within reach;with bed alarm set Nurse Communication: Other (comment) (white board up to date with mobility status) PT Visit Diagnosis: History of falling (Z91.81);Unsteadiness on feet (R26.81);Muscle weakness (generalized) (M62.81);Difficulty in walking, not elsewhere classified (R26.2);Pain;Other abnormalities of gait and mobility (R26.89) Pain - Right/Left: Left Pain - part of body: Leg     Time: 4166-0630 PT Time Calculation (min) (ACUTE ONLY): 17 min  Charges:  $Therapeutic Exercise: 8-22 mins                    Donna Bernard, PT, MPT    Ina Homes 08/29/2022, 3:17 PM

## 2022-08-29 NOTE — Progress Notes (Signed)
PROGRESS NOTE    Christopher Mason  XBJ:[id number] DOB: 01-Sep-1960 DOA: 08/16/2022 PCP: Sallyanne Kuster, NP  [id number]  LOS: 12 days   Brief hospital course: "Christopher Mason is a 62 y.o. male with medical history significant of COPD, schizophrenia, multiple previous spinal surgery, hypothyroidism, bipolar disorder, tardive dyskinesia, who presented to the ED with complaints of a fall.   He notes that his left leg weakness and numbness started as left hip pain a few days ago. He has had multiple falls due to the weakness. He denies any other focal weakness. He endorses increased cough over the last few days in addition to increased shortness of breath. "  Further hospital course and management as outlined below.   Assessment & Plan:   Left leg weakness 2/2 left-sided lumbar radiculopathy due to disc herniation at L3/4 with possible involvement at L2-3  S/p epidural steroid injection by IR on 4/26 --finding per MRI --neurosurgery consulted, initially deemed pt not a surgical candidate due to COPD exacerbation. --neurosurgery saw pt 4/30 -- If pt continues to have substantial weakness that precludes walking, would consider surgical intervention as soon as 2 weeks after his epidural injection  --PT   Acute Respiratory Failure with Hypoxia -- pt has been on 3 L/min [place] o2, no home O2 need per pt. 5/6 - mildly low Na, 1+ edema in legs. Not wheezing Chest xray - chronic bronchitis changes, bibasilar atelectasis. Normal BNP (?falsely low 2/2 obesity) 5/7 - BLE edema to proximal thighs / anasarca.  Also recurrent wheezing.  O2 turned up to 5 L this AM. Suspect multifactorial due to COPD and ?CHF --Trial IV Lasix 20 mg BID today --Strict I/Os & daily weights --No echo available, will see what xray and BNP show first  Anasarca - BLE edema >> proximal thighs & truck --Diuresing as above --Check 2D Echo - pending  * COPD exacerbation - now stable, ?developing recurrence Patient  presenting with increased shortness of breath and cough over the last few days.   --90% on room air  --completed 5 days of prednisone 40 mg daily, and 3 days of Mucomyst neb BID. Plan: --Change DuoNebs back to scheduled TID + albuterol nebs PRN's --May need additional steroids, but will monitor response to diuresis first  Hyponatremia - mild.  Last Na 132.  On exam, mildly hypervolemic. --Encourage PO intake --Monitor BMP  Smoker --nicotine patch  Possible CAP --no fever, leukocytosis, procal neg.  CXR read as "patchy airspace disease at the left base suggesting pneumonia."  Personal review did not see obvious consolidation. --completed 2 days of ceftriaxone and azithromycin and 3 days of Levaquin for a 5-day course.  AKI (acute kidney injury) (HCC) --Cr 1.79 on presentation.  Baseline around 1.  Cr back to baseline the next day after IVF.  Schizophrenia (HCC) - stable - Continue home amitriptyline, and venlafaxine --cont home monthly Invega, last given 4/19   HTN (hypertension) - stable Patient has a history of hypertension currently on amlodipine and lisinopril.   Soft blood pressure noted by EMS with improvement with IV fluids. --cont home propranolol and amlodipine  Hypothyroidism --continue levothyroxine  BPH --Flomax, Toviaz  Insomnia --Trazodone   DVT prophylaxis: Lovenox SQ Code Status: Full code  Family Communication:  Level of care: Med-Surg  Dispo:   The patient is from: home alone   Anticipated d/c is to: unknown / to be determined.   Currently not medically ready - on IV diuresis, new O2 requirement   Unsafe discharge  home, unable to ambulate or care for self, no caregiver support.  Does not have Medicare and unwilling to hand over checks (would lose his apartment), so SNF is not an option.  Remains admitted until able to safely d/c home and/or lumbar surgical intervention (cannot be done until at least two weeks after epidural steroid  injection).   Subjective and Interval History:  Pt up in recliner when seen this AM.  He reports leg swelling up to his thighs and shortness of breath.  Reports feeling like he is wheezing again also.  Reports a lot of urine output after Lasix this AM.  He really wants his dog to be able to come visit here.  He misses his dog a lot.  No other acute complaints.     Objective: Vitals:   08/28/22 2218 08/28/22 2251 08/29/22 0000 08/29/22 0826  BP: 125/84  138/85 (!) 134/90  Pulse: 80  79 78  Resp:   18 16  Temp: 98.6 F (37 C)  98.4 F (36.9 C) 98.2 F (36.8 C)  TempSrc: Oral  Oral Oral  SpO2: 94% 95% 95% 94%  Weight:      Height:        Intake/Output Summary (Last 24 hours) at 08/29/2022 1423 Last data filed at 08/29/2022 1135 Gross per 24 hour  Intake 360 ml  Output 3450 ml  Net -3090 ml   Filed Weights   08/16/22 1130  Weight: 90.7 kg    Examination:   General exam: awake, alert, no acute distress HEENT: moist mucus membranes, hearing grossly normal  Respiratory system: on room air, normal respiratory effort, nasal cannula in place. Cardiovascular system: RRR, no pedal edema.   Gastrointestinal system: protuberant abdomen, +bowel sounds Central nervous system: A&Ox3, grossly non-focal, baseline abnormal speech Extremities: LLE in brace, no edema, normal tone Skin: dry, intact, normal temperature Psychiatry: normal mood and affect    Data Reviewed: I have personally reviewed labs and imaging studies No new labs today  BNP yesterday normal 47.3 Chest xray yesterday -- chronic bronchitis changes & bibasilar atelectasis  Last notable labs from 5/5 -- Na 132, glucose 127, BUN 24    Time spent: 42 minutes  Christopher Banter, DO [facility] If 7PM-7AM, please contact night-coverage 08/29/2022, 2:23 PM

## 2022-08-29 NOTE — Progress Notes (Signed)
Mobility Specialist - Progress Note   08/29/22 1031  Mobility  Activity Stood at bedside  Level of Assistance Standby assist, set-up cues, supervision of patient - no hands on  Assistive Device Front wheel walker  Activity Response Tolerated well  $Mobility charge 1 Mobility  Mobility Specialist Start Time (ACUTE ONLY) 1000  Mobility Specialist Stop Time (ACUTE ONLY) 1025  Mobility Specialist Time Calculation (min) (ACUTE ONLY) 25 min   Pt sitting in the recliner upon entry, utilizing RA. Pt agreeable to activity this date. MS dons KI, as PT is unable to. Pt STS X4 to RW SBA with ~1-2 min rest break in between-- heavy WB through BUE to maintain WBAT for RLE. Some SOB present, O2 remained above 90% throughout activity. Pt returned seated, MS doffs KI and Pt is left with alarm set and needs within reach.   Zetta Bills Mobility Specialist 08/29/22 10:38 AM

## 2022-08-29 NOTE — Progress Notes (Signed)
Occupational Therapy Treatment Patient Details Name: Christopher Mason MRN: [id number] DOB: 1961-01-02 Today's Date: 08/29/2022   History of present illness Pt is a 62 y.o. male with medical history significant of COPD, schizophrenia, previous spinal surgery, hypothyroidism, hypothyroidism, bipolar disorder, tardive dyskinesia, who presents to the ED with complaints of falls.  MD assessment includes: COPD exacerbation, CAP, AKI, and LLE weakness. MRI impression includes: multilevel lumbar spondylosis, worst at L3-4, where a left subarticular disc extrusion results in compression of the traversing left L4 nerve root in the left lateral recess.  Moderate left neural foraminal narrowing at this level, displacement of the traversing left L3 nerve root in the left lateral recess at L2-3, and severe bilateral neural foraminal narrowing at L5-S1.   OT comments  Mr Danesh was seen for OT treatment on this date. Upon arrival to room pt was reclined in bed, reports recently returned to bed from chair, agreeable to bed level tx. Good tolerance for therapeutic exercises as described below. SpO2 90% on 5L [place], requires rest breaks t/o. Goals remain appropriate, will continue to follow POC. Discharge recommendation remains appropriate.     Recommendations for follow up therapy are one component of a multi-disciplinary discharge planning process, led by the attending physician.  Recommendations may be updated based on patient status, additional functional criteria and insurance authorization.    Assistance Recommended at Discharge Frequent or constant Supervision/Assistance  Patient can return home with the following  A lot of help with bathing/dressing/bathroom;Assistance with cooking/housework;Assist for transportation;Help with stairs or ramp for entrance;Direct supervision/assist for medications management;A little help with walking and/or transfers   Equipment Recommendations  BSC/3in1    Recommendations for  Other Services      Precautions / Restrictions Precautions Precautions: Fall Restrictions Weight Bearing Restrictions: No Other Position/Activity Restrictions: OK to use LLE KI as needed to the LLE       Mobility Bed Mobility               General bed mobility comments: pt deferred citing recent activity, agreeable to bed level therex    Transfers                   General transfer comment: pt deferred         ADL either performed or assessed with clinical judgement   ADL Overall ADL's : Needs assistance/impaired                                       General ADL Comments: SETUP self-drinking bed level      Cognition Arousal/Alertness: Awake/alert Behavior During Therapy: WFL for tasks assessed/performed Overall Cognitive Status: Within Functional Limits for tasks assessed                                 General Comments: pleasant and cooperative        Exercises Exercises: General Upper Extremity, General Lower Extremity General Exercises - Upper Extremity Shoulder Flexion: AROM, Strengthening, Both, 10 reps, Supine Shoulder Extension: AROM, Strengthening, Both, 10 reps, Supine Elbow Flexion: AROM, Strengthening, Both, 10 reps, Supine Elbow Extension: AROM, Strengthening, Both, 10 reps, Supine Chair Push Up: AROM, Strengthening, Both General Exercises - Lower Extremity Ankle Circles/Pumps: AROM, Strengthening, Both, 10 reps, Supine Quad Sets: AROM, Strengthening, Both, 10 reps, Supine Gluteal Sets: AROM, Strengthening, Both, 10 reps, Supine Straight  Leg Raises: AROM, Strengthening, Both, 10 reps, Supine Other Exercises Other Exercises: educated on breathing exercises at bed in chair position       General Comments patient agreeable to in bed exercises for strengthening. encourage patient to perform LE exercises in bed as able    Pertinent Vitals/ Pain       Pain Assessment Pain Assessment: No/denies  pain   Frequency  Min 1X/week        Progress Toward Goals  OT Goals(current goals can now be found in the care plan section)  Progress towards OT goals: Progressing toward goals  Acute Rehab OT Goals Patient Stated Goal: to walk OT Goal Formulation: With patient Time For Goal Achievement: 08/30/22 Potential to Achieve Goals: Good ADL Goals Pt Will Perform Grooming: with modified independence;standing Pt Will Perform Lower Body Dressing: with modified independence;sit to/from stand Pt Will Transfer to Toilet: with modified independence;ambulating Pt Will Perform Toileting - Clothing Manipulation and hygiene: with modified independence;sit to/from stand  Plan Discharge plan remains appropriate;Frequency remains appropriate    Co-evaluation                 AM-PAC OT "6 Clicks" Daily Activity     Outcome Measure   Help from another person eating meals?: None Help from another person taking care of personal grooming?: A Little Help from another person toileting, which includes using toliet, bedpan, or urinal?: A Little Help from another person bathing (including washing, rinsing, drying)?: A Lot Help from another person to put on and taking off regular upper body clothing?: A Little Help from another person to put on and taking off regular lower body clothing?: A Lot 6 Click Score: 17    End of Session Equipment Utilized During Treatment: Oxygen  OT Visit Diagnosis: Unsteadiness on feet (R26.81);Muscle weakness (generalized) (M62.81);History of falling (Z91.81) Pain - Right/Left: Left Pain - part of body: Leg   Activity Tolerance Patient tolerated treatment well   Patient Left in bed;with call bell/phone within reach;with bed alarm set   Nurse Communication          Time: 5409-8119 OT Time Calculation (min): 19 min  Charges: OT General Charges $OT Visit: 1 Visit OT Treatments $Therapeutic Exercise: 8-22 mins  Kathie Dike, M.S. OTR/L  08/29/22,  3:31 PM  ascom [phone]

## 2022-08-29 NOTE — Plan of Care (Signed)
  Problem: Clinical Measurements: Goal: Ability to maintain clinical measurements within normal limits will improve Outcome: Progressing Goal: Will remain free from infection Outcome: Progressing Goal: Respiratory complications will improve Outcome: Progressing Goal: Cardiovascular complication will be avoided Outcome: Progressing   Problem: Activity: Goal: Risk for activity intolerance will decrease Outcome: Progressing   Problem: Pain Managment: Goal: General experience of comfort will improve Outcome: Progressing   Problem: Safety: Goal: Ability to remain free from injury will improve Outcome: Progressing   Problem: Skin Integrity: Goal: Risk for impaired skin integrity will decrease Outcome: Progressing   

## 2022-08-29 NOTE — TOC Progression Note (Signed)
Transition of Care [facility]) - Progression Note    Patient Details  Name: Christopher Mason MRN: [id number] Date of Birth: 1961/04/14  [facility] CM/SW Contact  Marlowe Sax, RN Phone Number: 08/29/2022, 10:10 AM  Clinical Narrative:   The patient continues to be followed by TOC, anticipate surgery in approx 1 week , lives at home alone, does not use Oxygen at home, Unable to go to STR due to unable to sign over monthly check      Barriers to Discharge: Continued Medical Work up  Expected Discharge Plan and Services   Discharge Planning Services: CM Consult                     DME Arranged: N/A DME Agency: NA                   Social Determinants of Health (SDOH) Interventions SDOH Screenings   Food Insecurity: No Food Insecurity (08/16/2022)  Housing: Low Risk  (08/16/2022)  Transportation Needs: No Transportation Needs (08/16/2022)  Utilities: Not At Risk (08/16/2022)  Alcohol Screen: Low Risk  (09/06/2021)  Depression (PHQ2-9): Low Risk  (09/06/2021)  Tobacco Use: High Risk (08/18/2022)    Readmission Risk Interventions     No data to display

## 2022-08-30 ENCOUNTER — Inpatient Hospital Stay: Payer: Medicaid Other

## 2022-08-30 DIAGNOSIS — J189 Pneumonia, unspecified organism: Secondary | ICD-10-CM | POA: Diagnosis not present

## 2022-08-30 DIAGNOSIS — J9601 Acute respiratory failure with hypoxia: Secondary | ICD-10-CM

## 2022-08-30 DIAGNOSIS — M5416 Radiculopathy, lumbar region: Secondary | ICD-10-CM

## 2022-08-30 DIAGNOSIS — R29898 Other symptoms and signs involving the musculoskeletal system: Secondary | ICD-10-CM | POA: Diagnosis not present

## 2022-08-30 DIAGNOSIS — E039 Hypothyroidism, unspecified: Secondary | ICD-10-CM

## 2022-08-30 DIAGNOSIS — F209 Schizophrenia, unspecified: Secondary | ICD-10-CM

## 2022-08-30 DIAGNOSIS — R601 Generalized edema: Secondary | ICD-10-CM

## 2022-08-30 DIAGNOSIS — J9611 Chronic respiratory failure with hypoxia: Secondary | ICD-10-CM | POA: Diagnosis present

## 2022-08-30 LAB — MAGNESIUM: Magnesium: 2 mg/dL (ref 1.7–2.4)

## 2022-08-30 LAB — ECHOCARDIOGRAM COMPLETE
Area-P 1/2: 3.59 cm2
S' Lateral: 3.2 cm
Weight: 3200 oz

## 2022-08-30 LAB — BASIC METABOLIC PANEL
Anion gap: 8 (ref 5–15)
BUN: 33 mg/dL — ABNORMAL HIGH (ref 8–23)
CO2: 29 mmol/L (ref 22–32)
Calcium: 9.1 mg/dL (ref 8.9–10.3)
Chloride: 99 mmol/L (ref 98–111)
Creatinine, Ser: 0.95 mg/dL (ref 0.61–1.24)
GFR, Estimated: >60 mL/min (ref >60)
Glucose, Bld: 111 mg/dL — ABNORMAL HIGH (ref 70–99)
Potassium: 4.3 mmol/L (ref 3.5–5.1)
Sodium: 136 mmol/L (ref 135–145)

## 2022-08-30 MED ORDER — BUDESONIDE 0.25 MG/2ML IN SUSP
0.2500 mg | Freq: Two times a day (BID) | RESPIRATORY_TRACT | Status: DC
  Administered 2022-08-30 – 2022-09-20 (×42): 0.25 mg via RESPIRATORY_TRACT
  Filled 2022-08-30 (×41): qty 2

## 2022-08-30 NOTE — Plan of Care (Signed)

## 2022-08-30 NOTE — Assessment & Plan Note (Addendum)
Patient with disc herniation L3/4.  Patient had steroid injection by interventional radiology on 4/26.  Patient has not improved at this point.  Physical therapy recommending rehab.  Thoracic spine x-ray negative.  Neurosurgery to consider procedure this week.

## 2022-08-30 NOTE — Assessment & Plan Note (Addendum)
Switch Lasix to oral.  Echocardiogram shows normal EF.

## 2022-08-30 NOTE — Hospital Course (Addendum)
62 y.o. male with medical history significant of COPD, schizophrenia, multiple previous spinal surgery, hypothyroidism, bipolar disorder, tardive dyskinesia, who presented to the ED with complaints of a fall.    He notes that his left leg weakness and numbness started as left hip pain a few days ago. He has had multiple falls due to the weakness. He denies any other focal weakness. He endorses increased cough over the last few days in addition to increased shortness of breath.  Patient had steroid injection on 4/26.  5/8.  Still having a lot of back pain.  Does not remember a steroid injection.  Physical therapy recommending rehab but patient does not want to sign over monthly check. 5/9.  Patient able to come down to 2 L of oxygen.  Still having weakness in his left leg. 5/10.  ABG showing a pO2 of 121. 5/11.  Patient complaining of left knee pain.  Will give heating pad. 5/12.  When I walked in the patient was on 6 L and I promptly dialed them down to 4.  Asked nursing staff and respiratory staff to dial down to 2 L.  ABG a few days ago showing a pO2 of 121 on 2 L.  I do not think he gets the best pulse ox readings on his finger secondary to clubbing and yellowish nails. 5/13.  Patient's abdomen is distended.  He stated he had a bowel movement yesterday.  Will get abdominal x-ray.

## 2022-08-30 NOTE — Assessment & Plan Note (Signed)
>>  ASSESSMENT AND PLAN FOR ACUTE RESPIRATORY FAILURE WITH HYPOXIA (HCC) WRITTEN ON 09/05/2022  5:14 PM BY Verla Glaze, MD  Patient desaturated in the 80s with working with occupational therapy.  I did get an ABG on 2 L and pO2 was 121.   This morning when I walked in he was on 3 L.  Pulse ox on the finger may not be the best on him secondary to clubbing and yellowish nails.  Please check pulse ox on his forehead or ear.  Continue nebulizer treatments.  Case discussed with patient's pulmonologist Dr. Viva Grise and will hold off on steroids prior to surgery.

## 2022-08-30 NOTE — Assessment & Plan Note (Addendum)
Increase dose of levothyroxine with TSH being elevated.

## 2022-08-30 NOTE — Progress Notes (Signed)
Progress Note   Patient: Christopher Mason [id number]:[id number] DOB: [birth year] DOA: 08/16/2022     13 DOS: the patient was seen and examined on 08/30/2022   Brief hospital course:  62 y.o. male with medical history significant of COPD, schizophrenia, multiple previous spinal surgery, hypothyroidism, bipolar disorder, tardive dyskinesia, who presented to the ED with complaints of a fall.    He notes that his left leg weakness and numbness started as left hip pain a few days ago. He has had multiple falls due to the weakness. He denies any other focal weakness. He endorses increased cough over the last few days in addition to increased shortness of breath.   5/8.  Still having a lot of back pain.  Does not remember a steroid injection.  Physical therapy recommending rehab but patient does not want to sign over monthly check.  Assessment and Plan: * Lumbar radiculopathy, acute Patient with disc herniation L3/4.  Patient had steroid injection by interventional radiology on 4/26.  Patient has not improved at this point.  Physical therapy recommending rehab.  Patient also having some mid back pain x-ray of the thoracic spine ordered.  Left leg weakness Secondary to disc herniation  Acute respiratory failure with hypoxia (HCC) Patient on 5 L of oxygen.  Does not wear oxygen at home.  CAP (community acquired pneumonia) Treated during hospital course  COPD exacerbation (HCC) Completed steroids.  Continue nebulizer treatments.  AKI (acute kidney injury) (HCC) Creatinine 1.79 on presentation and down to 0.95.  Anasarca Patient on Lasix twice daily.  Echocardiogram not read yet.  Schizophrenia (HCC) - Continue home amitriptyline, Invega and venlafaxine  Hypothyroidism On levothyroxine  HTN (hypertension) Blood pressure stable.  Continue propranolol and amlodipine        Subjective: Patient states that his legs feel weak and numb.  Needs help to move around.  Patient also having some  pain and is mid back.  Has had numerous prior operations on his lower back.  Physical Exam: Vitals:   08/30/22 0752 08/30/22 0752 08/30/22 1314 08/30/22 1558  BP:  130/80  120/76  Pulse:  71  78  Resp:  15  20  Temp:  98.2 F (36.8 C)  98 F (36.7 C)  TempSrc:      SpO2: 90% 90% 94% 92%  Weight:      Height:       Physical Exam HENT:     Head: Normocephalic.     Mouth/Throat:     Pharynx: No oropharyngeal exudate.  Eyes:     General: Lids are normal.     Conjunctiva/sclera: Conjunctivae normal.  Cardiovascular:     Rate and Rhythm: Normal rate and regular rhythm.     Heart sounds: Normal heart sounds, S1 normal and S2 normal.  Pulmonary:     Breath sounds: No decreased breath sounds, wheezing, rhonchi or rales.  Abdominal:     Palpations: Abdomen is soft.     Tenderness: There is no abdominal tenderness.  Musculoskeletal:     Right lower leg: No swelling.     Left lower leg: No swelling.  Skin:    General: Skin is warm.     Comments: Chronic lower extremity discoloration.  Neurological:     Mental Status: He is alert and oriented to person, place, and time.     Comments: Strength bilaterally good with pushing down and pulling up on bilateral ankles.  Unable to push out with his left leg while sitting on the chair.  Able to pull back with his left leg.  Able to lift both knees up off the chair.     Data Reviewed: Thoracic spine x-ray pending, creatinine 0.95   Disposition: Status is: Inpatient Remains inpatient appropriate because:   Planned Discharge Destination: To be determined    Time spent: 28 minutes  Author: Alford Highland, MD 08/30/2022 5:12 PM  For on call review [email or web address].

## 2022-08-30 NOTE — Assessment & Plan Note (Addendum)
Patient desaturated in the 80s with working with occupational therapy.  I did get an ABG on 2 L and pO2 was 121.   This morning when I walked in he was on 5 L of oxygen and promptly dialed down to 3 L.  Pulse ox on the finger may not be the best on him secondary to clubbing and yellowish nails.  Spoke with respiratory nursing staff about this yesterday.  Spoke with nursing staff today.  Please check pulse ox on his forehead or ear.  Will restart Solu-Medrol if patient goes to the operating room on Wednesday. Will consult patient pulmonologist Dr Jayme Cloud too see tomorrow.

## 2022-08-31 DIAGNOSIS — M5416 Radiculopathy, lumbar region: Secondary | ICD-10-CM | POA: Diagnosis not present

## 2022-08-31 DIAGNOSIS — R29898 Other symptoms and signs involving the musculoskeletal system: Secondary | ICD-10-CM | POA: Diagnosis not present

## 2022-08-31 DIAGNOSIS — J9601 Acute respiratory failure with hypoxia: Secondary | ICD-10-CM | POA: Diagnosis not present

## 2022-08-31 DIAGNOSIS — J189 Pneumonia, unspecified organism: Secondary | ICD-10-CM | POA: Diagnosis not present

## 2022-08-31 LAB — CBC
HCT: 34.5 % — ABNORMAL LOW (ref 39.0–52.0)
Hemoglobin: 10.5 g/dL — ABNORMAL LOW (ref 13.0–17.0)
MCH: 28.8 pg (ref 26.0–34.0)
MCHC: 30.4 g/dL (ref 30.0–36.0)
MCV: 94.8 fL (ref 80.0–100.0)
Platelets: 240 10*3/uL (ref 150–400)
RBC: 3.64 MIL/uL — ABNORMAL LOW (ref 4.22–5.81)
RDW: 15.4 % (ref 11.5–15.5)
WBC: 9.2 10*3/uL (ref 4.0–10.5)
nRBC: 0 % (ref 0.0–0.2)

## 2022-08-31 NOTE — Plan of Care (Signed)
Patient A&Ox4, from home, up with assist and walker in room, patient states he is very excited to get to see his dog on Saturday. Changed out supplemental humidified O2. Pain 6/10 after giving norco.

## 2022-08-31 NOTE — Plan of Care (Signed)
  Problem: Education: Goal: Knowledge of General Education information will improve Description: Including pain rating scale, medication(s)/side effects and non-pharmacologic comfort measures Outcome: Progressing   Problem: Health Behavior/Discharge Planning: Goal: Ability to manage health-related needs will improve Outcome: Progressing   Problem: Clinical Measurements: Goal: Will remain free from infection Outcome: Progressing Goal: Diagnostic test results will improve Outcome: Progressing   Problem: Activity: Goal: Risk for activity intolerance will decrease Outcome: Progressing   Problem: Nutrition: Goal: Adequate nutrition will be maintained Outcome: Progressing   Problem: Coping: Goal: Level of anxiety will decrease Outcome: Progressing   Problem: Pain Managment: Goal: General experience of comfort will improve Outcome: Progressing   Problem: Safety: Goal: Ability to remain free from injury will improve Outcome: Progressing   Problem: Skin Integrity: Goal: Risk for impaired skin integrity will decrease Outcome: Progressing   

## 2022-08-31 NOTE — Progress Notes (Signed)
Patient refused AM ABG

## 2022-08-31 NOTE — Progress Notes (Signed)
Progress Note   Patient: Christopher Mason ZOX:[id number] DOB: 11-22-1960 DOA: 08/16/2022     14 DOS: the patient was seen and examined on 08/31/2022   Brief hospital course:  62 y.o. male with medical history significant of COPD, schizophrenia, multiple previous spinal surgery, hypothyroidism, bipolar disorder, tardive dyskinesia, who presented to the ED with complaints of a fall.    He notes that his left leg weakness and numbness started as left hip pain a few days ago. He has had multiple falls due to the weakness. He denies any other focal weakness. He endorses increased cough over the last few days in addition to increased shortness of breath.   5/8.  Still having a lot of back pain.  Does not remember a steroid injection.  Physical therapy recommending rehab but patient does not want to sign over monthly check.  Assessment and Plan: * Lumbar radiculopathy, acute Patient with disc herniation L3/4.  Patient had steroid injection by interventional radiology on 4/26.  Patient has not improved at this point.  Physical therapy recommending rehab.  Thoracic spine x-ray negative.  Case discussed with neurosurgery and will consider surgical intervention early next week.  Needs to be 2 weeks after her steroid injection.  Left leg weakness Secondary to disc herniation  Acute respiratory failure with hypoxia (HCC) Patient on 3 L of oxygen this morning.  Looks like down to 2 L this afternoon.  Does not wear oxygen at home.  CAP (community acquired pneumonia) Treated during hospital course  COPD exacerbation (HCC) Completed steroids.  Continue nebulizer treatments.  AKI (acute kidney injury) (HCC) Creatinine 1.79 on presentation and down to 0.95.  Anasarca Patient on Lasix twice daily.  Echocardiogram shows normal EF.  Schizophrenia (HCC) - Continue home amitriptyline, Invega and venlafaxine  Hypothyroidism On levothyroxine  HTN (hypertension) Blood pressure stable.  Continue  propranolol and amlodipine        Subjective: Patient feels okay with regards to his breathing.  Down to 3 L of oxygen.  Still having left lower extremity weakness and back pain.  Physical Exam: Vitals:   08/30/22 2035 08/30/22 2130 08/30/22 2155 08/31/22 0915  BP:  136/75 136/75 122/83  Pulse:  81 81 79  Resp:  18  19  Temp:  98.1 F (36.7 C)  97.8 F (36.6 C)  TempSrc:  Oral  Oral  SpO2: 94% 91%  90%  Weight:      Height:       Physical Exam HENT:     Head: Normocephalic.     Mouth/Throat:     Pharynx: No oropharyngeal exudate.  Eyes:     General: Lids are normal.     Conjunctiva/sclera: Conjunctivae normal.  Cardiovascular:     Rate and Rhythm: Normal rate and regular rhythm.     Heart sounds: Normal heart sounds, S1 normal and S2 normal.  Pulmonary:     Breath sounds: No decreased breath sounds, wheezing, rhonchi or rales.  Abdominal:     Palpations: Abdomen is soft.     Tenderness: There is no abdominal tenderness.  Musculoskeletal:     Right lower leg: No swelling.     Left lower leg: No swelling.  Skin:    General: Skin is warm.     Comments: Chronic lower extremity discoloration.  Neurological:     Mental Status: He is alert and oriented to person, place, and time.     Comments: Able to flex and extend at the ankles.  Unable to straight  leg raise with left leg.  When I passively lift the left leg up off the bed and let go it drops down to the bed very quickly.     Data Reviewed: Creatinine 0.95, hemoglobin 10.5   Disposition: Status is: Inpatient Remains inpatient appropriate because: Neurosurgery to consider operation next week  Planned Discharge Destination: Skilled nursing facility    Time spent: 28 minutes  Author: Alford Highland, MD 08/31/2022 2:43 PM  For on call review [email or web address].

## 2022-08-31 NOTE — Plan of Care (Signed)
  Problem: Activity: Goal: Risk for activity intolerance will decrease Outcome: Progressing   Problem: Nutrition: Goal: Adequate nutrition will be maintained Outcome: Progressing   Problem: Pain Managment: Goal: General experience of comfort will improve Outcome: Progressing   Problem: Safety: Goal: Ability to remain free from injury will improve Outcome: Progressing   Problem: Skin Integrity: Goal: Risk for impaired skin integrity will decrease Outcome: Progressing   

## 2022-08-31 NOTE — Progress Notes (Signed)
Pt was requesting to see his dog, contacted floor supervisor for protocol on pets in the hospital. Written proof from veterinary office about the status of vaccine history is needed. Architectural technologist for [employer] vaccine record, emailed and printed and placed in chart, supervisor aware.

## 2022-09-01 DIAGNOSIS — J189 Pneumonia, unspecified organism: Secondary | ICD-10-CM | POA: Diagnosis not present

## 2022-09-01 DIAGNOSIS — R29898 Other symptoms and signs involving the musculoskeletal system: Secondary | ICD-10-CM | POA: Diagnosis not present

## 2022-09-01 DIAGNOSIS — M5416 Radiculopathy, lumbar region: Secondary | ICD-10-CM | POA: Diagnosis not present

## 2022-09-01 DIAGNOSIS — J9601 Acute respiratory failure with hypoxia: Secondary | ICD-10-CM | POA: Diagnosis not present

## 2022-09-01 LAB — BASIC METABOLIC PANEL
Anion gap: 7 (ref 5–15)
BUN: 30 mg/dL — ABNORMAL HIGH (ref 8–23)
CO2: 30 mmol/L (ref 22–32)
Calcium: 8.9 mg/dL (ref 8.9–10.3)
Chloride: 96 mmol/L — ABNORMAL LOW (ref 98–111)
Creatinine, Ser: 1.11 mg/dL (ref 0.61–1.24)
GFR, Estimated: >60 mL/min (ref >60)
Glucose, Bld: 118 mg/dL — ABNORMAL HIGH (ref 70–99)
Potassium: 4 mmol/L (ref 3.5–5.1)
Sodium: 133 mmol/L — ABNORMAL LOW (ref 135–145)

## 2022-09-01 LAB — MAGNESIUM: Magnesium: 2.2 mg/dL (ref 1.7–2.4)

## 2022-09-01 LAB — BLOOD GAS, ARTERIAL
Acid-Base Excess: 8.7 mmol/L — ABNORMAL HIGH (ref 0.0–2.0)
Bicarbonate: 32.8 mmol/L — ABNORMAL HIGH (ref 20.0–28.0)
O2 Content: 2 L/min
O2 Saturation: 99.1 %
Patient temperature: 37
pCO2 arterial: 42 mmHg (ref 32–48)
pH, Arterial: 7.5 — ABNORMAL HIGH (ref 7.35–7.45)
pO2, Arterial: 121 mmHg — ABNORMAL HIGH (ref 83–108)

## 2022-09-01 MED ORDER — ENOXAPARIN SODIUM 60 MG/0.6ML IJ SOSY
0.5000 mg/kg | PREFILLED_SYRINGE | INTRAMUSCULAR | Status: DC
  Administered 2022-09-01 – 2022-09-19 (×19): 55 mg via SUBCUTANEOUS
  Filled 2022-09-01 (×19): qty 0.6

## 2022-09-01 MED ORDER — FUROSEMIDE 10 MG/ML IJ SOLN
20.0000 mg | Freq: Every day | INTRAMUSCULAR | Status: DC
  Administered 2022-09-02 – 2022-09-03 (×2): 20 mg via INTRAVENOUS
  Filled 2022-09-01 (×2): qty 4

## 2022-09-01 NOTE — Plan of Care (Signed)
Patient A&Ox4, from home, up with walker in room. No significant changes this shift. Patient now on 1L O2 sat WNL

## 2022-09-01 NOTE — Progress Notes (Signed)
Occupational Therapy Treatment Patient Details Name: Christopher Mason MRN: [id number] DOB: 22-May-1960 Today's Date: 09/01/2022   History of present illness Pt is a 62 y.o. male with medical history significant of COPD, schizophrenia, previous spinal surgery, hypothyroidism, hypothyroidism, bipolar disorder, tardive dyskinesia, who presents to the ED with complaints of falls.  MD assessment includes: COPD exacerbation, CAP, AKI, and LLE weakness. MRI impression includes: multilevel lumbar spondylosis, worst at L3-4, where a left subarticular disc extrusion results in compression of the traversing left L4 nerve root in the left lateral recess.  Moderate left neural foraminal narrowing at this level, displacement of the traversing left L3 nerve root in the left lateral recess at L2-3, and severe bilateral neural foraminal narrowing at L5-S1.   OT comments  Pt seen for OT treatment on this date. Upon arrival to room pt in supine. Pt agreeable to tx. Pt requires MIN A to faciliate LE movement during supine to sit. Pt donned L KI and sock with MAX A seated EOB. Pt on 2L 02 via [place]. SpO2 85% at rest, 4L SpO2 rose to 88%. RN/MD notified. Pt making good progress toward goals, will continue to follow POC. Discharge recommendation remains appropriate.     Recommendations for follow up therapy are one component of a multi-disciplinary discharge planning process, led by the attending physician.  Recommendations may be updated based on patient status, additional functional criteria and insurance authorization.    Assistance Recommended at Discharge Frequent or constant Supervision/Assistance  Patient can return home with the following  A lot of help with bathing/dressing/bathroom;Assistance with cooking/housework;Assist for transportation;Help with stairs or ramp for entrance;Direct supervision/assist for medications management;A little help with walking and/or transfers   Equipment Recommendations  BSC/3in1     Recommendations for Other Services      Precautions / Restrictions Precautions Precautions: Fall Required Braces or Orthoses: Knee Immobilizer - Left Knee Immobilizer - Left: On when out of bed or walking Restrictions Weight Bearing Restrictions: No Other Position/Activity Restrictions: OK to use LLE KI as needed to the LLE       Mobility Bed Mobility Overal bed mobility: Needs Assistance Bed Mobility: Supine to Sit     Supine to sit: Min assist     General bed mobility comments: MIN A to faciliate LE movement during supine to sit.    Transfers Overall transfer level: Needs assistance Equipment used: Rolling walker (2 wheels) Transfers: Bed to chair/wheelchair/BSC Sit to Stand: From elevated surface, Min guard Stand pivot transfers: Min assist   Step pivot transfers: Min assist, +2 safety/equipment           Balance Overall balance assessment: Needs assistance, History of Falls Sitting-balance support: Feet supported Sitting balance-Leahy Scale: Good Sitting balance - Comments: pt demonstrated good sitting balance during LE dressing   Standing balance support: Bilateral upper extremity supported, During functional activity, Reliant on assistive device for balance Standing balance-Leahy Scale: Fair                             ADL either performed or assessed with clinical judgement   ADL Overall ADL's : Needs assistance/impaired                     Lower Body Dressing: Maximal assistance Lower Body Dressing Details (indicate cue type and reason): pt donned L KI and sock with MAX A seated EOB  Extremity/Trunk Assessment              Vision       Perception     Praxis      Cognition Arousal/Alertness: Awake/alert Behavior During Therapy: WFL for tasks assessed/performed Overall Cognitive Status: Within Functional Limits for tasks assessed                                 General  Comments: pleasant and cooperative        Exercises      Shoulder Instructions       General Comments pt on 2L 02 via [place]. SpO2 85% at rest, 4L SpO2 rose to 88%.    Pertinent Vitals/ Pain       Pain Assessment Pain Assessment: Faces Faces Pain Scale: Hurts little more Breathing: normal Pain Location: left upper thigh, low back Pain Descriptors / Indicators: Sore Pain Intervention(s): Repositioned  Home Living                                          Prior Functioning/Environment              Frequency  Min 1X/week        Progress Toward Goals  OT Goals(current goals can now be found in the care plan section)  Progress towards OT goals: Progressing toward goals  Acute Rehab OT Goals Patient Stated Goal: return to home OT Goal Formulation: With patient Time For Goal Achievement: 09/15/22 Potential to Achieve Goals: Good ADL Goals Pt Will Perform Grooming: with modified independence;standing Pt Will Perform Lower Body Dressing: with modified independence;sit to/from stand Pt Will Transfer to Toilet: with modified independence;ambulating Pt Will Perform Toileting - Clothing Manipulation and hygiene: with modified independence;sit to/from stand  Plan Discharge plan remains appropriate;Frequency remains appropriate    Co-evaluation          OT goals addressed during session: ADL's and self-care;Proper use of Adaptive equipment and DME      AM-PAC OT "6 Clicks" Daily Activity     Outcome Measure   Help from another person eating meals?: None Help from another person taking care of personal grooming?: A Little Help from another person toileting, which includes using toliet, bedpan, or urinal?: A Little Help from another person bathing (including washing, rinsing, drying)?: A Lot Help from another person to put on and taking off regular upper body clothing?: A Little Help from another person to put on and taking off regular lower body  clothing?: A Lot 6 Click Score: 17    End of Session Equipment Utilized During Treatment: Oxygen;Rolling walker (2 wheels);Left knee immobilizer  OT Visit Diagnosis: Unsteadiness on feet (R26.81);Muscle weakness (generalized) (M62.81);History of falling (Z91.81) Pain - Right/Left: Left Pain - part of body: Leg   Activity Tolerance Patient tolerated treatment well   Patient Left in chair;with call bell/phone within reach   Nurse Communication Other (comment) (SpO2 levels discussed with RN)        Time: 1610-9604 OT Time Calculation (min): 15 min  Charges: OT General Charges $OT Visit: 1 Visit OT Treatments $Self Care/Home Management : 8-22 mins  Thresa Ross, OTS

## 2022-09-01 NOTE — Progress Notes (Signed)
Physical Therapy Treatment Patient Details Name: Christopher Mason MRN: [id number] DOB: [birth year] Today's Date: 09/01/2022   History of Present Illness Pt is a 62 y.o. male with medical history significant of COPD, schizophrenia, previous spinal surgery, hypothyroidism, hypothyroidism, bipolar disorder, tardive dyskinesia, who presents to the ED with complaints of falls.  MD assessment includes: COPD exacerbation, CAP, AKI, and LLE weakness. MRI impression includes: multilevel lumbar spondylosis, worst at L3-4, where a left subarticular disc extrusion results in compression of the traversing left L4 nerve root in the left lateral recess.  Moderate left neural foraminal narrowing at this level, displacement of the traversing left L3 nerve root in the left lateral recess at L2-3, and severe bilateral neural foraminal narrowing at L5-S1.    PT Comments    Pt up to chair on arrival, 2L donned, KI Left donned. Pt c/o significant sleepiness most of day. SpO2: 85-87% on arrival- author makes flow rate adjustments, but improvements in sats are limtied even after rate increase to 6L/min. Pt vocalizes with nasally sounding, congested tones, when asked if he has difficulty with nasal inhalation, he says yes. I feel this may be limiting accuracy in true O2 delivery needs. Pt able to stand twice with minA. AMB today for first time in a week still limited to ~3-4 feet before winded and needs to sit. Will continue to follow.    Recommendations for follow up therapy are one component of a multi-disciplinary discharge planning process, led by the attending physician.  Recommendations may be updated based on patient status, additional functional criteria and insurance authorization.  Follow Up Recommendations  Can patient physically be transported by private vehicle: No    Assistance Recommended at Discharge Frequent or constant Supervision/Assistance  Patient can return home with the following A lot of help with  walking and/or transfers;A lot of help with bathing/dressing/bathroom;Assistance with cooking/housework;Direct supervision/assist for medications management;Assist for transportation;Help with stairs or ramp for entrance   Equipment Recommendations  Rolling walker (2 wheels);BSC/3in1;Wheelchair (measurements PT);Wheelchair cushion (measurements PT);Hospital bed    Recommendations for Other Services       Precautions / Restrictions Precautions Precautions: Fall Required Braces or Orthoses: Knee Immobilizer - Left Knee Immobilizer - Left: On when out of bed or walking Restrictions Weight Bearing Restrictions: No Other Position/Activity Restrictions: OK to use LLE KI as needed to the LLE     Mobility  Bed Mobility                    Transfers Overall transfer level: Needs assistance Equipment used: Rolling walker (2 wheels) Transfers: Sit to/from Stand Sit to Stand: Min assist                Ambulation/Gait Ambulation/Gait assistance: Min guard Gait Distance (Feet): 3 Feet Assistive device: Rolling walker (2 wheels) Gait Pattern/deviations: Step-to pattern       General Gait Details: winded after just 3 feet   Stairs             Wheelchair Mobility    Modified Rankin (Stroke Patients Only)       Balance                                            Cognition Arousal/Alertness: Awake/alert Behavior During Therapy: WFL for tasks assessed/performed Overall Cognitive Status: Within Functional Limits for tasks assessed  Exercises Other Exercises Other Exercises: STS from recliner, RW, minA Other Exercises: standing for pericare x60sec    General Comments General comments (skin integrity, edema, etc.): pt on 2L 02 via [place]. SpO2 85% at rest, 4L SpO2 rose to 88%.      Pertinent Vitals/Pain Pain Assessment Pain Assessment: 0-10 Pain Score: 6  Pain Location: left  upper thigh, low back Pain Intervention(s): Repositioned    Home Living                          Prior Function            PT Goals (current goals can now be found in the care plan section) Acute Rehab PT Goals Patient Stated Goal: To walk better PT Goal Formulation: With patient Time For Goal Achievement: 09/12/22 Potential to Achieve Goals: Good Progress towards PT goals: Progressing toward goals    Frequency    Min 3X/week      PT Plan Current plan remains appropriate    Co-evaluation       OT goals addressed during session: ADL's and self-care;Proper use of Adaptive equipment and DME      AM-PAC PT "6 Clicks" Mobility   Outcome Measure  Help needed turning from your back to your side while in a flat bed without using bedrails?: A Lot Help needed moving from lying on your back to sitting on the side of a flat bed without using bedrails?: A Lot Help needed moving to and from a bed to a chair (including a wheelchair)?: A Lot Help needed standing up from a chair using your arms (e.g., wheelchair or bedside chair)?: A Lot Help needed to walk in hospital room?: A Little Help needed climbing 3-5 steps with a railing? : A Lot 6 Click Score: 13    End of Session Equipment Utilized During Treatment: Oxygen Activity Tolerance: Patient limited by fatigue Patient left: with call bell/phone within reach;in chair;with chair alarm set Nurse Communication: Mobility status (wants to get back to bed after linen is changed) PT Visit Diagnosis: History of falling (Z91.81);Unsteadiness on feet (R26.81);Muscle weakness (generalized) (M62.81);Difficulty in walking, not elsewhere classified (R26.2);Pain;Other abnormalities of gait and mobility (R26.89) Pain - Right/Left: Left Pain - part of body: Leg     Time: 4098-1191 PT Time Calculation (min) (ACUTE ONLY): 23 min  Charges:  $Therapeutic Activity: 23-37 mins                    2:17 PM, 09/01/22 [name],  PT, DPT Physical Therapist - [facility]  [phone] (ASCOM)    [name] 09/01/2022, 2:14 PM

## 2022-09-01 NOTE — Progress Notes (Signed)
Mobility Specialist - Progress Note   09/01/22 1530  Mobility  Activity Ambulated with assistance in room;Transferred from chair to bed  Level of Assistance Standby assist, set-up cues, supervision of patient - no hands on  Assistive Device Front wheel walker  Distance Ambulated (ft) 2 ft  Activity Response Tolerated well  $Mobility charge 1 Mobility  Mobility Specialist Start Time (ACUTE ONLY) 1415  Mobility Specialist Stop Time (ACUTE ONLY) 1431  Mobility Specialist Time Calculation (min) (ACUTE ONLY) 16 min   Pt sitting in the recliner upon entry with KI donned, requesting to go to bed. Pt STS to RW MinA-- heavy WB through BUE, transferred to bed SBA. Pt left supine, noticeably SOB upon return supine with O2 >90%. Pt left KI doffed, alarm set and needs within reach.   Zetta Bills Mobility Specialist 09/01/22 3:38 PM

## 2022-09-01 NOTE — Progress Notes (Signed)
Progress Note   Patient: Christopher Mason OZH:[id number] DOB: 1960/11/14 DOA: 08/16/2022     15 DOS: the patient was seen and examined on 09/01/2022   Brief hospital course:  62 y.o. male with medical history significant of COPD, schizophrenia, multiple previous spinal surgery, hypothyroidism, bipolar disorder, tardive dyskinesia, who presented to the ED with complaints of a fall.    He notes that his left leg weakness and numbness started as left hip pain a few days ago. He has had multiple falls due to the weakness. He denies any other focal weakness. He endorses increased cough over the last few days in addition to increased shortness of breath.   5/8.  Still having a lot of back pain.  Does not remember a steroid injection.  Physical therapy recommending rehab but patient does not want to sign over monthly check. 5/9.  Patient able to come down to 2 L of oxygen.  Still having weakness in his left leg. 5/10.  ABG showing a pO2 of 121.  Assessment and Plan: * Lumbar radiculopathy, acute Patient with disc herniation L3/4.  Patient had steroid injection by interventional radiology on 4/26.  Patient has not improved at this point.  Physical therapy recommending rehab.  Thoracic spine x-ray negative.  Neurosurgery to consider procedure next week.  Needs to be 2 weeks after her steroid injection.  Left leg weakness Secondary to disc herniation  Acute respiratory failure with hypoxia (HCC) Patient desaturated in the 80s with working with occupational therapy.  I did get an ABG on 2 L and pO2 was 121 so we do have a potential of tapering down on the oxygen even further.  May not to have a great reading of pulse ox secondary to clubbing.  May end up needing to use the ear.  CAP (community acquired pneumonia) Treated during hospital course  COPD exacerbation (HCC) Completed steroids.  Continue nebulizer treatments.  AKI (acute kidney injury) (HCC) Creatinine 1.79 on presentation and down to  1.11.  Anasarca Patient on Lasix twice daily.  Echocardiogram shows normal EF.  Schizophrenia (HCC) - Continue home amitriptyline, Invega and venlafaxine  Hypothyroidism On levothyroxine  HTN (hypertension) Blood pressure stable.  Continue propranolol and amlodipine        Subjective: Patient's pulse ox was low and they increased him from 2 L to 4 L.  ABG showing PaO2 of 121 so we do have room to go down on the oxygen.  Likely not a great reading with a pulse ox on the finger secondary to clubbing.  Admitted with back pain and shortness of breath.  Physical Exam: Vitals:   09/01/22 0500 09/01/22 0816 09/01/22 1050 09/01/22 1100  BP:  125/88    Pulse:  77    Resp:  19    Temp:  98 F (36.7 C)    TempSrc:      SpO2:  (!) 88% 99% 99%  Weight: 109 kg     Height:       Physical Exam HENT:     Head: Normocephalic.     Mouth/Throat:     Pharynx: No oropharyngeal exudate.  Eyes:     General: Lids are normal.     Conjunctiva/sclera: Conjunctivae normal.  Cardiovascular:     Rate and Rhythm: Normal rate and regular rhythm.     Heart sounds: Normal heart sounds, S1 normal and S2 normal.  Pulmonary:     Breath sounds: No decreased breath sounds, wheezing, rhonchi or rales.  Abdominal:  Palpations: Abdomen is soft.     Tenderness: There is no abdominal tenderness.  Musculoskeletal:     Right lower leg: No swelling.     Left lower leg: No swelling.  Skin:    General: Skin is warm.     Comments: Chronic lower extremity discoloration.  Neurological:     Mental Status: He is alert and oriented to person, place, and time.     Comments: Able to flex and extend at the ankles.  Unable to straight leg raise with left leg.  When I passively lift the left leg up off the bed and let go it drops down to the bed very quickly.     Data Reviewed: ABG with a pO2 of 121 pulse ox of 99% Sodium 133, creatinine 1.11, hemoglobin 10.5  Disposition: Status is: Inpatient Remains  inpatient appropriate because: Potential back surgery next week (need to be at least 14 days from last steroid injection)  Planned Discharge Destination: Home    Time spent: 28 minutes  Author: Alford Highland, MD 09/01/2022 3:06 PM  For on call review [email or web address].

## 2022-09-02 DIAGNOSIS — K5909 Other constipation: Secondary | ICD-10-CM

## 2022-09-02 DIAGNOSIS — J9601 Acute respiratory failure with hypoxia: Secondary | ICD-10-CM | POA: Diagnosis not present

## 2022-09-02 DIAGNOSIS — E871 Hypo-osmolality and hyponatremia: Secondary | ICD-10-CM

## 2022-09-02 DIAGNOSIS — K5900 Constipation, unspecified: Secondary | ICD-10-CM | POA: Insufficient documentation

## 2022-09-02 DIAGNOSIS — R29898 Other symptoms and signs involving the musculoskeletal system: Secondary | ICD-10-CM | POA: Diagnosis not present

## 2022-09-02 DIAGNOSIS — M5416 Radiculopathy, lumbar region: Secondary | ICD-10-CM | POA: Diagnosis not present

## 2022-09-02 DIAGNOSIS — J189 Pneumonia, unspecified organism: Secondary | ICD-10-CM | POA: Diagnosis not present

## 2022-09-02 DIAGNOSIS — G8929 Other chronic pain: Secondary | ICD-10-CM

## 2022-09-02 DIAGNOSIS — M25562 Pain in left knee: Secondary | ICD-10-CM

## 2022-09-02 MED ORDER — LACTULOSE 10 GM/15ML PO SOLN
30.0000 g | Freq: Two times a day (BID) | ORAL | Status: DC
  Administered 2022-09-02 – 2022-09-03 (×2): 30 g via ORAL
  Filled 2022-09-02 (×3): qty 60

## 2022-09-02 NOTE — Assessment & Plan Note (Signed)
X-ray showing constipation. Continue MiraLAX.

## 2022-09-02 NOTE — Assessment & Plan Note (Signed)
Reviewed x-ray from earlier in the hospital course.  Will give a heating pad.

## 2022-09-02 NOTE — Assessment & Plan Note (Signed)
Sodium 2 points less than normal range.

## 2022-09-02 NOTE — Progress Notes (Signed)
Progress Note   Patient: Christopher Mason WRU:[id number] DOB: [birth year] DOA: 08/16/2022     16 DOS: the patient was seen and examined on 09/02/2022   Brief hospital course:  62 y.o. male with medical history significant of COPD, schizophrenia, multiple previous spinal surgery, hypothyroidism, bipolar disorder, tardive dyskinesia, who presented to the ED with complaints of a fall.    He notes that his left leg weakness and numbness started as left hip pain a few days ago. He has had multiple falls due to the weakness. He denies any other focal weakness. He endorses increased cough over the last few days in addition to increased shortness of breath.  Patient had steroid injection on 4/26.  5/8.  Still having a lot of back pain.  Does not remember a steroid injection.  Physical therapy recommending rehab but patient does not want to sign over monthly check. 5/9.  Patient able to come down to 2 L of oxygen.  Still having weakness in his left leg. 5/10.  ABG showing a pO2 of 121. 5/11.  Patient complaining of left knee pain.  Will give heating pad.   Assessment and Plan: * Lumbar radiculopathy, acute Patient with disc herniation L3/4.  Patient had steroid injection by interventional radiology on 4/26.  Patient has not improved at this point.  Physical therapy recommending rehab.  Thoracic spine x-ray negative.  Neurosurgery to consider procedure next week.  Needs to be 2 weeks after steroid injection.  Left leg weakness Secondary to disc herniation  Acute respiratory failure with hypoxia (HCC) Patient desaturated in the 80s with working with occupational therapy.  I did get an ABG on 2 L and pO2 was 121 so we do have a potential of tapering down on the oxygen even further.  May not to have a great reading of pulse ox secondary to clubbing.  May end up needing to use the ear.  CAP (community acquired pneumonia) Treated during hospital course  COPD exacerbation (HCC) Completed steroids.   Continue nebulizer treatments.  AKI (acute kidney injury) (HCC) Creatinine 1.79 on presentation and down to 1.11.  Constipation Added lactulose.  Continue MiraLAX  Anasarca Patient on Lasix.  Echocardiogram shows normal EF.  Chronic pain of left knee Reviewed x-ray from earlier in the hospital course.  Will give a heating pad.  Schizophrenia (HCC) - Continue home amitriptyline, Invega and venlafaxine  Hyponatremia Sodium 2 points less than normal range.  Hypothyroidism On levothyroxine  HTN (hypertension) Blood pressure stable.  Continue propranolol and amlodipine        Subjective: Patient feels that his breathing is okay.  Having some pain in his left knee.  Still with weakness on his left leg.  Physical Exam: Vitals:   09/01/22 2304 09/02/22 0703 09/02/22 0854 09/02/22 1350  BP: (!) 139/96  130/79   Pulse: 83  78   Resp: 18  16   Temp: 98.2 F (36.8 C)  98.1 F (36.7 C)   TempSrc:   Oral   SpO2: 96%  91% 94%  Weight:  110.6 kg    Height:       Physical Exam HENT:     Head: Normocephalic.     Mouth/Throat:     Pharynx: No oropharyngeal exudate.  Eyes:     General: Lids are normal.     Conjunctiva/sclera: Conjunctivae normal.  Cardiovascular:     Rate and Rhythm: Normal rate and regular rhythm.     Heart sounds: Normal heart sounds, S1 normal and  S2 normal.  Pulmonary:     Breath sounds: No decreased breath sounds, wheezing, rhonchi or rales.  Abdominal:     Palpations: Abdomen is soft.     Tenderness: There is no abdominal tenderness.  Musculoskeletal:     Left knee: Swelling present.     Right lower leg: No swelling.     Left lower leg: No swelling.  Skin:    General: Skin is warm.     Comments: Chronic lower extremity discoloration.  Neurological:     Mental Status: He is alert and oriented to person, place, and time.     Comments: Able to flex and extend at the ankles.  Unable to straight leg raise with left leg.  When I passively lift the  left leg up off the bed and let go it drops down to the bed very quickly.     Data Reviewed: ABG showing a pO2 of 121   Disposition: Status is: Inpatient Remains inpatient appropriate because: Potentially will have back surgery next week  Planned Discharge Destination: Home with Home Health    Time spent: 27 minutes  Author: Alford Highland, MD 09/02/2022 2:30 PM  For on call review [email or web address].

## 2022-09-03 DIAGNOSIS — J189 Pneumonia, unspecified organism: Secondary | ICD-10-CM | POA: Diagnosis not present

## 2022-09-03 DIAGNOSIS — M5416 Radiculopathy, lumbar region: Secondary | ICD-10-CM | POA: Diagnosis not present

## 2022-09-03 DIAGNOSIS — J9601 Acute respiratory failure with hypoxia: Secondary | ICD-10-CM | POA: Diagnosis not present

## 2022-09-03 DIAGNOSIS — R29898 Other symptoms and signs involving the musculoskeletal system: Secondary | ICD-10-CM | POA: Diagnosis not present

## 2022-09-03 LAB — BASIC METABOLIC PANEL
Anion gap: 5 (ref 5–15)
BUN: 27 mg/dL — ABNORMAL HIGH (ref 8–23)
CO2: 31 mmol/L (ref 22–32)
Calcium: 9.2 mg/dL (ref 8.9–10.3)
Chloride: 97 mmol/L — ABNORMAL LOW (ref 98–111)
Creatinine, Ser: 1.06 mg/dL (ref 0.61–1.24)
GFR, Estimated: >60 mL/min (ref >60)
Glucose, Bld: 110 mg/dL — ABNORMAL HIGH (ref 70–99)
Potassium: 4.7 mmol/L (ref 3.5–5.1)
Sodium: 133 mmol/L — ABNORMAL LOW (ref 135–145)

## 2022-09-03 MED ORDER — FUROSEMIDE 20 MG PO TABS
20.0000 mg | ORAL_TABLET | Freq: Two times a day (BID) | ORAL | Status: DC
  Administered 2022-09-03 – 2022-09-20 (×31): 20 mg via ORAL
  Filled 2022-09-03 (×32): qty 1

## 2022-09-03 MED ORDER — LACTULOSE 10 GM/15ML PO SOLN: 30.0000 g | Freq: Every day | ORAL | Status: DC | PRN

## 2022-09-03 NOTE — Progress Notes (Signed)
Progress Note   Patient: Christopher Mason NWG:[id number] DOB: 03-Apr-1961 DOA: 08/16/2022     17 DOS: the patient was seen and examined on 09/03/2022   Brief hospital course:  62 y.o. male with medical history significant of COPD, schizophrenia, multiple previous spinal surgery, hypothyroidism, bipolar disorder, tardive dyskinesia, who presented to the ED with complaints of a fall.    He notes that his left leg weakness and numbness started as left hip pain a few days ago. He has had multiple falls due to the weakness. He denies any other focal weakness. He endorses increased cough over the last few days in addition to increased shortness of breath.  Patient had steroid injection on 4/26.  5/8.  Still having a lot of back pain.  Does not remember a steroid injection.  Physical therapy recommending rehab but patient does not want to sign over monthly check. 5/9.  Patient able to come down to 2 L of oxygen.  Still having weakness in his left leg. 5/10.  ABG showing a pO2 of 121. 5/11.  Patient complaining of left knee pain.  Will give heating pad. 5/12.  When I walked in the patient was on 6 L and I promptly dialed them down to 4.  Asked nursing staff and respiratory staff to dial down to 2 L.  ABG a few days ago showing a pO2 of 121 on 2 L.  I do not think he gets the best pulse ox readings on his finger secondary to clubbing and yellowish nails.   Assessment and Plan: * Lumbar radiculopathy, acute Patient with disc herniation L3/4.  Patient had steroid injection by interventional radiology on 4/26.  Patient has not improved at this point.  Physical therapy recommending rehab.  Thoracic spine x-ray negative.  Neurosurgery to consider procedure this week.  Left leg weakness Secondary to disc herniation  Acute respiratory failure with hypoxia (HCC) Patient desaturated in the 80s with working with occupational therapy.  I did get an ABG on 2 L and pO2 was 121 so we do have a potential of  tapering down on the oxygen even further.  This morning when I walked in he was on 6 L of oxygen and promptly dialed down to 4 L.  Pulse ox on the finger may not be the best on him secondary to clubbing and yellowish nails.  Spoke with respiratory nursing staff about this.  Please check pulse ox on his forehead or ear.  CAP (community acquired pneumonia) Treated during hospital course  COPD exacerbation (HCC) Completed steroids.  Continue nebulizer treatments.  AKI (acute kidney injury) (HCC) Creatinine 1.79 on presentation and down to 1.06.  Constipation Had bowel movement.  Continue MiraLAX.  Make lactulose as needed.  Anasarca Switch Lasix to oral.  Echocardiogram shows normal EF.  Chronic pain of left knee Reviewed x-ray from earlier in the hospital course.  Will give a heating pad.  Schizophrenia (HCC) - Continue home amitriptyline, Invega and venlafaxine  Hyponatremia Sodium 2 points less than normal range.  Hypothyroidism On levothyroxine  HTN (hypertension) Blood pressure stable.  Continue propranolol and amlodipine        Subjective:   Physical Exam: Vitals:   09/03/22 0734 09/03/22 0747 09/03/22 0757 09/03/22 1349  BP:  130/85    Pulse:  80    Resp:      Temp:  98.5 F (36.9 C)    TempSrc:  Oral    SpO2: (!) 85% (!) 89% 91% 95%  Weight:  Height:       Physical Exam HENT:     Head: Normocephalic.     Mouth/Throat:     Pharynx: No oropharyngeal exudate.  Eyes:     General: Lids are normal.     Conjunctiva/sclera: Conjunctivae normal.  Cardiovascular:     Rate and Rhythm: Normal rate and regular rhythm.     Heart sounds: Normal heart sounds, S1 normal and S2 normal.  Pulmonary:     Breath sounds: No decreased breath sounds, wheezing, rhonchi or rales.  Abdominal:     Palpations: Abdomen is soft.     Tenderness: There is no abdominal tenderness.  Musculoskeletal:     Left knee: Swelling present.     Right lower leg: No swelling.      Left lower leg: No swelling.  Skin:    General: Skin is warm.     Comments: Chronic lower extremity discoloration.  Neurological:     Mental Status: He is alert and oriented to person, place, and time.     Comments: Able to flex and extend at the ankles.  Unable to straight leg raise with left leg.     Data Reviewed: Sodium 133, creatinine 1.06  Family Communication: Updated brother on the phone  Disposition: Status is: Inpatient Remains inpatient appropriate because: Neurosurgery to consider procedure this week  Planned Discharge Destination: Home with Home Health    Time spent: 27 minutes Case discussed with nursing staff and respiratory about checking a pulse ox on the forehead or ear.  Author: Alford Highland, MD 09/03/2022 1:54 PM  For on call review [email or web address].

## 2022-09-03 NOTE — Plan of Care (Signed)
Patient A&Ox4, from home, SBA with walker in room . Had BM today. No other significant changes this shift.

## 2022-09-03 NOTE — Plan of Care (Signed)

## 2022-09-04 ENCOUNTER — Inpatient Hospital Stay: Payer: Medicaid Other

## 2022-09-04 DIAGNOSIS — M5416 Radiculopathy, lumbar region: Secondary | ICD-10-CM | POA: Diagnosis not present

## 2022-09-04 DIAGNOSIS — J189 Pneumonia, unspecified organism: Secondary | ICD-10-CM | POA: Diagnosis not present

## 2022-09-04 DIAGNOSIS — J9601 Acute respiratory failure with hypoxia: Secondary | ICD-10-CM | POA: Diagnosis not present

## 2022-09-04 DIAGNOSIS — R29898 Other symptoms and signs involving the musculoskeletal system: Secondary | ICD-10-CM | POA: Diagnosis not present

## 2022-09-04 MED ORDER — AMITRIPTYLINE HCL 25 MG PO TABS
25.0000 mg | ORAL_TABLET | Freq: Every day | ORAL | Status: DC
  Administered 2022-09-04 – 2022-09-19 (×16): 25 mg via ORAL
  Filled 2022-09-04 (×16): qty 1

## 2022-09-04 MED ORDER — FLEET ENEMA 7-19 GM/118ML RE ENEM: 1.0000 | ENEMA | Freq: Once | RECTAL | Status: DC

## 2022-09-04 MED ORDER — BISACODYL 10 MG RE SUPP
10.0000 mg | Freq: Once | RECTAL | Status: AC
  Administered 2022-09-04: 10 mg via RECTAL
  Filled 2022-09-04: qty 1

## 2022-09-04 MED ORDER — LACTULOSE 10 GM/15ML PO SOLN
30.0000 g | Freq: Every day | ORAL | Status: DC
  Filled 2022-09-04: qty 60

## 2022-09-04 MED ORDER — ORAL CARE MOUTH RINSE: 15.0000 mL | OROMUCOSAL | Status: DC | PRN

## 2022-09-04 MED ORDER — MAGNESIUM CITRATE PO SOLN
1.0000 | Freq: Once | ORAL | Status: AC
  Administered 2022-09-08: 1 via ORAL
  Filled 2022-09-04: qty 296

## 2022-09-04 NOTE — TOC Progression Note (Signed)
Transition of Care [facility]) - Progression Note    Patient Details  Name: Christopher Mason MRN: [id number] Date of Birth: 01-Aug-1960  Transition of Care [facility]) CM/SW Contact  Marlowe Sax, RN Phone Number: 09/04/2022, 10:32 AM  Clinical Narrative:    TOC continues to follow the patient, he remains on Oxygen fluctuating from 2-6 liters, Neuro considering a procedure this week as his symptoms have not improved much Recommendation is rehab, however the patient is not able to sign over his monthly check due to that is his only way to pay bills and he would likely lose his home as a result He lives alone and has little support     Barriers to Discharge: Continued Medical Work up  Expected Discharge Plan and Services   Discharge Planning Services: CM Consult                     DME Arranged: N/A DME Agency: NA                   Social Determinants of Health (SDOH) Interventions SDOH Screenings   Food Insecurity: No Food Insecurity (08/16/2022)  Housing: Low Risk  (08/16/2022)  Transportation Needs: No Transportation Needs (08/16/2022)  Utilities: Not At Risk (08/16/2022)  Alcohol Screen: Low Risk  (09/06/2021)  Depression (PHQ2-9): Low Risk  (09/06/2021)  Tobacco Use: High Risk (08/18/2022)    Readmission Risk Interventions     No data to display

## 2022-09-04 NOTE — Progress Notes (Signed)
    Attending Progress Note  History: Christopher Mason is here for COPD exacerbation and LLE weakness.   09/04/2022: He continues to have weakness and pain and pain.  He has not been able to achieve enough mobility to be safely dischargeable. 08/22/2022: His strength is relatively stable.  He feels that his breathing is improved.  He had an epidural steroid injection on the 26th.  08/17/2022 [name] is in the hospital with a chief complaint of left leg weakness and difficulty with breathing.  He has previously had multiple spinal surgeries.  He has an extensive past medical history.  He has had multiple falls recently due to his left leg giving out.  He also describes pain down his left leg.  The pain is difficult to describe but appears to go into his left thigh.  He does not well describe pain below his left knee, though he does have some numbness around his ankle.   Over the past few days, he has had a cough as well as increased shortness of breath.  He recently quit smoking in the past week. Physical Exam: Vitals:   09/04/22 0746 09/04/22 1614  BP: 135/86 105/73  Pulse: 87 72  Resp: 20 20  Temp: 98.7 F (37.1 C) 98.2 F (36.8 C)  SpO2: (!) 88% (!) 89%    AA Ox3 CNI Strength: Side Biceps Triceps Deltoid Interossei Grip Wrist Ext. Wrist Flex.  R 5 5 5 5 5 5 5   L 5 5 5 5 5 5 5     Side Iliopsoas Quads Hamstring PF DF EHL  R 5 5 5 5 5 5   L 4 3 5 5 5 5      Data:  Other tests/results: n/a  Assessment/Plan:  Christopher Mason is relatively stable with left leg weakness that causes his left quadriceps to buckle when he walks.  He had an epidural steroid injection on August 18, 2022.  At this point, he is not making significant improvements.  I have reviewed the clinical condition in detail with the patient, we discussed moving forward with microdiscectomy.  I think it is reasonable to move forward provide at that he is cleared medically.  Will plan for left-sided L3-4  microdiscectomy.  He is aware of his increased risk of infection and accepts this risk.  I discussed the planned procedure at length with the patient, including the risks, benefits, alternatives, and indications. The risks discussed include but are not limited to bleeding, infection, need for reoperation, spinal fluid leak, stroke, vision loss, anesthetic complication, coma, paralysis, and even death. I also described in detail that improvement was not guaranteed.  The patient expressed understanding of these risks, and asked that we proceed with surgery. I described the surgery in layman's terms, and gave ample opportunity for questions, which were answered to the best of my ability.  Venetia Night MD, [facility] Department of Neurosurgery

## 2022-09-04 NOTE — Progress Notes (Addendum)
Mobility Specialist - Progress Note  During mobility: SpO2 88-87%   09/04/22 0947  Mobility  Activity Stood at bedside;Transferred from bed to chair  Level of Assistance Standby assist, set-up cues, supervision of patient - no hands on  Assistive Device Front wheel walker  Distance Ambulated (ft) 2 ft  Activity Response Tolerated well  $Mobility charge 1 Mobility  Mobility Specialist Start Time (ACUTE ONLY) [id number]  Mobility Specialist Stop Time (ACUTE ONLY) 0945  Mobility Specialist Time Calculation (min) (ACUTE ONLY) 20 min   Pt supine upon entry, utilizing [place]. Pt requesting to "get out of bed", opting to sit in the recliner. MS donned KI, Pt completed bed mob indep, STS to RW MinA and transferred to the recliner MinG-SBA. Pt O2 desat to 87% during the transfer, however once seated and pursed lip breathing is preformed Pt O2 rose to 88%. Pt left seated in the recliner with KI doffed, alarm set and needs within reach.

## 2022-09-04 NOTE — Progress Notes (Addendum)
Progress Note   Patient: Christopher Mason ZOX:[id number] DOB: [birth year] DOA: 08/16/2022     18 DOS: the patient was seen and examined on 09/04/2022   Brief hospital course:  62 y.o. male with medical history significant of COPD, schizophrenia, multiple previous spinal surgery, hypothyroidism, bipolar disorder, tardive dyskinesia, who presented to the ED with complaints of a fall.    He notes that his left leg weakness and numbness started as left hip pain a few days ago. He has had multiple falls due to the weakness. He denies any other focal weakness. He endorses increased cough over the last few days in addition to increased shortness of breath.  Patient had steroid injection on 4/26.  5/8.  Still having a lot of back pain.  Does not remember a steroid injection.  Physical therapy recommending rehab but patient does not want to sign over monthly check. 5/9.  Patient able to come down to 2 L of oxygen.  Still having weakness in his left leg. 5/10.  ABG showing a pO2 of 121. 5/11.  Patient complaining of left knee pain.  Will give heating pad. 5/12.  When I walked in the patient was on 6 L and I promptly dialed them down to 4.  Asked nursing staff and respiratory staff to dial down to 2 L.  ABG a few days ago showing a pO2 of 121 on 2 L.  I do not think he gets the best pulse ox readings on his finger secondary to clubbing and yellowish nails. 5/13.  Patient's abdomen is distended.  He stated he had a bowel movement yesterday.  Will get abdominal x-ray.   Assessment and Plan: * Lumbar radiculopathy, acute Patient with disc herniation L3/4.  Patient had steroid injection by interventional radiology on 4/26.  Patient has not improved at this point.  Physical therapy recommending rehab.  Thoracic spine x-ray negative.  Neurosurgery to consider procedure on Wednesday.  Low risk stress test in January 2024.  Left leg weakness Secondary to disc herniation  Acute respiratory failure with hypoxia  (HCC) Patient desaturated in the 80s with working with occupational therapy.  I did get an ABG on 2 L and pO2 was 121.   This morning when I walked in he was on 5 L of oxygen and promptly dialed down to 3 L.  Pulse ox on the finger may not be the best on him secondary to clubbing and yellowish nails.  Spoke with respiratory nursing staff about this yesterday.  Spoke with nursing staff today.  Please check pulse ox on his forehead or ear.  Will restart Solu-Medrol if patient goes to the operating room on Wednesday. Will consult patient pulmonologist Dr Jayme Cloud too see tomorrow.  CAP (community acquired pneumonia) Treated during hospital course  COPD exacerbation (HCC) Completed steroids.  Continue nebulizer treatments.  Will restart steroids if patient goes to the operating room on Wednesday.  AKI (acute kidney injury) (HCC) Creatinine 1.79 on presentation and down to 1.06.  Constipation With abdominal distention will get abdominal x-ray.  Had bowel movement.  Continue MiraLAX.  Continue lactulose.  Anasarca Switch Lasix to oral.  Echocardiogram shows normal EF.  Fatigue D/c toviaz and decrease dose of elavil.  Check a TSH.  Advised that the pain medications can also cause him to feel tired.  Chronic pain of left knee Reviewed x-ray from earlier in the hospital course.  Will give a heating pad.  Schizophrenia (HCC) - Continue home amitriptyline, Invega and venlafaxine  Hyponatremia  Sodium 2 points less than normal range.  Hypothyroidism On levothyroxine  HTN (hypertension) Blood pressure stable.  Continue propranolol and amlodipine        Subjective: Patient again complains of fatigue.  States his breathing is okay.  Has some abdominal distention.  States he had a bowel movement yesterday.  Complains of left leg weakness and low back pain.  Physical Exam: Vitals:   09/03/22 2006 09/03/22 2320 09/04/22 0618 09/04/22 0746  BP:  126/88  135/86  Pulse:  81  87  Resp:  18   20  Temp:  98.2 F (36.8 C)  98.7 F (37.1 C)  TempSrc:      SpO2: 90% (!) 87%  (!) 88%  Weight:   111.3 kg   Height:       Physical Exam HENT:     Head: Normocephalic.     Mouth/Throat:     Pharynx: No oropharyngeal exudate.  Eyes:     General: Lids are normal.     Conjunctiva/sclera: Conjunctivae normal.  Cardiovascular:     Rate and Rhythm: Normal rate and regular rhythm.     Heart sounds: Normal heart sounds, S1 normal and S2 normal.  Pulmonary:     Breath sounds: No decreased breath sounds, wheezing, rhonchi or rales.  Abdominal:     General: There is distension.     Palpations: Abdomen is soft.     Tenderness: There is no abdominal tenderness.  Musculoskeletal:     Left knee: Swelling present.     Right lower leg: No swelling.     Left lower leg: No swelling.  Skin:    General: Skin is warm.     Comments: Chronic lower extremity discoloration.  Neurological:     Mental Status: He is alert and oriented to person, place, and time.     Comments: Able to flex and extend at the ankles.  Unable to straight leg raise with left leg.     Data Reviewed: Last sodium 133, last creatinine 1.06  Family Communication: Updated brother yesterday  Disposition: Status is: Inpatient Remains inpatient appropriate because: Neurosurgery to evaluate to potentially set up for surgery on Wednesday.  Planned Discharge Destination: Home with Home Health    Time spent: 27 minutes  Author: Alford Highland, MD 09/04/2022 3:13 PM  For on call review [email or web address].

## 2022-09-04 NOTE — Assessment & Plan Note (Addendum)
Increase levothyroxine with TSH being elevated above 14.

## 2022-09-04 NOTE — Progress Notes (Signed)
Physical Therapy Treatment Patient Details Name: Christopher Mason MRN: [id number] DOB: 1960-10-15 Today's Date: 09/04/2022   History of Present Illness Pt is a 62 y.o. male with medical history significant of COPD, schizophrenia, previous spinal surgery, hypothyroidism, hypothyroidism, bipolar disorder, tardive dyskinesia, who presents to the ED with complaints of falls.  MD assessment includes: COPD exacerbation, CAP, AKI, and LLE weakness. MRI impression includes: multilevel lumbar spondylosis, worst at L3-4, where a left subarticular disc extrusion results in compression of the traversing left L4 nerve root in the left lateral recess.  Moderate left neural foraminal narrowing at this level, displacement of the traversing left L3 nerve root in the left lateral recess at L2-3, and severe bilateral neural foraminal narrowing at L5-S1.    PT Comments    Pt seen for PT tx with pt agreeable. Session focused on LLE strengthening exercises with pt requiring AAROM PRN & cuing for technique. Pt demonstrates significant strength deficits in LLE. Pt reports sx is planned for Wednesday. Pt also notes he has hx of polio & wears metal brace on LLE (has worn brace for ~3 years) but new weakness is much worse than baseline. Pt on 3L/min via nasal cannula throughout session with SpO2 >/= 91%. Pt notes he's "very tired" after seated exercises.    Recommendations for follow up therapy are one component of a multi-disciplinary discharge planning process, led by the attending physician.  Recommendations may be updated based on patient status, additional functional criteria and insurance authorization.  Follow Up Recommendations  Can patient physically be transported by private vehicle: No    Assistance Recommended at Discharge Frequent or constant Supervision/Assistance  Patient can return home with the following A lot of help with walking and/or transfers;A lot of help with bathing/dressing/bathroom;Assistance  with cooking/housework;Direct supervision/assist for medications management;Assist for transportation;Help with stairs or ramp for entrance   Equipment Recommendations  Rolling walker (2 wheels);BSC/3in1;Wheelchair (measurements PT);Wheelchair cushion (measurements PT);Hospital bed    Recommendations for Other Services       Precautions / Restrictions Precautions Precautions: Fall Required Braces or Orthoses: Knee Immobilizer - Left Knee Immobilizer - Left: On when out of bed or walking Restrictions Weight Bearing Restrictions: No Other Position/Activity Restrictions: OK to use LLE KI as needed to the LLE     Mobility  Bed Mobility                    Transfers                        Ambulation/Gait                   Stairs             Wheelchair Mobility    Modified Rankin (Stroke Patients Only)       Balance                                            Cognition Arousal/Alertness: Awake/alert Behavior During Therapy: WFL for tasks assessed/performed Overall Cognitive Status: Within Functional Limits for tasks assessed                                          Exercises General Exercises - Lower Extremity Long Arc Quad:  AAROM, Seated, Strengthening, Left, 20 reps (2 sets x 10 reps) Heel Slides: Strengthening, Left, 20 reps, AAROM (2 sets x 10 reps) Hip ABduction/ADduction: AROM, Strengthening, Left, 20 reps (2 sets x 10 reps) Straight Leg Raises: AAROM, Strengthening, Left, 10 reps    General Comments        Pertinent Vitals/Pain Pain Assessment Pain Assessment: No/denies pain    Home Living                          Prior Function            PT Goals (current goals can now be found in the care plan section) Acute Rehab PT Goals Patient Stated Goal: To walk better PT Goal Formulation: With patient Time For Goal Achievement: 09/12/22 Potential to Achieve Goals:  Fair Progress towards PT goals: Progressing toward goals (PT goals reviewed & date updated)    Frequency    Min 3X/week      PT Plan Current plan remains appropriate    Co-evaluation              AM-PAC PT "6 Clicks" Mobility   Outcome Measure  Help needed turning from your back to your side while in a flat bed without using bedrails?: A Lot Help needed moving from lying on your back to sitting on the side of a flat bed without using bedrails?: A Lot Help needed moving to and from a bed to a chair (including a wheelchair)?: A Lot Help needed standing up from a chair using your arms (e.g., wheelchair or bedside chair)?: A Lot Help needed to walk in hospital room?: A Little Help needed climbing 3-5 steps with a railing? : A Lot 6 Click Score: 13    End of Session Equipment Utilized During Treatment: Oxygen Activity Tolerance: Patient limited by fatigue Patient left: in chair;with chair alarm set;with call bell/phone within reach Nurse Communication: Mobility status PT Visit Diagnosis: Muscle weakness (generalized) (M62.81);Difficulty in walking, not elsewhere classified (R26.2)     Time: 1610-9604 PT Time Calculation (min) (ACUTE ONLY): 13 min  Charges:  $Therapeutic Exercise: 8-22 mins                     Aleda Grana, PT, DPT 09/04/22, 2:35 PM   Sandi Mariscal 09/04/2022, 2:34 PM

## 2022-09-05 ENCOUNTER — Other Ambulatory Visit: Payer: Self-pay | Admitting: Nurse Practitioner

## 2022-09-05 DIAGNOSIS — R29898 Other symptoms and signs involving the musculoskeletal system: Secondary | ICD-10-CM | POA: Diagnosis not present

## 2022-09-05 DIAGNOSIS — J9811 Atelectasis: Secondary | ICD-10-CM | POA: Diagnosis not present

## 2022-09-05 DIAGNOSIS — J449 Chronic obstructive pulmonary disease, unspecified: Secondary | ICD-10-CM

## 2022-09-05 DIAGNOSIS — M5416 Radiculopathy, lumbar region: Secondary | ICD-10-CM | POA: Diagnosis not present

## 2022-09-05 DIAGNOSIS — J189 Pneumonia, unspecified organism: Secondary | ICD-10-CM | POA: Diagnosis not present

## 2022-09-05 DIAGNOSIS — J9601 Acute respiratory failure with hypoxia: Secondary | ICD-10-CM | POA: Diagnosis not present

## 2022-09-05 DIAGNOSIS — E669 Obesity, unspecified: Secondary | ICD-10-CM | POA: Insufficient documentation

## 2022-09-05 LAB — BASIC METABOLIC PANEL
Anion gap: 6 (ref 5–15)
BUN: 27 mg/dL — ABNORMAL HIGH (ref 8–23)
CO2: 30 mmol/L (ref 22–32)
Calcium: 8.9 mg/dL (ref 8.9–10.3)
Chloride: 98 mmol/L (ref 98–111)
Creatinine, Ser: 0.91 mg/dL (ref 0.61–1.24)
GFR, Estimated: >60 mL/min (ref >60)
Glucose, Bld: 116 mg/dL — ABNORMAL HIGH (ref 70–99)
Potassium: 4 mmol/L (ref 3.5–5.1)
Sodium: 134 mmol/L — ABNORMAL LOW (ref 135–145)

## 2022-09-05 LAB — TSH: TSH: 14.953 u[IU]/mL — ABNORMAL HIGH (ref 0.350–4.500)

## 2022-09-05 LAB — CBC
HCT: 33.9 % — ABNORMAL LOW (ref 39.0–52.0)
Hemoglobin: 10.4 g/dL — ABNORMAL LOW (ref 13.0–17.0)
MCH: 29.1 pg (ref 26.0–34.0)
MCHC: 30.7 g/dL (ref 30.0–36.0)
MCV: 94.7 fL (ref 80.0–100.0)
Platelets: 237 10*3/uL (ref 150–400)
RBC: 3.58 MIL/uL — ABNORMAL LOW (ref 4.22–5.81)
RDW: 15.5 % (ref 11.5–15.5)
WBC: 7.2 10*3/uL (ref 4.0–10.5)
nRBC: 0 % (ref 0.0–0.2)

## 2022-09-05 LAB — PROTIME-INR
INR: 1 (ref 0.8–1.2)
Prothrombin Time: 13.5 seconds (ref 11.4–15.2)

## 2022-09-05 LAB — APTT: aPTT: 35 seconds (ref 24–36)

## 2022-09-05 MED ORDER — METHYLPREDNISOLONE SODIUM SUCC 125 MG IJ SOLR: 120.0000 mg | INTRAMUSCULAR | Status: DC

## 2022-09-05 MED ORDER — LEVOTHYROXINE SODIUM 50 MCG PO TABS
150.0000 ug | ORAL_TABLET | Freq: Every day | ORAL | Status: DC
  Administered 2022-09-07 – 2022-09-20 (×14): 150 ug via ORAL
  Filled 2022-09-05 (×14): qty 3

## 2022-09-05 NOTE — TOC Progression Note (Signed)
Transition of Care [facility]) - Progression Note    Patient Details  Name: Christopher Mason MRN: [id number] Date of Birth: 1960-06-04  [facility] of [facility]) CM/SW Contact  Marlowe Sax, RN Phone Number: 09/05/2022, 12:15 PM  Clinical Narrative:   TOC continues to follow the patient and will assist with DC planning, not medically ready yet       Barriers to Discharge: Continued Medical Work up  Expected Discharge Plan and Services   Discharge Planning Services: CM Consult                     DME Arranged: N/A DME Agency: NA                   Social Determinants of Health (SDOH) Interventions SDOH Screenings   Food Insecurity: No Food Insecurity (08/16/2022)  Housing: Low Risk  (08/16/2022)  Transportation Needs: No Transportation Needs (08/16/2022)  Utilities: Not At Risk (08/16/2022)  Alcohol Screen: Low Risk  (09/06/2021)  Depression (PHQ2-9): Low Risk  (09/06/2021)  Tobacco Use: High Risk (08/18/2022)    Readmission Risk Interventions     No data to display

## 2022-09-05 NOTE — Progress Notes (Addendum)
Progress Note   Patient: Christopher Mason WUJ:[id number] DOB: [birth year] DOA: 08/16/2022     19 DOS: the patient was seen and examined on 09/05/2022   Brief hospital course:  62 y.o. male with medical history significant of COPD, schizophrenia, multiple previous spinal surgery, hypothyroidism, bipolar disorder, tardive dyskinesia, who presented to the ED with complaints of a fall.    He notes that his left leg weakness and numbness started as left hip pain a few days ago. He has had multiple falls due to the weakness. He denies any other focal weakness. He endorses increased cough over the last few days in addition to increased shortness of breath.  Patient had steroid injection on 4/26.  5/8.  Still having a lot of back pain.  Does not remember a steroid injection.  Physical therapy recommending rehab but patient does not want to sign over monthly check. 5/9.  Patient able to come down to 2 L of oxygen.  Still having weakness in his left leg. 5/10.  ABG showing a pO2 of 121. 5/11.  Patient complaining of left knee pain.  Will give heating pad. 5/12.  When I walked in the patient was on 6 L and I promptly dialed them down to 4.  Asked nursing staff and respiratory staff to dial down to 2 L.  ABG a few days ago showing a pO2 of 121 on 2 L.  I do not think he gets the best pulse ox readings on his finger secondary to clubbing and yellowish nails. 5/13.  Patient's abdomen is distended.  He stated he had a bowel movement yesterday.  Abdominal x-ray showing large amount of stool. 5/14.  Neurosurgery planning on microdiscectomy tomorrow.  Case discussed with pulmonary and will hold off on steroids prior to surgery   Assessment and Plan: * Lumbar radiculopathy, acute Patient with disc herniation L3/4.  Patient had steroid injection by interventional radiology on 4/26.  Patient has not improved at this point.  Physical therapy recommending rehab.  Thoracic spine x-ray negative.  Low risk stress test  in January 2024.  Neurosurgery planning on microdiscectomy on 5/15.  Left leg weakness Secondary to disc herniation  Acute respiratory failure with hypoxia (HCC) Patient desaturated in the 80s with working with occupational therapy.  I did get an ABG on 2 L and pO2 was 121.   This morning when I walked in he was on 3 L.  Pulse ox on the finger may not be the best on him secondary to clubbing and yellowish nails.  Please check pulse ox on his forehead or ear.  Continue nebulizer treatments.  Case discussed with patient's pulmonologist Dr. Jayme Cloud and will hold off on steroids prior to surgery.  CAP (community acquired pneumonia) Treated during hospital course  COPD exacerbation (HCC) Completed steroids.  Continue nebulizer treatments.  Will restart steroids tomorrow morning.  AKI (acute kidney injury) (HCC) Creatinine 1.79 on presentation and down to 0.91.  Obesity (BMI 30-39.9) BMI 38.78  Constipation X-ray showing constipation. Continue MiraLAX.   Anasarca Switch Lasix to oral.  Echocardiogram shows normal EF.  Fatigue Increase levothyroxine with TSH being elevated above 14.  Chronic pain of left knee Reviewed x-ray from earlier in the hospital course.  Will give a heating pad.  Schizophrenia (HCC) - Continue home amitriptyline, Invega and venlafaxine  Hyponatremia Sodium 1 points less than normal range.  Hypothyroidism Increase dose of levothyroxine with TSH being elevated.  HTN (hypertension) Blood pressure stable.  Continue propranolol and amlodipine  Subjective: Patient feeling sleepy.  TSH high and so I increased his levothyroxine.  Decrease his Elavil and got rid of Toviaz.  Neurosurgery planning on microdiscectomy tomorrow morning.  Will start stress dose steroids tomorrow a.m.  Physical Exam: Vitals:   09/05/22 0728 09/05/22 0805 09/05/22 0820 09/05/22 1054  BP:  123/65    Pulse:  76  80  Resp:  16 18 20   Temp:  (!) 97.4 F (36.3 C)     TempSrc:  Oral    SpO2: 95% (!) 89% 92% 93%  Weight:      Height:       Physical Exam HENT:     Head: Normocephalic.     Mouth/Throat:     Pharynx: No oropharyngeal exudate.  Eyes:     General: Lids are normal.     Conjunctiva/sclera: Conjunctivae normal.  Cardiovascular:     Rate and Rhythm: Normal rate and regular rhythm.     Heart sounds: Normal heart sounds, S1 normal and S2 normal.  Pulmonary:     Breath sounds: Examination of the right-lower field reveals decreased breath sounds. Examination of the left-lower field reveals decreased breath sounds. Decreased breath sounds present. No wheezing, rhonchi or rales.  Abdominal:     General: There is distension.     Palpations: Abdomen is soft.     Tenderness: There is no abdominal tenderness.  Musculoskeletal:     Left knee: Swelling present.     Right lower leg: No swelling.     Left lower leg: No swelling.  Skin:    General: Skin is warm.     Comments: Chronic lower extremity discoloration.  Neurological:     Mental Status: He is alert and oriented to person, place, and time.     Comments: Able to flex and extend at the ankles.  Unable to straight leg raise with left leg.     Data Reviewed: Sodium 134, white blood cell count 7.2, hemoglobin 10.4, INR 1.0, TSH 14.953  Family Communication: Left message for brother Onalee Hua  Disposition: Status is: Inpatient Remains inpatient appropriate because: Neurosurgery planning on microdiscectomy tomorrow  Planned Discharge Destination: Home with home health    Time spent: 28 minutes Case discussed with patient's pulmonologist Dr. Sarina Ser.  Author: Alford Highland, MD 09/05/2022 5:14 PM  For on call review [email or web address].

## 2022-09-05 NOTE — Progress Notes (Addendum)
Physical Therapy Treatment Patient Details Name: Christopher Mason MRN: [id number] DOB: [birth year] Today's Date: 09/05/2022   History of Present Illness Pt is a 62 y.o. male with medical history significant of COPD, schizophrenia, previous spinal surgery, hypothyroidism, hypothyroidism, bipolar disorder, tardive dyskinesia, who presents to the ED with complaints of falls.  MD assessment includes: COPD exacerbation, CAP, AKI, and LLE weakness. MRI impression includes: multilevel lumbar spondylosis, worst at L3-4, where a left subarticular disc extrusion results in compression of the traversing left L4 nerve root in the left lateral recess.  Moderate left neural foraminal narrowing at this level, displacement of the traversing left L3 nerve root in the left lateral recess at L2-3, and severe bilateral neural foraminal narrowing at L5-S1.    PT Comments    Pt asleep in bed ut awakens easily stating he is sleepy today.  10:30 and he has not eaten but stated he is hungry.  Wants to get up to chair to eat.  Assisted with donning KI and while doing so frequently falls asleep.  Sats 83-85% on 4 lpm.  Assisted pt to EOB with mod a x 1 and sats do increase to high 80's with deep breathing and upright position.  He sits EOB for a while and does stand with RW with mod a x 1.  He takes several poor quality sidesteps along EOB but with increasing forward lean.  He is cued and assisted to sitting back on the bed.  He is generally SOB but stated it is similar to yesterday.  Does not feel comfortable transferring to chair today.  Remains sitting for several minutes and asks to stay sitting EOB alone.  Given lethargy he is cued to lay back down for safety as he continues to report feeling sleepy.  Back to bed and assisted with early lunch tray order per his request as breakfast is cold.  RN aware of sats.  Surgery planned for tomorrow. Would recommend +2 for OOB attempts after surgery.    Recommendations for follow up  therapy are one component of a multi-disciplinary discharge planning process, led by the attending physician.  Recommendations may be updated based on patient status, additional functional criteria and insurance authorization.  Follow Up Recommendations  Can patient physically be transported by private vehicle: No    Assistance Recommended at Discharge Frequent or constant Supervision/Assistance  Patient can return home with the following A lot of help with bathing/dressing/bathroom;Assistance with cooking/housework;Direct supervision/assist for medications management;Assist for transportation;Help with stairs or ramp for entrance;Two people to help with walking and/or transfers   Equipment Recommendations  Rolling walker (2 wheels);BSC/3in1;Wheelchair (measurements PT);Wheelchair cushion (measurements PT);Hospital bed    Recommendations for Other Services       Precautions / Restrictions Precautions Precautions: Fall Required Braces or Orthoses: Knee Immobilizer - Left Knee Immobilizer - Left: On when out of bed or walking Restrictions Weight Bearing Restrictions: No Other Position/Activity Restrictions: OK to use LLE KI as needed to the LLE     Mobility  Bed Mobility Overal bed mobility: Needs Assistance Bed Mobility: Supine to Sit   Sidelying to sit: Mod assist   Sit to supine: Min assist     Patient Response: Cooperative  Transfers Overall transfer level: Needs assistance Equipment used: Rolling walker (2 wheels) Transfers: Sit to/from Stand Sit to Stand: Min assist                Ambulation/Gait Ambulation/Gait assistance: Min assist, Mod assist Gait Distance (Feet): 2 Feet Assistive device: Rolling  walker (2 wheels) Gait Pattern/deviations: Step-to pattern Gait velocity: decreased     General Gait Details: small steps along bed with forard lean limited by Clinical research associate for safety.   Stairs             Wheelchair Mobility    Modified Rankin  (Stroke Patients Only)       Balance Overall balance assessment: Needs assistance Sitting-balance support: Feet supported Sitting balance-Leahy Scale: Good     Standing balance support: Bilateral upper extremity supported, During functional activity, Reliant on assistive device for balance Standing balance-Leahy Scale: Fair Standing balance comment: excessive forard lean as he fatigues                            Cognition Arousal/Alertness: Awake/alert Behavior During Therapy: WFL for tasks assessed/performed Overall Cognitive Status: Within Functional Limits for tasks assessed                                 General Comments: pleasant and cooperative        Exercises      General Comments        Pertinent Vitals/Pain Pain Assessment Pain Assessment: Faces Faces Pain Scale: Hurts even more Pain Location: left upper thigh, low back Pain Descriptors / Indicators: Sore Pain Intervention(s): Limited activity within patient's tolerance, Monitored during session, Repositioned    Home Living                          Prior Function            PT Goals (current goals can now be found in the care plan section) Progress towards PT goals: Progressing toward goals    Frequency    Min 3X/week      PT Plan Current plan remains appropriate    Co-evaluation              AM-PAC PT "6 Clicks" Mobility   Outcome Measure  Help needed turning from your back to your side while in a flat bed without using bedrails?: A Lot Help needed moving from lying on your back to sitting on the side of a flat bed without using bedrails?: A Lot Help needed moving to and from a bed to a chair (including a wheelchair)?: A Lot Help needed standing up from a chair using your arms (e.g., wheelchair or bedside chair)?: A Lot Help needed to walk in hospital room?: A Lot Help needed climbing 3-5 steps with a railing? : Total 6 Click Score: 11     End of Session Equipment Utilized During Treatment: Oxygen;Gait belt Activity Tolerance: Patient limited by fatigue Patient left: with call bell/phone within reach;in bed;with bed alarm set Nurse Communication: Mobility status PT Visit Diagnosis: Muscle weakness (generalized) (M62.81);Difficulty in walking, not elsewhere classified (R26.2) Pain - Right/Left: Left     Time: 1610-9604 PT Time Calculation (min) (ACUTE ONLY): 20 min  Charges:  $Therapeutic Activity: 8-22 mins                   Danielle Dess, PTA 09/05/22, 12:50 PM

## 2022-09-05 NOTE — Plan of Care (Signed)

## 2022-09-05 NOTE — Assessment & Plan Note (Signed)
BMI 38.78
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
## 2022-09-06 ENCOUNTER — Inpatient Hospital Stay: Payer: Medicaid Other

## 2022-09-06 ENCOUNTER — Inpatient Hospital Stay: Payer: Medicaid Other | Admitting: Certified Registered"

## 2022-09-06 ENCOUNTER — Encounter: Payer: Self-pay | Admitting: Hospitalist

## 2022-09-06 ENCOUNTER — Other Ambulatory Visit: Payer: Self-pay

## 2022-09-06 ENCOUNTER — Encounter
Admission: EM | Disposition: A | Discharge status: Skilled Nursing Facility | Payer: Self-pay | Source: Home / Self Care | Attending: Internal Medicine

## 2022-09-06 DIAGNOSIS — M5416 Radiculopathy, lumbar region: Secondary | ICD-10-CM | POA: Diagnosis not present

## 2022-09-06 DIAGNOSIS — R29898 Other symptoms and signs involving the musculoskeletal system: Secondary | ICD-10-CM | POA: Diagnosis not present

## 2022-09-06 HISTORY — PX: HEMI-MICRODISCECTOMY LUMBAR LAMINECTOMY LEVEL 1: SHX5846

## 2022-09-06 SURGERY — HEMI-MICRODISCECTOMY LUMBAR LAMINECTOMY LEVEL 1
Anesthesia: General | Site: Spine Lumbar | Laterality: Left

## 2022-09-06 MED ORDER — DEXAMETHASONE SODIUM PHOSPHATE 10 MG/ML IJ SOLN
INTRAMUSCULAR | Status: AC
  Filled 2022-09-06: qty 1

## 2022-09-06 MED ORDER — IPRATROPIUM-ALBUTEROL 0.5-2.5 (3) MG/3ML IN SOLN
RESPIRATORY_TRACT | Status: AC
Start: 2022-09-06 — End: ?
  Filled 2022-09-06: qty 3

## 2022-09-06 MED ORDER — BUPIVACAINE LIPOSOME 1.3 % IJ SUSP
INTRAMUSCULAR | Status: AC
  Filled 2022-09-06: qty 20

## 2022-09-06 MED ORDER — LIDOCAINE HCL (CARDIAC) PF 100 MG/5ML IV SOSY
PREFILLED_SYRINGE | INTRAVENOUS | Status: DC | PRN
  Administered 2022-09-06: 100 mg via INTRAVENOUS

## 2022-09-06 MED ORDER — PROPOFOL 10 MG/ML IV BOLUS
INTRAVENOUS | Status: DC | PRN
  Administered 2022-09-06: 150 mg via INTRAVENOUS

## 2022-09-06 MED ORDER — ALBUMIN HUMAN 5 % IV SOLN: INTRAVENOUS | Status: DC | PRN

## 2022-09-06 MED ORDER — EPINEPHRINE PF 1 MG/ML IJ SOLN
INTRAMUSCULAR | Status: AC
  Filled 2022-09-06: qty 3

## 2022-09-06 MED ORDER — SODIUM CHLORIDE (PF) 0.9 % IJ SOLN
INTRAMUSCULAR | Status: DC | PRN
  Administered 2022-09-06: 45 mL via INTRAMUSCULAR

## 2022-09-06 MED ORDER — CEFAZOLIN SODIUM 1 G IJ SOLR
INTRAMUSCULAR | Status: AC
  Filled 2022-09-06: qty 20

## 2022-09-06 MED ORDER — MIDAZOLAM HCL 2 MG/2ML IJ SOLN
INTRAMUSCULAR | Status: AC
  Filled 2022-09-06: qty 2

## 2022-09-06 MED ORDER — KETAMINE HCL 50 MG/5ML IJ SOSY
PREFILLED_SYRINGE | INTRAMUSCULAR | Status: AC
  Filled 2022-09-06: qty 5

## 2022-09-06 MED ORDER — MIDAZOLAM HCL 2 MG/2ML IJ SOLN
INTRAMUSCULAR | Status: DC | PRN
  Administered 2022-09-06: 2 mg via INTRAVENOUS

## 2022-09-06 MED ORDER — NOREPINEPHRINE 4 MG/250ML-% IV SOLN
INTRAVENOUS | Status: AC
  Filled 2022-09-06: qty 250

## 2022-09-06 MED ORDER — BUPIVACAINE HCL (PF) 0.5 % IJ SOLN
INTRAMUSCULAR | Status: AC
  Filled 2022-09-06: qty 60

## 2022-09-06 MED ORDER — PHENYLEPHRINE 80 MCG/ML (10ML) SYRINGE FOR IV PUSH (FOR BLOOD PRESSURE SUPPORT)
PREFILLED_SYRINGE | INTRAVENOUS | Status: DC | PRN
  Administered 2022-09-06 (×2): 120 ug via INTRAVENOUS

## 2022-09-06 MED ORDER — SODIUM CHLORIDE FLUSH 0.9 % IV SOLN
INTRAVENOUS | Status: AC
  Filled 2022-09-06: qty 20

## 2022-09-06 MED ORDER — FENTANYL CITRATE (PF) 100 MCG/2ML IJ SOLN
INTRAMUSCULAR | Status: DC | PRN
  Administered 2022-09-06: 100 ug via INTRAVENOUS

## 2022-09-06 MED ORDER — SURGIFLO WITH THROMBIN (HEMOSTATIC MATRIX KIT) OPTIME
TOPICAL | Status: DC | PRN
  Administered 2022-09-06: 1 via TOPICAL

## 2022-09-06 MED ORDER — KETAMINE HCL 10 MG/ML IJ SOLN
INTRAMUSCULAR | Status: DC | PRN
  Administered 2022-09-06: 30 mg via INTRAVENOUS

## 2022-09-06 MED ORDER — EPHEDRINE SULFATE (PRESSORS) 50 MG/ML IJ SOLN
INTRAMUSCULAR | Status: DC | PRN
  Administered 2022-09-06: 10 mg via INTRAVENOUS
  Administered 2022-09-06: 15 mg via INTRAVENOUS

## 2022-09-06 MED ORDER — DEXAMETHASONE SODIUM PHOSPHATE 10 MG/ML IJ SOLN
INTRAMUSCULAR | Status: DC | PRN
  Administered 2022-09-06: 10 mg via INTRAVENOUS

## 2022-09-06 MED ORDER — FENTANYL CITRATE (PF) 100 MCG/2ML IJ SOLN
INTRAMUSCULAR | Status: AC
  Filled 2022-09-06: qty 2

## 2022-09-06 MED ORDER — VASOPRESSIN 20 UNIT/ML IV SOLN
INTRAVENOUS | Status: DC | PRN
  Administered 2022-09-06 (×4): 2 [IU] via INTRAVENOUS

## 2022-09-06 MED ORDER — LACTATED RINGERS IV SOLN: INTRAVENOUS | Status: DC

## 2022-09-06 MED ORDER — IPRATROPIUM-ALBUTEROL 0.5-2.5 (3) MG/3ML IN SOLN
3.0000 mL | Freq: Once | RESPIRATORY_TRACT | Status: AC
  Administered 2022-09-06: 3 mL via RESPIRATORY_TRACT

## 2022-09-06 MED ORDER — ONDANSETRON HCL 4 MG/2ML IJ SOLN: 4.0000 mg | Freq: Once | INTRAMUSCULAR | Status: DC | PRN

## 2022-09-06 MED ORDER — FENTANYL CITRATE (PF) 100 MCG/2ML IJ SOLN
25.0000 ug | INTRAMUSCULAR | Status: DC | PRN
  Administered 2022-09-06: 25 ug via INTRAVENOUS

## 2022-09-06 MED ORDER — ONDANSETRON HCL 4 MG/2ML IJ SOLN
INTRAMUSCULAR | Status: DC | PRN
  Administered 2022-09-06: 4 mg via INTRAVENOUS

## 2022-09-06 MED ORDER — ONDANSETRON HCL 4 MG/2ML IJ SOLN
INTRAMUSCULAR | Status: AC
  Filled 2022-09-06: qty 2

## 2022-09-06 MED ORDER — BUPIVACAINE-EPINEPHRINE (PF) 0.5% -1:200000 IJ SOLN
INTRAMUSCULAR | Status: DC | PRN
  Administered 2022-09-06: 10 mL

## 2022-09-06 MED ORDER — SUCCINYLCHOLINE CHLORIDE 200 MG/10ML IV SOSY
PREFILLED_SYRINGE | INTRAVENOUS | Status: DC | PRN
  Administered 2022-09-06: 100 mg via INTRAVENOUS

## 2022-09-06 MED ORDER — 0.9 % SODIUM CHLORIDE (POUR BTL) OPTIME
TOPICAL | Status: DC | PRN
  Administered 2022-09-06: 500 mL

## 2022-09-06 MED ORDER — NOREPINEPHRINE 4 MG/250ML-% IV SOLN
INTRAVENOUS | Status: DC | PRN
  Administered 2022-09-06: 6 ug/min via INTRAVENOUS

## 2022-09-06 MED ORDER — METHYLPREDNISOLONE ACETATE 40 MG/ML IJ SUSP
INTRAMUSCULAR | Status: AC
  Filled 2022-09-06: qty 1

## 2022-09-06 MED ORDER — ALBUMIN HUMAN 5 % IV SOLN
INTRAVENOUS | Status: AC
  Filled 2022-09-06: qty 250

## 2022-09-06 SURGICAL SUPPLY — 52 items
ADH SKN CLS APL DERMABOND .7 (GAUZE/BANDAGES/DRESSINGS) ×1
AGENT HMST KT MTR STRL THRMB (HEMOSTASIS) ×1
APL PRP STRL LF DISP 70% ISPRP (MISCELLANEOUS) ×1
BASIN KIT SINGLE STR (MISCELLANEOUS) ×1 IMPLANT
BUR NEURO DRILL SOFT 3.0X3.8M (BURR) ×1 IMPLANT
CHLORAPREP W/TINT 26 (MISCELLANEOUS) ×1 IMPLANT
CNTNR URN SCR LID CUP LEK RST (MISCELLANEOUS) ×1 IMPLANT
CONT SPEC 4OZ STRL OR WHT (MISCELLANEOUS) ×1
DERMABOND ADVANCED .7 DNX12 (GAUZE/BANDAGES/DRESSINGS) ×1 IMPLANT
DRAPE C ARM PK CFD 31 SPINE (DRAPES) ×1 IMPLANT
DRAPE LAPAROTOMY 100X77 ABD (DRAPES) ×1 IMPLANT
DRAPE MICROSCOPE SPINE 48X150 (DRAPES) ×1 IMPLANT
DRAPE SURG 17X11 SM STRL (DRAPES) ×1 IMPLANT
ELECT EZSTD 165MM 6.5IN (MISCELLANEOUS) ×1
ELECT REM PT RETURN 9FT ADLT (ELECTROSURGICAL) ×1
ELECTRODE EZSTD 165MM 6.5IN (MISCELLANEOUS) IMPLANT
ELECTRODE REM PT RTRN 9FT ADLT (ELECTROSURGICAL) ×1 IMPLANT
EVACUATOR 400CC W/10F 1/8 (MISCELLANEOUS) IMPLANT
GLOVE BIOGEL PI IND STRL 6.5 (GLOVE) ×1 IMPLANT
GLOVE BIOGEL PI IND STRL 8.5 (GLOVE) ×2 IMPLANT
GLOVE SURG SYN 6.5 ES PF (GLOVE) ×2 IMPLANT
GLOVE SURG SYN 6.5 PF PI (GLOVE) ×2 IMPLANT
GLOVE SURG SYN 8.5  E (GLOVE) ×3
GLOVE SURG SYN 8.5 E (GLOVE) ×3 IMPLANT
GLOVE SURG SYN 8.5 PF PI (GLOVE) ×3 IMPLANT
GOWN SRG LRG LVL 4 IMPRV REINF (GOWNS) ×1 IMPLANT
GOWN SRG XL LVL 3 NONREINFORCE (GOWNS) ×1 IMPLANT
GOWN STRL NON-REIN TWL XL LVL3 (GOWNS) ×1
GOWN STRL REIN LRG LVL4 (GOWNS) ×1
GRAFT DURAGEN MATRIX 1WX1L (Tissue) IMPLANT
KIT PREVENA INCISION MGT 13 (CANNISTER) IMPLANT
KIT SPINAL PRONEVIEW (KITS) ×1 IMPLANT
MANIFOLD NEPTUNE II (INSTRUMENTS) ×1 IMPLANT
MARKER SKIN DUAL TIP RULER LAB (MISCELLANEOUS) ×1 IMPLANT
NDL SAFETY ECLIP 18X1.5 (MISCELLANEOUS) ×1 IMPLANT
NS IRRIG 1000ML POUR BTL (IV SOLUTION) ×1 IMPLANT
NS IRRIG 500ML POUR BTL (IV SOLUTION) IMPLANT
PACK LAMINECTOMY NEURO (CUSTOM PROCEDURE TRAY) ×1 IMPLANT
PAD ARMBOARD 7.5X6 YLW CONV (MISCELLANEOUS) ×1 IMPLANT
SURGIFLO W/THROMBIN 8M KIT (HEMOSTASIS) ×1 IMPLANT
SUT DVC VLOC 3-0 CL 6 P-12 (SUTURE) ×1 IMPLANT
SUT ETHILON 3-0 FS-10 30 BLK (SUTURE) ×1
SUT VIC AB 0 CT1 27 (SUTURE) ×1
SUT VIC AB 0 CT1 27XCR 8 STRN (SUTURE) ×1 IMPLANT
SUT VIC AB 2-0 CT1 18 (SUTURE) ×1 IMPLANT
SUTURE EHLN 3-0 FS-10 30 BLK (SUTURE) IMPLANT
SYR 10ML LL (SYRINGE) ×2 IMPLANT
SYR 30ML LL (SYRINGE) ×2 IMPLANT
SYR 3ML LL SCALE MARK (SYRINGE) ×1 IMPLANT
TRAP FLUID SMOKE EVACUATOR (MISCELLANEOUS) ×1 IMPLANT
WATER STERILE IRR 1000ML POUR (IV SOLUTION) ×2 IMPLANT
WATER STERILE IRR 500ML POUR (IV SOLUTION) IMPLANT
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
## 2022-09-06 NOTE — Progress Notes (Signed)
PT Cancellation Note  Patient Details Name: Christopher Mason MRN: [id number] DOB: [birth year]   Cancelled Treatment:    Reason Eval/Treat Not Completed: Patient not medically ready;Patient at procedure or test/unavailable (Pt taken to theater today for procedure. Our services will need to be reordered, unless the potential for specific continuation of existing orders comes to frution. Once ready to resume thearpy, please update orders accordingly.)  3:17 PM, 09/06/22 [name], PT, DPT Physical Therapist - [facility]  [phone] (ASCOM)    Christina Waldrop C 09/06/2022, 3:17 PM

## 2022-09-06 NOTE — Progress Notes (Signed)
OT Cancellation Note  Patient Details Name: Christopher Mason MRN: [id number] DOB: Mar 02, 1961   Cancelled Treatment:    Reason Eval/Treat Not Completed: Patient at procedure or test/ unavailable. Pt is off unit for procedure. Will require new orders for re-evaluation.   Thresa Ross, OTS

## 2022-09-06 NOTE — Anesthesia Postprocedure Evaluation (Signed)
Anesthesia Post Note  Patient: Christopher Mason  Procedure(s) Performed: Lumbar 3-4 microdiscectomy (Left: Spine Lumbar)  Patient location during evaluation: PACU Anesthesia Type: General Level of consciousness: awake and alert, oriented and patient cooperative Pain management: pain level controlled Vital Signs Assessment: post-procedure vital signs reviewed and stable Respiratory status: spontaneous breathing, nonlabored ventilation and respiratory function stable Cardiovascular status: blood pressure returned to baseline and stable Postop Assessment: adequate PO intake Anesthetic complications: no   No notable events documented.   Last Vitals:  Vitals:   09/06/22 1704 09/06/22 1712  BP:    Pulse: 77 74  Resp: 18 17  Temp:    SpO2: (!) 89% (!) 87%    Last Pain:  Vitals:   09/06/22 1704  TempSrc:   PainSc: 0-No pain                 Reed Breech

## 2022-09-06 NOTE — Transfer of Care (Signed)
Immediate Anesthesia Transfer of Care Note  Patient: Christopher Mason  Procedure(s) Performed: Lumbar 3-4 microdiscectomy (Left: Spine Lumbar)  Patient Location: PACU  Anesthesia Type:General  Level of Consciousness: drowsy  Airway & Oxygen Therapy: Patient Spontanous Breathing and Patient connected to face mask oxygen  Post-op Assessment: Report given to RN, Post -op Vital signs reviewed and stable, and Patient moving all extremities  Post vital signs: Reviewed and stable  Last Vitals:  Vitals Value Taken Time  BP 117/74 09/06/22 1601  Temp    Pulse 84 09/06/22 1611  Resp 21 09/06/22 1611  SpO2 87 % 09/06/22 1611  Vitals shown include unvalidated device data.  Last Pain:  Vitals:   09/06/22 1219  TempSrc: Oral  PainSc: 0-No pain      Patients Stated Pain Goal: 0 (09/02/22 2100)  Complications: No notable events documented.

## 2022-09-06 NOTE — Anesthesia Procedure Notes (Signed)
Procedure Name: Intubation Date/Time: 09/06/2022 2:19 PM  Performed by: Katherine Basset, CRNAPre-anesthesia Checklist: Patient identified, Emergency Drugs available, Suction available and Patient being monitored Patient Re-evaluated:Patient Re-evaluated prior to induction Oxygen Delivery Method: Circle system utilized Preoxygenation: Pre-oxygenation with 100% oxygen Induction Type: IV induction Laryngoscope Size: Miller and 3 Grade View: Grade I Tube type: Oral Tube size: 7.5 mm Number of attempts: 1 Airway Equipment and Method: Stylet, Oral airway, LTA kit utilized and Bite block Placement Confirmation: ETT inserted through vocal cords under direct vision, positive ETCO2 and breath sounds checked- equal and bilateral Secured at: 22 cm Tube secured with: Tape Dental Injury: Teeth and Oropharynx as per pre-operative assessment

## 2022-09-06 NOTE — Op Note (Signed)
Indications: [name] is suffering from lumbar radiculopathy and leg weakness. The patient tried and failed conservative management, prompting surgical intervention.  Findings: disc herniation  Preoperative Diagnosis: Lumbar radiculopathy (ICD-10 M54.16), Left Leg weakness Postoperative Diagnosis: same   EBL: 10 ml IVF: see AR ml Drains: 1 drain Disposition: Extubated and Stable to PACU Complications: none  No foley catheter was placed.   Preoperative Note:   Risks of surgery discussed include: infection, bleeding, stroke, coma, death, paralysis, CSF leak, nerve/spinal cord injury, numbness, tingling, weakness, complex regional pain syndrome, recurrent stenosis and/or disc herniation, vascular injury, development of instability, neck/back pain, need for further surgery, persistent symptoms, development of deformity, and the risks of anesthesia. The patient understood these risks and agreed to proceed.  Operative Note:   1) Left L3/4 microdiscectomy  The patient was then brought from the preoperative center with intravenous access established.  The patient underwent general anesthesia and endotracheal tube intubation, and was then rotated on the [place] rail top where all pressure points were appropriately padded.  The skin was then thoroughly cleansed.  Perioperative antibiotic prophylaxis was administered.  Sterile prep and drapes were then applied and a timeout was then observed.  C-arm was brought into the field under sterile conditions, and the L3-4 disc space identified and marked with an incision on the left 1cm lateral to midline.  Once this was complete a 2 cm incision was opened with the use of a #10 blade knife.  The Metrx tubes were sequentially advanced under lateral fluoroscopy until a 18 x 70 mm Metrx tube was placed over the facet and lamina and secured to the bed.    The microscope was then sterilely brought into the field and muscle creep was hemostased with a  bipolar and resected with a pituitary rongeur.  A Bovie extender was then used to expose the spinous process and lamina.  Careful attention was placed to not violate the facet capsule. A 3 mm matchstick drill bit was then used to make a hemi-laminotomy trough until the ligamentum flavum was exposed.  This was extended to the base of the spinous process.  Once this was complete and the underlying ligamentum flavum was visualized, the ligamentum was dissected with an up angle curette and resected with a #2 and #3 mm biting Kerrison.  The laminotomy opening was also expanded in similar fashion and hemostasis was obtained with Surgifoam and a patty as well as bone wax.  The rostral aspect of the caudal level of the lamina was also resected with a #2 biting Kerrison effort to further enhance exposure.  Once the underlying dura was visualized a Penfield 4 was then used to dissect and expose the traversing nerve root.  Once this was identified a nerve root retractor suction was used to mobilize this medially.  The venous plexus was hemostased with Surgifoam and light bipolar use.  A small penfied was then used to make a small annulotomy within the disc space and disc space contents were noted to come through the annulus.    The disc herniation was identified and dissected free using a balltip probe. The pituitary rongeur was used to remove the extruded disc fragments. Once the thecal sac and nerve root were noted to be relaxed and under less tension the ball-tipped feeler was passed along the foramen distally to ensure no residual compression was noted.    The area was irrigated. The tube system was then removed under microscopic visualization and hemostasis was obtained with a  bipolar.  A drain was placed.   The fascial layer was reapproximated with the use of a 0- Vicryl suture.  Subcutaneous tissue layer was reapproximated using 2-0 Vicryl suture.  3-0 nylon and a wound vac were used on the skin. The skin was then  cleansed and Dermabond was used to close the skin opening.  Patient was then rotated back to the preoperative bed awakened from anesthesia and taken to recovery all counts are correct in this case.   I performed the entire procedure with the assistance of Manning Charity PA as an Designer, television/film set. An assistant was required for this procedure due to the complexity.  The assistant provided assistance in tissue manipulation and suction, and was required for the successful and safe performance of the procedure. I performed the critical portions of the procedure.  Venetia Night MD

## 2022-09-06 NOTE — Discharge Instructions (Signed)
Your surgeon has performed an operation on your lumbar spine (low back) to relieve pressure on one or more nerves. Many times, patients feel better immediately after surgery and can "overdo it." Even if you feel well, it is important that you follow these activity guidelines. If you do not let your back heal properly from the surgery, you can increase the chance of a disc herniation and/or return of your symptoms. The following are instructions to help in your recovery once you have been discharged from the hospital.  * It is ok to take NSAIDs after surgery.  Activity    No bending, lifting, or twisting ("BLT"). Avoid lifting objects heavier than 10 pounds (gallon milk jug).  Where possible, avoid household activities that involve lifting, bending, pushing, or pulling such as laundry, vacuuming, grocery shopping, and childcare. Try to arrange for help from friends and family for these activities while your back heals.  Increase physical activity slowly as tolerated.  Taking short walks is encouraged, but avoid strenuous exercise. Do not jog, run, bicycle, lift weights, or participate in any other exercises unless specifically allowed by your doctor. Avoid prolonged sitting, including car rides.  Talk to your doctor before resuming sexual activity.  You should not drive until cleared by your doctor.  Until released by your doctor, you should not return to work or school.  You should rest at home and let your body heal.   You may shower three days after your surgery.  After showering, lightly dab your incision dry. Do not take a tub bath or go swimming for 3 weeks, or until approved by your doctor at your follow-up appointment.  If you smoke, we strongly recommend that you quit.  Smoking has been proven to interfere with normal healing in your back and will dramatically reduce the success rate of your surgery. Please contact [facility] (800-QUIT-NOW) and use the resources at www.[facility].com for  assistance in stopping smoking.  Surgical Incision   If you have a dressing on your incision, you may remove it three days after your surgery. Keep your incision area clean and dry.  If you have staples or stitches on your incision, you should have a follow up scheduled for removal. If you do not have staples or stitches, you will have steri-strips (small pieces of surgical tape) or Dermabond glue. The steri-strips/glue should begin to peel away within about a week (it is fine if the steri-strips fall off before then). If the strips are still in place one week after your surgery, you may gently remove them.  Diet            You may return to your usual diet. Be sure to stay hydrated.  When to Contact Us  Although your surgery and recovery will likely be uneventful, you may have some residual numbness, aches, and pains in your back and/or legs. This is normal and should improve in the next few weeks.  However, should you experience any of the following, contact us immediately: New numbness or weakness Pain that is progressively getting worse, and is not relieved by your pain medications or rest Bleeding, redness, swelling, pain, or drainage from surgical incision Chills or flu-like symptoms Fever greater than 101.0 F (38.3 C) Problems with bowel or bladder functions Difficulty breathing or shortness of breath Warmth, tenderness, or swelling in your calf  Contact Information During office hours (Monday-Friday 9 am to 5 pm), please call your physician at [phone] and ask for Kendelyn Jean After hours and   weekends, please call [phone] and speak with the neurosurgeon on call For a life-threatening emergency, call 911  

## 2022-09-06 NOTE — TOC Progression Note (Signed)
Transition of Care [facility]) - Progression Note    Patient Details  Name: Christopher Mason MRN: [id number] Date of Birth: 29-Mar-1961  Transition of Care [facility]) CM/SW Contact  Marlowe Sax, RN Phone Number: 09/06/2022, 11:12 AM  Clinical Narrative:   planned  microdiscectomy today, TOC to continue to follow     Barriers to Discharge: Continued Medical Work up  Expected Discharge Plan and Services   Discharge Planning Services: CM Consult                     DME Arranged: N/A DME Agency: NA                   Social Determinants of Health (SDOH) Interventions SDOH Screenings   Food Insecurity: No Food Insecurity (08/16/2022)  Housing: Low Risk  (08/16/2022)  Transportation Needs: No Transportation Needs (08/16/2022)  Utilities: Not At Risk (08/16/2022)  Alcohol Screen: Low Risk  (09/06/2021)  Depression (PHQ2-9): Low Risk  (09/06/2021)  Tobacco Use: High Risk (08/18/2022)    Readmission Risk Interventions     No data to display

## 2022-09-06 NOTE — Progress Notes (Signed)
  Progress Note   Patient: Christopher Mason ZOX:[id number] DOB: Sep 09, 1960 DOA: 08/16/2022     20 DOS: the patient was seen and examined on 09/06/2022   Brief hospital course:  62 y.o. male with medical history significant of COPD, schizophrenia, multiple previous spinal surgery, hypothyroidism, bipolar disorder, tardive dyskinesia, who presented to the ED with complaints of a fall. He notes that his left leg weakness and numbness started as left hip pain a few days ago. He has had multiple falls due to the weakness. He denies any other focal weakness. He endorsed increased cough over the last few days in addition to increased shortness of breath.  Patient had steroid injection on 4/26.  5/8.  Still having a lot of back pain.  Does not remember a steroid injection.  Physical therapy recommending rehab but patient does not want to sign over monthly check. 5/9.  Patient able to come down to 2 L of oxygen.  Still having weakness in his left leg. 5/10.  ABG showing a pO2 of 121. Patient remained in the hospital , weaning of oxygen.  Complaining of ongoing pain. Abdominal distention and constipation improved. 5/15, neurosurgery taking patient for microdiscectomy.    Assessment and Plan: Lumbar radiculopathy, acute Patient with disc herniation L3/4.  Patient had steroid injection by interventional radiology on 4/26.  Patient has not improved at this point.  Physical therapy recommending rehab.  Thoracic spine x-ray negative.  Low risk stress test in January 2024.  Neurosurgery planning on microdiscectomy today.  Left leg weakness Secondary to disc herniation, continue to work with PT OT.  Acute respiratory failure with hypoxia (HCC) Patient desaturated in the 80s with working with occupational therapy.  Aggressive bronchodilator therapy.  Given oxygen to keep saturation more than 80%.  CAP (community acquired pneumonia) Treated during hospital course.  Completed antibiotic therapy.  COPD  exacerbation (HCC) Completed steroids.  Continue nebulizer treatments.    AKI (acute kidney injury) (HCC) Creatinine 1.79 on presentation and down to 0.91.  Obesity (BMI 30-39.9) BMI 38.78  Constipation X-ray showing constipation. Continue MiraLAX.  Continue aggressive bowel regimen to avoid postop complications.  Anasarca Switched Lasix to oral.  Echocardiogram shows normal EF.  Clinically improved.  Fatigue Increase levothyroxine with TSH being elevated above 14.  Schizophrenia (HCC) - Continue home amitriptyline, Invega and venlafaxine  HTN (hypertension) Blood pressure stable.  Continue propranolol and amlodipine        Subjective:  Physical Exam: Vitals:   09/06/22 0504 09/06/22 0747 09/06/22 0821 09/06/22 1219  BP: 138/84  (!) 137/90 134/79  Pulse: 73  67 66  Resp: 20  18 16   Temp: 98.1 F (36.7 C)  98.5 F (36.9 C) 98.1 F (36.7 C)  TempSrc:    Oral  SpO2: 90% 96% 92% 93%  Weight:    112.3 kg  Height:    5\' 7"  (1.702 m)   General: Chronically sick looking.  Debilitated.  Flat affect. Cardiovascular: S1-S2 normal.  Regular rate rhythm. Respiratory: Bilateral air.  On 2 L oxygen. Gastrointestinal: Soft.  Nontender.  Bowel sound present. Ext: No deformities.  No cyanosis or edema.     Data Reviewed: Lab reports reviewed today.  No changes needed.  Family Communication: None.  Disposition: Status is: Inpatient Remains inpatient appropriate because: Neurosurgery planning on microdiscectomy today.  Planned Discharge Destination: Home with home health    Time spent: 35 minutes.  Author: Dorcas Carrow, MD 09/06/2022 2:40 PM  For on call review [email or web address].

## 2022-09-06 NOTE — Interval H&P Note (Signed)
History and Physical Interval Note:  09/06/2022 1:57 PM  Christopher Mason  has presented today for surgery, with the diagnosis of lumbar radiculopathy.  The various methods of treatment have been discussed with the patient and family. After consideration of risks, benefits and other options for treatment, the patient has consented to  Procedure(s) with comments: HEMI-MICRODISCECTOMY LUMBAR LAMINECTOMY LEVEL 1 (Left) - Left L3/4 as a surgical intervention.  The patient's history has been reviewed, patient examined, no change in status, stable for surgery.  I have reviewed the patient's chart and labs.  Questions were answered to the patient's satisfaction.    Heart sounds normal no MRG. Chest Clear to Auscultation Bilaterally.   [name]

## 2022-09-07 ENCOUNTER — Encounter: Payer: Self-pay | Admitting: Neurosurgery

## 2022-09-07 DIAGNOSIS — J9811 Atelectasis: Secondary | ICD-10-CM | POA: Diagnosis not present

## 2022-09-07 DIAGNOSIS — J449 Chronic obstructive pulmonary disease, unspecified: Secondary | ICD-10-CM | POA: Diagnosis not present

## 2022-09-07 DIAGNOSIS — J9601 Acute respiratory failure with hypoxia: Secondary | ICD-10-CM | POA: Diagnosis not present

## 2022-09-07 DIAGNOSIS — M5416 Radiculopathy, lumbar region: Secondary | ICD-10-CM | POA: Diagnosis not present

## 2022-09-07 NOTE — Plan of Care (Signed)

## 2022-09-07 NOTE — Progress Notes (Signed)
    Attending Progress Note  History: Christopher Mason is s/p left L3-4 microdiscectomy   POD1: The patient reports soreness in his back since surgery but admits to significant improvement of his preoperative left leg.  Physical Exam: Vitals:   09/06/22 2358 09/07/22 0427  BP: 108/79 120/83  Pulse: 75 80  Resp: (!) 21 18  Temp: 98.1 F (36.7 C) 98.2 F (36.8 C)  SpO2: 92% 90%    AA Ox3 CNI  Strength:5/5 throughout  HV 150 since surgery   Assessment/Plan:  Christopher Mason is a 62 year old senting with left-sided lumbar radiculopathy due to a now 3-4 disc herniation with associated weakness status post microdiscectomy  - mobilize - pain control - DVT prophylaxis - PTOT - Will continue to monitor HV output and wound vac  Manning Charity PA-C Department of Neurosurgery

## 2022-09-07 NOTE — Progress Notes (Signed)
PT Cancellation Note  Patient Details Name: Christopher Mason MRN: [id number] DOB: Jul 08, 1960   Cancelled Treatment:    Reason Eval/Treat Not Completed: Other (comment) (Author returned to room as promised to assist with transfer back to bed and additional transfers training. Pt alreayd back in bed, visitor in room. Pt declines a second PT session at this time.)  2:53 PM, 09/07/22 [name], PT, DPT Physical Therapist - [facility]  [phone] (ASCOM)    Nashea Chumney C 09/07/2022, 2:53 PM

## 2022-09-07 NOTE — Progress Notes (Signed)
Progress Note   Patient: Christopher Mason JXB:[id number] DOB: [birth year] DOA: 08/16/2022     21 DOS: the patient was seen and examined on 09/07/2022   Brief hospital course:  62 y.o. male with medical history significant of COPD, schizophrenia, multiple previous spinal surgery, hypothyroidism, bipolar disorder, tardive dyskinesia, who presented to the ED with complaints of a fall. He notes that his left leg weakness and numbness started as left hip pain a few days ago. He has had multiple falls due to the weakness. He denies any other focal weakness. He endorsed increased cough over the last few days in addition to increased shortness of breath.  Patient had steroid injection on 4/26.  5/8.  Still having a lot of back pain.  Does not remember a steroid injection.  Physical therapy recommending rehab but patient does not want to sign over monthly check. 5/9.  Patient able to come down to 2 L of oxygen.  Still having weakness in his left leg. 5/10.  ABG showing a pO2 of 121. Patient remained in the hospital , weaning of oxygen.  Complaining of ongoing pain. Abdominal distention and constipation improved. 5/15, underwent microdiscectomy.    Assessment and Plan: Lumbar radiculopathy, acute Patient with disc herniation L3/4.  Patient had steroid injection by interventional radiology on 4/26.  Patient has not improved at this point.  Physical therapy recommending rehab.  Thoracic spine x-ray negative.   Patient underwent microdiscectomy 5/15.  Clinically improving.  Postop management including wound VAC management as per surgery.   Start working with PT OT.  Refer to rehab.  Left leg weakness Secondary to disc herniation, continue to work with PT OT.  Acute respiratory failure with hypoxia (HCC) Patient desaturated in the 80s with working with occupational therapy.  Aggressive bronchodilator therapy.  Given oxygen to keep saturation more than 80%.  CAP (community acquired pneumonia) Treated  during hospital course.  Completed antibiotic therapy.  COPD exacerbation (HCC) Completed steroids.  Continue nebulizer treatments.    AKI (acute kidney injury) (HCC) Creatinine 1.79 on presentation and down to 0.91.  Obesity (BMI 30-39.9) BMI 38.78  Constipation X-ray showing constipation. Continue MiraLAX.  Continue aggressive bowel regimen to avoid postop complications.  Anasarca Switched Lasix to oral.  Echocardiogram shows normal EF.  Clinically improved.  Fatigue Increase levothyroxine with TSH being elevated above 14.  Schizophrenia (HCC) - Continue home amitriptyline, Invega and venlafaxine  HTN (hypertension) Blood pressure stable.  Continue propranolol and amlodipine        Subjective: Patient seen and examined.  Patient tells me he has some soreness on the back.  Patient tells me he is able to use his left leg better today.   Physical Exam: Vitals:   09/06/22 2358 09/07/22 0427 09/07/22 0500 09/07/22 0811  BP: 108/79 120/83  122/80  Pulse: 75 80  79  Resp: (!) 21 18  20   Temp: 98.1 F (36.7 C) 98.2 F (36.8 C)  98.5 F (36.9 C)  TempSrc: Oral     SpO2: 92% 90%  94%  Weight:   112.9 kg   Height:       General: Chronically sick looking.  Debilitated.  Pleasant interaction today. Cardiovascular: S1-S2 normal.  Regular rate rhythm. Respiratory: Bilateral air.  On 2 L oxygen. Gastrointestinal: Soft.  Nontender.  Bowel sound present. Ext: No deformities.  No cyanosis or edema. Spinal wound with wound VAC, 150 mL thin hemorrhagic drainage. Left leg weaker than the right leg.  4/5.  Data Reviewed: Lab reports reviewed today.  No changes needed.  Family Communication: None.  Disposition: Status is: Inpatient Remains inpatient appropriate because: Neurosurgery planning on microdiscectomy today.  Planned Discharge Destination: Skilled nursing facility.    Time spent: 35 minutes.  Author: Dorcas Carrow, MD 09/07/2022 1:41 PM  For on call  review [email or web address].

## 2022-09-07 NOTE — Progress Notes (Signed)
Occupational Therapy Re-Evaluation Patient Details Name: Christopher Mason MRN: [id number] DOB: Aug 06, 1960 Today's Date: 09/07/2022   History of present illness Rithvik Walworth is a 61yoM with PMH: COPD, schizophrenia, spinal surgery, hypoTSH, BPD, tardive dyskinesia, who presents to the ED after falls.  MD assessment includes: COPD exacerbation, CAP, AKI, and LLE weakness. MRI impression includes: multilevel lumbar spondylosis, worst at L3-4, where a left subarticular disc extrusion results in compression of the traversing left L4 nerve root in the left lateral recess. New orders received 5/16 s/p left L3-4 microdiscectomy 5/15.   OT comments  Mr Roesel was seen for OT re-evaluation on this date following above surgery. Upon arrival to room pt reclined in bed, agreeable to tx. Pt requires MIN A log rolling to exit bed. SETUP + SUPERVISION don/doff gown in sitting. MIN A + RW bed>chair step t/f, +2 for safety. Left in chair with all needs in reach. Will continue to follow POC. Goals and discharge recommendation remains appropriate.     Recommendations for follow up therapy are one component of a multi-disciplinary discharge planning process, led by the attending physician.  Recommendations may be updated based on patient status, additional functional criteria and insurance authorization.    Assistance Recommended at Discharge Frequent or constant Supervision/Assistance  Patient can return home with the following  A lot of help with bathing/dressing/bathroom;Assistance with cooking/housework;Assist for transportation;Help with stairs or ramp for entrance;Direct supervision/assist for medications management;A little help with walking and/or transfers   Equipment Recommendations  BSC/3in1    Recommendations for Other Services      Precautions / Restrictions Precautions Precautions: Fall;Back Restrictions Weight Bearing Restrictions: No       Mobility Bed Mobility Overal bed mobility: Needs  Assistance Bed Mobility: Rolling, Sidelying to Sit Rolling: Min assist Sidelying to sit: Min assist            Transfers Overall transfer level: Needs assistance Equipment used: Rolling walker (2 wheels) Transfers: Sit to/from Stand Sit to Stand: Min assist     Step pivot transfers: Min assist, +2 safety/equipment           Balance Overall balance assessment: Needs assistance Sitting-balance support: Feet supported Sitting balance-Leahy Scale: Good     Standing balance support: Bilateral upper extremity supported, Reliant on assistive device for balance Standing balance-Leahy Scale: Fair                             ADL either performed or assessed with clinical judgement   ADL Overall ADL's : Needs assistance/impaired                                       General ADL Comments: MAX A don B socks seated EOB. MIN A + RW simulated BSC t/f. SETUP + SUPERVISION don/doff gown in sitting    Extremity/Trunk Assessment Upper Extremity Assessment Upper Extremity Assessment: Overall WFL for tasks assessed   Lower Extremity Assessment Lower Extremity Assessment: Generalized weakness         Cognition Arousal/Alertness: Awake/alert Behavior During Therapy: WFL for tasks assessed/performed Overall Cognitive Status: Within Functional Limits for tasks assessed                                 General Comments: pleasant, perseverative on not being rushed  Pertinent Vitals/ Pain       Pain Assessment Pain Assessment: 0-10 Pain Score: 5  Pain Location: back Pain Descriptors / Indicators: Sore, Aching Pain Intervention(s): Limited activity within patient's tolerance, Repositioned   Frequency  Min 1X/week        Progress Toward Goals  OT Goals(current goals can now be found in the care plan section)  Progress towards OT goals: Progressing toward goals  Acute Rehab OT Goals Patient Stated Goal: to  go home to his dog OT Goal Formulation: With patient Time For Goal Achievement: 09/21/22 Potential to Achieve Goals: Good ADL Goals Pt Will Perform Grooming: with modified independence;standing Pt Will Perform Lower Body Dressing: with modified independence;sit to/from stand Pt Will Transfer to Toilet: with modified independence;ambulating Pt Will Perform Toileting - Clothing Manipulation and hygiene: with modified independence;sit to/from stand  Plan Discharge plan remains appropriate;Frequency remains appropriate    Co-evaluation                 AM-PAC OT "6 Clicks" Daily Activity     Outcome Measure   Help from another person eating meals?: None Help from another person taking care of personal grooming?: A Little Help from another person toileting, which includes using toliet, bedpan, or urinal?: A Little Help from another person bathing (including washing, rinsing, drying)?: A Lot Help from another person to put on and taking off regular upper body clothing?: A Little Help from another person to put on and taking off regular lower body clothing?: A Lot 6 Click Score: 17    End of Session Equipment Utilized During Treatment: Gait belt;Rolling walker (2 wheels)  OT Visit Diagnosis: Unsteadiness on feet (R26.81);Muscle weakness (generalized) (M62.81);History of falling (Z91.81) Pain - Right/Left: Left Pain - part of body: Leg   Activity Tolerance Patient tolerated treatment well   Patient Left in chair;with call bell/phone within reach;with chair alarm set   Nurse Communication          Time: 4098-1191 OT Time Calculation (min): 15 min  Charges: OT General Charges $OT Visit: 1 Visit OT Evaluation $OT Re-eval: 1 Re-eval OT Treatments $Self Care/Home Management : 8-22 mins  Kathie Dike, M.S. OTR/L  09/07/22, 10:45 AM  ascom [phone]

## 2022-09-07 NOTE — Plan of Care (Signed)
  Problem: Education: Goal: Knowledge of General Education information will improve Description: Including pain rating scale, medication(s)/side effects and non-pharmacologic comfort measures Outcome: Progressing   Problem: Health Behavior/Discharge Planning: Goal: Ability to manage health-related needs will improve Outcome: Progressing   Problem: Clinical Measurements: Goal: Ability to maintain clinical measurements within normal limits will improve Outcome: Progressing Goal: Will remain free from infection Outcome: Progressing Goal: Diagnostic test results will improve Outcome: Progressing Goal: Respiratory complications will improve Outcome: Progressing Goal: Cardiovascular complication will be avoided Outcome: Progressing   Problem: Activity: Goal: Risk for activity intolerance will decrease Outcome: Progressing   Problem: Nutrition: Goal: Adequate nutrition will be maintained Outcome: Progressing   Problem: Coping: Goal: Level of anxiety will decrease Outcome: Progressing   Problem: Elimination: Goal: Will not experience complications related to bowel motility Outcome: Progressing Goal: Will not experience complications related to urinary retention Outcome: Progressing   Problem: Pain Managment: Goal: General experience of comfort will improve Outcome: Progressing   Problem: Safety: Goal: Ability to remain free from injury will improve Outcome: Progressing   Problem: Skin Integrity: Goal: Risk for impaired skin integrity will decrease Outcome: Progressing   Problem: Education: Goal: Ability to verbalize activity precautions or restrictions will improve Outcome: Progressing   Problem: Activity: Goal: Ability to avoid complications of mobility impairment will improve Outcome: Progressing Goal: Ability to tolerate increased activity will improve Outcome: Progressing Goal: Will remain free from falls Outcome: Progressing   Problem: Bowel/Gastric: Goal:  Gastrointestinal status for postoperative course will improve Outcome: Progressing   Problem: Pain Management: Goal: Pain level will decrease Outcome: Progressing
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
## 2022-09-07 NOTE — Evaluation (Addendum)
Physical Therapy Evaluation Patient Details Name: Christopher Mason MRN: [id number] DOB: Jul 10, 1960 Today's Date: 09/07/2022  History of Present Illness  Christopher Mason is a 61yoM withPMH: COPD, schizophrenia, spinal surgery, hypoTSH, BPD, tardive dyskinesia, who presents to the ED after falls.  MD assessment includes: COPD exacerbation, CAP, AKI, and LLE weakness. MRI impression includes: multilevel lumbar spondylosis, worst at L3-4, where a left subarticular disc extrusion results in compression of the traversing left L4 nerve root in the left lateral recess. New PT orders received 5/16 s/p left L3-4 microdiscectomy 5/15.  Clinical Impression  Pt seen today for reevaluation s/p microdiscectomy 5/15. Pt reporting resolution of Left leg pain, 9/10 low back pain. Pt already up to chair on entry, xfer to chair with OT prior to entry, per OT equal level of assistance compared to previous days. Pt able to partake in additional leg exercises from chair, increased total reps today and added 5lb AW to RLE with excellent tolerance. Pt fatigued at end of session, wanted a recovery interval before moving to transfers training. Will return later in day to assist with transfers training. Pt still mobilizing dramatically different from his baseline, requires heavy assistance to come to standing and time in standing is limited in time. Pt has limited assistance at home at DC, would benefit from a period of intensive rehab services to prepare for safe return to home.      Recommendations for follow up therapy are one component of a multi-disciplinary discharge planning process, led by the attending physician.  Recommendations may be updated based on patient status, additional functional criteria and insurance authorization.  Follow Up Recommendations Can patient physically be transported by private vehicle: No     Assistance Recommended at Discharge Frequent or constant Supervision/Assistance  Patient can return  home with the following  A lot of help with bathing/dressing/bathroom;Assistance with cooking/housework;Direct supervision/assist for medications management;Assist for transportation;Help with stairs or ramp for entrance;Two people to help with walking and/or transfers    Equipment Recommendations Rolling walker (2 wheels);BSC/3in1;Wheelchair (measurements PT);Wheelchair cushion (measurements PT);Hospital bed  Recommendations for Other Services       Functional Status Assessment Patient has had a recent decline in their functional status and demonstrates the ability to make significant improvements in function in a reasonable and predictable amount of time.     Precautions / Restrictions Precautions Precautions: Fall;Back Restrictions Weight Bearing Restrictions: No Other Position/Activity Restrictions: OK to use LLE KI as needed to the LLE      Mobility  Bed Mobility                    Transfers                        Ambulation/Gait                  Stairs            Wheelchair Mobility    Modified Rankin (Stroke Patients Only)       Balance                                             Pertinent Vitals/Pain Pain Assessment Pain Assessment: 0-10 Pain Score: 9  Pain Intervention(s): Limited activity within patient's tolerance, Monitored during session    Home Living Family/patient expects to be discharged to:: Private residence  Living Arrangements: Alone Christopher Kand.) Available Help at Discharge: Friend(s);Available PRN/intermittently Type of Home: Apartment Home Access: Stairs to enter Entrance Stairs-Rails: Right;Left;Can reach both Entrance Stairs-Number of Steps: 4   Home Layout: One level Home Equipment: Rollator (4 wheels);Shower seat      Prior Function Prior Level of Function : Independent/Modified Independent;History of Falls (last six months)             Mobility Comments: Ind amb without an  AD community distances prior to several days ago when his LLE became weak causing 8 falls in the last 2 days. No other fall history prior to recent falls. ADLs Comments: Ind with ADLs, friend/neighbor assists with IADLs (driving, groceries). Meds are pre-packaged.     Hand Dominance   Dominant Hand: Right    Extremity/Trunk Assessment   Upper Extremity Assessment Upper Extremity Assessment: Overall WFL for tasks assessed    Lower Extremity Assessment Lower Extremity Assessment: Generalized weakness       Communication   Communication: No difficulties  Cognition                                                General Comments      Exercises Other Exercises Other Exercises: Seated RLE: LAQ 1x15, olb, 2x15 @ 5lb AW, RLE marching 2x15 @ 0lb; Other Exercises: Seated LLE: LAQ 2x15, AA/ROM, LLE marching 2x10 AA/ROM, Ankle DF/PF AA/ROM 1x10 (feels fibrotic of joint)   Assessment/Plan    PT Assessment Patient needs continued PT services  PT Problem List Decreased strength;Decreased activity tolerance;Decreased balance;Decreased mobility;Decreased knowledge of use of DME;Decreased safety awareness;Pain       PT Treatment Interventions Gait training;DME instruction;Stair training;Functional mobility training;Therapeutic activities;Therapeutic exercise;Balance training;Patient/family education    PT Goals (Current goals can be found in the Care Plan section)  Acute Rehab PT Goals Patient Stated Goal: To walk better PT Goal Formulation: With patient Time For Goal Achievement: 09/21/22 Potential to Achieve Goals: Good    Frequency Min 3X/week     Co-evaluation               AM-PAC PT "6 Clicks" Mobility  Outcome Measure Help needed turning from your back to your side while in a flat bed without using bedrails?: A Lot Help needed moving from lying on your back to sitting on the side of a flat bed without using bedrails?: A Lot Help needed moving to  and from a bed to a chair (including a wheelchair)?: A Lot Help needed standing up from a chair using your arms (e.g., wheelchair or bedside chair)?: A Lot Help needed to walk in hospital room?: A Lot Help needed climbing 3-5 steps with a railing? : Total 6 Click Score: 11    End of Session Equipment Utilized During Treatment: Oxygen;Gait belt Activity Tolerance: Patient limited by fatigue Patient left: with call bell/phone within reach;in bed;with bed alarm set Nurse Communication: Mobility status PT Visit Diagnosis: Muscle weakness (generalized) (M62.81);Difficulty in walking, not elsewhere classified (R26.2) Pain - Right/Left: Left Pain - part of body: Leg    Time: 1000-1029 PT Time Calculation (min) (ACUTE ONLY): 29 min   Charges:   PT Evaluation $PT Re-evaluation: 1 Re-eval PT Treatments $Therapeutic Exercise: 8-22 mins       10:57 AM, 09/07/22 [name], PT, DPT Physical Therapist - [facility]  [phone]  ASCOM)    Christopher Mason 09/07/2022, 10:51 AM

## 2022-09-07 NOTE — Progress Notes (Signed)
PIV dressing changed 

## 2022-09-08 DIAGNOSIS — J9811 Atelectasis: Secondary | ICD-10-CM

## 2022-09-08 DIAGNOSIS — J449 Chronic obstructive pulmonary disease, unspecified: Secondary | ICD-10-CM | POA: Diagnosis not present

## 2022-09-08 DIAGNOSIS — M5416 Radiculopathy, lumbar region: Secondary | ICD-10-CM | POA: Diagnosis not present

## 2022-09-08 DIAGNOSIS — J9601 Acute respiratory failure with hypoxia: Secondary | ICD-10-CM | POA: Diagnosis not present

## 2022-09-08 MED ORDER — PALIPERIDONE PALMITATE ER 234 MG/1.5ML IM SUSY: 234.0000 mg | PREFILLED_SYRINGE | Freq: Once | INTRAMUSCULAR | Status: DC

## 2022-09-08 MED ORDER — IPRATROPIUM-ALBUTEROL 0.5-2.5 (3) MG/3ML IN SOLN
3.0000 mL | Freq: Two times a day (BID) | RESPIRATORY_TRACT | Status: DC
  Administered 2022-09-08 – 2022-09-12 (×8): 3 mL via RESPIRATORY_TRACT
  Filled 2022-09-08 (×8): qty 3

## 2022-09-08 MED ORDER — PALIPERIDONE PALMITATE ER 234 MG/1.5ML IM SUSY
234.0000 mg | PREFILLED_SYRINGE | INTRAMUSCULAR | Status: DC
  Administered 2022-09-08: 234 mg via INTRAMUSCULAR
  Filled 2022-09-08: qty 1.5

## 2022-09-08 NOTE — Progress Notes (Signed)
NAME:  Christopher Mason, MRN:  [id number], DOB:  1960-12-31, LOS: 22 ADMISSION DATE:  08/16/2022, CONSULTATION DATE: 05 Sep 2022 REFERRING MD: Alford Highland, MD, CHIEF COMPLAINT: Assess respiratory status prior to surgery  History of Present Illness:  Christopher Mason is a 62 year old smoker with a 50 pack year history and a medical history as noted below, who we are evaluating today for assessment of his respiratory status prior to surgery.  He is well-known to [place] [facility]  from clinic follow-up there for COPD.  His baseline FEV1 is 1.59 L or 50% of predicted with an FVC of 2.97 L or 70% predicted and FEV1/FVC of 54% consistent with moderate to severe obstructive defect.  This according to the most recent PFTs on December 2023.  Did not have bronchodilator response at that time.  Patient is not a very good historian he has underlying issues with schizophrenia and sometimes has flight of ideas during interview.  In any event he was admitted 20 days ago to [facility] due to weakness of the lower extremities and increasing shortness of breath.  He noted left lower extremity weakness and numbness and fell on the day of admission which was 24 April.  Been having multiple falls at home due to this.  Prior to admission he had increased cough over his baseline.  He has been pretty much bed bound since he has been in the hospital.  He was noted to have significant lumbar radiculopathy and disc herniation of L3-L4.  Steroid injection by interventional radiology was performed on 26 April but this has not made matters any better.  Neurosurgery needs to do a microdiscectomy to decompress nerve impingement.  He has been maintained on DuoNeb and Pulmicort via nebulizer during this hospitalization.  He has required oxygen due to marginal oxygen saturations.  Chest x-rays have shown significant atelectasis and this is likely due to his bedbound status.  At the time of my interview with him he was laying flat in bed  comfortable.  Had no issues with cough or wheezing.  No chest pain.  No tachypalpitations.  Pertinent  Medical History  COPD moderate to severe Tobacco/marijuana abuse Schizophrenia Lumbar radiculopathy  Significant Hospital Events: Including procedures, antibiotic start and stop dates in addition to other pertinent events   05/15: S/P micro discectomy L3/L4, under GA, no resp complic 05/16: Able to sit up in chair feeling better 05/17: Respiratory wise, feels he is at baseline  Interim History / Subjective:  He feels dyspnea improved.  Still oxygen dependent this will be his "new normal".  Sitting up in chair, no respiratory distress   Objective   Blood pressure (!) 150/87, pulse 89, temperature 99.2 F (37.3 C), resp. rate 20, height 5\' 7"  (1.702 m), weight 112.9 kg, SpO2 94 %.    SpO2: 94 % O2 Flow Rate (L/min): 3 L/min FiO2 (%): 28 %   Intake/Output Summary (Last 24 hours) at 09/08/2022 4098 Last data filed at 09/08/2022 0831 Gross per 24 hour  Intake 3 ml  Output 1625 ml  Net -1622 ml    Filed Weights   09/05/22 0500 09/06/22 1219 09/07/22 0500  Weight: 112.3 kg 112.3 kg 112.9 kg    Examination: GENERAL: Obese gentleman, no acute distress.  Laying in bed, comfortable nasal cannula O2.  No conversational dyspnea. HEAD: Normocephalic, atraumatic.  EYES: Pupils equal, round, reactive to light.  No scleral icterus.  MOUTH: Macroglossia, lingual dyskinesia. NECK: Supple. No thyromegaly. Trachea midline. No JVD.  No adenopathy. PULMONARY:  Good air entry bilaterally.  No adventitious sounds. CARDIOVASCULAR: S1 and S2. Regular rate and rhythm.  ABDOMEN: Significant truncal obesity. MUSCULOSKELETAL: Left arm limited motion due to prior scar tissue (burn), no clubbing, no edema. Decreased muscle mass on the left lower extremity due to prior poliomyelitis  NEUROLOGIC: Patient exhibits lingual tardive dyskinesia, dysarthria due to the same. SKIN: Intact,warm,dry. PSYCH:  Normal mood.  Normal behavior.  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 Appears fairly compensated at present Tolerated GA without difficulty Continue DuoNeb Continue Pulmicort Home regimen is DuoNeb 4 times a day Do not use systemic steroids unless patient exhibits wheezing  Acute respiratory failure with hypoxia Atelectasis, bibasilar Atelectasis likely due to being bedbound Tolerated discectomy Out of bed protocol initiated, continue to increase activity Continue pulmonary hygiene Continue incentive spirometry every 2 hours while awake Wean O2 off as tolerated for sats of 90% or better May need supplemental O2 for home use   Labs   CBC: Recent Labs  Lab 09/05/22 0541  WBC 7.2  HGB 10.4*  HCT 33.9*  MCV 94.7  PLT 237     Basic Metabolic Panel: Recent Labs  Lab 09/03/22 0729 09/05/22 0541  NA 133* 134*  K 4.7 4.0  CL 97* 98  CO2 31 30  GLUCOSE 110* 116*  BUN 27* 27*  CREATININE 1.06 0.91  CALCIUM 9.2 8.9    GFR: Estimated Creatinine Clearance: 101 mL/min (by C-G formula based on SCr of 0.91 mg/dL). Recent Labs  Lab 09/05/22 0541  WBC 7.2     Liver Function Tests: No results for input(s): "AST", "ALT", "ALKPHOS", "BILITOT", "PROT", "ALBUMIN" in the last 168 hours. No results for input(s): "LIPASE", "AMYLASE" in the last 168 hours. No results for input(s): "AMMONIA" in the last 168 hours.  ABG    Component Value Date/Time   PHART 7.5 (H) 09/01/2022 1052   PCO2ART 42 09/01/2022 1052   PO2ART 121 (H) 09/01/2022 1052   HCO3 32.8 (H) 09/01/2022 1052   O2SAT 99.1 09/01/2022 1052     Coagulation Profile: Recent Labs  Lab 09/05/22 0541  INR 1.0     Cardiac Enzymes: No results for input(s): "CKTOTAL", "CKMB", "CKMBINDEX", "TROPONINI" in the last 168 hours.  HbA1C: Hgb A1c MFr Bld  Date/Time Value Ref Range Status  07/04/2017 12:19 PM 5.4 4.8 - 5.6 % Final    Comment:    (NOTE) Pre diabetes:           5.7%-6.4% Diabetes:              >6.4% Glycemic control for   <7.0% adults with diabetes   11/20/2014 02:43 AM 5.3 4.0 - 6.0 % Final    CBG: No results for input(s): "GLUCAP" in the last 168 hours.  Review of Systems:   A 10 point review of systems was performed and it is as noted above otherwise negative.  Allergies Allergies  Allergen Reactions   Aspirin Nausea And Vomiting and Swelling    Home Medications  Prior to Admission medications   Medication Sig Start Date End Date Taking? Authorizing Provider  albuterol (VENTOLIN HFA) 108 (90 Base) MCG/ACT inhaler Inhale 2 puffs into the lungs every 6 (six) hours as needed for wheezing or shortness of breath. 03/01/22  Yes Abernathy, Arlyss Repress, NP  amitriptyline (ELAVIL) 50 MG tablet Take 1 tablet (50 mg total) by mouth at bedtime. 07/23/17  Yes McNew, Ileene Hutchinson, MD  amLODipine (NORVASC) 10 MG tablet  TAKE 1 TABLET (10 MG TOTAL) BY MOUTH DAILY. 03/23/22  Yes Abernathy, Arlyss Repress, NP  atorvastatin (LIPITOR) 20 MG tablet Take 1 tablet (20 mg total) by mouth daily. 04/14/22  Yes O'Neal, Ronnald Ramp, MD  docusate sodium (COLACE) 100 MG capsule TAKE 1 CAPSULE (100 MG TOTAL) BY MOUTH 2 (TWO) TIMES DAILY. 03/23/22  Yes Abernathy, Arlyss Repress, NP  folic acid (FOLVITE) 1 MG tablet TAKE 1 TABLET (1 MG TOTAL) BY MOUTH DAILY. 07/13/22 10/11/22 Yes Abernathy, Arlyss Repress, NP  INVEGA SUSTENNA 234 MG/1.5ML injection Inject into the muscle. Every 4 weeks. 01/26/22  Yes [provider]  ipratropium-albuterol (DUONEB) 0.5-2.5 (3) MG/3ML SOLN Take 3 mLs by nebulization every 6 (six) hours as needed. 05/01/22  Yes Abernathy, Arlyss Repress, NP  levothyroxine (SYNTHROID) 137 MCG tablet TAKE 1 TABLET (137 MCG TOTAL) BY MOUTH DAILY BEFORE BREAKFAST. 07/13/22  Yes Abernathy, Alyssa, NP  lisinopril (ZESTRIL) 40 MG tablet TAKE 1 TABLET (40 MG TOTAL) BY MOUTH DAILY. 03/23/22  Yes Abernathy, Arlyss Repress, NP  meloxicam (MOBIC) 15 MG tablet Take 1 tablet (15 mg total) by mouth daily. In am  with breakfast 07/06/22  Yes Abernathy, Alyssa, NP  methocarbamol (ROBAXIN) 750 MG tablet Take 1 tablet (750 mg total) by mouth every 8 (eight) hours as needed for muscle spasms. 07/06/22  Yes Abernathy, Alyssa, NP  nicotine (NICODERM CQ - DOSED IN MG/24 HOURS) 21 mg/24hr patch PLACE 1 PATCH (21 MG TOTAL) ONTO THE SKIN DAILY. 07/13/22  Yes Abernathy, Arlyss Repress, NP  pantoprazole (PROTONIX) 40 MG tablet Take 1 tablet (40 mg total) by mouth daily. 04/25/22  Yes Abernathy, Arlyss Repress, NP  propranolol (INDERAL) 10 MG tablet TAKE 1 TABLET (10 MG TOTAL) BY MOUTH 3 (THREE) TIMES DAILY. 07/13/22  Yes Abernathy, Arlyss Repress, NP  tamsulosin (FLOMAX) 0.4 MG CAPS capsule TAKE ONE CAPSULE BY MOUTH EVERY DAY 02/17/21  Yes Stoioff, Verna Czech, MD  traZODone (DESYREL) 100 MG tablet Take 100 mg by mouth at bedtime.   Yes [provider]  venlafaxine XR (EFFEXOR-XR) 75 MG 24 hr capsule Take 1 capsule (75 mg total) by mouth daily with breakfast. 07/24/17  Yes McNew, Ileene Hutchinson, MD  Vitamin D, Ergocalciferol, (DRISDOL) 1.25 MG (50000 UNIT) CAPS capsule TAKE 1 CAPSULE (50,000 UNITS TOTAL) BY MOUTH ONCE A WEEK. { 04/25/22  Yes Abernathy, Alyssa, NP  solifenacin (VESICARE) 10 MG tablet Take 10 mg by mouth daily.    [provider]   Scheduled Meds:  amitriptyline  25 mg Oral QHS   amLODipine  10 mg Oral Daily   budesonide (PULMICORT) nebulizer solution  0.25 mg Nebulization BID   enoxaparin (LOVENOX) injection  0.5 mg/kg Subcutaneous Q24H   folic acid  1 mg Oral Daily   furosemide  20 mg Oral BID   ipratropium-albuterol  3 mL Nebulization TID   levothyroxine  150 mcg Oral QAC breakfast   magnesium citrate  1 Bottle Oral Once   multivitamin with minerals  1 tablet Oral Daily   nicotine  21 mg Transdermal Daily   pantoprazole  40 mg Oral Daily   polyethylene glycol  34 g Oral BID   propranolol  10 mg Oral TID   sodium chloride flush  3 mL Intravenous Q12H   tamsulosin  0.4 mg Oral Daily   thiamine  100 mg Oral Daily    traZODone  100 mg Oral QHS   venlafaxine XR  75 mg Oral Q breakfast   Continuous Infusions: PRN Meds:.Reviewed    Level 2 follow-up    Will  see back on Monday 5/20, may call with pulmonary questions in the interim as needed.  Gailen Shelter, MD Advanced Bronchoscopy PCCM [facility]    *This note was dictated using voice recognition software/Dragon.  Despite best efforts to proofread, errors can occur which can change the meaning. Any transcriptional errors that result from this process are unintentional and may not be fully corrected at the time of dictation.

## 2022-09-08 NOTE — TOC Progression Note (Signed)
Transition of Care [facility]) - Progression Note    Patient Details  Name: Christopher Mason MRN: [id number] Date of Birth: 1961-01-26  [facility] CM/SW Contact  Marlowe Sax, RN Phone Number: 09/08/2022, 10:00 AM  Clinical Narrative:    Pt still mobilizing dramatically different from his baseline, requires heavy assistance to come to standing and time in standing is limited in time. Pt has limited assistance at home at DC he lives alone and only has [employer] coming occasionally to check on him [facility] will continue to look for Resources      Barriers to Discharge: Continued Medical Work up  Expected Discharge Plan and Services   Discharge Planning Services: CM Consult                     DME Arranged: N/A DME Agency: NA                   Social Determinants of Health (SDOH) Interventions SDOH Screenings   Food Insecurity: No Food Insecurity (08/16/2022)  Housing: Low Risk  (08/16/2022)  Transportation Needs: No Transportation Needs (08/16/2022)  Utilities: Not At Risk (08/16/2022)  Alcohol Screen: Low Risk  (09/06/2021)  Depression (PHQ2-9): Low Risk  (09/06/2021)  Tobacco Use: High Risk (09/07/2022)    Readmission Risk Interventions     No data to display

## 2022-09-08 NOTE — Progress Notes (Signed)
PT Cancellation Note  Patient Details Name: Christopher Mason MRN: [id number] DOB: May 22, 1960   Cancelled Treatment:    Reason Eval/Treat Not Completed: Pain limiting ability to participate. Upon entry to room pt declining PT due to L groin and LBP. Pt also declining modified treatment with LE exercises or repositioning. PT to re-attempt at a later time/date.    Delphia Grates. Fairly IV, PT, DPT Physical Therapist- [facility]  09/08/2022, 3:45 PM

## 2022-09-08 NOTE — Progress Notes (Signed)
    Attending Progress Note  History: Christopher Mason is s/p left L3-4 microdiscectomy   POD2: Pt reporting back and left leg soreness this morning POD1: The patient reports soreness in his back since surgery but admits to significant improvement of his preoperative left leg.  Physical Exam: Vitals:   09/08/22 0805 09/08/22 0813  BP: (!) 150/87 (!) 150/87  Pulse:  89  Resp:  20  Temp:  99.2 F (37.3 C)  SpO2:  94%    AA Ox3 CNI  Strength:5/5 throughout  Incision covered with wound vac   Assessment/Plan:  Christopher Mason is a 62 year old senting with left-sided lumbar radiculopathy due to a now 3-4 disc herniation with associated weakness status post microdiscectomy  - mobilize - pain control - DVT prophylaxis - PTOT; encouraged patient to work hard with therapy today - Will continue to monitor HV output and wound vac  Manning Charity PA-C Department of Neurosurgery

## 2022-09-08 NOTE — Progress Notes (Signed)
Progress Note   Patient: Christopher Mason ZOX:[id number] DOB: [birth year] DOA: 08/16/2022     22 DOS: the patient was seen and examined on 09/08/2022   Brief hospital course:  62 y.o. male with medical history significant of COPD, schizophrenia, multiple previous spinal surgery, hypothyroidism, bipolar disorder, tardive dyskinesia, who presented to the ED with complaints of a fall. He notes that his left leg weakness and numbness started as left hip pain a few days ago. He has had multiple falls due to the weakness. He denies any other focal weakness. He endorsed increased cough over the last few days in addition to increased shortness of breath.  Patient had steroid injection on 4/26.  5/8.  Still having a lot of back pain.  Does not remember a steroid injection.  Physical therapy recommending rehab but patient does not want to sign over monthly check. 5/9.  Patient able to come down to 2 L of oxygen.  Still having weakness in his left leg. 5/10.  ABG showing a pO2 of 121. Patient remained in the hospital , weaning of oxygen.  Complaining of ongoing pain. Abdominal distention and constipation improved. 5/15, underwent microdiscectomy with some clinical improvement. 5/17, needs skilled nursing facility.    Assessment and Plan: Lumbar radiculopathy, acute Patient with disc herniation L3/4.  Patient had steroid injection by interventional radiology on 4/26.  Patient has not improved at this point.  Physical therapy recommending rehab.  Thoracic spine x-ray negative.   Patient underwent microdiscectomy 5/15.  Clinically improving.   Work with PT OT.  Refer to SNF. Pain management with oral pain medication along with bowel regimen. JP drain can likely come out.  Left leg weakness Secondary to disc herniation, continue to work with PT OT.  Acute respiratory failure with hypoxia (HCC) Does have history of COPD.  Currently on bronchodilator therapy.  Keep on oxygen to keep saturation more than  90%.  CAP (community acquired pneumonia) Treated during hospital course.  Completed antibiotic therapy.  COPD exacerbation (HCC) Completed steroids.  Continue nebulizer treatments.    AKI (acute kidney injury) (HCC) Creatinine 1.79 on presentation and down to 0.91.  Obesity (BMI 30-39.9) BMI 38.78  Constipation X-ray showing constipation. Continue MiraLAX.  Continue aggressive bowel regimen to avoid postop complications.  Anasarca Switched Lasix to oral.  Echocardiogram shows normal EF.  Clinically improved.  Fatigue Increase levothyroxine with TSH being elevated above 14.  Schizophrenia (HCC) - Continue home amitriptyline, Invega and venlafaxine  HTN (hypertension) Blood pressure stable.  Continue propranolol and amlodipine  Patient is stable to transfer to SNF when bed available.        Subjective: Patient seen and examined.  No overnight events.  Does have significant pain on his lower back, using oral pain medications.   Physical Exam: Vitals:   09/08/22 0026 09/08/22 0759 09/08/22 0805 09/08/22 0813  BP: 129/70  (!) 150/87 (!) 150/87  Pulse: 82 98  89  Resp: (!) 24 20  20   Temp: 98.1 F (36.7 C)   99.2 F (37.3 C)  TempSrc:      SpO2: 91% 92%  94%  Weight:      Height:       General: Chronically sick looking.  Debilitated.  Pleasant to interaction. Cardiovascular: S1-S2 normal.  Regular rate rhythm. Respiratory: Good bilateral air entry.  On 2 L oxygen. Gastrointestinal: Soft.  Nontender.  Bowel sounds present. Ext: No deformities.  No cyanosis or edema. Spinal wound with JP drain on suction vacuum, no  drainage.   Left leg significantly weaker than the right leg.   Patient is able to hardly lift left leg against gravity.     Data Reviewed: Lab reports reviewed today.  No changes needed.  Family Communication: None.  Disposition: Status is: Inpatient Remains inpatient appropriate because: Unsafe discharge disposition plan.  Planned Discharge  Destination: Skilled nursing facility.    Time spent: 35 minutes.  Author: Dorcas Carrow, MD 09/08/2022 10:55 AM  For on call review [email or web address].

## 2022-09-08 NOTE — Plan of Care (Signed)
  Problem: Education: Goal: Knowledge of General Education information will improve Description: Including pain rating scale, medication(s)/side effects and non-pharmacologic comfort measures Outcome: Progressing   Problem: Health Behavior/Discharge Planning: Goal: Ability to manage health-related needs will improve Outcome: Progressing   Problem: Clinical Measurements: Goal: Will remain free from infection Outcome: Progressing Goal: Diagnostic test results will improve Outcome: Progressing Goal: Respiratory complications will improve Outcome: Progressing   Problem: Activity: Goal: Risk for activity intolerance will decrease Outcome: Progressing   Problem: Nutrition: Goal: Adequate nutrition will be maintained Outcome: Progressing   Problem: Coping: Goal: Level of anxiety will decrease Outcome: Progressing   

## 2022-09-09 DIAGNOSIS — M5416 Radiculopathy, lumbar region: Secondary | ICD-10-CM | POA: Diagnosis not present

## 2022-09-09 NOTE — Progress Notes (Signed)
Progress Note   Patient: Christopher Mason ZOX:[id number] DOB: 22-Jul-1960 DOA: 08/16/2022     23 DOS: the patient was seen and examined on 09/09/2022   Brief hospital course:  62 y.o. male with medical history significant of COPD, schizophrenia, multiple previous spinal surgery, hypothyroidism, bipolar disorder, tardive dyskinesia, who presented to the ED with complaints of a fall. He notes that his left leg weakness and numbness started as left hip pain a few days ago. He has had multiple falls due to the weakness. He denies any other focal weakness. He endorsed increased cough over the last few days in addition to increased shortness of breath.  Patient had steroid injection on 4/26.  5/8.  Still having a lot of back pain.  Does not remember a steroid injection.  Physical therapy recommending rehab but patient does not want to sign over monthly check. 5/9.  Patient able to come down to 2 L of oxygen.  Still having weakness in his left leg. 5/10.  ABG showing a pO2 of 121. Patient remained in the hospital , weaning of oxygen.  Complaining of ongoing pain. Abdominal distention and constipation improved. 5/15, underwent microdiscectomy with some clinical improvement. 5/17, needs skilled nursing facility.    Assessment and Plan: Lumbar radiculopathy, acute Patient with disc herniation L3/4.  Patient had steroid injection by interventional radiology on 4/26.  Patient has not improved at this point.  Physical therapy recommending rehab.  Thoracic spine x-ray negative.   Patient underwent microdiscectomy 5/15.  Clinically improving.   Work with PT OT.  Refer to SNF. Pain management with oral pain medication along with bowel regimen. Neurosurgery plans to remove drain tomorrow  Left leg weakness Secondary to disc herniation, continue to work with PT OT.  Acute respiratory failure with hypoxia (HCC) Does have history of COPD.  Currently on bronchodilator therapy.  Keep on oxygen to keep  saturation more than 90%.  CAP (community acquired pneumonia) Treated during hospital course.  Completed antibiotic therapy.  COPD exacerbation (HCC) Completed steroids.  Continue nebulizer treatments.    AKI (acute kidney injury) (HCC) Creatinine 1.79 on presentation and down to 0.91.  Obesity (BMI 30-39.9) BMI 38.78  Constipation X-ray showing constipation. Continue MiraLAX.  Continue aggressive bowel regimen to avoid postop complications.  Anasarca Switched Lasix to oral.  Echocardiogram shows normal EF.  Clinically improved.  Fatigue Increase levothyroxine with TSH being elevated above 14.  Schizophrenia (HCC) - Continue home amitriptyline, Invega and venlafaxine  HTN (hypertension) Blood pressure stable.  Continue propranolol and amlodipine  Patient is stable to transfer to SNF when bed available.        Subjective: Patient awake resting in bed this AM.  Reports eyes heavy, feels very tired.  Having pain in his back but states the left leg feeling better since surgery a few days ago.  He got to visit with his dog, grateful for that.  No other acute complaints.    Physical Exam: Vitals:   09/08/22 2114 09/08/22 2355 09/09/22 0723 09/09/22 0802  BP: 125/81 125/76 (!) 132/90   Pulse: 78 82 83   Resp: 17 19 20    Temp: 98.1 F (36.7 C) 97.6 F (36.4 C) (!) 101.1 F (38.4 C) 99 F (37.2 C)  TempSrc:      SpO2: 90% (!) 89% 95% 91%  Weight:      Height:       General exam: awake, alert, no acute distress, obese, chronically ill appearing HEENT: nasal cannula in place, moist  mucus membranes, hearing grossly normal  Respiratory system: lungs clear but diminished throughout, no wheezes, normal respiratory effort at rest. Cardiovascular system: normal S1/S2, RRR, no pedal edema.   Central nervous system: A&O x 3. no gross focal neurologic deficits, normal speech Extremities: no edema, normal tone Skin: dry, intact, normal temperature Psychiatry: normal mood,  congruent affect, judgement and insight appear normal Spinal drain in place.     Data Reviewed: Lab reports reviewed today.  No changes needed.  Family Communication: None.  Disposition: Status is: Inpatient Remains inpatient appropriate because: Unsafe discharge disposition plan.  Planned Discharge Destination: Skilled nursing facility.    Time spent: 25 minutes.  Author: Pennie Banter, DO 09/09/2022 2:12 PM  For on call review [email or web address].

## 2022-09-10 ENCOUNTER — Inpatient Hospital Stay: Payer: Medicaid Other

## 2022-09-10 DIAGNOSIS — M5416 Radiculopathy, lumbar region: Secondary | ICD-10-CM | POA: Diagnosis not present

## 2022-09-10 LAB — BASIC METABOLIC PANEL
Anion gap: 4 — ABNORMAL LOW (ref 5–15)
BUN: 19 mg/dL (ref 8–23)
CO2: 30 mmol/L (ref 22–32)
Calcium: 8.7 mg/dL — ABNORMAL LOW (ref 8.9–10.3)
Chloride: 99 mmol/L (ref 98–111)
Creatinine, Ser: 0.83 mg/dL (ref 0.61–1.24)
GFR, Estimated: >60 mL/min (ref >60)
Glucose, Bld: 122 mg/dL — ABNORMAL HIGH (ref 70–99)
Potassium: 4.6 mmol/L (ref 3.5–5.1)
Sodium: 133 mmol/L — ABNORMAL LOW (ref 135–145)

## 2022-09-10 LAB — CBC
HCT: 30.7 % — ABNORMAL LOW (ref 39.0–52.0)
Hemoglobin: 9.2 g/dL — ABNORMAL LOW (ref 13.0–17.0)
MCH: 28.3 pg (ref 26.0–34.0)
MCHC: 30 g/dL (ref 30.0–36.0)
MCV: 94.5 fL (ref 80.0–100.0)
Platelets: 232 10*3/uL (ref 150–400)
RBC: 3.25 MIL/uL — ABNORMAL LOW (ref 4.22–5.81)
RDW: 15.5 % (ref 11.5–15.5)
WBC: 7.2 10*3/uL (ref 4.0–10.5)
nRBC: 0 % (ref 0.0–0.2)

## 2022-09-10 LAB — T4, FREE: Free T4: 0.77 ng/dL (ref 0.61–1.12)

## 2022-09-10 NOTE — Plan of Care (Signed)
  Problem: Clinical Measurements: Goal: Ability to maintain clinical measurements within normal limits will improve Outcome: Progressing Goal: Will remain free from infection Outcome: Progressing Goal: Respiratory complications will improve Outcome: Progressing   Problem: Activity: Goal: Risk for activity intolerance will decrease Outcome: Progressing   Problem: Safety: Goal: Ability to remain free from injury will improve Outcome: Progressing   Problem: Skin Integrity: Goal: Risk for impaired skin integrity will decrease Outcome: Progressing   Problem: Activity: Goal: Ability to avoid complications of mobility impairment will improve Outcome: Progressing Goal: Ability to tolerate increased activity will improve Outcome: Progressing Goal: Will remain free from falls Outcome: Progressing   Problem: Bowel/Gastric: Goal: Gastrointestinal status for postoperative course will improve Outcome: Progressing   Problem: Pain Management: Goal: Pain level will decrease Outcome: Progressing

## 2022-09-10 NOTE — Progress Notes (Addendum)
Progress Note   Patient: Christopher Mason WJX:[id number] DOB: January 12, 1961 DOA: 08/16/2022     24 DOS: the patient was seen and examined on 09/10/2022   Brief hospital course:  62 y.o. male with medical history significant of COPD, schizophrenia, multiple previous spinal surgery, hypothyroidism, bipolar disorder, tardive dyskinesia, who presented to the ED with complaints of a fall. He notes that his left leg weakness and numbness started as left hip pain a few days ago. He has had multiple falls due to the weakness. He denies any other focal weakness. He endorsed increased cough over the last few days in addition to increased shortness of breath.  Patient had steroid injection on 4/26.  5/8.  Still having a lot of back pain.  Does not remember a steroid injection.  Physical therapy recommending rehab but patient does not want to sign over monthly check. 5/9.  Patient able to come down to 2 L of oxygen.  Still having weakness in his left leg. 5/10.  ABG showing a pO2 of 121. Patient remained in the hospital , weaning of oxygen.  Complaining of ongoing pain. Abdominal distention and constipation improved. 5/15, underwent microdiscectomy with some clinical improvement. 5/17, needs skilled nursing facility. 5/18-19 - O2 needs rising again, not wheezing   Assessment and Plan: Lumbar radiculopathy, acute Patient with disc herniation L3/4.  Patient had steroid injection by interventional radiology on 4/26.   No improvement in weeks after injection, still unable to ambulate.   Patient underwent microdiscectomy 5/15.   Work with PT OT.  Refer to SNF. Pain management with oral pain medication along with bowel regimen. Neurosurgery removed drain today (5/19)  Left leg weakness Secondary to disc herniation, continue to work with PT OT.  Acute respiratory failure with hypoxia (HCC) Does have history of COPD.   Continue bronchodilator therapy.   Keep on oxygen to keep saturation more than  90%. 5/19 - repeat chest xray given rising O2 needs 4 >> 5 l/min (had been weaned to 2 l/min)  CAP (community acquired pneumonia) Treated during hospital course.  Completed antibiotic therapy.  COPD exacerbation (HCC) Completed steroids.  Continue nebulizer treatments.   Seen by pulmonology pre-operatively.  AKI (acute kidney injury) (HCC) Creatinine 1.79 on presentation and down to 0.91.  Obesity (BMI 30-39.9) BMI 38.78 Complicates overall care and prognosis.  Recommend lifestyle modifications including physical activity and diet for weight loss and overall long-term health.  Constipation X-ray showing constipation. Continue MiraLAX.  Continue aggressive bowel regimen to avoid postop complications.  Normocytic anemia - check anemia panel with AM labs  Anasarca Switched Lasix to oral, on 20 mg BID.   Echocardiogram shows normal EF.   Clinically improved.  Fatigue Increased levothyroxine with TSH being elevated above 14.  Free T4 is normal.  Schizophrenia (HCC) - Continue home amitriptyline, Invega and venlafaxine  HTN (hypertension) Blood pressure stable.  Continue propranolol and amlodipine    Patient is stable to transfer to SNF when bed available.        Subjective: Patient awake resting in bed this AM.  Reports needs to have a BM, asking for bed pan.  Unable to tell me other acute complaints or issues at this time, just repeats needing to have BM.  Denies chest pain or trouble breathing.   Physical Exam: Vitals:   09/09/22 2255 09/09/22 2328 09/10/22 0616 09/10/22 0816  BP: 129/74 (!) 131/94  139/89  Pulse: 86 89  85  Resp:  20  16  Temp:  98.6  F (37 C)  98.4 F (36.9 C)  TempSrc:      SpO2:  90%  93%  Weight:   112.7 kg   Height:       General exam: awake, alert, no acute distress, obese, chronically ill appearing HEENT: nasal cannula in place, moist mucus membranes, hearing grossly normal  Respiratory system: lungs clear but diminished  throughout, no wheezes, normal respiratory effort at rest. On 5 L/min [place] O2 Cardiovascular system: normal S1/S2, RRR, no pedal edema.   Central nervous system: A&O x 3. no gross focal neurologic deficits, normal speech Extremities: no edema, normal tone Skin: dry, intact, normal temperature Psychiatry: normal mood, congruent affect, judgement and insight appear normal Spinal drain removed and occlusive honeycomb dressing in place.     Data Reviewed: Notable labs -- Na 133, glucose 122, Ca 8.7, gap 4, Hbg 9.2   Family Communication: None.  Disposition: Status is: Inpatient Remains inpatient appropriate because: Unsafe discharge disposition plan.  Planned Discharge Destination: Skilled nursing facility.    Time spent: 35 minutes.  Author: Pennie Banter, DO 09/10/2022 2:15 PM  For on call review [email or web address].

## 2022-09-10 NOTE — Progress Notes (Signed)
    Attending Progress Note  History: Humberto Wedlake is s/p left L3-4 microdiscectomy   POD3: No new findings POD1: The patient reports soreness in his back since surgery but admits to significant improvement of his preoperative left leg.  Physical Exam: Vitals:   09/09/22 2328 09/10/22 0816  BP: (!) 131/94 139/89  Pulse: 89 85  Resp: 20 16  Temp: 98.6 F (37 C) 98.4 F (36.9 C)  SpO2: 90% 93%    AA Ox3 CNI  Strength: MAEW, LLE somewhat better HV 0 - removed   Assessment/Plan:  Kamarri Gandy is a 62 year old senting with left-sided lumbar radiculopathy due to a now 3-4 disc herniation with associated weakness status post microdiscectomy  - mobilize - pain control - DVT prophylaxis - PTOT - drain removed, wound vac removed - OK to remove current dressings on 5/21  Venetia Night MD Department of Neurosurgery

## 2022-09-10 NOTE — Plan of Care (Signed)
  Problem: Clinical Measurements: Goal: Ability to maintain clinical measurements within normal limits will improve Outcome: Progressing   Problem: Activity: Goal: Risk for activity intolerance will decrease Outcome: Progressing   Problem: Safety: Goal: Ability to remain free from injury will improve Outcome: Progressing   Problem: Skin Integrity: Goal: Risk for impaired skin integrity will decrease Outcome: Progressing   Problem: Activity: Goal: Ability to avoid complications of mobility impairment will improve Outcome: Progressing Goal: Ability to tolerate increased activity will improve Outcome: Progressing Goal: Will remain free from falls Outcome: Progressing   Problem: Bowel/Gastric: Goal: Gastrointestinal status for postoperative course will improve Outcome: Progressing   Problem: Pain Management: Goal: Pain level will decrease Outcome: Progressing

## 2022-09-11 DIAGNOSIS — M5416 Radiculopathy, lumbar region: Secondary | ICD-10-CM | POA: Diagnosis not present

## 2022-09-11 LAB — CBC
HCT: 31.4 % — ABNORMAL LOW (ref 39.0–52.0)
Hemoglobin: 9.6 g/dL — ABNORMAL LOW (ref 13.0–17.0)
MCH: 28.7 pg (ref 26.0–34.0)
MCHC: 30.6 g/dL (ref 30.0–36.0)
MCV: 94 fL (ref 80.0–100.0)
Platelets: 252 10*3/uL (ref 150–400)
RBC: 3.34 MIL/uL — ABNORMAL LOW (ref 4.22–5.81)
RDW: 15.2 % (ref 11.5–15.5)
WBC: 6.1 10*3/uL (ref 4.0–10.5)
nRBC: 0 % (ref 0.0–0.2)

## 2022-09-11 LAB — RETICULOCYTES
Immature Retic Fract: 25.9 % — ABNORMAL HIGH (ref 2.3–15.9)
RBC.: 3.33 MIL/uL — ABNORMAL LOW (ref 4.22–5.81)
Retic Count, Absolute: 87.6 10*3/uL (ref 19.0–186.0)
Retic Ct Pct: 2.6 % (ref 0.4–3.1)

## 2022-09-11 LAB — VITAMIN B12: Vitamin B-12: 250 pg/mL (ref 180–914)

## 2022-09-11 LAB — FERRITIN: Ferritin: 18 ng/mL — ABNORMAL LOW (ref 24–336)

## 2022-09-11 LAB — PROCALCITONIN: Procalcitonin: <0.1 ng/mL

## 2022-09-11 LAB — IRON AND TIBC
Iron: 24 ug/dL — ABNORMAL LOW (ref 45–182)
Saturation Ratios: 7 % — ABNORMAL LOW (ref 17.9–39.5)
TIBC: 358 ug/dL (ref 250–450)
UIBC: 334 ug/dL

## 2022-09-11 LAB — FOLATE: Folate: 22 ng/mL (ref >5.9)

## 2022-09-11 MED ORDER — SODIUM CHLORIDE 0.9 % IV SOLN
200.0000 mg | Freq: Once | INTRAVENOUS | Status: AC
  Administered 2022-09-11: 200 mg via INTRAVENOUS
  Filled 2022-09-11: qty 200

## 2022-09-11 NOTE — Progress Notes (Signed)
Physical Therapy Treatment Patient Details Name: Christopher Mason MRN: [id number] DOB: [birth year] Today's Date: 09/11/2022   History of Present Illness Christopher Mason is a 61yoM withPMH: COPD, schizophrenia, spinal surgery, hypoTSH, BPD, tardive dyskinesia, who presents to the ED after falls.  MD assessment includes: COPD exacerbation, CAP, AKI, and LLE weakness. MRI impression includes: multilevel lumbar spondylosis, worst at L3-4, where a left subarticular disc extrusion results in compression of the traversing left L4 nerve root in the left lateral recess. New PT orders received 5/16 s/p left L3-4 microdiscectomy 5/15.    PT Comments    Agreeable to participation with session, but continues with significant limitation to upright positioning/mobility efforts due to L LE pain with unsupported sitting.  Tolerates sitting position no greater than 30-45 seconds, requiring return to supine for recovery and pain control.   Anticipate heavy +2 for progressive mobility efforts  Of note, Reviewed/returned demonstration of incentive spirometry for pulmonary hygiene; cuing for slow, controlled inhalation.  Sats 87-88% throughout session on 3L; increased to 4L for sats 90% end of session.  RN informed/aware.     Recommendations for follow up therapy are one component of a multi-disciplinary discharge planning process, led by the attending physician.  Recommendations may be updated based on patient status, additional functional criteria and insurance authorization.  Follow Up Recommendations  Can patient physically be transported by private vehicle: No    Assistance Recommended at Discharge Frequent or constant Supervision/Assistance  Patient can return home with the following A lot of help with bathing/dressing/bathroom;Assistance with cooking/housework;Direct supervision/assist for medications management;Assist for transportation;Help with stairs or ramp for entrance;Two people to help with walking  and/or transfers   Equipment Recommendations  Rolling walker (2 wheels);BSC/3in1;Wheelchair (measurements PT);Wheelchair cushion (measurements PT);Hospital bed    Recommendations for Other Services       Precautions / Restrictions Precautions Precautions: Fall;Back Required Braces or Orthoses: Knee Immobilizer - Left Knee Immobilizer - Left: On when out of bed or walking Restrictions Weight Bearing Restrictions: No Other Position/Activity Restrictions: OK to use LLE KI as needed to the LLE     Mobility  Bed Mobility Overal bed mobility: Needs Assistance Bed Mobility: Supine to Sit, Sit to Supine   Sidelying to sit: Min assist Supine to sit: Min assist     General bed mobility comments: heavy use of bedrails to assist with truncal elevation; unsupported sitting balance, min/mod assist. Unable to maintain >30-45 seconds due to pain in L LE necessitating return to supine for pain control    Transfers                   General transfer comment: unsafe/unable to tolerate due to pain    Ambulation/Gait               General Gait Details: unsafe/unable to tolerate due to pain   Stairs             Wheelchair Mobility    Modified Rankin (Stroke Patients Only)       Balance Overall balance assessment: Needs assistance Sitting-balance support: Feet supported, No upper extremity supported Sitting balance-Leahy Scale: Poor Sitting balance - Comments: requires UE support; unable to release UE support due to pain in back. Attempted weight shifting to unweight L LE, unsuccessful                                    Cognition Arousal/Alertness: Awake/alert Behavior  During Therapy: WFL for tasks assessed/performed Overall Cognitive Status: Within Functional Limits for tasks assessed                                          Exercises Other Exercises Other Exercises: Supine LE therex, 1x10, active ROM: ankle pumps, heel  slides, hip abduct/adduct and SLR.  Significant L quad lag with SLR attempts; act assist from therapist to complete on L Other Exercises: Reviewed/returned demonstration of incentive spirometry for pulmonary hygiene; cuing for slow, controlled inhalation.  Sats 87-88% throughout session on 3L; increased to 4L for sats 90% end of session.  RN informed/aware.    General Comments        Pertinent Vitals/Pain Pain Assessment Pain Assessment: Faces Faces Pain Scale: Hurts whole lot Pain Location: L LE/thigh Pain Descriptors / Indicators: Sore, Aching, Grimacing, Moaning, Shooting Pain Intervention(s): Limited activity within patient's tolerance, Monitored during session, Repositioned, Premedicated before session    Home Living                          Prior Function            PT Goals (current goals can now be found in the care plan section) Acute Rehab PT Goals Patient Stated Goal: To walk better PT Goal Formulation: With patient Time For Goal Achievement: 09/21/22 Potential to Achieve Goals: Good Progress towards PT goals: Not progressing toward goals - comment    Frequency    Min 3X/week      PT Plan Current plan remains appropriate    Co-evaluation              AM-PAC PT "6 Clicks" Mobility   Outcome Measure  Help needed turning from your back to your side while in a flat bed without using bedrails?: A Lot Help needed moving from lying on your back to sitting on the side of a flat bed without using bedrails?: A Lot Help needed moving to and from a bed to a chair (including a wheelchair)?: Total Help needed standing up from a chair using your arms (e.g., wheelchair or bedside chair)?: Total Help needed to walk in hospital room?: Total Help needed climbing 3-5 steps with a railing? : Total 6 Click Score: 8    End of Session Equipment Utilized During Treatment: Oxygen Activity Tolerance: Patient limited by pain Patient left: with call bell/phone  within reach;in bed;with bed alarm set Nurse Communication: Mobility status PT Visit Diagnosis: Muscle weakness (generalized) (M62.81);Difficulty in walking, not elsewhere classified (R26.2) Pain - Right/Left: Left Pain - part of body: Leg     Time: 4098-1191 PT Time Calculation (min) (ACUTE ONLY): 25 min  Charges:  $Therapeutic Exercise: 8-22 mins $Therapeutic Activity: 8-22 mins                    Kaliya Shreiner H. Manson Passey, PT, DPT, NCS 09/11/22, 4:53 PM [phone]

## 2022-09-11 NOTE — Progress Notes (Signed)
Progress Note   Patient: Christopher Mason ZOX:[id number] DOB: 09/14/1960 DOA: 08/16/2022     25 DOS: the patient was seen and examined on 09/11/2022   Brief hospital course:  62 y.o. male with medical history significant of COPD, schizophrenia, multiple previous spinal surgery, hypothyroidism, bipolar disorder, tardive dyskinesia, who presented to the ED with complaints of a fall. He notes that his left leg weakness and numbness started as left hip pain a few days ago. He has had multiple falls due to the weakness. He denies any other focal weakness. He endorsed increased cough over the last few days in addition to increased shortness of breath.  Patient had steroid injection on 4/26.  5/8.  Still having a lot of back pain.  Does not remember a steroid injection.  Physical therapy recommending rehab but patient does not want to sign over monthly check. 5/9.  Patient able to come down to 2 L of oxygen.  Still having weakness in his left leg. 5/10.  ABG showing a pO2 of 121. Patient remained in the hospital , weaning of oxygen.  Complaining of ongoing pain. Abdominal distention and constipation improved. 5/15, underwent microdiscectomy with some clinical improvement. 5/17, needs skilled nursing facility. 5/18-19 - O2 needs rising again, not wheezing 5/20 - IV iron infusion, O2 down 5 >> 3 L/min today   Assessment and Plan: Lumbar radiculopathy, acute Patient with disc herniation L3/4.  Patient had steroid injection by interventional radiology on 4/26.   No improvement in weeks after injection, still unable to ambulate.   Patient underwent microdiscectomy 5/15.   Work with PT OT.  Refer to SNF. Pain management with oral pain medication along with bowel regimen. Neurosurgery removed drain today (5/19)  Left leg weakness Secondary to disc herniation, continue to work with PT OT.  Acute respiratory failure with hypoxia (HCC) Does have history of COPD.   Continue bronchodilator therapy.    Keep on oxygen to keep saturation more than 90%. 5/19 - repeat chest xray given rising O2 needs 4 >> 5 l/min (had been weaned to 2 l/min)  Iron deficiency anemia -- iron 24, ferritin 18.  IV iron infusion today (5/20), will give 200 mg Venofer (in 100 mL to minimize volume) Repeat IV iron 5/22 Monitor CBC  CAP (community acquired pneumonia) Treated during hospital course.  Completed antibiotic therapy.  COPD exacerbation (HCC) Completed steroids.  Continue nebulizer treatments.   Seen by pulmonology pre-operatively.  AKI (acute kidney injury) (HCC) Creatinine 1.79 on presentation and down to 0.91.  Obesity (BMI 30-39.9) BMI 38.78 Complicates overall care and prognosis.  Recommend lifestyle modifications including physical activity and diet for weight loss and overall long-term health.  Constipation X-ray showing constipation. Continue MiraLAX.  Continue aggressive bowel regimen to avoid postop complications.  Normocytic anemia - check anemia panel with AM labs  Anasarca Switched Lasix to oral, on 20 mg BID.   Echocardiogram shows normal EF.   Clinically improved.  Fatigue Increased levothyroxine with TSH being elevated above 14.  Free T4 is normal.  Schizophrenia (HCC) - Continue home amitriptyline, Invega and venlafaxine  HTN (hypertension) Blood pressure stable.  Continue propranolol and amlodipine    Patient is stable to transfer to SNF when bed available.        Subjective: Patient awake resting in bed this AM.  Denies acute complaints other than feeling very tired. Denies dyspnea at rest, but is SOB with any exertion.  This feels about same.   Physical Exam: Vitals:  09/10/22 2006 09/10/22 2300 09/11/22 0735 09/11/22 0745  BP:  132/81 (!) 138/92   Pulse:  90 82   Resp:  20 20   Temp:  98 F (36.7 C) 98.9 F (37.2 C)   TempSrc:  Oral Oral   SpO2: 93% 96% 92% 97%  Weight:      Height:       General exam: awake, alert, no acute distress, obese,  chronically ill appearing HEENT: nasal cannula in place, moist mucus membranes, hearing grossly normal  Respiratory system: lungs generally diminished, no wheezes of rhonchi, diminished bases, normal respiratory effort at rest. On 3 L/min [place] O2 (down from 5 L/min yesterday) Cardiovascular system: normal S1/S2, RRR, no pedal edema.   Central nervous system: A&O x 3. no gross focal neurologic deficits, normal speech Extremities: no edema, normal tone Skin: dry, intact, normal temperature Psychiatry: normal mood, congruent affect     Data Reviewed: Notable labs -- iron 24, sat ratios 7%, ferritin 18, normal B12, procal < 0.10, Hbg improved 9.6, immature retci fraction 25.9% elevated   Family Communication: None.  Disposition: Status is: Inpatient Remains inpatient appropriate because: Unsafe discharge disposition plan.  Planned Discharge Destination: Skilled nursing facility.    Time spent: 35 minutes.  Author: Pennie Banter, DO 09/11/2022 1:50 PM  For on call review [email or web address].

## 2022-09-12 DIAGNOSIS — J449 Chronic obstructive pulmonary disease, unspecified: Secondary | ICD-10-CM | POA: Diagnosis not present

## 2022-09-12 DIAGNOSIS — M5416 Radiculopathy, lumbar region: Secondary | ICD-10-CM | POA: Diagnosis not present

## 2022-09-12 DIAGNOSIS — J9601 Acute respiratory failure with hypoxia: Secondary | ICD-10-CM | POA: Diagnosis not present

## 2022-09-12 DIAGNOSIS — J9811 Atelectasis: Secondary | ICD-10-CM | POA: Diagnosis not present

## 2022-09-12 MED ORDER — ARFORMOTEROL TARTRATE 15 MCG/2ML IN NEBU
15.0000 ug | INHALATION_SOLUTION | Freq: Two times a day (BID) | RESPIRATORY_TRACT | Status: DC
  Administered 2022-09-12 – 2022-09-20 (×16): 15 ug via RESPIRATORY_TRACT
  Filled 2022-09-12 (×17): qty 2

## 2022-09-12 MED ORDER — REVEFENACIN 175 MCG/3ML IN SOLN
175.0000 ug | Freq: Every day | RESPIRATORY_TRACT | Status: DC
  Administered 2022-09-12 – 2022-09-20 (×9): 175 ug via RESPIRATORY_TRACT
  Filled 2022-09-12 (×9): qty 3

## 2022-09-12 MED ORDER — GABAPENTIN 100 MG PO CAPS
100.0000 mg | ORAL_CAPSULE | Freq: Three times a day (TID) | ORAL | Status: DC
  Administered 2022-09-12 – 2022-09-20 (×25): 100 mg via ORAL
  Filled 2022-09-12 (×25): qty 1

## 2022-09-12 NOTE — Progress Notes (Signed)
Physical Therapy Treatment Patient Details Name: Christopher Mason MRN: [id number] DOB: June 11, 1960 Today's Date: 09/12/2022   History of Present Illness Zerick Bencivenga is a 61yoM withPMH: COPD, schizophrenia, spinal surgery, hypoTSH, BPD, tardive dyskinesia, who presents to the ED after falls.  MD assessment includes: COPD exacerbation, CAP, AKI, and LLE weakness. MRI impression includes: multilevel lumbar spondylosis, worst at L3-4, where a left subarticular disc extrusion results in compression of the traversing left L4 nerve root in the left lateral recess. New PT orders received 5/16 s/p left L3-4 microdiscectomy 5/15.    PT Comments    Slight increase in tolerance for unsupported sitting today; able to release UE support and tolerate for slightly longer duration. Does ultimately spontaneously return to self to supine again, but agreeable to chair position in bed with incorporation of core therex to further promote core activation, forward trunk lean and tolerance to upright.  Pain to L thigh remains significant barrier to mobility progression, rated 10/10 with attempts at sitting (described as burning, "feels like somebody has a hot flame over it").  RN informed/aware.   Recommendations for follow up therapy are one component of a multi-disciplinary discharge planning process, led by the attending physician.  Recommendations may be updated based on patient status, additional functional criteria and insurance authorization.  Follow Up Recommendations  Can patient physically be transported by private vehicle: No    Assistance Recommended at Discharge    Patient can return home with the following A lot of help with bathing/dressing/bathroom;Assistance with cooking/housework;Direct supervision/assist for medications management;Assist for transportation;Help with stairs or ramp for entrance;Two people to help with walking and/or transfers   Equipment Recommendations  Rolling walker (2  wheels);BSC/3in1;Wheelchair (measurements PT);Wheelchair cushion (measurements PT);Hospital bed    Recommendations for Other Services       Precautions / Restrictions Precautions Precautions: Fall;Back Required Braces or Orthoses: Knee Immobilizer - Left Knee Immobilizer - Left: On when out of bed or walking Restrictions Weight Bearing Restrictions: No Other Position/Activity Restrictions: OK to use LLE KI as needed to the LLE     Mobility  Bed Mobility Overal bed mobility: Needs Assistance Bed Mobility: Supine to Sit, Sit to Supine, Rolling Rolling: Min guard Sidelying to sit: Mod assist Supine to sit: Min assist          Transfers                   General transfer comment: unsafe/unable to tolerate due to pain    Ambulation/Gait               General Gait Details: unsafe/unable to tolerate due to pain   Stairs             Wheelchair Mobility    Modified Rankin (Stroke Patients Only)       Balance Overall balance assessment: Needs assistance Sitting-balance support: No upper extremity supported, Feet supported Sitting balance-Leahy Scale: Fair Sitting balance - Comments: able to release UE support and maintain static sitting balance with cga/close sup today.  Again, spontaneously returns self to supine, but tolerates position for slightly longer time today                                    Cognition Arousal/Alertness: Awake/alert Behavior During Therapy: WFL for tasks assessed/performed Overall Cognitive Status: Within Functional Limits for tasks assessed  General Comments: anxious, fearful, pain-avoidant; easily agitated, but redirectable        Exercises Other Exercises Other Exercises: Transitioned to chair position in bed (approx 55 degrees of truncal elevation) for progressive tolerance to upright; max cuing/encouragement for positioning. Other Exercises:  Participated with dynamic reaching activities in chair position to promote abdominal engagement, forward trunk lean/weight shift; max cuing/encouragement Other Exercises: Rolling bilat, cga/close sup; heavy use of bedrails.  Bridging x3; unable to fully clear buttocks, but fair activation of L quads/hams    General Comments        Pertinent Vitals/Pain Pain Assessment Pain Assessment: Faces Faces Pain Scale: Hurts whole lot Pain Location: L LE/thigh Pain Descriptors / Indicators: Sore, Aching, Grimacing, Moaning, Shooting Pain Intervention(s): Limited activity within patient's tolerance, Monitored during session, Repositioned, Premedicated before session    Home Living                          Prior Function            PT Goals (current goals can now be found in the care plan section) Acute Rehab PT Goals Patient Stated Goal: To walk better PT Goal Formulation: With patient Time For Goal Achievement: 09/21/22 Potential to Achieve Goals: Good Progress towards PT goals: Not progressing toward goals - comment    Frequency    Min 3X/week      PT Plan Current plan remains appropriate    Co-evaluation              AM-PAC PT "6 Clicks" Mobility   Outcome Measure  Help needed turning from your back to your side while in a flat bed without using bedrails?: A Lot Help needed moving from lying on your back to sitting on the side of a flat bed without using bedrails?: A Lot Help needed moving to and from a bed to a chair (including a wheelchair)?: Total Help needed standing up from a chair using your arms (e.g., wheelchair or bedside chair)?: Total Help needed to walk in hospital room?: Total Help needed climbing 3-5 steps with a railing? : Total 6 Click Score: 8    End of Session Equipment Utilized During Treatment: Oxygen Activity Tolerance: Patient limited by pain Patient left: with call bell/phone within reach;in bed;with bed alarm set Nurse  Communication: Mobility status (chair position in bed; goal to maintain until 1530) PT Visit Diagnosis: Muscle weakness (generalized) (M62.81);Difficulty in walking, not elsewhere classified (R26.2) Pain - Right/Left: Left Pain - part of body: Leg     Time: 1610-9604 PT Time Calculation (min) (ACUTE ONLY): 31 min  Charges:  $Therapeutic Activity: 23-37 mins                     Jamelle Noy H. Manson Passey, PT, DPT, NCS 09/12/22, 3:32 PM [phone]

## 2022-09-12 NOTE — Plan of Care (Signed)
  Problem: Education: Goal: Knowledge of General Education information will improve Description: Including pain rating scale, medication(s)/side effects and non-pharmacologic comfort measures Outcome: Progressing   Problem: Health Behavior/Discharge Planning: Goal: Ability to manage health-related needs will improve Outcome: Progressing   Problem: Clinical Measurements: Goal: Ability to maintain clinical measurements within normal limits will improve Outcome: Progressing Goal: Will remain free from infection Outcome: Progressing Goal: Cardiovascular complication will be avoided Outcome: Progressing   Problem: Activity: Goal: Risk for activity intolerance will decrease Outcome: Progressing   Problem: Nutrition: Goal: Adequate nutrition will be maintained Outcome: Progressing   Problem: Elimination: Goal: Will not experience complications related to bowel motility Outcome: Progressing   

## 2022-09-12 NOTE — Progress Notes (Signed)
NAME:  Christopher Mason, MRN:  [id number], DOB:  28-Mar-1961, LOS: 26 ADMISSION DATE:  08/16/2022, CONSULTATION DATE: 05 Sep 2022 REFERRING MD: Alford Highland, MD, CHIEF COMPLAINT: Assess respiratory status prior to surgery  History of Present Illness:  Christopher Mason is a 62 year old smoker with a 50 pack year history and a medical history as noted below, who we are evaluating today for assessment of his respiratory status prior to surgery.  He is well-known to [facility] from clinic follow-up there for COPD.  His baseline FEV1 is 1.59 L or 50% of predicted with an FVC of 2.97 L or 70% predicted and FEV1/FVC of 54% consistent with moderate to severe obstructive defect.  This according to the most recent PFTs on December 2023.  Did not have bronchodilator response at that time.  Patient is not a very good historian he has underlying issues with schizophrenia and sometimes has flight of ideas during interview.  In any event he was admitted 20 days ago to [facility] due to weakness of the lower extremities and increasing shortness of breath.  He noted left lower extremity weakness and numbness and fell on the day of admission which was 24 April.  Been having multiple falls at home due to this.  Prior to admission he had increased cough over his baseline.  He has been pretty much bed bound since he has been in the hospital.  He was noted to have significant lumbar radiculopathy and disc herniation of L3-L4.  Steroid injection by interventional radiology was performed on 26 April but this has not made matters any better.  Neurosurgery needs to do a microdiscectomy to decompress nerve impingement.  He has been maintained on DuoNeb and Pulmicort via nebulizer during this hospitalization.  He has required oxygen due to marginal oxygen saturations.  Chest x-rays have shown significant atelectasis and this is likely due to his bedbound status.  At the time of my interview with him he was laying flat in bed  comfortable.  Had no issues with cough or wheezing.  No chest pain.  No tachypalpitations.  Pertinent  Medical History  COPD moderate to severe Tobacco/marijuana abuse Schizophrenia Lumbar radiculopathy  Significant Hospital Events: Including procedures, antibiotic start and stop dates in addition to other pertinent events   05/15: S/P micro discectomy L3/L4, under GA, no resp complic 05/16: Able to sit up in chair feeling better 05/17: Respiratory wise, feels he is at baseline 05/18: O2 needs increasing again 05/21: To back down to 3 L/min  Interim History / Subjective:  His main complaint is that of pain in his legs.  He spends the majority of his time laying flat in bed which in turn leads to atelectasis.  Not using incentive spirometer.  Not using Acapella.  Respiratory medications have been changed from what was recommended previously.  Objective   Blood pressure (!) 123/90, pulse 88, temperature 98 F (36.7 C), resp. rate 16, height 5\' 7"  (1.702 m), weight 117.4 kg, SpO2 93 %.    SpO2: 93 % O2 Flow Rate (L/min): 3 L/min FiO2 (%): 28 %   Intake/Output Summary (Last 24 hours) at 09/12/2022 1647 Last data filed at 09/12/2022 1621 Gross per 24 hour  Intake 0 ml  Output 2200 ml  Net -2200 ml    Filed Weights   09/07/22 0500 09/10/22 0616 09/12/22 0718  Weight: 112.9 kg 112.7 kg 117.4 kg    Examination: GENERAL: Obese gentleman, no acute distress.  Laying in bed, comfortable nasal cannula  O2.  No conversational dyspnea. HEAD: Normocephalic, atraumatic.  EYES: Pupils equal, round, reactive to light.  No scleral icterus.  MOUTH: Macroglossia, lingual dyskinesia. NECK: Supple. No thyromegaly. Trachea midline. No JVD.  No adenopathy. PULMONARY: Good air entry bilaterally.  Rhonchi noted throughout. CARDIOVASCULAR: S1 and S2. Regular rate and rhythm.  ABDOMEN: Significant truncal obesity. MUSCULOSKELETAL: Left arm limited motion due to prior scar tissue (burn), no clubbing,  no edema. Decreased muscle mass on the left lower extremity due to prior poliomyelitis  NEUROLOGIC: Patient exhibits lingual tardive dyskinesia, dysarthria due to the same. SKIN: Intact,warm,dry. PSYCH: Normal mood.  Normal behavior.  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 Appears fairly compensated at present Tolerated GA without difficulty Needs mobilization Optimize respiratory medications Do not use systemic steroids unless patient exhibits wheezing Pulmonary hygiene: Acapella, IS  Acute respiratory failure with hypoxia Atelectasis, bibasilar Atelectasis likely due to being bedbound Tolerated discectomy Out of bed protocol initiated, having difficulty participating Continue pulmonary hygiene Resume incentive spirometry every 2 hours while awake Add Acapella Wean O2 off as tolerated for sats of 90% or better   Labs   CBC: Recent Labs  Lab 09/10/22 0442 09/11/22 0457  WBC 7.2 6.1  HGB 9.2* 9.6*  HCT 30.7* 31.4*  MCV 94.5 94.0  PLT 232 252     Basic Metabolic Panel: Recent Labs  Lab 09/10/22 0442  NA 133*  K 4.6  CL 99  CO2 30  GLUCOSE 122*  BUN 19  CREATININE 0.83  CALCIUM 8.7*    GFR: Estimated Creatinine Clearance: 113 mL/min (by C-G formula based on SCr of 0.83 mg/dL). Recent Labs  Lab 09/10/22 0442 09/11/22 0454 09/11/22 0457  PROCALCITON  --  <0.10  --   WBC 7.2  --  6.1     Liver Function Tests: No results for input(s): "AST", "ALT", "ALKPHOS", "BILITOT", "PROT", "ALBUMIN" in the last 168 hours. No results for input(s): "LIPASE", "AMYLASE" in the last 168 hours. No results for input(s): "AMMONIA" in the last 168 hours.  ABG    Component Value Date/Time   PHART 7.5 (H) 09/01/2022 1052   PCO2ART 42 09/01/2022 1052   PO2ART 121 (H) 09/01/2022 1052   HCO3 32.8 (H) 09/01/2022 1052   O2SAT 99.1 09/01/2022 1052     Coagulation Profile: No results for input(s): "INR", "PROTIME" in the last  168 hours.   Cardiac Enzymes: No results for input(s): "CKTOTAL", "CKMB", "CKMBINDEX", "TROPONINI" in the last 168 hours.  HbA1C: Hgb A1c MFr Bld  Date/Time Value Ref Range Status  07/04/2017 12:19 PM 5.4 4.8 - 5.6 % Final    Comment:    (NOTE) Pre diabetes:          5.7%-6.4% Diabetes:              >6.4% Glycemic control for   <7.0% adults with diabetes   11/20/2014 02:43 AM 5.3 4.0 - 6.0 % Final    CBG: No results for input(s): "GLUCAP" in the last 168 hours.  Review of Systems:   A 10 point review of systems was performed and it is as noted above otherwise negative.  Allergies Allergies  Allergen Reactions   Aspirin Nausea And Vomiting and Swelling    Home Medications  Prior to Admission medications   Medication Sig Start Date End Date Taking? Authorizing Provider  albuterol (VENTOLIN HFA) 108 (90 Base) MCG/ACT inhaler Inhale 2 puffs into the lungs every 6 (six) hours as  needed for wheezing or shortness of breath. 03/01/22  Yes Abernathy, Arlyss Repress, NP  amitriptyline (ELAVIL) 50 MG tablet Take 1 tablet (50 mg total) by mouth at bedtime. 07/23/17  Yes McNew, Ileene Hutchinson, MD  amLODipine (NORVASC) 10 MG tablet TAKE 1 TABLET (10 MG TOTAL) BY MOUTH DAILY. 03/23/22  Yes Abernathy, Arlyss Repress, NP  atorvastatin (LIPITOR) 20 MG tablet Take 1 tablet (20 mg total) by mouth daily. 04/14/22  Yes O'Neal, Ronnald Ramp, MD  docusate sodium (COLACE) 100 MG capsule TAKE 1 CAPSULE (100 MG TOTAL) BY MOUTH 2 (TWO) TIMES DAILY. 03/23/22  Yes Abernathy, Arlyss Repress, NP  folic acid (FOLVITE) 1 MG tablet TAKE 1 TABLET (1 MG TOTAL) BY MOUTH DAILY. 07/13/22 10/11/22 Yes Abernathy, Arlyss Repress, NP  INVEGA SUSTENNA 234 MG/1.5ML injection Inject into the muscle. Every 4 weeks. 01/26/22  Yes [provider]  ipratropium-albuterol (DUONEB) 0.5-2.5 (3) MG/3ML SOLN Take 3 mLs by nebulization every 6 (six) hours as needed. 05/01/22  Yes Abernathy, Arlyss Repress, NP  levothyroxine (SYNTHROID) 137 MCG tablet TAKE 1 TABLET (137 MCG  TOTAL) BY MOUTH DAILY BEFORE BREAKFAST. 07/13/22  Yes Abernathy, Alyssa, NP  lisinopril (ZESTRIL) 40 MG tablet TAKE 1 TABLET (40 MG TOTAL) BY MOUTH DAILY. 03/23/22  Yes Abernathy, Arlyss Repress, NP  meloxicam (MOBIC) 15 MG tablet Take 1 tablet (15 mg total) by mouth daily. In am with breakfast 07/06/22  Yes Abernathy, Alyssa, NP  methocarbamol (ROBAXIN) 750 MG tablet Take 1 tablet (750 mg total) by mouth every 8 (eight) hours as needed for muscle spasms. 07/06/22  Yes Abernathy, Alyssa, NP  nicotine (NICODERM CQ - DOSED IN MG/24 HOURS) 21 mg/24hr patch PLACE 1 PATCH (21 MG TOTAL) ONTO THE SKIN DAILY. 07/13/22  Yes Abernathy, Arlyss Repress, NP  pantoprazole (PROTONIX) 40 MG tablet Take 1 tablet (40 mg total) by mouth daily. 04/25/22  Yes Abernathy, Arlyss Repress, NP  propranolol (INDERAL) 10 MG tablet TAKE 1 TABLET (10 MG TOTAL) BY MOUTH 3 (THREE) TIMES DAILY. 07/13/22  Yes Abernathy, Arlyss Repress, NP  tamsulosin (FLOMAX) 0.4 MG CAPS capsule TAKE ONE CAPSULE BY MOUTH EVERY DAY 02/17/21  Yes Stoioff, Verna Czech, MD  traZODone (DESYREL) 100 MG tablet Take 100 mg by mouth at bedtime.   Yes [provider]  venlafaxine XR (EFFEXOR-XR) 75 MG 24 hr capsule Take 1 capsule (75 mg total) by mouth daily with breakfast. 07/24/17  Yes McNew, Ileene Hutchinson, MD  Vitamin D, Ergocalciferol, (DRISDOL) 1.25 MG (50000 UNIT) CAPS capsule TAKE 1 CAPSULE (50,000 UNITS TOTAL) BY MOUTH ONCE A WEEK. { 04/25/22  Yes Abernathy, Alyssa, NP  solifenacin (VESICARE) 10 MG tablet Take 10 mg by mouth daily.    [provider]   Scheduled Meds:  amitriptyline  25 mg Oral QHS   amLODipine  10 mg Oral Daily   budesonide (PULMICORT) nebulizer solution  0.25 mg Nebulization BID   enoxaparin (LOVENOX) injection  0.5 mg/kg Subcutaneous Q24H   folic acid  1 mg Oral Daily   furosemide  20 mg Oral BID   gabapentin  100 mg Oral TID   ipratropium-albuterol  3 mL Nebulization BID   levothyroxine  150 mcg Oral QAC breakfast   multivitamin with minerals  1 tablet Oral  Daily   nicotine  21 mg Transdermal Daily   paliperidone  234 mg Intramuscular Q28 days   pantoprazole  40 mg Oral Daily   polyethylene glycol  34 g Oral BID   propranolol  10 mg Oral TID   sodium chloride flush  3 mL Intravenous Q12H  tamsulosin  0.4 mg Oral Daily   thiamine  100 mg Oral Daily   traZODone  100 mg Oral QHS   venlafaxine XR  75 mg Oral Q breakfast   Continuous Infusions: PRN Meds:.Reviewed    Level 2 follow-up    Discussed with Dr. Esaw Grandchild.  Gailen Shelter, MD Advanced Bronchoscopy PCCM [facility]    *This note was dictated using voice recognition software/Dragon.  Despite best efforts to proofread, errors can occur which can change the meaning. Any transcriptional errors that result from this process are unintentional and may not be fully corrected at the time of dictation.

## 2022-09-12 NOTE — Progress Notes (Signed)
Progress Note   Patient: Christopher Mason ZOX:[id number] DOB: [birth year] DOA: 08/16/2022     26 DOS: the patient was seen and examined on 09/12/2022   Brief hospital course:  62 y.o. male with medical history significant of COPD, schizophrenia, multiple previous spinal surgery, hypothyroidism, bipolar disorder, tardive dyskinesia, who presented to the ED with complaints of a fall. He notes that his left leg weakness and numbness started as left hip pain a few days ago. He has had multiple falls due to the weakness. He denies any other focal weakness. He endorsed increased cough over the last few days in addition to increased shortness of breath.  Patient had steroid injection on 4/26.  5/8.  Still having a lot of back pain.  Does not remember a steroid injection.  Physical therapy recommending rehab but patient does not want to sign over monthly check. 5/9.  Patient able to come down to 2 L of oxygen.  Still having weakness in his left leg. 5/10.  ABG showing a pO2 of 121. Patient remained in the hospital , weaning of oxygen.  Complaining of ongoing pain. Abdominal distention and constipation improved. 5/15, underwent microdiscectomy with some clinical improvement. 5/17, needs skilled nursing facility. 5/18-19 - O2 needs rising again, not wheezing 5/20 - IV iron infusion, O2 down 5 >> 3 L/min today 5/21 - started low dose gabapentin for persistent LLE radicular pain, not tolerating OOB activity   Assessment and Plan: Lumbar radiculopathy, acute Patient with disc herniation L3/4.  Patient had steroid injection by interventional radiology on 4/26.   No improvement in weeks after injection, still unable to ambulate.   Patient underwent microdiscectomy 5/15.   Work with PT OT.  Refer to SNF. Pain management with oral pain medication along with bowel regimen. Neurosurgery removed drain (5/19) Start trial low dose gabapentin 100 mg TID (5/21).  Monitor for efficacy and side effects, adjust  dose accordingly.  Left leg weakness Secondary to disc herniation, continue to work with PT OT.  Acute respiratory failure with hypoxia (HCC) Does have history of COPD.   Continue bronchodilator therapy.   Keep on oxygen to keep saturation more than 90%. 5/19 - repeat chest xray given rising O2 needs 4 >> 5 l/min (had been weaned to 2 l/min)  Iron deficiency anemia -- iron 24, ferritin 18.  IV iron infusion today (5/20), will give 200 mg Venofer (in 100 mL to minimize volume) Repeat IV iron 5/22 Monitor CBC  CAP (community acquired pneumonia) Treated during hospital course.  Completed antibiotic therapy.  COPD exacerbation (HCC) Completed steroids.  Continue nebulizer treatments.   Seen by pulmonology pre-operatively.  AKI (acute kidney injury) (HCC) Creatinine 1.79 on presentation and down to 0.91.  Obesity (BMI 30-39.9) BMI 38.78 Complicates overall care and prognosis.  Recommend lifestyle modifications including physical activity and diet for weight loss and overall long-term health.  Constipation X-ray showing constipation. Continue MiraLAX.  Continue aggressive bowel regimen to avoid postop complications.  Normocytic anemia - check anemia panel with AM labs  Anasarca Switched Lasix to oral, on 20 mg BID.   Echocardiogram shows normal EF.   Clinically improved.  Fatigue Increased levothyroxine with TSH being elevated above 14.  Free T4 is normal.  Schizophrenia (HCC) - Continue home amitriptyline, Invega and venlafaxine  HTN (hypertension) Blood pressure stable.  Continue propranolol and amlodipine    Patient is stable to transfer to SNF when bed available.        Subjective: Patient awake resting in bed  this AM. PT just finished working with him and state he has not tolerating attempting out of bed activity in several days.  He complains of ongoing severe left thigh pain that wraps around from hip, anteriorly toward the inner thigh.  States it is  constant and severe, aggravated by any attempt at movement.     Physical Exam: Vitals:   09/11/22 1547 09/11/22 2002 09/11/22 2116 09/12/22 0722  BP: 133/76  113/81   Pulse: 81  79   Resp:   18   Temp:   98.2 F (36.8 C)   TempSrc:      SpO2: 93% 92% 91% 91%  Weight:      Height:       General exam: awake, alert, no acute distress, obese, chronically ill appearing HEENT: nasal cannula in place, moist mucus membranes, hearing grossly normal  Respiratory system: lungs generally diminished, no wheezes of rhonchi, diminished bases, normal respiratory effort at rest. On 3 L/min [place] O2 Cardiovascular system: normal S1/S2, RRR, no pedal edema.   Central nervous system: A&O x 3. no gross focal neurologic deficits, normal speech Extremities: no edema, normal tone Skin: dry, intact, normal temperature Psychiatry: normal mood, congruent affect     Data Reviewed: No new labs today  Notable recent labs --  iron 24, sat ratios 7%, ferritin 18, normal B12, procal < 0.10,  Hbg improved 9.6, immature retcici fraction 25.9% elevated   Family Communication: None.  Disposition: Status is: Inpatient Remains inpatient appropriate because: Unsafe discharge disposition plan.  Planned Discharge Destination: Skilled nursing facility.    Time spent: 35 minutes.  Author: Pennie Banter, DO 09/12/2022 2:03 PM  For on call review [email or web address].

## 2022-09-12 NOTE — [place] FL2 (Signed)
[place] MEDICAID [id number] LEVEL OF CARE FORM     IDENTIFICATION  Patient Name: Christopher Mason Birthdate: May 08, 1960 Sex: male Admission Date (Current Location): 08/16/2022  [facility] and [place] Number:  Chiropodist and Address:  [facility], [address] [place], [place] [address]      Provider Number: [id number]  Attending Physician Name and Address:  Pennie Banter, DO  Relative Name and Phone Number:  Iantha Fallen [phone]    Current Level of Care: Hospital Recommended Level of Care: Skilled Nursing Facility Prior Approval Number:    Date Approved/Denied:   PASRR Number: Pending  Discharge Plan: SNF    Current Diagnoses: Patient Active Problem List   Diagnosis Date Noted   Atelectasis of both lungs 09/08/2022   Lumbar radiculopathy 09/06/2022   Obesity (BMI 30-39.9) 09/05/2022   Constipation 09/02/2022   Acute respiratory failure with hypoxia (HCC) 08/30/2022   Anasarca 08/30/2022   Lumbar radiculopathy, acute 08/17/2022   CAP (community acquired pneumonia) 08/16/2022   Left leg weakness 08/16/2022   Adenomatous polyp of colon 04/11/2022   Genetic testing 01/12/2020   History of colon polyps    Encounter for screening colonoscopy    Erectile dysfunction due to arterial insufficiency 12/09/2018   COPD exacerbation (HCC) 07/11/2018   Abdominal pain, left lower quadrant 10/31/2017   Acute cystitis without hematuria 10/31/2017   Fatigue 10/31/2017   Cigarette nicotine dependence without complication 10/31/2017   Acute right ankle pain 10/10/2017   Chronic pain of left knee 10/10/2017   Lower extremity edema 10/10/2017   Recurrent left inguinal hernia 10/10/2017   Schizophrenia (HCC) 07/03/2017   HLD (hyperlipidemia) 02/07/2015   COPD, group C, by GOLD 2017 classification (HCC) 02/07/2015   Hyponatremia 02/07/2015   AKI (acute kidney injury) (HCC) 11/20/2014   Hypothyroidism 09/17/2014   GERD (gastroesophageal reflux  disease) 09/17/2014   Tobacco use disorder 09/17/2014   Asthma 09/16/2014   HTN (hypertension) 09/16/2014   Tardive dyskinesia 09/16/2014    Orientation RESPIRATION BLADDER Height & Weight     Self, Time, Situation, Place  Normal, O2 (2 liters) Continent Weight: 112.7 kg Height:  5\' 7"  (170.2 cm)  BEHAVIORAL SYMPTOMS/MOOD NEUROLOGICAL BOWEL NUTRITION STATUS      Continent Diet (see dc summary)  AMBULATORY STATUS COMMUNICATION OF NEEDS Skin   Extensive Assist Verbally Normal, Surgical wounds                       Personal Care Assistance Level of Assistance  Bathing, Feeding, Dressing Bathing Assistance: Maximum assistance Feeding assistance: Limited assistance Dressing Assistance: Maximum assistance     Functional Limitations Info  Sight, Hearing, Speech Sight Info: Adequate Hearing Info: Adequate Speech Info: Impaired    SPECIAL CARE FACTORS FREQUENCY  PT (By licensed PT), OT (By licensed OT)     PT Frequency: 5 times OT Frequency: 5 times per week            Contractures Contractures Info: Not present    Additional Factors Info  Code Status, Allergies Code Status Info: full code Allergies Info: aspirin           Current Medications (09/12/2022):  This is the current hospital active medication list Current Facility-Administered Medications  Medication Dose Route Frequency Provider Last Rate Last Admin   acetaminophen (TYLENOL) tablet 650 mg  650 mg Oral Q6H PRN Susanne Borders, PA   650 mg at 09/07/22 1523   Or   acetaminophen (TYLENOL) suppository  650 mg  650 mg Rectal Q6H PRN Susanne Borders, PA       albuterol (PROVENTIL) (2.5 MG/3ML) 0.083% nebulizer solution 2.5 mg  2.5 mg Inhalation Q4H PRN Susanne Borders, PA   2.5 mg at 09/05/22 1106   amitriptyline (ELAVIL) tablet 25 mg  25 mg Oral QHS Susanne Borders, [place]   25 mg at 09/11/22 2113   amLODipine (NORVASC) tablet 10 mg  10 mg Oral Daily Susanne Borders, [place]   10 mg at 09/12/22 0981    bisacodyl (DULCOLAX) suppository 10 mg  10 mg Rectal Daily PRN Susanne Borders, PA   10 mg at 09/10/22 0907   budesonide (PULMICORT) nebulizer solution 0.25 mg  0.25 mg Nebulization BID Susanne Borders, PA   0.25 mg at 09/12/22 0720   docusate sodium (COLACE) capsule 100 mg  100 mg Oral Daily PRN Susanne Borders, PA   100 mg at 08/18/22 1107   enoxaparin (LOVENOX) injection 55 mg  0.5 mg/kg Subcutaneous Q24H Susanne Borders, [place]   55 mg at 09/11/22 2113   folic acid (FOLVITE) tablet 1 mg  1 mg Oral Daily Susanne Borders, PA   1 mg at 09/12/22 1914   furosemide (LASIX) tablet 20 mg  20 mg Oral BID Susanne Borders, PA   20 mg at 09/12/22 7829   gabapentin (NEURONTIN) capsule 100 mg  100 mg Oral TID Esaw Grandchild A, DO   100 mg at 09/12/22 1047   HYDROcodone-acetaminophen (NORCO/VICODIN) 5-325 MG per tablet 1 tablet  1 tablet Oral Q6H PRN Susanne Borders, PA   1 tablet at 09/12/22 0329   ipratropium-albuterol (DUONEB) 0.5-2.5 (3) MG/3ML nebulizer solution 3 mL  3 mL Nebulization BID Dorcas Carrow, MD   3 mL at 09/12/22 0720   levothyroxine (SYNTHROID) tablet 150 mcg  150 mcg Oral QAC breakfast Susanne Borders, [place]   150 mcg at 09/12/22 5621   methocarbamol (ROBAXIN) tablet 750 mg  750 mg Oral Q8H PRN Susanne Borders, PA   750 mg at 09/12/22 3086   multivitamin with minerals tablet 1 tablet  1 tablet Oral Daily Susanne Borders, PA   1 tablet at 09/11/22 1552   nicotine (NICODERM CQ - dosed in mg/24 hours) patch 21 mg  21 mg Transdermal Daily Susanne Borders, PA   21 mg at 09/12/22 5784   ondansetron (ZOFRAN) tablet 4 mg  4 mg Oral Q6H PRN Susanne Borders, PA   4 mg at 08/17/22 2011   Or   ondansetron (ZOFRAN) injection 4 mg  4 mg Intravenous Q6H PRN Susanne Borders, PA   4 mg at 09/02/22 2029   Oral care mouth rinse  15 mL Mouth Rinse PRN Susanne Borders, PA       paliperidone [facility] SUSTENNA) injection 234 mg  234 mg Intramuscular Q28 days Clapacs, Jackquline Denmark,  MD   234 mg at 09/08/22 1509   pantoprazole (PROTONIX) EC tablet 40 mg  40 mg Oral Daily Susanne Borders, PA   40 mg at 09/12/22 6962   polyethylene glycol (MIRALAX / GLYCOLAX) packet 34 g  34 g Oral BID Susanne Borders, PA   34 g at 09/10/22 2153   propranolol (INDERAL) tablet 10 mg  10 mg Oral TID Susanne Borders, PA   10 mg at 09/12/22 0936   sodium chloride flush (NS) 0.9 % injection 3 mL  3 mL Intravenous Q12H Susanne Borders,  PA   3 mL at 09/11/22 1040   tamsulosin (FLOMAX) capsule 0.4 mg  0.4 mg Oral Daily Susanne Borders, PA   0.4 mg at 09/12/22 1610   thiamine (VITAMIN B1) tablet 100 mg  100 mg Oral Daily Susanne Borders, PA   100 mg at 09/12/22 9604   traZODone (DESYREL) tablet 100 mg  100 mg Oral QHS Susanne Borders, [place]   100 mg at 09/11/22 2113   venlafaxine XR (EFFEXOR-XR) 24 hr capsule 75 mg  75 mg Oral Q breakfast Susanne Borders, [place]   75 mg at 09/12/22 5409     Discharge Medications: Please see discharge summary for a list of discharge medications.  Relevant Imaging Results:  Relevant Lab Results:   Additional Information SS# [id number]  Marlowe Sax, RN

## 2022-09-12 NOTE — Progress Notes (Signed)
Occupational Therapy Treatment Patient Details Name: Christopher Mason MRN: [id number] DOB: 1960-06-19 Today's Date: 09/12/2022   History of present illness Dalian Smid is a 61yoM withPMH: COPD, schizophrenia, spinal surgery, hypoTSH, BPD, tardive dyskinesia, who presents to the ED after falls.  MD assessment includes: COPD exacerbation, CAP, AKI, and LLE weakness. MRI impression includes: multilevel lumbar spondylosis, worst at L3-4, where a left subarticular disc extrusion results in compression of the traversing left L4 nerve root in the left lateral recess. New PT orders received 5/16 s/p left L3-4 microdiscectomy 5/15.   OT comments  Pt seen for OT treatment on this date. Upon arrival to room pt was laying in bed attempting to eat his meal, agreeable to tx. Pt was conversing and expressed multiple stressors. Pt agreed to [facility] BLE. Pt then agreed to sitting EOB which required MIN A for L LE management. Pt reported 10/10 pain and initiated sit to supine with min guard. Pt is not making significant advancements toward goals, will continue to follow POC. Discharge recommendation remains appropriate.     Recommendations for follow up therapy are one component of a multi-disciplinary discharge planning process, led by the attending physician.  Recommendations may be updated based on patient status, additional functional criteria and insurance authorization.    Assistance Recommended at Discharge Frequent or constant Supervision/Assistance  Patient can return home with the following  A lot of help with bathing/dressing/bathroom;Assistance with cooking/housework;Assist for transportation;Help with stairs or ramp for entrance;Direct supervision/assist for medications management;A little help with walking and/or transfers   Equipment Recommendations  BSC/3in1    Recommendations for Other Services      Precautions / Restrictions Precautions Precautions: Fall;Back Required Braces or Orthoses:  Knee Immobilizer - Left Knee Immobilizer - Left: On when out of bed or walking Restrictions Weight Bearing Restrictions: No Other Position/Activity Restrictions: OK to use LLE KI as needed to the LLE       Mobility Bed Mobility Overal bed mobility: Needs Assistance Bed Mobility: Supine to Sit, Sit to Supine Rolling: Min assist   Supine to sit: Min assist Sit to supine: Min guard   General bed mobility comments: MIN A for L LE management supine to sit, patient returned self to laying position from sitting EOB demonstrating adequant core strength.    Transfers                   General transfer comment: unsafe/unable to tolerate due to pain     Balance Overall balance assessment: Needs assistance Sitting-balance support: Feet unsupported, Bilateral upper extremity supported Sitting balance-Leahy Scale: Poor Sitting balance - Comments: requires UE support; unable to release UE support due to pain in hip.                                   ADL either performed or assessed with clinical judgement   ADL Overall ADL's : Needs assistance/impaired Eating/Feeding: Cueing for safety (laying in bed attempting to feed self, but coughing was noted.)                                          Extremity/Trunk Assessment Upper Extremity Assessment Upper Extremity Assessment: Overall WFL for tasks assessed   Lower Extremity Assessment Lower Extremity Assessment: Generalized weakness        Vision  Perception     Praxis      Cognition Arousal/Alertness: Awake/alert Behavior During Therapy: WFL for tasks assessed/performed Overall Cognitive Status: Within Functional Limits for tasks assessed                                 General Comments: catastrophizing, seeking reassurance.        Exercises General Exercises - Lower Extremity Hip ABduction/ADduction: AAROM, Both, 10 reps, Supine Straight Leg Raises: AAROM,  Both, 10 reps, Supine Hip Flexion/Marching: AAROM, Both, 10 reps, Supine Toe Raises: AAROM, Both, 10 reps, Supine Heel Raises: AAROM, Both, 10 reps, Supine    Shoulder Instructions       General Comments      Pertinent Vitals/ Pain       Pain Assessment Pain Assessment: 0-10 Pain Score: 10-Worst pain ever Breathing: occasional labored breathing, short period of hyperventilation Pain Location: L LE/thigh Pain Descriptors / Indicators: Sore, Aching, Grimacing, Moaning, Shooting Pain Intervention(s): Premedicated before session, Repositioned  Home Living                                          Prior Functioning/Environment              Frequency  Min 1X/week        Progress Toward Goals  OT Goals(current goals can now be found in the care plan section)  Progress towards OT goals: Not progressing toward goals - comment (Pt is self limiting, influenced by pain.)  Acute Rehab OT Goals Patient Stated Goal: decrease pain OT Goal Formulation: With patient Time For Goal Achievement: 09/21/22 Potential to Achieve Goals: Good ADL Goals Pt Will Perform Grooming: with modified independence;standing Pt Will Perform Lower Body Dressing: with modified independence;sit to/from stand Pt Will Transfer to Toilet: with modified independence;ambulating Pt Will Perform Toileting - Clothing Manipulation and hygiene: with modified independence;sit to/from stand  Plan Discharge plan remains appropriate;Frequency remains appropriate    Co-evaluation          OT goals addressed during session: ADL's and self-care      AM-PAC OT "6 Clicks" Daily Activity     Outcome Measure   Help from another person eating meals?: None Help from another person taking care of personal grooming?: A Little Help from another person toileting, which includes using toliet, bedpan, or urinal?: A Lot Help from another person bathing (including washing, rinsing, drying)?: A  Lot Help from another person to put on and taking off regular upper body clothing?: A Little Help from another person to put on and taking off regular lower body clothing?: A Lot 6 Click Score: 16    End of Session Equipment Utilized During Treatment: Oxygen  OT Visit Diagnosis: Unsteadiness on feet (R26.81);Muscle weakness (generalized) (M62.81);History of falling (Z91.81) Pain - Right/Left: Left Pain - part of body: Leg   Activity Tolerance Patient limited by pain   Patient Left in bed;with call bell/phone within reach;with bed alarm set;with nursing/sitter in room   Nurse Communication          Time: 1610-9604 OT Time Calculation (min): 25 min  Charges: OT General Charges $OT Visit: 1 Visit OT Treatments $Therapeutic Exercise: 23-37 mins  [employer], OTS

## 2022-09-12 NOTE — TOC Progression Note (Addendum)
Transition of Care [facility]) - Progression Note    Patient Details  Name: Christopher Mason MRN: [id number] Date of Birth: January 08, 1961  Transition of Care [facility]) CM/SW Contact  Marlowe Sax, RN Phone Number: 09/12/2022, 1:26 PM  Clinical Narrative:   Met with the patient and discussed DC plan, he realizes that he is not going to be able to go home alone and function he will need more PT to get to the point, he is agreeable to going for 30 days to a SNF but only wants locally.   Explained that I would try to find one local but may not be possible, [facility] sent He does have Medicaid and is aware that he will have to sign over his Medicaid check for the month as well       Barriers to Discharge: Continued Medical Work up  Expected Discharge Plan and Services   Discharge Planning Services: CM Consult                     DME Arranged: N/A DME Agency: NA                   Social Determinants of Health (SDOH) Interventions SDOH Screenings   Food Insecurity: No Food Insecurity (08/16/2022)  Housing: Low Risk  (08/16/2022)  Transportation Needs: No Transportation Needs (08/16/2022)  Utilities: Not At Risk (08/16/2022)  Alcohol Screen: Low Risk  (09/06/2021)  Depression (PHQ2-9): Low Risk  (09/06/2021)  Tobacco Use: High Risk (09/07/2022)    Readmission Risk Interventions     No data to display

## 2022-09-12 NOTE — Plan of Care (Signed)

## 2022-09-13 ENCOUNTER — Other Ambulatory Visit: Payer: Self-pay | Admitting: Nurse Practitioner

## 2022-09-13 DIAGNOSIS — W19XXXA Unspecified fall, initial encounter: Secondary | ICD-10-CM

## 2022-09-13 DIAGNOSIS — N179 Acute kidney failure, unspecified: Secondary | ICD-10-CM | POA: Diagnosis not present

## 2022-09-13 DIAGNOSIS — J449 Chronic obstructive pulmonary disease, unspecified: Secondary | ICD-10-CM | POA: Diagnosis not present

## 2022-09-13 DIAGNOSIS — M5416 Radiculopathy, lumbar region: Secondary | ICD-10-CM | POA: Diagnosis not present

## 2022-09-13 DIAGNOSIS — J189 Pneumonia, unspecified organism: Secondary | ICD-10-CM

## 2022-09-13 DIAGNOSIS — J9811 Atelectasis: Secondary | ICD-10-CM | POA: Diagnosis not present

## 2022-09-13 DIAGNOSIS — J9601 Acute respiratory failure with hypoxia: Secondary | ICD-10-CM | POA: Diagnosis not present

## 2022-09-13 HISTORY — DX: Unspecified fall, initial encounter: W19.XXXA

## 2022-09-13 HISTORY — DX: Pneumonia, unspecified organism: J18.9

## 2022-09-13 MED ORDER — ENSURE ENLIVE PO LIQD
237.0000 mL | Freq: Two times a day (BID) | ORAL | Status: DC
  Administered 2022-09-13 – 2022-09-20 (×15): 237 mL via ORAL

## 2022-09-13 NOTE — TOC PASRR Note (Signed)
The above named patient is recommended to go to Short Term Rehab for strengthening and gait training for balance.  It is expected that the Short Term Rehab stay will be less than 30 days.  The patient is expected to return home after Rehab.  
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
## 2022-09-13 NOTE — Progress Notes (Signed)
PT Cancellation Note  Patient Details Name: Christopher Mason MRN: [id number] DOB: [birth year]   Cancelled Treatment:    Reason Eval/Treat Not Completed: Fatigue/lethargy limiting ability to participate (Twice attempted to work with pt. He is asleep and remain lethargic upon both attempt. Author hoping that coming back 2 hours later would optimize waking hours, but unsuccessful.) Will continue to follow. Pt encouraged to elevate HOB per pulmonology recommendations, but he refuses at this time. HOB at 30 degrees at present. Pt educated on importance of listening to his providers' expertise, regular upright sitting to optimize respiratory function. Pt falls asleep before education completed.   10:35 AM, 09/13/22 [name], PT, DPT Physical Therapist - [facility]  [phone] (ASCOM)    Cristy Colmenares C 09/13/2022, 10:34 AM

## 2022-09-13 NOTE — Progress Notes (Signed)
Progress Note   Patient: Christopher Mason EAV:[id number] DOB: 03-Nov-1960 DOA: 08/16/2022     27 DOS: the patient was seen and examined on 09/13/2022   Brief hospital course:  62 y.o. male with medical history significant of COPD, schizophrenia, multiple previous spinal surgery, hypothyroidism, bipolar disorder, tardive dyskinesia, who presented to the ED with complaints of a fall. He notes that his left leg weakness and numbness started as left hip pain a few days ago. He has had multiple falls due to the weakness. He denies any other focal weakness. He endorsed increased cough over the last few days in addition to increased shortness of breath.  Patient had steroid injection on 4/26.  5/8.  Still having a lot of back pain.  Does not remember a steroid injection.  Physical therapy recommending rehab but patient does not want to sign over monthly check. 5/9.  Patient able to come down to 2 L of oxygen.  Still having weakness in his left leg. 5/10.  ABG showing a pO2 of 121. Patient remained in the hospital , weaning of oxygen.  Complaining of ongoing pain. Abdominal distention and constipation improved. 5/15, underwent microdiscectomy with some clinical improvement. 5/17, needs skilled nursing facility. 5/18-19 - O2 needs rising again, not wheezing 5/20 - IV iron infusion, O2 down 5 >> 3 L/min today 5/21 - started low dose gabapentin for persistent LLE radicular pain, not tolerating OOB activity 5/22: stable, awaiting rehab. Will attempt to wean O2  Assessment and Plan: Lumbar radiculopathy, acute Patient with disc herniation L3/4.  Patient had steroid injection by interventional radiology on 4/26.   No improvement in weeks after injection, still unable to ambulate.   Patient underwent microdiscectomy 5/15.   Work with PT OT.  Refer to SNF. Pain management with oral pain medication along with bowel regimen. Neurosurgery removed drain (5/19) Start trial low dose gabapentin 100 mg TID  (5/21).  Monitor for efficacy and side effects, adjust dose accordingly.  Left leg weakness Secondary to disc herniation, continue to work with PT OT.  Acute respiratory failure with hypoxia (HCC) Does have history of COPD.  No baseline O2 use, per patient.  Continue bronchodilator therapy.   Keep on oxygen to keep saturation more than 90%. 5/19 - repeat chest xray given rising O2 needs 4 >> 5 l/min (had been weaned to 2 l/min) 5/21: on 1L. Wean as tolerated. Denies respiratory symptoms.   Iron deficiency anemia -- iron 24, ferritin 18.  IV iron infusion today (5/20), will give 200 mg Venofer (in 100 mL to minimize volume) Repeat IV iron 5/22 Monitor CBC  CAP (community acquired pneumonia) Treated during hospital course.  Completed antibiotic therapy.  COPD exacerbation (HCC) Completed steroids.  Continue nebulizer treatments.   Seen by pulmonology pre-operatively.  AKI (acute kidney injury) (HCC) Creatinine 1.79 on presentation and down to 0.91.  Obesity (BMI 30-39.9) BMI 38.78 Complicates overall care and prognosis.  Recommend lifestyle modifications including physical activity and diet for weight loss and overall long-term health.  Constipation X-ray showing constipation. Continue MiraLAX.  Continue aggressive bowel regimen to avoid postop complications.  Normocytic anemia - check anemia panel with AM labs  Anasarca Switched Lasix to oral, on 20 mg BID.   Echocardiogram shows normal EF.   Clinically improved.  Fatigue Increased levothyroxine with TSH being elevated above 14.  Free T4 is normal.  Schizophrenia (HCC) - Continue home amitriptyline, Invega and venlafaxine  HTN (hypertension) Blood pressure stable.  Continue propranolol and amlodipine  Patient  is stable to transfer to SNF when bed available.     Subjective: patient reports stable pain in legs- particularly left thigh. Stable from yesterday and mildly improved from admission. Pain medications  recently given and not effective yet.   Physical Exam: Vitals:   09/12/22 2107 09/13/22 0500 09/13/22 0739 09/13/22 0757  BP: (!) 144/95  131/80   Pulse: 83  94   Resp: 18  18   Temp: 98.2 F (36.8 C)  98.1 F (36.7 C)   TempSrc:      SpO2: 94%  91% 91%  Weight:  109.1 kg    Height:       General exam: awake, alert, no acute distress, obese, chronically ill appearing HEENT: nasal cannula in place, moist mucus membranes, hearing grossly normal.  Respiratory system: lungs generally diminished, no wheezes of rhonchi, diminished bases, normal respiratory effort at rest. On 3 L/min [place] O2 Cardiovascular system: normal S1/S2, RRR, no pedal edema.   Central nervous system: A&O x 3. no gross focal neurologic deficits, dysarthria.  Extremities: no edema, normal tone. Scar tissue to thigh/knee.  Skin: dry, intact, normal temperature Psychiatry: normal mood, congruent affect   Data Reviewed: CBC    Component Value Date/Time   WBC 6.1 09/11/2022 0457   RBC 3.34 (L) 09/11/2022 0457   RBC 3.33 (L) 09/11/2022 0457   HGB 9.6 (L) 09/11/2022 0457   HGB 13.9 03/15/2022 1110   HCT 31.4 (L) 09/11/2022 0457   HCT 42.7 03/15/2022 1110   PLT 252 09/11/2022 0457   PLT 246 03/15/2022 1110   MCV 94.0 09/11/2022 0457   MCV 94 03/15/2022 1110   MCH 28.7 09/11/2022 0457   MCHC 30.6 09/11/2022 0457   RDW 15.2 09/11/2022 0457   RDW 13.6 03/15/2022 1110   LYMPHSABS 0.8 08/17/2022 0443   LYMPHSABS 1.5 03/15/2022 1110   MONOABS 0.6 08/17/2022 0443   EOSABS 0.0 08/17/2022 0443   EOSABS 0.3 03/15/2022 1110   BASOSABS 0.0 08/17/2022 0443   BASOSABS 0.1 03/15/2022 1110   Notable recent labs --  iron 24, sat ratios 7%, ferritin 18, normal B12, procal < 0.10,  Hbg improved 9.6, immature retcici fraction 25.9% elevated  Family Communication: None at bedside  Disposition: Status is: Inpatient Remains inpatient appropriate because: Unsafe discharge disposition plan.  Planned Discharge Destination:  Skilled nursing facility.    Time spent: 35 minutes.  Author: Leeroy Bock, MD 09/13/2022 8:36 AM  For on call review [email or web address].

## 2022-09-14 DIAGNOSIS — M5416 Radiculopathy, lumbar region: Secondary | ICD-10-CM | POA: Diagnosis not present

## 2022-09-14 DIAGNOSIS — N179 Acute kidney failure, unspecified: Secondary | ICD-10-CM | POA: Diagnosis not present

## 2022-09-14 DIAGNOSIS — W19XXXA Unspecified fall, initial encounter: Secondary | ICD-10-CM | POA: Diagnosis not present

## 2022-09-14 LAB — BASIC METABOLIC PANEL
Anion gap: 8 (ref 5–15)
BUN: 24 mg/dL — ABNORMAL HIGH (ref 8–23)
CO2: 30 mmol/L (ref 22–32)
Calcium: 9.3 mg/dL (ref 8.9–10.3)
Chloride: 98 mmol/L (ref 98–111)
Creatinine, Ser: 0.87 mg/dL (ref 0.61–1.24)
GFR, Estimated: >60 mL/min (ref >60)
Glucose, Bld: 116 mg/dL — ABNORMAL HIGH (ref 70–99)
Potassium: 4.6 mmol/L (ref 3.5–5.1)
Sodium: 136 mmol/L (ref 135–145)

## 2022-09-14 LAB — CBC
HCT: 32.8 % — ABNORMAL LOW (ref 39.0–52.0)
Hemoglobin: 9.8 g/dL — ABNORMAL LOW (ref 13.0–17.0)
MCH: 28.1 pg (ref 26.0–34.0)
MCHC: 29.9 g/dL — ABNORMAL LOW (ref 30.0–36.0)
MCV: 94 fL (ref 80.0–100.0)
Platelets: 283 10*3/uL (ref 150–400)
RBC: 3.49 MIL/uL — ABNORMAL LOW (ref 4.22–5.81)
RDW: 15.5 % (ref 11.5–15.5)
WBC: 6.7 10*3/uL (ref 4.0–10.5)
nRBC: 0 % (ref 0.0–0.2)

## 2022-09-14 NOTE — Plan of Care (Signed)
  Problem: Clinical Measurements: Goal: Ability to maintain clinical measurements within normal limits will improve Outcome: Progressing Goal: Will remain free from infection Outcome: Progressing Goal: Respiratory complications will improve Outcome: Progressing   Problem: Activity: Goal: Risk for activity intolerance will decrease Outcome: Progressing   Problem: Coping: Goal: Level of anxiety will decrease Outcome: Progressing   Problem: Pain Managment: Goal: General experience of comfort will improve Outcome: Progressing   Problem: Safety: Goal: Ability to remain free from injury will improve Outcome: Progressing   Problem: Skin Integrity: Goal: Risk for impaired skin integrity will decrease Outcome: Progressing   

## 2022-09-14 NOTE — Progress Notes (Signed)
Physical Therapy Treatment Patient Details Name: Christopher Mason MRN: [id number] DOB: 12-20-1960 Today's Date: 09/14/2022   History of Present Illness Christopher Mason is a 61yoM withPMH: COPD, schizophrenia, spinal surgery, hypoTSH, BPD, tardive dyskinesia, who presents to the ED after falls.  MD assessment includes: COPD exacerbation, CAP, AKI, and LLE weakness. MRI impression includes: multilevel lumbar spondylosis, worst at L3-4, where a left subarticular disc extrusion results in compression of the traversing left L4 nerve root in the left lateral recess. New PT orders received 5/16 s/p left L3-4 microdiscectomy 5/15.    PT Comments    Pt in bed on entry, more awake than attempts yesterday. He is agreeable to session, continues to c/o left leg pain but appears more comfortable while in bed, later pain becomes progressive and intolerable at EOB, similar to a few days prior. Pt partakes in ROM/AROM of legs with generally good tolerance. Pt hesitant to sit up, but agreeable eventually. He tolerates ~4 minutes at EOB before heightened anxiety takes the wheel and he is assisted back to bed. HOB at 40 degrees at end of session. Pt assisted with calling a contact on room phone, as his smart phone broke yesterday and is no longer functional. Informed RN of request for pain meds and for surgical wound without dressing.     Recommendations for follow up therapy are one component of a multi-disciplinary discharge planning process, led by the attending physician.  Recommendations may be updated based on patient status, additional functional criteria and insurance authorization.  Follow Up Recommendations  Can patient physically be transported by private vehicle: No    Assistance Recommended at Discharge Frequent or constant Supervision/Assistance  Patient can return home with the following A lot of help with bathing/dressing/bathroom;Assistance with cooking/housework;Direct supervision/assist for  medications management;Assist for transportation;Help with stairs or ramp for entrance;Two people to help with walking and/or transfers   Equipment Recommendations  Rolling walker (2 wheels);BSC/3in1;Wheelchair (measurements PT);Wheelchair cushion (measurements PT);Hospital bed    Recommendations for Other Services       Precautions / Restrictions Precautions Precautions: Fall;Back Required Braces or Orthoses: Knee Immobilizer - Left Knee Immobilizer - Left: On when out of bed or walking Restrictions Weight Bearing Restrictions: No Other Position/Activity Restrictions: OK to use LLE KI as needed to the LLE     Mobility  Bed Mobility Overal bed mobility: Needs Assistance Bed Mobility: Rolling, Sidelying to Sit, Sit to Sidelying Rolling: Mod assist Sidelying to sit: Min assist     Sit to sidelying: Min guard General bed mobility comments: inititally symptoms similar to supine level, but progressive left thigh pain intensifies and pt becomes increasingly anxious.    Transfers                        Ambulation/Gait                   Stairs             Wheelchair Mobility    Modified Rankin (Stroke Patients Only)       Balance                                            Cognition Arousal/Alertness: Awake/alert Behavior During Therapy: WFL for tasks assessed/performed Overall Cognitive Status: Within Functional Limits for tasks assessed  General Comments: anxious, pain not controlled at EOB, not at goal.        Exercises General Exercises - Lower Extremity Short Arc Quad: AAROM, AROM, Both, 10 reps, Supine Heel Slides: Strengthening, AAROM, Both, 15 reps, Supine Straight Leg Raises: AROM, Right, Supine, 10 reps    General Comments        Pertinent Vitals/Pain Pain Assessment Pain Assessment: 0-10 Pain Score: 10-Worst pain ever Pain Descriptors / Indicators: Sore,  Aching, Grimacing, Moaning, Shooting    Home Living                          Prior Function            PT Goals (current goals can now be found in the care plan section) Acute Rehab PT Goals Patient Stated Goal: To walk better PT Goal Formulation: With patient Time For Goal Achievement: 09/21/22 Potential to Achieve Goals: Poor Progress towards PT goals: Not progressing toward goals - comment    Frequency    Min 3X/week      PT Plan Current plan remains appropriate    Co-evaluation              AM-PAC PT "6 Clicks" Mobility   Outcome Measure  Help needed turning from your back to your side while in a flat bed without using bedrails?: A Lot Help needed moving from lying on your back to sitting on the side of a flat bed without using bedrails?: A Lot Help needed moving to and from a bed to a chair (including a wheelchair)?: Total Help needed standing up from a chair using your arms (e.g., wheelchair or bedside chair)?: Total Help needed to walk in hospital room?: Total Help needed climbing 3-5 steps with a railing? : Total 6 Click Score: 8    End of Session Equipment Utilized During Treatment: Oxygen Activity Tolerance: Patient limited by pain Patient left: with call bell/phone within reach;in bed;with bed alarm set Nurse Communication: Mobility status (HOB at 40 degrees, pt on phone call to friend Christopher Mason) PT Visit Diagnosis: Muscle weakness (generalized) (M62.81);Difficulty in walking, not elsewhere classified (R26.2) Pain - part of body: Leg     Time: 1610-9604 PT Time Calculation (min) (ACUTE ONLY): 22 min  Charges:  $Therapeutic Activity: 8-22 mins                    10:53 AM, 09/14/22 [name], PT, DPT Physical Therapist - [facility]  [phone] (ASCOM)    Posey Petrik C 09/14/2022, 10:46 AM

## 2022-09-14 NOTE — Progress Notes (Signed)
    Attending Progress Note  History: Christopher Mason is s/p left L3-4 microdiscectomy   POD8: Continued weakness and pain, relatively stable POD3: No new findings POD1: The patient reports soreness in his back since surgery but admits to significant improvement of his preoperative left leg.  Physical Exam: Vitals:   09/13/22 2210 09/13/22 2214  BP: 127/75 127/75  Pulse: 83 81  Resp:    Temp:    SpO2:  92%    AA Ox3 CNI  Strength: MAEW, LLE with continued pain in L quad with movement.  4-/5 on my exam in KE   Assessment/Plan:  Christopher Mason is a 62 year old senting with left-sided lumbar radiculopathy due to a now 3-4 disc herniation with associated weakness status post microdiscectomy.    - mobilize - pain control - DVT prophylaxis - PTOT - likely to have an extended recovery. - We will remove stitches next week if he is still in hospital  Venetia Night MD Department of Neurosurgery

## 2022-09-14 NOTE — Progress Notes (Signed)
Progress Note   Patient: Christopher Mason ZOX:[id number] DOB: 05-Mar-1961 DOA: 08/16/2022     28 DOS: the patient was seen and examined on 09/14/2022   Brief hospital course:  62 y.o. male with medical history significant of COPD, schizophrenia, multiple previous spinal surgery, hypothyroidism, bipolar disorder, tardive dyskinesia, who presented to the ED with complaints of a fall. He notes that his left leg weakness and numbness started as left hip pain a few days ago. He has had multiple falls due to the weakness. He denies any other focal weakness. He endorsed increased cough over the last few days in addition to increased shortness of breath.  Patient had steroid injection on 4/26.  5/8.  Still having a lot of back pain.  Does not remember a steroid injection.  Physical therapy recommending rehab but patient does not want to sign over monthly check. 5/9.  Patient able to come down to 2 L of oxygen.  Still having weakness in his left leg. 5/10.  ABG showing a pO2 of 121. Patient remained in the hospital , weaning of oxygen.  Complaining of ongoing pain. Abdominal distention and constipation improved. 5/15, underwent microdiscectomy with some clinical improvement. 5/17, needs skilled nursing facility. 5/18-19 - O2 needs rising again, not wheezing 5/20 - IV iron infusion, O2 down 5 >> 3 L/min today 5/21 - started low dose gabapentin for persistent LLE radicular pain, not tolerating OOB activity 5/22-5/23: stable, awaiting rehab. Will attempt to wean O2  Assessment and Plan: Lumbar radiculopathy, acute on chronic  bilateral leg weakness Patient with disc herniation L3/4. IR performed steroid injection 4/26.   No improvement in weeks after injection, still unable to ambulate 2/2 pain.  Neurosurgery performed microdiscectomy 5/15, drain removed 5/19.  - ongoing PT/OT - recommending SNF. TOC engaged - analgesia PRN  - gabapentin, norco - neurosurgery following, appreciate your care - bowel  regimen  Acute respiratory failure with hypoxia (HCC) Does have history of COPD.  No baseline O2 use, per patient.  Continue bronchodilator therapy.   Keep on oxygen to keep saturation more than 90%. 5/19 - repeat chest xray given rising O2 needs 4 >> 5 l/min (had been weaned to 2 l/min) 5/21: on 1-2L. Wean as tolerated. Denies respiratory symptoms.  - continue breathing treatments  Iron deficiency anemia -- iron 24, ferritin 18. IV iron infusion 5/20 Repeat IV iron 5/22 Monitor CBC  COPD  CAP- stable. Completed antibiotic and steroid course inpatient. Seen by pulmonology pre-operatively. - Continue nebulizer treatments.  AKI- resolved Creatinine 1.79 on presentation and down to 0.91.  Obesity (BMI 30-39.9) BMI 38.78 Complicates overall care and prognosis.  Recommend lifestyle modifications including physical activity and diet for weight loss and overall long-term health.  Constipation X-ray showing constipation. Continue MiraLAX.  Continue aggressive bowel regimen to avoid postop complications.  Anasarca Switched Lasix to oral, on 20 mg BID.   Echocardiogram shows normal EF.   Clinically improved.  Fatigue- Increased levothyroxine with TSH being elevated above 14 5/14.  Free T4 is normal. - recheck TSH in about 3 weeks  Schizophrenia (HCC) - Continue home amitriptyline, Invega and venlafaxine  HTN (hypertension) Blood pressure stable.  Continue propranolol and amlodipine  Patient is stable to transfer to SNF when bed available.     Subjective: patient reports leg pain which is unchanged from daily. Still has very limited mobility. No other questions or complaints at this time.   Physical Exam: Vitals:   09/13/22 1619 09/13/22 1919 09/13/22 2210 09/13/22 2214  BP: 116/67  127/75 127/75  Pulse: 82  83 81  Resp: (!) 21     Temp: 98.5 F (36.9 C)     TempSrc:      SpO2: 91% 91%  92%  Weight:      Height:       General exam: awake, alert, no acute distress,  obese, chronically ill appearing HEENT: nasal cannula in place, dry mucus membranes, hearing grossly normal.  Respiratory system: CTAB, no wheezes of rhonchi, diminished bases, normal respiratory effort at rest. On 2 L/min [place] O2 Cardiovascular system: normal S1/S2, RRR, no pedal edema.   Central nervous system: A&O x 3. no gross focal neurologic deficits, dysarthria.  Extremities: no edema, normal tone. Scar tissue to thigh/knee.  Skin: dry, intact, normal temperature Psychiatry: normal mood, congruent affect   Data Reviewed: CBC    Component Value Date/Time   WBC 6.7 09/14/2022 0449   RBC 3.49 (L) 09/14/2022 0449   HGB 9.8 (L) 09/14/2022 0449   HGB 13.9 03/15/2022 1110   HCT 32.8 (L) 09/14/2022 0449   HCT 42.7 03/15/2022 1110   PLT 283 09/14/2022 0449   PLT 246 03/15/2022 1110   MCV 94.0 09/14/2022 0449   MCV 94 03/15/2022 1110   MCH 28.1 09/14/2022 0449   MCHC 29.9 (L) 09/14/2022 0449   RDW 15.5 09/14/2022 0449   RDW 13.6 03/15/2022 1110   LYMPHSABS 0.8 08/17/2022 0443   LYMPHSABS 1.5 03/15/2022 1110   MONOABS 0.6 08/17/2022 0443   EOSABS 0.0 08/17/2022 0443   EOSABS 0.3 03/15/2022 1110   BASOSABS 0.0 08/17/2022 0443   BASOSABS 0.1 03/15/2022 1110   Family Communication: None at bedside  Disposition: Status is: Inpatient Remains inpatient appropriate because: Unsafe discharge disposition plan.  Planned Discharge Destination: Skilled nursing facility.    Time spent: 35 minutes.  Author: Leeroy Bock, MD 09/14/2022 7:31 AM  For on call review [email or web address].

## 2022-09-14 NOTE — Plan of Care (Signed)

## 2022-09-14 NOTE — TOC Progression Note (Addendum)
Transition of Care [facility]) - Progression Note    Patient Details  Name: Christopher Mason MRN: [id number] Date of Birth: 1960-06-27  Transition [facility]) CM/SW Contact  Marlowe Sax, RN Phone Number: 09/14/2022, 9:36 AM  Clinical Narrative:   Uploaded clinical information to [facility] Must to obtain PASSR  Update PASSR obtained [id number] E    Barriers to Discharge: Continued Medical Work up  Expected Discharge Plan and Services   Discharge Planning Services: CM Consult                     DME Arranged: N/A DME Agency: NA                   Social Determinants of Health (SDOH) Interventions SDOH Screenings   Food Insecurity: No Food Insecurity (08/16/2022)  Housing: Low Risk  (08/16/2022)  Transportation Needs: No Transportation Needs (08/16/2022)  Utilities: Not At Risk (08/16/2022)  Alcohol Screen: Low Risk  (09/06/2021)  Depression (PHQ2-9): Low Risk  (09/06/2021)  Tobacco Use: High Risk (09/07/2022)    Readmission Risk Interventions     No data to display

## 2022-09-14 NOTE — TOC Progression Note (Signed)
Transition of Care [facility]) - Progression Note    Patient Details  Name: Christopher Mason MRN: [id number] Date of Birth: 06-22-1960  Transition [facility]) CM/SW Contact  Marlowe Sax, RN Phone Number: 09/14/2022, 11:01 AM  Clinical Narrative:   Spoke with the patient on the room at the bedside, reviewed the bed choices, he chose [facility] care and understands that he will have to  Stay for 30 days and turn over his check     Barriers to Discharge: Continued Medical Work up  Expected Discharge Plan and Services   Discharge Planning Services: CM Consult                     DME Arranged: N/A DME Agency: NA                   Social Determinants of Health (SDOH) Interventions SDOH Screenings   Food Insecurity: No Food Insecurity (08/16/2022)  Housing: Low Risk  (08/16/2022)  Transportation Needs: No Transportation Needs (08/16/2022)  Utilities: Not At Risk (08/16/2022)  Alcohol Screen: Low Risk  (09/06/2021)  Depression (PHQ2-9): Low Risk  (09/06/2021)  Tobacco Use: High Risk (09/07/2022)    Readmission Risk Interventions     No data to display

## 2022-09-14 NOTE — Progress Notes (Signed)
Occupational Therapy Treatment Patient Details Name: Christopher Mason MRN: [id number] DOB: 1960/08/11 Today's Date: 09/14/2022   History of present illness Christopher Mason is a 61yoM withPMH: COPD, schizophrenia, spinal surgery, hypoTSH, BPD, tardive dyskinesia, who presents to the ED after falls.  MD assessment includes: COPD exacerbation, CAP, AKI, and LLE weakness. MRI impression includes: multilevel lumbar spondylosis, worst at L3-4, where a left subarticular disc extrusion results in compression of the traversing left L4 nerve root in the left lateral recess. New PT orders received 5/16 s/p left L3-4 microdiscectomy 5/15.   OT comments  Pt seen for OT treatment on this date. Upon arrival to room pt laying in bed, agreeable to tx. Pt requires CGA for bed mobility. MIN A 2+ for sit<> stand at the sink. Pt required encouragement to participate in treatment, due to anxiety 2/2 anticipating pain with movement. After session, pt voiced interest in walking next session. Pt making progress toward goals, will continue to follow POC. Discharge recommendation remains appropriate.     Recommendations for follow up therapy are one component of a multi-disciplinary discharge planning process, led by the attending physician.  Recommendations may be updated based on patient status, additional functional criteria and insurance authorization.    Assistance Recommended at Discharge Frequent or constant Supervision/Assistance  Patient can return home with the following  A lot of help with bathing/dressing/bathroom;Assistance with cooking/housework;Assist for transportation;Help with stairs or ramp for entrance;Direct supervision/assist for medications management;A little help with walking and/or transfers   Equipment Recommendations  BSC/3in1    Recommendations for Other Services      Precautions / Restrictions Precautions Precautions: Fall;Back Required Braces or Orthoses: Knee Immobilizer - Left Knee  Immobilizer - Left: On when out of bed or walking Restrictions Weight Bearing Restrictions: No Other Position/Activity Restrictions: OK to use LLE KI as needed to the LLE       Mobility Bed Mobility Overal bed mobility: Needs Assistance Bed Mobility: Supine to Sit, Sit to Supine     Supine to sit: Min assist Sit to supine: Min guard   General bed mobility comments: Pt. demonstrated appropriate strength for bed mobility, limited by pain    Transfers Overall transfer level: Needs assistance Equipment used: Rolling walker (2 wheels) Transfers: Sit to/from Stand Sit to Stand: Min assist, +2 physical assistance           General transfer comment: MIN A +2 stood at sink for 1 min. limited due to pain.     Balance Overall balance assessment: Needs assistance Sitting-balance support: No upper extremity supported, Feet supported Sitting balance-Leahy Scale: Fair     Standing balance support: Bilateral upper extremity supported, Reliant on assistive device for balance Standing balance-Leahy Scale: Fair                             ADL either performed or assessed with clinical judgement   ADL Overall ADL's : Needs assistance/impaired                                     Functional mobility during ADLs: Rolling walker (2 wheels);Minimal assistance      Extremity/Trunk Assessment Upper Extremity Assessment Upper Extremity Assessment: Overall WFL for tasks assessed   Lower Extremity Assessment Lower Extremity Assessment: Overall WFL for tasks assessed        Vision       Perception  Praxis      Cognition Arousal/Alertness: Awake/alert Behavior During Therapy: WFL for tasks assessed/performed Overall Cognitive Status: Within Functional Limits for tasks assessed                                 General Comments: anxious, pain not controlled at EOB, not at goal.        Exercises      Shoulder Instructions        General Comments      Pertinent Vitals/ Pain       Pain Assessment Pain Assessment: 0-10 Pain Score: 10-Worst pain ever Pain Location: L LE/thigh Pain Descriptors / Indicators: Sore, Aching, Grimacing, Moaning, Shooting Pain Intervention(s): Monitored during session, Repositioned (movement and seated position trigger p!. P! decreased with repositioning)  Home Living                                          Prior Functioning/Environment              Frequency  Min 1X/week        Progress Toward Goals  OT Goals(current goals can now be found in the care plan section)  Progress towards OT goals: Progressing toward goals  Acute Rehab OT Goals Patient Stated Goal: I want to try to walk OT Goal Formulation: With patient Time For Goal Achievement: 09/21/22 Potential to Achieve Goals: Good ADL Goals Pt Will Perform Grooming: with modified independence;standing Pt Will Perform Lower Body Dressing: with modified independence;sit to/from stand Pt Will Transfer to Toilet: with modified independence;ambulating Pt Will Perform Toileting - Clothing Manipulation and hygiene: with modified independence;sit to/from stand  Plan Discharge plan remains appropriate;Frequency remains appropriate    Co-evaluation                 AM-PAC OT "6 Clicks" Daily Activity     Outcome Measure   Help from another person eating meals?: None Help from another person taking care of personal grooming?: A Little Help from another person toileting, which includes using toliet, bedpan, or urinal?: A Lot Help from another person bathing (including washing, rinsing, drying)?: A Lot Help from another person to put on and taking off regular upper body clothing?: A Little Help from another person to put on and taking off regular lower body clothing?: A Lot 6 Click Score: 16    End of Session Equipment Utilized During Treatment: Oxygen  OT Visit Diagnosis: Unsteadiness on feet  (R26.81);Muscle weakness (generalized) (M62.81);History of falling (Z91.81) Pain - Right/Left: Left Pain - part of body: Leg   Activity Tolerance     Patient Left in bed;with call bell/phone within reach;with bed alarm set   Nurse Communication          Time: 1610-9604 OT Time Calculation (min): 15 min  Charges: OT General Charges $OT Visit: 1 Visit OT Treatments $Self Care/Home Management : 8-22 mins  Thresa Ross, OTS

## 2022-09-15 DIAGNOSIS — N179 Acute kidney failure, unspecified: Secondary | ICD-10-CM | POA: Diagnosis not present

## 2022-09-15 DIAGNOSIS — J9811 Atelectasis: Secondary | ICD-10-CM | POA: Diagnosis not present

## 2022-09-15 DIAGNOSIS — J189 Pneumonia, unspecified organism: Secondary | ICD-10-CM | POA: Diagnosis not present

## 2022-09-15 DIAGNOSIS — J9601 Acute respiratory failure with hypoxia: Secondary | ICD-10-CM | POA: Diagnosis not present

## 2022-09-15 DIAGNOSIS — M5416 Radiculopathy, lumbar region: Secondary | ICD-10-CM | POA: Diagnosis not present

## 2022-09-15 DIAGNOSIS — W19XXXA Unspecified fall, initial encounter: Secondary | ICD-10-CM | POA: Diagnosis not present

## 2022-09-15 NOTE — Progress Notes (Signed)
Progress Note   Patient: Christopher Mason ZOX:[id number] DOB: 07-Feb-1961 DOA: 08/16/2022     29 DOS: the patient was seen and examined on 09/15/2022   Brief hospital course:  62 y.o. male with medical history significant of COPD, schizophrenia, multiple previous spinal surgery, hypothyroidism, bipolar disorder, tardive dyskinesia, who presented to the ED with complaints of a fall. He notes that his left leg weakness and numbness started as left hip pain a few days ago. He has had multiple falls due to the weakness. He denies any other focal weakness. He endorsed increased cough over the last few days in addition to increased shortness of breath.  Patient had steroid injection on 4/26.  5/8.  Still having a lot of back pain. PT recommending SNF 5/9.  Patient able to come down to 2 L of oxygen.  Still having weakness in his left leg. 5/10.  ABG showing a pO2 of 121. Patient remained in the hospital , weaning of oxygen.  Complaining of ongoing pain. Abdominal distention and constipation improved. 5/15, underwent microdiscectomy with some clinical improvement. 5/17, needs skilled nursing facility. 5/18-19 - O2 needs rising again, not wheezing 5/20 - IV iron infusion, O2 down 5 >> 3 L/min today 5/21 - started low dose gabapentin for persistent LLE radicular pain, not tolerating OOB activity 5/22-5/24: stable, awaiting rehab. Will attempt to wean O2  Assessment and Plan: Lumbar radiculopathy, acute on chronic  bilateral leg weakness Patient with disc herniation L3/4. IR performed steroid injection 4/26.   No improvement in weeks after injection, still unable to ambulate 2/2 pain.  Neurosurgery performed microdiscectomy 5/15, drain removed 5/19.  - ongoing PT/OT - recommending SNF. TOC engaged - analgesia PRN  - gabapentin, norco - neurosurgery following, appreciate your care - bowel regimen- last BM was 5/23  COPD-  No baseline O2 use, per patient. Has been stable around 3L last several  days - Continue bronchodilator therapy.   - Keep on oxygen to keep saturation more than 88%. 5/19 - repeat chest xray given rising O2 needs 4 >> 5 l/min (had been weaned to 2 l/min) 5/21: on 1-2L. Wean as tolerated. Denies respiratory symptoms.   Iron deficiency anemia -- iron 24, ferritin 18. IV iron infusion 5/20 Repeat IV iron 5/22 Monitor CBC  CAP- stable. Completed antibiotic and steroid course inpatient. Seen by pulmonology pre-operatively. - Continue nebulizer treatments.  AKI- resolved Creatinine 1.79 on presentation and down to 0.91.  Obesity (BMI 30-39.9) BMI 38.78 Complicates overall care and prognosis.  Recommend lifestyle modifications including physical activity and diet for weight loss and overall long-term health.  Anasarca Switched Lasix to oral, on 20 mg BID.   Echocardiogram shows normal EF.   Clinically improved.  Fatigue- Increased levothyroxine with TSH being elevated above 14 5/14.  Free T4 is normal. - recheck TSH in about 3 weeks  Schizophrenia (HCC) - Continue home amitriptyline, Invega and venlafaxine  HTN (hypertension) Blood pressure stable.  Continue propranolol and amlodipine  Patient is stable to transfer to SNF when bed available.     Subjective: patient reports doing ok today. No complaints.   Physical Exam: Vitals:   09/14/22 1614 09/14/22 1930 09/14/22 2223 09/15/22 0500  BP: 117/77  129/67   Pulse: 82  83   Resp: 18  20   Temp: 98.7 F (37.1 C)  98.2 F (36.8 C)   TempSrc:      SpO2: 99% 91% 93%   Weight:    104.4 kg  Height:  General exam: awake, alert, no acute distress, obese, chronically ill appearing HEENT: nasal cannula in place, dry mucus membranes, hearing grossly normal.  Respiratory system: CTAB, no wheezes of rhonchi, diminished bases, normal respiratory effort at rest. On 2 L/min [place] O2 Cardiovascular system: normal S1/S2, RRR, no pedal edema.   Central nervous system: A&O x 3. no gross focal neurologic  deficits, dysarthria.  Extremities: no edema, normal tone. Scar tissue to thigh/knee.  Skin: dry, intact, normal temperature Psychiatry: normal mood, congruent affect   Data Reviewed: CBC    Component Value Date/Time   WBC 6.7 09/14/2022 0449   RBC 3.49 (L) 09/14/2022 0449   HGB 9.8 (L) 09/14/2022 0449   HGB 13.9 03/15/2022 1110   HCT 32.8 (L) 09/14/2022 0449   HCT 42.7 03/15/2022 1110   PLT 283 09/14/2022 0449   PLT 246 03/15/2022 1110   MCV 94.0 09/14/2022 0449   MCV 94 03/15/2022 1110   MCH 28.1 09/14/2022 0449   MCHC 29.9 (L) 09/14/2022 0449   RDW 15.5 09/14/2022 0449   RDW 13.6 03/15/2022 1110   LYMPHSABS 0.8 08/17/2022 0443   LYMPHSABS 1.5 03/15/2022 1110   MONOABS 0.6 08/17/2022 0443   EOSABS 0.0 08/17/2022 0443   EOSABS 0.3 03/15/2022 1110   BASOSABS 0.0 08/17/2022 0443   BASOSABS 0.1 03/15/2022 1110   Family Communication: None at bedside  Disposition: Status is: Inpatient Remains inpatient appropriate because: Unsafe discharge disposition plan.  Planned Discharge Destination: Skilled nursing facility.    Time spent: 35 minutes.  Author: Leeroy Bock, MD 09/15/2022 7:35 AM  For on call review [email or web address].

## 2022-09-15 NOTE — Progress Notes (Signed)
Physical Therapy Treatment Patient Details Name: Christopher Mason MRN: [id number] DOB: November 10, 1960 Today's Date: 09/15/2022   History of Present Illness Sambath Karm is a 61yoM withPMH: COPD, schizophrenia, spinal surgery, hypoTSH, BPD, tardive dyskinesia, who presents to the ED after falls.  MD assessment includes: COPD exacerbation, CAP, AKI, and LLE weakness. MRI impression includes: multilevel lumbar spondylosis, worst at L3-4, where a left subarticular disc extrusion results in compression of the traversing left L4 nerve root in the left lateral recess. New PT orders received 5/16 s/p left L3-4 microdiscectomy 5/15.    PT Comments    Pt finished with breakfast now, agreeable to session, pt continues to complain of Left thigh pain, worse apparently than when he was sitting with HOB higher for breakfast. Pt does well with leg exercises in bed, he does not appear limited in performance due to any exacerbation of back pain. His left knee extension remains remarkably weak (1/5). Pt able to advance sets/reps this date and return to 5lb AW use on RLE. We again discuss why his Rt leg is being the target of interventions, thrice discussed previous day, no recollection of this conversation. HOB at 30+ degrees at end of session, used bed features to achieve this and maintain comfort.     Recommendations for follow up therapy are one component of a multi-disciplinary discharge planning process, led by the attending physician.  Recommendations may be updated based on patient status, additional functional criteria and insurance authorization.  Follow Up Recommendations  Can patient physically be transported by private vehicle: No    Assistance Recommended at Discharge Frequent or constant Supervision/Assistance  Patient can return home with the following A lot of help with bathing/dressing/bathroom;Assistance with cooking/housework;Direct supervision/assist for medications management;Assist for  transportation;Help with stairs or ramp for entrance;Two people to help with walking and/or transfers   Equipment Recommendations  Rolling walker (2 wheels);BSC/3in1;Wheelchair (measurements PT);Wheelchair cushion (measurements PT);Hospital bed    Recommendations for Other Services       Precautions / Restrictions Precautions Precautions: Fall;Back Precaution Booklet Issued: No Required Braces or Orthoses: Knee Immobilizer - Left Knee Immobilizer - Left: On when out of bed or walking Restrictions Other Position/Activity Restrictions: OK to use LLE KI as needed to the LLE     Mobility  Bed Mobility Overal bed mobility:  (largely deffered, substantial pain and axioety at EOB previous day; pt defers today)                  Transfers                        Ambulation/Gait                   Stairs             Wheelchair Mobility    Modified Rankin (Stroke Patients Only)       Balance                                            Cognition Arousal/Alertness: Awake/alert Behavior During Therapy: WFL for tasks assessed/performed Overall Cognitive Status: Within Functional Limits for tasks assessed  Exercises General Exercises - Lower Extremity Short Arc Quad: (P) 10 reps, Strengthening, Right (5lbAW: 3x10) Heel Slides: (P) 15 reps, Supine, Right, AROM (3x10, 5lb AW) Hip ABduction/ADduction: (P) Supine, 15 reps, Right, AROM (3x10, 5lb) Other Exercises Other Exercises: LLE: AA/ROM 3x10 (minimal to none quads activation); LLE heel slides assisted, resisted eccentic 3x10    General Comments        Pertinent Vitals/Pain      Home Living                          Prior Function            PT Goals (current goals can now be found in the care plan section) Acute Rehab PT Goals Patient Stated Goal: To walk better PT Goal Formulation: With  patient Time For Goal Achievement: 09/21/22 Potential to Achieve Goals: Poor Progress towards PT goals: Progressing toward goals    Frequency    Min 3X/week      PT Plan Current plan remains appropriate    Co-evaluation              AM-PAC PT "6 Clicks" Mobility   Outcome Measure  Help needed turning from your back to your side while in a flat bed without using bedrails?: A Lot Help needed moving from lying on your back to sitting on the side of a flat bed without using bedrails?: A Lot Help needed moving to and from a bed to a chair (including a wheelchair)?: Total Help needed standing up from a chair using your arms (e.g., wheelchair or bedside chair)?: Total Help needed to walk in hospital room?: Total Help needed climbing 3-5 steps with a railing? : Total 6 Click Score: 8    End of Session Equipment Utilized During Treatment: Oxygen Activity Tolerance: Patient limited by pain Patient left: with call bell/phone within reach;in bed;with bed alarm set Nurse Communication:  (HOB 30+ degrees at EOB) PT Visit Diagnosis: Muscle weakness (generalized) (M62.81);Difficulty in walking, not elsewhere classified (R26.2) Pain - Right/Left: Left Pain - part of body: Leg     Time: 1005-1030 PT Time Calculation (min) (ACUTE ONLY): 25 min  Charges:  $Therapeutic Exercise: 23-37 mins                    10:56 AM, 09/15/22 [name], PT, DPT Physical Therapist - [facility]  [phone] (ASCOM)    Francene Mcerlean C 09/15/2022, 10:48 AM

## 2022-09-15 NOTE — Plan of Care (Signed)
  Problem: Education: Goal: Knowledge of General Education information will improve Description: Including pain rating scale, medication(s)/side effects and non-pharmacologic comfort measures Outcome: Progressing   Problem: Health Behavior/Discharge Planning: Goal: Ability to manage health-related needs will improve Outcome: Progressing   Problem: Clinical Measurements: Goal: Will remain free from infection Outcome: Progressing Goal: Diagnostic test results will improve Outcome: Progressing Goal: Respiratory complications will improve Outcome: Progressing Goal: Cardiovascular complication will be avoided Outcome: Progressing   Problem: Nutrition: Goal: Adequate nutrition will be maintained Outcome: Progressing   Problem: Coping: Goal: Level of anxiety will decrease Outcome: Progressing   Problem: Elimination: Goal: Will not experience complications related to bowel motility Outcome: Progressing Goal: Will not experience complications related to urinary retention Outcome: Progressing   Problem: Safety: Goal: Ability to remain free from injury will improve Outcome: Progressing   Problem: Skin Integrity: Goal: Risk for impaired skin integrity will decrease Outcome: Progressing   Problem: Education: Goal: Ability to verbalize activity precautions or restrictions will improve Outcome: Progressing   Problem: Activity: Goal: Ability to avoid complications of mobility impairment will improve Outcome: Progressing Goal: Ability to tolerate increased activity will improve Outcome: Progressing Goal: Will remain free from falls Outcome: Progressing   Problem: Bowel/Gastric: Goal: Gastrointestinal status for postoperative course will improve Outcome: Progressing   Problem: Pain Management: Goal: Pain level will decrease Outcome: Progressing

## 2022-09-15 NOTE — Plan of Care (Signed)
  Problem: Clinical Measurements: Goal: Will remain free from infection Outcome: Progressing   Problem: Nutrition: Goal: Adequate nutrition will be maintained Outcome: Progressing   Problem: Pain Managment: Goal: General experience of comfort will improve Outcome: Progressing   Problem: Safety: Goal: Ability to remain free from injury will improve Outcome: Progressing   

## 2022-09-15 NOTE — Progress Notes (Signed)
NAME:  Christopher Mason, MRN:  [id number], DOB:  1960-06-05, LOS: 29 ADMISSION DATE:  08/16/2022, CONSULTATION DATE: 05 Sep 2022 REFERRING MD: Alford Highland, MD, CHIEF COMPLAINT: Assess respiratory status prior to surgery  History of Present Illness:  Christopher Mason is a 62 year old smoker with a 50 pack year history and a medical history as noted below, who we are evaluating today for assessment of his respiratory status prior to surgery.  He is well-known to [facility] from clinic follow-up there for COPD.  His baseline FEV1 is 1.59 L or 50% of predicted with an FVC of 2.97 L or 70% predicted and FEV1/FVC of 54% consistent with moderate to severe obstructive defect.  This according to the most recent PFTs on December 2023.  Did not have bronchodilator response at that time.  Patient is not a very good historian he has underlying issues with schizophrenia and sometimes has flight of ideas during interview.  In any event he was admitted 20 days ago to [facility] due to weakness of the lower extremities and increasing shortness of breath.  He noted left lower extremity weakness and numbness and fell on the day of admission which was 24 April.  Been having multiple falls at home due to this.  Prior to admission he had increased cough over his baseline.  He has been pretty much bed bound since he has been in the hospital.  He was noted to have significant lumbar radiculopathy and disc herniation of L3-L4.  Steroid injection by interventional radiology was performed on 26 April but this has not made matters any better.  Neurosurgery needs to do a microdiscectomy to decompress nerve impingement.  He has been maintained on DuoNeb and Pulmicort via nebulizer during this hospitalization.  He has required oxygen due to marginal oxygen saturations.  Chest x-rays have shown significant atelectasis and this is likely due to his bedbound status.  At the time of my interview with him he was laying flat in bed  comfortable.  Had no issues with cough or wheezing.  No chest pain.  No tachypalpitations.  Pertinent  Medical History  COPD moderate to severe Tobacco/marijuana abuse Schizophrenia Lumbar radiculopathy  Significant Hospital Events: Including procedures, antibiotic start and stop dates in addition to other pertinent events   05/15: S/P micro discectomy L3/L4, under GA, no resp complic 05/16: Able to sit up in chair feeling better 05/17: Respiratory wise, feels he is at baseline 05/18: O2 needs increasing again 05/21: O2  back down to 3 L/min, adjustments made to patient's bronchodilator therapy 05/22: O2 requirement down to 1 L/min 05/24: O2 requirements fluctuate between 1 to 3 L/min.  This is likely his "new normal"  Interim History / Subjective:  His main complaint is that of pain in his legs.  He spends the majority of his time laying flat in bed which in turn leads to atelectasis.  Continues to do well on current bronchodilator therapy: Brovana/Pulmicort plus Yupelri.  NEEDS MOBILIZATION  Objective   Blood pressure 117/83, pulse 78, temperature 98.3 F (36.8 C), resp. rate 17, height 5\' 7"  (1.702 m), weight 104.4 kg, SpO2 93 %.    SpO2: 93 % O2 Flow Rate (L/min): 3 L/min FiO2 (%): 28 % *However, when I saw patient he was on 1 L/min.  Intake/Output Summary (Last 24 hours) at 09/15/2022 1014 Last data filed at 09/15/2022 0800 Gross per 24 hour  Intake 2334 ml  Output 1725 ml  Net 609 ml    [employer]  09/13/22 0500 09/14/22 0704 09/15/22 0500  Weight: 109.1 kg 108 kg 104.4 kg    Examination: GENERAL: Obese gentleman, no acute distress.  Laying in bed, comfortable nasal cannula O2.  No conversational dyspnea.  HEAD: Normocephalic, atraumatic.  EYES: Pupils equal, round, reactive to light.  No scleral icterus.  MOUTH: Macroglossia, lingual dyskinesia. NECK: Supple. No thyromegaly. Trachea midline. No JVD.  No adenopathy. PULMONARY: Good air entry bilaterally.  No  adventitious sounds, moving air well. CARDIOVASCULAR: S1 and S2. Regular rate and rhythm.  ABDOMEN: Significant truncal obesity. MUSCULOSKELETAL: Left arm limited motion due to prior scar tissue (burn), no clubbing, no edema. Decreased muscle mass on the left lower extremity due to prior poliomyelitis  NEUROLOGIC: Patient exhibits lingual tardive dyskinesia, dysarthria due to the same. SKIN: Intact,warm,dry. PSYCH: Normal mood.  Normal behavior.  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 Compensated at present NEEDS MOBILIZATION, OOB Optimized respiratory medications: Brovana/Pulmicort twice daily and Yupelri, once a day Do not use systemic steroids unless patient exhibits wheezing Pulmonary hygiene: Acapella, IS, needs encouragement (not using consistently)  Acute respiratory failure with hypoxia Atelectasis, bibasilar Atelectasis likely due to being bedbound Tolerated discectomy Out of bed protocol initiated, having difficulty participating Continue pulmonary hygiene Continue incentive spirometry every 2 hours while awake Continue Acapella Wean O2 off as tolerated for sats of 90% or better   Labs   CBC: Recent Labs  Lab 09/10/22 0442 09/11/22 0457 09/14/22 0449  WBC 7.2 6.1 6.7  HGB 9.2* 9.6* 9.8*  HCT 30.7* 31.4* 32.8*  MCV 94.5 94.0 94.0  PLT 232 252 283     Basic Metabolic Panel: Recent Labs  Lab 09/10/22 0442 09/14/22 0449  NA 133* 136  K 4.6 4.6  CL 99 98  CO2 30 30  GLUCOSE 122* 116*  BUN 19 24*  CREATININE 0.83 0.87  CALCIUM 8.7* 9.3    GFR: Estimated Creatinine Clearance: 101.4 mL/min (by C-G formula based on SCr of 0.87 mg/dL). Recent Labs  Lab 09/10/22 0442 09/11/22 0454 09/11/22 0457 09/14/22 0449  PROCALCITON  --  <0.10  --   --   WBC 7.2  --  6.1 6.7     Liver Function Tests: No results for input(s): "AST", "ALT", "ALKPHOS", "BILITOT", "PROT", "ALBUMIN" in the last 168 hours. No results  for input(s): "LIPASE", "AMYLASE" in the last 168 hours. No results for input(s): "AMMONIA" in the last 168 hours.  ABG    Component Value Date/Time   PHART 7.5 (H) 09/01/2022 1052   PCO2ART 42 09/01/2022 1052   PO2ART 121 (H) 09/01/2022 1052   HCO3 32.8 (H) 09/01/2022 1052   O2SAT 99.1 09/01/2022 1052     Coagulation Profile: No results for input(s): "INR", "PROTIME" in the last 168 hours.   Cardiac Enzymes: No results for input(s): "CKTOTAL", "CKMB", "CKMBINDEX", "TROPONINI" in the last 168 hours.  HbA1C: Hgb A1c MFr Bld  Date/Time Value Ref Range Status  07/04/2017 12:19 PM 5.4 4.8 - 5.6 % Final    Comment:    (NOTE) Pre diabetes:          5.7%-6.4% Diabetes:              >6.4% Glycemic control for   <7.0% adults with diabetes   11/20/2014 02:43 AM 5.3 4.0 - 6.0 % Final    CBG: No results for input(s): "GLUCAP" in the last 168 hours.  Review of Systems:   A 10 point review of  systems was performed and it is as noted above otherwise negative.  Allergies Allergies  Allergen Reactions   Aspirin Nausea And Vomiting and Swelling    Home Medications  Prior to Admission medications   Medication Sig Start Date End Date Taking? Authorizing Provider  albuterol (VENTOLIN HFA) 108 (90 Base) MCG/ACT inhaler Inhale 2 puffs into the lungs every 6 (six) hours as needed for wheezing or shortness of breath. 03/01/22  Yes Abernathy, Arlyss Repress, NP  amitriptyline (ELAVIL) 50 MG tablet Take 1 tablet (50 mg total) by mouth at bedtime. 07/23/17  Yes McNew, Ileene Hutchinson, MD  amLODipine (NORVASC) 10 MG tablet TAKE 1 TABLET (10 MG TOTAL) BY MOUTH DAILY. 03/23/22  Yes Abernathy, Arlyss Repress, NP  atorvastatin (LIPITOR) 20 MG tablet Take 1 tablet (20 mg total) by mouth daily. 04/14/22  Yes O'Neal, Ronnald Ramp, MD  docusate sodium (COLACE) 100 MG capsule TAKE 1 CAPSULE (100 MG TOTAL) BY MOUTH 2 (TWO) TIMES DAILY. 03/23/22  Yes Abernathy, Arlyss Repress, NP  folic acid (FOLVITE) 1 MG tablet TAKE 1 TABLET (1 MG  TOTAL) BY MOUTH DAILY. 07/13/22 10/11/22 Yes Abernathy, Arlyss Repress, NP  INVEGA SUSTENNA 234 MG/1.5ML injection Inject into the muscle. Every 4 weeks. 01/26/22  Yes [provider]  ipratropium-albuterol (DUONEB) 0.5-2.5 (3) MG/3ML SOLN Take 3 mLs by nebulization every 6 (six) hours as needed. 05/01/22  Yes Abernathy, Arlyss Repress, NP  levothyroxine (SYNTHROID) 137 MCG tablet TAKE 1 TABLET (137 MCG TOTAL) BY MOUTH DAILY BEFORE BREAKFAST. 07/13/22  Yes Abernathy, Alyssa, NP  lisinopril (ZESTRIL) 40 MG tablet TAKE 1 TABLET (40 MG TOTAL) BY MOUTH DAILY. 03/23/22  Yes Abernathy, Arlyss Repress, NP  meloxicam (MOBIC) 15 MG tablet Take 1 tablet (15 mg total) by mouth daily. In am with breakfast 07/06/22  Yes Abernathy, Alyssa, NP  methocarbamol (ROBAXIN) 750 MG tablet Take 1 tablet (750 mg total) by mouth every 8 (eight) hours as needed for muscle spasms. 07/06/22  Yes Abernathy, Alyssa, NP  nicotine (NICODERM CQ - DOSED IN MG/24 HOURS) 21 mg/24hr patch PLACE 1 PATCH (21 MG TOTAL) ONTO THE SKIN DAILY. 07/13/22  Yes Abernathy, Arlyss Repress, NP  pantoprazole (PROTONIX) 40 MG tablet Take 1 tablet (40 mg total) by mouth daily. 04/25/22  Yes Abernathy, Arlyss Repress, NP  propranolol (INDERAL) 10 MG tablet TAKE 1 TABLET (10 MG TOTAL) BY MOUTH 3 (THREE) TIMES DAILY. 07/13/22  Yes Abernathy, Arlyss Repress, NP  tamsulosin (FLOMAX) 0.4 MG CAPS capsule TAKE ONE CAPSULE BY MOUTH EVERY DAY 02/17/21  Yes Stoioff, Verna Czech, MD  traZODone (DESYREL) 100 MG tablet Take 100 mg by mouth at bedtime.   Yes [provider]  venlafaxine XR (EFFEXOR-XR) 75 MG 24 hr capsule Take 1 capsule (75 mg total) by mouth daily with breakfast. 07/24/17  Yes McNew, Ileene Hutchinson, MD  Vitamin D, Ergocalciferol, (DRISDOL) 1.25 MG (50000 UNIT) CAPS capsule TAKE 1 CAPSULE (50,000 UNITS TOTAL) BY MOUTH ONCE A WEEK. { 04/25/22  Yes Abernathy, Alyssa, NP  solifenacin (VESICARE) 10 MG tablet Take 10 mg by mouth daily.    [provider]   Scheduled Meds:  amitriptyline  25 mg Oral QHS    amLODipine  10 mg Oral Daily   arformoterol  15 mcg Nebulization BID   budesonide (PULMICORT) nebulizer solution  0.25 mg Nebulization BID   enoxaparin (LOVENOX) injection  0.5 mg/kg Subcutaneous Q24H   feeding supplement  237 mL Oral BID BM   folic acid  1 mg Oral Daily   furosemide  20 mg Oral BID   gabapentin  100 mg Oral TID   levothyroxine  150 mcg Oral QAC breakfast   multivitamin with minerals  1 tablet Oral Daily   nicotine  21 mg Transdermal Daily   paliperidone  234 mg Intramuscular Q28 days   pantoprazole  40 mg Oral Daily   polyethylene glycol  34 g Oral BID   propranolol  10 mg Oral TID   revefenacin  175 mcg Nebulization Daily   sodium chloride flush  3 mL Intravenous Q12H   tamsulosin  0.4 mg Oral Daily   thiamine  100 mg Oral Daily   traZODone  100 mg Oral QHS   venlafaxine XR  75 mg Oral Q breakfast   Continuous Infusions: PRN Meds:.Reviewed    Level 2 follow-up    Discussion: From our standpoint, the patient may be discharged to SNF when bed available.  He needs mobilization otherwise he will continue to have issues with atelectasis and fluctuations on his O2 needs.  If his current nebulization treatment medications are not available at SNF these can be switched to DuoNeb 4 times a day and Pulmicort 0.25 mg twice a day.  Because of his tar dive dyskinesia, the patient cannot coordinate use of metered-dose inhalers or dry powder inhalers.  He needs to continue nebulizer treatments.  Pulmonary will sign off, please reconsult as necessary.   Gailen Shelter, MD Advanced Bronchoscopy PCCM [facility]    *This note was dictated using voice recognition software/Dragon.  Despite best efforts to proofread, errors can occur which can change the meaning. Any transcriptional errors that result from this process are unintentional and may not be fully corrected at the time of dictation.

## 2022-09-16 DIAGNOSIS — N179 Acute kidney failure, unspecified: Secondary | ICD-10-CM | POA: Diagnosis not present

## 2022-09-16 DIAGNOSIS — M5416 Radiculopathy, lumbar region: Secondary | ICD-10-CM | POA: Diagnosis not present

## 2022-09-16 DIAGNOSIS — W19XXXA Unspecified fall, initial encounter: Secondary | ICD-10-CM | POA: Diagnosis not present

## 2022-09-16 DIAGNOSIS — J189 Pneumonia, unspecified organism: Secondary | ICD-10-CM | POA: Diagnosis not present

## 2022-09-16 MED ORDER — SUMATRIPTAN SUCCINATE 6 MG/0.5ML ~~LOC~~ SOLN
6.0000 mg | Freq: Once | SUBCUTANEOUS | Status: DC
  Filled 2022-09-16: qty 0.5

## 2022-09-16 MED ORDER — NAPROXEN 250 MG PO TABS: 250.0000 mg | ORAL_TABLET | Freq: Once | ORAL | Status: DC | PRN

## 2022-09-16 NOTE — Progress Notes (Addendum)
Progress Note   Patient: Christopher Mason WUJ:[id number] DOB: 1960/06/18 DOA: 08/16/2022     30 DOS: the patient was seen and examined on 09/16/2022   Brief hospital course:  62 y.o. male with medical history significant of COPD, schizophrenia, multiple previous spinal surgery, hypothyroidism, bipolar disorder, tardive dyskinesia, who presented to the ED with complaints of a fall. He notes that his left leg weakness and numbness started as left hip pain a few days ago. He has had multiple falls due to the weakness. He denies any other focal weakness. He endorsed increased cough over the last few days in addition to increased shortness of breath.  Patient had steroid injection on 4/26.  5/8.  Still having a lot of back pain. PT recommending SNF 5/9.  Patient able to come down to 2 L of oxygen.  Still having weakness in his left leg. 5/10.  ABG showing a pO2 of 121. Patient remained in the hospital , weaning of oxygen.  Complaining of ongoing pain. Abdominal distention and constipation improved. 5/15, underwent microdiscectomy with some clinical improvement. 5/17, needs skilled nursing facility. 5/18-19 - O2 needs rising again, not wheezing 5/20 - IV iron infusion, O2 down 5 >> 3 L/min today 5/21 - started low dose gabapentin for persistent LLE radicular pain, not tolerating OOB activity 5/22-5/25: stable, awaiting rehab. Will attempt to wean O2  Assessment and Plan: Lumbar radiculopathy, acute on chronic  bilateral leg weakness Patient with disc herniation L3/4. IR performed steroid injection 4/26.   No improvement in weeks after injection, still unable to ambulate 2/2 pain.  Neurosurgery performed microdiscectomy 5/15, drain removed 5/19.  - ongoing PT/OT - recommending SNF. TOC engaged - analgesia PRN  - gabapentin, norco - neurosurgery following, appreciate your care - bowel regimen- last BM was 5/23 - OOB as much as possible  COPD-  No baseline O2 use, per patient. Has been  stable around 2-3L last several days - Continue bronchodilator therapy.   - Keep on oxygen to keep saturation more than 88%. - pulmonology following, appreciate your recs 5/19 - repeat chest xray given rising O2 needs 4 >> 5 l/min (had been weaned to 2 l/min) 5/21: on 1-2L. Wean as tolerated. Denies respiratory symptoms.   Iron deficiency anemia -- iron 24, ferritin 18. IV iron infusion 5/20 Repeat IV iron 5/22 Monitor CBC  CAP- stable. Completed antibiotic and steroid course inpatient. Seen by pulmonology pre-operatively. - Continue nebulizer treatments.  AKI- resolved Creatinine 1.79 on presentation and down to 0.91.  Obesity (BMI 30-39.9) BMI 38.78 Complicates overall care and prognosis.  Recommend lifestyle modifications including physical activity and diet for weight loss and overall long-term health.  Anasarca Switched Lasix to oral, on 20 mg BID.   Echocardiogram shows normal EF.   Clinically improved.  Fatigue- Increased levothyroxine with TSH being elevated above 14 5/14.  Free T4 is normal. - recheck TSH in about 3 weeks  Schizophrenia (HCC) - Continue home amitriptyline, Invega and venlafaxine  HTN (hypertension) Blood pressure stable.  Continue propranolol and amlodipine  Patient is stable to transfer to SNF when bed available.     Subjective: patient reports doing ok today. No complaints.   Physical Exam: Vitals:   09/15/22 1657 09/15/22 2005 09/15/22 2317 09/16/22 0511  BP: (!) 136/96  132/70   Pulse: 90  77   Resp: 15  19   Temp: 97.9 F (36.6 C)  98.2 F (36.8 C)   TempSrc:      SpO2: 90% 92%  91%   Weight:    110 kg  Height:       General exam: awake, alert, no acute distress, obese, chronically ill appearing HEENT: nasal cannula in place, dry mucus membranes, hearing grossly normal.  Respiratory system: CTAB, no wheezes of rhonchi, diminished bases, normal respiratory effort at rest. On 2 L/min [place] O2 Cardiovascular system: normal S1/S2, RRR,  no pedal edema.   Central nervous system: A&O x 3. no gross focal neurologic deficits, dysarthria.  Extremities: no edema, normal tone. Scar tissue to thigh/knee.  Skin: dry, intact, normal temperature Psychiatry: flat mood, congruent affect   Data Reviewed: CBC    Component Value Date/Time   WBC 6.7 09/14/2022 0449   RBC 3.49 (L) 09/14/2022 0449   HGB 9.8 (L) 09/14/2022 0449   HGB 13.9 03/15/2022 1110   HCT 32.8 (L) 09/14/2022 0449   HCT 42.7 03/15/2022 1110   PLT 283 09/14/2022 0449   PLT 246 03/15/2022 1110   MCV 94.0 09/14/2022 0449   MCV 94 03/15/2022 1110   MCH 28.1 09/14/2022 0449   MCHC 29.9 (L) 09/14/2022 0449   RDW 15.5 09/14/2022 0449   RDW 13.6 03/15/2022 1110   LYMPHSABS 0.8 08/17/2022 0443   LYMPHSABS 1.5 03/15/2022 1110   MONOABS 0.6 08/17/2022 0443   EOSABS 0.0 08/17/2022 0443   EOSABS 0.3 03/15/2022 1110   BASOSABS 0.0 08/17/2022 0443   BASOSABS 0.1 03/15/2022 1110   Family Communication: None at bedside  Disposition: Status is: Inpatient Remains inpatient appropriate because: Unsafe discharge disposition plan.  Planned Discharge Destination: Skilled nursing facility.    Time spent: 30 minutes.  Author: Leeroy Bock, MD 09/16/2022 7:44 AM  For on call review [email or web address].

## 2022-09-17 DIAGNOSIS — M5416 Radiculopathy, lumbar region: Secondary | ICD-10-CM | POA: Diagnosis not present

## 2022-09-17 DIAGNOSIS — J189 Pneumonia, unspecified organism: Secondary | ICD-10-CM | POA: Diagnosis not present

## 2022-09-17 DIAGNOSIS — N179 Acute kidney failure, unspecified: Secondary | ICD-10-CM | POA: Diagnosis not present

## 2022-09-17 DIAGNOSIS — W19XXXA Unspecified fall, initial encounter: Secondary | ICD-10-CM | POA: Diagnosis not present

## 2022-09-17 NOTE — Progress Notes (Signed)
Progress Note   Patient: Christopher Mason ZOX:[id number] DOB: [birth year] DOA: 08/16/2022     31 DOS: the patient was seen and examined on 09/17/2022   Brief hospital course:  62 y.o. male with medical history significant of COPD, schizophrenia, multiple previous spinal surgery, hypothyroidism, bipolar disorder, tardive dyskinesia, who presented to the ED with complaints of a fall. He notes that his left leg weakness and numbness started as left hip pain a few days ago. He has had multiple falls due to the weakness. He denies any other focal weakness. He endorsed increased cough over the last few days in addition to increased shortness of breath.  Patient had steroid injection on 4/26.  5/8.  Still having a lot of back pain. PT recommending SNF 5/9.  Patient able to come down to 2 L of oxygen.  Still having weakness in his left leg. 5/10.  ABG showing a pO2 of 121. Patient remained in the hospital , weaning of oxygen.  Complaining of ongoing pain. Abdominal distention and constipation improved. 5/15, underwent microdiscectomy with some clinical improvement. 5/17, needs skilled nursing facility. 5/18-19 - O2 needs rising again, not wheezing 5/20 - IV iron infusion, O2 down 5 >> 3 L/min today 5/21 - started low dose gabapentin for persistent LLE radicular pain, not tolerating OOB activity 5/22-5/26: stable, awaiting rehab.  Assessment and Plan: Lumbar radiculopathy, acute on chronic  bilateral leg weakness Patient with disc herniation L3/4. IR performed steroid injection 4/26.   No improvement in weeks after injection, still unable to ambulate 2/2 pain.  Neurosurgery performed microdiscectomy 5/15, drain removed 5/19.  - ongoing PT/OT - recommending SNF. TOC engaged - analgesia PRN  - gabapentin, norco - neurosurgery following, appreciate your care - bowel regimen- last BM was 5/23 - OOB as much as possible  COPD-  No baseline O2 use, per patient. Has been stable around 2-3L last  several days - Continue bronchodilator therapy.   - Keep on oxygen to keep saturation more than 88%. - pulmonology following, appreciate your recs 5/19 - repeat chest xray given rising O2 needs 4 >> 5 l/min  Iron deficiency anemia -- iron 24, ferritin 18. IV iron infusion 5/20 Repeat IV iron 5/22 Monitor CBC  CAP- stable. Completed antibiotic and steroid course inpatient. Seen by pulmonology pre-operatively. - Continue nebulizer treatments.  AKI- resolved Creatinine 1.79 on presentation and down to 0.91.  Obesity (BMI 30-39.9) BMI 38.78 Complicates overall care and prognosis.  Recommend lifestyle modifications including physical activity and diet for weight loss and overall long-term health.  Anasarca Switched Lasix to oral, on 20 mg BID.   Echocardiogram shows normal EF.   Clinically improved.  Fatigue- Increased levothyroxine with TSH being elevated above 14 5/14.  Free T4 is normal. - recheck TSH in about 3 weeks  Schizophrenia (HCC) - Continue home amitriptyline, Invega and venlafaxine  HTN (hypertension) Blood pressure stable.  Continue propranolol and amlodipine  Patient is stable to transfer to SNF when bed available.     Subjective: patient reports doing ok today. No new complaints.   Physical Exam: Vitals:   09/16/22 2003 09/16/22 2333 09/16/22 2347 09/17/22 0500  BP:  137/78    Pulse:  79    Resp:  (!) 22    Temp:  99 F (37.2 C)    TempSrc:      SpO2: 94% (!) 88% 91%   Weight:    106.4 kg  Height:       General exam: awake, alert, no  acute distress, obese, chronically ill appearing HEENT: nasal cannula in place, dry mucus membranes, hearing grossly normal.  Respiratory system: CTAB, no wheezes of rhonchi, diminished bases, normal respiratory effort at rest. On 2 L/min [place] O2 Cardiovascular system: normal S1/S2, RRR, no pedal edema.   Central nervous system: A&O x 3. no gross focal neurologic deficits, dysarthria.  Extremities: no edema, normal tone.  Scar tissue to thigh/knee.  Skin: dry, intact, normal temperature Psychiatry: flat mood, congruent affect   Data Reviewed: CBC    Component Value Date/Time   WBC 6.7 09/14/2022 0449   RBC 3.49 (L) 09/14/2022 0449   HGB 9.8 (L) 09/14/2022 0449   HGB 13.9 03/15/2022 1110   HCT 32.8 (L) 09/14/2022 0449   HCT 42.7 03/15/2022 1110   PLT 283 09/14/2022 0449   PLT 246 03/15/2022 1110   MCV 94.0 09/14/2022 0449   MCV 94 03/15/2022 1110   MCH 28.1 09/14/2022 0449   MCHC 29.9 (L) 09/14/2022 0449   RDW 15.5 09/14/2022 0449   RDW 13.6 03/15/2022 1110   LYMPHSABS 0.8 08/17/2022 0443   LYMPHSABS 1.5 03/15/2022 1110   MONOABS 0.6 08/17/2022 0443   EOSABS 0.0 08/17/2022 0443   EOSABS 0.3 03/15/2022 1110   BASOSABS 0.0 08/17/2022 0443   BASOSABS 0.1 03/15/2022 1110   Family Communication: None at bedside  Disposition: Status is: Inpatient Remains inpatient appropriate because: Unsafe discharge disposition plan.  Planned Discharge Destination: Skilled nursing facility.    Time spent: 30 minutes.  Author: Leeroy Bock, MD 09/17/2022 7:35 AM  For on call review [email or web address].

## 2022-09-18 DIAGNOSIS — M5416 Radiculopathy, lumbar region: Secondary | ICD-10-CM | POA: Diagnosis not present

## 2022-09-18 DIAGNOSIS — N179 Acute kidney failure, unspecified: Secondary | ICD-10-CM | POA: Diagnosis not present

## 2022-09-18 DIAGNOSIS — J189 Pneumonia, unspecified organism: Secondary | ICD-10-CM | POA: Diagnosis not present

## 2022-09-18 DIAGNOSIS — W19XXXA Unspecified fall, initial encounter: Secondary | ICD-10-CM | POA: Diagnosis not present

## 2022-09-18 LAB — CBC
HCT: 34.9 % — ABNORMAL LOW (ref 39.0–52.0)
Hemoglobin: 10.6 g/dL — ABNORMAL LOW (ref 13.0–17.0)
MCH: 28.1 pg (ref 26.0–34.0)
MCHC: 30.4 g/dL (ref 30.0–36.0)
MCV: 92.6 fL (ref 80.0–100.0)
Platelets: 276 10*3/uL (ref 150–400)
RBC: 3.77 MIL/uL — ABNORMAL LOW (ref 4.22–5.81)
RDW: 15.9 % — ABNORMAL HIGH (ref 11.5–15.5)
WBC: 9.3 10*3/uL (ref 4.0–10.5)
nRBC: 0 % (ref 0.0–0.2)

## 2022-09-18 LAB — COMPREHENSIVE METABOLIC PANEL
ALT: 20 U/L (ref 0–44)
AST: 16 U/L (ref 15–41)
Albumin: 3.2 g/dL — ABNORMAL LOW (ref 3.5–5.0)
Alkaline Phosphatase: 76 U/L (ref 38–126)
Anion gap: 6 (ref 5–15)
BUN: 25 mg/dL — ABNORMAL HIGH (ref 8–23)
CO2: 30 mmol/L (ref 22–32)
Calcium: 9.1 mg/dL (ref 8.9–10.3)
Chloride: 98 mmol/L (ref 98–111)
Creatinine, Ser: 0.77 mg/dL (ref 0.61–1.24)
GFR, Estimated: >60 mL/min (ref >60)
Glucose, Bld: 99 mg/dL (ref 70–99)
Potassium: 4.1 mmol/L (ref 3.5–5.1)
Sodium: 134 mmol/L — ABNORMAL LOW (ref 135–145)
Total Bilirubin: 0.3 mg/dL (ref 0.3–1.2)
Total Protein: 7 g/dL (ref 6.5–8.1)

## 2022-09-18 NOTE — Plan of Care (Signed)
Problem: Education: Goal: Knowledge of General Education information will improve Description: Including pain rating scale, medication(s)/side effects and non-pharmacologic comfort measures 09/18/2022 1144 by Frann Rider D, LPN Outcome: Progressing 09/18/2022 1144 by Frann Rider D, LPN Outcome: Progressing   Problem: Health Behavior/Discharge Planning: Goal: Ability to manage health-related needs will improve 09/18/2022 1144 by Frann Rider D, LPN Outcome: Progressing 09/18/2022 1144 by Frann Rider D, LPN Outcome: Progressing   Problem: Clinical Measurements: Goal: Ability to maintain clinical measurements within normal limits will improve 09/18/2022 1144 by Frann Rider D, LPN Outcome: Progressing 09/18/2022 1144 by Frann Rider D, LPN Outcome: Progressing Goal: Will remain free from infection 09/18/2022 1144 by Frann Rider D, LPN Outcome: Progressing 09/18/2022 1144 by Frann Rider D, LPN Outcome: Progressing Goal: Diagnostic test results will improve 09/18/2022 1144 by Frann Rider D, LPN Outcome: Progressing 09/18/2022 1144 by Frann Rider D, LPN Outcome: Progressing Goal: Respiratory complications will improve 09/18/2022 1144 by Frann Rider D, LPN Outcome: Progressing 09/18/2022 1144 by Frann Rider D, LPN Outcome: Progressing Goal: Cardiovascular complication will be avoided 09/18/2022 1144 by Frann Rider D, LPN Outcome: Progressing 09/18/2022 1144 by Frann Rider D, LPN Outcome: Progressing   Problem: Activity: Goal: Risk for activity intolerance will decrease 09/18/2022 1144 by Frann Rider D, LPN Outcome: Progressing 09/18/2022 1144 by Frann Rider D, LPN Outcome: Progressing   Problem: Nutrition: Goal: Adequate nutrition will be maintained 09/18/2022 1144 by Frann Rider D, LPN Outcome: Progressing 09/18/2022 1144 by Frann Rider D, LPN Outcome: Progressing   Problem: Coping: Goal: Level  of anxiety will decrease 09/18/2022 1144 by Frann Rider D, LPN Outcome: Progressing 09/18/2022 1144 by Frann Rider D, LPN Outcome: Progressing   Problem: Elimination: Goal: Will not experience complications related to bowel motility 09/18/2022 1144 by Frann Rider D, LPN Outcome: Progressing 09/18/2022 1144 by Frann Rider D, LPN Outcome: Progressing Goal: Will not experience complications related to urinary retention 09/18/2022 1144 by Frann Rider D, LPN Outcome: Progressing 09/18/2022 1144 by Frann Rider D, LPN Outcome: Progressing   Problem: Pain Managment: Goal: General experience of comfort will improve 09/18/2022 1144 by Frann Rider D, LPN Outcome: Progressing 09/18/2022 1144 by Frann Rider D, LPN Outcome: Progressing   Problem: Safety: Goal: Ability to remain free from injury will improve 09/18/2022 1144 by Frann Rider D, LPN Outcome: Progressing 09/18/2022 1144 by Frann Rider D, LPN Outcome: Progressing   Problem: Skin Integrity: Goal: Risk for impaired skin integrity will decrease 09/18/2022 1144 by Frann Rider D, LPN Outcome: Progressing 09/18/2022 1144 by Frann Rider D, LPN Outcome: Progressing   Problem: Education: Goal: Ability to verbalize activity precautions or restrictions will improve 09/18/2022 1144 by Frann Rider D, LPN Outcome: Progressing 09/18/2022 1144 by Frann Rider D, LPN Outcome: Progressing   Problem: Activity: Goal: Ability to avoid complications of mobility impairment will improve 09/18/2022 1144 by Frann Rider D, LPN Outcome: Progressing 09/18/2022 1144 by Frann Rider D, LPN Outcome: Progressing Goal: Ability to tolerate increased activity will improve 09/18/2022 1144 by Frann Rider D, LPN Outcome: Progressing 09/18/2022 1144 by Frann Rider D, LPN Outcome: Progressing Goal: Will remain free from falls 09/18/2022 1144 by Frann Rider D, LPN Outcome:  Progressing 09/18/2022 1144 by Frann Rider D, LPN Outcome: Progressing   Problem: Bowel/Gastric: Goal: Gastrointestinal status for postoperative course will improve 09/18/2022 1144 by Frann Rider D, LPN Outcome: Progressing 09/18/2022 1144 by Frann Rider D, LPN Outcome: Progressing   Problem: Pain Management: Goal: Pain level will decrease 09/18/2022 1144 by Frann Rider D, LPN Outcome: Progressing  09/18/2022 1144 by Frann Rider D, LPN Outcome: Progressing

## 2022-09-18 NOTE — Progress Notes (Signed)
PT refused mobility at noon. Pt expressed he does not want to get out of bed now and wanted to rest.

## 2022-09-18 NOTE — Progress Notes (Signed)
Progress Note   Patient: Christopher Mason ZOX:[id number] DOB: 1960/10/20 DOA: 08/16/2022     32 DOS: the patient was seen and examined on 09/18/2022   Brief hospital course:  62 y.o. male with medical history significant of COPD, schizophrenia, multiple previous spinal surgery, hypothyroidism, bipolar disorder, tardive dyskinesia, who presented to the ED with complaints of a fall. He notes that his left leg weakness and numbness started as left hip pain a few days ago. He has had multiple falls due to the weakness. He denies any other focal weakness. He endorsed increased cough over the last few days in addition to increased shortness of breath.  Patient had steroid injection on 4/26.  5/8.  Still having a lot of back pain. PT recommending SNF 5/9.  Patient able to come down to 2 L of oxygen.  Still having weakness in his left leg. 5/10.  ABG showing a pO2 of 121. Patient remained in the hospital , weaning of oxygen.  Complaining of ongoing pain. Abdominal distention and constipation improved. 5/15, underwent microdiscectomy with some clinical improvement. 5/17, needs skilled nursing facility. 5/18-19 - O2 needs rising again, not wheezing 5/20 - IV iron infusion, O2 down 5 >> 3 L/min today 5/21 - started low dose gabapentin for persistent LLE radicular pain, not tolerating OOB activity 5/22-5/27: stable, awaiting rehab.  Assessment and Plan: Lumbar radiculopathy, acute on chronic  bilateral leg weakness Patient with disc herniation L3/4. IR performed steroid injection 4/26.   No improvement in weeks after injection, still unable to ambulate 2/2 pain.  Neurosurgery performed microdiscectomy 5/15, drain removed 5/19.  - ongoing PT/OT - recommending SNF. TOC engaged - analgesia PRN  - gabapentin, norco - neurosurgery following, appreciate your care - bowel regimen- last BM was 5/23 - OOB as much as possible  COPD-  No baseline O2 use, per patient. Has been stable around 2-3L last  several days - Continue bronchodilator therapy.   - Keep on oxygen to keep saturation more than 88%. - pulmonology following, appreciate your recs 5/19 - repeat chest xray given rising O2 needs 4 >> 5 l/min  Iron deficiency anemia -- iron 24, ferritin 18. IV iron infusion 5/20 Repeat IV iron 5/22 Monitor CBC  CAP- stable. Completed antibiotic and steroid course inpatient. Seen by pulmonology pre-operatively. - Continue nebulizer treatments.  AKI- resolved Creatinine 1.79 on presentation and down to 0.91.  Obesity (BMI 30-39.9) BMI 38.78 Complicates overall care and prognosis.  Recommend lifestyle modifications including physical activity and diet for weight loss and overall long-term health.  Anasarca Switched Lasix to oral, on 20 mg BID.   Echocardiogram shows normal EF.   Clinically improved.  Fatigue- Increased levothyroxine with TSH being elevated above 14 5/14.  Free T4 is normal. - recheck TSH in about 3 weeks  Schizophrenia (HCC) - Continue home amitriptyline, Invega and venlafaxine  HTN (hypertension) Blood pressure stable.  Continue propranolol and amlodipine  Patient is stable to transfer to SNF when bed available.     Subjective: patient reports doing well today. Denies complaints.   Physical Exam: Vitals:   09/17/22 1529 09/17/22 2041 09/17/22 2300 09/18/22 0727  BP: (!) 127/95  124/82 125/75  Pulse: 87  69 81  Resp: 19  16 19   Temp: 97.8 F (36.6 C)  97.7 F (36.5 C) 98.3 F (36.8 C)  TempSrc:      SpO2: 92% 91% 93% 91%  Weight:      Height:       General exam:  awake, alert, no acute distress, obese, chronically ill appearing HEENT: nasal cannula in place, dry mucus membranes, hearing grossly normal.  Respiratory system: CTAB, no wheezes of rhonchi, diminished bases, normal respiratory effort at rest. On 2 L/min [place] O2 Cardiovascular system: normal S1/S2, RRR, no pedal edema.   Central nervous system: A&O x 3. no gross focal neurologic deficits,  dysarthria.  Extremities: no edema, normal tone. Scar tissue to thigh/knee.  Skin: dry, intact, normal temperature Psychiatry: flat mood, congruent affect   Data Reviewed: CBC    Component Value Date/Time   WBC 6.7 09/14/2022 0449   RBC 3.49 (L) 09/14/2022 0449   HGB 9.8 (L) 09/14/2022 0449   HGB 13.9 03/15/2022 1110   HCT 32.8 (L) 09/14/2022 0449   HCT 42.7 03/15/2022 1110   PLT 283 09/14/2022 0449   PLT 246 03/15/2022 1110   MCV 94.0 09/14/2022 0449   MCV 94 03/15/2022 1110   MCH 28.1 09/14/2022 0449   MCHC 29.9 (L) 09/14/2022 0449   RDW 15.5 09/14/2022 0449   RDW 13.6 03/15/2022 1110   LYMPHSABS 0.8 08/17/2022 0443   LYMPHSABS 1.5 03/15/2022 1110   MONOABS 0.6 08/17/2022 0443   EOSABS 0.0 08/17/2022 0443   EOSABS 0.3 03/15/2022 1110   BASOSABS 0.0 08/17/2022 0443   BASOSABS 0.1 03/15/2022 1110   Family Communication: None at bedside  Disposition: Status is: Inpatient Remains inpatient appropriate because: Unsafe discharge disposition plan.  Planned Discharge Destination: Skilled nursing facility.    Time spent: 30 minutes.  Author: Leeroy Bock, MD 09/18/2022 7:46 AM  For on call review [email or web address].

## 2022-09-18 NOTE — Progress Notes (Signed)
Pt refused mobility this morning.

## 2022-09-18 NOTE — TOC Progression Note (Signed)
Transition of Care [facility]) - Progression Note    Patient Details  Name: Christopher Mason MRN: [id number] Date of Birth: November 25, 1960  Transition of Care [facility]) CM/SW Contact  Marlowe Sax, RN Phone Number: 09/18/2022, 11:59 AM  Clinical Narrative:   Anticipate DC to [facility] on June 1st once gets check for SS to sign over      Barriers to Discharge: Continued Medical Work up  Expected Discharge Plan and Services   Discharge Planning Services: CM Consult                     DME Arranged: N/A DME Agency: NA                   Social Determinants of Health (SDOH) Interventions SDOH Screenings   Food Insecurity: No Food Insecurity (08/16/2022)  Housing: Low Risk  (08/16/2022)  Transportation Needs: No Transportation Needs (08/16/2022)  Utilities: Not At Risk (08/16/2022)  Alcohol Screen: Low Risk  (09/06/2021)  Depression (PHQ2-9): Low Risk  (09/06/2021)  Tobacco Use: High Risk (09/07/2022)    Readmission Risk Interventions     No data to display

## 2022-09-19 DIAGNOSIS — M5416 Radiculopathy, lumbar region: Secondary | ICD-10-CM | POA: Diagnosis not present

## 2022-09-19 DIAGNOSIS — W19XXXA Unspecified fall, initial encounter: Secondary | ICD-10-CM | POA: Diagnosis not present

## 2022-09-19 DIAGNOSIS — J189 Pneumonia, unspecified organism: Secondary | ICD-10-CM | POA: Diagnosis not present

## 2022-09-19 DIAGNOSIS — N179 Acute kidney failure, unspecified: Secondary | ICD-10-CM | POA: Diagnosis not present

## 2022-09-19 NOTE — Plan of Care (Signed)
  Problem: Education: Goal: Knowledge of General Education information will improve Description: Including pain rating scale, medication(s)/side effects and non-pharmacologic comfort measures Outcome: Progressing   Problem: Health Behavior/Discharge Planning: Goal: Ability to manage health-related needs will improve Outcome: Progressing   Problem: Clinical Measurements: Goal: Ability to maintain clinical measurements within normal limits will improve Outcome: Progressing Goal: Will remain free from infection Outcome: Progressing Goal: Diagnostic test results will improve Outcome: Progressing Goal: Respiratory complications will improve Outcome: Progressing Goal: Cardiovascular complication will be avoided Outcome: Progressing   Problem: Activity: Goal: Risk for activity intolerance will decrease Outcome: Progressing   Problem: Nutrition: Goal: Adequate nutrition will be maintained Outcome: Progressing   Problem: Coping: Goal: Level of anxiety will decrease Outcome: Progressing   Problem: Elimination: Goal: Will not experience complications related to bowel motility Outcome: Progressing Goal: Will not experience complications related to urinary retention Outcome: Progressing   Problem: Pain Managment: Goal: General experience of comfort will improve Outcome: Progressing   Problem: Safety: Goal: Ability to remain free from injury will improve Outcome: Progressing   Problem: Skin Integrity: Goal: Risk for impaired skin integrity will decrease Outcome: Progressing   Problem: Education: Goal: Ability to verbalize activity precautions or restrictions will improve Outcome: Progressing   Problem: Activity: Goal: Ability to avoid complications of mobility impairment will improve Outcome: Progressing Goal: Ability to tolerate increased activity will improve Outcome: Progressing Goal: Will remain free from falls Outcome: Progressing   Problem: Bowel/Gastric: Goal:  Gastrointestinal status for postoperative course will improve Outcome: Progressing   Problem: Pain Management: Goal: Pain level will decrease Outcome: Progressing   

## 2022-09-19 NOTE — Progress Notes (Signed)
Physical Therapy Treatment Patient Details Name: Christopher Mason MRN: [id number] DOB: [birth year] Today's Date: 09/19/2022   History of Present Illness [name] is a 61yoM withPMH: COPD, schizophrenia, spinal surgery, hypoTSH, BPD, tardive dyskinesia, who presents to the ED after falls.  MD assessment includes: COPD exacerbation, CAP, AKI, and LLE weakness. MRI impression includes: multilevel lumbar spondylosis, worst at L3-4, where a left subarticular disc extrusion results in compression of the traversing left L4 nerve root in the left lateral recess. New PT orders received 5/16 s/p left L3-4 microdiscectomy 5/15.    PT Comments    Pt in bed on entry, HOB at 40 degrees, breakfast tray completed. Pt agreeable to session, although he remains apprehensive and hypervigilant regarding the recliner in room- Thereasa Parkin reassures patient that he makes his own medical decisions and there is no Buyer, retail. By some miracle, pt reports no pain in back or leg on arrival, nor during any part of the session. Pt showing improved strength in legs with A/ROM. Pt agreeable to attempt EOB sitting, very poor tolerance all of last week. No pain at EOB. Pt able to rise to standing twice from elevated EOB, steady with RW. Pt very fatigued after transfers, session concluded at this time. Excellent turnaround in general activity tolerance compared to all of last week. Pt encouraged to keep his motivation aimed at regaining function so he can return to home to his [employer]. His friend has assisted with phone replacement since his phone breaking last week.      Recommendations for follow up therapy are one component of a multi-disciplinary discharge planning process, led by the attending physician.  Recommendations may be updated based on patient status, additional functional criteria and insurance authorization.  Follow Up Recommendations  Can patient physically be transported by private vehicle: No     Assistance Recommended at Discharge Frequent or constant Supervision/Assistance  Patient can return home with the following A lot of help with bathing/dressing/bathroom;Assistance with cooking/housework;Direct supervision/assist for medications management;Assist for transportation;Help with stairs or ramp for entrance;Two people to help with walking and/or transfers   Equipment Recommendations  Rolling walker (2 wheels);BSC/3in1;Wheelchair (measurements PT);Wheelchair cushion (measurements PT);Hospital bed    Recommendations for Other Services       Precautions / Restrictions Precautions Precautions: Fall;Back Precaution Booklet Issued: No Required Braces or Orthoses: Knee Immobilizer - Left Knee Immobilizer - Left: On when out of bed or walking Restrictions Weight Bearing Restrictions: No Other Position/Activity Restrictions: OK to use LLE KI as needed to the LLE     Mobility  Bed Mobility   Bed Mobility: Supine to Sit, Sit to Supine     Supine to sit: Min assist, Min guard (pre-emptively in anticipaton of recurrent pain issues, but none noted.) Sit to supine: Supervision   General bed mobility comments: no pain, mild dizziness which seems to improve with time.    Transfers Overall transfer level: Needs assistance Equipment used: Rolling walker (2 wheels) Transfers: Sit to/from Stand Sit to Stand: From elevated surface, Min guard           General transfer comment: tolerates standing at EOB 2x15sec, no LLE buckling    Ambulation/Gait Ambulation/Gait assistance:  (pt not agreeable, between dizziness and fatigue from other interventions.)                 Stairs             Wheelchair Mobility    Modified Rankin (Stroke Patients Only)  Balance                                            Cognition Arousal/Alertness: Awake/alert Behavior During Therapy: WFL for tasks assessed/performed Overall Cognitive Status: Within  Functional Limits for tasks assessed                                 General Comments: at baseline: always sleepy, moderate frustration tolerance, limited insight, low level anxiety, some short term memory difficulty regarding prior educational interventions.        Exercises General Exercises - Lower Extremity Short Arc Quad: AROM, Supine, 15 reps, Right Heel Slides: Strengthening, Both, Supine, 20 reps, AROM Other Exercises Other Exercises: manually resisted knee/hip extension supine x10 bilat (able to produce improved power this date bilat, however LLE remains much weaker than rt) Other Exercises: slight recliner short sitting to upright sitting in bed: BUE pulling on bilat bed rails, minA at T9 level: 5x5sec (excellent tolerance)    General Comments        Pertinent Vitals/Pain Pain Assessment Pain Assessment: No/denies pain    Home Living                          Prior Function            PT Goals (current goals can now be found in the care plan section) Acute Rehab PT Goals Patient Stated Goal: To walk better PT Goal Formulation: With patient Time For Goal Achievement: 09/21/22 Potential to Achieve Goals: Fair Progress towards PT goals: Progressing toward goals    Frequency    Min 3X/week      PT Plan Current plan remains appropriate    Co-evaluation              AM-PAC PT "6 Clicks" Mobility   Outcome Measure  Help needed turning from your back to your side while in a flat bed without using bedrails?: A Lot Help needed moving from lying on your back to sitting on the side of a flat bed without using bedrails?: A Lot Help needed moving to and from a bed to a chair (including a wheelchair)?: Total Help needed standing up from a chair using your arms (e.g., wheelchair or bedside chair)?: Total Help needed to walk in hospital room?: Total Help needed climbing 3-5 steps with a railing? : Total 6 Click Score: 8    End of  Session Equipment Utilized During Treatment: Oxygen Activity Tolerance: Patient limited by fatigue;No increased pain Patient left: with call bell/phone within reach;in bed;with bed alarm set   PT Visit Diagnosis: Muscle weakness (generalized) (M62.81);Difficulty in walking, not elsewhere classified (R26.2) Pain - Right/Left: Left Pain - part of body: Leg     Time: 0865-7846 PT Time Calculation (min) (ACUTE ONLY): 26 min  Charges:  $Therapeutic Exercise: 8-22 mins $Therapeutic Activity: 8-22 mins                    12:13 PM, 09/19/22 [name], PT, DPT Physical Therapist - [facility]  [phone] (ASCOM)   Tinslee Klare C 09/19/2022, 12:08 PM

## 2022-09-19 NOTE — Progress Notes (Signed)
Progress Note   Patient: Christopher Mason ZOX:[id number] DOB: 10-Oct-1960 DOA: 08/16/2022     33 DOS: the patient was seen and examined on 09/19/2022   Brief hospital course:  62 y.o. male with medical history significant of COPD, schizophrenia, multiple previous spinal surgery, hypothyroidism, bipolar disorder, tardive dyskinesia, who presented to the ED with complaints of a fall. He notes that his left leg weakness and numbness started as left hip pain a few days ago. He has had multiple falls due to the weakness. He denies any other focal weakness. He endorsed increased cough over the last few days in addition to increased shortness of breath.  Patient had steroid injection on 4/26.  5/8.  Still having a lot of back pain. PT recommending SNF 5/9.  Patient able to come down to 2 L of oxygen.  Still having weakness in his left leg. 5/10.  ABG showing a pO2 of 121. Patient remained in the hospital , weaning of oxygen.  Complaining of ongoing pain. Abdominal distention and constipation improved. 5/15, underwent microdiscectomy with some clinical improvement. 5/17, needs skilled nursing facility. 5/18-19 - O2 needs rising again, not wheezing 5/20 - IV iron infusion, O2 down 5 >> 3 L/min today 5/21 - started low dose gabapentin for persistent LLE radicular pain, not tolerating OOB activity 5/22-5/28: stable, awaiting rehab.  Assessment and Plan: Lumbar radiculopathy, acute on chronic  bilateral leg weakness Patient with disc herniation L3/4. IR performed steroid injection 4/26.   No improvement in weeks after injection, still unable to ambulate 2/2 pain.  Neurosurgery performed microdiscectomy 5/15, drain removed 5/19.  - ongoing PT/OT - recommending SNF. TOC engaged - analgesia PRN  - gabapentin, norco - neurosurgery following, appreciate your care - bowel regimen- last BM was 5/23 - OOB as much as possible  COPD-  No baseline O2 use, per patient. Has been stable around 2-3L last  several days - Continue bronchodilator therapy.   - Keep on oxygen to keep saturation more than 88%. - pulmonology following, appreciate your recs 5/19 - repeat chest xray given rising O2 needs 4 >> 5 l/min  Iron deficiency anemia -- iron 24, ferritin 18. IV iron infusion 5/20 Repeat IV iron 5/22 Monitor CBC  CAP- stable. Completed antibiotic and steroid course inpatient. Seen by pulmonology pre-operatively. - Continue nebulizer treatments.  AKI- resolved Creatinine 1.79 on presentation and down to 0.91.  Obesity (BMI 30-39.9) BMI 38.78 Complicates overall care and prognosis.  Recommend lifestyle modifications including physical activity and diet for weight loss and overall long-term health.  Anasarca Switched Lasix to oral, on 20 mg BID.   Echocardiogram shows normal EF.   Clinically improved.  Fatigue- Increased levothyroxine with TSH being elevated above 14 5/14.  Free T4 is normal. - recheck TSH in about 3 weeks  Schizophrenia (HCC) - Continue home amitriptyline, Invega and venlafaxine  HTN (hypertension) Blood pressure stable.  Continue propranolol and amlodipine  Patient is stable to transfer to SNF when bed available.     Subjective: patient reports doing well today. Denies complaints. He states he doesn't want to get out of bed. I discussed that he would need to work on getting out of bed to help his strength come back and help him play with his puppy when he gets home. He begrudgingly agreed to try.   Physical Exam: Vitals:   09/18/22 1528 09/18/22 2028 09/19/22 0022 09/19/22 0500  BP: 114/73  123/73   Pulse: 82  72   Resp: 19  16  Temp: 98.3 F (36.8 C)  98.6 F (37 C)   TempSrc: Oral     SpO2: 91% 93% 95%   Weight:    105 kg  Height:       General exam: awake, alert, no acute distress, obese, chronically ill appearing HEENT: nasal cannula in place, dry mucus membranes, hearing grossly normal.  Respiratory system: CTAB, no wheezes of rhonchi,  diminished bases, normal respiratory effort at rest. On 2 L/min [place] O2 Cardiovascular system: normal S1/S2, RRR, no pedal edema.   Central nervous system: A&O x 3. no gross focal neurologic deficits, dysarthria.  Extremities: no edema, normal tone. Scar tissue to thigh/knee.  Skin: dry, intact, normal temperature Psychiatry: flat mood, congruent affect   Data Reviewed: CBC    Component Value Date/Time   WBC 9.3 09/18/2022 0943   RBC 3.77 (L) 09/18/2022 0943   HGB 10.6 (L) 09/18/2022 0943   HGB 13.9 03/15/2022 1110   HCT 34.9 (L) 09/18/2022 0943   HCT 42.7 03/15/2022 1110   PLT 276 09/18/2022 0943   PLT 246 03/15/2022 1110   MCV 92.6 09/18/2022 0943   MCV 94 03/15/2022 1110   MCH 28.1 09/18/2022 0943   MCHC 30.4 09/18/2022 0943   RDW 15.9 (H) 09/18/2022 0943   RDW 13.6 03/15/2022 1110   LYMPHSABS 0.8 08/17/2022 0443   LYMPHSABS 1.5 03/15/2022 1110   MONOABS 0.6 08/17/2022 0443   EOSABS 0.0 08/17/2022 0443   EOSABS 0.3 03/15/2022 1110   BASOSABS 0.0 08/17/2022 0443   BASOSABS 0.1 03/15/2022 1110   Family Communication: None at bedside  Disposition: Status is: Inpatient Remains inpatient appropriate because: Unsafe discharge disposition plan.  Planned Discharge Destination: Skilled nursing facility.    Time spent: 30 minutes.  Author: Leeroy Bock, MD 09/19/2022 7:43 AM  For on call review [email or web address].

## 2022-09-19 NOTE — TOC Progression Note (Signed)
Transition of Care [facility]) - Progression Note    Patient Details  Name: Christopher Mason MRN: [id number] Date of Birth: [birth year]  [facility]) CM/SW Contact  Marlowe Sax, RN Phone Number: 09/19/2022, 2:52 PM  Clinical Narrative:   Spoke with [name] with [employer], he stated that his check actually does not come in until June 1st, I explained that he will DC the day he is able to pay.  He will call the bank and check the funding and let me know      Barriers to Discharge: Continued Medical Work up  Expected Discharge Plan and Services   Discharge Planning Services: CM Consult                     DME Arranged: N/A DME Agency: NA                   Social Determinants of Health (SDOH) Interventions SDOH Screenings   Food Insecurity: No Food Insecurity (08/16/2022)  Housing: Low Risk  (08/16/2022)  Transportation Needs: No Transportation Needs (08/16/2022)  Utilities: Not At Risk (08/16/2022)  Alcohol Screen: Low Risk  (09/06/2021)  Depression (PHQ2-9): Low Risk  (09/06/2021)  Tobacco Use: High Risk (09/07/2022)    Readmission Risk Interventions     No data to display

## 2022-09-19 NOTE — Progress Notes (Signed)
OT Cancellation Note  Patient Details Name: Christopher Mason MRN: [id number] DOB: [birth year]   Cancelled Treatment:    Reason Eval/Treat Not Completed: Patient declined, no reason specified. Pt had visitor and Tiny Mac (dog) in room. He reports fatigue from prior PT session, and asked for OT to attempt session at a later date. OT will attempt therapeutic session tomorrow.   Thresa Ross, OTS

## 2022-09-19 NOTE — TOC Progression Note (Addendum)
Transition of Care [facility]) - Progression Note    Patient Details  Name: Christopher Mason MRN: [id number] Date of Birth: Jul 10, 1960  Transition of Care [facility]) CM/SW Contact  Marlowe Sax, RN Phone Number: 09/19/2022, 11:06 AM  Clinical Narrative:   Spoke with the patient and he is planning to go to [facility] and understands that he will need to stay for 30 days and turn over his check , he stated that he has his check in place and is anticipating going to [facility], I called Tonya and asked if the bed is ready, she will run his meds and let me know  Tonya reached out to verify how the patient will pay, if he has his card  here at the hospital, his case worker with [employer] has his Card, he is trying to reach him, [name], his number is [phone], Adair Laundry is other worker [phone], I called [name] to see if he can bring the card He stated that yes he does have his Debit card, he stated that he would talk to his supervisor and see if they are able to do that and he will call me back in a few min and let me know    Barriers to Discharge: Continued Medical Work up  Expected Discharge Plan and Services   Discharge Planning Services: CM Consult                     DME Arranged: N/A DME Agency: NA                   Social Determinants of Health (SDOH) Interventions SDOH Screenings   Food Insecurity: No Food Insecurity (08/16/2022)  Housing: Low Risk  (08/16/2022)  Transportation Needs: No Transportation Needs (08/16/2022)  Utilities: Not At Risk (08/16/2022)  Alcohol Screen: Low Risk  (09/06/2021)  Depression (PHQ2-9): Low Risk  (09/06/2021)  Tobacco Use: High Risk (09/07/2022)    Readmission Risk Interventions     No data to display

## 2022-09-19 NOTE — Progress Notes (Signed)
   09/19/22 1400  Spiritual Encounters  Type of Visit Initial  Care provided to: Patient  Referral source Chaplain assessment (Chaplain rounding)  Reason for visit Routine spiritual support  OnCall Visit No  Spiritual Framework  Presenting Themes Meaning/purpose/sources of inspiration  Community/Connection Friend(s)  Interventions  Spiritual Care Interventions Made Established relationship of care and support;Compassionate presence;Reflective listening;Encouragement  Intervention Outcomes  Outcomes Connection to spiritual care;Awareness of support  Spiritual Care Plan  Spiritual Care Issues Still Outstanding No further spiritual care needs at this time (see row info)   Chaplain available for follow up as needed.

## 2022-09-20 DIAGNOSIS — M5416 Radiculopathy, lumbar region: Secondary | ICD-10-CM | POA: Diagnosis not present

## 2022-09-20 DIAGNOSIS — D509 Iron deficiency anemia, unspecified: Secondary | ICD-10-CM | POA: Insufficient documentation

## 2022-09-20 MED ORDER — BUDESONIDE 0.25 MG/2ML IN SUSP: 0.2500 mg | Freq: Two times a day (BID) | RESPIRATORY_TRACT | 0 refills | Status: DC

## 2022-09-20 MED ORDER — ADULT MULTIVITAMIN W/MINERALS CH: 1.0000 | ORAL_TABLET | Freq: Every day | ORAL | Status: AC

## 2022-09-20 MED ORDER — ENSURE ENLIVE PO LIQD: 237.0000 mL | Freq: Two times a day (BID) | ORAL | 12 refills | Status: DC

## 2022-09-20 MED ORDER — ARFORMOTEROL TARTRATE 15 MCG/2ML IN NEBU: 15.0000 ug | INHALATION_SOLUTION | Freq: Two times a day (BID) | RESPIRATORY_TRACT | 0 refills | Status: DC

## 2022-09-20 MED ORDER — AMITRIPTYLINE HCL 25 MG PO TABS: 25.0000 mg | ORAL_TABLET | Freq: Every day | ORAL | 0 refills | Status: AC

## 2022-09-20 MED ORDER — GABAPENTIN 100 MG PO CAPS: 100.0000 mg | ORAL_CAPSULE | Freq: Three times a day (TID) | ORAL | 0 refills | Status: DC

## 2022-09-20 MED ORDER — BISACODYL 10 MG RE SUPP: 10.0000 mg | Freq: Every day | RECTAL | 0 refills | Status: DC | PRN

## 2022-09-20 MED ORDER — LEVOTHYROXINE SODIUM 150 MCG PO TABS: 150.0000 ug | ORAL_TABLET | Freq: Every day | ORAL | 0 refills | Status: DC

## 2022-09-20 MED ORDER — HYDROCODONE-ACETAMINOPHEN 5-325 MG PO TABS: 1.0000 | ORAL_TABLET | Freq: Four times a day (QID) | ORAL | 0 refills | Status: DC | PRN

## 2022-09-20 MED ORDER — POLYETHYLENE GLYCOL 3350 17 G PO PACK: 34.0000 g | PACK | Freq: Two times a day (BID) | ORAL | 0 refills | Status: DC

## 2022-09-20 MED ORDER — VITAMIN B-1 100 MG PO TABS: 100.0000 mg | ORAL_TABLET | Freq: Every day | ORAL | 0 refills | Status: DC

## 2022-09-20 MED ORDER — REVEFENACIN 175 MCG/3ML IN SOLN: 175.0000 ug | Freq: Every day | RESPIRATORY_TRACT | 0 refills | Status: DC

## 2022-09-20 MED ORDER — FUROSEMIDE 20 MG PO TABS: 20.0000 mg | ORAL_TABLET | Freq: Two times a day (BID) | ORAL | 0 refills | Status: DC

## 2022-09-20 NOTE — Progress Notes (Deleted)
   REFERRING PHYSICIAN:  Sallyanne Kuster, [address] [place],  [place] [address]  DOS: 09/06/22 Left L3-L4 microdiscectomy   HISTORY OF PRESENT ILLNESS: Christopher Mason is approximately *** status post ***. Was given *** on discharge from the hospital.   He had significant pain and left leg weakness prior to surgery. He was discharged to SNF yesterday.      PHYSICAL EXAMINATION:  General: Patient is well developed, well nourished, calm, collected, and in no apparent distress.   NEUROLOGICAL:  General: In no acute distress.   Awake, alert, oriented to person, place, and time.  Pupils equal round and reactive to light.  Facial tone is symmetric.     Strength:            Side Iliopsoas Quads Hamstring PF DF EHL  R 5 5 5 5 5 5   L 5 5 5 5 5 5    Incision c/d/i   ROS (Neurologic):  Negative except as noted above  IMAGING: Nothing new to review.   ASSESSMENT/PLAN:  Christopher Mason is doing well s/p above surgery. Treatment options reviewed with patient and following plan made:   - I have advised the patient to lift up to 10 pounds until 6 weeks after surgery (follow up with Dr. Myer Haff).  - Reviewed wound care.  - No bending, twisting, or lifting.  - Continue on current medications including ***.  - Follow up as scheduled in 4 weeks and prn.   Advised to contact the office if any questions or concerns arise.  Drake Leach PA-C Department of neurosurgery

## 2022-09-20 NOTE — Plan of Care (Signed)
  Problem: Clinical Measurements: Goal: Cardiovascular complication will be avoided Outcome: Progressing   Problem: Activity: Goal: Risk for activity intolerance will decrease Outcome: Progressing   Problem: Nutrition: Goal: Adequate nutrition will be maintained Outcome: Progressing   Problem: Elimination: Goal: Will not experience complications related to bowel motility Outcome: Progressing Goal: Will not experience complications related to urinary retention Outcome: Progressing   Problem: Pain Managment: Goal: General experience of comfort will improve Outcome: Progressing   Problem: Safety: Goal: Ability to remain free from injury will improve Outcome: Progressing   Problem: Skin Integrity: Goal: Risk for impaired skin integrity will decrease Outcome: Progressing   

## 2022-09-20 NOTE — Hospital Course (Signed)
62 y.o. male with medical history significant of COPD, schizophrenia, multiple previous spinal surgery, hypothyroidism, bipolar disorder, tardive dyskinesia, who presented to the ED with complaints of a fall. He notes that his left leg weakness and numbness started as left hip pain a few days ago. He has had multiple falls due to the weakness. He denies any other focal weakness. He endorsed increased cough over the last few days in addition to increased shortness of breath.  Patient had steroid injection on 4/26.   4/24: to ED 4/25: initial neurosurgery consult, planning epidural steroid injection, no surgery  4/26: successful epidural injection on the left L3-L4.  4/30: neurosurgery saw pt today, If pt continues to have substantial weakness that precludes walking, would consider surgical intervention as soon as 2 weeks after his epidural injection  5/8.  Still having a lot of back pain. PT recommending SNF 5/9.  Patient able to come down to 2 L of oxygen.  Still having weakness in his left leg. 5/10.  ABG showing a pO2 of 121. Patient remained in the hospital , weaning of oxygen.  Complaining of ongoing pain. Abdominal distention and constipation improved. 5/14: pulmonology consult for COPD 5/15, underwent microdiscectomy with some clinical improvement. 5/17, needs skilled nursing facility. 5/18-19 - O2 needs rising again, not wheezing 5/20 - IV iron infusion, O2 down 5 >> 3 L/min today 5/21 - started low dose gabapentin for persistent LLE radicular pain, not tolerating OOB activity 5/22-5/28: stable, awaiting rehab.  Consultants:  Neurosurgery Interventional Radiology  Pulmonology   Procedures: 08/18/22 epidural injection on the left L3-L4.  09/06/22 microdiscectomy       ASSESSMENT & PLAN:   Principal Problem:   Lumbar radiculopathy, acute Active Problems:   Left leg weakness   Acute respiratory failure with hypoxia (HCC)   COPD exacerbation (HCC)   CAP (community acquired  pneumonia)   AKI (acute kidney injury) (HCC)   HTN (hypertension)   Hypothyroidism   COPD, group C, by GOLD 2017 classification (HCC)   Hyponatremia   Schizophrenia (HCC)   Chronic pain of left knee   Fatigue   Anasarca   Constipation   Obesity (BMI 30-39.9)   Lumbar radiculopathy   Atelectasis of both lungs   Fall   Pneumonia of left lower lobe due to infectious organism  Lumbar radiculopathy, acute on chronic  bilateral leg weakness Patient with disc herniation L3/4.  IR performed steroid injection 4/26.   No improvement in weeks after injection, still unable to ambulate 2/2 pain.  Neurosurgery performed microdiscectomy 5/15, drain removed 5/19.  ongoing PT/OT, recommending SNF. TOC engaged analgesia PRN w/ gabapentin, norco neurosurgery following, appreciate your care bowel regimen- last BM was 5/23 OOB as much as possible Patient is stable to transfer to SNF when bed available.    COPD-  No baseline O2 use, per patient.  Has been stable around 2-3L  Continue bronchodilator therapy.   Keep on oxygen to keep saturation more than 88%. pulmonology following, appreciate your recs - avoid steroids unless wheezing   Iron deficiency anemia  iron 24, ferritin 18. IV iron infusion 5/20 Repeat IV iron 5/22 Monitor CBC   CAP- stable.  Completed antibiotic and steroid course inpatient.  Seen by pulmonology pre-operatively. Continue nebulizer treatments.   AKI- resolved Creatinine 1.79 on presentation and down to 0.91. Monitor BMP   Obesity (BMI 30-39.9) BMI 38.78 Complicates overall care and prognosis.   Recommend lifestyle modifications including physical activity and diet for weight loss and overall long-term  health.   Anasarca - improved/resolved  Echocardiogram showed normal EF.   Switched Lasix to oral, on 20 mg BID.     Fatigue-  Increased levothyroxine with TSH being elevated above 14 5/14.   Free T4 as normal. recheck TSH outpatient    Schizophrenia  (HCC) Continue home amitriptyline, Invega and venlafaxine   HTN (hypertension) Blood pressure stable.   Continue propranolol and amlodipine     DVT prophylaxis: lovenox  Pertinent IV fluids/nutrition: regular diet  Central lines / invasive devices: none  Code Status: FULL CODE ACP documentation reviewed: 09/20/22 non eon file in [place]   Current Admission Status: inpatient   TOC needs / Dispo plan: SNF rehab Barriers to discharge / significant pending items: awaiting placement d/t financial issues, should be able to discharge 06/01

## 2022-09-20 NOTE — TOC Progression Note (Addendum)
Transition of Care [employer]) - Progression Note    Patient Details  Name: Christopher Mason MRN: [id number] Date of Birth: April 23, 1961  [facility]) CM/SW Contact  Marlowe Sax, RN Phone Number: 09/20/2022, 9:49 AM  Clinical Narrative:   [name] with [employer] is to come in today and was going to check with the \\bank  to ensure that the funds were in place and will assist with making payment to [facility] so that the patient can DC to [facility] verified that the patient can go ahead and DC to [facility] and then Priddy with [employer] can assist with payment there [name]'s number is [phone]        Barriers to Discharge: Continued Medical Work up  Expected Discharge Plan and Services   Discharge Planning Services: CM Consult                     DME Arranged: N/A DME Agency: NA                   Social Determinants of Health (SDOH) Interventions SDOH Screenings   Food Insecurity: No Food Insecurity (08/16/2022)  Housing: Low Risk  (08/16/2022)  Transportation Needs: No Transportation Needs (08/16/2022)  Utilities: Not At Risk (08/16/2022)  Alcohol Screen: Low Risk  (09/06/2021)  Depression (PHQ2-9): Low Risk  (09/06/2021)  Tobacco Use: High Risk (09/07/2022)    Readmission Risk Interventions     No data to display
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
## 2022-09-20 NOTE — Discharge Summary (Signed)
Physician Discharge Summary   Patient: Christopher Mason MRN: [id number]  DOB: 02/28/1961   Admit:     Date of Admission: 08/16/2022 Admitted from: home   Discharge: Date of discharge: 09/20/22 Disposition: Skilled nursing facility Condition at discharge: good  CODE STATUS: FULL CODE     Discharge Physician: Sunnie Nielsen, DO [facility]     PCP: Sallyanne Kuster, NP  Recommendations for Outpatient Follow-up:  Follow up with PCP Sallyanne Kuster, NP in 2-4 weeks Please obtain labs/tests: CBC, BMP, TSH in 2-4 weeks Please follow up on the following pending results: none PCP AND OTHER OUTPATIENT PROVIDERS: SEE BELOW FOR SPECIFIC DISCHARGE INSTRUCTIONS PRINTED FOR PATIENT IN ADDITION TO GENERIC AVS PATIENT INFO     Discharge Instructions     Diet - low sodium heart healthy   Complete by: As directed    Increase activity slowly   Complete by: As directed    No wound care   Complete by: As directed          Discharge Diagnoses: Principal Problem:   Lumbar radiculopathy, acute Active Problems:   Left leg weakness   Acute respiratory failure with hypoxia (HCC)   COPD exacerbation (HCC)   CAP (community acquired pneumonia)   AKI (acute kidney injury) (HCC)   HTN (hypertension)   Hypothyroidism   COPD, group C, by GOLD 2017 classification (HCC)   Hyponatremia   Schizophrenia (HCC)   Chronic pain of left knee   Fatigue   Anasarca   Constipation   Obesity (BMI 30-39.9)   Lumbar radiculopathy   Atelectasis of both lungs   Fall   Pneumonia of left lower lobe due to infectious organism   Iron deficiency anemia       Hospital Course: 62 y.o. male with medical history significant of COPD, schizophrenia, multiple previous spinal surgery, hypothyroidism, bipolar disorder, tardive dyskinesia, who presented to the ED with complaints of a fall. He notes that his left leg weakness and numbness started as left hip pain a few days ago. He has had  multiple falls due to the weakness. He denies any other focal weakness. He endorsed increased cough over the last few days in addition to increased shortness of breath.  Patient had steroid injection on 4/26.   4/24: to ED 4/25: initial neurosurgery consult, planning epidural steroid injection, no surgery  4/26: successful epidural injection on the left L3-L4.  4/30: neurosurgery saw pt today, If pt continues to have substantial weakness that precludes walking, would consider surgical intervention as soon as 2 weeks after his epidural injection  5/8.  Still having a lot of back pain. PT recommending SNF 5/9.  Patient able to come down to 2 L of oxygen.  Still having weakness in his left leg. 5/10.  ABG showing a pO2 of 121. Patient remained in the hospital , weaning of oxygen.  Complaining of ongoing pain. Abdominal distention and constipation improved. 5/14: pulmonology consult for COPD 5/15, underwent microdiscectomy with some clinical improvement. 5/17, needs skilled nursing facility. 5/18-19 - O2 needs rising again, not wheezing 5/20 - IV iron infusion, O2 down 5 >> 3 L/min today 5/21 - started low dose gabapentin for persistent LLE radicular pain, not tolerating OOB activity 5/22-5/28: stable, awaiting rehab.  Consultants:  Neurosurgery Interventional Radiology  Pulmonology   Procedures: 08/18/22 epidural injection on the left L3-L4.  09/06/22 microdiscectomy       ASSESSMENT & PLAN:   Lumbar radiculopathy, acute on chronic  bilateral leg weakness  Patient with disc herniation L3/4.  IR performed steroid injection on 4/26.   Neurosurgery performed microdiscectomy 5/15, drain removed 5/19.  ongoing PT/OT, recommending SNF.  analgesia PRN w/ gabapentin, norco Neurosurgery outpatient f/u OOB as much as possible   COPD-  No baseline O2 use, per patient.  Has been stable around 2-3L  Continue bronchodilator therapy.   Keep on oxygen to keep saturation more than  88%. pulmonology following, appreciate your recs - avoid steroids unless wheezing   Iron deficiency anemia  iron 24, ferritin 18. IV iron infusion 5/20 Repeat IV iron 5/22 Monitor CBC   CAP- stable.  Completed antibiotic and steroid course inpatient.  Seen by pulmonology pre-operatively. Continue nebulizer treatments.   AKI- resolved Creatinine 1.79 on presentation and down to 0.91. Monitor BMP   Obesity (BMI 30-39.9) BMI 38.78 Complicates overall care and prognosis.   Recommend lifestyle modifications including physical activity and diet for weight loss and overall long-term health.   Anasarca - improved/resolved  Echocardiogram showed normal EF.   Switched Lasix to oral, on 20 mg BID.     Fatigue-  Increased levothyroxine with TSH being elevated above 14 5/14.   Free T4 as normal. recheck TSH outpatient    Schizophrenia (HCC) Continue home amitriptyline, Invega and venlafaxine   HTN (hypertension) Blood pressure stable.   Continue propranolol and amlodipine             Discharge Instructions  Allergies as of 09/20/2022       Reactions   Aspirin Nausea And Vomiting, Swelling        Medication List     STOP taking these medications    docusate sodium 100 MG capsule Commonly known as: COLACE   ipratropium-albuterol 0.5-2.5 (3) MG/3ML Soln Commonly known as: DUONEB   lisinopril 40 MG tablet Commonly known as: ZESTRIL       TAKE these medications    albuterol 108 (90 Base) MCG/ACT inhaler Commonly known as: VENTOLIN HFA Inhale 2 puffs into the lungs every 6 (six) hours as needed for wheezing or shortness of breath.   amitriptyline 25 MG tablet Commonly known as: ELAVIL Take 1 tablet (25 mg total) by mouth at bedtime. What changed:  medication strength how much to take   amLODipine 10 MG tablet Commonly known as: NORVASC TAKE 1 TABLET (10 MG TOTAL) BY MOUTH DAILY.   arformoterol 15 MCG/2ML Nebu Commonly known as: BROVANA Take 2  mLs (15 mcg total) by nebulization 2 (two) times daily.   atorvastatin 20 MG tablet Commonly known as: LIPITOR Take 1 tablet (20 mg total) by mouth daily.   bisacodyl 10 MG suppository Commonly known as: DULCOLAX Place 1 suppository (10 mg total) rectally daily as needed for moderate constipation.   budesonide 0.25 MG/2ML nebulizer solution Commonly known as: PULMICORT Take 2 mLs (0.25 mg total) by nebulization 2 (two) times daily.   feeding supplement Liqd Take 237 mLs by mouth 2 (two) times daily between meals.   folic acid 1 MG tablet Commonly known as: FOLVITE TAKE 1 TABLET (1 MG TOTAL) BY MOUTH DAILY.   furosemide 20 MG tablet Commonly known as: LASIX Take 1 tablet (20 mg total) by mouth 2 (two) times daily.   gabapentin 100 MG capsule Commonly known as: NEURONTIN Take 1 capsule (100 mg total) by mouth 3 (three) times daily.   HYDROcodone-acetaminophen 5-325 MG tablet Commonly known as: NORCO/VICODIN Take 1 tablet by mouth every 6 (six) hours as needed for moderate pain or severe pain.  Hinda Glatter Sustenna 234 MG/1.5ML injection Generic drug: paliperidone Inject into the muscle. Every 4 weeks.   levothyroxine 150 MCG tablet Commonly known as: SYNTHROID Take 1 tablet (150 mcg total) by mouth daily before breakfast. Start taking on: Sep 21, 2022 What changed:  medication strength how much to take   meloxicam 15 MG tablet Commonly known as: MOBIC Take 1 tablet (15 mg total) by mouth daily. In am with breakfast   methocarbamol 750 MG tablet Commonly known as: ROBAXIN Take 1 tablet (750 mg total) by mouth every 8 (eight) hours as needed for muscle spasms.   multivitamin with minerals Tabs tablet Take 1 tablet by mouth daily.   nicotine 21 mg/24hr patch Commonly known as: NICODERM CQ - dosed in mg/24 hours PLACE 1 PATCH (21 MG TOTAL) ONTO THE SKIN DAILY.   pantoprazole 40 MG tablet Commonly known as: PROTONIX Take 1 tablet (40 mg total) by mouth daily.    polyethylene glycol 17 g packet Commonly known as: MIRALAX / GLYCOLAX Take 34 g by mouth 2 (two) times daily.   propranolol 10 MG tablet Commonly known as: INDERAL TAKE 1 TABLET (10 MG TOTAL) BY MOUTH 3 (THREE) TIMES DAILY.   revefenacin 175 MCG/3ML nebulizer solution Commonly known as: YUPELRI Take 3 mLs (175 mcg total) by nebulization daily. Start taking on: Sep 21, 2022   solifenacin 10 MG tablet Commonly known as: VESICARE Take 10 mg by mouth daily.   tamsulosin 0.4 MG Caps capsule Commonly known as: FLOMAX TAKE ONE CAPSULE BY MOUTH EVERY DAY   thiamine 100 MG tablet Commonly known as: Vitamin B-1 Take 1 tablet (100 mg total) by mouth daily. Start taking on: Sep 21, 2022   traZODone 100 MG tablet Commonly known as: DESYREL Take 100 mg by mouth at bedtime.   venlafaxine XR 75 MG 24 hr capsule Commonly known as: EFFEXOR-XR Take 1 capsule (75 mg total) by mouth daily with breakfast.   Vitamin D (Ergocalciferol) 1.25 MG (50000 UNIT) Caps capsule Commonly known as: DRISDOL TAKE 1 CAPSULE (50,000 UNITS TOTAL) BY MOUTH ONCE A WEEK.          Contact information for follow-up providers     Drake Leach, PA-C Follow up on 09/21/2022.   Specialty: Neurosurgery Contact information: [address] [place] [address] [phone]              Contact information for after-discharge care     Destination     [facility] Preferred SNF .   Service: Skilled Nursing Contact information: [address] [place] [address] [phone]                     Allergies  Allergen Reactions   Aspirin Nausea And Vomiting and Swelling     Subjective: pt reports no concerns, he is excited to be able to leave the hospital today, denies CP/SOB, toelrating diet, pain controlled    Discharge Exam: BP (!) 137/91   Pulse 83   Temp 98.2 F (36.8 C)   Resp 20   Ht 5\' 7"  (1.702 m)   Wt 105 kg   SpO2 92%    BMI 36.26 kg/m  General: Pt is alert, awake, not in acute distress Cardiovascular: RRR, S1/S2 +, no rubs, no gallops Respiratory: CTA bilaterally, diminished breath sounds but no wheezing, no rhonchi Abdominal: Soft, NT, ND, bowel sounds + Extremities: no edema, no cyanosis     The results of significant diagnostics from  this hospitalization (including imaging, microbiology, ancillary and laboratory) are listed below for reference.     Microbiology: No results found for this or any previous visit (from the past 240 hour(s)).   Labs: BNP (last 3 results) Recent Labs    08/16/22 1132 08/28/22 1406  BNP 28.0 47.3   Basic Metabolic Panel: Recent Labs  Lab 09/14/22 0449 09/18/22 0943  NA 136 134*  K 4.6 4.1  CL 98 98  CO2 30 30  GLUCOSE 116* 99  BUN 24* 25*  CREATININE 0.87 0.77  CALCIUM 9.3 9.1   Liver Function Tests: Recent Labs  Lab 09/18/22 0943  AST 16  ALT 20  ALKPHOS 76  BILITOT 0.3  PROT 7.0  ALBUMIN 3.2*   No results for input(s): "LIPASE", "AMYLASE" in the last 168 hours. No results for input(s): "AMMONIA" in the last 168 hours. CBC: Recent Labs  Lab 09/14/22 0449 09/18/22 0943  WBC 6.7 9.3  HGB 9.8* 10.6*  HCT 32.8* 34.9*  MCV 94.0 92.6  PLT 283 276   Cardiac Enzymes: No results for input(s): "CKTOTAL", "CKMB", "CKMBINDEX", "TROPONINI" in the last 168 hours. BNP: Invalid input(s): "POCBNP" CBG: No results for input(s): "GLUCAP" in the last 168 hours. D-Dimer No results for input(s): "DDIMER" in the last 72 hours. Hgb A1c No results for input(s): "HGBA1C" in the last 72 hours. Lipid Profile No results for input(s): "CHOL", "HDL", "LDLCALC", "TRIG", "CHOLHDL", "LDLDIRECT" in the last 72 hours. Thyroid function studies No results for input(s): "TSH", "T4TOTAL", "T3FREE", "THYROIDAB" in the last 72 hours.  Invalid input(s): "FREET3" Anemia work up No results for input(s): "VITAMINB12", "FOLATE", "FERRITIN", "TIBC", "IRON",  "RETICCTPCT" in the last 72 hours. Urinalysis    Component Value Date/Time   COLORURINE YELLOW (A) 08/16/2022 2015   APPEARANCEUR CLEAR (A) 08/16/2022 2015   APPEARANCEUR Clear 03/02/2020 0916   LABSPEC 1.019 08/16/2022 2015   PHURINE 5.0 08/16/2022 2015   GLUCOSEU NEGATIVE 08/16/2022 2015   HGBUR SMALL (A) 08/16/2022 2015   BILIRUBINUR NEGATIVE 08/16/2022 2015   BILIRUBINUR Negative 03/02/2020 0916   KETONESUR NEGATIVE 08/16/2022 2015   PROTEINUR NEGATIVE 08/16/2022 2015   NITRITE NEGATIVE 08/16/2022 2015   LEUKOCYTESUR NEGATIVE 08/16/2022 2015   Sepsis Labs Recent Labs  Lab 09/14/22 0449 09/18/22 0943  WBC 6.7 9.3   Microbiology No results found for this or any previous visit (from the past 240 hour(s)). Imaging DG Thoracic Spine 2 View  Result Date: 08/30/2022 CLINICAL DATA:  Back pain and recent fall, initial encounter EXAM: THORACIC SPINE 2 VIEWS COMPARISON:  None Available. FINDINGS: No definitive compression deformity is noted. Multilevel osteophytic changes are seen. No pedicle abnormality or paraspinal mass is seen. Visualize ribcage is within normal limits. IMPRESSION: No acute abnormality noted. Electronically Signed   By: Alcide Clever M.D.   On: 08/30/2022 20:05   ECHOCARDIOGRAM COMPLETE  Result Date: 08/30/2022    ECHOCARDIOGRAM REPORT   Patient Name:   VITALIY DINGLE Date of Exam: 08/29/2022 Medical Rec #:  [id number]           Height:       67.0 in Accession #:    [id number]          Weight:       200.0 lb Date of Birth:  02/21/1961            BSA:          2.022 m Patient Age:    57 years  BP:           130/86 mmHg Patient Gender: M                   HR:           50 bpm. Exam Location:  [facility] Procedure: 2D Echo, Cardiac Doppler and Color Doppler Indications:     I50.31 Acute Diastolic CHF  History:         Patient has prior history of Echocardiogram examinations, most                  recent 07/22/2020. COPD; Risk Factors:Hypertension and                   Dyslipidemia.  Sonographer:     Daphine Deutscher RDCS Referring Phys:  [id number] Alphonsus Sias GRIFFITH Diagnosing Phys: Debbe Odea MD IMPRESSIONS  1. Left ventricular ejection fraction, by estimation, is 55 to 60%. The left ventricle has normal function. The left ventricle has no regional wall motion abnormalities. Left ventricular diastolic parameters were normal.  2. Right ventricular systolic function is normal. The right ventricular size is normal.  3. The mitral valve is normal in structure. No evidence of mitral valve regurgitation.  4. The aortic valve was not well visualized. Aortic valve regurgitation is not visualized.  5. Aortic dilatation noted. There is mild dilatation of the aortic root, measuring 39 mm.  6. The inferior vena cava is normal in size with greater than 50% respiratory variability, suggesting right atrial pressure of 3 mmHg. FINDINGS  Left Ventricle: Left ventricular ejection fraction, by estimation, is 55 to 60%. The left ventricle has normal function. The left ventricle has no regional wall motion abnormalities. The left ventricular internal cavity size was normal in size. There is  no left ventricular hypertrophy. Left ventricular diastolic parameters were normal. Right Ventricle: The right ventricular size is normal. No increase in right ventricular wall thickness. Right ventricular systolic function is normal. Left Atrium: Left atrial size was normal in size. Right Atrium: Right atrial size was normal in size. Pericardium: There is no evidence of pericardial effusion. Mitral Valve: The mitral valve is normal in structure. No evidence of mitral valve regurgitation. Tricuspid Valve: The tricuspid valve is not well visualized. Tricuspid valve regurgitation is not demonstrated. Aortic Valve: The aortic valve was not well visualized. Aortic valve regurgitation is not visualized. Pulmonic Valve: The pulmonic valve was normal in structure. Pulmonic valve regurgitation is mild. Aorta:  Aortic dilatation noted. There is mild dilatation of the aortic root, measuring 39 mm. Venous: The inferior vena cava is normal in size with greater than 50% respiratory variability, suggesting right atrial pressure of 3 mmHg. IAS/Shunts: No atrial level shunt detected by color flow Doppler.  LEFT VENTRICLE PLAX 2D LVIDd:         4.70 cm Diastology LVIDs:         3.20 cm LV e' medial:    11.27 cm/s LV PW:         1.00 cm LV E/e' medial:  7.7 LV IVS:        1.00 cm LV e' lateral:   12.50 cm/s                        LV E/e' lateral: 7.0  RIGHT VENTRICLE             IVC RV Basal diam:  3.60 cm     IVC diam: 1.90 cm  RV S prime:     16.43 cm/s TAPSE (M-mode): 2.4 cm LEFT ATRIUM             Index        RIGHT ATRIUM           Index LA diam:        3.80 cm 1.88 cm/m   RA Area:     14.70 cm LA Vol (A2C):   36.8 ml 18.20 ml/m  RA Volume:   39.10 ml  19.33 ml/m LA Vol (A4C):   40.5 ml 20.03 ml/m LA Biplane Vol: 40.1 ml 19.83 ml/m  AORTIC VALVE LVOT Vmax:   143.00 cm/s LVOT Vmean:  96.550 cm/s LVOT VTI:    0.275 m  AORTA Ao Root diam: 3.90 cm MITRAL VALVE MV Area (PHT): 3.59 cm    SHUNTS MV Decel Time: 212 msec    Systemic VTI: 0.28 m MV E velocity: 87.05 cm/s MV A velocity: 99.15 cm/s MV E/A ratio:  0.88 Debbe Odea MD Electronically signed by Debbe Odea MD Signature Date/Time: 08/30/2022/6:15:02 PM    Final       Time coordinating discharge: over 30 minutes  SIGNED:  Sunnie Nielsen DO [facility]

## 2022-09-20 NOTE — TOC Progression Note (Signed)
Transition of Care [facility]) - Progression Note    Patient Details  Name: Lawrnce Heyes MRN: [id number] Date of Birth: 1961-01-08  [facility] of [facility]) CM/SW Contact  Marlowe Sax, RN Phone Number: 09/20/2022, 12:03 PM  Clinical Narrative:   Patient going to room 90A at [facility] today EMS to transport [place] with [facility] Post to make aware      Barriers to Discharge: Continued Medical Work up  Expected Discharge Plan and Services   Discharge Planning Services: CM Consult     Expected Discharge Date: 09/20/22               DME Arranged: N/A DME Agency: NA                   Social Determinants of Health (SDOH) Interventions SDOH Screenings   Food Insecurity: No Food Insecurity (08/16/2022)  Housing: Low Risk  (08/16/2022)  Transportation Needs: No Transportation Needs (08/16/2022)  Utilities: Not At Risk (08/16/2022)  Alcohol Screen: Low Risk  (09/06/2021)  Depression (PHQ2-9): Low Risk  (09/06/2021)  Tobacco Use: High Risk (09/07/2022)    Readmission Risk Interventions     No data to display

## 2022-09-21 ENCOUNTER — Encounter: Payer: Medicaid Other | Admitting: Orthopedic Surgery

## 2022-09-21 ENCOUNTER — Ambulatory Visit: Payer: Medicaid Other | Admitting: Nurse Practitioner

## 2022-09-22 ENCOUNTER — Telehealth: Payer: Self-pay | Admitting: Orthopedic Surgery

## 2022-09-22 NOTE — Telephone Encounter (Signed)
Please fax orders to [facility]. See below.

## 2022-09-22 NOTE — Telephone Encounter (Signed)
-----   Message from Rockey Situ sent at 09/22/2022  8:09 AM EDT ----- [facility] said they can but you need to fax them an order stating remove his stitches and fax it to [facility] [phone] ----- Message ----- From: Drake Leach, PA-C Sent: 09/21/2022   4:52 PM EDT To: [facility]  He had postop visit with me today and did not show up.   He is at a SNF. Room 90A at [facility].   Please call them and have them remove his sutures if possible. If not, he needs to see me next week.   Thanks.

## 2022-09-22 NOTE — Telephone Encounter (Signed)
Order faxed to [facility]. 

## 2022-10-02 ENCOUNTER — Ambulatory Visit: Payer: Medicaid Other | Admitting: Nurse Practitioner

## 2022-10-18 ENCOUNTER — Encounter: Payer: Self-pay | Admitting: Pulmonary Disease

## 2022-10-18 ENCOUNTER — Ambulatory Visit (INDEPENDENT_AMBULATORY_CARE_PROVIDER_SITE_OTHER): Payer: Medicaid Other | Admitting: Pulmonary Disease

## 2022-10-18 VITALS — BP 118/80 | HR 68 | Temp 97.5°F | Ht 67.0 in | Wt 237.0 lb

## 2022-10-18 DIAGNOSIS — G2401 Drug induced subacute dyskinesia: Secondary | ICD-10-CM | POA: Diagnosis not present

## 2022-10-18 DIAGNOSIS — J449 Chronic obstructive pulmonary disease, unspecified: Secondary | ICD-10-CM

## 2022-10-18 DIAGNOSIS — Z87891 Personal history of nicotine dependence: Secondary | ICD-10-CM

## 2022-10-18 DIAGNOSIS — J9611 Chronic respiratory failure with hypoxia: Secondary | ICD-10-CM

## 2022-10-18 NOTE — Patient Instructions (Addendum)
Your lungs sounded really clear today.  Continue oxygen at 2 L/min.  Continue Spiriva for now.  Congratulations on quitting smoking!  We will see you in follow-up in 2 months time, sooner should any new problems arise.
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
## 2022-10-19 ENCOUNTER — Ambulatory Visit (INDEPENDENT_AMBULATORY_CARE_PROVIDER_SITE_OTHER): Payer: Medicaid Other | Admitting: Neurosurgery

## 2022-10-19 ENCOUNTER — Encounter: Payer: Self-pay | Admitting: Neurosurgery

## 2022-10-19 VITALS — BP 130/87 | HR 70 | Temp 98.1°F

## 2022-10-19 DIAGNOSIS — M5416 Radiculopathy, lumbar region: Secondary | ICD-10-CM

## 2022-10-19 DIAGNOSIS — Z09 Encounter for follow-up examination after completed treatment for conditions other than malignant neoplasm: Secondary | ICD-10-CM

## 2022-10-19 NOTE — Progress Notes (Signed)
   DOS: 09/06/2022 (L L3/4 microdiscectomy)  HISTORY OF PRESENT ILLNESS: 10/19/2022 [name] is status post lumbar microdiscectomy.  He is in a facility currently.  He is doing physical therapy.  His strength is improved.  PHYSICAL EXAMINATION:   Vitals:   10/19/22 1335  BP: 130/87  Pulse: 70  Temp: 98.1 F (36.7 C)   General: Patient is well developed, well nourished, calm, collected, and in no apparent distress.  NEUROLOGICAL:  General: In no acute distress.  Awake, alert, oriented to person, place, and time. Pupils equal round and reactive to light.   Strength:  Side Iliopsoas Quads Hamstring PF DF EHL  R 5 5 5 5 5 5   L 4- 4- 4 5 5 5    Incision c/d/i   ROS (Neurologic): Negative except as noted above  IMAGING: No interval imaging to review   ASSESSMENT/PLAN:  Christopher Mason is doing well after lumbar microdiscectomy.  His strength is improved.  I would recommend he continue physical therapy.  Will see him back in 6 weeks.  I spent a total of 10 minutes in this patient's care today. This time was spent reviewing pertinent records including imaging studies, obtaining and confirming history, performing a directed evaluation, formulating and discussing my recommendations, and documenting the visit within the medical record.    Venetia Night MD, [facility] Department of Neurosurgery

## 2022-11-28 ENCOUNTER — Encounter: Payer: Self-pay | Admitting: Neurosurgery

## 2022-11-28 ENCOUNTER — Encounter: Payer: Medicaid Other | Admitting: Orthopedic Surgery

## 2022-11-28 ENCOUNTER — Ambulatory Visit (INDEPENDENT_AMBULATORY_CARE_PROVIDER_SITE_OTHER): Payer: MEDICAID | Admitting: Neurosurgery

## 2022-11-28 VITALS — BP 136/94 | Temp 97.7°F

## 2022-11-28 DIAGNOSIS — M5416 Radiculopathy, lumbar region: Secondary | ICD-10-CM

## 2022-11-28 DIAGNOSIS — Z09 Encounter for follow-up examination after completed treatment for conditions other than malignant neoplasm: Secondary | ICD-10-CM

## 2022-11-28 NOTE — Progress Notes (Signed)
   DOS: 09/06/2022 (L L3/4 microdiscectomy)  HISTORY OF PRESENT ILLNESS: 11/28/22 Christopher Mason is a 62 y.o presenting today for 3 month post-op follow up. Overall he is doing really well post-operatively and reports complete resolution of his preoperative leg pain.  He is working towards transitioning to an independent apartment.  He is very excited about this.  10/19/22 Dr. Myer Haff [name] is status post lumbar microdiscectomy.  He is in a facility currently.  He is doing physical therapy.  His strength is improved.  PHYSICAL EXAMINATION:   There were no vitals filed for this visit.  General: Patient is well developed, well nourished, calm, collected, and in no apparent distress.  NEUROLOGICAL:  General: In no acute distress.  Awake, alert, oriented to person, place, and time. Pupils equal round and reactive to light.   Strength:  Side Iliopsoas Quads Hamstring PF DF EHL  R 5 5 5 5 5 5   L 4 4 4 5 5 5    Incision well-healed  ROS (Neurologic): Negative except as noted above  IMAGING: No interval imaging to review   ASSESSMENT/PLAN:  Christopher Mason is doing well after lumbar microdiscectomy.  He is very pleased with his postoperative recovery thus far.  We will see him back in 6 months or sooner should he have any questions or concerns.  He is released to resume activities as tolerated.  I spent a total of 15 minutes in both face-to-face and non-face-to-face activities for this visit on the date of this encounter.  Manning Charity PA-C Department of Neurosurgery

## 2022-12-13 ENCOUNTER — Telehealth: Payer: Self-pay

## 2022-12-13 NOTE — Telephone Encounter (Signed)
Spoke with charlotte  from [facility] that we nee medical record request she going to email toni

## 2022-12-13 NOTE — Telephone Encounter (Signed)
[email or web address] case manager called [phone] EXT [phone] they need pt updated med list printed and gave alyssa to review

## 2022-12-19 ENCOUNTER — Ambulatory Visit: Payer: Medicaid Other | Admitting: Pulmonary Disease

## 2022-12-21 ENCOUNTER — Ambulatory Visit: Payer: Medicaid Other | Admitting: Pulmonary Disease

## 2022-12-26 NOTE — Telephone Encounter (Signed)
We never received medical request from [facility]

## 2023-01-15 ENCOUNTER — Telehealth: Payer: Self-pay

## 2023-01-15 NOTE — Telephone Encounter (Signed)
Christopher Mason from [place] [phone] ext [phone] that pt going from rehab to facility and he home health with physical therapy advised pt need face to visit he can have mychart video also Christopher Mason said if facility have MD they can do referral and also see pt she will let pt caregiver and pt know

## 2023-01-29 ENCOUNTER — Ambulatory Visit: Payer: MEDICAID

## 2023-02-20 ENCOUNTER — Ambulatory Visit (INDEPENDENT_AMBULATORY_CARE_PROVIDER_SITE_OTHER): Payer: Medicaid Other | Admitting: Nurse Practitioner

## 2023-02-20 ENCOUNTER — Encounter: Payer: Self-pay | Admitting: Nurse Practitioner

## 2023-02-20 VITALS — BP 130/88 | HR 74 | Temp 98.4°F | Resp 16 | Ht 67.0 in | Wt 229.0 lb

## 2023-02-20 DIAGNOSIS — M5416 Radiculopathy, lumbar region: Secondary | ICD-10-CM

## 2023-02-20 DIAGNOSIS — E039 Hypothyroidism, unspecified: Secondary | ICD-10-CM

## 2023-02-20 DIAGNOSIS — R531 Weakness: Secondary | ICD-10-CM

## 2023-02-20 DIAGNOSIS — E538 Deficiency of other specified B group vitamins: Secondary | ICD-10-CM

## 2023-02-20 DIAGNOSIS — Z23 Encounter for immunization: Secondary | ICD-10-CM | POA: Diagnosis not present

## 2023-02-20 DIAGNOSIS — I10 Essential (primary) hypertension: Secondary | ICD-10-CM

## 2023-02-20 DIAGNOSIS — E782 Mixed hyperlipidemia: Secondary | ICD-10-CM

## 2023-02-20 DIAGNOSIS — R5381 Other malaise: Secondary | ICD-10-CM | POA: Diagnosis not present

## 2023-02-20 DIAGNOSIS — Z79899 Other long term (current) drug therapy: Secondary | ICD-10-CM

## 2023-02-20 DIAGNOSIS — E559 Vitamin D deficiency, unspecified: Secondary | ICD-10-CM

## 2023-02-20 MED ORDER — PANTOPRAZOLE SODIUM 40 MG PO TBEC
40.0000 mg | DELAYED_RELEASE_TABLET | Freq: Every day | ORAL | 5 refills | Status: DC
Start: 2023-02-20 — End: 2023-07-17

## 2023-02-20 MED ORDER — GABAPENTIN 100 MG PO CAPS: 100.0000 mg | ORAL_CAPSULE | Freq: Three times a day (TID) | ORAL | 3 refills | Status: DC

## 2023-02-20 MED ORDER — LEVOTHYROXINE SODIUM 150 MCG PO TABS
150.0000 ug | ORAL_TABLET | Freq: Every day | ORAL | 0 refills | Status: DC
Start: 2023-02-20 — End: 2023-03-17

## 2023-02-20 MED ORDER — FUROSEMIDE 20 MG PO TABS
20.0000 mg | ORAL_TABLET | Freq: Two times a day (BID) | ORAL | 0 refills | Status: DC
Start: 2023-02-20 — End: 2023-03-26

## 2023-02-20 MED ORDER — AMLODIPINE BESYLATE 10 MG PO TABS: 10.0000 mg | ORAL_TABLET | Freq: Every day | ORAL | 10 refills | Status: DC

## 2023-02-20 MED ORDER — ATORVASTATIN CALCIUM 20 MG PO TABS: 20.0000 mg | ORAL_TABLET | Freq: Every day | ORAL | 3 refills | Status: DC
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
## 2023-02-22 ENCOUNTER — Other Ambulatory Visit: Payer: Self-pay | Admitting: Nurse Practitioner

## 2023-02-28 ENCOUNTER — Other Ambulatory Visit: Payer: Self-pay | Admitting: Nurse Practitioner

## 2023-03-13 ENCOUNTER — Telehealth: Payer: Self-pay

## 2023-03-14 ENCOUNTER — Telehealth: Payer: Self-pay | Admitting: Neurosurgery

## 2023-03-14 ENCOUNTER — Telehealth: Payer: Self-pay

## 2023-03-14 NOTE — Telephone Encounter (Signed)
He did not have a fusion, so not prior to his appointment. If Dr Myer Haff wants them after evaluating him, he will order them at his appointment.

## 2023-03-14 NOTE — Telephone Encounter (Signed)
Will he need xrays?

## 2023-03-14 NOTE — Telephone Encounter (Signed)
left L3-4 microdiscectomy on 09/06/22  Patient said that he fell about 2 weeks ago while he was walking his dog. His lower back has been hurting since then with increasing pain. He wants to be seen sooner than his next appt 05/31/23. He uses medicaid transportation so he has to give a 3 day notice. Can schedule him with Dr.Yarbrough on 11/26?

## 2023-03-15 ENCOUNTER — Encounter: Payer: Self-pay | Admitting: Nurse Practitioner

## 2023-03-15 ENCOUNTER — Telehealth: Payer: Self-pay

## 2023-03-15 NOTE — Telephone Encounter (Signed)
Faxed  home health aide  to [facility]

## 2023-03-15 NOTE — Telephone Encounter (Signed)
Maggie from [facility] was notified this morning that PT/Ot and home health aide has been ordered.

## 2023-03-15 NOTE — Telephone Encounter (Signed)
Adair Laundry his caregiver confirmed appt

## 2023-03-16 ENCOUNTER — Other Ambulatory Visit: Payer: Self-pay | Admitting: Nurse Practitioner

## 2023-03-16 DIAGNOSIS — Z79899 Other long term (current) drug therapy: Secondary | ICD-10-CM

## 2023-03-16 NOTE — Telephone Encounter (Signed)
Referral have been ordered and Seward Grater has been notified.

## 2023-03-20 ENCOUNTER — Ambulatory Visit: Payer: Self-pay | Admitting: Neurosurgery

## 2023-03-20 LAB — CBC WITH DIFFERENTIAL/PLATELET
Basophils Absolute: 0.1 10*3/uL (ref 0.0–0.2)
Basos: 1 %
EOS (ABSOLUTE): 0.3 10*3/uL (ref 0.0–0.4)
Eos: 3 %
Hematocrit: 42.7 % (ref 37.5–51.0)
Hemoglobin: 13.8 g/dL (ref 13.0–17.7)
Immature Grans (Abs): 0.1 10*3/uL (ref 0.0–0.1)
Immature Granulocytes: 1 %
Lymphocytes Absolute: 1.6 10*3/uL (ref 0.7–3.1)
Lymphs: 16 %
MCH: 31.1 pg (ref 26.6–33.0)
MCHC: 32.3 g/dL (ref 31.5–35.7)
MCV: 96 fL (ref 79–97)
Monocytes Absolute: 1 10*3/uL — ABNORMAL HIGH (ref 0.1–0.9)
Monocytes: 10 %
Neutrophils Absolute: 7.1 10*3/uL — ABNORMAL HIGH (ref 1.4–7.0)
Neutrophils: 69 %
Platelets: 274 10*3/uL (ref 150–450)
RBC: 4.44 x10E6/uL (ref 4.14–5.80)
RDW: 14.3 % (ref 11.6–15.4)
WBC: 10 10*3/uL (ref 3.4–10.8)

## 2023-03-20 LAB — CMP14+EGFR
ALT: 13 [IU]/L (ref 0–44)
AST: 17 [IU]/L (ref 0–40)
Albumin: 4.3 g/dL (ref 3.9–4.9)
Alkaline Phosphatase: 135 [IU]/L — ABNORMAL HIGH (ref 44–121)
BUN/Creatinine Ratio: 12 (ref 10–24)
BUN: 15 mg/dL (ref 8–27)
Bilirubin Total: <0.2 mg/dL (ref 0.0–1.2)
CO2: 23 mmol/L (ref 20–29)
Calcium: 10.5 mg/dL — ABNORMAL HIGH (ref 8.6–10.2)
Chloride: 95 mmol/L — ABNORMAL LOW (ref 96–106)
Creatinine, Ser: 1.24 mg/dL (ref 0.76–1.27)
Globulin, Total: 3.4 g/dL (ref 1.5–4.5)
Glucose: 115 mg/dL — ABNORMAL HIGH (ref 70–99)
Potassium: 4 mmol/L (ref 3.5–5.2)
Sodium: 141 mmol/L (ref 134–144)
Total Protein: 7.7 g/dL (ref 6.0–8.5)
eGFR: 65 mL/min/{1.73_m2} (ref >59)

## 2023-03-20 LAB — TSH+FREE T4
Free T4: 0.41 ng/dL — ABNORMAL LOW (ref 0.82–1.77)
TSH: 176 u[IU]/mL — ABNORMAL HIGH (ref 0.450–4.500)

## 2023-03-20 LAB — LIPID PANEL
Chol/HDL Ratio: 4.3 {ratio} (ref 0.0–5.0)
Cholesterol, Total: 160 mg/dL (ref 100–199)
HDL: 37 mg/dL — ABNORMAL LOW (ref >39)
LDL Chol Calc (NIH): 55 mg/dL (ref 0–99)
Triglycerides: 451 mg/dL — ABNORMAL HIGH (ref 0–149)
VLDL Cholesterol Cal: 68 mg/dL — ABNORMAL HIGH (ref 5–40)

## 2023-03-20 LAB — IRON,TIBC AND FERRITIN PANEL
Ferritin: 43 ng/mL (ref 30–400)
Iron Saturation: 17 % (ref 15–55)
Iron: 55 ug/dL (ref 38–169)
Total Iron Binding Capacity: 327 ug/dL (ref 250–450)
UIBC: 272 ug/dL (ref 111–343)

## 2023-03-20 LAB — VITAMIN D 25 HYDROXY (VIT D DEFICIENCY, FRACTURES): Vit D, 25-Hydroxy: 26.8 ng/mL — ABNORMAL LOW (ref 30.0–100.0)

## 2023-03-20 LAB — B12 AND FOLATE PANEL
Folate: 5.3 ng/mL (ref >3.0)
Vitamin B-12: 558 pg/mL (ref 232–1245)

## 2023-03-21 ENCOUNTER — Other Ambulatory Visit: Payer: Self-pay | Admitting: Nurse Practitioner

## 2023-03-21 DIAGNOSIS — Z79899 Other long term (current) drug therapy: Secondary | ICD-10-CM

## 2023-03-26 ENCOUNTER — Ambulatory Visit (INDEPENDENT_AMBULATORY_CARE_PROVIDER_SITE_OTHER): Payer: Medicaid Other | Admitting: Nurse Practitioner

## 2023-03-26 ENCOUNTER — Telehealth: Payer: Self-pay

## 2023-03-26 ENCOUNTER — Encounter: Payer: Self-pay | Admitting: Nurse Practitioner

## 2023-03-26 VITALS — BP 110/84 | HR 80 | Temp 96.6°F | Resp 16 | Ht 67.0 in | Wt 233.2 lb

## 2023-03-26 DIAGNOSIS — Z87891 Personal history of nicotine dependence: Secondary | ICD-10-CM

## 2023-03-26 DIAGNOSIS — E782 Mixed hyperlipidemia: Secondary | ICD-10-CM | POA: Diagnosis not present

## 2023-03-26 DIAGNOSIS — J9611 Chronic respiratory failure with hypoxia: Secondary | ICD-10-CM | POA: Diagnosis not present

## 2023-03-26 DIAGNOSIS — R296 Repeated falls: Secondary | ICD-10-CM

## 2023-03-26 DIAGNOSIS — Z0001 Encounter for general adult medical examination with abnormal findings: Secondary | ICD-10-CM | POA: Diagnosis not present

## 2023-03-26 DIAGNOSIS — E039 Hypothyroidism, unspecified: Secondary | ICD-10-CM

## 2023-03-26 DIAGNOSIS — I10 Essential (primary) hypertension: Secondary | ICD-10-CM

## 2023-03-26 DIAGNOSIS — K5903 Drug induced constipation: Secondary | ICD-10-CM

## 2023-03-26 DIAGNOSIS — R4189 Other symptoms and signs involving cognitive functions and awareness: Secondary | ICD-10-CM

## 2023-03-26 DIAGNOSIS — E559 Vitamin D deficiency, unspecified: Secondary | ICD-10-CM

## 2023-03-26 DIAGNOSIS — Z79899 Other long term (current) drug therapy: Secondary | ICD-10-CM

## 2023-03-26 MED ORDER — DOCUSATE SODIUM 50 MG PO CAPS: 50.0000 mg | ORAL_CAPSULE | Freq: Every day | ORAL | 5 refills | Status: DC

## 2023-03-26 MED ORDER — FUROSEMIDE 40 MG PO TABS: 40.0000 mg | ORAL_TABLET | Freq: Every day | ORAL | 11 refills | Status: DC

## 2023-03-26 MED ORDER — LEVOTHYROXINE SODIUM 150 MCG PO TABS: 150.0000 ug | ORAL_TABLET | Freq: Every day | ORAL | 3 refills | Status: DC

## 2023-03-26 MED ORDER — VITAMIN D (ERGOCALCIFEROL) 1.25 MG (50000 UNIT) PO CAPS: 50000.0000 [IU] | ORAL_CAPSULE | ORAL | 5 refills | Status: DC

## 2023-03-26 MED ORDER — ATORVASTATIN CALCIUM 40 MG PO TABS: 40.0000 mg | ORAL_TABLET | Freq: Every day | ORAL | 3 refills | Status: DC

## 2023-03-26 NOTE — Telephone Encounter (Signed)
Sent message to [employer] for patient's O2 order by AA.
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
## 2023-03-27 ENCOUNTER — Encounter: Payer: Self-pay | Admitting: Nurse Practitioner

## 2023-03-27 ENCOUNTER — Telehealth: Payer: Self-pay | Admitting: Nurse Practitioner

## 2023-03-27 NOTE — Telephone Encounter (Signed)
Done

## 2023-03-27 NOTE — Telephone Encounter (Signed)
Independent Assessment faxed back to [facility]; [phone]. Scanned-Toni

## 2023-04-03 ENCOUNTER — Ambulatory Visit: Payer: Medicaid Other | Admitting: Neurosurgery

## 2023-04-03 ENCOUNTER — Encounter: Payer: Self-pay | Admitting: Neurosurgery

## 2023-04-03 VITALS — BP 130/82 | Ht 67.0 in | Wt 233.0 lb

## 2023-04-03 DIAGNOSIS — Z09 Encounter for follow-up examination after completed treatment for conditions other than malignant neoplasm: Secondary | ICD-10-CM

## 2023-04-03 DIAGNOSIS — M5416 Radiculopathy, lumbar region: Secondary | ICD-10-CM | POA: Diagnosis not present

## 2023-04-03 NOTE — Progress Notes (Signed)
   DOS: 09/06/2022 (L L3/4 microdiscectomy)  HISTORY OF PRESENT ILLNESS:  04/03/2023 He has been doing reasonably well but has some back pain after some falls.  He is working on getting home health set up.  11/28/22 Christopher Mason is a 62 y.o presenting today for 3 month post-op follow up. Overall he is doing really well post-operatively and reports complete resolution of his preoperative leg pain.  He is working towards transitioning to an independent apartment.  He is very excited about this.  10/19/22 Dr. Myer Haff [name] is status post lumbar microdiscectomy.  He is in a facility currently.  He is doing physical therapy.  His strength is improved.  PHYSICAL EXAMINATION:   Vitals:   04/03/23 1342  BP: 130/82    General: Patient is well developed, well nourished, calm, collected, and in no apparent distress.  NEUROLOGICAL:  General: In no acute distress.  Awake, alert, oriented to person, place, and time. Pupils equal round and reactive to light.   Strength:  Side Iliopsoas Quads Hamstring PF DF EHL  R 5 5 5 5 5 5   L 4+ 4 4 5 5 5    Incision well-healed  ROS (Neurologic): Negative except as noted above  IMAGING: No interval imaging to review   ASSESSMENT/PLAN:  Christopher Mason is doing well after lumbar microdiscectomy.  He has had some falls and some back pain.  He has some residual lumbar radiculopathy.  His breathing status is tenuous.  I think it is reasonable for him to do home health physical therapy.  I will put in a referral for this.  We will see him back in approximately 2 months and discuss consideration of injections at that time if he is not better.     I spent a total of 15 minutes in both face-to-face and non-face-to-face activities for this visit on the date of this encounter.  Venetia Night MD Department of Neurosurgery

## 2023-04-06 ENCOUNTER — Other Ambulatory Visit: Payer: Self-pay

## 2023-04-10 ENCOUNTER — Telehealth: Payer: Self-pay | Admitting: Neurosurgery

## 2023-04-10 NOTE — Telephone Encounter (Signed)
Patient was just seen last week. He is asking for pain medication. The pain is at the bottom of his back across. Exactly where he had surgery before.  His next appt is not until February. Christopher Mason in [place]

## 2023-04-11 ENCOUNTER — Telehealth: Payer: Self-pay | Admitting: Neurosurgery

## 2023-04-11 NOTE — Telephone Encounter (Signed)
Unable to leave message- Voicemail is full

## 2023-04-11 NOTE — Telephone Encounter (Signed)
Duplicate message. 

## 2023-04-11 NOTE — Telephone Encounter (Signed)
Patient is calling to let our office know that he is having a lot of back pain. He is requesting pain medication.   [facility]

## 2023-04-16 NOTE — Telephone Encounter (Signed)
Unable to leave message, Voicemail is full. 2nd attempt.

## 2023-04-23 NOTE — Telephone Encounter (Signed)
Unable to reach patient. Voicemail is full. 3rd attempt.

## 2023-04-26 ENCOUNTER — Other Ambulatory Visit: Payer: Self-pay

## 2023-05-07 ENCOUNTER — Other Ambulatory Visit: Payer: Self-pay

## 2023-05-07 ENCOUNTER — Inpatient Hospital Stay: Admission: RE | Admit: 2023-05-07 | Payer: Medicaid Other | Source: Ambulatory Visit

## 2023-05-08 ENCOUNTER — Ambulatory Visit: Payer: Medicaid Other | Admitting: Pulmonary Disease

## 2023-05-08 ENCOUNTER — Encounter: Payer: Self-pay | Admitting: Pulmonary Disease

## 2023-05-08 VITALS — BP 130/80 | HR 72 | Temp 97.1°F | Ht 67.0 in | Wt 232.0 lb

## 2023-05-08 DIAGNOSIS — J9621 Acute and chronic respiratory failure with hypoxia: Secondary | ICD-10-CM

## 2023-05-08 DIAGNOSIS — J441 Chronic obstructive pulmonary disease with (acute) exacerbation: Secondary | ICD-10-CM | POA: Diagnosis not present

## 2023-05-08 DIAGNOSIS — J449 Chronic obstructive pulmonary disease, unspecified: Secondary | ICD-10-CM

## 2023-05-08 DIAGNOSIS — J9611 Chronic respiratory failure with hypoxia: Secondary | ICD-10-CM

## 2023-05-08 DIAGNOSIS — R0602 Shortness of breath: Secondary | ICD-10-CM

## 2023-05-08 MED ORDER — IPRATROPIUM-ALBUTEROL 0.5-2.5 (3) MG/3ML IN SOLN: 3.0000 mL | Freq: Four times a day (QID) | RESPIRATORY_TRACT | 6 refills | Status: DC

## 2023-05-08 MED ORDER — IPRATROPIUM-ALBUTEROL 0.5-2.5 (3) MG/3ML IN SOLN
3.0000 mL | Freq: Once | RESPIRATORY_TRACT | Status: AC
  Administered 2023-05-08: 3 mL via RESPIRATORY_TRACT
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
## 2023-05-08 NOTE — Patient Instructions (Signed)
 Have sent in prescriptions for your nebulizer medication that will be 4 times a day that is DuoNeb (ipratropium/albuterol ).  We sent in a request to [employer] so that you can get tubing for your nebulizer.  You now require 4 L of oxygen at all times.  I reviewed your chart and he recently had a heart test and at this point will not need fluid pills.  We will see on follow-up in 6 to 8 weeks time call sooner should any problems arise.

## 2023-05-09 ENCOUNTER — Telehealth: Payer: Self-pay

## 2023-05-09 ENCOUNTER — Telehealth: Payer: Self-pay | Admitting: Pulmonary Disease

## 2023-05-09 DIAGNOSIS — J9611 Chronic respiratory failure with hypoxia: Secondary | ICD-10-CM

## 2023-05-09 NOTE — Telephone Encounter (Signed)
Order has been placed. Nothing further needed. 
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
## 2023-05-09 NOTE — Telephone Encounter (Signed)
 Yes by all means please send the DME request for a new nebulizer machine with supplies.

## 2023-05-22 ENCOUNTER — Emergency Department: Payer: Medicaid Other

## 2023-05-22 ENCOUNTER — Inpatient Hospital Stay
Admission: EM | Admit: 2023-05-22 | Discharge: 2023-06-01 | DRG: 190 | Disposition: A | Discharge status: Home / Self Care | Payer: Medicaid Other | Attending: Osteopathic Medicine | Admitting: Osteopathic Medicine

## 2023-05-22 ENCOUNTER — Other Ambulatory Visit: Payer: Self-pay

## 2023-05-22 DIAGNOSIS — F319 Bipolar disorder, unspecified: Secondary | ICD-10-CM | POA: Diagnosis present

## 2023-05-22 DIAGNOSIS — E662 Morbid (severe) obesity with alveolar hypoventilation: Secondary | ICD-10-CM | POA: Diagnosis present

## 2023-05-22 DIAGNOSIS — J9622 Acute and chronic respiratory failure with hypercapnia: Secondary | ICD-10-CM | POA: Diagnosis present

## 2023-05-22 DIAGNOSIS — J810 Acute pulmonary edema: Secondary | ICD-10-CM | POA: Diagnosis present

## 2023-05-22 DIAGNOSIS — K5900 Constipation, unspecified: Secondary | ICD-10-CM | POA: Diagnosis present

## 2023-05-22 DIAGNOSIS — G2401 Drug induced subacute dyskinesia: Secondary | ICD-10-CM | POA: Diagnosis not present

## 2023-05-22 DIAGNOSIS — Z1152 Encounter for screening for COVID-19: Secondary | ICD-10-CM | POA: Diagnosis not present

## 2023-05-22 DIAGNOSIS — I119 Hypertensive heart disease without heart failure: Secondary | ICD-10-CM | POA: Diagnosis present

## 2023-05-22 DIAGNOSIS — I10 Essential (primary) hypertension: Secondary | ICD-10-CM | POA: Diagnosis not present

## 2023-05-22 DIAGNOSIS — E039 Hypothyroidism, unspecified: Secondary | ICD-10-CM

## 2023-05-22 DIAGNOSIS — F209 Schizophrenia, unspecified: Secondary | ICD-10-CM | POA: Diagnosis present

## 2023-05-22 DIAGNOSIS — Z79899 Other long term (current) drug therapy: Secondary | ICD-10-CM | POA: Diagnosis not present

## 2023-05-22 DIAGNOSIS — Z9981 Dependence on supplemental oxygen: Secondary | ICD-10-CM | POA: Diagnosis not present

## 2023-05-22 DIAGNOSIS — Z87891 Personal history of nicotine dependence: Secondary | ICD-10-CM | POA: Diagnosis not present

## 2023-05-22 DIAGNOSIS — J9621 Acute and chronic respiratory failure with hypoxia: Secondary | ICD-10-CM | POA: Diagnosis present

## 2023-05-22 DIAGNOSIS — Z7951 Long term (current) use of inhaled steroids: Secondary | ICD-10-CM | POA: Diagnosis not present

## 2023-05-22 DIAGNOSIS — Z8249 Family history of ischemic heart disease and other diseases of the circulatory system: Secondary | ICD-10-CM

## 2023-05-22 DIAGNOSIS — E876 Hypokalemia: Secondary | ICD-10-CM | POA: Diagnosis present

## 2023-05-22 DIAGNOSIS — F200 Paranoid schizophrenia: Secondary | ICD-10-CM

## 2023-05-22 DIAGNOSIS — J441 Chronic obstructive pulmonary disease with (acute) exacerbation: Principal | ICD-10-CM

## 2023-05-22 DIAGNOSIS — J9611 Chronic respiratory failure with hypoxia: Secondary | ICD-10-CM | POA: Diagnosis present

## 2023-05-22 DIAGNOSIS — E639 Nutritional deficiency, unspecified: Secondary | ICD-10-CM | POA: Diagnosis present

## 2023-05-22 DIAGNOSIS — Z79890 Hormone replacement therapy (postmenopausal): Secondary | ICD-10-CM | POA: Diagnosis not present

## 2023-05-22 DIAGNOSIS — J9601 Acute respiratory failure with hypoxia: Principal | ICD-10-CM

## 2023-05-22 DIAGNOSIS — E66812 Obesity, class 2: Secondary | ICD-10-CM | POA: Diagnosis present

## 2023-05-22 DIAGNOSIS — R600 Localized edema: Secondary | ICD-10-CM | POA: Diagnosis not present

## 2023-05-22 DIAGNOSIS — Z6836 Body mass index (BMI) 36.0-36.9, adult: Secondary | ICD-10-CM

## 2023-05-22 DIAGNOSIS — E785 Hyperlipidemia, unspecified: Secondary | ICD-10-CM | POA: Diagnosis present

## 2023-05-22 DIAGNOSIS — K219 Gastro-esophageal reflux disease without esophagitis: Secondary | ICD-10-CM | POA: Diagnosis present

## 2023-05-22 DIAGNOSIS — E034 Atrophy of thyroid (acquired): Secondary | ICD-10-CM | POA: Diagnosis not present

## 2023-05-22 DIAGNOSIS — Z833 Family history of diabetes mellitus: Secondary | ICD-10-CM | POA: Diagnosis not present

## 2023-05-22 DIAGNOSIS — J9811 Atelectasis: Secondary | ICD-10-CM | POA: Diagnosis present

## 2023-05-22 DIAGNOSIS — Z91148 Patient's other noncompliance with medication regimen for other reason: Secondary | ICD-10-CM

## 2023-05-22 DIAGNOSIS — Z7952 Long term (current) use of systemic steroids: Secondary | ICD-10-CM

## 2023-05-22 DIAGNOSIS — I5021 Acute systolic (congestive) heart failure: Secondary | ICD-10-CM | POA: Diagnosis not present

## 2023-05-22 HISTORY — DX: Personal history of poliomyelitis: Z86.12

## 2023-05-22 LAB — BASIC METABOLIC PANEL
Anion gap: 16 — ABNORMAL HIGH (ref 5–15)
BUN: 11 mg/dL (ref 8–23)
CO2: 32 mmol/L (ref 22–32)
Calcium: 9.3 mg/dL (ref 8.9–10.3)
Chloride: 91 mmol/L — ABNORMAL LOW (ref 98–111)
Creatinine, Ser: 0.83 mg/dL (ref 0.61–1.24)
GFR, Estimated: >60 mL/min (ref >60)
Glucose, Bld: 152 mg/dL — ABNORMAL HIGH (ref 70–99)
Potassium: 2.9 mmol/L — ABNORMAL LOW (ref 3.5–5.1)
Sodium: 139 mmol/L (ref 135–145)

## 2023-05-22 LAB — CBC WITH DIFFERENTIAL/PLATELET
Abs Immature Granulocytes: 0.06 10*3/uL (ref 0.00–0.07)
Basophils Absolute: 0 10*3/uL (ref 0.0–0.1)
Basophils Relative: 1 %
Eosinophils Absolute: 0.3 10*3/uL (ref 0.0–0.5)
Eosinophils Relative: 3 %
HCT: 40.2 % (ref 39.0–52.0)
Hemoglobin: 12.5 g/dL — ABNORMAL LOW (ref 13.0–17.0)
Immature Granulocytes: 1 %
Lymphocytes Relative: 13 %
Lymphs Abs: 1.1 10*3/uL (ref 0.7–4.0)
MCH: 30.9 pg (ref 26.0–34.0)
MCHC: 31.1 g/dL (ref 30.0–36.0)
MCV: 99.5 fL (ref 80.0–100.0)
Monocytes Absolute: 0.8 10*3/uL (ref 0.1–1.0)
Monocytes Relative: 10 %
Neutro Abs: 6.1 10*3/uL (ref 1.7–7.7)
Neutrophils Relative %: 72 %
Platelets: 205 10*3/uL (ref 150–400)
RBC: 4.04 MIL/uL — ABNORMAL LOW (ref 4.22–5.81)
RDW: 14.6 % (ref 11.5–15.5)
WBC: 8.3 10*3/uL (ref 4.0–10.5)
nRBC: 0.2 % (ref 0.0–0.2)

## 2023-05-22 LAB — RESP PANEL BY RT-PCR (RSV, FLU A&B, COVID)  RVPGX2
Influenza A by PCR: NEGATIVE
Influenza B by PCR: NEGATIVE
Resp Syncytial Virus by PCR: NEGATIVE
SARS Coronavirus 2 by RT PCR: NEGATIVE

## 2023-05-22 LAB — RESPIRATORY PANEL BY PCR

## 2023-05-22 LAB — MAGNESIUM: Magnesium: 1.9 mg/dL (ref 1.7–2.4)

## 2023-05-22 LAB — TSH: TSH: 16.009 u[IU]/mL — ABNORMAL HIGH (ref 0.350–4.500)

## 2023-05-22 LAB — BLOOD GAS, VENOUS
Acid-Base Excess: 13.8 mmol/L — ABNORMAL HIGH (ref 0.0–2.0)
Bicarbonate: 40.8 mmol/L — ABNORMAL HIGH (ref 20.0–28.0)
O2 Saturation: 83.2 %
Patient temperature: 37
pCO2, Ven: 60 mmHg (ref 44–60)
pH, Ven: 7.44 — ABNORMAL HIGH (ref 7.25–7.43)
pO2, Ven: 44 mmHg (ref 32–45)

## 2023-05-22 LAB — BRAIN NATRIURETIC PEPTIDE: B Natriuretic Peptide: 90.9 pg/mL (ref 0.0–100.0)

## 2023-05-22 LAB — SARS CORONAVIRUS 2 BY RT PCR: SARS Coronavirus 2 by RT PCR: NEGATIVE

## 2023-05-22 LAB — TROPONIN I (HIGH SENSITIVITY)
Troponin I (High Sensitivity): 18 ng/L — ABNORMAL HIGH (ref <18)
Troponin I (High Sensitivity): 17 ng/L (ref <18)

## 2023-05-22 LAB — PROCALCITONIN: Procalcitonin: <0.1 ng/mL

## 2023-05-22 LAB — T4, FREE: Free T4: 1 ng/dL (ref 0.61–1.12)

## 2023-05-22 MED ORDER — TRAZODONE HCL 100 MG PO TABS
100.0000 mg | ORAL_TABLET | Freq: Every day | ORAL | Status: DC
  Administered 2023-05-23 – 2023-05-31 (×9): 100 mg via ORAL
  Filled 2023-05-22 (×9): qty 1

## 2023-05-22 MED ORDER — SODIUM CHLORIDE 0.9 % IV SOLN: INTRAVENOUS | Status: AC | PRN

## 2023-05-22 MED ORDER — FUROSEMIDE 10 MG/ML IJ SOLN
20.0000 mg | Freq: Once | INTRAMUSCULAR | Status: AC
  Administered 2023-05-23: 20 mg via INTRAVENOUS
  Filled 2023-05-22: qty 4

## 2023-05-22 MED ORDER — POTASSIUM CHLORIDE 10 MEQ/100ML IV SOLN
10.0000 meq | INTRAVENOUS | Status: AC
  Administered 2023-05-22 – 2023-05-23 (×4): 10 meq via INTRAVENOUS
  Filled 2023-05-22: qty 100

## 2023-05-22 MED ORDER — THIAMINE MONONITRATE 100 MG PO TABS
100.0000 mg | ORAL_TABLET | Freq: Every day | ORAL | Status: DC
  Administered 2023-05-23 – 2023-06-01 (×9): 100 mg via ORAL
  Filled 2023-05-22 (×10): qty 1

## 2023-05-22 MED ORDER — METHYLPREDNISOLONE SODIUM SUCC 125 MG IJ SOLR
125.0000 mg | Freq: Once | INTRAMUSCULAR | Status: AC
  Administered 2023-05-22: 125 mg via INTRAVENOUS
  Filled 2023-05-22: qty 2

## 2023-05-22 MED ORDER — SODIUM CHLORIDE 0.9 % IV SOLN: 500.0000 mg | INTRAVENOUS | Status: DC

## 2023-05-22 MED ORDER — AZITHROMYCIN 500 MG IV SOLR
500.0000 mg | Freq: Once | INTRAVENOUS | Status: AC
  Administered 2023-05-22: 500 mg via INTRAVENOUS
  Filled 2023-05-22: qty 5

## 2023-05-22 MED ORDER — SODIUM CHLORIDE 0.9% FLUSH
3.0000 mL | Freq: Two times a day (BID) | INTRAVENOUS | Status: DC
  Administered 2023-05-22 – 2023-05-31 (×18): 3 mL via INTRAVENOUS

## 2023-05-22 MED ORDER — IPRATROPIUM-ALBUTEROL 0.5-2.5 (3) MG/3ML IN SOLN
3.0000 mL | Freq: Four times a day (QID) | RESPIRATORY_TRACT | Status: DC
  Administered 2023-05-22 – 2023-05-25 (×9): 3 mL via RESPIRATORY_TRACT
  Filled 2023-05-22 (×10): qty 3

## 2023-05-22 MED ORDER — PANTOPRAZOLE SODIUM 40 MG PO TBEC
40.0000 mg | DELAYED_RELEASE_TABLET | Freq: Every day | ORAL | Status: DC
  Administered 2023-05-23 – 2023-06-01 (×9): 40 mg via ORAL
  Filled 2023-05-22 (×10): qty 1

## 2023-05-22 MED ORDER — PALIPERIDONE ER 3 MG PO TB24
3.0000 mg | ORAL_TABLET | Freq: Every day | ORAL | Status: DC
  Administered 2023-05-23 – 2023-05-31 (×9): 3 mg via ORAL
  Filled 2023-05-22 (×12): qty 1

## 2023-05-22 MED ORDER — ATORVASTATIN CALCIUM 20 MG PO TABS
40.0000 mg | ORAL_TABLET | Freq: Every day | ORAL | Status: DC
  Administered 2023-05-23 – 2023-06-01 (×9): 40 mg via ORAL
  Filled 2023-05-22 (×10): qty 2

## 2023-05-22 MED ORDER — ENOXAPARIN SODIUM 60 MG/0.6ML IJ SOSY
50.0000 mg | PREFILLED_SYRINGE | INTRAMUSCULAR | Status: DC
Start: 2023-05-22 — End: 2023-06-01
  Administered 2023-05-23 – 2023-05-31 (×10): 50 mg via SUBCUTANEOUS
  Filled 2023-05-22 (×10): qty 0.6

## 2023-05-22 MED ORDER — AMITRIPTYLINE HCL 25 MG PO TABS
25.0000 mg | ORAL_TABLET | Freq: Every day | ORAL | Status: DC
  Administered 2023-05-23 – 2023-05-31 (×9): 25 mg via ORAL
  Filled 2023-05-22 (×10): qty 1

## 2023-05-22 MED ORDER — PREDNISONE 20 MG PO TABS
40.0000 mg | ORAL_TABLET | Freq: Every day | ORAL | Status: DC
  Administered 2023-05-23 – 2023-05-24 (×2): 40 mg via ORAL
  Filled 2023-05-22 (×2): qty 2

## 2023-05-22 MED ORDER — POTASSIUM CHLORIDE CRYS ER 20 MEQ PO TBCR: 20.0000 meq | EXTENDED_RELEASE_TABLET | Freq: Once | ORAL | Status: DC

## 2023-05-22 MED ORDER — IPRATROPIUM-ALBUTEROL 0.5-2.5 (3) MG/3ML IN SOLN
3.0000 mL | Freq: Once | RESPIRATORY_TRACT | Status: AC
  Administered 2023-05-22: 3 mL via RESPIRATORY_TRACT
  Filled 2023-05-22: qty 3

## 2023-05-22 MED ORDER — POTASSIUM CHLORIDE 10 MEQ/100ML IV SOLN
10.0000 meq | Freq: Once | INTRAVENOUS | Status: AC
Start: 2023-05-22 — End: 2023-05-22
  Administered 2023-05-22: 10 meq via INTRAVENOUS
  Filled 2023-05-22: qty 100

## 2023-05-22 MED ORDER — SODIUM CHLORIDE 0.9 % IV SOLN
2.0000 g | Freq: Once | INTRAVENOUS | Status: AC
  Administered 2023-05-22: 2 g via INTRAVENOUS
  Filled 2023-05-22: qty 20

## 2023-05-22 MED ORDER — VENLAFAXINE HCL 37.5 MG PO TABS
100.0000 mg | ORAL_TABLET | Freq: Two times a day (BID) | ORAL | Status: DC
  Administered 2023-05-23 – 2023-06-01 (×17): 100 mg via ORAL
  Filled 2023-05-22 (×21): qty 1

## 2023-05-22 MED ORDER — LEVOTHYROXINE SODIUM 50 MCG PO TABS
150.0000 ug | ORAL_TABLET | Freq: Every day | ORAL | Status: DC
  Administered 2023-05-23 – 2023-06-01 (×10): 150 ug via ORAL
  Filled 2023-05-22 (×3): qty 1
  Filled 2023-05-22: qty 3
  Filled 2023-05-22 (×4): qty 1
  Filled 2023-05-22: qty 3
  Filled 2023-05-22: qty 1

## 2023-05-22 NOTE — Assessment & Plan Note (Signed)
Last TSH and free T4 in November 2024 demonstrating uncontrolled hypothyroidism.  - Resume home Synthroid - TSH and free T4 pending

## 2023-05-22 NOTE — Assessment & Plan Note (Addendum)
+  2 pitting edema bilaterally, in the absence of a diagnosis of heart failure.  May be due to uncontrolled hypothyroidism.  - Trial of Lasix as noted above - TSH and free T4 pending

## 2023-05-22 NOTE — ED Triage Notes (Signed)
See first nurse note. SOB since yesterday, hx COPD and wears 4L chronic. Received 4 duonebs PTA. Was 85% on EMS arrival to home but went to 95% after more nebs. PA examining pt in triage. Speech is hard to understand. Denies hx CHF. Mucous membranes are dry.

## 2023-05-22 NOTE — Assessment & Plan Note (Signed)
Severe COPD exacerbation, potentially due to viral infection.  - S/p Solu-Medrol 125 mg once - Start prednisone 40 mg tomorrow to complete a 5-day course - RVP pending - DuoNebs every 6 hours - Pulmicort twice daily

## 2023-05-22 NOTE — Assessment & Plan Note (Signed)
-   Hold home antihypertensives given blood pressures on the lower end of normal

## 2023-05-22 NOTE — ED Provider Triage Note (Signed)
Emergency Medicine Provider Triage Evaluation Note  Christopher Mason , a 63 y.o. male  was evaluated in triage.  Pt complains of SOB at rest x 1 day. Hx of COPD on continuous O2 4L. No chest pain.   Review of Systems  Positive:  Negative:   Physical Exam  BP (!) 136/118   Pulse 79   Temp 97.9 F (36.6 C) (Oral)   Resp (!) 22   Ht 5\' 7"  (1.702 m)   Wt 105 kg   SpO2 92%   BMI 36.26 kg/m  Gen:   Awake, no distress   Resp:  increased effort; expiratory wheezing bilateral MSK:   Moves extremities without difficulty  Other:    Medical Decision Making  Medically screening exam initiated at 12:46 PM.  Appropriate orders placed.  Christopher Mason was informed that the remainder of the evaluation will be completed by another provider, this initial triage assessment does not replace that evaluation, and the importance of remaining in the ED until their evaluation is complete.     Romeo Apple, Koven Belinsky A, PA-C 05/22/23 1249

## 2023-05-22 NOTE — Assessment & Plan Note (Signed)
-   Resume home regimen

## 2023-05-22 NOTE — ED Provider Notes (Signed)
[facility] Provider Note    Event Date/Time   First MD Initiated Contact with Patient 05/22/23 1535     (approximate)   History   Shortness of Breath   HPI Christopher Mason is a 63 y.o. male with history of COPD chronically on 2 L, HTN, GERD, hypothyroidism presenting today for shortness of breath.  Patient states for the past several days he has had worsening fatigue and shortness of breath.  He does have productive cough with clear sputum.  Intermittent congestion present.  Having worsening oxygen requiring to increase his baseline O2 to 4 L.  Otherwise denies chest pain, fever, chills, nausea, vomiting, abdominal pain.  Has also noted some new swelling in his legs and denies any history of heart failure.  Reviewed recent pulmonology notes back in December 2024.     Physical Exam   Triage Vital Signs: ED Triage Vitals  Encounter Vitals Group     BP 05/22/23 1244 (!) 136/118     Systolic BP Percentile --      Diastolic BP Percentile --      Pulse Rate 05/22/23 1244 79     Resp 05/22/23 1244 (!) 22     Temp 05/22/23 1244 97.9 F (36.6 C)     Temp Source 05/22/23 1244 Oral     SpO2 05/22/23 1241 92 %     Weight 05/22/23 1241 231 lb 7.7 oz (105 kg)     Height 05/22/23 1241 5\' 7"  (1.702 m)     Head Circumference --      Peak Flow --      Pain Score 05/22/23 1241 0     Pain Loc --      Pain Education --      Exclude from Growth Chart --     Most recent vital signs: Vitals:   05/22/23 1518 05/22/23 1703  BP:  101/60  Pulse:  67  Resp:  (!) 21  Temp:  98 F (36.7 C)  SpO2: 93% 90%   Physical Exam: I have reviewed the vital signs and nursing notes. General: Awake, alert, fatigued with tachypnea Head:  Atraumatic, normocephalic.   ENT:  EOM intact, PERRL. Oral mucosa is pink and moist with no lesions. Neck: Neck is supple with full range of motion, No meningeal signs. Cardiovascular:  RRR, No murmurs. Peripheral pulses palpable and  equal bilaterally. Respiratory: Diminished air movement throughout with inspiratory and expiratory wheezing.  Crackles and diminished breath sounds present in the bases.  Tachypnea. Musculoskeletal:  No cyanosis.  1+ pitting edema to bilateral lower extremities.  Moving extremities with full ROM Abdomen:  Soft, nontender, distended.. Neuro:  GCS 15, moving all four extremities, interacting appropriately. Speech clear. Psych:  Calm, appropriate.   Skin:  Warm, dry, no rash.    ED Results / Procedures / Treatments   Labs (all labs ordered are listed, but only abnormal results are displayed) Labs Reviewed  CBC WITH DIFFERENTIAL/PLATELET - Abnormal; Notable for the following components:      Result Value   RBC 4.04 (*)    Hemoglobin 12.5 (*)    All other components within normal limits  BASIC METABOLIC PANEL - Abnormal; Notable for the following components:   Potassium 2.9 (*)    Chloride 91 (*)    Glucose, Bld 152 (*)    Anion gap 16 (*)    All other components within normal limits  BLOOD GAS, VENOUS - Abnormal; Notable for the following components:  pH, Ven 7.44 (*)    Bicarbonate 40.8 (*)    Acid-Base Excess 13.8 (*)    All other components within normal limits  TROPONIN I (HIGH SENSITIVITY) - Abnormal; Notable for the following components:   Troponin I (High Sensitivity) 18 (*)    All other components within normal limits  SARS CORONAVIRUS 2 BY RT PCR  RESP PANEL BY RT-PCR (RSV, FLU A&B, COVID)  [id number]  BRAIN NATRIURETIC PEPTIDE  PROCALCITONIN  TROPONIN I (HIGH SENSITIVITY)     EKG    RADIOLOGY My chest x-ray interpretation.  Low lung volumes.  Bilateral opacities concerning for pulmonary edema.  Trace bilateral pleural effusions present.   PROCEDURES:  Critical Care performed: Yes, see critical care procedure note(s)  .Critical Care  Performed by: Janith Lima, MD Authorized by: Janith Lima, MD   Critical care provider statement:    Critical care time  (minutes):  30   Critical care was necessary to treat or prevent imminent or life-threatening deterioration of the following conditions:  Respiratory failure   Critical care was time spent personally by me on the following activities:  Development of treatment plan with patient or surrogate, discussions with consultants, evaluation of patient's response to treatment, examination of patient, ordering and review of laboratory studies, ordering and review of radiographic studies, ordering and performing treatments and interventions, pulse oximetry, re-evaluation of patient's condition and review of old charts   I assumed direction of critical care for this patient from another provider in my specialty: no     Care discussed with: admitting provider      MEDICATIONS ORDERED IN ED: Medications  cefTRIAXone (ROCEPHIN) 2 g in sodium chloride 0.9 % 100 mL IVPB (has no administration in time range)  azithromycin (ZITHROMAX) 500 mg in sodium chloride 0.9 % 250 mL IVPB (has no administration in time range)  ipratropium-albuterol (DUONEB) 0.5-2.5 (3) MG/3ML nebulizer solution 3 mL (3 mLs Nebulization Given 05/22/23 1608)  methylPREDNISolone sodium succinate (SOLU-MEDROL) 125 mg/2 mL injection 125 mg (125 mg Intravenous Given 05/22/23 1702)  potassium chloride 10 mEq in 100 mL IVPB (10 mEq Intravenous New Bag/Given 05/22/23 1703)     IMPRESSION / MDM / ASSESSMENT AND PLAN / ED COURSE  I reviewed the triage vital signs and the nursing notes.                              Differential diagnosis includes, but is not limited to, COPD exacerbation, new onset CHF, pneumonia, low suspicion ACS  Patient's presentation is most consistent with acute presentation with potential threat to life or bodily function.  Patient is a 63 year old male with history of COPD baseline on 2 L presenting today for worsening shortness of breath.  Found to be hypoxic on his baseline and increased to 4 L after receiving multiple  DuoNeb treatments.  While in the waiting room had to be increased to 6 L and then with new hypoxia increased to a nonrebreather.  On my exam he is tachypneic with diminished air movement throughout and inspiratory/expiratory wheeze present.  Will transition over to BiPAP to help with work of breathing and open lungs.  Given additional DuoNeb as well as Solu-Medrol to treat potential COPD exacerbation.  He does have pitting edema in his lower extremities as well as concern for volume overload on chest x-ray so it is difficult to differentiate between COPD exacerbation versus new onset CHF with recent echo showing no  systolic or diastolic heart failure.  Patient with improvement in breathing while on BiPAP and diminished wheezing.  Strongly suspect COPD exacerbation as a source of his symptoms today.  Will admit to hospitalist for further care.  Separately he had some slurred speech although this appears chronic per patient but is unsure when it started.  CT head shows no acute pathology.  The patient is on the cardiac monitor to evaluate for evidence of arrhythmia and/or significant heart rate changes.     FINAL CLINICAL IMPRESSION(S) / ED DIAGNOSES   Final diagnoses:  Acute hypoxic respiratory failure (HCC)  Hypokalemia  COPD exacerbation (HCC)     Rx / DC Orders   ED Discharge Orders     None        Note:  This document was prepared using Dragon voice recognition software and may include unintentional dictation errors.   Janith Lima, MD 05/22/23 [phone]

## 2023-05-22 NOTE — ED Notes (Signed)
Called lab to add on troponin

## 2023-05-22 NOTE — ED Triage Notes (Signed)
First nurse note: Arrived by [facility] from home for sob that started yesterday. Took two duonebs at home with no relief.  Wears 4L O2 continuously.    EMS admnistered 2 duonebs  85% 4L - 96% 4L after duonebs 127/73 b/p 76HR [id number] 98.6oral

## 2023-05-22 NOTE — Assessment & Plan Note (Addendum)
Patient is presenting with shortness of breath, cough and congestion, predominantly suspected to be acute COPD exacerbation potentially in the setting of a viral infection.  Chest x-ray has low lung volumes, but is otherwise concerning for possible vascular congestion.  Patient has history of a normal echocardiogram in May 2024 and normal BNP though.  Normal BNP may be due to morbid obesity though.  - Continue BiPAP due to increased work of breathing - Wean as tolerated to home 4 L of supplemental oxygen - Management of COPD exacerbation as noted below - S/p one-time dose of azithromycin and ceftriaxone.  Will discontinue at this time pending negative procalcitonin and high suspicion for viral etiology, if infectious - Respiratory viral panel pending - Trial of low-dose IV Lasix

## 2023-05-22 NOTE — ED Notes (Signed)
Oxygen tank replaced
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
## 2023-05-22 NOTE — Progress Notes (Addendum)
PHARMACY CONSULT NOTE - ELECTROLYTES  Pharmacy Consult for Electrolyte Monitoring and Replacement   Recent Labs: Height: 5\' 7"  (170.2 cm) Weight: 105 kg (231 lb 7.7 oz) IBW/kg (Calculated) : 66.1 Estimated Creatinine Clearance: 105.3 mL/min (by C-G formula based on SCr of 0.83 mg/dL). Potassium (mmol/L)  Date Value  05/22/2023 2.9 (L)   Magnesium (mg/dL)  Date Value  09/81/1914 2.2   Calcium (mg/dL)  Date Value  78/29/5621 9.3   Albumin (g/dL)  Date Value  30/86/5784 4.3   Phosphorus (mg/dL)  Date Value  69/62/9528 3.4   Sodium (mmol/L)  Date Value  05/22/2023 139  03/19/2023 141    Assessment  Christopher Mason is a 63 y.o. male presenting with SOB. PMH significant for COPD chronically on 2 L, HTN, GERD, and hypothyroidism. Pharmacy has been consulted to monitor and replace electrolytes.  Diet: No diet ordered yet MIVF: N/A Pertinent medications: N/A  Goal of Therapy: Electrolytes WNL  Plan:  MD ordered KCl 10 mEq IV x 5 Give an additional KCl 20 mEq PO x 1 Check BMP, Mg, Phos with AM labs  Thank you for allowing pharmacy to be a part of this patient's care.  Merryl Hacker, PharmD Clinical Pharmacist 05/22/2023 7:16 PM

## 2023-05-22 NOTE — ED Notes (Signed)
Repeated VS on pt. Pt 84% on 6L. Placed on NRB at 15L. Called lab for second troponin and put in triage middle.

## 2023-05-23 ENCOUNTER — Inpatient Hospital Stay: Payer: Medicaid Other

## 2023-05-23 DIAGNOSIS — J9621 Acute and chronic respiratory failure with hypoxia: Secondary | ICD-10-CM | POA: Diagnosis not present

## 2023-05-23 LAB — CBC WITH DIFFERENTIAL/PLATELET
Abs Immature Granulocytes: 0.04 10*3/uL (ref 0.00–0.07)
Basophils Absolute: 0 10*3/uL (ref 0.0–0.1)
Basophils Relative: 0 %
Eosinophils Absolute: 0 10*3/uL (ref 0.0–0.5)
Eosinophils Relative: 0 %
HCT: 37.7 % — ABNORMAL LOW (ref 39.0–52.0)
Hemoglobin: 11.5 g/dL — ABNORMAL LOW (ref 13.0–17.0)
Immature Granulocytes: 1 %
Lymphocytes Relative: 7 %
Lymphs Abs: 0.5 10*3/uL — ABNORMAL LOW (ref 0.7–4.0)
MCH: 30.7 pg (ref 26.0–34.0)
MCHC: 30.5 g/dL (ref 30.0–36.0)
MCV: 100.8 fL — ABNORMAL HIGH (ref 80.0–100.0)
Monocytes Absolute: 0.3 10*3/uL (ref 0.1–1.0)
Monocytes Relative: 3 %
Neutro Abs: 7.4 10*3/uL (ref 1.7–7.7)
Neutrophils Relative %: 89 %
Platelets: 230 10*3/uL (ref 150–400)
RBC: 3.74 MIL/uL — ABNORMAL LOW (ref 4.22–5.81)
RDW: 14.6 % (ref 11.5–15.5)
WBC: 8.2 10*3/uL (ref 4.0–10.5)
nRBC: 0 % (ref 0.0–0.2)

## 2023-05-23 LAB — CBG MONITORING, ED
Glucose-Capillary: 156 mg/dL — ABNORMAL HIGH (ref 70–99)
Glucose-Capillary: 186 mg/dL — ABNORMAL HIGH (ref 70–99)

## 2023-05-23 LAB — HEMOGLOBIN A1C
Hgb A1c MFr Bld: 5.5 % (ref 4.8–5.6)
Mean Plasma Glucose: 111.15 mg/dL

## 2023-05-23 LAB — BASIC METABOLIC PANEL
Anion gap: 11 (ref 5–15)
BUN: 13 mg/dL (ref 8–23)
CO2: 34 mmol/L — ABNORMAL HIGH (ref 22–32)
Calcium: 9 mg/dL (ref 8.9–10.3)
Chloride: 95 mmol/L — ABNORMAL LOW (ref 98–111)
Creatinine, Ser: 0.87 mg/dL (ref 0.61–1.24)
GFR, Estimated: >60 mL/min (ref >60)
Glucose, Bld: 196 mg/dL — ABNORMAL HIGH (ref 70–99)
Potassium: 3.5 mmol/L (ref 3.5–5.1)
Sodium: 140 mmol/L (ref 135–145)

## 2023-05-23 LAB — HIV ANTIBODY (ROUTINE TESTING W REFLEX): HIV Screen 4th Generation wRfx: NONREACTIVE

## 2023-05-23 LAB — D-DIMER, QUANTITATIVE: D-Dimer, Quant: 0.71 ug{FEU}/mL — ABNORMAL HIGH (ref 0.00–0.50)

## 2023-05-23 LAB — MAGNESIUM: Magnesium: 1.9 mg/dL (ref 1.7–2.4)

## 2023-05-23 LAB — PHOSPHORUS: Phosphorus: 1.7 mg/dL — ABNORMAL LOW (ref 2.5–4.6)

## 2023-05-23 MED ORDER — FLUTICASONE FUROATE-VILANTEROL 200-25 MCG/ACT IN AEPB
1.0000 | INHALATION_SPRAY | Freq: Every day | RESPIRATORY_TRACT | Status: DC
  Administered 2023-05-23 – 2023-05-24 (×2): 1 via RESPIRATORY_TRACT
  Filled 2023-05-23: qty 28

## 2023-05-23 MED ORDER — VITAMIN D (ERGOCALCIFEROL) 1.25 MG (50000 UNIT) PO CAPS
50000.0000 [IU] | ORAL_CAPSULE | ORAL | Status: DC
  Administered 2023-05-23 – 2023-05-30 (×2): 50000 [IU] via ORAL
  Filled 2023-05-23 (×2): qty 1

## 2023-05-23 MED ORDER — INSULIN ASPART 100 UNIT/ML IJ SOLN
0.0000 [IU] | Freq: Three times a day (TID) | INTRAMUSCULAR | Status: DC
  Administered 2023-05-23 – 2023-05-27 (×5): 3 [IU] via SUBCUTANEOUS
  Administered 2023-05-27 – 2023-05-28 (×3): 2 [IU] via SUBCUTANEOUS
  Administered 2023-05-29 – 2023-05-31 (×4): 3 [IU] via SUBCUTANEOUS
  Filled 2023-05-23 (×12): qty 1

## 2023-05-23 MED ORDER — HYDROCOD POLI-CHLORPHE POLI ER 10-8 MG/5ML PO SUER: 5.0000 mL | Freq: Two times a day (BID) | ORAL | Status: DC | PRN

## 2023-05-23 MED ORDER — ACETAMINOPHEN 325 MG PO TABS
650.0000 mg | ORAL_TABLET | Freq: Four times a day (QID) | ORAL | Status: DC | PRN
  Administered 2023-05-23: 650 mg via ORAL
  Filled 2023-05-23: qty 2

## 2023-05-23 MED ORDER — CYANOCOBALAMIN 1000 MCG/ML IJ SOLN
1000.0000 ug | Freq: Every day | INTRAMUSCULAR | Status: AC
  Administered 2023-05-23 – 2023-05-29 (×7): 1000 ug via INTRAMUSCULAR
  Filled 2023-05-23 (×7): qty 1

## 2023-05-23 MED ORDER — DEXTROSE 5 % IV SOLN
30.0000 mmol | Freq: Once | INTRAVENOUS | Status: AC
  Administered 2023-05-23: 30 mmol via INTRAVENOUS
  Filled 2023-05-23: qty 10

## 2023-05-23 MED ORDER — VITAMIN B-12 1000 MCG PO TABS
1000.0000 ug | ORAL_TABLET | Freq: Every day | ORAL | Status: DC
  Administered 2023-05-30 – 2023-06-01 (×3): 1000 ug via ORAL
  Filled 2023-05-23 (×3): qty 1

## 2023-05-23 MED ORDER — GUAIFENESIN ER 600 MG PO TB12
600.0000 mg | ORAL_TABLET | Freq: Two times a day (BID) | ORAL | Status: DC
  Administered 2023-05-23 – 2023-06-01 (×17): 600 mg via ORAL
  Filled 2023-05-23 (×18): qty 1

## 2023-05-23 NOTE — ED Notes (Signed)
Per Geradine Girt give IV lasix.

## 2023-05-23 NOTE — ED Notes (Addendum)
Pt cleansed of incontinence. Purewick removed and condom catheter with standard drainage bag placed on pt. Pt given [place] sandwich tray with water per request. Eating at this time.

## 2023-05-23 NOTE — Progress Notes (Signed)
PHARMACY CONSULT NOTE - ELECTROLYTES  Pharmacy Consult for Electrolyte Monitoring and Replacement   Recent Labs: Height: 5\' 7"  (170.2 cm) Weight: 105 kg (231 lb 7.7 oz) IBW/kg (Calculated) : 66.1 Estimated Creatinine Clearance: 100.4 mL/min (by C-G formula based on SCr of 0.87 mg/dL). Potassium (mmol/L)  Date Value  05/23/2023 3.5   Magnesium (mg/dL)  Date Value  09/81/1914 1.9   Calcium (mg/dL)  Date Value  78/29/5621 9.0   Albumin (g/dL)  Date Value  30/86/5784 4.3   Phosphorus (mg/dL)  Date Value  69/62/9528 1.7 (L)   Sodium (mmol/L)  Date Value  05/23/2023 140  03/19/2023 141    Assessment  Christopher Mason is a 63 y.o. male presenting with SOB. PMH significant for COPD chronically on 2 L, HTN, GERD, and hypothyroidism. Pharmacy has been consulted to monitor and replace electrolytes.  Diet: No diet ordered yet MIVF: N/A Pertinent medications: N/A  Goal of Therapy: Electrolytes WNL  Plan:  Kphos IV x 1 Check BMP, Mg, Phos with AM labs  Thank you for allowing pharmacy to be a part of this patient's care.  Bettey Costa, PharmD Clinical Pharmacist 05/23/2023 7:20 AM

## 2023-05-23 NOTE — ED Notes (Signed)
Pt yelling out at this time for breif change. Pt noted to have a BM. Brief, Linens, and chux changed. Peri-care preformed. Pt given 2 warm blankets.

## 2023-05-23 NOTE — Progress Notes (Signed)
[facility] Progress Note  Patient: Christopher Mason    LKG:[id number]  DOA: 05/22/2023     Date of Service: the patient was seen and examined on 05/23/2023  Chief Complaint  Patient presents with   Shortness of Breath   Brief hospital course: Christopher Mason is a 63 y.o. male with medical history significant of COPD, chronic hypoxic respiratory failure on 4L, schizophrenia, previous spinal surgery, hypothyroidism, hypothyroidism, bipolar disorder, tardive dyskinesia who presents to the ED with c/o SOB.    Christopher Mason states that he has been experiencing worsening shortness of breath, productive cough and congestion for the last several days.  He denies any fever, chills, nausea, vomiting, abdominal pain.  He endorses lower extremity swelling and orthopnea, but states both are chronic.  He denies any chest pain, palpitations.   ED Course:  On arrival to the ED, patient was hypertensive at 136/118 with heart rate of 79.  He was saturating at 92% on 6 L, but was subsequently placed on BiPAP due to increased work of breathing.  He was afebrile at 97.9.  Initial workup notable for hemoglobin of 12.5, potassium 2.9, glucose 152, creatinine 0.83 with GFR above 60.  BNP within normal limits at 90.9.  Troponin negative x 2.  Procalcitonin negative.  COVID-19, influenza and RSV PCR negative.  Chest x-ray with low lung volumes, however bilateral interstitial opacities concerning for edema versus atelectasis.  CT head was obtained with no acute cranial abnormality.  Patient started on azithromycin, ceftriaxone, DuoNebs, and Solu-Medrol.  [facility] contacted for admission.   Assessment and Plan:  # Acute on chronic hypoxic respiratory failure  Patient is on 2 to 3 L oxygen at home COPD 0 patient could be due to viral infection Chest x-ray has low lung volumes, but is otherwise concerning for possible vascular congestion.   TTE normal in May 2024 and normal BNP, which may be due to morbid obesity. S/p  BiPAP due to work of breathing, continue as needed Continue supplemental O2 admission and gradually wean down to baseline 2 to 3 L Currently patient is on faltered oxygen S/p one-time dose of azithromycin and ceftriaxone.  WBC count within normal range, procalcitonin negative, less likely infection, hold off antibiotics. RVP panel negative, negative COVID flu and RSV S/p Trial of low-dose IV Lasix   # COPD exacerbation (HCC) Severe COPD exacerbation, potentially due to viral infection. - S/p Solu-Medrol 125 mg once - Start prednisone 40 mg tomorrow to complete a 5-day course - RVP negative - DuoNebs every 6 hours - Started Breo Ellipta inhaler Mucinex 600 mg p.o. twice daily, Tussionex as needed D-dimer 0.71, slightly elevated, less likely PE, venous duplex negative for DVT   # Hypophosphatemia, nutritional deficiency, Phos repleted. Monitor electrolytes and replete as needed.  # HTN (hypertension) - Hold home antihypertensives given blood pressures on the lower end of normal   # Lower extremity edema +2 pitting edema bilaterally, in the absence of a diagnosis of heart failure.  May be due to uncontrolled hypothyroidism. Venous duplex negative for DVT  - Trial of Lasix as noted above - TSH 16 elevated and free T4 level 1.0 wnl    # Hypothyroidism Last TSH and free T4 in November 2024 demonstrating uncontrolled hypothyroidism. - Resume home Synthroid 150 mcg pod - TSH 16 elevated and free T4 level 1.0 wnl Recommend to follow-up with PCP and endocrinologist as an outpatient to titrate the dose of Synthroid accordingly.  # Schizophrenia (HCC) - Resume home regimen   #  Tardive dyskinesia - Resume home regimen   Body mass index is 36.26 kg/m.  Interventions:  Diet: Heart healthy/carb modified diet DVT Prophylaxis: Subcutaneous Lovenox   Advance goals of care discussion: Full code  Family Communication: family was not present at bedside, at the time of interview.  The  pt provided permission to discuss medical plan with the family. Opportunity was given to ask question and all questions were answered satisfactorily.   Disposition:  Pt is from Home, admitted with SOB, still has Resp failure, which precludes a safe discharge. Discharge to Home, when stable.  Subjective: No significant events overnight, patient still has shortness of breath, feels little bit improvement, no any other active issues. Denies any chest pain or palpitations  Physical Exam: General: NAD, lying comfortably Appear in no distress, affect appropriate Eyes: PERRLA ENT: Oral Mucosa Clear, moist  Neck: no JVD,  Cardiovascular: S1 and S2 Present, no Murmur,  Respiratory: Equal air entry bilaterally, mild crackles and significant wheezes Abdomen: Bowel Sound present, Soft and no tenderness,  Skin: no rashes Extremities: no Pedal edema, no calf tenderness Neurologic: without any new focal findings Gait not checked due to patient safety concerns  Vitals:   05/23/23 0740 05/23/23 1000 05/23/23 1302 05/23/23 1448  BP: (!) 144/93 (!) 172/94  (!) 158/98  Pulse: 84 89  89  Resp: 16 (!) 27  (!) 22  Temp:   98.4 F (36.9 C)   TempSrc:   Oral   SpO2: 92% (!) 89%  97%  Weight:      Height:        Intake/Output Summary (Last 24 hours) at 05/23/2023 1633 Last data filed at 05/23/2023 4098 Gross per 24 hour  Intake 631.03 ml  Output 500 ml  Net 131.03 ml   Filed Weights   05/22/23 1241  Weight: 105 kg    Data Reviewed: I have personally reviewed and interpreted daily labs, tele strips, imagings as discussed above. I reviewed all nursing notes, pharmacy notes, vitals, pertinent old records I have discussed plan of care as described above with RN and patient/family.  CBC: Recent Labs  Lab 05/22/23 1304 05/23/23 0539  WBC 8.3 8.2  NEUTROABS 6.1 7.4  HGB 12.5* 11.5*  HCT 40.2 37.7*  MCV 99.5 100.8*  PLT 205 230   Basic Metabolic Panel: Recent Labs  Lab 05/22/23 1304  05/22/23 1529 05/23/23 0539  NA 139  --  140  K 2.9*  --  3.5  CL 91*  --  95*  CO2 32  --  34*  GLUCOSE 152*  --  196*  BUN 11  --  13  CREATININE 0.83  --  0.87  CALCIUM 9.3  --  9.0  MG  --  1.9 1.9  PHOS  --   --  1.7*    Studies: US Venous Img Lower Bilateral (DVT) Result Date: 05/23/2023 CLINICAL DATA:  Lower extremity edema, shortness of breath EXAM: BILATERAL LOWER EXTREMITY VENOUS DOPPLER ULTRASOUND TECHNIQUE: Gray-scale sonography with graded compression, as well as color Doppler and duplex ultrasound were performed to evaluate the lower extremity deep venous systems from the level of the common femoral vein and including the common femoral, femoral, profunda femoral, popliteal and calf veins including the posterior tibial, peroneal and gastrocnemius veins when visible. The superficial great saphenous vein was also interrogated. Spectral Doppler was utilized to evaluate flow at rest and with distal augmentation maneuvers in the common femoral, femoral and popliteal veins. COMPARISON:  None Available. FINDINGS: RIGHT  LOWER EXTREMITY Common Femoral Vein: No evidence of thrombus. Normal compressibility, respiratory phasicity and response to augmentation. Saphenofemoral Junction: No evidence of thrombus. Normal compressibility and flow on color Doppler imaging. Profunda Femoral Vein: No evidence of thrombus. Normal compressibility and flow on color Doppler imaging. Femoral Vein: No evidence of thrombus. Normal compressibility, respiratory phasicity and response to augmentation. Popliteal Vein: No evidence of thrombus. Normal compressibility, respiratory phasicity and response to augmentation. Calf Veins: No evidence of thrombus. Normal compressibility and flow on color Doppler imaging. Superficial Great Saphenous Vein: No evidence of thrombus. Normal compressibility. Venous Reflux:  None. Other Findings:  None. LEFT LOWER EXTREMITY Common Femoral Vein: No evidence of thrombus. Normal  compressibility, respiratory phasicity and response to augmentation. Saphenofemoral Junction: No evidence of thrombus. Normal compressibility and flow on color Doppler imaging. Profunda Femoral Vein: No evidence of thrombus. Normal compressibility and flow on color Doppler imaging. Femoral Vein: No evidence of thrombus. Normal compressibility, respiratory phasicity and response to augmentation. Popliteal Vein: No evidence of thrombus. Normal compressibility, respiratory phasicity and response to augmentation. Calf Veins: No evidence of thrombus. Normal compressibility and flow on color Doppler imaging. Superficial Great Saphenous Vein: No evidence of thrombus. Normal compressibility. Venous Reflux:  None. Other Findings:  None. IMPRESSION: No evidence of deep venous thrombosis in either lower extremity. Electronically Signed   By: Malachy Moan M.D.   On: 05/23/2023 12:30   CT Head Wo Contrast Result Date: 05/22/2023 CLINICAL DATA:  slurred speech present for months, no prior imaging, evaluating for prior CVA EXAM: CT HEAD WITHOUT CONTRAST TECHNIQUE: Contiguous axial images were obtained from the base of the skull through the vertex without intravenous contrast. RADIATION DOSE REDUCTION: This exam was performed according to the departmental dose-optimization program which includes automated exposure control, adjustment of the mA and/or kV according to patient size and/or use of iterative reconstruction technique. COMPARISON:  Head CT 01/28/2018 FINDINGS: Brain: No evidence of acute infarction, hemorrhage, hydrocephalus, extra-axial collection or mass lesion/mass effect. No evidence of encephalomalacia or remote infarct. Brain volume is normal for age, mild atrophy is stable. Vascular: Atherosclerosis of skullbase vasculature without hyperdense vessel or abnormal calcification. Skull: No fracture or focal lesion. Sinuses/Orbits: Chronic mucosal thickening throughout the ethmoid air cells. Moderate mucosal  thickening within the maxillary sinuses. No mastoid effusion. Other: None. IMPRESSION: 1. No acute intracranial abnormality. No evidence of acute or remote ischemia. 2. Chronic sinusitis. Electronically Signed   By: Narda Rutherford M.D.   On: 05/22/2023 18:01    Scheduled Meds:  amitriptyline  25 mg Oral QHS   atorvastatin  40 mg Oral Daily   cyanocobalamin  1,000 mcg Intramuscular Q1200   Followed by   Melene Muller ON 05/30/2023] vitamin B-12  1,000 mcg Oral Daily   enoxaparin (LOVENOX) injection  50 mg Subcutaneous Q24H   fluticasone furoate-vilanterol  1 puff Inhalation Daily   insulin aspart  0-15 Units Subcutaneous TID WC   ipratropium-albuterol  3 mL Nebulization Q6H   levothyroxine  150 mcg Oral Q0600   paliperidone  3 mg Oral QHS   pantoprazole  40 mg Oral Daily   potassium chloride  20 mEq Oral Once   predniSONE  40 mg Oral Q breakfast   sodium chloride flush  3 mL Intravenous Q12H   thiamine  100 mg Oral Daily   traZODone  100 mg Oral QHS   venlafaxine  100 mg Oral BID   Vitamin D (Ergocalciferol)  50,000 Units Oral Q7 days   Continuous Infusions:  sodium  chloride Stopped (05/23/23 0353)   PRN Meds: sodium chloride, acetaminophen  Time spent: 55 minutes  Author: Gillis Santa. MD [facility] 05/23/2023 4:33 PM  To reach On-call, see care teams to locate the attending and reach out to them via [email or web address]. If 7PM-7AM, please contact night-coverage If you still have difficulty reaching the attending provider, please page the [facility] (Director on Call) for [facility] on amion for assistance.

## 2023-05-23 NOTE — ED Notes (Addendum)
Pt yelling for help at this time. Upon entering rm pt is leaning on the side of the bed. Pt bleeding from IV being pulled out. Pt gently lowered to the floor by this RN. Pt cleaned up. X5 assist to get the pt off the floor and onto the bed. New chux and brief applied. Pt back in bed resting at this time.

## 2023-05-24 DIAGNOSIS — J9621 Acute and chronic respiratory failure with hypoxia: Secondary | ICD-10-CM | POA: Diagnosis not present

## 2023-05-24 LAB — CBG MONITORING, ED
Glucose-Capillary: 95 mg/dL (ref 70–99)
Glucose-Capillary: 92 mg/dL (ref 70–99)
Glucose-Capillary: 153 mg/dL — ABNORMAL HIGH (ref 70–99)

## 2023-05-24 LAB — CBC
HCT: 36.7 % — ABNORMAL LOW (ref 39.0–52.0)
Hemoglobin: 11.6 g/dL — ABNORMAL LOW (ref 13.0–17.0)
MCH: 31.2 pg (ref 26.0–34.0)
MCHC: 31.6 g/dL (ref 30.0–36.0)
MCV: 98.7 fL (ref 80.0–100.0)
Platelets: 190 10*3/uL (ref 150–400)
RBC: 3.72 MIL/uL — ABNORMAL LOW (ref 4.22–5.81)
RDW: 14.6 % (ref 11.5–15.5)
WBC: 8.3 10*3/uL (ref 4.0–10.5)
nRBC: 0 % (ref 0.0–0.2)

## 2023-05-24 LAB — BASIC METABOLIC PANEL
Anion gap: 9 (ref 5–15)
BUN: 14 mg/dL (ref 8–23)
CO2: 35 mmol/L — ABNORMAL HIGH (ref 22–32)
Calcium: 9.5 mg/dL (ref 8.9–10.3)
Chloride: 98 mmol/L (ref 98–111)
Creatinine, Ser: 0.65 mg/dL (ref 0.61–1.24)
GFR, Estimated: >60 mL/min (ref >60)
Glucose, Bld: 98 mg/dL (ref 70–99)
Potassium: 3.2 mmol/L — ABNORMAL LOW (ref 3.5–5.1)
Sodium: 142 mmol/L (ref 135–145)

## 2023-05-24 LAB — PHOSPHORUS: Phosphorus: 3.2 mg/dL (ref 2.5–4.6)

## 2023-05-24 LAB — MAGNESIUM: Magnesium: 2.1 mg/dL (ref 1.7–2.4)

## 2023-05-24 MED ORDER — FUROSEMIDE 10 MG/ML IJ SOLN
40.0000 mg | Freq: Two times a day (BID) | INTRAMUSCULAR | Status: DC
  Administered 2023-05-24 – 2023-05-29 (×9): 40 mg via INTRAVENOUS
  Filled 2023-05-24 (×9): qty 4

## 2023-05-24 MED ORDER — POTASSIUM CHLORIDE CRYS ER 20 MEQ PO TBCR
40.0000 meq | EXTENDED_RELEASE_TABLET | Freq: Once | ORAL | Status: AC
  Administered 2023-05-24: 40 meq via ORAL
  Filled 2023-05-24: qty 2

## 2023-05-24 NOTE — Progress Notes (Signed)
[facility] Progress Note  Patient: Christopher Mason    UJW:[id number]  DOA: 05/22/2023     Date of Service: the patient was seen and examined on 05/24/2023  Chief Complaint  Patient presents with   Shortness of Breath   Brief hospital course: Christopher Mason is a 63 y.o. male with medical history significant of COPD, chronic hypoxic respiratory failure on 4L, schizophrenia, previous spinal surgery, hypothyroidism, hypothyroidism, bipolar disorder, tardive dyskinesia who presents to the ED with c/o SOB.    Mr. Corpus states that he has been experiencing worsening shortness of breath, productive cough and congestion for the last several days.  He denies any fever, chills, nausea, vomiting, abdominal pain.  He endorses lower extremity swelling and orthopnea, but states both are chronic.  He denies any chest pain, palpitations.   ED Course:  On arrival to the ED, patient was hypertensive at 136/118 with heart rate of 79.  He was saturating at 92% on 6 L, but was subsequently placed on BiPAP due to increased work of breathing.  He was afebrile at 97.9.  Initial workup notable for hemoglobin of 12.5, potassium 2.9, glucose 152, creatinine 0.83 with GFR above 60.  BNP within normal limits at 90.9.  Troponin negative x 2.  Procalcitonin negative.  COVID-19, influenza and RSV PCR negative.  Chest x-ray with low lung volumes, however bilateral interstitial opacities concerning for edema versus atelectasis.  CT head was obtained with no acute cranial abnormality.  Patient started on azithromycin, ceftriaxone, DuoNebs, and Solu-Medrol.  [facility] contacted for admission.   Assessment and Plan:  # Acute on chronic hypoxic respiratory failure  Patient is on 2 to 3 L oxygen at home COPD 0 patient could be due to viral infection Chest x-ray has low lung volumes, but is otherwise concerning for possible vascular congestion.   TTE normal in May 2024 and normal BNP, which may be due to morbid obesity. S/p  BiPAP due to work of breathing, continue as needed Continue supplemental O2 admission and gradually wean down to baseline 2 to 3 L Currently patient is on faltered oxygen S/p one-time dose of azithromycin and ceftriaxone.  WBC count within normal range, procalcitonin negative, less likely infection, hold off antibiotics. RVP panel negative, negative COVID flu and RSV S/p Trial of low-dose IV Lasix   # COPD exacerbation (HCC) Severe COPD exacerbation, potentially due to viral infection. - S/p Solu-Medrol 125 mg once - Start prednisone 40 mg tomorrow to complete a 5-day course - RVP negative - DuoNebs every 6 hours - Started Breo Ellipta inhaler Mucinex 600 mg p.o. twice daily, Tussionex as needed D-dimer 0.71, slightly elevated, less likely PE, venous duplex negative for DVT  # Hypokalemia, potassium repleted. # Hypophosphatemia, nutritional deficiency, Phos repleted. Monitor electrolytes and replete as needed.  # HTN (hypertension) - Hold home antihypertensives given blood pressures on the lower end of normal   # Lower extremity edema +2 pitting edema bilaterally, in the absence of a diagnosis of heart failure.  May be due to uncontrolled hypothyroidism. Venous duplex negative for DVT  - Trial of Lasix as noted above - TSH 16 elevated and free T4 level 1.0 wnl 1/30 started Lasix 40 mg IV twice daily  # Hypothyroidism Last TSH and free T4 in November 2024 demonstrating uncontrolled hypothyroidism. - Resume home Synthroid 150 mcg pod - TSH 16 elevated and free T4 level 1.0 wnl Recommend to follow-up with PCP and endocrinologist as an outpatient to titrate the dose of Synthroid  accordingly.  # Schizophrenia (HCC) - Resume home regimen   # Tardive dyskinesia - Resume home regimen  # Vitamin B12 level 250, lower end, goal >400: Started vitamin B12 1000 mcg IM injection daily during hospital stay, followed by oral supplement.  Follow-up PCP to repeat vitamin B12 level after 3 to  6 months. # Vitamin D level 26.8 insufficiency: started vitamin D 50,000 units p.o. weekly, follow with PCP to repeat vitamin D level after 3 to 6 months.   Body mass index is 36.26 kg/m.  Interventions:  Diet: Heart healthy/carb modified diet DVT Prophylaxis: Subcutaneous Lovenox   Advance goals of care discussion: Full code  Family Communication: family was not present at bedside, at the time of interview.  The pt provided permission to discuss medical plan with the family. Opportunity was given to ask question and all questions were answered satisfactorily.   Disposition:  Pt is from Home, admitted with SOB, still has Resp failure, which precludes a safe discharge. Discharge to Home, when stable.  Subjective: No significant events overnight, patient still has productive cough and shortness of breath, currently on 6 L oxygen via nasal cannula. Denied any chest pain or palpitation, no any other active issues. Patient still has significant edema in the lower extremities   Physical Exam: General: NAD, lying comfortably Appear in no distress, affect appropriate Eyes: PERRLA ENT: Oral Mucosa Clear, moist  Neck: no JVD,  Cardiovascular: S1 and S2 Present, no Murmur,  Respiratory: Equal air entry bilaterally, mild crackles and mild wheezes Abdomen: Bowel Sound present, Soft and no tenderness,  Skin: no rashes Extremities: 3+ Pedal edema, no calf tenderness Neurologic: without any new focal findings Gait not checked due to patient safety concerns  Vitals:   05/24/23 0630 05/24/23 1000 05/24/23 1215 05/24/23 1300  BP: (!) 147/83 (!) 167/94  (!) 166/91  Pulse: 66 71  71  Resp: (!) 21 (!) 23  (!) 25  Temp:   98.2 F (36.8 C)   TempSrc:   Oral   SpO2: 96% 94%  93%  Weight:      Height:       No intake or output data in the 24 hours ending 05/24/23 1619  Filed Weights   05/22/23 1241  Weight: 105 kg    Data Reviewed: I have personally reviewed and interpreted daily  labs, tele strips, imagings as discussed above. I reviewed all nursing notes, pharmacy notes, vitals, pertinent old records I have discussed plan of care as described above with RN and patient/family.  CBC: Recent Labs  Lab 05/22/23 1304 05/23/23 0539 05/24/23 0517  WBC 8.3 8.2 8.3  NEUTROABS 6.1 7.4  --   HGB 12.5* 11.5* 11.6*  HCT 40.2 37.7* 36.7*  MCV 99.5 100.8* 98.7  PLT 205 230 190   Basic Metabolic Panel: Recent Labs  Lab 05/22/23 1304 05/22/23 1529 05/23/23 0539 05/24/23 0517  NA 139  --  140 142  K 2.9*  --  3.5 3.2*  CL 91*  --  95* 98  CO2 32  --  34* 35*  GLUCOSE 152*  --  196* 98  BUN 11  --  13 14  CREATININE 0.83  --  0.87 0.65  CALCIUM 9.3  --  9.0 9.5  MG  --  1.9 1.9 2.1  PHOS  --   --  1.7* 3.2    Studies: No results found.   Scheduled Meds:  amitriptyline  25 mg Oral QHS   atorvastatin  40 mg  Oral Daily   cyanocobalamin  1,000 mcg Intramuscular Q1200   Followed by   Melene Muller ON 05/30/2023] vitamin B-12  1,000 mcg Oral Daily   enoxaparin (LOVENOX) injection  50 mg Subcutaneous Q24H   fluticasone furoate-vilanterol  1 puff Inhalation Daily   guaiFENesin  600 mg Oral BID   insulin aspart  0-15 Units Subcutaneous TID WC   ipratropium-albuterol  3 mL Nebulization Q6H   levothyroxine  150 mcg Oral Q0600   paliperidone  3 mg Oral QHS   pantoprazole  40 mg Oral Daily   predniSONE  40 mg Oral Q breakfast   sodium chloride flush  3 mL Intravenous Q12H   thiamine  100 mg Oral Daily   traZODone  100 mg Oral QHS   venlafaxine  100 mg Oral BID   Vitamin D (Ergocalciferol)  50,000 Units Oral Q7 days   Continuous Infusions:   PRN Meds: acetaminophen, chlorpheniramine-HYDROcodone  Time spent: 55 minutes  Author: Gillis Santa. MD [facility] 05/24/2023 4:19 PM  To reach On-call, see care teams to locate the attending and reach out to them via [email or web address]. If 7PM-7AM, please contact night-coverage If you still have difficulty reaching the  attending provider, please page the [facility] (Director on Call) for [facility] on amion for assistance.

## 2023-05-24 NOTE — ED Notes (Signed)
Pt is wet at this time, brief, bedding and chucks changed. Assisted Pt to urinate in urinal. Pt back to sleep at this time

## 2023-05-24 NOTE — ED Notes (Signed)
Pt reports his brief is wet, brief and chuck changed. Pt wants to go back to sleep again at this time

## 2023-05-24 NOTE — TOC Progression Note (Signed)
Transition of Care [facility]) - Progression Note    Patient Details  Name: Christopher Mason MRN: [id number] Date of Birth: 1960-07-16  [facility] CM/SW Contact  Tory Emerald, [place] Phone Number: 05/24/2023, 12:58 PM  Clinical Narrative:     CSW noted [facility] consult; however, CSW unable to complete consult until PT/OT complete assessment and make recommendations. CSW asked attending to re-consult TOC once PT/OT assess and make recs. CSW closing consult.         Expected Discharge Plan and Services                                               Social Determinants of Health (SDOH) Interventions SDOH Screenings   Food Insecurity: No Food Insecurity (08/16/2022)  Housing: Low Risk  (08/16/2022)  Transportation Needs: No Transportation Needs (08/16/2022)  Utilities: Not At Risk (08/16/2022)  Alcohol Screen: Low Risk  (09/06/2021)  Depression (PHQ2-9): Low Risk  (09/06/2021)  Tobacco Use: Medium Risk (05/22/2023)    Readmission Risk Interventions     No data to display

## 2023-05-24 NOTE — Progress Notes (Signed)
PHARMACY CONSULT NOTE - ELECTROLYTES  Pharmacy Consult for Electrolyte Monitoring and Replacement   Recent Labs: Height: 5\' 7"  (170.2 cm) Weight: 105 kg (231 lb 7.7 oz) IBW/kg (Calculated) : 66.1 Estimated Creatinine Clearance: 109.2 mL/min (by C-G formula based on SCr of 0.65 mg/dL). Potassium (mmol/L)  Date Value  05/24/2023 3.2 (L)   Magnesium (mg/dL)  Date Value  84/69/6295 2.1   Calcium (mg/dL)  Date Value  28/41/3244 9.5   Albumin (g/dL)  Date Value  04/26/7251 4.3   Phosphorus (mg/dL)  Date Value  66/44/0347 3.2   Sodium (mmol/L)  Date Value  05/24/2023 142  03/19/2023 141    Assessment  Christopher Mason is a 63 y.o. male presenting with SOB. PMH significant for COPD chronically on 2 L, HTN, GERD, and hypothyroidism. Pharmacy has been consulted to monitor and replace electrolytes.  Diet: heart healthy MIVF: N/A Pertinent medications: N/A  Goal of Therapy: Electrolytes WNL  Plan:  K 3.2   Will order KCL 40 meq po x 1 Check BMP, Mg, Phos with AM labs  Thank you for allowing pharmacy to be a part of this patient's care.  Angelique Blonder, PharmD Clinical Pharmacist 05/24/2023 7:31 AM

## 2023-05-24 NOTE — ED Notes (Signed)
This Registered Nurse (RN) assumed responsibility for the care of the assigned patient at 1900 on 05/24/23. All nursing tasks, documentation, and responsibilities prior to this time are not the responsibility of this RN. Any assessments, interventions, or documentation completed before this time are not within the scope of this RN's duties.  Effective 1900 on 05/24/23, this RN is now accountable for all aspects of patient care, including but not limited to assessments, interventions, documentation, medication administration, and care coordination. All future tasks and documentation will be managed and completed by this RN from this time until care handoff at 0700 on 05/25/23.

## 2023-05-24 NOTE — Evaluation (Signed)
Physical Therapy Evaluation Patient Details Name: Christopher Mason MRN: [id number] DOB: 10/06/1960 Today's Date: 05/24/2023  History of Present Illness  Pt is a 63 y.o. male presenting to hospital 05/22/23 with c/o SOB and fatigue.  Pt admitted with acute on chronic respiratory failure with hypoxia, COPD exacerbation, LE edema.  PMH includes h/o COPD on 4 L home O2, htn, schizophrenia, h/o spinal sx, bipolar disorder, tardive dyskinesia.  Clinical Impression  PT/OT co-evaluation performed.  Prior to recent medical concerns, pt was modified independent with ambulation using rollator; lives alone in 1 level home (level entry apt on 1st floor); pt reports trying to set up 24/7 assist and HHPT services.  No c/o pain during session.  Currently pt is mod assist semi-supine to sitting EOB; CGA x2 for safety to stand from elevated ED stretcher bed and take some steps with RW use (pt unsteady at times requiring assist for safety).  Activity limited d/t SOB and fatigue (SpO2 sats 90% or greater on 5 L O2 via nasal cannula during sessions activities).   Pt would currently benefit from skilled PT to address noted impairments and functional limitations (see below for any additional details).  Upon hospital discharge, pt would benefit from ongoing therapy.     If plan is discharge home, recommend the following: A little help with walking and/or transfers;A little help with bathing/dressing/bathroom;Assistance with cooking/housework;Assist for transportation;Help with stairs or ramp for entrance   Can travel by private vehicle        Equipment Recommendations Rolling walker (2 wheels)  Recommendations for Other Services       Functional Status Assessment Patient has had a recent decline in their functional status and demonstrates the ability to make significant improvements in function in a reasonable and predictable amount of time.     Precautions / Restrictions Precautions Precautions:  Fall Restrictions Weight Bearing Restrictions Per Provider Order: No      Mobility  Bed Mobility Overal bed mobility: Needs Assistance Bed Mobility: Supine to Sit, Sit to Supine     Supine to sit: Mod assist, HOB elevated Sit to supine: Contact guard assist   General bed mobility comments: mod assist for trunk semi-supine to sitting EOB; CGA for safety sit to supine    Transfers Overall transfer level: Needs assistance Equipment used: Rolling walker (2 wheels) Transfers: Sit to/from Stand Sit to Stand: Contact guard assist, +2 safety/equipment           General transfer comment: x2 trials standing from higher surface (ED stretcher bed) with RW use    Ambulation/Gait Ambulation/Gait assistance: Contact guard assist, +2 safety/equipment Gait Distance (Feet):  (pt took 2 steps forward, 2 steps backwards, and 2 lateral steps to R along bed) Assistive device: Rolling walker (2 wheels)   Gait velocity: decreased     General Gait Details: pt took 2 steps forward, 2 steps backwards, and 2 lateral steps to R along bed; after sitting rest break pt stood again and took steps in place (x20 reps B LE's); pt unsteady at times (CGA x2 for safety)  Stairs            Wheelchair Mobility     Tilt Bed    Modified Rankin (Stroke Patients Only)       Balance Overall balance assessment: Needs assistance Sitting-balance support: No upper extremity supported, Feet supported Sitting balance-Leahy Scale: Good Sitting balance - Comments: steady reaching within BOS   Standing balance support: Bilateral upper extremity supported, During functional activity, Reliant on  assistive device for balance Standing balance-Leahy Scale: Fair Standing balance comment: steady static standing with B UE support on RW; 2 assist for safety taking steps in place with B UE support on RW (d/t unsteadiness at times)                             Pertinent Vitals/Pain Pain  Assessment Pain Assessment: No/denies pain HR 84 bpm at rest beginning of session and 88 bpm at rest end of session.    Home Living Family/patient expects to be discharged to:: Private residence Living Arrangements: Alone (Has 1 dog Binnie Kand)) Available Help at Discharge: Friend(s);Available PRN/intermittently Type of Home: Apartment (1st floor) Home Access: Level entry       Home Layout: One level Home Equipment: Rollator (4 wheels);Grab bars - tub/shower Additional Comments: Pt reports working on getting HHPT and 24/7 care.    Prior Function Prior Level of Function : Independent/Modified Independent             Mobility Comments: Modified independent ambulating with rollator; no recent falls reported (other than being lowered to floor yesterday--pt reported having BM and panicked). ADLs Comments: Independent with ADL's.  Has assist for IADL's (driving, groceries).     Extremity/Trunk Assessment   Upper Extremity Assessment Upper Extremity Assessment: Defer to OT evaluation    Lower Extremity Assessment Lower Extremity Assessment: Generalized weakness    Cervical / Trunk Assessment Cervical / Trunk Assessment: Other exceptions Cervical / Trunk Exceptions: forward head/shoulders  Communication   Communication Communication: Other (comment) (Pt speech not clear (difficult to understand at times)) Cueing Techniques: Verbal cues  Cognition Arousal: Alert Behavior During Therapy: WFL for tasks assessed/performed Overall Cognitive Status: Within Functional Limits for tasks assessed                                 General Comments: Pleasant and participatory        General Comments  Nursing cleared pt for participation in physical therapy.  Pt agreeable to PT session.     Exercises     Assessment/Plan    PT Assessment Patient needs continued PT services  PT Problem List Decreased strength;Decreased activity tolerance;Decreased  balance;Decreased mobility;Cardiopulmonary status limiting activity       PT Treatment Interventions DME instruction;Gait training;Functional mobility training;Therapeutic activities;Therapeutic exercise;Balance training;Patient/family education    PT Goals (Current goals can be found in the Care Plan section)  Acute Rehab PT Goals Patient Stated Goal: to improve breathing PT Goal Formulation: With patient Time For Goal Achievement: 06/07/23 Potential to Achieve Goals: Fair    Frequency Min 1X/week     Co-evaluation PT/OT/SLP Co-Evaluation/Treatment: Yes Reason for Co-Treatment: For patient/therapist safety;To address functional/ADL transfers PT goals addressed during session: Mobility/safety with mobility;Balance;Proper use of DME OT goals addressed during session: ADL's and self-care       AM-PAC PT "6 Clicks" Mobility  Outcome Measure Help needed turning from your back to your side while in a flat bed without using bedrails?: A Little Help needed moving from lying on your back to sitting on the side of a flat bed without using bedrails?: A Lot Help needed moving to and from a bed to a chair (including a wheelchair)?: A Little Help needed standing up from a chair using your arms (e.g., wheelchair or bedside chair)?: A Little Help needed to walk in hospital room?: A Lot Help needed climbing  3-5 steps with a railing? : A Lot 6 Click Score: 15    End of Session Equipment Utilized During Treatment: Gait belt Activity Tolerance: Other (comment) (Limited d/t SOB/fatigue) Patient left: in bed;with call bell/phone within reach;Other (comment) (in ED stretcher bed with B railings up; bed in lowest position) Nurse Communication: Mobility status;Precautions PT Visit Diagnosis: Unsteadiness on feet (R26.81);Other abnormalities of gait and mobility (R26.89);Muscle weakness (generalized) (M62.81);History of falling (Z91.81)    Time: 5621-3086 PT Time Calculation (min) (ACUTE ONLY): 22  min   Charges:   PT Evaluation $PT Eval Low Complexity: 1 Low PT Treatments $Therapeutic Activity: 8-22 mins PT General Charges $$ ACUTE PT VISIT: 1 Visit        Hendricks Limes, PT 05/24/23, 4:29 PM

## 2023-05-24 NOTE — Evaluation (Signed)
Occupational Therapy Evaluation Patient Details Name: Christopher Mason MRN: [id number] DOB: [birth year] Today's Date: 05/24/2023   History of Present Illness Pt is a 63 y.o. male presenting to hospital 05/22/23 with c/o SOB and fatigue.  Pt admitted with acute on chronic respiratory failure with hypoxia, COPD exacerbation, LE edema.  PMH includes h/o COPD on 4 L home O2, htn, schizophrenia, h/o spinal sx, bipolar disorder, tardive dyskinesia.   Clinical Impression   Pt was seen for OT evaluation and co-tx with PT this date. Pt received seated EOB with PT. Prior to hospital admission, pt was living alone but endorses attempts recently to get additional assist/care. No family present to verify/clarify.  Pt presents to acute OT demonstrating impaired ADL performance and functional mobility 2/2 decreased balance, strength, and activity tolerance, requiring 5L (4L at baseline per pt report) (See OT problem list for additional functional deficits). Pt currently requires CGA+2 for ADL transfers and short distance mobility with RW, MIN A For LB ADL tasks, and VC for PLB during session. Pt would benefit from skilled OT services to address noted impairments and functional limitations (see below for any additional details) in order to maximize safety and independence while minimizing falls risk and caregiver burden.     If plan is discharge home, recommend the following: A little help with walking and/or transfers;A little help with bathing/dressing/bathroom;Assistance with cooking/housework;Assist for transportation;Help with stairs or ramp for entrance    Functional Status Assessment  Patient has had a recent decline in their functional status and demonstrates the ability to make significant improvements in function in a reasonable and predictable amount of time.  Equipment Recommendations  Other (comment) (defer)    Recommendations for Other Services       Precautions / Restrictions  Precautions Precautions: Fall Restrictions Weight Bearing Restrictions Per Provider Order: No      Mobility Bed Mobility Overal bed mobility: Needs Assistance Bed Mobility: Supine to Sit, Sit to Supine     Supine to sit: Mod assist, HOB elevated Sit to supine: Contact guard assist   General bed mobility comments: mod assist for trunk semi-supine to sitting EOB; CGA for safety sit to supine    Transfers Overall transfer level: Needs assistance Equipment used: Rolling walker (2 wheels) Transfers: Sit to/from Stand Sit to Stand: Contact guard assist, +2 safety/equipment           General transfer comment: x2 trials standing from higher surface (ED stretcher bed) with RW use      Balance Overall balance assessment: Needs assistance Sitting-balance support: No upper extremity supported, Feet supported Sitting balance-Leahy Scale: Good Sitting balance - Comments: steady reaching within BOS   Standing balance support: Bilateral upper extremity supported, During functional activity, Reliant on assistive device for balance Standing balance-Leahy Scale: Fair Standing balance comment: steady static standing with B UE support on RW; 2 assist for safety taking steps in place with B UE support on RW (d/t unsteadiness at times)                           ADL either performed or assessed with clinical judgement   ADL Overall ADL's : Needs assistance/impaired                                       General ADL Comments: Pt currently requires MIN A for LB ADL, CGA +2  for short distance ADL mobility with RW. VC for PLB.     Vision         Perception         Praxis         Pertinent Vitals/Pain Pain Assessment Pain Assessment: No/denies pain     Extremity/Trunk Assessment Upper Extremity Assessment Upper Extremity Assessment: Generalized weakness   Lower Extremity Assessment Lower Extremity Assessment: Generalized weakness   Cervical /  Trunk Assessment Cervical / Trunk Assessment: Other exceptions Cervical / Trunk Exceptions: forward head/shoulders   Communication Communication Communication: Other (comment) (a bit slurred) Cueing Techniques: Verbal cues   Cognition Arousal: Alert Behavior During Therapy: WFL for tasks assessed/performed Overall Cognitive Status: Within Functional Limits for tasks assessed                                       General Comments  SpO2 >90% on 5L with exertion    Exercises Other Exercises Other Exercises: Educated in PLB   Shoulder Instructions      Home Living Family/patient expects to be discharged to:: Private residence Living Arrangements: Alone (Has 1 dog Binnie Kand)) Available Help at Discharge: Friend(s);Available PRN/intermittently Type of Home: Apartment (1st floor) Home Access: Level entry     Home Layout: One level     Bathroom Shower/Tub: Chief Strategy Officer: Standard     Home Equipment: Rollator (4 wheels);Grab bars - tub/shower   Additional Comments: Pt reports working on getting HHPT and 24/7 care.      Prior Functioning/Environment Prior Level of Function : Independent/Modified Independent             Mobility Comments: Modified independent ambulating with rollator; no recent falls reported (other than being lowered to floor yesterday--pt reported having BM and panicked). ADLs Comments: Independent with ADL's.  Has assist for IADL's (driving, groceries).        OT Problem List: Decreased strength;Cardiopulmonary status limiting activity;Decreased activity tolerance;Impaired balance (sitting and/or standing);Decreased knowledge of use of DME or AE;Obesity      OT Treatment/Interventions: Self-care/ADL training;Therapeutic exercise;Therapeutic activities;Energy conservation;DME and/or AE instruction;Patient/family education;Balance training    OT Goals(Current goals can be found in the care plan section) Acute  Rehab OT Goals Patient Stated Goal: get better and go home OT Goal Formulation: With patient Time For Goal Achievement: 06/07/23 Potential to Achieve Goals: Good ADL Goals Pt Will Perform Lower Body Dressing: sit to/from stand;with modified independence Pt Will Transfer to Toilet: with modified independence;ambulating (LRAD) Pt Will Perform Toileting - Clothing Manipulation and hygiene: with modified independence;sitting/lateral leans  OT Frequency: Min 1X/week    Co-evaluation PT/OT/SLP Co-Evaluation/Treatment: Yes Reason for Co-Treatment: For patient/therapist safety;To address functional/ADL transfers PT goals addressed during session: Mobility/safety with mobility;Balance;Proper use of DME OT goals addressed during session: ADL's and self-care      AM-PAC OT "6 Clicks" Daily Activity     Outcome Measure Help from another person eating meals?: None Help from another person taking care of personal grooming?: A Little Help from another person toileting, which includes using toliet, bedpan, or urinal?: A Little Help from another person bathing (including washing, rinsing, drying)?: A Little Help from another person to put on and taking off regular upper body clothing?: A Little Help from another person to put on and taking off regular lower body clothing?: A Little 6 Click Score: 19   End of Session Equipment Utilized During  Treatment: Oxygen;Gait belt;Rolling walker (2 wheels)  Activity Tolerance: Patient tolerated treatment well Patient left: in bed;with call bell/phone within reach  OT Visit Diagnosis: Other abnormalities of gait and mobility (R26.89);Muscle weakness (generalized) (M62.81)                Time: 2725-3664 OT Time Calculation (min): 15 min Charges:  OT General Charges $OT Visit: 1 Visit OT Evaluation $OT Eval Moderate Complexity: 1 Mod  Arman Filter., MPH, MS, OTR/L ascom [phone] 05/24/23, 4:48 PM

## 2023-05-25 ENCOUNTER — Encounter: Payer: Self-pay | Admitting: Pulmonary Disease

## 2023-05-25 ENCOUNTER — Inpatient Hospital Stay: Payer: Medicaid Other

## 2023-05-25 DIAGNOSIS — E034 Atrophy of thyroid (acquired): Secondary | ICD-10-CM

## 2023-05-25 DIAGNOSIS — J441 Chronic obstructive pulmonary disease with (acute) exacerbation: Secondary | ICD-10-CM | POA: Diagnosis not present

## 2023-05-25 DIAGNOSIS — J9621 Acute and chronic respiratory failure with hypoxia: Secondary | ICD-10-CM | POA: Diagnosis not present

## 2023-05-25 LAB — BLOOD GAS, ARTERIAL
Acid-Base Excess: 11.1 mmol/L — ABNORMAL HIGH (ref 0.0–2.0)
Bicarbonate: 39 mmol/L — ABNORMAL HIGH (ref 20.0–28.0)
O2 Content: 65 L/min
O2 Saturation: 89.8 %
Patient temperature: 37
pCO2 arterial: 66 mm[Hg] (ref 32–48)
pH, Arterial: 7.38 (ref 7.35–7.45)
pO2, Arterial: 57 mm[Hg] — ABNORMAL LOW (ref 83–108)

## 2023-05-25 LAB — BASIC METABOLIC PANEL
Anion gap: 7 (ref 5–15)
BUN: 15 mg/dL (ref 8–23)
CO2: 36 mmol/L — ABNORMAL HIGH (ref 22–32)
Calcium: 8.6 mg/dL — ABNORMAL LOW (ref 8.9–10.3)
Chloride: 98 mmol/L (ref 98–111)
Creatinine, Ser: 0.72 mg/dL (ref 0.61–1.24)
GFR, Estimated: >60 mL/min (ref >60)
Glucose, Bld: 103 mg/dL — ABNORMAL HIGH (ref 70–99)
Potassium: 4.2 mmol/L (ref 3.5–5.1)
Sodium: 141 mmol/L (ref 135–145)

## 2023-05-25 LAB — PHOSPHORUS: Phosphorus: 4 mg/dL (ref 2.5–4.6)

## 2023-05-25 LAB — CBG MONITORING, ED
Glucose-Capillary: 83 mg/dL (ref 70–99)
Glucose-Capillary: 101 mg/dL — ABNORMAL HIGH (ref 70–99)
Glucose-Capillary: 102 mg/dL — ABNORMAL HIGH (ref 70–99)

## 2023-05-25 LAB — MAGNESIUM: Magnesium: 2.3 mg/dL (ref 1.7–2.4)

## 2023-05-25 LAB — PROCALCITONIN: Procalcitonin: <0.1 ng/mL

## 2023-05-25 LAB — GLUCOSE, CAPILLARY: Glucose-Capillary: 237 mg/dL — ABNORMAL HIGH (ref 70–99)

## 2023-05-25 MED ORDER — SODIUM CHLORIDE 0.9 % IV SOLN
500.0000 mg | INTRAVENOUS | Status: AC
  Administered 2023-05-25 – 2023-05-29 (×5): 500 mg via INTRAVENOUS
  Filled 2023-05-25 (×5): qty 5

## 2023-05-25 MED ORDER — IOHEXOL 350 MG/ML SOLN
75.0000 mL | Freq: Once | INTRAVENOUS | Status: AC | PRN
  Administered 2023-05-25: 75 mL via INTRAVENOUS

## 2023-05-25 MED ORDER — BUDESONIDE 0.5 MG/2ML IN SUSP
0.5000 mg | Freq: Two times a day (BID) | RESPIRATORY_TRACT | Status: DC
  Administered 2023-05-25 – 2023-06-01 (×15): 0.5 mg via RESPIRATORY_TRACT
  Filled 2023-05-25 (×15): qty 2

## 2023-05-25 MED ORDER — METHYLPREDNISOLONE SODIUM SUCC 125 MG IJ SOLR
80.0000 mg | Freq: Every day | INTRAMUSCULAR | Status: DC
  Administered 2023-05-25 – 2023-05-27 (×3): 80 mg via INTRAVENOUS
  Filled 2023-05-25 (×3): qty 2

## 2023-05-25 MED ORDER — IPRATROPIUM-ALBUTEROL 0.5-2.5 (3) MG/3ML IN SOLN
3.0000 mL | RESPIRATORY_TRACT | Status: DC
  Administered 2023-05-25 – 2023-05-26 (×4): 3 mL via RESPIRATORY_TRACT
  Filled 2023-05-25 (×4): qty 3

## 2023-05-25 MED ORDER — AMLODIPINE BESYLATE 5 MG PO TABS
5.0000 mg | ORAL_TABLET | Freq: Every day | ORAL | Status: DC
  Administered 2023-05-25 – 2023-06-01 (×7): 5 mg via ORAL
  Filled 2023-05-25 (×8): qty 1

## 2023-05-25 NOTE — ED Notes (Signed)
He wanted to eat.  Changed mask to canula--2 liters and ox went from 100 to upper 80s.

## 2023-05-25 NOTE — Progress Notes (Signed)
[facility] Progress Note  Patient: Christopher Mason    JXB:[id number]  DOA: 05/22/2023     Date of Service: the patient was seen and examined on 05/25/2023  Chief Complaint  Patient presents with   Shortness of Breath   Brief hospital course: Christopher Mason is a 63 y.o. male with medical history significant of COPD, chronic hypoxic respiratory failure on 4L, schizophrenia, previous spinal surgery, hypothyroidism, hypothyroidism, bipolar disorder, tardive dyskinesia who presents to the ED with c/o SOB.    Christopher Mason states that he has been experiencing worsening shortness of breath, productive cough and congestion for the last several days.  He denies any fever, chills, nausea, vomiting, abdominal pain.  He endorses lower extremity swelling and orthopnea, but states both are chronic.  He denies any chest pain, palpitations.   ED Course:  On arrival to the ED, patient was hypertensive at 136/118 with heart rate of 79.  He was saturating at 92% on 6 L, but was subsequently placed on BiPAP due to increased work of breathing.  He was afebrile at 97.9.  Initial workup notable for hemoglobin of 12.5, potassium 2.9, glucose 152, creatinine 0.83 with GFR above 60.  BNP within normal limits at 90.9.  Troponin negative x 2.  Procalcitonin negative.  COVID-19, influenza and RSV PCR negative.  Chest x-ray with low lung volumes, however bilateral interstitial opacities concerning for edema versus atelectasis.  CT head was obtained with no acute cranial abnormality.  Patient started on azithromycin, ceftriaxone, DuoNebs, and Solu-Medrol.  [facility] contacted for admission.   Assessment and Plan:  # Acute on chronic hypoxic respiratory failure  Patient is on 2 to 3 L oxygen at home COPD 0 patient could be due to viral infection Chest x-ray has low lung volumes, but is otherwise concerning for possible vascular congestion.   TTE normal in May 2024 and normal BNP, which may be due to morbid obesity. S/p  BiPAP due to work of breathing, continue as needed Continue supplemental O2 admission and gradually wean down to baseline 2 to 3 L S/p one-time dose of azithromycin and ceftriaxone.  WBC count within normal range, procalcitonin negative, less likely infection, hold off antibiotics. RVP panel negative, negative COVID flu and RSV S/p Trial of low-dose IV Lasix   # COPD exacerbation  Severe COPD exacerbation, potentially due to viral infection. - S/p Solu-Medrol 125 mg once - Start prednisone 40 mg tomorrow to complete a 5-day course - RVP negative - DuoNebs every 6 hours - Started Breo Ellipta inhaler Mucinex 600 mg p.o. twice daily, Tussionex as needed D-dimer 0.71, slightly elevated, less likely PE, venous duplex negative for DVT 1/31 respiratory failure got worse, critical care involved Follow CTA chest to rule out DVT   # Hypokalemia, potassium repleted. # Hypophosphatemia, nutritional deficiency, Phos repleted. Monitor electrolytes and replete as needed.  # HTN (hypertension) - Hold home antihypertensives given blood pressures on the lower end of normal   # Lower extremity edema +2 pitting edema bilaterally, in the absence of a diagnosis of heart failure.  May be due to uncontrolled hypothyroidism. Venous duplex negative for DVT  - Trial of Lasix as noted above - TSH 16 elevated and free T4 level 1.0 wnl 1/30 started Lasix 40 mg IV twice daily  # Hypothyroidism Last TSH and free T4 in November 2024 demonstrating uncontrolled hypothyroidism. - Resume home Synthroid 150 mcg pod - TSH 16 elevated and free T4 level 1.0 wnl Recommend to follow-up with PCP and  endocrinologist as an outpatient to titrate the dose of Synthroid accordingly.  # Schizophrenia - Resume home regimen   # Tardive dyskinesia - Resume home regimen  # Vitamin B12 level 250, lower end, goal >400: Started vitamin B12 1000 mcg IM injection daily during hospital stay, followed by oral supplement.  Follow-up  PCP to repeat vitamin B12 level after 3 to 6 months. # Vitamin D level 26.8 insufficiency: started vitamin D 50,000 units p.o. weekly, follow with PCP to repeat vitamin D level after 3 to 6 months.   Body mass index is 36.26 kg/m.  Interventions:  Diet: Heart healthy/carb modified diet DVT Prophylaxis: Subcutaneous Lovenox   Advance goals of care discussion: Full code  Family Communication: family was not present at bedside, at the time of interview.  The pt provided permission to discuss medical plan with the family. Opportunity was given to ask question and all questions were answered satisfactorily.   Disposition:  Pt is from Home, admitted with SOB, still has Resp failure, which precludes a safe discharge. Discharge to Home, when stable.  Subjective: No significant events overnight, in the morning the patient was having worsening of shortness of breath, patient was placed on nonrebreather.  Denied any chest pain or palpitation, no any other active issues. Christopher Mason agreed for the CT scan.  Physical Exam: General: Mild to moderate respiratory distress affect appropriate  Eyes: PERRLA ENT: Oral Mucosa Clear, moist  Neck: no JVD,  Cardiovascular: S1 and S2 Present, no Murmur,  Respiratory: Equal air entry bilaterally, B/L crackles and mild wheezes Abdomen: Bowel Sound present, Soft and no tenderness,  Skin: no rashes Extremities: 3+ Pedal edema, no calf tenderness Neurologic: without any new focal findings Gait not checked due to patient safety concerns  Vitals:   05/25/23 0756 05/25/23 1330 05/25/23 1400 05/25/23 1423  BP:  106/72 121/73   Pulse:  83 77 84  Resp:  14 (!) 25 17  Temp: 98.2 F (36.8 C)     TempSrc: Oral     SpO2:  98% 100% 93%  Weight:      Height:        Intake/Output Summary (Last 24 hours) at 05/25/2023 1443 Last data filed at 05/25/2023 1120 Gross per 24 hour  Intake --  Output 351 ml  Net -351 ml    Filed Weights   05/22/23 1241  Weight: 105  kg    Data Reviewed: I have personally reviewed and interpreted daily labs, tele strips, imagings as discussed above. I reviewed all nursing notes, pharmacy notes, vitals, pertinent old records I have discussed plan of care as described above with RN and patient/family.  CBC: Recent Labs  Lab 05/22/23 1304 05/23/23 0539 05/24/23 0517  WBC 8.3 8.2 8.3  NEUTROABS 6.1 7.4  --   HGB 12.5* 11.5* 11.6*  HCT 40.2 37.7* 36.7*  MCV 99.5 100.8* 98.7  PLT 205 230 190   Basic Metabolic Panel: Recent Labs  Lab 05/22/23 1304 05/22/23 1529 05/23/23 0539 05/24/23 0517 05/25/23 0459  NA 139  --  140 142 141  K 2.9*  --  3.5 3.2* 4.2  CL 91*  --  95* 98 98  CO2 32  --  34* 35* 36*  GLUCOSE 152*  --  196* 98 103*  BUN 11  --  13 14 15   CREATININE 0.83  --  0.87 0.65 0.72  CALCIUM 9.3  --  9.0 9.5 8.6*  MG  --  1.9 1.9 2.1 2.3  PHOS  --   --  1.7* 3.2 4.0    Studies: No results found.   Scheduled Meds:  amitriptyline  25 mg Oral QHS   amLODipine  5 mg Oral Daily   atorvastatin  40 mg Oral Daily   budesonide (PULMICORT) nebulizer solution  0.5 mg Nebulization BID   cyanocobalamin  1,000 mcg Intramuscular Q1200   Followed by   Melene Muller ON 05/30/2023] vitamin B-12  1,000 mcg Oral Daily   enoxaparin (LOVENOX) injection  50 mg Subcutaneous Q24H   furosemide  40 mg Intravenous BID   guaiFENesin  600 mg Oral BID   insulin aspart  0-15 Units Subcutaneous TID WC   ipratropium-albuterol  3 mL Nebulization Q4H   levothyroxine  150 mcg Oral Q0600   methylPREDNISolone (SOLU-MEDROL) injection  80 mg Intravenous Daily   paliperidone  3 mg Oral QHS   pantoprazole  40 mg Oral Daily   sodium chloride flush  3 mL Intravenous Q12H   thiamine  100 mg Oral Daily   traZODone  100 mg Oral QHS   venlafaxine  100 mg Oral BID   Vitamin D (Ergocalciferol)  50,000 Units Oral Q7 days   Continuous Infusions:  azithromycin 500 mg (05/25/23 1416)    PRN Meds: acetaminophen,  chlorpheniramine-HYDROcodone  Time spent: 55 minutes  Author: Gillis Santa. MD [facility] 05/25/2023 2:43 PM  To reach On-call, see care teams to locate the attending and reach out to them via [email or web address]. If 7PM-7AM, please contact night-coverage If you still have difficulty reaching the attending provider, please page the [facility] (Director on Call) for [facility] on amion for assistance.

## 2023-05-25 NOTE — ED Notes (Signed)
Had moderate amount soft brown stool.  Cleaned and changed brief.

## 2023-05-25 NOTE — Progress Notes (Signed)
PT Cancellation Note  Patient Details Name: Christopher Mason MRN: [id number] DOB: July 20, 1960   Cancelled Treatment:    Reason Eval/Treat Not Completed: Other (comment).  Pt pending CT angio chest to r/o PE.  Will hold PT at this time, monitor for results of testing, and re-attempt PT session at a later date/time as medically appropriate.  Hendricks Limes, PT 05/25/23, 2:19 PM

## 2023-05-25 NOTE — Progress Notes (Signed)
PHARMACIST - PHYSICIAN COMMUNICATION   CONCERNING: Methylprednisolone IV    Current order: Methylprednisolone IV 40 mg BID     DESCRIPTION: Per [facility] Protocol:   IV methylprednisolone will be converted to either a q12h or q24h frequency with the same total daily dose (TDD).  Ordered Dose: 1 to 125 mg TDD; convert to: TDD q24h.  Ordered Dose: 126 to 250 mg TDD; convert to: TDD div q12h.  Ordered Dose: >250 mg TDD; DAW.  Order has been adjusted to: Methylprednisolone IV 80 mg q24h   Barrie Folk , PharmD Clinical Pharmacist  05/25/2023 11:48 AM

## 2023-05-25 NOTE — ED Notes (Signed)
 Patient to CT at this time
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
## 2023-05-25 NOTE — Progress Notes (Signed)
PHARMACY CONSULT NOTE - ELECTROLYTES  Pharmacy Consult for Electrolyte Monitoring and Replacement   Recent Labs: Height: 5\' 7"  (170.2 cm) Weight: 105 kg (231 lb 7.7 oz) IBW/kg (Calculated) : 66.1 Estimated Creatinine Clearance: 109.2 mL/min (by C-G formula based on SCr of 0.72 mg/dL). Potassium (mmol/L)  Date Value  05/25/2023 4.2   Magnesium (mg/dL)  Date Value  16/01/9603 2.3   Calcium (mg/dL)  Date Value  54/12/8117 8.6 (L)   Albumin (g/dL)  Date Value  14/78/2956 4.3   Phosphorus (mg/dL)  Date Value  21/30/8657 4.0   Sodium (mmol/L)  Date Value  05/25/2023 141  03/19/2023 141    Assessment  Christopher Mason is a 63 y.o. male presenting with SOB. PMH significant for COPD chronically on 2 L, HTN, GERD, and hypothyroidism. Pharmacy has been consulted to monitor and replace electrolytes.  Diet: heart healthy MIVF: N/A Pertinent medications:      lasix 40mg  IV q12h  Goal of Therapy: Electrolytes WNL  Plan:  No electrolyte replacement at this time.  Check BMP, Mg, Phos with AM labs  Thank you for allowing pharmacy to be a part of this patient's care.  Angelique Blonder, PharmD Clinical Pharmacist 05/25/2023 7:40 AM

## 2023-05-25 NOTE — TOC Initial Note (Signed)
Transition of Care [facility]) - Initial/Assessment Note    Patient Details  Name: Christopher Mason MRN: [id number] Date of Birth: [birth year]  [facility]) CM/SW Contact:    Liliana Cline, LCSW Phone Number: 05/25/2023, 4:04 PM  Clinical Narrative:                 CSW met with patient at bedside in ED to discuss SNF recommendations. Patient is from home alone. PCP is [facility]. Patient has a rollator, grab bars, oxygen, and nebulizer at home. Patient went to [employer] recently for STR. Patient states he has a Financial controller who comes out 3 hours per day through his Medicaid. Patient refused SNF, states he would like to go home with home health and feels comfortable doing so despite SNF rec. Patient denied agency preference. CSW reached out to [place] with [facility] as they had patient in the past (per [employer]) - left a VM requesting a return call.   Expected Discharge Plan: Home w Home Health Services Barriers to Discharge: Continued Medical Work up   Patient Goals and CMS Choice Patient states their goals for this hospitalization and ongoing recovery are:: refuses SNF CMS Medicare.gov Compare Post Acute Care list provided to:: Patient Choice offered to / list presented to : Patient      Expected Discharge Plan and Services       Living arrangements for the past 2 months: Single Family Home                                      Prior Living Arrangements/Services Living arrangements for the past 2 months: Single Family Home Lives with:: Self Patient language and need for interpreter reviewed:: Yes Do you feel safe going back to the place where you live?: Yes      Need for Family Participation in Patient Care: Yes (Comment) Care giver support system in place?: Yes (comment) Current home services: DME Criminal Activity/Legal Involvement Pertinent to Current Situation/Hospitalization: No - Comment as needed  Activities of Daily Living       Permission Sought/Granted Permission sought to share information with : Facility Industrial/product designer granted to share information with : Yes, Verbal Permission Granted     Permission granted to share info w AGENCY: home health        Emotional Assessment       Orientation: : Oriented to Self, Oriented to Place, Oriented to  Time, Oriented to Situation Alcohol / Substance Use: Not Applicable Psych Involvement: No (comment)  Admission diagnosis:  Acute on chronic respiratory failure with hypoxia (HCC) [J96.21] Patient Active Problem List   Diagnosis Date Noted   Acute on chronic respiratory failure with hypoxia (HCC) 05/22/2023   Iron deficiency anemia 09/20/2022   Fall 09/13/2022   Pneumonia of left lower lobe due to infectious organism 09/13/2022   Atelectasis of both lungs 09/08/2022   Lumbar radiculopathy 09/06/2022   Obesity (BMI 30-39.9) 09/05/2022   Constipation 09/02/2022   Acute respiratory failure with hypoxia (HCC) 08/30/2022   Anasarca 08/30/2022   Lumbar radiculopathy, acute 08/17/2022   CAP (community acquired pneumonia) 08/16/2022   Left leg weakness 08/16/2022   Adenomatous polyp of colon 04/11/2022   Genetic testing 01/12/2020   History of colon polyps    Encounter for screening colonoscopy    Erectile dysfunction due to arterial insufficiency 12/09/2018   COPD exacerbation (HCC) 07/11/2018  Abdominal pain, left lower quadrant 10/31/2017   Acute cystitis without hematuria 10/31/2017   Fatigue 10/31/2017   Cigarette nicotine dependence without complication 10/31/2017   Acute right ankle pain 10/10/2017   Chronic pain of left knee 10/10/2017   Lower extremity edema 10/10/2017   Recurrent left inguinal hernia 10/10/2017   Schizophrenia (HCC) 07/03/2017   HLD (hyperlipidemia) 02/07/2015   COPD, group C, by GOLD 2017 classification (HCC) 02/07/2015   Hyponatremia 02/07/2015   AKI (acute kidney injury) (HCC) 11/20/2014   Hypothyroidism  09/17/2014   GERD (gastroesophageal reflux disease) 09/17/2014   Tobacco use disorder 09/17/2014   Asthma 09/16/2014   HTN (hypertension) 09/16/2014   Tardive dyskinesia 09/16/2014   PCP:  Sallyanne Kuster, NP Pharmacy:   [facility], [place] - [address] [place] [address] Phone: [phone] Fax: [phone]  [id number] [place], [place] - [address] AT [facility] OF [address] [place] [address] Phone: [phone] Fax: [phone]     Social Drivers of Health (SDOH) Social History: SDOH Screenings   Food Insecurity: No Food Insecurity (08/16/2022)  Housing: Low Risk  (08/16/2022)  Transportation Needs: No Transportation Needs (08/16/2022)  Utilities: Not At Risk (08/16/2022)  Alcohol Screen: Low Risk  (09/06/2021)  Depression (PHQ2-9): Low Risk  (09/06/2021)  Tobacco Use: Medium Risk (05/25/2023)   SDOH Interventions:     Readmission Risk Interventions     No data to display

## 2023-05-25 NOTE — ED Notes (Signed)
Patient ate a muffin and a banana.  I then put the NRB back on and ox came right back up.

## 2023-05-25 NOTE — ED Notes (Signed)
Escorted to and from CT by RN considering need for NRB at this time. Placed back in room 5 when finished with CT. All monitoring functioning, pt in 15L NRB and primary nurse, Misty Stanley notified.

## 2023-05-25 NOTE — ED Notes (Signed)
Spoke w/ RT who states to put pt on 15L NRB for transport and he will take heated hi-flo to room to set pt up on.

## 2023-05-26 ENCOUNTER — Encounter: Payer: Self-pay | Admitting: Internal Medicine

## 2023-05-26 ENCOUNTER — Inpatient Hospital Stay (HOSPITAL_COMMUNITY)
Admit: 2023-05-26 | Discharge: 2023-05-26 | Disposition: A | Discharge status: Home / Self Care | Payer: Medicaid Other | Attending: Pulmonary Disease

## 2023-05-26 DIAGNOSIS — J9621 Acute and chronic respiratory failure with hypoxia: Secondary | ICD-10-CM | POA: Diagnosis not present

## 2023-05-26 DIAGNOSIS — I5021 Acute systolic (congestive) heart failure: Secondary | ICD-10-CM | POA: Diagnosis not present

## 2023-05-26 DIAGNOSIS — E034 Atrophy of thyroid (acquired): Secondary | ICD-10-CM | POA: Diagnosis not present

## 2023-05-26 DIAGNOSIS — Z8612 Personal history of poliomyelitis: Secondary | ICD-10-CM | POA: Insufficient documentation

## 2023-05-26 DIAGNOSIS — J441 Chronic obstructive pulmonary disease with (acute) exacerbation: Secondary | ICD-10-CM | POA: Diagnosis not present

## 2023-05-26 LAB — BLOOD GAS, ARTERIAL
Acid-Base Excess: 16.6 mmol/L — ABNORMAL HIGH (ref 0.0–2.0)
Bicarbonate: 44.8 mmol/L — ABNORMAL HIGH (ref 20.0–28.0)
FIO2: 70 %
O2 Saturation: 89.6 %
Patient temperature: 37
pCO2 arterial: 69 mm[Hg] (ref 32–48)
pH, Arterial: 7.42 (ref 7.35–7.45)
pO2, Arterial: 56 mm[Hg] — ABNORMAL LOW (ref 83–108)

## 2023-05-26 LAB — BASIC METABOLIC PANEL
Anion gap: 9 (ref 5–15)
BUN: 18 mg/dL (ref 8–23)
CO2: 35 mmol/L — ABNORMAL HIGH (ref 22–32)
Calcium: 9.2 mg/dL (ref 8.9–10.3)
Chloride: 96 mmol/L — ABNORMAL LOW (ref 98–111)
Creatinine, Ser: 0.79 mg/dL (ref 0.61–1.24)
GFR, Estimated: >60 mL/min (ref >60)
Glucose, Bld: 122 mg/dL — ABNORMAL HIGH (ref 70–99)
Potassium: 4.3 mmol/L (ref 3.5–5.1)
Sodium: 140 mmol/L (ref 135–145)

## 2023-05-26 LAB — GLUCOSE, CAPILLARY
Glucose-Capillary: 118 mg/dL — ABNORMAL HIGH (ref 70–99)
Glucose-Capillary: 173 mg/dL — ABNORMAL HIGH (ref 70–99)
Glucose-Capillary: 151 mg/dL — ABNORMAL HIGH (ref 70–99)

## 2023-05-26 LAB — PHOSPHORUS: Phosphorus: 3.2 mg/dL (ref 2.5–4.6)

## 2023-05-26 LAB — MAGNESIUM: Magnesium: 2.3 mg/dL (ref 1.7–2.4)

## 2023-05-26 MED ORDER — BISACODYL 10 MG RE SUPP: 10.0000 mg | Freq: Every day | RECTAL | Status: DC | PRN

## 2023-05-26 MED ORDER — IPRATROPIUM-ALBUTEROL 0.5-2.5 (3) MG/3ML IN SOLN
3.0000 mL | Freq: Four times a day (QID) | RESPIRATORY_TRACT | Status: DC
  Administered 2023-05-26 – 2023-05-28 (×8): 3 mL via RESPIRATORY_TRACT
  Filled 2023-05-26 (×8): qty 3

## 2023-05-26 MED ORDER — BISACODYL 5 MG PO TBEC
10.0000 mg | DELAYED_RELEASE_TABLET | Freq: Every day | ORAL | Status: DC
  Administered 2023-05-28 – 2023-05-31 (×4): 10 mg via ORAL
  Filled 2023-05-26 (×5): qty 2

## 2023-05-26 MED ORDER — POLYETHYLENE GLYCOL 3350 17 G PO PACK
17.0000 g | PACK | Freq: Two times a day (BID) | ORAL | Status: DC
  Administered 2023-05-26 – 2023-06-01 (×10): 17 g via ORAL
  Filled 2023-05-26 (×12): qty 1

## 2023-05-26 MED ORDER — BISACODYL 5 MG PO TBEC
10.0000 mg | DELAYED_RELEASE_TABLET | Freq: Once | ORAL | Status: AC
  Administered 2023-05-26: 10 mg via ORAL
  Filled 2023-05-26: qty 2

## 2023-05-26 MED ORDER — ACETAZOLAMIDE SODIUM 500 MG IJ SOLR
250.0000 mg | Freq: Once | INTRAMUSCULAR | Status: AC
  Administered 2023-05-26: 250 mg via INTRAVENOUS
  Filled 2023-05-26: qty 250
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
## 2023-05-26 NOTE — Progress Notes (Signed)
PHARMACY CONSULT NOTE - ELECTROLYTES  Pharmacy Consult for Electrolyte Monitoring and Replacement   Recent Labs: Height: 5\' 7"  (170.2 cm) Weight: 105 kg (231 lb 7.7 oz) IBW/kg (Calculated) : 66.1 Estimated Creatinine Clearance: 109.2 mL/min (by C-G formula based on SCr of 0.79 mg/dL). Potassium (mmol/L)  Date Value  05/26/2023 4.3   Magnesium (mg/dL)  Date Value  19/14/7829 2.3   Calcium (mg/dL)  Date Value  56/21/3086 9.2   Albumin (g/dL)  Date Value  57/84/6962 4.3   Phosphorus (mg/dL)  Date Value  95/28/4132 3.2   Sodium (mmol/L)  Date Value  05/26/2023 140  03/19/2023 141    Assessment  Christopher Mason is a 63 y.o. male presenting with SOB. PMH significant for COPD chronically on 2 L, HTN, GERD, and hypothyroidism. Pharmacy has been consulted to monitor and replace electrolytes.  Diet: heart healthy MIVF: N/A Pertinent medications: lasix 40mg  IV q12h  Goal of Therapy: Electrolytes WNL  Plan:  No replacement needed.  F/u with AM labs.   Thank you for allowing pharmacy to be a part of this patient's care.  Ronnald Ramp, PharmD Clinical Pharmacist 05/26/2023 8:01 AM

## 2023-05-26 NOTE — Plan of Care (Signed)
  Problem: Education: Goal: Ability to describe self-care measures that may prevent or decrease complications (Diabetes Survival Skills Education) will improve Outcome: Progressing Goal: Individualized Educational Video(s) Outcome: Progressing   Problem: Education: Goal: Knowledge of General Education information will improve Description: Including pain rating scale, medication(s)/side effects and non-pharmacologic comfort measures Outcome: Progressing   Problem: Activity: Goal: Risk for activity intolerance will decrease Outcome: Progressing   Problem: Safety: Goal: Ability to remain free from injury will improve Outcome: Progressing   Problem: Respiratory: Goal: Ability to maintain a clear airway will improve Outcome: Progressing Goal: Levels of oxygenation will improve Outcome: Progressing Goal: Ability to maintain adequate ventilation will improve Outcome: Progressing

## 2023-05-26 NOTE — Plan of Care (Signed)

## 2023-05-26 NOTE — Progress Notes (Signed)
[facility] Progress Note  Patient: Christopher Mason    ZOX:[id number]  DOA: 05/22/2023     Date of Service: the patient was seen and examined on 05/26/2023  Chief Complaint  Patient presents with   Shortness of Breath   Brief hospital course: Christopher Mason is a 63 y.o. male with medical history significant of COPD, chronic hypoxic respiratory failure on 4L, schizophrenia, previous spinal surgery, hypothyroidism, hypothyroidism, bipolar disorder, tardive dyskinesia who presents to the ED with c/o SOB.    Christopher Mason states that he has been experiencing worsening shortness of breath, productive cough and congestion for the last several days.  He denies any fever, chills, nausea, vomiting, abdominal pain.  He endorses lower extremity swelling and orthopnea, but states both are chronic.  He denies any chest pain, palpitations.   ED Course:  On arrival to the ED, patient was hypertensive at 136/118 with heart rate of 79.  He was saturating at 92% on 6 L, but was subsequently placed on BiPAP due to increased work of breathing.  He was afebrile at 97.9.  Initial workup notable for hemoglobin of 12.5, potassium 2.9, glucose 152, creatinine 0.83 with GFR above 60.  BNP within normal limits at 90.9.  Troponin negative x 2.  Procalcitonin negative.  COVID-19, influenza and RSV PCR negative.  Chest x-ray with low lung volumes, however bilateral interstitial opacities concerning for edema versus atelectasis.  CT head was obtained with no acute cranial abnormality.  Patient started on azithromycin, ceftriaxone, DuoNebs, and Solu-Medrol.  [facility] contacted for admission.   Assessment and Plan:  # Acute on chronic hypoxic respiratory failure  Patient is on 2 to 3 L oxygen at home COPD 0 patient could be due to viral infection Chest x-ray has low lung volumes, but is otherwise concerning for possible vascular congestion.   TTE normal in May 2024 and normal BNP, which may be due to morbid obesity. S/p  BiPAP due to work of breathing, continue as needed Continue supplemental O2 admission and gradually wean down to baseline 2 to 3 L S/p one-time dose of azithromycin and ceftriaxone.  WBC count within normal range, procalcitonin negative, less likely infection, hold off antibiotics. RVP panel negative, negative COVID flu and RSV Continue IV Lasix Follow TTE   # COPD exacerbation  Severe COPD exacerbation, potentially due to viral infection. - S/p Solu-Medrol 125 mg once, s/p p.o. present on 1/31 due to worsening of shortness of breath, started Solu-Medrol 80 mg IV daily - RVP negative - DuoNebs every 6 hours - Started Breo Ellipta inhaler Mucinex 600 mg p.o. twice daily, Tussionex as needed D-dimer 0.71, slightly elevated, venous duplex negative for DVT CTA chest: Negative for acute PE or thoracic aortic dissection. 2. Bilateral upper lobe and bibasilar infiltrates, left greater than right. 3. Trace left pleural effusion. 1/31 respiratory failure got worse, pulmonary consulted 2/1 still in significant respiratory failure, patient was placed on BiPAP   # Hypokalemia, potassium repleted. # Hypophosphatemia, nutritional deficiency, Phos repleted. Monitor electrolytes and replete as needed.  # HTN (hypertension) - Hold home antihypertensives given blood pressures on the lower end of normal   # Lower extremity edema +2 pitting edema bilaterally, in the absence of a diagnosis of heart failure.  May be due to uncontrolled hypothyroidism. Venous duplex negative for DVT  - Trial of Lasix as noted above - TSH 16 elevated and free T4 level 1.0 wnl 1/30 started Lasix 40 mg IV twice daily  # Hypothyroidism Last TSH  and free T4 in November 2024 demonstrating uncontrolled hypothyroidism. - Resume home Synthroid 150 mcg pod - TSH 16 elevated and free T4 level 1.0 wnl Recommend to follow-up with PCP and endocrinologist as an outpatient to titrate the dose of Synthroid accordingly.  #  Schizophrenia - Resume home regimen   # Tardive dyskinesia - Resume home regimen  # Vitamin B12 level 250, lower end, goal >400: Started vitamin B12 1000 mcg IM injection daily during hospital stay, followed by oral supplement.  Follow-up PCP to repeat vitamin B12 level after 3 to 6 months. # Vitamin D level 26.8 insufficiency: started vitamin D 50,000 units p.o. weekly, follow with PCP to repeat vitamin D level after 3 to 6 months.  # Constipation, started laxatives, follow BM.  Body mass index is 36.26 kg/m.  Interventions:  Diet: Heart healthy/carb modified diet DVT Prophylaxis: Subcutaneous Lovenox   Advance goals of care discussion: Full code  Family Communication: family was not present at bedside, at the time of interview.  The pt provided permission to discuss medical plan with the family. Opportunity was given to ask question and all questions were answered satisfactorily.   Disposition:  Pt is from Home, admitted with SOB, still has Resp failure, which precludes a safe discharge. Discharge to Home, when stable.  Subjective: No significant events overnight, patient/significant respiratory failure, patient was placed on BiPAP.  Tolerating well.  Denied any chest pain or palpitation, no any other complaints.   Physical Exam: General: Mild to moderate respiratory distress affect appropriate  Eyes: PERRLA ENT: on Bipap Neck: no JVD,  Cardiovascular: S1 and S2 Present, no Murmur,  Respiratory: Equal air entry bilaterally, B/L crackles and mild wheezes Abdomen: Bowel Sound present, Soft and no tenderness,  Skin: no rashes Extremities: 2- 3+ Pedal edema, no calf tenderness Neurologic: without any new focal findings Gait not checked due to patient safety concerns  Vitals:   05/26/23 0819 05/26/23 0820 05/26/23 1220 05/26/23 1329  BP: 122/74  (!) 121/95   Pulse: 67  73   Resp: 17  17   Temp: 98.5 F (36.9 C)  97.9 F (36.6 C)   TempSrc: Oral  Oral   SpO2: 91% 90%  98% 90%  Weight:      Height:        Intake/Output Summary (Last 24 hours) at 05/26/2023 1412 Last data filed at 05/26/2023 1000 Gross per 24 hour  Intake 250 ml  Output 200 ml  Net 50 ml    Filed Weights   05/22/23 1241  Weight: 105 kg    Data Reviewed: I have personally reviewed and interpreted daily labs, tele strips, imagings as discussed above. I reviewed all nursing notes, pharmacy notes, vitals, pertinent old records I have discussed plan of care as described above with RN and patient/family.  CBC: Recent Labs  Lab 05/22/23 1304 05/23/23 0539 05/24/23 0517  WBC 8.3 8.2 8.3  NEUTROABS 6.1 7.4  --   HGB 12.5* 11.5* 11.6*  HCT 40.2 37.7* 36.7*  MCV 99.5 100.8* 98.7  PLT 205 230 190   Basic Metabolic Panel: Recent Labs  Lab 05/22/23 1304 05/22/23 1529 05/23/23 0539 05/24/23 0517 05/25/23 0459 05/26/23 0404  NA 139  --  140 142 141 140  K 2.9*  --  3.5 3.2* 4.2 4.3  CL 91*  --  95* 98 98 96*  CO2 32  --  34* 35* 36* 35*  GLUCOSE 152*  --  196* 98 103* 122*  BUN 11  --  13 14 15 18   CREATININE 0.83  --  0.87 0.65 0.72 0.79  CALCIUM 9.3  --  9.0 9.5 8.6* 9.2  MG  --  1.9 1.9 2.1 2.3 2.3  PHOS  --   --  1.7* 3.2 4.0 3.2    Studies: CT Angio Chest Pulmonary Embolism (PE) W or WO Contrast Result Date: 05/25/2023 CLINICAL DATA:  COPD, shortness of breath, rule out PE EXAM: CT ANGIOGRAPHY CHEST WITH CONTRAST TECHNIQUE: Multidetector CT imaging of the chest was performed using the standard protocol during bolus administration of intravenous contrast. Multiplanar CT image reconstructions and MIPs were obtained to evaluate the vascular anatomy. RADIATION DOSE REDUCTION: This exam was performed according to the departmental dose-optimization program which includes automated exposure control, adjustment of the mA and/or kV according to patient size and/or use of iterative reconstruction technique. CONTRAST:  75mL OMNIPAQUE IOHEXOL 350 MG/ML SOLN COMPARISON:  01/03/2022  and previous FINDINGS: Cardiovascular: Heart size normal. No pericardial effusion. The RVs nondilated. Satisfactory opacification of pulmonary arteries noted, and there is no evidence of pulmonary emboli. Scattered 3-vessel coronary calcifications. Adequate contrast opacification of the thoracic aorta with no evidence of dissection, aneurysm, or stenosis. There is classic 3-vessel brachiocephalic arch anatomy without proximal stenosis. Calcified plaque in the arch and descending thoracic aorta. Mediastinum/Nodes: No hematoma, mass, or mediastinal adenopathy. Single enlarged 1.3 cm right hilar lymph node, new since previous. Lungs/Pleura: Trace left pleural effusion. Pulmonary emphysema. Coarse airspace infiltrates posteriorly in both upper lobes, right greater than left. Dependent consolidation in the lung bases, left greater than right. Upper Abdomen: Cholecystectomy clips.  No acute findings. Musculoskeletal: Chronic mild vertebral compression deformities in the lower thoracic spine. Spondylitic changes in the visualized lower cervical spine. Review of the MIP images confirms the above findings. IMPRESSION: 1. Negative for acute PE or thoracic aortic dissection. 2. Bilateral upper lobe and bibasilar infiltrates, left greater than right. 3. Trace left pleural effusion. Electronically Signed   By: Corlis Leak M.D.   On: 05/25/2023 15:45     Scheduled Meds:  amitriptyline  25 mg Oral QHS   amLODipine  5 mg Oral Daily   atorvastatin  40 mg Oral Daily   budesonide (PULMICORT) nebulizer solution  0.5 mg Nebulization BID   cyanocobalamin  1,000 mcg Intramuscular Q1200   Followed by   Melene Muller ON 05/30/2023] vitamin B-12  1,000 mcg Oral Daily   enoxaparin (LOVENOX) injection  50 mg Subcutaneous Q24H   furosemide  40 mg Intravenous BID   guaiFENesin  600 mg Oral BID   insulin aspart  0-15 Units Subcutaneous TID WC   ipratropium-albuterol  3 mL Nebulization Q6H   levothyroxine  150 mcg Oral Q0600    methylPREDNISolone (SOLU-MEDROL) injection  80 mg Intravenous Daily   paliperidone  3 mg Oral QHS   pantoprazole  40 mg Oral Daily   sodium chloride flush  3 mL Intravenous Q12H   thiamine  100 mg Oral Daily   traZODone  100 mg Oral QHS   venlafaxine  100 mg Oral BID   Vitamin D (Ergocalciferol)  50,000 Units Oral Q7 days   Continuous Infusions:  azithromycin 500 mg (05/26/23 1332)    PRN Meds: acetaminophen, chlorpheniramine-HYDROcodone  Time spent: 55 minutes  Author: Gillis Santa. MD [facility] 05/26/2023 2:12 PM  To reach On-call, see care teams to locate the attending and reach out to them via [email or web address]. If 7PM-7AM, please contact night-coverage If you still have difficulty reaching the attending provider, please page the  DOC (Director on Call) for [facility] on amion for assistance.

## 2023-05-26 NOTE — Progress Notes (Signed)
Physical Therapy Treatment Patient Details Name: Christopher Mason MRN: [id number] DOB: 11/16/1960 Today's Date: 05/26/2023   History of Present Illness Pt is a 64 y.o. male presenting to hospital 05/22/23 with c/o SOB and fatigue.  Pt admitted with acute on chronic respiratory failure with hypoxia, COPD exacerbation, LE edema.  PMH includes h/o COPD on 4 L home O2, htn, schizophrenia, h/o spinal sx, bipolar disorder, tardive dyskinesia.    PT Comments  Pt seen today for PT treatment; OOB mobility deferred due to increased O2 demand to 45L via [facility] and pt SpO2 at 90% upon PT entry. Pt agreeable for bed level therex. Continuous pulse ox applied during session.   Pt able to tolerate x10 reps of BLE therex, with increased rest breaks between for pacing. Moderate cues during session provided for pursed lip breathing. Isometric holds with glute sets deferred due to unintentional valsalva maneuver, dropping SpO2 to 88%. Bilateral heel cord stretches performed 3x20s for maintenance of ankle mobility and safe standing balance.  Pt will continue to benefit from skilled acute PT services to address deficits for return to baseline function. Will continue per POC.     If plan is discharge home, recommend the following: A little help with walking and/or transfers;A little help with bathing/dressing/bathroom;Assistance with cooking/housework;Assist for transportation;Help with stairs or ramp for entrance     Equipment Recommendations  Rolling walker (2 wheels)       Precautions / Restrictions Precautions Precautions: Fall Restrictions Weight Bearing Restrictions Per Provider Order: No Other Position/Activity Restrictions: SpO2 >/= 88-92%     Mobility  Bed Mobility               General bed mobility comments: OOB mobility deferred due to increased O2 demand (45L [facility]) and resting SpO2 at 90%. Pt agreeable for supine therex.         Balance       Sitting balance - Comments: OOB  mobility deferred due to increased O2 demand (45L [facility]) and resting SpO2 at 90%. Pt agreeable for supine therex.                                    Cognition Arousal: Alert Behavior During Therapy: WFL for tasks assessed/performed Overall Cognitive Status: Within Functional Limits for tasks assessed                                          Exercises Total Joint Exercises Ankle Circles/Pumps: AROM, Both, 10 reps Quad Sets: AROM, Left, 10 reps (unable to tolerate on R due to pain) Gluteal Sets: AROM, Both, 5 reps (deferred after 5 reps due to dropping SpO2 from unintential valsalva maneuver during isometric hold) Towel Squeeze: AROM, Both, 10 reps Short Arc Quad: AROM, Both, 10 reps Heel Slides: AROM, Both, 10 reps Hip ABduction/ADduction: AROM, Both, 10 reps Other Exercises Other Exercises: Pt educated re: PT role/POC, DC recommendations, safety with functional mobility, no valsalva maneuver, pursed lip breathing.    General Comments General comments (skin integrity, edema, etc.): SpO2 monitored continously throughout session ranging from 88-92% on 45L via [facility]      Pertinent Vitals/Pain Pain Assessment Pain Assessment: No/denies pain     PT Goals (current goals can now be found in the care plan section) Acute Rehab PT Goals Patient Stated Goal: to improve breathing  PT Goal Formulation: With patient Time For Goal Achievement: 06/07/23 Potential to Achieve Goals: Fair Progress towards PT goals: Not progressing toward goals - comment (currently requiring increased O2 demand from 5L via [place] to 45L via [facility])    Frequency    Min 1X/week       AM-PAC PT "6 Clicks" Mobility   Outcome Measure  Help needed turning from your back to your side while in a flat bed without using bedrails?: A Little Help needed moving from lying on your back to sitting on the side of a flat bed without using bedrails?: A Lot Help needed moving to and from a bed  to a chair (including a wheelchair)?: A Little Help needed standing up from a chair using your arms (e.g., wheelchair or bedside chair)?: A Little Help needed to walk in hospital room?: A Lot Help needed climbing 3-5 steps with a railing? : A Lot 6 Click Score: 15    End of Session   Activity Tolerance: Treatment limited secondary to medical complications (Comment) (limited by increased O2 demand) Patient left: in bed;with call bell/phone within reach;with bed alarm set Nurse Communication: Mobility status;Precautions PT Visit Diagnosis: Unsteadiness on feet (R26.81);Other abnormalities of gait and mobility (R26.89);Muscle weakness (generalized) (M62.81);History of falling (Z91.81)     Time: 1610-9604 PT Time Calculation (min) (ACUTE ONLY): 19 min  Charges:    $Therapeutic Exercise: 8-22 mins PT General Charges $$ ACUTE PT VISIT: 1 Visit                      Vira Blanco, PT, DPT 4:14 PM,05/26/23 Physical Therapist - [facility]

## 2023-05-27 DIAGNOSIS — J9621 Acute and chronic respiratory failure with hypoxia: Secondary | ICD-10-CM | POA: Diagnosis not present

## 2023-05-27 LAB — ECHOCARDIOGRAM COMPLETE
AR max vel: 3.74 cm2
AV Peak grad: 9.5 mm[Hg]
Ao pk vel: 1.55 m/s
Area-P 1/2: 4.17 cm2
Height: 67 in
S' Lateral: 3.3 cm
Weight: 3703.73 [oz_av]

## 2023-05-27 LAB — RENAL FUNCTION PANEL
Albumin: 3.4 g/dL — ABNORMAL LOW (ref 3.5–5.0)
Anion gap: 9 (ref 5–15)
BUN: 21 mg/dL (ref 8–23)
CO2: 34 mmol/L — ABNORMAL HIGH (ref 22–32)
Calcium: 9.6 mg/dL (ref 8.9–10.3)
Chloride: 96 mmol/L — ABNORMAL LOW (ref 98–111)
Creatinine, Ser: 0.9 mg/dL (ref 0.61–1.24)
GFR, Estimated: >60 mL/min (ref >60)
Glucose, Bld: 117 mg/dL — ABNORMAL HIGH (ref 70–99)
Phosphorus: 3.5 mg/dL (ref 2.5–4.6)
Potassium: 4 mmol/L (ref 3.5–5.1)
Sodium: 139 mmol/L (ref 135–145)

## 2023-05-27 LAB — BLOOD GAS, ARTERIAL
Acid-Base Excess: 12.1 mmol/L — ABNORMAL HIGH (ref 0.0–2.0)
Bicarbonate: 40.5 mmol/L — ABNORMAL HIGH (ref 20.0–28.0)
FIO2: 60 %
O2 Saturation: 94.5 %
Patient temperature: 37
pCO2 arterial: 70 mm[Hg] (ref 32–48)
pH, Arterial: 7.37 (ref 7.35–7.45)
pO2, Arterial: 64 mm[Hg] — ABNORMAL LOW (ref 83–108)

## 2023-05-27 LAB — CBC
HCT: 36.3 % — ABNORMAL LOW (ref 39.0–52.0)
Hemoglobin: 11.3 g/dL — ABNORMAL LOW (ref 13.0–17.0)
MCH: 30.8 pg (ref 26.0–34.0)
MCHC: 31.1 g/dL (ref 30.0–36.0)
MCV: 98.9 fL (ref 80.0–100.0)
Platelets: 210 10*3/uL (ref 150–400)
RBC: 3.67 MIL/uL — ABNORMAL LOW (ref 4.22–5.81)
RDW: 14.3 % (ref 11.5–15.5)
WBC: 9.9 10*3/uL (ref 4.0–10.5)
nRBC: 0 % (ref 0.0–0.2)

## 2023-05-27 LAB — GLUCOSE, CAPILLARY
Glucose-Capillary: 94 mg/dL (ref 70–99)
Glucose-Capillary: 163 mg/dL — ABNORMAL HIGH (ref 70–99)
Glucose-Capillary: 123 mg/dL — ABNORMAL HIGH (ref 70–99)
Glucose-Capillary: 146 mg/dL — ABNORMAL HIGH (ref 70–99)

## 2023-05-27 LAB — PHOSPHORUS: Phosphorus: 3.6 mg/dL (ref 2.5–4.6)

## 2023-05-27 LAB — MAGNESIUM: Magnesium: 2.6 mg/dL — ABNORMAL HIGH (ref 1.7–2.4)

## 2023-05-27 MED ORDER — PREDNISONE 20 MG PO TABS
30.0000 mg | ORAL_TABLET | Freq: Every day | ORAL | Status: DC
  Administered 2023-05-31 – 2023-06-01 (×2): 30 mg via ORAL
  Filled 2023-05-27 (×2): qty 1

## 2023-05-27 MED ORDER — PREDNISONE 20 MG PO TABS: 20.0000 mg | ORAL_TABLET | Freq: Every day | ORAL | Status: DC

## 2023-05-27 MED ORDER — PREDNISONE 10 MG PO TABS: 10.0000 mg | ORAL_TABLET | Freq: Every day | ORAL | Status: DC

## 2023-05-27 MED ORDER — METHYLPREDNISOLONE SODIUM SUCC 40 MG IJ SOLR
40.0000 mg | Freq: Every day | INTRAMUSCULAR | Status: AC
  Administered 2023-05-28 – 2023-05-30 (×3): 40 mg via INTRAVENOUS
  Filled 2023-05-27 (×3): qty 1

## 2023-05-27 NOTE — Plan of Care (Signed)

## 2023-05-27 NOTE — Progress Notes (Signed)
PHARMACY CONSULT NOTE - ELECTROLYTES  Pharmacy Consult for Electrolyte Monitoring and Replacement   Recent Labs: Height: 5\' 7"  (170.2 cm) Weight: 105 kg (231 lb 7.7 oz) IBW/kg (Calculated) : 66.1 Estimated Creatinine Clearance: 97.1 mL/min (by C-G formula based on SCr of 0.9 mg/dL). Potassium (mmol/L)  Date Value  05/27/2023 4.0   Magnesium (mg/dL)  Date Value  69/62/9528 2.6 (H)   Calcium (mg/dL)  Date Value  41/32/4401 9.6   Albumin (g/dL)  Date Value  02/72/5366 3.4 (L)  03/19/2023 4.3   Phosphorus (mg/dL)  Date Value  44/06/4740 3.5   Sodium (mmol/L)  Date Value  05/27/2023 139  03/19/2023 141    Assessment  Christopher Mason is a 63 y.o. male presenting with SOB. PMH significant for COPD chronically on 2 L, HTN, GERD, and hypothyroidism. Pharmacy has been consulted to monitor and replace electrolytes.  Diet: heart healthy MIVF: N/A Pertinent medications: lasix 40mg  IV q12h  Goal of Therapy: Electrolytes WNL  Plan:  No replacement needed.  F/u with AM labs.   Thank you for allowing pharmacy to be a part of this patient's care.  Ronnald Ramp, PharmD Clinical Pharmacist 05/27/2023 8:33 AM

## 2023-05-27 NOTE — Progress Notes (Signed)
[facility] Progress Note  Patient: Christopher Mason    ZOX:[id number]  DOA: 05/22/2023     Date of Service: the patient was seen and examined on 05/27/2023  Chief Complaint  Patient presents with   Shortness of Breath   Brief hospital course: Christopher Mason is a 63 y.o. male with medical history significant of COPD, chronic hypoxic respiratory failure on 4L, schizophrenia, previous spinal surgery, hypothyroidism, hypothyroidism, bipolar disorder, tardive dyskinesia who presents to the ED with c/o SOB.    Christopher Mason states that he has been experiencing worsening shortness of breath, productive cough and congestion for the last several days.  He denies any fever, chills, nausea, vomiting, abdominal pain.  He endorses lower extremity swelling and orthopnea, but states both are chronic.  He denies any chest pain, palpitations.   ED Course:  On arrival to the ED, patient was hypertensive at 136/118 with heart rate of 79.  He was saturating at 92% on 6 L, but was subsequently placed on BiPAP due to increased work of breathing.  He was afebrile at 97.9.  Initial workup notable for hemoglobin of 12.5, potassium 2.9, glucose 152, creatinine 0.83 with GFR above 60.  BNP within normal limits at 90.9.  Troponin negative x 2.  Procalcitonin negative.  COVID-19, influenza and RSV PCR negative.  Chest x-ray with low lung volumes, however bilateral interstitial opacities concerning for edema versus atelectasis.  CT head was obtained with no acute cranial abnormality.  Patient started on azithromycin, ceftriaxone, DuoNebs, and Solu-Medrol.  [facility] contacted for admission.   Assessment and Plan:  # Acute on chronic hypoxic respiratory failure with hypercapnia Patient is on 2 to 3 L oxygen at home COPD exacerbation patient could be due to viral infection Chest x-ray has low lung volumes, but is otherwise concerning for possible vascular congestion.   TTE normal in May 2024 and normal BNP, which may be  due to morbid obesity. S/p BiPAP due to work of breathing, continue as needed Continue supplemental O2 admission and gradually wean down to baseline 2 to 3 L S/p one-time dose of azithromycin and ceftriaxone.  WBC count within normal range, procalcitonin negative, less likely infection, so held antibiotics but restarted azithromycin as per pulm. RVP panel negative, negative COVID flu and RSV Continue IV Lasix Follow TTE   # COPD exacerbation  Severe COPD exacerbation, potentially due to viral infection. - S/p Solu-Medrol 125 mg once, s/p p.o. present on 1/31 due to worsening of shortness of breath, started Solu-Medrol 80 mg IV daily 2/3 start Solu-Medrol tapering and transition to prednisone tapering - RVP negative - DuoNebs every 6 hours - Started Breo Ellipta inhaler Mucinex 600 mg p.o. twice daily, Tussionex as needed D-dimer 0.71, slightly elevated, venous duplex negative for DVT CTA chest: Negative for acute PE or thoracic aortic dissection. 2. Bilateral upper lobe and bibasilar infiltrates, left greater than right. 3. Trace left pleural effusion. 1/31 respiratory failure got worse, pulmonary consulted 2/1 still in significant respiratory failure, patient was placed on BiPAP 2/2 ABG 7.37/70/60/12 0.1/40/94.5%, hypercarbic respiratory failure with compensation, patient did not wear BiPAP last night. Continue BiPAP,    # Hypokalemia, potassium repleted. # Hypophosphatemia, nutritional deficiency, Phos repleted. Monitor electrolytes and replete as needed.  # HTN (hypertension) BP improving, it was low Resumed amlodipine 5 mg p.o. daily Monitor BP and titrate medications accordingly    # Lower extremity edema +2 pitting edema bilaterally, in the absence of a diagnosis of heart failure.  May be  due to uncontrolled hypothyroidism. Venous duplex negative for DVT  - Trial of Lasix as noted above - TSH 16 elevated and free T4 level 1.0 wnl 1/30 started Lasix 40 mg IV twice  daily  # Hypothyroidism Last TSH and free T4 in November 2024 demonstrating uncontrolled hypothyroidism. - Resume home Synthroid 150 mcg pod - TSH 16 elevated and free T4 level 1.0 wnl Recommend to follow-up with PCP and endocrinologist as an outpatient to titrate the dose of Synthroid accordingly.  # Schizophrenia - Resume home regimen   # Tardive dyskinesia - Resume home regimen  # Vitamin B12 level 250, lower end, goal >400: Started vitamin B12 1000 mcg IM injection daily during hospital stay, followed by oral supplement.  Follow-up PCP to repeat vitamin B12 level after 3 to 6 months. # Vitamin D level 26.8 insufficiency: started vitamin D 50,000 units p.o. weekly, follow with PCP to repeat vitamin D level after 3 to 6 months.  # Constipation, started laxatives, constipation resolved, patient is moving bowels.   Body mass index is 36.26 kg/m.  Interventions:  Diet: Heart healthy/carb modified diet DVT Prophylaxis: Subcutaneous Lovenox   Advance goals of care discussion: Full code  Family Communication: family was not present at bedside, at the time of interview.  The pt provided permission to discuss medical plan with the family. Opportunity was given to ask question and all questions were answered satisfactorily.   Disposition:  Pt is from Home, admitted with SOB, still has Resp failure, which precludes a safe discharge. Discharge to Home, when stable.  Subjective: No significant events overnight, patient still very short of breath, did not wear BiPAP last night, unknown reason. In the morning time patient was on BiPAP after ABG report.  Patient is tolerating well, denied any active issues.   Physical Exam: General: Mild to moderate respiratory distress affect appropriate  Eyes: PERRLA ENT: on Bipap Neck: no JVD,  Cardiovascular: S1 and S2 Present, no Murmur,  Respiratory: Equal air entry bilaterally, B/L crackles and mild wheezes Abdomen: Bowel Sound present, Soft  and no tenderness,  Skin: no rashes Extremities: 2+ Pedal edema R>L, no calf tenderness, edema gradually improving Neurologic: without any new focal findings Gait not checked due to patient safety concerns  Vitals:   05/27/23 0730 05/27/23 0929 05/27/23 1240 05/27/23 1330  BP:  (!) 143/76 (!) 139/90   Pulse:  72 87   Resp:      Temp:  99.3 F (37.4 C) 98.6 F (37 C)   TempSrc:  Oral Oral   SpO2: 94% 90% 97% 93%  Weight:      Height:        Intake/Output Summary (Last 24 hours) at 05/27/2023 1346 Last data filed at 05/27/2023 1200 Gross per 24 hour  Intake --  Output 2975 ml  Net -2975 ml    Filed Weights   05/22/23 1241  Weight: 105 kg    Data Reviewed: I have personally reviewed and interpreted daily labs, tele strips, imagings as discussed above. I reviewed all nursing notes, pharmacy notes, vitals, pertinent old records I have discussed plan of care as described above with RN and patient/family.  CBC: Recent Labs  Lab 05/22/23 1304 05/23/23 0539 05/24/23 0517 05/27/23 0503  WBC 8.3 8.2 8.3 9.9  NEUTROABS 6.1 7.4  --   --   HGB 12.5* 11.5* 11.6* 11.3*  HCT 40.2 37.7* 36.7* 36.3*  MCV 99.5 100.8* 98.7 98.9  PLT 205 230 190 210   Basic Metabolic Panel:  Recent Labs  Lab 05/23/23 0539 05/24/23 0517 05/25/23 0459 05/26/23 0404 05/27/23 0503 05/27/23 0504  NA 140 142 141 140  --  139  K 3.5 3.2* 4.2 4.3  --  4.0  CL 95* 98 98 96*  --  96*  CO2 34* 35* 36* 35*  --  34*  GLUCOSE 196* 98 103* 122*  --  117*  BUN 13 14 15 18   --  21  CREATININE 0.87 0.65 0.72 0.79  --  0.90  CALCIUM 9.0 9.5 8.6* 9.2  --  9.6  MG 1.9 2.1 2.3 2.3 2.6*  --   PHOS 1.7* 3.2 4.0 3.2 3.6 3.5    Studies: No results found.    Scheduled Meds:  amitriptyline  25 mg Oral QHS   amLODipine  5 mg Oral Daily   atorvastatin  40 mg Oral Daily   bisacodyl  10 mg Oral QHS   budesonide (PULMICORT) nebulizer solution  0.5 mg Nebulization BID   cyanocobalamin  1,000 mcg Intramuscular  Q1200   Followed by   Melene Muller ON 05/30/2023] vitamin B-12  1,000 mcg Oral Daily   enoxaparin (LOVENOX) injection  50 mg Subcutaneous Q24H   furosemide  40 mg Intravenous BID   guaiFENesin  600 mg Oral BID   insulin aspart  0-15 Units Subcutaneous TID WC   ipratropium-albuterol  3 mL Nebulization Q6H   levothyroxine  150 mcg Oral Q0600   [START ON 05/28/2023] methylPREDNISolone (SOLU-MEDROL) injection  40 mg Intravenous Daily   Followed by   Melene Muller ON 05/31/2023] predniSONE  30 mg Oral Q breakfast   Followed by   Melene Muller ON 06/03/2023] predniSONE  20 mg Oral Q breakfast   Followed by   Melene Muller ON 06/06/2023] predniSONE  10 mg Oral Q breakfast   paliperidone  3 mg Oral QHS   pantoprazole  40 mg Oral Daily   polyethylene glycol  17 g Oral BID   sodium chloride flush  3 mL Intravenous Q12H   thiamine  100 mg Oral Daily   traZODone  100 mg Oral QHS   venlafaxine  100 mg Oral BID   Vitamin D (Ergocalciferol)  50,000 Units Oral Q7 days   Continuous Infusions:  azithromycin 500 mg (05/27/23 1248)    PRN Meds: acetaminophen, bisacodyl, chlorpheniramine-HYDROcodone  Time spent: 55 minutes  Author: Gillis Santa. MD [facility] 05/27/2023 1:46 PM  To reach On-call, see care teams to locate the attending and reach out to them via [email or web address]. If 7PM-7AM, please contact night-coverage If you still have difficulty reaching the attending provider, please page the [facility] (Director on Call) for [facility] on amion for assistance.

## 2023-05-28 ENCOUNTER — Ambulatory Visit: Payer: Medicaid Other | Admitting: Nurse Practitioner

## 2023-05-28 DIAGNOSIS — J9621 Acute and chronic respiratory failure with hypoxia: Secondary | ICD-10-CM | POA: Diagnosis not present

## 2023-05-28 LAB — CBC
HCT: 35.7 % — ABNORMAL LOW (ref 39.0–52.0)
Hemoglobin: 10.9 g/dL — ABNORMAL LOW (ref 13.0–17.0)
MCH: 30.5 pg (ref 26.0–34.0)
MCHC: 30.5 g/dL (ref 30.0–36.0)
MCV: 100 fL (ref 80.0–100.0)
Platelets: 219 10*3/uL (ref 150–400)
RBC: 3.57 MIL/uL — ABNORMAL LOW (ref 4.22–5.81)
RDW: 14.1 % (ref 11.5–15.5)
WBC: 8.8 10*3/uL (ref 4.0–10.5)
nRBC: 0 % (ref 0.0–0.2)

## 2023-05-28 LAB — BASIC METABOLIC PANEL
Anion gap: 10 (ref 5–15)
BUN: 28 mg/dL — ABNORMAL HIGH (ref 8–23)
CO2: 36 mmol/L — ABNORMAL HIGH (ref 22–32)
Calcium: 9.5 mg/dL (ref 8.9–10.3)
Chloride: 95 mmol/L — ABNORMAL LOW (ref 98–111)
Creatinine, Ser: 0.95 mg/dL (ref 0.61–1.24)
GFR, Estimated: >60 mL/min (ref >60)
Glucose, Bld: 116 mg/dL — ABNORMAL HIGH (ref 70–99)
Potassium: 4.4 mmol/L (ref 3.5–5.1)
Sodium: 141 mmol/L (ref 135–145)

## 2023-05-28 LAB — GLUCOSE, CAPILLARY
Glucose-Capillary: 100 mg/dL — ABNORMAL HIGH (ref 70–99)
Glucose-Capillary: 95 mg/dL (ref 70–99)
Glucose-Capillary: 125 mg/dL — ABNORMAL HIGH (ref 70–99)
Glucose-Capillary: 137 mg/dL — ABNORMAL HIGH (ref 70–99)
Glucose-Capillary: 122 mg/dL — ABNORMAL HIGH (ref 70–99)

## 2023-05-28 LAB — PHOSPHORUS: Phosphorus: 4.1 mg/dL (ref 2.5–4.6)

## 2023-05-28 LAB — MAGNESIUM: Magnesium: 2.5 mg/dL — ABNORMAL HIGH (ref 1.7–2.4)

## 2023-05-28 MED ORDER — IPRATROPIUM-ALBUTEROL 0.5-2.5 (3) MG/3ML IN SOLN
3.0000 mL | Freq: Three times a day (TID) | RESPIRATORY_TRACT | Status: DC
  Administered 2023-05-28 – 2023-06-01 (×13): 3 mL via RESPIRATORY_TRACT
  Filled 2023-05-28 (×13): qty 3

## 2023-05-28 MED ORDER — ARFORMOTEROL TARTRATE 15 MCG/2ML IN NEBU
15.0000 ug | INHALATION_SOLUTION | Freq: Two times a day (BID) | RESPIRATORY_TRACT | Status: DC
  Administered 2023-05-29 – 2023-06-01 (×7): 15 ug via RESPIRATORY_TRACT
  Filled 2023-05-28 (×8): qty 2

## 2023-05-28 NOTE — Plan of Care (Signed)

## 2023-05-28 NOTE — Progress Notes (Signed)
[facility] Progress Note  Patient: Christopher Mason    MVH:[id number]  DOA: 05/22/2023     Date of Service: the patient was seen and examined on 05/28/2023  Chief Complaint  Patient presents with   Shortness of Breath   Brief hospital course: Christopher Mason is a 63 y.o. male with medical history significant of COPD, chronic hypoxic respiratory failure on 4L, schizophrenia, previous spinal surgery, hypothyroidism, hypothyroidism, bipolar disorder, tardive dyskinesia who presents to the ED with c/o SOB.    Christopher Mason states that he has been experiencing worsening shortness of breath, productive cough and congestion for the last several days.  He denies any fever, chills, nausea, vomiting, abdominal pain.  He endorses lower extremity swelling and orthopnea, but states both are chronic.  He denies any chest pain, palpitations.   ED Course:  On arrival to the ED, patient was hypertensive at 136/118 with heart rate of 79.  He was saturating at 92% on 6 L, but was subsequently placed on BiPAP due to increased work of breathing.  He was afebrile at 97.9.  Initial workup notable for hemoglobin of 12.5, potassium 2.9, glucose 152, creatinine 0.83 with GFR above 60.  BNP within normal limits at 90.9.  Troponin negative x 2.  Procalcitonin negative.  COVID-19, influenza and RSV PCR negative.  Chest x-ray with low lung volumes, however bilateral interstitial opacities concerning for edema versus atelectasis.  CT head was obtained with no acute cranial abnormality.  Patient started on azithromycin, ceftriaxone, DuoNebs, and Solu-Medrol.  [facility] contacted for admission.   Assessment and Plan:  # Acute on chronic hypoxic respiratory failure with hypercapnia Patient is on 2 to 3 L oxygen at home COPD exacerbation patient could be due to viral infection Chest x-ray has low lung volumes, but is otherwise concerning for possible vascular congestion.   TTE normal in May 2024 and normal BNP, which may be  due to morbid obesity. S/p BiPAP due to work of breathing, continue as needed Continue supplemental O2 admission and gradually wean down to baseline 2 to 3 L S/p one-time dose of azithromycin and ceftriaxone.  WBC count within normal range, procalcitonin negative, less likely infection, so held antibiotics but restarted azithromycin as per pulm. RVP panel negative, negative COVID flu and RSV Continue IV Lasix 05/26/2023 TTE LVEF 55 to 60%, mild LV hypertrophy.  No any other significant findings and no significant change from prior study.   # COPD exacerbation  Severe COPD exacerbation, potentially due to viral infection. - S/p Solu-Medrol 125 mg once, s/p p.o. present on 1/31 due to worsening of shortness of breath, started Solu-Medrol 80 mg IV daily 2/3 start Solu-Medrol tapering and transition to prednisone tapering - RVP negative - DuoNebs every 6 hours - Started Breo Ellipta inhaler Mucinex 600 mg p.o. twice daily, Tussionex as needed D-dimer 0.71, slightly elevated, venous duplex negative for DVT CTA chest: Negative for acute PE or thoracic aortic dissection. 2. Bilateral upper lobe and bibasilar infiltrates, left greater than right. 3. Trace left pleural effusion. 1/31 respiratory failure got worse, pulmonary consulted 2/1 still in significant respiratory failure, patient was placed on BiPAP 2/2 ABG 7.37/70/60/12 0.1/40/94.5%, hypercarbic respiratory failure with compensation, patient did not wear BiPAP last night. Continue BiPAP,  2/3 started Brovana nebulizer twice daily, and continued Pulmicort nebulizer twice daily  # Hypokalemia, potassium repleted. # Hypophosphatemia, nutritional deficiency, Phos repleted. Monitor electrolytes and replete as needed.  # HTN (hypertension) BP improving, it was low Resumed amlodipine 5 mg  p.o. daily Monitor BP and titrate medications accordingly    # Lower extremity edema +2 pitting edema bilaterally, in the absence of a diagnosis of heart  failure.  May be due to uncontrolled hypothyroidism. Venous duplex negative for DVT  - Trial of Lasix as noted above - TSH 16 elevated and free T4 level 1.0 wnl 1/30 started Lasix 40 mg IV twice daily  # Hypothyroidism Last TSH and free T4 in November 2024 demonstrating uncontrolled hypothyroidism. - Resume home Synthroid 150 mcg pod - TSH 16 elevated and free T4 level 1.0 wnl Recommend to follow-up with PCP and endocrinologist as an outpatient to titrate the dose of Synthroid accordingly.  # Schizophrenia - Resume home regimen   # Tardive dyskinesia - Resume home regimen  # Vitamin B12 level 250, lower end, goal >400: Started vitamin B12 1000 mcg IM injection daily during hospital stay, followed by oral supplement.  Follow-up PCP to repeat vitamin B12 level after 3 to 6 months. # Vitamin D level 26.8 insufficiency: started vitamin D 50,000 units p.o. weekly, follow with PCP to repeat vitamin D level after 3 to 6 months.  # Constipation, started laxatives, constipation resolved, patient is moving bowels.   Body mass index is 36.26 kg/m.  Interventions:  Diet: Heart healthy/carb modified diet DVT Prophylaxis: Subcutaneous Lovenox   Advance goals of care discussion: Full code  Family Communication: family was not present at bedside, at the time of interview.  The pt provided permission to discuss medical plan with the family. Opportunity was given to ask question and all questions were answered satisfactorily.   Disposition:  Pt is from Home, admitted with SOB, still has Resp failure, which precludes a safe discharge. Discharge to Home, when stable.  Subjective: No significant events overnight, s/p BiPAP at night, patient is still on heated high flow oxygen, overall feels little bit improvement.  Denies any chest pain or palpitations. Patient was wondering when he could go home Currently patient is not stable, still on heated high flow oxygen so it may take couple of days to  improve.  Physical Exam: General: Mild to moderate respiratory distress affect appropriate  Eyes: PERRLA ENT: Clear and moist Neck: no JVD,  Cardiovascular: S1 and S2 Present, no Murmur,  Respiratory: Equal air entry bilaterally, B/L crackles and mild wheezes Abdomen: Bowel Sound present, Soft and no tenderness,  Skin: no rashes Extremities: 1-2+ Pedal edema R>L, no calf tenderness, edema gradually improving Neurologic: without any new focal findings Gait not checked due to patient safety concerns  Vitals:   05/28/23 0735 05/28/23 0913 05/28/23 1327 05/28/23 1424  BP:  134/81 139/75   Pulse:  62 80   Resp:      Temp:  98.8 F (37.1 C) 98.3 F (36.8 C)   TempSrc:  Oral    SpO2: 97% 98% 98% 95%  Weight:      Height:        Intake/Output Summary (Last 24 hours) at 05/28/2023 1522 Last data filed at 05/28/2023 1200 Gross per 24 hour  Intake --  Output 2750 ml  Net -2750 ml    Filed Weights   05/22/23 1241  Weight: 105 kg    Data Reviewed: I have personally reviewed and interpreted daily labs, tele strips, imagings as discussed above. I reviewed all nursing notes, pharmacy notes, vitals, pertinent old records I have discussed plan of care as described above with RN and patient/family.  CBC: Recent Labs  Lab 05/22/23 1304 05/23/23 0539 05/24/23 1610  05/27/23 0503 05/28/23 0250  WBC 8.3 8.2 8.3 9.9 8.8  NEUTROABS 6.1 7.4  --   --   --   HGB 12.5* 11.5* 11.6* 11.3* 10.9*  HCT 40.2 37.7* 36.7* 36.3* 35.7*  MCV 99.5 100.8* 98.7 98.9 100.0  PLT 205 230 190 210 219   Basic Metabolic Panel: Recent Labs  Lab 05/24/23 0517 05/25/23 0459 05/26/23 0404 05/27/23 0503 05/27/23 0504 05/28/23 0250  NA 142 141 140  --  139 141  K 3.2* 4.2 4.3  --  4.0 4.4  CL 98 98 96*  --  96* 95*  CO2 35* 36* 35*  --  34* 36*  GLUCOSE 98 103* 122*  --  117* 116*  BUN 14 15 18   --  21 28*  CREATININE 0.65 0.72 0.79  --  0.90 0.95  CALCIUM 9.5 8.6* 9.2  --  9.6 9.5  MG 2.1 2.3  2.3 2.6*  --  2.5*  PHOS 3.2 4.0 3.2 3.6 3.5 4.1    Studies: No results found.    Scheduled Meds:  amitriptyline  25 mg Oral QHS   amLODipine  5 mg Oral Daily   atorvastatin  40 mg Oral Daily   bisacodyl  10 mg Oral QHS   budesonide (PULMICORT) nebulizer solution  0.5 mg Nebulization BID   cyanocobalamin  1,000 mcg Intramuscular Q1200   Followed by   Melene Muller ON 05/30/2023] vitamin B-12  1,000 mcg Oral Daily   enoxaparin (LOVENOX) injection  50 mg Subcutaneous Q24H   furosemide  40 mg Intravenous BID   guaiFENesin  600 mg Oral BID   insulin aspart  0-15 Units Subcutaneous TID WC   ipratropium-albuterol  3 mL Nebulization TID   levothyroxine  150 mcg Oral Q0600   methylPREDNISolone (SOLU-MEDROL) injection  40 mg Intravenous Daily   Followed by   Melene Muller ON 05/31/2023] predniSONE  30 mg Oral Q breakfast   Followed by   Melene Muller ON 06/03/2023] predniSONE  20 mg Oral Q breakfast   Followed by   Melene Muller ON 06/06/2023] predniSONE  10 mg Oral Q breakfast   paliperidone  3 mg Oral QHS   pantoprazole  40 mg Oral Daily   polyethylene glycol  17 g Oral BID   sodium chloride flush  3 mL Intravenous Q12H   thiamine  100 mg Oral Daily   traZODone  100 mg Oral QHS   venlafaxine  100 mg Oral BID   Vitamin D (Ergocalciferol)  50,000 Units Oral Q7 days   Continuous Infusions:  azithromycin 500 mg (05/28/23 1212)    PRN Meds: acetaminophen, bisacodyl, chlorpheniramine-HYDROcodone  Time spent: 40 minutes  Author: Gillis Santa. MD [facility] 05/28/2023 3:22 PM  To reach On-call, see care teams to locate the attending and reach out to them via [email or web address]. If 7PM-7AM, please contact night-coverage If you still have difficulty reaching the attending provider, please page the [facility] (Director on Call) for [facility] on amion for assistance.

## 2023-05-28 NOTE — Progress Notes (Signed)
PHARMACY CONSULT NOTE - ELECTROLYTES  Pharmacy Consult for Electrolyte Monitoring and Replacement   Recent Labs: Height: 5\' 7"  (170.2 cm) Weight: 105 kg (231 lb 7.7 oz) IBW/kg (Calculated) : 66.1 Estimated Creatinine Clearance: 92 mL/min (by C-G formula based on SCr of 0.95 mg/dL). Potassium (mmol/L)  Date Value  05/28/2023 4.4   Magnesium (mg/dL)  Date Value  40/98/1191 2.5 (H)   Calcium (mg/dL)  Date Value  47/82/9562 9.5   Albumin (g/dL)  Date Value  13/11/6576 3.4 (L)  03/19/2023 4.3   Phosphorus (mg/dL)  Date Value  46/96/2952 4.1   Sodium (mmol/L)  Date Value  05/28/2023 141  03/19/2023 141    Assessment  Christopher Mason is a 63 y.o. male presenting with SOB. PMH significant for COPD chronically on 2 L, HTN, GERD, and hypothyroidism. Pharmacy has been consulted to monitor and replace electrolytes.  Diet: heart healthy MIVF: N/A Pertinent medications: lasix 40mg  IV q12h  Goal of Therapy: Electrolytes WNL  Plan:  No replacement needed.  F/u with AM labs.   Thank you for allowing pharmacy to be a part of this patient's care.  Ned Kakar Rodriguez-Guzman PharmD, BCPS 05/28/2023 9:03 AM

## 2023-05-28 NOTE — Plan of Care (Signed)
  Problem: Health Behavior/Discharge Planning: Goal: Ability to manage health-related needs will improve Outcome: Progressing   Problem: Skin Integrity: Goal: Risk for impaired skin integrity will decrease Outcome: Progressing   Problem: Education: Goal: Knowledge of General Education information will improve Description: Including pain rating scale, medication(s)/side effects and non-pharmacologic comfort measures Outcome: Progressing   Problem: Clinical Measurements: Goal: Diagnostic test results will improve Outcome: Progressing   Problem: Elimination: Goal: Will not experience complications related to urinary retention Outcome: Progressing   Problem: Pain Managment: Goal: General experience of comfort will improve and/or be controlled Outcome: Progressing   Problem: Clinical Measurements: Goal: Ability to maintain clinical measurements within normal limits will improve Outcome: Not Progressing Goal: Respiratory complications will improve Outcome: Not Progressing

## 2023-05-28 NOTE — Progress Notes (Signed)
Occupational Therapy Treatment Patient Details Name: Christopher Mason MRN: [id number] DOB: 1960/04/26 Today's Date: 05/28/2023   History of present illness Pt is a 63 y.o. male presenting to hospital 05/22/23 with c/o SOB and fatigue.  Pt admitted with acute on chronic respiratory failure with hypoxia, COPD exacerbation, LE edema.  PMH includes h/o COPD on 4 L home O2, htn, schizophrenia, h/o spinal sx, bipolar disorder, tardive dyskinesia.   OT comments  Pt is supine in bed on arrival. Easily arousable and agreeable to OT session. He denies pain. Pt performed bed mobility with SUP/CGA, extra time and effort with use of bed rails. Pt required CGA for STS x3 trials and CGA for forward/backward stepping from EOB to sink x2 trials with unilateral support. Sp02 remained 91-95% on 45L HHFNC. Pt was fatigued and wished to return to bed. Left with all needs in place and will cont to require skilled acute OT services to maximize his safety and IND to return to PLOF. He wishes to return home, but will need to progress his mobility and wean 02 needs.       If plan is discharge home, recommend the following:  A little help with walking and/or transfers;A little help with bathing/dressing/bathroom;Assistance with cooking/housework;Assist for transportation;Help with stairs or ramp for entrance   Equipment Recommendations  BSC/3in1;Tub/shower seat    Recommendations for Other Services      Precautions / Restrictions Precautions Precautions: Fall Restrictions Weight Bearing Restrictions Per Provider Order: No       Mobility Bed Mobility Overal bed mobility: Needs Assistance Bed Mobility: Supine to Sit, Sit to Supine     Supine to sit: Supervision, HOB elevated, Used rails Sit to supine: Supervision, Contact guard assist   General bed mobility comments: SUP to CGA for bed mobility today; no physical assist provided; increased effort/time and use of rails    Transfers Overall transfer level:  Needs assistance Equipment used: None Transfers: Sit to/from Stand Sit to Stand: Contact guard assist           General transfer comment: CGA x3 STS from EOB and x2 trials of forward/backward stepping to sink and back to bed with CGA; cueing for PLB; sp02 91-95% on 45L HHFNC     Balance Overall balance assessment: Needs assistance Sitting-balance support: No upper extremity supported, Feet supported Sitting balance-Leahy Scale: Good     Standing balance support: Single extremity supported Standing balance-Leahy Scale: Fair Standing balance comment: CGA with unilateral support on bipap machine in room for stepping forward/backward with no LOB                           ADL either performed or assessed with clinical judgement   ADL Overall ADL's : Needs assistance/impaired                                       General ADL Comments: declined performance today, but likely Min A for LB ADLs    Extremity/Trunk Assessment              Vision       Perception     Praxis      Cognition Arousal: Alert Behavior During Therapy: WFL for tasks assessed/performed Overall Cognitive Status: Within Functional Limits for tasks assessed  General Comments: Pleasant and participatory, talkative        Exercises Other Exercises Other Exercises: Edu on PLB, ECS, and pacing during movements.    Shoulder Instructions       General Comments 91-95% sp02 on 45L HHFNC    Pertinent Vitals/ Pain       Pain Assessment Pain Assessment: No/denies pain  Home Living                                          Prior Functioning/Environment              Frequency  Min 1X/week        Progress Toward Goals  OT Goals(current goals can now be found in the care plan section)  Progress towards OT goals: Progressing toward goals  Acute Rehab OT Goals Patient Stated Goal: return home OT  Goal Formulation: With patient Time For Goal Achievement: 06/07/23 Potential to Achieve Goals: Good  Plan      Co-evaluation                 AM-PAC OT "6 Clicks" Daily Activity     Outcome Measure   Help from another person eating meals?: None Help from another person taking care of personal grooming?: None Help from another person toileting, which includes using toliet, bedpan, or urinal?: A Little Help from another person bathing (including washing, rinsing, drying)?: A Little Help from another person to put on and taking off regular upper body clothing?: A Little Help from another person to put on and taking off regular lower body clothing?: A Little 6 Click Score: 20    End of Session Equipment Utilized During Treatment: Oxygen  OT Visit Diagnosis: Other abnormalities of gait and mobility (R26.89);Muscle weakness (generalized) (M62.81)   Activity Tolerance Patient tolerated treatment well   Patient Left in bed;with call bell/phone within reach;with bed alarm set   Nurse Communication Mobility status        Time: 1610-9604 OT Time Calculation (min): 18 min  Charges: OT Treatments $Therapeutic Activity: 8-22 mins  Chrisy Hillebrand, OTR/L  05/28/23, 12:48 PM   Daymen Hassebrock E Cambrea Kirt 05/28/2023, 12:48 PM

## 2023-05-28 NOTE — Progress Notes (Signed)
Physical Therapy Treatment Patient Details Name: Christopher Mason MRN: [id number] DOB: [birth year] Today's Date: 05/28/2023   History of Present Illness Pt is a 63 y.o. male presenting to hospital 05/22/23 with c/o SOB and fatigue.  Pt admitted with acute on chronic respiratory failure with hypoxia, COPD exacerbation, LE edema.  PMH includes h/o COPD on 4 L home O2, htn, schizophrenia, h/o spinal sx, bipolar disorder, tardive dyskinesia.    PT Comments  Pt is making gradual progress towards goals with ability to sit at EOB with good tolerance. Pt perseverates on his dog, Tiny who he wants to return home to. Pt again denies need for SNF, however reports too fatigued to attempt OOB mobility. Once seated, able to perform seated there-ex. O2 sats monitored throughout. Will need continued mobility in order to progress home.    If plan is discharge home, recommend the following: A little help with walking and/or transfers;A little help with bathing/dressing/bathroom;Assistance with cooking/housework;Assist for transportation;Help with stairs or ramp for entrance   Can travel by private vehicle        Equipment Recommendations  Rolling walker (2 wheels)    Recommendations for Other Services       Precautions / Restrictions Precautions Precautions: Fall Restrictions Weight Bearing Restrictions Per Provider Order: No     Mobility  Bed Mobility Overal bed mobility: Needs Assistance Bed Mobility: Supine to Sit, Sit to Supine     Supine to sit: Min assist Sit to supine: Supervision   General bed mobility comments: Pt pulls up on therapist to assist with transition to EOB. Upon returning back supine, pt able to perform with supervision    Transfers                   General transfer comment: deferred as pt reports still fatigued from getting up previously with OT    Ambulation/Gait                   Stairs             Wheelchair Mobility     Tilt Bed     Modified Rankin (Stroke Patients Only)       Balance Overall balance assessment: Needs assistance Sitting-balance support: No upper extremity supported, Feet supported Sitting balance-Leahy Scale: Good                                      Cognition Arousal: Alert Behavior During Therapy: WFL for tasks assessed/performed Overall Cognitive Status: Within Functional Limits for tasks assessed                                 General Comments: Pleasant and participatory, talkative        Exercises Other Exercises Other Exercises: seated ther-ex performed on B LE including LAQ and alt marching x 10 reps with supervision. O2 sats maintained 93% on HFNC.    General Comments General comments (skin integrity, edema, etc.): 91-95% sp02 on 45L HHFNC      Pertinent Vitals/Pain Pain Assessment Pain Assessment: No/denies pain    Home Living                          Prior Function            PT Goals (current goals can now  be found in the care plan section) Acute Rehab PT Goals Patient Stated Goal: to improve breathing PT Goal Formulation: With patient Time For Goal Achievement: 06/07/23 Potential to Achieve Goals: Fair Progress towards PT goals: Progressing toward goals    Frequency    Min 1X/week      PT Plan      Co-evaluation              AM-PAC PT "6 Clicks" Mobility   Outcome Measure  Help needed turning from your back to your side while in a flat bed without using bedrails?: A Little Help needed moving from lying on your back to sitting on the side of a flat bed without using bedrails?: A Lot Help needed moving to and from a bed to a chair (including a wheelchair)?: A Little Help needed standing up from a chair using your arms (e.g., wheelchair or bedside chair)?: A Little Help needed to walk in hospital room?: A Lot Help needed climbing 3-5 steps with a railing? : A Lot 6 Click Score: 15    End of Session  Equipment Utilized During Treatment: Oxygen Activity Tolerance: Patient tolerated treatment well Patient left: in bed;with call bell/phone within reach;with bed alarm set Nurse Communication: Mobility status;Precautions PT Visit Diagnosis: Unsteadiness on feet (R26.81);Other abnormalities of gait and mobility (R26.89);Muscle weakness (generalized) (M62.81);History of falling (Z91.81)     Time: 1610-9604 PT Time Calculation (min) (ACUTE ONLY): 14 min  Charges:    $Therapeutic Activity: 8-22 mins PT General Charges $$ ACUTE PT VISIT: 1 Visit                     Elizabeth Palau, PT, DPT, GCS [phone]    [name] 05/28/2023, 2:29 PM

## 2023-05-29 DIAGNOSIS — J9621 Acute and chronic respiratory failure with hypoxia: Secondary | ICD-10-CM | POA: Diagnosis not present

## 2023-05-29 DIAGNOSIS — J9622 Acute and chronic respiratory failure with hypercapnia: Secondary | ICD-10-CM | POA: Diagnosis not present

## 2023-05-29 DIAGNOSIS — J441 Chronic obstructive pulmonary disease with (acute) exacerbation: Secondary | ICD-10-CM | POA: Diagnosis not present

## 2023-05-29 LAB — BASIC METABOLIC PANEL
Anion gap: 10 (ref 5–15)
BUN: 25 mg/dL — ABNORMAL HIGH (ref 8–23)
CO2: 32 mmol/L (ref 22–32)
Calcium: 8.9 mg/dL (ref 8.9–10.3)
Chloride: 94 mmol/L — ABNORMAL LOW (ref 98–111)
Creatinine, Ser: 0.79 mg/dL (ref 0.61–1.24)
GFR, Estimated: >60 mL/min (ref >60)
Glucose, Bld: 98 mg/dL (ref 70–99)
Potassium: 3.8 mmol/L (ref 3.5–5.1)
Sodium: 136 mmol/L (ref 135–145)

## 2023-05-29 LAB — GLUCOSE, CAPILLARY
Glucose-Capillary: 86 mg/dL (ref 70–99)
Glucose-Capillary: 174 mg/dL — ABNORMAL HIGH (ref 70–99)
Glucose-Capillary: 173 mg/dL — ABNORMAL HIGH (ref 70–99)
Glucose-Capillary: 106 mg/dL — ABNORMAL HIGH (ref 70–99)

## 2023-05-29 LAB — CBC
HCT: 36.2 % — ABNORMAL LOW (ref 39.0–52.0)
Hemoglobin: 11.4 g/dL — ABNORMAL LOW (ref 13.0–17.0)
MCH: 30.3 pg (ref 26.0–34.0)
MCHC: 31.5 g/dL (ref 30.0–36.0)
MCV: 96.3 fL (ref 80.0–100.0)
Platelets: 199 10*3/uL (ref 150–400)
RBC: 3.76 MIL/uL — ABNORMAL LOW (ref 4.22–5.81)
RDW: 14 % (ref 11.5–15.5)
WBC: 9.8 10*3/uL (ref 4.0–10.5)
nRBC: 0 % (ref 0.0–0.2)

## 2023-05-29 LAB — MAGNESIUM: Magnesium: 2.5 mg/dL — ABNORMAL HIGH (ref 1.7–2.4)

## 2023-05-29 LAB — PHOSPHORUS: Phosphorus: 3.7 mg/dL (ref 2.5–4.6)

## 2023-05-29 MED ORDER — FUROSEMIDE 10 MG/ML IJ SOLN
40.0000 mg | Freq: Every day | INTRAMUSCULAR | Status: DC
  Administered 2023-05-30 – 2023-05-31 (×2): 40 mg via INTRAVENOUS
  Filled 2023-05-29 (×2): qty 4

## 2023-05-29 MED ORDER — POTASSIUM CHLORIDE CRYS ER 20 MEQ PO TBCR
40.0000 meq | EXTENDED_RELEASE_TABLET | Freq: Once | ORAL | Status: AC
  Administered 2023-05-29: 40 meq via ORAL
  Filled 2023-05-29: qty 2
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
## 2023-05-29 NOTE — TOC Progression Note (Addendum)
 Transition of Care [name]) - Progression Note    Patient Details  Name: Nazire Fruth MRN: [id number] Date of Birth: 21-Aug-1960  Transition of Care [facility]) CM/SW Contact  [name], RN Phone Number: 05/29/2023, 11:19 AM  Clinical Narrative:    Per MD patient needs NIV. Request for NIV sent to Mitch from [employer].   4:22pm Signed NIV order form sent back to Mitch at [employer].     Expected Discharge Plan: Home w Home Health Services Barriers to Discharge: Continued Medical Work up  Expected Discharge Plan and Services       Living arrangements for the past 2 months: Single Family Home                                       Social Determinants of Health (SDOH) Interventions SDOH Screenings   Food Insecurity: No Food Insecurity (05/25/2023)  Housing: Low Risk  (05/25/2023)  Transportation Needs: No Transportation Needs (05/25/2023)  Utilities: Not At Risk (05/25/2023)  Alcohol Screen: Low Risk  (09/06/2021)  Depression (PHQ2-9): Low Risk  (09/06/2021)  Tobacco Use: Medium Risk (05/25/2023)    Readmission Risk Interventions     No data to display

## 2023-05-29 NOTE — Plan of Care (Signed)

## 2023-05-29 NOTE — Progress Notes (Signed)
 Occupational Therapy Treatment Patient Details Name: Christopher Mason MRN: [id number] DOB: 17-Apr-1961 Today's Date: 05/29/2023   History of present illness Pt is a 63 y.o. male presenting to hospital 05/22/23 with c/o SOB and fatigue.  Pt admitted with acute on chronic respiratory failure with hypoxia, COPD exacerbation, LE edema.  PMH includes h/o COPD on 4 L home O2, htn, schizophrenia, h/o spinal sx, bipolar disorder, tardive dyskinesia.   OT comments  Pt is supine in bed on arrival. Reports he is very tired, but with encouragement is agreeable to OT session. He denies pain. Pt performed bed mobility with SUP. He required set up assist for UB bathing at EOB. Able to stand at bedside with unilateral support on sink with CGA to bathe his peri-area with no LOB. Intermittent rest breaks throughout to monitor sp02 ranging from 87-94% on 15L HFNC. Multiple STS trials during session with CGA/SBA. Min A for LB dressing to don L sock, able to doff both and don R sock. Reports he has an aide 3hrs/day that assists him with bathing at home. Pt able to perform standing marches for 8 reps x2 sets standing at countertop in room with no LOB. He declined sitting in recliner d/t it hurting his back, but is agreeable to eat his meal seated at EOB. Pt returned to bed with all needs in place and will cont to require skilled acute OT services to maximize his safety and IND to return to PLOF.       If plan is discharge home, recommend the following:  A little help with walking and/or transfers;A little help with bathing/dressing/bathroom;Assistance with cooking/housework;Assist for transportation;Help with stairs or ramp for entrance   Equipment Recommendations  BSC/3in1;Tub/shower seat    Recommendations for Other Services      Precautions / Restrictions Precautions Precautions: Fall Restrictions Weight Bearing Restrictions Per Provider Order: No       Mobility Bed Mobility Overal bed mobility: Needs  Assistance Bed Mobility: Supine to Sit, Sit to Supine     Supine to sit: Supervision Sit to supine: Supervision   General bed mobility comments: increased effort    Transfers Overall transfer level: Needs assistance Equipment used: None Transfers: Sit to/from Stand Sit to Stand: Contact guard assist           General transfer comment: multiple STS trials from EOB during session with CGA; able to stand to perform LB bathing to peri-area and standing marches     Balance Overall balance assessment: Needs assistance Sitting-balance support: No upper extremity supported, Feet supported Sitting balance-Leahy Scale: Good Sitting balance - Comments: performs ADLs seated with no LOB   Standing balance support: Single extremity supported Standing balance-Leahy Scale: Fair Standing balance comment: CGA/SBA for balance with unilateral support on sink during bathing                           ADL either performed or assessed with clinical judgement   ADL Overall ADL's : Needs assistance/impaired     Grooming: Wash/dry face;Set up;Sitting   Upper Body Bathing: Set up;Sitting   Lower Body Bathing: Contact guard assist;Supervison/ safety;Sit to/from stand;Sitting/lateral leans   Upper Body Dressing : Minimal assistance;Sitting Upper Body Dressing Details (indicate cue type and reason): to don gown managing tele Lower Body Dressing: Moderate assistance;Sitting/lateral leans Lower Body Dressing Details (indicate cue type and reason): able to doff bil socks; able to don R sock, but unable to reach his L foot  Extremity/Trunk Assessment              Vision       Perception     Praxis      Cognition Arousal: Alert Behavior During Therapy: WFL for tasks assessed/performed Overall Cognitive Status: Within Functional Limits for tasks assessed                                 General Comments: pleasant and agreeable to  session        Exercises      Shoulder Instructions       General Comments 15L HFNC 87-94%    Pertinent Vitals/ Pain       Pain Assessment Pain Assessment: No/denies pain  Home Living                                          Prior Functioning/Environment              Frequency  Min 1X/week        Progress Toward Goals  OT Goals(current goals can now be found in the care plan section)  Progress towards OT goals: Progressing toward goals  Acute Rehab OT Goals Patient Stated Goal: return home OT Goal Formulation: With patient Time For Goal Achievement: 06/07/23 Potential to Achieve Goals: Good  Plan      Co-evaluation                 AM-PAC OT 6 Clicks Daily Activity     Outcome Measure   Help from another person eating meals?: None Help from another person taking care of personal grooming?: None Help from another person toileting, which includes using toliet, bedpan, or urinal?: A Little Help from another person bathing (including washing, rinsing, drying)?: A Little Help from another person to put on and taking off regular upper body clothing?: None Help from another person to put on and taking off regular lower body clothing?: A Little 6 Click Score: 21    End of Session Equipment Utilized During Treatment: Oxygen  OT Visit Diagnosis: Other abnormalities of gait and mobility (R26.89);Muscle weakness (generalized) (M62.81)   Activity Tolerance Patient tolerated treatment well   Patient Left in bed;with call bell/phone within reach;with bed alarm set   Nurse Communication Mobility status        Time: 8967-8941 OT Time Calculation (min): 26 min  Charges: OT General Charges $OT Visit: 1 Visit OT Treatments $Self Care/Home Management : 23-37 mins  Christopher Mason, OTR/L  05/29/23, 12:16 PM   Christopher Mason 05/29/2023, 12:13 PM

## 2023-05-29 NOTE — Progress Notes (Addendum)
 [facility] Progress Note  Patient: Christopher Mason    FMW:[id number]  DOA: 05/22/2023     Date of Service: the patient was seen and examined on 05/29/2023  Chief Complaint  Patient presents with   Shortness of Breath   Brief hospital course: Christopher Mason is a 63 y.o. male with medical history significant of COPD, chronic hypoxic respiratory failure on 4L, schizophrenia, previous spinal surgery, hypothyroidism, hypothyroidism, bipolar disorder, tardive dyskinesia who presents to the ED with c/o SOB.    Christopher Mason states that he has been experiencing worsening shortness of breath, productive cough and congestion for the last several days.  He denies any fever, chills, nausea, vomiting, abdominal pain.  He endorses lower extremity swelling and orthopnea, but states both are chronic.  He denies any chest pain, palpitations.   ED Course:  On arrival to the ED, patient was hypertensive at 136/118 with heart rate of 79.  He was saturating at 92% on 6 L, but was subsequently placed on BiPAP due to increased work of breathing.  He was afebrile at 97.9.  Initial workup notable for hemoglobin of 12.5, potassium 2.9, glucose 152, creatinine 0.83 with GFR above 60.  BNP within normal limits at 90.9.  Troponin negative x 2.  Procalcitonin negative.  COVID-19, influenza and RSV PCR negative.  Chest x-ray with low lung volumes, however bilateral interstitial opacities concerning for edema versus atelectasis.  CT head was obtained with no acute cranial abnormality.  Patient started on azithromycin , ceftriaxone , DuoNebs, and Solu-Medrol .  [facility] contacted for admission.   Assessment and Plan:  # Acute on chronic hypoxic respiratory failure with hypercapnia Patient is on 2 to 3 L oxygen at home COPD exacerbation patient could be due to viral infection Chest x-ray has low lung volumes, but is otherwise concerning for possible vascular congestion.   TTE normal in May 2024 and normal BNP, which may be  due to morbid obesity. S/p BiPAP due to work of breathing, continue as needed Continue supplemental O2 admission and gradually wean down to baseline 2 to 3 L S/p one-time dose of azithromycin  and ceftriaxone .  WBC count within normal range, procalcitonin negative, less likely infection, so held antibiotics but restarted azithromycin  as per pulm. RVP panel negative, negative COVID flu and RSV 05/26/2023 TTE LVEF 55 to 60%, mild LV hypertrophy.  No any other significant findings and no significant change from prior study. 2/4 decreased Lasix  40 mg IV daily  # COPD exacerbation  Severe COPD exacerbation, potentially due to viral infection. - S/p Solu-Medrol  125 mg once, s/p p.o. present on 1/31 due to worsening of shortness of breath, started Solu-Medrol  80 mg IV daily 2/3 start Solu-Medrol  tapering and transition to prednisone  tapering - RVP negative - DuoNebs every 6 hours - Started Breo Ellipta  inhaler Mucinex  600 mg p.o. twice daily, Tussionex as needed D-dimer 0.71, slightly elevated, venous duplex negative for DVT CTA chest: Negative for acute PE or thoracic aortic dissection. 2. Bilateral upper lobe and bibasilar infiltrates, left greater than right. 3. Trace left pleural effusion. 1/31 respiratory failure got worse, pulmonary consulted 2/1 still in significant respiratory failure, patient was placed on BiPAP 2/2 ABG 7.37/70/60/12 0.1/40/94.5%, hypercarbic respiratory failure with compensation, patient did not wear BiPAP last night. Continue BiPAP,  2/3 started Brovana  nebulizer twice daily, and continued Pulmicort  nebulizer twice daily 2/4 As per pulmonologist patient will need NIPPV for home, notified TOC for arrangement  For Home NIPPV Patients condition quickly deteriorates without ventilator. Removal of the  ventilator may cause serious harm to the patient, exacerbation of condition and hospital readmission. Bilevel/RAD has been tried and failed to maintain or stabilize the patient.  Bilevel cannot meet current volume requirements. Patient requires frequent durations of ventilatory support. Intermittent usage is insufficient.   # Hypokalemia, potassium repleted. # Hypophosphatemia, nutritional deficiency, Phos repleted. Monitor electrolytes and replete as needed.  # HTN (hypertension) BP improving, it was low Resumed amlodipine  5 mg p.o. daily Monitor BP and titrate medications accordingly    # Lower extremity edema, resolved on 2/4 +2 pitting edema bilaterally, in the absence of a diagnosis of heart failure.  May be due to uncontrolled hypothyroidism. Venous duplex negative for DVT - TSH 16 elevated and free T4 level 1.0 wnl 1/30 started Lasix  40 mg IV twice daily 2/4 decreased Lasix  40 mg IV daily  # Hypothyroidism Last TSH and free T4 in November 2024 demonstrating uncontrolled hypothyroidism. - Resume home Synthroid  150 mcg pod - TSH 16 elevated and free T4 level 1.0 wnl Recommend to follow-up with PCP and endocrinologist as an outpatient to titrate the dose of Synthroid  accordingly.  # Schizophrenia - Resume home regimen   # Tardive dyskinesia - Resume home regimen  # Vitamin B12 level 250, lower end, goal >400: Started vitamin B12 1000 mcg IM injection daily during hospital stay, followed by oral supplement.  Follow-up PCP to repeat vitamin B12 level after 3 to 6 months. # Vitamin D  level 26.8 insufficiency: started vitamin D  50,000 units p.o. weekly, follow with PCP to repeat vitamin D  level after 3 to 6 months.  # Constipation, started laxatives, constipation resolved, patient is moving bowels.   Body mass index is 36.26 kg/m.  Interventions:  Diet: Heart healthy/carb modified diet DVT Prophylaxis: Subcutaneous Lovenox    Advance goals of care discussion: Full code  Family Communication: family was not present at bedside, at the time of interview.  The pt provided permission to discuss medical plan with the family. Opportunity was given to  ask question and all questions were answered satisfactorily.   Disposition:  Pt is from Home, admitted with SOB, still has Resp failure, which precludes a safe discharge. Discharge to Home, when stable.  Subjective: No significant events overnight, breathing is gradually improving, patient is on 15 L oxygen via nasal cannula, lower extremity edema resolved. Patient was resting calmly, denied any chest pain or palpitations, no any other active issues   Physical Exam: General: Mild respiratory distress, NAD affect appropriate  Eyes: PERRLA ENT: Clear and moist Neck: no JVD,  Cardiovascular: S1 and S2 Present, no Murmur,  Respiratory: Equal air entry bilaterally, B/L crackles and mild wheezes Abdomen: Bowel Sound present, Soft and no tenderness,  Skin: no rashes Extremities: No Pedal edema, resolved, no calf tenderness Neurologic: without any new focal findings Gait not checked due to patient safety concerns  Vitals:   05/29/23 0847 05/29/23 1128 05/29/23 1527 05/29/23 1559  BP:  138/89 (!) 116/53 137/85  Pulse:  70 60 71  Resp:  18  19  Temp:  97.8 F (36.6 C) 98.3 F (36.8 C) 98.3 F (36.8 C)  TempSrc:      SpO2: 92% 97% 96% 98%  Weight:      Height:        Intake/Output Summary (Last 24 hours) at 05/29/2023 1602 Last data filed at 05/29/2023 1400 Gross per 24 hour  Intake --  Output 2175 ml  Net -2175 ml    Filed Weights   05/22/23 1241  Weight:  105 kg    Data Reviewed: I have personally reviewed and interpreted daily labs, tele strips, imagings as discussed above. I reviewed all nursing notes, pharmacy notes, vitals, pertinent old records I have discussed plan of care as described above with RN and patient/family.  CBC: Recent Labs  Lab 05/23/23 0539 05/24/23 0517 05/27/23 0503 05/28/23 0250 05/29/23 0633  WBC 8.2 8.3 9.9 8.8 9.8  NEUTROABS 7.4  --   --   --   --   HGB 11.5* 11.6* 11.3* 10.9* 11.4*  HCT 37.7* 36.7* 36.3* 35.7* 36.2*  MCV 100.8* 98.7  98.9 100.0 96.3  PLT 230 190 210 219 199   Basic Metabolic Panel: Recent Labs  Lab 05/25/23 0459 05/26/23 0404 05/27/23 0503 05/27/23 0504 05/28/23 0250 05/29/23 0633  NA 141 140  --  139 141 136  K 4.2 4.3  --  4.0 4.4 3.8  CL 98 96*  --  96* 95* 94*  CO2 36* 35*  --  34* 36* 32  GLUCOSE 103* 122*  --  117* 116* 98  BUN 15 18  --  21 28* 25*  CREATININE 0.72 0.79  --  0.90 0.95 0.79  CALCIUM  8.6* 9.2  --  9.6 9.5 8.9  MG 2.3 2.3 2.6*  --  2.5* 2.5*  PHOS 4.0 3.2 3.6 3.5 4.1 3.7    Studies: No results found.    Scheduled Meds:  amitriptyline   25 mg Oral QHS   amLODipine   5 mg Oral Daily   arformoterol   15 mcg Nebulization BID   atorvastatin   40 mg Oral Daily   bisacodyl   10 mg Oral QHS   budesonide  (PULMICORT ) nebulizer solution  0.5 mg Nebulization BID   [START ON 05/30/2023] vitamin B-12  1,000 mcg Oral Daily   enoxaparin  (LOVENOX ) injection  50 mg Subcutaneous Q24H   furosemide   40 mg Intravenous BID   guaiFENesin   600 mg Oral BID   insulin  aspart  0-15 Units Subcutaneous TID WC   ipratropium-albuterol   3 mL Nebulization TID   levothyroxine   150 mcg Oral Q0600   methylPREDNISolone  (SOLU-MEDROL ) injection  40 mg Intravenous Daily   Followed by   NOREEN ON 05/31/2023] predniSONE   30 mg Oral Q breakfast   Followed by   NOREEN ON 06/03/2023] predniSONE   20 mg Oral Q breakfast   Followed by   NOREEN ON 06/06/2023] predniSONE   10 mg Oral Q breakfast   paliperidone   3 mg Oral QHS   pantoprazole   40 mg Oral Daily   polyethylene glycol  17 g Oral BID   sodium chloride  flush  3 mL Intravenous Q12H   thiamine   100 mg Oral Daily   traZODone   100 mg Oral QHS   venlafaxine   100 mg Oral BID   Vitamin D  (Ergocalciferol )  50,000 Units Oral Q7 days   Continuous Infusions:    PRN Meds: acetaminophen , bisacodyl , chlorpheniramine-HYDROcodone   Time spent: 40 minutes  Author: ELVAN SOR. MD [facility] 05/29/2023 4:02 PM  To reach On-call, see care teams to locate  the attending and reach out to them via [email or web address]. If 7PM-7AM, please contact night-coverage If you still have difficulty reaching the attending provider, please page the [facility] (Director on Call) for [facility] on amion for assistance.

## 2023-05-29 NOTE — Progress Notes (Signed)
 Physical Therapy Treatment Patient Details Name: Christopher Mason MRN: [id number] DOB: [birth year] Today's Date: 05/29/2023   History of Present Illness Pt is a 63 y.o. male presenting to hospital 05/22/23 with c/o SOB and fatigue.  Pt admitted with acute on chronic respiratory failure with hypoxia, COPD exacerbation, LE edema.  PMH includes h/o COPD on 4 L home O2, htn, schizophrenia, h/o spinal sx, bipolar disorder, tardive dyskinesia.    PT Comments  Pt is making gradual progress towards goals with ability to get OOB with improved independence. Once transferred to chair, pt only tolerates for a few minutes prior to requesting to return back to bed. PT encouraged staying in chair and propped with pillows, however pt still requesting to return to bed. Encouraged further mobility later today with staff to improve tolerance. O2 at 15L with sats at 93% in bed improving to 98% seated in chair. Will continue to progress.    If plan is discharge home, recommend the following: A little help with walking and/or transfers;A little help with bathing/dressing/bathroom;Assistance with cooking/housework;Assist for transportation;Help with stairs or ramp for entrance   Can travel by private vehicle     Yes  Equipment Recommendations  Rolling walker (2 wheels)    Recommendations for Other Services       Precautions / Restrictions Precautions Precautions: Fall Restrictions Weight Bearing Restrictions Per Provider Order: No     Mobility  Bed Mobility Overal bed mobility: Needs Assistance Bed Mobility: Supine to Sit, Sit to Supine     Supine to sit: Supervision Sit to supine: Supervision   General bed mobility comments: improved technique with decreased physical assist required. Once seated at EOB, upright posture noted    Transfers Overall transfer level: Needs assistance Equipment used: None Transfers: Sit to/from Stand, Bed to chair/wheelchair/BSC Sit to Stand: Contact guard assist    Step pivot transfers: Contact guard assist       General transfer comment: reaches out for arm rest. Safe technique with steps over to recliner. Only tolerates 5ish minutes before requesting to return back to bed. O2 sats at 98% on 15L of O2 post exertion    Ambulation/Gait               General Gait Details: unable   Stairs             Wheelchair Mobility     Tilt Bed    Modified Rankin (Stroke Patients Only)       Balance Overall balance assessment: Needs assistance Sitting-balance support: No upper extremity supported, Feet supported Sitting balance-Leahy Scale: Good     Standing balance support: Single extremity supported Standing balance-Leahy Scale: Fair                              Cognition Arousal: Alert Behavior During Therapy: WFL for tasks assessed/performed Overall Cognitive Status: Within Functional Limits for tasks assessed                                 General Comments: pleasant and agreeable to session        Exercises Other Exercises Other Exercises: Seated ther-ex performed on B LE including alt hip marching and LAQ x 10 reps. No SOB symptoms noted and vitals stable    General Comments        Pertinent Vitals/Pain Pain Assessment Pain Assessment: No/denies pain  Home Living                          Prior Function            PT Goals (current goals can now be found in the care plan section) Acute Rehab PT Goals Patient Stated Goal: to improve breathing PT Goal Formulation: With patient Time For Goal Achievement: 06/07/23 Potential to Achieve Goals: Fair Progress towards PT goals: Progressing toward goals    Frequency    Min 1X/week      PT Plan      Co-evaluation              AM-PAC PT 6 Clicks Mobility   Outcome Measure  Help needed turning from your back to your side while in a flat bed without using bedrails?: A Little Help needed moving from lying  on your back to sitting on the side of a flat bed without using bedrails?: A Little Help needed moving to and from a bed to a chair (including a wheelchair)?: A Little Help needed standing up from a chair using your arms (e.g., wheelchair or bedside chair)?: A Little Help needed to walk in hospital room?: A Lot Help needed climbing 3-5 steps with a railing? : A Lot 6 Click Score: 16    End of Session Equipment Utilized During Treatment: Oxygen Activity Tolerance: Patient tolerated treatment well Patient left: in bed;with bed alarm set Nurse Communication: Mobility status;Precautions PT Visit Diagnosis: Unsteadiness on feet (R26.81);Other abnormalities of gait and mobility (R26.89);Muscle weakness (generalized) (M62.81);History of falling (Z91.81)     Time: 9080-9065 PT Time Calculation (min) (ACUTE ONLY): 15 min  Charges:    $Therapeutic Exercise: 8-22 mins PT General Charges $$ ACUTE PT VISIT: 1 Visit                     Christopher Mason, PT, DPT, GCS [phone]    Christopher Mason 05/29/2023, 10:24 AM

## 2023-05-29 NOTE — Progress Notes (Signed)
 PHARMACY CONSULT NOTE - ELECTROLYTES  Pharmacy Consult for Electrolyte Monitoring and Replacement   Recent Labs: Height: 5' 7 (170.2 cm) Weight: 105 kg (231 lb 7.7 oz) IBW/kg (Calculated) : 66.1 Estimated Creatinine Clearance: 109.2 mL/min (by C-G formula based on SCr of 0.79 mg/dL).  Potassium (mmol/L)  Date Value  05/29/2023 3.8   Magnesium  (mg/dL)  Date Value  97/95/7974 2.5 (H)   Calcium  (mg/dL)  Date Value  97/95/7974 8.9   Albumin  (g/dL)  Date Value  97/97/7974 3.4 (L)  03/19/2023 4.3   Phosphorus (mg/dL)  Date Value  97/95/7974 3.7   Sodium (mmol/L)  Date Value  05/29/2023 136  03/19/2023 141    Assessment  Christopher Mason is a 63 y.o. male presenting with SOB. PMH significant for COPD chronically on 2 L, HTN, GERD, and hypothyroidism. Pharmacy has been consulted to monitor and replace electrolytes.  Diet: heart healthy MIVF: N/A Pertinent medications: lasix  40mg  IV q12h  Goal of Therapy: Electrolytes WNL  Plan:  K 4.4 > 3.8 while on furosemide  40mg  IV BID Will order kcl 40meq x1 today to keep close to 4 while on loop diuretic. Follow AM labs   Thank you for allowing pharmacy to be a part of this patient's care.  Judieth Mckown Rodriguez-Guzman PharmD, BCPS 05/29/2023 8:46 AM

## 2023-05-30 ENCOUNTER — Telehealth (HOSPITAL_COMMUNITY): Payer: Self-pay | Admitting: Pharmacy Technician

## 2023-05-30 ENCOUNTER — Other Ambulatory Visit (HOSPITAL_COMMUNITY): Payer: Self-pay

## 2023-05-30 DIAGNOSIS — J9621 Acute and chronic respiratory failure with hypoxia: Secondary | ICD-10-CM | POA: Diagnosis not present

## 2023-05-30 LAB — GLUCOSE, CAPILLARY
Glucose-Capillary: 85 mg/dL (ref 70–99)
Glucose-Capillary: 181 mg/dL — ABNORMAL HIGH (ref 70–99)
Glucose-Capillary: 167 mg/dL — ABNORMAL HIGH (ref 70–99)
Glucose-Capillary: 138 mg/dL — ABNORMAL HIGH (ref 70–99)

## 2023-05-30 LAB — CBC
HCT: 37 % — ABNORMAL LOW (ref 39.0–52.0)
Hemoglobin: 11.6 g/dL — ABNORMAL LOW (ref 13.0–17.0)
MCH: 30.4 pg (ref 26.0–34.0)
MCHC: 31.4 g/dL (ref 30.0–36.0)
MCV: 96.9 fL (ref 80.0–100.0)
Platelets: 217 10*3/uL (ref 150–400)
RBC: 3.82 MIL/uL — ABNORMAL LOW (ref 4.22–5.81)
RDW: 13.8 % (ref 11.5–15.5)
WBC: 9.1 10*3/uL (ref 4.0–10.5)
nRBC: 0 % (ref 0.0–0.2)

## 2023-05-30 LAB — BASIC METABOLIC PANEL
Anion gap: 7 (ref 5–15)
BUN: 23 mg/dL (ref 8–23)
CO2: 32 mmol/L (ref 22–32)
Calcium: 9.2 mg/dL (ref 8.9–10.3)
Chloride: 97 mmol/L — ABNORMAL LOW (ref 98–111)
Creatinine, Ser: 0.82 mg/dL (ref 0.61–1.24)
GFR, Estimated: >60 mL/min (ref >60)
Glucose, Bld: 95 mg/dL (ref 70–99)
Potassium: 4.3 mmol/L (ref 3.5–5.1)
Sodium: 136 mmol/L (ref 135–145)

## 2023-05-30 NOTE — TOC Progression Note (Addendum)
 Transition of Care [facility]) - Progression Note    Patient Details  Name: Christopher Mason MRN: [id number] Date of Birth: 1961/03/15  Transition [facility]) CM/SW Contact  [name], RN Phone Number: 05/30/2023, 9:52 AM  Clinical Narrative:    Per Thomasina from [employer]. Patient NIV was approved and will be arranged for home.   Message sent to Artavia from [facility] to confirm patient's [facility] acceptance.   9:58am Received message back from Artavia. Referral for [facility] declined.  Referral sent to [facility] and [facility] for [facility] PT/ OT.   10:45am [facility] refused referral.   11:14am HH referral declined by [facility].        Expected Discharge Plan: Home w Home Health Services Barriers to Discharge: Continued Medical Work up  Expected Discharge Plan and Services       Living arrangements for the past 2 months: Single Family Home                                       Social Determinants of Health (SDOH) Interventions SDOH Screenings   Food Insecurity: No Food Insecurity (05/25/2023)  Housing: Low Risk  (05/25/2023)  Transportation Needs: No Transportation Needs (05/25/2023)  Utilities: Not At Risk (05/25/2023)  Alcohol Screen: Low Risk  (09/06/2021)  Depression (PHQ2-9): Low Risk  (09/06/2021)  Tobacco Use: Medium Risk (05/25/2023)    Readmission Risk Interventions     No data to display

## 2023-05-30 NOTE — Telephone Encounter (Signed)
 Pharmacy Patient Advocate Encounter  Received notification from [place] [employer]ICA[employer] that Prior Authorization for Yupelri  175 mcg/3 ml neb solution has been APPROVED from 05/30/2023 to 05/29/2024. Ran test claim, Copay is $4.00. This test claim was processed through [facility]- copay amounts may vary at other pharmacies due to pharmacy/plan contracts, or as the patient moves through the different stages of their insurance plan.   PA #/Case ID/Reference #: [id number]

## 2023-05-30 NOTE — Telephone Encounter (Signed)
 Pharmacy Patient Advocate Encounter   Received notification  that prior authorization for Yupelri  Solution is required/requested.   Insurance verification completed.   The patient is insured through [facility] MEDICAID .   Per test claim: PA required; PA submitted to above mentioned insurance via [facility] Tracks Key/confirmation #/EOC [id number] W Status is pending

## 2023-05-30 NOTE — Plan of Care (Signed)

## 2023-05-30 NOTE — Progress Notes (Signed)
 [facility] Progress Note  Patient: Christopher Mason    FMW:[id number]  DOA: 05/22/2023     Date of Service: the patient was seen and examined on 05/30/2023  Chief Complaint  Patient presents with   Shortness of Breath   Brief hospital course: Christopher Mason is a 63 y.o. male with medical history significant of COPD, chronic hypoxic respiratory failure on 4L, schizophrenia, previous spinal surgery, hypothyroidism, hypothyroidism, bipolar disorder, tardive dyskinesia who presents to the ED with c/o SOB.    Christopher Mason states that he has been experiencing worsening shortness of breath, productive cough and congestion for the last several days.  He denies any fever, chills, nausea, vomiting, abdominal pain.  He endorses lower extremity swelling and orthopnea, but states both are chronic.  He denies any chest pain, palpitations.   ED Course:  On arrival to the ED, patient was hypertensive at 136/118 with heart rate of 79.  He was saturating at 92% on 6 L, but was subsequently placed on BiPAP due to increased work of breathing.  He was afebrile at 97.9.  Initial workup notable for hemoglobin of 12.5, potassium 2.9, glucose 152, creatinine 0.83 with GFR above 60.  BNP within normal limits at 90.9.  Troponin negative x 2.  Procalcitonin negative.  COVID-19, influenza and RSV PCR negative.  Chest x-ray with low lung volumes, however bilateral interstitial opacities concerning for edema versus atelectasis.  CT head was obtained with no acute cranial abnormality.  Patient started on azithromycin , ceftriaxone , DuoNebs, and Solu-Medrol .  TRH contacted for admission.   Assessment and Plan:  # Acute on chronic hypoxic respiratory failure with hypercapnia Patient is on 2 to 3 L oxygen at home COPD exacerbation patient could be due to viral infection Chest x-ray has low lung volumes, but is otherwise concerning for possible vascular congestion.   TTE normal in May 2024 and normal BNP, which may be  due to morbid obesity. S/p BiPAP due to work of breathing, continue as needed Continue supplemental O2 admission and gradually wean down to baseline 2 to 3 L S/p one-time dose of azithromycin  and ceftriaxone .  WBC count within normal range, procalcitonin negative, less likely infection, so held antibiotics but restarted azithromycin  as per pulm. RVP panel negative, negative COVID flu and RSV 05/26/2023 TTE LVEF 55 to 60%, mild LV hypertrophy.  No any other significant findings and no significant change from prior study. 2/4 decreased Lasix  40 mg IV daily 2/5 patient is still on 10 L oxygen via [place]  # COPD exacerbation  Severe COPD exacerbation, potentially due to viral infection. - S/p Solu-Medrol  125 mg once, s/p p.o. present on 1/31 due to worsening of shortness of breath, started Solu-Medrol  80 mg IV daily 2/3 start Solu-Medrol  tapering and transition to prednisone  tapering - RVP negative - DuoNebs every 6 hours - Started Breo Ellipta  inhaler Mucinex  600 mg p.o. twice daily, Tussionex as needed D-dimer 0.71, slightly elevated, venous duplex negative for DVT CTA chest: Negative for acute PE or thoracic aortic dissection. 2. Bilateral upper lobe and bibasilar infiltrates, left greater than right. 3. Trace left pleural effusion. 1/31 respiratory failure got worse, pulmonary consulted 2/1 still in significant respiratory failure, patient was placed on BiPAP 2/2 ABG 7.37/70/60/12 0.1/40/94.5%, hypercarbic respiratory failure with compensation, patient did not wear BiPAP last night. Continue BiPAP,  2/3 started Brovana  nebulizer twice daily, and continued Pulmicort  nebulizer twice daily 2/4 As per pulmonologist patient will need NIPPV for home, notified TOC for arrangement 2/5 NIPPV for  home use has been arranged by TOC  For Home NIPPV Patients condition quickly deteriorates without ventilator. Removal of the ventilator may cause serious harm to the patient, exacerbation of condition and  hospital readmission. Bilevel/RAD has been tried and failed to maintain or stabilize the patient. Bilevel cannot meet current volume requirements. Patient requires frequent durations of ventilatory support. Intermittent usage is insufficient.   # Hypokalemia, potassium repleted. # Hypophosphatemia, nutritional deficiency, Phos repleted. Monitor electrolytes and replete as needed.  # HTN (hypertension) BP improving, it was low Resumed amlodipine  5 mg p.o. daily Monitor BP and titrate medications accordingly    # Lower extremity edema, resolved on 2/4 +2 pitting edema bilaterally, in the absence of a diagnosis of heart failure.  May be due to uncontrolled hypothyroidism. Venous duplex negative for DVT - TSH 16 elevated and free T4 level 1.0 wnl 1/30 started Lasix  40 mg IV twice daily 2/4 decreased Lasix  40 mg IV daily  # Hypothyroidism Last TSH and free T4 in November 2024 demonstrating uncontrolled hypothyroidism. - Resume home Synthroid  150 mcg pod - TSH 16 elevated and free T4 level 1.0 wnl Recommend to follow-up with PCP and endocrinologist as an outpatient to titrate the dose of Synthroid  accordingly.  # Schizophrenia - Resume home regimen   # Tardive dyskinesia - Resume home regimen  # Vitamin B12 level 250, lower end, goal >400: Started vitamin B12 1000 mcg IM injection daily during hospital stay, followed by oral supplement.  Follow-up PCP to repeat vitamin B12 level after 3 to 6 months. # Vitamin D  level 26.8 insufficiency: started vitamin D  50,000 units p.o. weekly, follow with PCP to repeat vitamin D  level after 3 to 6 months.  # Constipation, started laxatives, constipation resolved, patient is moving bowels.   Body mass index is 36.26 kg/m.  Interventions:  Diet: Heart healthy/carb modified diet DVT Prophylaxis: Subcutaneous Lovenox    Advance goals of care discussion: Full code  Family Communication: family was not present at bedside, at the time of interview.   The pt provided permission to discuss medical plan with the family. Opportunity was given to ask question and all questions were answered satisfactorily.   Disposition:  Pt is from Home, admitted with SOB, still has Resp failure, which precludes a safe discharge. Discharge to Home, when stable. 2/5 patient is still on 10 L oxygen via [place]  Subjective: No significant events overnight, breathing is gradually improving, patient is on 10 L oxygen via nasal cannula, lower extremity edema resolved. Patient denied any chest palpitation, no any other complaints.   Physical Exam: General: Mild respiratory distress, NAD affect appropriate  Eyes: PERRLA ENT: Clear and moist Neck: no JVD,  Cardiovascular: S1 and S2 Present, no Murmur,  Respiratory: Equal air entry bilaterally, B/L crackles and mild wheezes Abdomen: Bowel Sound present, Soft and no tenderness,  Skin: no rashes Extremities: No Pedal edema, resolved, no calf tenderness Neurologic: without any new focal findings Gait not checked due to patient safety concerns  Vitals:   05/30/23 0810 05/30/23 1133 05/30/23 1224 05/30/23 1229  BP: (!) 132/94   (!) 146/95  Pulse: 61   79  Resp: 20   18  Temp: 98.2 F (36.8 C)   97.9 F (36.6 C)  TempSrc: Oral   Oral  SpO2: 94% 90% 92% 99%  Weight:      Height:        Intake/Output Summary (Last 24 hours) at 05/30/2023 1424 Last data filed at 05/30/2023 1300 Gross per 24 hour  Intake 720 ml  Output 2100 ml  Net -1380 ml    Filed Weights   05/22/23 1241  Weight: 105 kg    Data Reviewed: I have personally reviewed and interpreted daily labs, tele strips, imagings as discussed above. I reviewed all nursing notes, pharmacy notes, vitals, pertinent old records I have discussed plan of care as described above with RN and patient/family.  CBC: Recent Labs  Lab 05/24/23 0517 05/27/23 0503 05/28/23 0250 05/29/23 0633 05/30/23 0527  WBC 8.3 9.9 8.8 9.8 9.1  HGB 11.6* 11.3* 10.9*  11.4* 11.6*  HCT 36.7* 36.3* 35.7* 36.2* 37.0*  MCV 98.7 98.9 100.0 96.3 96.9  PLT 190 210 219 199 217   Basic Metabolic Panel: Recent Labs  Lab 05/25/23 0459 05/26/23 0404 05/27/23 0503 05/27/23 0504 05/28/23 0250 05/29/23 0633 05/30/23 0527  NA 141 140  --  139 141 136 136  K 4.2 4.3  --  4.0 4.4 3.8 4.3  CL 98 96*  --  96* 95* 94* 97*  CO2 36* 35*  --  34* 36* 32 32  GLUCOSE 103* 122*  --  117* 116* 98 95  BUN 15 18  --  21 28* 25* 23  CREATININE 0.72 0.79  --  0.90 0.95 0.79 0.82  CALCIUM  8.6* 9.2  --  9.6 9.5 8.9 9.2  MG 2.3 2.3 2.6*  --  2.5* 2.5*  --   PHOS 4.0 3.2 3.6 3.5 4.1 3.7  --     Studies: No results found.    Scheduled Meds:  amitriptyline   25 mg Oral QHS   amLODipine   5 mg Oral Daily   arformoterol   15 mcg Nebulization BID   atorvastatin   40 mg Oral Daily   bisacodyl   10 mg Oral QHS   budesonide  (PULMICORT ) nebulizer solution  0.5 mg Nebulization BID   vitamin B-12  1,000 mcg Oral Daily   enoxaparin  (LOVENOX ) injection  50 mg Subcutaneous Q24H   furosemide   40 mg Intravenous Daily   guaiFENesin   600 mg Oral BID   insulin  aspart  0-15 Units Subcutaneous TID WC   ipratropium-albuterol   3 mL Nebulization TID   levothyroxine   150 mcg Oral Q0600   paliperidone   3 mg Oral QHS   pantoprazole   40 mg Oral Daily   polyethylene glycol  17 g Oral BID   [START ON 05/31/2023] predniSONE   30 mg Oral Q breakfast   Followed by   NOREEN ON 06/03/2023] predniSONE   20 mg Oral Q breakfast   Followed by   NOREEN ON 06/06/2023] predniSONE   10 mg Oral Q breakfast   sodium chloride  flush  3 mL Intravenous Q12H   thiamine   100 mg Oral Daily   traZODone   100 mg Oral QHS   venlafaxine   100 mg Oral BID   Vitamin D  (Ergocalciferol )  50,000 Units Oral Q7 days   Continuous Infusions:    PRN Meds: acetaminophen , bisacodyl , chlorpheniramine-HYDROcodone   Time spent: 40 minutes  Author: ELVAN SOR. MD [facility] 05/30/2023 2:24 PM  To reach On-call, see care  teams to locate the attending and reach out to them via [email or web address]. If 7PM-7AM, please contact night-coverage If you still have difficulty reaching the attending provider, please page the [facility] (Director on Call) for [facility] on amion for assistance.

## 2023-05-30 NOTE — Progress Notes (Signed)
 Occupational Therapy Treatment Patient Details Name: Christopher Mason MRN: [id number] DOB: Jul 09, 1960 Today's Date: 05/30/2023   History of present illness Pt is a 63 y.o. male presenting to hospital 05/22/23 with c/o SOB and fatigue.  Pt admitted with acute on chronic respiratory failure with hypoxia, COPD exacerbation, LE edema.  PMH includes h/o COPD on 4 L home O2, htn, schizophrenia, h/o spinal sx, bipolar disorder, tardive dyskinesia.   OT comments  Pt is supine in bed on arrival. Easily arousable and agreeable to OT session. Max encouragement need for participation as pt reports continued fatigue. States he is like this at home sometimes. He denies pain. Pt performed bed mobility with SUP. He was on 10L HFNC on entry with stable 02 >90%. Dropped to 86% with activity and required 14L to recover and maintain 90-91% sp02. Pt stood from EOB x3 trials and took lateral steps with no AD use and CGA/SBA with no LOB. Pt declined any further activity and wished to eat his lunch. Agreeable to sit EOB to eat d/t refusing to sit in recliner d/t it being uncomfortable. He was left seated EOB with all needs in place and will cont to require skilled acute OT services to maximize his safety and IND to return to PLOF. Nurse notified of 02 needs and being left at EOB.       If plan is discharge home, recommend the following:  A little help with walking and/or transfers;A little help with bathing/dressing/bathroom;Assistance with cooking/housework;Assist for transportation;Help with stairs or ramp for entrance   Equipment Recommendations  BSC/3in1;Tub/shower seat    Recommendations for Other Services      Precautions / Restrictions Restrictions Weight Bearing Restrictions Per Provider Order: No Other Position/Activity Restrictions: sp02 86-91% during session today on 10-14L 02 HFNC       Mobility Bed Mobility Overal bed mobility: Needs Assistance Bed Mobility: Supine to Sit, Sit to Supine      Supine to sit: Supervision Sit to supine: Supervision        Transfers Overall transfer level: Needs assistance Equipment used: None Transfers: Sit to/from Stand Sit to Stand: Contact guard assist           General transfer comment: x3 standing trials during session from EOB with no AD use and CGA/SBA provided; no LOB and able to lateral side step to [facility]; pt declining further mobility or chair transfer     Balance Overall balance assessment: Needs assistance Sitting-balance support: No upper extremity supported, Feet supported Sitting balance-Leahy Scale: Good Sitting balance - Comments: left seated EOB to self feed   Standing balance support: No upper extremity supported Standing balance-Leahy Scale: Fair Standing balance comment: CGA/SBA for balance with no LOB in standing during lateral steps                           ADL either performed or assessed with clinical judgement   ADL Overall ADL's : Needs assistance/impaired                             Toileting- Clothing Manipulation and Hygiene: Supervision/safety;Contact guard assist Toileting - Clothing Manipulation Details (indicate cue type and reason): CGA/SBA to use urinal standing at EOB            Extremity/Trunk Assessment              Vision       Perception  Praxis      Cognition Arousal: Alert Behavior During Therapy: WFL for tasks assessed/performed Overall Cognitive Status: Within Functional Limits for tasks assessed                                 General Comments: increased motivation to participate        Exercises Other Exercises Other Exercises: Edu on importance of sitting EOB multiple times a day to promote OOB activity.    Shoulder Instructions       General Comments on 10L on entry with sp02 91%; increased to 14L d/t drop to 86% with activity; left seated EOB on 14L at 91%    Pertinent Vitals/ Pain       Pain Assessment Pain  Assessment: No/denies pain  Home Living                                          Prior Functioning/Environment              Frequency  Min 1X/week        Progress Toward Goals  OT Goals(current goals can now be found in the care plan section)  Progress towards OT goals: Progressing toward goals  Acute Rehab OT Goals Patient Stated Goal: return home OT Goal Formulation: With patient Time For Goal Achievement: 06/07/23 Potential to Achieve Goals: Good  Plan      Co-evaluation                 AM-PAC OT 6 Clicks Daily Activity     Outcome Measure   Help from another person eating meals?: None Help from another person taking care of personal grooming?: None Help from another person toileting, which includes using toliet, bedpan, or urinal?: A Little Help from another person bathing (including washing, rinsing, drying)?: A Little Help from another person to put on and taking off regular upper body clothing?: None Help from another person to put on and taking off regular lower body clothing?: A Little 6 Click Score: 21    End of Session Equipment Utilized During Treatment: Oxygen  OT Visit Diagnosis: Other abnormalities of gait and mobility (R26.89);Muscle weakness (generalized) (M62.81)   Activity Tolerance Patient tolerated treatment well   Patient Left in bed;with call bell/phone within reach   Nurse Communication Mobility status (leaving him seated EOB to eat lunch)        Time: 8863-8843 OT Time Calculation (min): 20 min  Charges: OT Treatments $Self Care/Home Management : 8-22 mins  Bethani Brugger, OTR/L  05/30/23, 12:30 PM   Kerrie Timm E Tesa Meadors 05/30/2023, 12:30 PM

## 2023-05-30 NOTE — Progress Notes (Signed)
 PHARMACY CONSULT NOTE - ELECTROLYTES  Pharmacy Consult for Electrolyte Monitoring and Replacement   Recent Labs: Height: 5' 7 (170.2 cm) Weight: 105 kg (231 lb 7.7 oz) IBW/kg (Calculated) : 66.1 Estimated Creatinine Clearance: 106.6 mL/min (by C-G formula based on SCr of 0.82 mg/dL).  Potassium (mmol/L)  Date Value  05/30/2023 4.3   Magnesium  (mg/dL)  Date Value  97/95/7974 2.5 (H)   Calcium  (mg/dL)  Date Value  97/94/7974 9.2   Albumin  (g/dL)  Date Value  97/97/7974 3.4 (L)  03/19/2023 4.3   Phosphorus (mg/dL)  Date Value  97/95/7974 3.7   Sodium (mmol/L)  Date Value  05/30/2023 136  03/19/2023 141    Assessment  Christopher Mason is a 63 y.o. male presenting with SOB. PMH significant for COPD chronically on 2 L, HTN, GERD, and hypothyroidism. Pharmacy has been consulted to monitor and replace electrolytes.  Diet: heart healthy MIVF: N/A Pertinent medications: lasix  40mg  IV q12h > IV 40 mg daily.   Goal of Therapy: Electrolytes WNL  Plan:  No replacement needed F/u with AM labs.   Thank you for allowing pharmacy to be a part of this patient's care.  Cathaleen Blanch, PharmD, BCPS 05/30/2023 7:33 AM

## 2023-05-31 ENCOUNTER — Ambulatory Visit: Payer: Medicaid Other | Admitting: Orthopedic Surgery

## 2023-05-31 ENCOUNTER — Ambulatory Visit: Payer: MEDICAID | Admitting: Neurosurgery

## 2023-05-31 DIAGNOSIS — J9621 Acute and chronic respiratory failure with hypoxia: Secondary | ICD-10-CM | POA: Diagnosis not present

## 2023-05-31 LAB — CBC
HCT: 36.4 % — ABNORMAL LOW (ref 39.0–52.0)
Hemoglobin: 11.5 g/dL — ABNORMAL LOW (ref 13.0–17.0)
MCH: 29.9 pg (ref 26.0–34.0)
MCHC: 31.6 g/dL (ref 30.0–36.0)
MCV: 94.8 fL (ref 80.0–100.0)
Platelets: 217 K/uL (ref 150–400)
RBC: 3.84 MIL/uL — ABNORMAL LOW (ref 4.22–5.81)
RDW: 13.8 % (ref 11.5–15.5)
WBC: 8.8 K/uL (ref 4.0–10.5)
nRBC: 0 % (ref 0.0–0.2)

## 2023-05-31 LAB — BASIC METABOLIC PANEL WITH GFR
Anion gap: 5 (ref 5–15)
BUN: 24 mg/dL — ABNORMAL HIGH (ref 8–23)
CO2: 34 mmol/L — ABNORMAL HIGH (ref 22–32)
Calcium: 8.9 mg/dL (ref 8.9–10.3)
Chloride: 99 mmol/L (ref 98–111)
Creatinine, Ser: 0.84 mg/dL (ref 0.61–1.24)
GFR, Estimated: >60 mL/min
Glucose, Bld: 96 mg/dL (ref 70–99)
Potassium: 4 mmol/L (ref 3.5–5.1)
Sodium: 138 mmol/L (ref 135–145)

## 2023-05-31 LAB — GLUCOSE, CAPILLARY
Glucose-Capillary: 83 mg/dL (ref 70–99)
Glucose-Capillary: 169 mg/dL — ABNORMAL HIGH (ref 70–99)
Glucose-Capillary: 120 mg/dL — ABNORMAL HIGH (ref 70–99)
Glucose-Capillary: 105 mg/dL — ABNORMAL HIGH (ref 70–99)

## 2023-05-31 MED ORDER — FUROSEMIDE 40 MG PO TABS
40.0000 mg | ORAL_TABLET | Freq: Two times a day (BID) | ORAL | Status: DC
  Administered 2023-06-01 (×2): 40 mg via ORAL
  Filled 2023-05-31 (×2): qty 1

## 2023-05-31 NOTE — TOC Progression Note (Signed)
 Transition of Care [facility]) - Progression Note    Patient Details  Name: Christopher Mason MRN: [id number] Date of Birth: 28-Sep-1960  Transition of Care [facility]) CM/SW Contact  [name], RN Phone Number: 05/31/2023, 3:40 PM  Clinical Narrative:    Spoke with patient at bedside regarding discharge plan. RNCM advised HH has not been secure. He stated he has care via [facility] and they would also transport him home. He was advised his NIV had been approved and would be set up in his home once he is home.   Mitch from [employer] advised of patient's likely discharge home tomorrow.   Retrieved message from primary nurse that [facility], Romero left her contact information. Attempt to reach [facility] @ [phone]. Romero was on another line. Left RNCM contact and requested return call.    Expected Discharge Plan: Home w Home Health Services Barriers to Discharge: Continued Medical Work up  Expected Discharge Plan and Services       Living arrangements for the past 2 months: Single Family Home                                       Social Determinants of Health (SDOH) Interventions SDOH Screenings   Food Insecurity: No Food Insecurity (05/25/2023)  Housing: Low Risk  (05/25/2023)  Transportation Needs: No Transportation Needs (05/25/2023)  Utilities: Not At Risk (05/25/2023)  Alcohol Screen: Low Risk  (09/06/2021)  Depression (PHQ2-9): Low Risk  (09/06/2021)  Tobacco Use: Medium Risk (05/25/2023)    Readmission Risk Interventions     No data to display

## 2023-05-31 NOTE — Progress Notes (Signed)
 PHARMACY CONSULT NOTE - ELECTROLYTES  Pharmacy Consult for Electrolyte Monitoring and Replacement   Recent Labs: Height: 5' 7 (170.2 cm) Weight: 105 kg (231 lb 7.7 oz) IBW/kg (Calculated) : 66.1 Estimated Creatinine Clearance: 104 mL/min (by C-G formula based on SCr of 0.84 mg/dL).  Potassium (mmol/L)  Date Value  05/31/2023 4.0   Magnesium  (mg/dL)  Date Value  97/95/7974 2.5 (H)   Calcium  (mg/dL)  Date Value  97/93/7974 8.9   Albumin  (g/dL)  Date Value  97/97/7974 3.4 (L)  03/19/2023 4.3   Phosphorus (mg/dL)  Date Value  97/95/7974 3.7   Sodium (mmol/L)  Date Value  05/31/2023 138  03/19/2023 141    Assessment  Christopher Mason is a 63 y.o. male presenting with SOB. PMH significant for COPD chronically on 2 L, HTN, GERD, and hypothyroidism. Pharmacy has been consulted to monitor and replace electrolytes.  Diet: heart healthy MIVF: N/A Pertinent medications: lasix  40mg  IV q12h > IV 40 mg daily.   Goal of Therapy: Electrolytes WNL  Plan:  No replacement needed F/u with AM labs.   Thank you for allowing pharmacy to be a part of this patient's care.  Cathaleen Blanch, PharmD, BCPS 05/31/2023 7:37 AM

## 2023-05-31 NOTE — Progress Notes (Signed)
 PROGRESS NOTE    Christopher Mason   FMW:[id number] DOB: 04/23/1961  DOA: 05/22/2023 Date of Service: 05/31/23 which is hospital day 9  PCP: Liana Fish, NP    Hospital course / significant events:  Christopher Mason is a 63 y.o. male with medical history significant of COPD, chronic hypoxic respiratory failure on 4L, schizophrenia, previous spinal surgery, hypothyroidism, hypothyroidism, bipolar disorder, tardive dyskinesia who presents to the ED with c/o SOB x several days.   01/28: admitted to hospitalist COPD exacerbation.   01/31 respiratory failure got worse, pulmonary consulted 02/01 still in significant respiratory failure, patient was placed on BiPAP 02/02 ABG 7.37/70/60/12 0.1/40/94.5%, hypercarbic respiratory failure with compensation, patient did not wear BiPAP last night. Continue BiPAP,  02/03 started Brovana  nebulizer twice daily, and continued Pulmicort  nebulizer twice daily 02/04 As per pulmonologist patient will need NIPPV for home, notified TOC for arrangement 02/05 NIPPV for home use has been arranged by TOC 02/06 weaning down O2 but still requiring high flow [name] during the day  Consultants:  Pulmonology   Procedures/Surgeries: none      ASSESSMENT & PLAN:   # Acute on chronic hypoxic respiratory failure with hypercapnia Patient is on 2 to 3 L oxygen at home Continue supplemental O2 admission and gradually wean down to baseline 2 to 3 L S/p BiPAP due to work of breathing, continue as needed    COPD exacerbation - see below could be due to viral infection RVP panel negative, negative COVID flu and RSV See below    Bacterial pneumonia reasonably ruled out  S/p one-time dose of azithromycin  and ceftriaxone .  WBC count within normal range, procalcitonin negative, less likely infection, so held antibiotics but restarted azithromycin  as per pulm.    Question HFpEF - see below - CHF exacerbation reasonably ruled out Chest x-ray low lung volumes,  also concerning for vascular congestion.   TTE normal in May 2024 and normal BNP, which may be due to morbid obesity. 05/26/2023 TTE LVEF 55 to 60%, mild LV hypertrophy.  No any other significant findings and no significant change from prior study. 2/4 decreased Lasix  40 mg IV daily    D-dimer 0.71, slightly elevated - Question VTE/PE but this is ruled out  venous duplex negative for DVT CTA chest 01/31: Negative for acute PE or thoracic aortic dissection. 2. Bilateral upper lobe and bibasilar infiltrates, left greater than right. 3. Trace left pleural effusion.   # COPD exacerbation  Severe COPD exacerbation, potentially due to viral infection. Maintained on DuoNebs, steroids, O2 as above  2/3 start Solu-Medrol  tapering and transition to prednisone  tapering Started Breo Ellipta  inhaler Mucinex  600 mg p.o. twice daily, Tussionex as needed Per pulmonary:  continue triple therapy via nebulizer (arformoterol , budesonide ), add revefenacin  (yupelri ) on d/c quick prednisone  taper as you are doing d/c on NIPPV and O2 therapy outpatient follow up with Dr. Tamea (scheduled 06/27/23)  For Home NIPPV Patients condition quickly deteriorates without ventilator. Removal of the ventilator may cause serious harm to the patient, exacerbation of condition and hospital readmission. Bilevel/RAD has been tried and failed to maintain or stabilize the patient. Bilevel cannot meet current volume requirements. Patient requires frequent durations of ventilatory support. Intermittent usage is insufficient.    # Hypokalemia, potassium repleted.  # Hypophosphatemia, nutritional deficiency, Phos repleted. Monitor electrolytes and replete as needed.   # HTN (hypertension) BP improving, it was low Resumed amlodipine  5 mg p.o. daily Monitor BP and titrate medications accordingly   # Lower extremity edema,  resolved on 2/4 +2 pitting edema bilaterally, in the absence of a diagnosis of heart failure.  May be due to  uncontrolled hypothyroidism. Venous duplex negative for DVT TSH 16 elevated and free T4 level 1.0 wnl 1/30 started Lasix  40 mg IV twice daily 2/4 decreased Lasix  40 mg IV daily 2/6 lasix  to po 40 mg bid    # Hypothyroidism Last TSH and free T4 in November 2024 demonstrating uncontrolled hypothyroidism. - Resume home Synthroid  150 mcg pod - TSH 16 elevated and free T4 level 1.0 wnl Recommend to follow-up with PCP and endocrinologist as an outpatient to titrate the dose of Synthroid  accordingly.   # Schizophrenia - Resume home regimen   # Tardive dyskinesia - Resume home regimen   # Vitamin B12 level 250, lower end, goal >400: Started vitamin B12 1000 mcg IM injection daily during hospital stay, followed by oral supplement.  Follow-up PCP to repeat vitamin B12 level after 3 to 6 months. # Vitamin D  level 26.8 insufficiency: started vitamin D  50,000 units p.o. weekly, follow with PCP to repeat vitamin D  level after 3 to 6 months.   # Constipation, started laxatives, constipation resolved, patient is moving bowels.    Class 2 obesity based on BMI: Body mass index is 36.26 kg/m.  Underweight - under 18  overweight - 25 to 29 obese - 30 or more Class 1 obesity: BMI of 30.0 to 34 Class 2 obesity: BMI of 35.0 to 39 Class 3 obesity: BMI of 40.0 to 49 Super Morbid Obesity: BMI 50-59 Super-super Morbid Obesity: BMI 60+ Significantly low or high BMI is associated with higher medical risk.  Weight management advised as adjunct to other disease management and risk reduction treatments    DVT prophylaxis: lovenox   IV fluids: no continuous IV fluids  Nutrition: cardiac/carb diet Central lines / invasive devices: none  Code Status: FULL CODE ACP documentation reviewed:  none on file in [facility]  TOC needs: home O2 and DME, home health Barriers to dispo / significant pending items: O2 requirement tapering down, high risk decompensation/readmission, expect wean down O2 next few days and  anticipate d/c in 2-4 days              Subjective / Brief ROS:  Patient reports breathing okay but struggling some w/ SOB on exertion Denies CP/SOB at rest  Pain controlled.  Denies new weakness.  Tolerating diet.  Reports no concerns w/ urination/defecation.   Family Communication: none at this time    Objective Findings:  Vitals:   05/31/23 0401 05/31/23 0749 05/31/23 0828 05/31/23 1255  BP: (!) 140/87 (!) 145/99  135/75  Pulse: 61 65 60 72  Resp: (!) 22 20 20 20   Temp: 98.1 F (36.7 C) 98.4 F (36.9 C)  98.4 F (36.9 C)  TempSrc: Axillary Oral  Oral  SpO2: 95% 100% 99% 93%  Weight:      Height:        Intake/Output Summary (Last 24 hours) at 05/31/2023 1418 Last data filed at 05/31/2023 1300 Gross per 24 hour  Intake 240 ml  Output 2325 ml  Net -2085 ml   Filed Weights   05/22/23 1241  Weight: 105 kg    Examination:  Physical Exam Constitutional:      General: He is not in acute distress. Cardiovascular:     Rate and Rhythm: Normal rate and regular rhythm.  Pulmonary:     Effort: Pulmonary effort is normal.     Breath sounds: Decreased breath sounds  and wheezing present.  Musculoskeletal:     Right lower leg: No edema.     Left lower leg: No edema.  Neurological:     Mental Status: He is alert.  Psychiatric:        Mood and Affect: Mood normal.        Behavior: Behavior normal.          Scheduled Medications:   amitriptyline   25 mg Oral QHS   amLODipine   5 mg Oral Daily   arformoterol   15 mcg Nebulization BID   atorvastatin   40 mg Oral Daily   bisacodyl   10 mg Oral QHS   budesonide  (PULMICORT ) nebulizer solution  0.5 mg Nebulization BID   vitamin B-12  1,000 mcg Oral Daily   enoxaparin  (LOVENOX ) injection  50 mg Subcutaneous Q24H   [START ON 06/01/2023] furosemide   40 mg Oral BID WC   guaiFENesin   600 mg Oral BID   insulin  aspart  0-15 Units Subcutaneous TID WC   ipratropium-albuterol   3 mL Nebulization TID   levothyroxine   150  mcg Oral Q0600   paliperidone   3 mg Oral QHS   pantoprazole   40 mg Oral Daily   polyethylene glycol  17 g Oral BID   predniSONE   30 mg Oral Q breakfast   Followed by   NOREEN ON 06/03/2023] predniSONE   20 mg Oral Q breakfast   Followed by   NOREEN ON 06/06/2023] predniSONE   10 mg Oral Q breakfast   sodium chloride  flush  3 mL Intravenous Q12H   thiamine   100 mg Oral Daily   traZODone   100 mg Oral QHS   venlafaxine   100 mg Oral BID   Vitamin D  (Ergocalciferol )  50,000 Units Oral Q7 days    Continuous Infusions:   PRN Medications:  acetaminophen , bisacodyl , chlorpheniramine-HYDROcodone   Antimicrobials from admission:  Anti-infectives (From admission, onward)    Start     Dose/Rate Route Frequency Ordered Stop   05/25/23 1300  azithromycin  (ZITHROMAX ) 500 mg in sodium chloride  0.9 % 250 mL IVPB        500 mg 250 mL/hr over 60 Minutes Intravenous Every 24 hours 05/25/23 1253 05/29/23 1259   05/23/23 1600  azithromycin  (ZITHROMAX ) 500 mg in sodium chloride  0.9 % 250 mL IVPB  Status:  Discontinued        500 mg 250 mL/hr over 60 Minutes Intravenous Every 24 hours 05/22/23 1923 05/22/23 1924   05/22/23 1815  cefTRIAXone  (ROCEPHIN ) 2 g in sodium chloride  0.9 % 100 mL IVPB        2 g 200 mL/hr over 30 Minutes Intravenous  Once 05/22/23 1804 05/22/23 2039   05/22/23 1815  azithromycin  (ZITHROMAX ) 500 mg in sodium chloride  0.9 % 250 mL IVPB        500 mg 250 mL/hr over 60 Minutes Intravenous  Once 05/22/23 1804 05/22/23 2100           Data Reviewed:  I have personally reviewed the following...  CBC: Recent Labs  Lab 05/27/23 0503 05/28/23 0250 05/29/23 9366 05/30/23 0527 05/31/23 0615  WBC 9.9 8.8 9.8 9.1 8.8  HGB 11.3* 10.9* 11.4* 11.6* 11.5*  HCT 36.3* 35.7* 36.2* 37.0* 36.4*  MCV 98.9 100.0 96.3 96.9 94.8  PLT 210 219 199 217 217   Basic Metabolic Panel: Recent Labs  Lab 05/25/23 0459 05/26/23 0404 05/27/23 0503 05/27/23 0504 05/28/23 0250 05/29/23 0633  05/30/23 0527 05/31/23 0615  NA 141 140  --  139 141 136 136 138  K  4.2 4.3  --  4.0 4.4 3.8 4.3 4.0  CL 98 96*  --  96* 95* 94* 97* 99  CO2 36* 35*  --  34* 36* 32 32 34*  GLUCOSE 103* 122*  --  117* 116* 98 95 96  BUN 15 18  --  21 28* 25* 23 24*  CREATININE 0.72 0.79  --  0.90 0.95 0.79 0.82 0.84  CALCIUM  8.6* 9.2  --  9.6 9.5 8.9 9.2 8.9  MG 2.3 2.3 2.6*  --  2.5* 2.5*  --   --   PHOS 4.0 3.2 3.6 3.5 4.1 3.7  --   --    GFR: Estimated Creatinine Clearance: 104 mL/min (by C-G formula based on SCr of 0.84 mg/dL). Liver Function Tests: Recent Labs  Lab 05/27/23 0504  ALBUMIN  3.4*   No results for input(s): LIPASE, AMYLASE in the last 168 hours. No results for input(s): AMMONIA in the last 168 hours. Coagulation Profile: No results for input(s): INR, PROTIME in the last 168 hours. Cardiac Enzymes: No results for input(s): CKTOTAL, CKMB, CKMBINDEX, TROPONINI in the last 168 hours. BNP (last 3 results) No results for input(s): PROBNP in the last 8760 hours. HbA1C: No results for input(s): HGBA1C in the last 72 hours. CBG: Recent Labs  Lab 05/30/23 1231 05/30/23 1708 05/30/23 2130 05/31/23 0833 05/31/23 1134  GLUCAP 181* 167* 138* 83 169*   Lipid Profile: No results for input(s): CHOL, HDL, LDLCALC, TRIG, CHOLHDL, LDLDIRECT in the last 72 hours. Thyroid  Function Tests: No results for input(s): TSH, T4TOTAL, FREET4, T3FREE, THYROIDAB in the last 72 hours. Anemia Panel: No results for input(s): VITAMINB12, FOLATE, FERRITIN, TIBC, IRON , RETICCTPCT in the last 72 hours. Most Recent Urinalysis On File:     Component Value Date/Time   COLORURINE YELLOW (A) 08/16/2022 2015   APPEARANCEUR CLEAR (A) 08/16/2022 2015   APPEARANCEUR Clear 03/02/2020 0916   LABSPEC 1.019 08/16/2022 2015   PHURINE 5.0 08/16/2022 2015   GLUCOSEU NEGATIVE 08/16/2022 2015   HGBUR SMALL (A) 08/16/2022 2015   BILIRUBINUR NEGATIVE 08/16/2022  2015   BILIRUBINUR Negative 03/02/2020 0916   KETONESUR NEGATIVE 08/16/2022 2015   PROTEINUR NEGATIVE 08/16/2022 2015   NITRITE NEGATIVE 08/16/2022 2015   LEUKOCYTESUR NEGATIVE 08/16/2022 2015   Sepsis Labs: @LABRCNTIP (procalcitonin:4,lacticidven:4) Microbiology: Recent Results (from the past 240 hours)  SARS Coronavirus 2 by RT PCR (hospital order, performed in [facility] hospital lab) *cepheid single result test* Anterior Nasal Swab     Status: None   Collection Time: 05/22/23 12:47 PM   Specimen: Anterior Nasal Swab  Result Value Ref Range Status   SARS Coronavirus 2 by RT PCR NEGATIVE NEGATIVE Final    Comment: (NOTE) SARS-CoV-2 target nucleic acids are NOT DETECTED.  The SARS-CoV-2 RNA is generally detectable in upper and lower respiratory specimens during the acute phase of infection. The lowest concentration of SARS-CoV-2 viral copies this assay can detect is 250 copies / mL. A negative result does not preclude SARS-CoV-2 infection and should not be used as the sole basis for treatment or other patient management decisions.  A negative result may occur with improper specimen collection / handling, submission of specimen other than nasopharyngeal swab, presence of viral mutation(s) within the areas targeted by this assay, and inadequate number of viral copies (<250 copies / mL). A negative result must be combined with clinical observations, patient history, and epidemiological information.  Fact Sheet for Patients:   roadlaptop.co.za  Fact Sheet for Healthcare Providers: http://kim-miller.com/  This test  is not yet approved or  cleared by the [place]  FDA and has been authorized for detection and/or diagnosis of SARS-CoV-2 by FDA under an Emergency Use Authorization (EUA).  This EUA will remain in effect (meaning this test can be used) for the duration of the COVID-19 declaration under Section 564(b)(1) of the Act, 21  U.S.C. section 360bbb-3(b)(1), unless the authorization is terminated or revoked sooner.  Performed at [facility], [address] [place], [place] [address]   Resp panel by RT-PCR (RSV, Flu A&B, Covid) Anterior Nasal Swab     Status: None   Collection Time: 05/22/23  4:09 PM   Specimen: Anterior Nasal Swab  Result Value Ref Range Status   SARS Coronavirus 2 by RT PCR NEGATIVE NEGATIVE Final    Comment: (NOTE) SARS-CoV-2 target nucleic acids are NOT DETECTED.  The SARS-CoV-2 RNA is generally detectable in upper respiratory specimens during the acute phase of infection. The lowest concentration of SARS-CoV-2 viral copies this assay can detect is 138 copies/mL. A negative result does not preclude SARS-Cov-2 infection and should not be used as the sole basis for treatment or other patient management decisions. A negative result may occur with  improper specimen collection/handling, submission of specimen other than nasopharyngeal swab, presence of viral mutation(s) within the areas targeted by this assay, and inadequate number of viral copies(<138 copies/mL). A negative result must be combined with clinical observations, patient history, and epidemiological information. The expected result is Negative.  Fact Sheet for Patients:  bloggercourse.com  Fact Sheet for Healthcare Providers:  seriousbroker.it  This test is no t yet approved or cleared by the [place]  FDA and  has been authorized for detection and/or diagnosis of SARS-CoV-2 by FDA under an Emergency Use Authorization (EUA). This EUA will remain  in effect (meaning this test can be used) for the duration of the COVID-19 declaration under Section 564(b)(1) of the Act, 21 U.S.C.section 360bbb-3(b)(1), unless the authorization is terminated  or revoked sooner.       Influenza A by PCR NEGATIVE NEGATIVE Final   Influenza B by PCR NEGATIVE NEGATIVE Final     Comment: (NOTE) The Xpert Xpress SARS-CoV-2/FLU/RSV plus assay is intended as an aid in the diagnosis of influenza from Nasopharyngeal swab specimens and should not be used as a sole basis for treatment. Nasal washings and aspirates are unacceptable for Xpert Xpress SARS-CoV-2/FLU/RSV testing.  Fact Sheet for Patients: bloggercourse.com  Fact Sheet for Healthcare Providers: seriousbroker.it  This test is not yet approved or cleared by the [place]  FDA and has been authorized for detection and/or diagnosis of SARS-CoV-2 by FDA under an Emergency Use Authorization (EUA). This EUA will remain in effect (meaning this test can be used) for the duration of the COVID-19 declaration under Section 564(b)(1) of the Act, 21 U.S.C. section 360bbb-3(b)(1), unless the authorization is terminated or revoked.     Resp Syncytial Virus by PCR NEGATIVE NEGATIVE Final    Comment: (NOTE) Fact Sheet for Patients: bloggercourse.com  Fact Sheet for Healthcare Providers: seriousbroker.it  This test is not yet approved or cleared by the [place]  FDA and has been authorized for detection and/or diagnosis of SARS-CoV-2 by FDA under an Emergency Use Authorization (EUA). This EUA will remain in effect (meaning this test can be used) for the duration of the COVID-19 declaration under Section 564(b)(1) of the Act, 21 U.S.C. section 360bbb-3(b)(1), unless the authorization is terminated or revoked.  Performed at [facility], [address] [place], [place]  [address]   Respiratory (~20 pathogens) panel by PCR     Status: None   Collection Time: 05/22/23  7:42 PM   Specimen: Nasopharyngeal Swab; Respiratory  Result Value Ref Range Status   Adenovirus NOT DETECTED NOT DETECTED Final   Coronavirus 229E NOT DETECTED NOT DETECTED Final    Comment: (NOTE) The Coronavirus on the Respiratory  Panel, DOES NOT test for the novel  Coronavirus (2019 nCoV)    Coronavirus HKU1 NOT DETECTED NOT DETECTED Final   Coronavirus NL63 NOT DETECTED NOT DETECTED Final   Coronavirus OC43 NOT DETECTED NOT DETECTED Final   Metapneumovirus NOT DETECTED NOT DETECTED Final   Rhinovirus / Enterovirus NOT DETECTED NOT DETECTED Final   Influenza A NOT DETECTED NOT DETECTED Final   Influenza B NOT DETECTED NOT DETECTED Final   Parainfluenza Virus 1 NOT DETECTED NOT DETECTED Final   Parainfluenza Virus 2 NOT DETECTED NOT DETECTED Final   Parainfluenza Virus 3 NOT DETECTED NOT DETECTED Final   Parainfluenza Virus 4 NOT DETECTED NOT DETECTED Final   Respiratory Syncytial Virus NOT DETECTED NOT DETECTED Final   Bordetella pertussis NOT DETECTED NOT DETECTED Final   Bordetella Parapertussis NOT DETECTED NOT DETECTED Final   Chlamydophila pneumoniae NOT DETECTED NOT DETECTED Final   Mycoplasma pneumoniae NOT DETECTED NOT DETECTED Final    Comment: Performed at [facility], [address] [place], [place] [address]      Radiology Studies last 3 days: No results found.     Lattie Riege, DO [facility] 05/31/2023, 2:18 PM    Dictation software may have been used to generate the above note. Typos may occur and escape review in typed/dictated notes. Please contact Dr Marsa directly for clarity if needed.  Staff may message me via secure chat in Epic  but this may not receive an immediate response,  please page me for urgent matters!  If 7PM-7AM, please contact night coverage [email or web address]

## 2023-05-31 NOTE — Hospital Course (Addendum)
 Hospital course / significant events:  Christopher Mason is a 63 y.o. male with medical history significant of COPD, chronic hypoxic respiratory failure on 4L, schizophrenia, previous spinal surgery, hypothyroidism, hypothyroidism, bipolar disorder, tardive dyskinesia who presents to the ED with c/o SOB x several days.   01/28: admitted to hospitalist COPD exacerbation.   01/31 respiratory failure got worse, pulmonary consulted 02/01 still in significant respiratory failure, patient was placed on BiPAP 02/02 ABG 7.37/70/60/12 0.1/40/94.5%, hypercarbic respiratory failure with compensation, patient did not wear BiPAP last night. Continue BiPAP,  02/03 started Brovana  nebulizer twice daily, and continued Pulmicort  nebulizer twice daily 02/04 As per pulmonologist patient will need NIPPV for home, notified TOC for arrangement 02/05 NIPPV for home use has been arranged by TOC 02/06 weaning down O2 but still requiring high flow [place] during the day  Consultants:  Pulmonology   Procedures/Surgeries: none      ASSESSMENT & PLAN:   # Acute on chronic hypoxic respiratory failure with hypercapnia Patient is on 2 to 3 L oxygen at home Continue supplemental O2 admission and gradually wean down to baseline 2 to 3 L S/p BiPAP due to work of breathing, continue as needed    COPD exacerbation - see below could be due to viral infection RVP panel negative, negative COVID flu and RSV See below    Bacterial pneumonia reasonably ruled out  S/p one-time dose of azithromycin  and ceftriaxone .  WBC count within normal range, procalcitonin negative, less likely infection, so held antibiotics but restarted azithromycin  as per pulm.    Question HFpEF - see below - CHF exacerbation reasonably ruled out Chest x-ray low lung volumes, also concerning for vascular congestion.   TTE normal in May 2024 and normal BNP, which may be due to morbid obesity. 05/26/2023 TTE LVEF 55 to 60%, mild LV hypertrophy.  No any  other significant findings and no significant change from prior study. 2/4 decreased Lasix  40 mg IV daily    D-dimer 0.71, slightly elevated - Question VTE/PE but this is ruled out  venous duplex negative for DVT CTA chest 01/31: Negative for acute PE or thoracic aortic dissection. 2. Bilateral upper lobe and bibasilar infiltrates, left greater than right. 3. Trace left pleural effusion.   # COPD exacerbation  Severe COPD exacerbation, potentially due to viral infection. Maintained on DuoNebs, steroids, O2 as above  2/3 start Solu-Medrol  tapering and transition to prednisone  tapering Started Breo Ellipta  inhaler Mucinex  600 mg p.o. twice daily, Tussionex as needed Per pulmonary:  continue triple therapy via nebulizer (arformoterol , budesonide ), add revefenacin  (yupelri ) on d/c quick prednisone  taper as you are doing d/c on NIPPV and O2 therapy outpatient follow up with Dr. Tamea (scheduled 06/27/23)  For Home NIPPV Patients condition quickly deteriorates without ventilator. Removal of the ventilator may cause serious harm to the patient, exacerbation of condition and hospital readmission. Bilevel/RAD has been tried and failed to maintain or stabilize the patient. Bilevel cannot meet current volume requirements. Patient requires frequent durations of ventilatory support. Intermittent usage is insufficient.    # Hypokalemia, potassium repleted.  # Hypophosphatemia, nutritional deficiency, Phos repleted. Monitor electrolytes and replete as needed.   # HTN (hypertension) BP improving, it was low Resumed amlodipine  5 mg p.o. daily Monitor BP and titrate medications accordingly   # Lower extremity edema, resolved on 2/4 +2 pitting edema bilaterally, in the absence of a diagnosis of heart failure.  May be due to uncontrolled hypothyroidism. Venous duplex negative for DVT TSH 16 elevated and free  T4 level 1.0 wnl 1/30 started Lasix  40 mg IV twice daily 2/4 decreased Lasix  40 mg IV  daily 2/6 lasix  to po 40 mg bid    # Hypothyroidism Last TSH and free T4 in November 2024 demonstrating uncontrolled hypothyroidism. - Resume home Synthroid  150 mcg pod - TSH 16 elevated and free T4 level 1.0 wnl Recommend to follow-up with PCP and endocrinologist as an outpatient to titrate the dose of Synthroid  accordingly.   # Schizophrenia - Resume home regimen   # Tardive dyskinesia - Resume home regimen   # Vitamin B12 level 250, lower end, goal >400: Started vitamin B12 1000 mcg IM injection daily during hospital stay, followed by oral supplement.  Follow-up PCP to repeat vitamin B12 level after 3 to 6 months. # Vitamin D  level 26.8 insufficiency: started vitamin D  50,000 units p.o. weekly, follow with PCP to repeat vitamin D  level after 3 to 6 months.   # Constipation, started laxatives, constipation resolved, patient is moving bowels.    Class 2 obesity based on BMI: Body mass index is 36.26 kg/m.  Underweight - under 18  overweight - 25 to 29 obese - 30 or more Class 1 obesity: BMI of 30.0 to 34 Class 2 obesity: BMI of 35.0 to 39 Class 3 obesity: BMI of 40.0 to 49 Super Morbid Obesity: BMI 50-59 Super-super Morbid Obesity: BMI 60+ Significantly low or high BMI is associated with higher medical risk.  Weight management advised as adjunct to other disease management and risk reduction treatments    DVT prophylaxis: lovenox   IV fluids: no continuous IV fluids  Nutrition: cardiac/carb diet Central lines / invasive devices: none  Code Status: FULL CODE ACP documentation reviewed:  none on file in [facility]  TOC needs: home O2 and DME, home health Barriers to dispo / significant pending items: O2 requirement tapering down, high risk decompensation/readmission, expect wean down O2 next few days and anticipate d/c in 2-4 days

## 2023-05-31 NOTE — Plan of Care (Signed)

## 2023-05-31 NOTE — Progress Notes (Signed)
 Physical Therapy Treatment Patient Details Name: Christopher Mason MRN: [id number] DOB: 06-13-1960 Today's Date: 05/31/2023   History of Present Illness Pt is a 63 y.o. male presenting to hospital 05/22/23 with c/o SOB and fatigue.  Pt admitted with acute on chronic respiratory failure with hypoxia, COPD exacerbation, LE edema.  PMH includes h/o COPD on 4 L home O2, htn, schizophrenia, h/o spinal sx, bipolar disorder, tardive dyskinesia.    PT Comments  Pt is making gradual progress towards goals with ability to perform bed mobility/transfers with decreased assist. Pt very lethargic and reports he just wants to sleep. Pt agreeable to sit at EOB for eating breakfast but declines all offers to transfer/sit in recliner for meals stating the recliner is uncomfortable. Pt is making improvement in his functional mobility and is primarily limited by lethargy. Pt agreeable to sit at EOB for meals. O2 sats at 91% while at rest and 90% with exertion while on 6L of HFNC. Updated dispo recs and sent secure chat to [facility]. Needs are primarily custodial in nature at this time. Will continue to progress as able.   If plan is discharge home, recommend the following: A little help with walking and/or transfers;A little help with bathing/dressing/bathroom;Assistance with cooking/housework;Assist for transportation;Help with stairs or ramp for entrance   Can travel by private vehicle     Yes  Equipment Recommendations  Rolling walker (2 wheels)    Recommendations for Other Services       Precautions / Restrictions Precautions Precautions: Fall Restrictions Weight Bearing Restrictions Per Provider Order: No     Mobility  Bed Mobility Overal bed mobility: Modified Independent Bed Mobility: Supine to Sit, Sit to Supine     Supine to sit: Modified independent (Device/Increase time)     General bed mobility comments: pulls up on bed rails with increased effort. Once seated, able to sit with upright posture.  O2 sats monitored throughout    Transfers Overall transfer level: Modified independent Equipment used: None Transfers: Sit to/from Stand Sit to Stand: Modified independent (Device/Increase time)           General transfer comment: able to stand with good push off from seated surface. Once standing good static balance noted    Ambulation/Gait Ambulation/Gait assistance: Supervision Gait Distance (Feet): 10 Feet Assistive device: None Gait Pattern/deviations: Step-to pattern       General Gait Details: able to take lateral steps at bedside, limited by HFNC. Safe technique performed   Stairs             Wheelchair Mobility     Tilt Bed    Modified Rankin (Stroke Patients Only)       Balance Overall balance assessment: Needs assistance Sitting-balance support: No upper extremity supported, Feet supported Sitting balance-Leahy Scale: Good     Standing balance support: No upper extremity supported Standing balance-Leahy Scale: Fair                              Cognition Arousal: Lethargic Behavior During Therapy: WFL for tasks assessed/performed Overall Cognitive Status: Within Functional Limits for tasks assessed                                 General Comments: decreased motivated to participate        Exercises      General Comments        Pertinent  Vitals/Pain Pain Assessment Pain Assessment: No/denies pain    Home Living                          Prior Function            PT Goals (current goals can now be found in the care plan section) Acute Rehab PT Goals Patient Stated Goal: to improve breathing PT Goal Formulation: With patient Time For Goal Achievement: 06/07/23 Potential to Achieve Goals: Fair Progress towards PT goals: Progressing toward goals    Frequency    Min 1X/week      PT Plan      Co-evaluation              AM-PAC PT 6 Clicks Mobility   Outcome Measure   Help needed turning from your back to your side while in a flat bed without using bedrails?: None Help needed moving from lying on your back to sitting on the side of a flat bed without using bedrails?: None Help needed moving to and from a bed to a chair (including a wheelchair)?: A Little Help needed standing up from a chair using your arms (e.g., wheelchair or bedside chair)?: A Little Help needed to walk in hospital room?: A Little Help needed climbing 3-5 steps with a railing? : A Lot 6 Click Score: 19    End of Session Equipment Utilized During Treatment: Oxygen Activity Tolerance: Patient tolerated treatment well Patient left: in bed;with bed alarm set (seated at EOB to eat breakfast) Nurse Communication: Mobility status;Precautions PT Visit Diagnosis: Unsteadiness on feet (R26.81);Other abnormalities of gait and mobility (R26.89);Muscle weakness (generalized) (M62.81);History of falling (Z91.81)     Time: 9097-9074 PT Time Calculation (min) (ACUTE ONLY): 23 min  Charges:    $Gait Training: 23-37 mins PT General Charges $$ ACUTE PT VISIT: 1 Visit                     [name], PT, DPT, GCS [phone]    Enolia Koepke 05/31/2023, 12:45 PM

## 2023-06-01 DIAGNOSIS — J9621 Acute and chronic respiratory failure with hypoxia: Secondary | ICD-10-CM | POA: Diagnosis not present

## 2023-06-01 LAB — GLUCOSE, CAPILLARY
Glucose-Capillary: 90 mg/dL (ref 70–99)
Glucose-Capillary: 109 mg/dL — ABNORMAL HIGH (ref 70–99)

## 2023-06-01 MED ORDER — REVEFENACIN 175 MCG/3ML IN SOLN: 175.0000 ug | Freq: Every day | RESPIRATORY_TRACT | 0 refills | Status: DC

## 2023-06-01 MED ORDER — BISACODYL 5 MG PO TBEC: 10.0000 mg | DELAYED_RELEASE_TABLET | Freq: Every day | ORAL | Status: AC

## 2023-06-01 MED ORDER — PREDNISONE 10 MG PO TABS: ORAL_TABLET | ORAL | 0 refills | Status: AC

## 2023-06-01 MED ORDER — CYANOCOBALAMIN 1000 MCG PO TABS: 1000.0000 ug | ORAL_TABLET | Freq: Every day | ORAL | 0 refills | Status: DC

## 2023-06-01 MED ORDER — BUDESONIDE 0.5 MG/2ML IN SUSP: 0.5000 mg | Freq: Two times a day (BID) | RESPIRATORY_TRACT | 0 refills | Status: DC

## 2023-06-01 MED ORDER — POLYETHYLENE GLYCOL 3350 17 G PO PACK: 17.0000 g | PACK | Freq: Every day | ORAL | Status: AC | PRN

## 2023-06-01 MED ORDER — FUROSEMIDE 40 MG PO TABS: 40.0000 mg | ORAL_TABLET | Freq: Two times a day (BID) | ORAL | 0 refills | Status: DC

## 2023-06-01 MED ORDER — AMLODIPINE BESYLATE 10 MG PO TABS: 5.0000 mg | ORAL_TABLET | Freq: Every day | ORAL | Status: DC

## 2023-06-01 MED ORDER — ARFORMOTEROL TARTRATE 15 MCG/2ML IN NEBU: 15.0000 ug | INHALATION_SOLUTION | Freq: Two times a day (BID) | RESPIRATORY_TRACT | 0 refills | Status: DC

## 2023-06-01 MED ORDER — GUAIFENESIN ER 600 MG PO TB12: 600.0000 mg | ORAL_TABLET | Freq: Two times a day (BID) | ORAL | 0 refills | Status: DC

## 2023-06-01 NOTE — Progress Notes (Signed)
 PHARMACY CONSULT NOTE - ELECTROLYTES  Pharmacy Consult for Electrolyte Monitoring and Replacement   Recent Labs: Height: 5' 7 (170.2 cm) Weight: 105 kg (231 lb 7.7 oz) IBW/kg (Calculated) : 66.1 Estimated Creatinine Clearance: 104 mL/min (by C-G formula based on SCr of 0.84 mg/dL).  Potassium (mmol/L)  Date Value  05/31/2023 4.0   Magnesium  (mg/dL)  Date Value  97/95/7974 2.5 (H)   Calcium  (mg/dL)  Date Value  97/93/7974 8.9   Albumin  (g/dL)  Date Value  97/97/7974 3.4 (L)  03/19/2023 4.3   Phosphorus (mg/dL)  Date Value  97/95/7974 3.7   Sodium (mmol/L)  Date Value  05/31/2023 138  03/19/2023 141    Assessment  Christopher Mason is a 63 y.o. male presenting with SOB. PMH significant for COPD chronically on 2 L, HTN, GERD, and hypothyroidism. Pharmacy has been consulted to monitor and replace electrolytes.  Diet: heart healthy MIVF: N/A Pertinent medications: lasix  40mg  IV q12h > IV 40 mg daily. > PO lasix  40 mg BID.   Goal of Therapy: Electrolytes WNL  Plan:  No new labs. Will order labs for AM. Electrolytes have been stable.   Thank you for allowing pharmacy to be a part of this patient's care.  Cathaleen Blanch, PharmD, BCPS 06/01/2023 7:33 AM

## 2023-06-01 NOTE — TOC Transition Note (Signed)
 Transition of Care [facility]) - Discharge Note   Patient Details  Name: Christopher Mason MRN: [id number] Date of Birth: 1960/06/12  [facility] CM/SW Contact:  Imri Lor C Nishant Schrecengost, RN Phone Number: 06/01/2023, 4:03 PM   Clinical Narrative:    Spoke with patient at bedisde regarding discharge home today. He friend Pasty will come pick him up when gets off work at  5pm Oxygen is in his room for transport  Per Mitch at [employer] the high concentrator in addition to NIV will be delivered to patient's home this evening   TOC signing off       Barriers to Discharge: Continued Medical Work up   Patient Goals and CMS Choice Patient states their goals for this hospitalization and ongoing recovery are:: refuses SNF CMS Medicare.gov Compare Post Acute Care list provided to:: Patient Choice offered to / list presented to : Patient      Discharge Placement                       Discharge Plan and Services Additional resources added to the After Visit Summary for                                       Social Drivers of Health (SDOH) Interventions SDOH Screenings   Food Insecurity: No Food Insecurity (05/25/2023)  Housing: Low Risk  (05/25/2023)  Transportation Needs: No Transportation Needs (05/25/2023)  Utilities: Not At Risk (05/25/2023)  Alcohol Screen: Low Risk  (09/06/2021)  Depression (PHQ2-9): Low Risk  (09/06/2021)  Tobacco Use: Medium Risk (05/25/2023)     Readmission Risk Interventions     No data to display

## 2023-06-01 NOTE — TOC Progression Note (Addendum)
 Transition of Care [facility]) - Progression Note    Patient Details  Name: Christopher Mason MRN: [id number] Date of Birth: 1960/06/08  [facility]) CM/SW Contact  [name], RN Phone Number: 06/01/2023, 11:20 AM  Clinical Narrative:    Spoke with Mitch from [employer]. Patient currently has a Product Manager at home. They are able to switch him to a 10L if patient is unable to be weaned down.   11:35am Attempt to reach [employer] [facility] at [phone]. No answer. Message sent requesting return call to this RNCM.   1:40pm Attempt to reach [place] from [facility], No answer. Left a message.      Expected Discharge Plan: Home w Home Health Services Barriers to Discharge: Continued Medical Work up  Expected Discharge Plan and Services       Living arrangements for the past 2 months: Single Family Home                                       Social Determinants of Health (SDOH) Interventions SDOH Screenings   Food Insecurity: No Food Insecurity (05/25/2023)  Housing: Low Risk  (05/25/2023)  Transportation Needs: No Transportation Needs (05/25/2023)  Utilities: Not At Risk (05/25/2023)  Alcohol Screen: Low Risk  (09/06/2021)  Depression (PHQ2-9): Low Risk  (09/06/2021)  Tobacco Use: Medium Risk (05/25/2023)    Readmission Risk Interventions     No data to display

## 2023-06-01 NOTE — Discharge Summary (Signed)
 Physician Discharge Summary   Patient: Christopher Mason MRN: [id number]  DOB: 04-10-1961   Admit:     Date of Admission: 05/22/2023 Admitted from: home   Discharge: Date of discharge: 06/01/23 Disposition: Home Condition at discharge: fair  CODE STATUS: FULL CODE     Discharge Physician: Laneta Blunt, DO [facility]     PCP: Liana Fish, NP  Recommendations for Outpatient Follow-up:  Follow up with PCP Liana Fish, NP in 1-2 weeks  Discharge Instructions     Diet - low sodium heart healthy   Complete by: As directed    Discharge instructions   Complete by: As directed    Home oxygen nasal cannula maximum 10 L/min and BiPAP while asleep  Follow with pulmonology   Increase activity slowly   Complete by: As directed          Discharge Diagnoses: Principal Problem:   Acute on chronic respiratory failure with hypoxia (HCC) Active Problems:   COPD exacerbation (HCC)   HTN (hypertension)   Lower extremity edema   Hypothyroidism   Tardive dyskinesia   Schizophrenia [facility])        Hospital course / significant events:  Christopher Mason is a 63 y.o. male with medical history significant of COPD, chronic hypoxic respiratory failure on 4L, schizophrenia, previous spinal surgery, hypothyroidism, hypothyroidism, bipolar disorder, tardive dyskinesia who presents to the ED with c/o SOB x several days.   01/28: admitted to hospitalist COPD exacerbation.   01/31 respiratory failure got worse, pulmonary consulted 02/01 still in significant respiratory failure, patient was placed on BiPAP 02/02 ABG 7.37/70/60/12 0.1/40/94.5%, hypercarbic respiratory failure with compensation, patient did not wear BiPAP last night. Continue BiPAP,  02/03 started Brovana  nebulizer twice daily, and continued Pulmicort  nebulizer twice daily 02/04 As per pulmonologist patient will need NIPPV for home, notified TOC for arrangement 02/05 NIPPV for home use has  been arranged by TOC 02/06 weaning down O2 but still requiring high flow Christopher Mason during the day 02/07 stable at 4-6L O2 Christopher Mason, pt is requesting for discharge home, has decliend SNF, is aware we have not been able to arrange home health. We have confirmed he has compressor at home for higher flow O2 and he has BiPap for nightly use, pt expresses understanding of use of these measures to wean slowly per PCP/pulmonary   Consultants:  Pulmonology   Procedures/Surgeries: none      ASSESSMENT & PLAN:   # Acute on chronic hypoxic respiratory failure with hypercapnia Patient is on 2 to 3 L oxygen at home Continue supplemental O2 Christopher Mason and BiPAP    COPD exacerbation - see below could be due to viral infection RVP panel negative, negative COVID flu and RSV See below    Bacterial pneumonia reasonably ruled out  S/p one-time dose of azithromycin  and ceftriaxone .  WBC count within normal range, procalcitonin negative, less likely infection, so held antibiotics but restarted azithromycin  as per pulm.    Question HFpEF - see below - CHF exacerbation reasonably ruled out Chest x-ray low lung volumes, also concerning for vascular congestion.   TTE normal in May 2024 and normal BNP, which may be due to morbid obesity. 05/26/2023 TTE LVEF 55 to 60%, mild LV hypertrophy.  No any other significant findings and no significant change from prior study. 2/4 decreased Lasix      D-dimer 0.71, slightly elevated - Question VTE/PE but this is ruled out  venous duplex negative for DVT CTA chest 01/31: Negative for acute  PE or thoracic aortic dissection. 2. Bilateral upper lobe and bibasilar infiltrates, left greater than right. 3. Trace left pleural effusion.   # COPD exacerbation  Mucinex  600 mg p.o. twice daily, Per pulmonary:  continue triple therapy via nebulizer (arformoterol , budesonide ), add revefenacin  (yupelri ) on d/c quick prednisone  taper d/c on NIPPV and O2 therapy outpatient follow up with Dr. Tamea  (scheduled 06/27/23)  For Home NIPPV Patients condition quickly deteriorates without ventilator. Removal of the ventilator may cause serious harm to the patient, exacerbation of condition and hospital readmission. Bilevel/RAD has been tried and failed to maintain or stabilize the patient. Bilevel cannot meet current volume requirements. Patient requires frequent durations of ventilatory support. Intermittent usage is insufficient.    # Hypokalemia, potassium repleted.  # Hypophosphatemia, nutritional deficiency, Phos repleted. Monitor electrolytes and replete as needed.   # HTN (hypertension) BP improving, it was low Monitor BP and titrate medications accordingly - helpd amlodipine  pending PCP f/u    # Lower extremity edema, resolved on 2/4 +2 pitting edema bilaterally, in the absence of a diagnosis of heart failure.  May be due to uncontrolled hypothyroidism. Venous duplex negative for DVT TSH 16 elevated and free T4 level 1.0 wnl 1/30 started Lasix  40 mg IV twice daily 2/4 decreased Lasix  40 mg IV daily 2/6 lasix  to po 40 mg bid    # Hypothyroidism Last TSH and free T4 in November 2024 demonstrating uncontrolled hypothyroidism. - Resume home Synthroid  150 mcg pod - TSH 16 elevated and free T4 level 1.0 wnl Recommend to follow-up with PCP and endocrinologist as an outpatient to titrate the dose of Synthroid  accordingly.   # Schizophrenia - Resume home regimen   # Tardive dyskinesia - Resume home regimen   # Vitamin B12 level 250, lower end, goal >400: Started vitamin B12 1000 mcg IM injection daily during hospital stay, followed by oral supplement.  Follow-up PCP to repeat vitamin B12 level after 3 to 6 months. # Vitamin D  level 26.8 insufficiency: started vitamin D  50,000 units p.o. weekly, follow with PCP to repeat vitamin D  level after 3 to 6 months.   # Constipation, started laxatives, constipation resolved, patient is moving bowels.    Class 2 obesity based on BMI: Body  mass index is 36.26 kg/m.  Underweight - under 18  overweight - 25 to 29 obese - 30 or more Class 1 obesity: BMI of 30.0 to 34 Class 2 obesity: BMI of 35.0 to 39 Class 3 obesity: BMI of 40.0 to 49 Super Morbid Obesity: BMI 50-59 Super-super Morbid Obesity: BMI 60+ Significantly low or high BMI is associated with higher medical risk.  Weight management advised as adjunct to other disease management and risk reduction treatments       Discharge Instructions  Allergies as of 06/01/2023       Reactions   Aspirin Nausea And Vomiting, Swelling        Medication List     STOP taking these medications    ALPRAZolam 0.25 MG tablet Commonly known as: XANAX   ipratropium-albuterol  0.5-2.5 (3) MG/3ML Soln Commonly known as: DUONEB   nicotine  21 mg/24hr patch Commonly known as: NICODERM CQ  - dosed in mg/24 hours   propranolol  10 MG tablet Commonly known as: INDERAL        TAKE these medications    albuterol  108 (90 Base) MCG/ACT inhaler Commonly known as: VENTOLIN  HFA Inhale 2 puffs into the lungs every 6 (six) hours as needed for wheezing or shortness of breath.  amitriptyline  25 MG tablet Commonly known as: ELAVIL  Take 1 tablet (25 mg total) by mouth at bedtime.   amLODipine  10 MG tablet Commonly known as: NORVASC  Take 0.5 tablets (5 mg total) by mouth daily. What changed: how much to take   arformoterol  15 MCG/2ML Nebu Commonly known as: BROVANA  Take 2 mLs (15 mcg total) by nebulization 2 (two) times daily.   atorvastatin  40 MG tablet Commonly known as: LIPITOR Take 1 tablet (40 mg total) by mouth daily.   bisacodyl  5 MG EC tablet Commonly known as: DULCOLAX Take 2 tablets (10 mg total) by mouth at bedtime.   budesonide  0.5 MG/2ML nebulizer solution Commonly known as: PULMICORT  Take 2 mLs (0.5 mg total) by nebulization 2 (two) times daily.   cyanocobalamin  1000 MCG tablet Take 1 tablet (1,000 mcg total) by mouth daily. Start taking on: June 02, 2023   docusate sodium  50 MG capsule Commonly known as: COLACE Take 1 capsule (50 mg total) by mouth daily.   furosemide  40 MG tablet Commonly known as: LASIX  Take 1 tablet (40 mg total) by mouth 2 (two) times daily with breakfast and lunch. Start taking on: June 02, 2023 What changed: when to take this   gabapentin  100 MG capsule Commonly known as: NEURONTIN  Take 1 capsule (100 mg total) by mouth 3 (three) times daily.   guaiFENesin  600 MG 12 hr tablet Commonly known as: MUCINEX  Take 1 tablet (600 mg total) by mouth 2 (two) times daily.   Invega  Sustenna 234 MG/1.5ML injection Generic drug: paliperidone  Inject into the muscle. Every 4 weeks.   levothyroxine  150 MCG tablet Commonly known as: SYNTHROID  Take 1 tablet (150 mcg total) by mouth daily before breakfast. On empty stomach, wait 30 minutes before eating or taking other medications.   multivitamin with minerals Tabs tablet Take 1 tablet by mouth daily.   paliperidone  3 MG 24 hr tablet Commonly known as: INVEGA  Take 3 mg by mouth at bedtime.   pantoprazole  40 MG tablet Commonly known as: PROTONIX  Take 1 tablet (40 mg total) by mouth daily.   polyethylene glycol 17 g packet Commonly known as: MIRALAX  / GLYCOLAX  Take 17 g by mouth daily as needed for mild constipation. What changed:  how much to take when to take this reasons to take this   predniSONE  10 MG tablet Commonly known as: DELTASONE  Take 3 tablets (30 mg total) by mouth daily with breakfast for 1 day, THEN 2 tablets (20 mg total) daily with breakfast for 3 days, THEN 1 tablet (10 mg total) daily with breakfast for 3 days. Start taking on: June 02, 2023   revefenacin  175 MCG/3ML nebulizer solution Commonly known as: YUPELRI  Take 3 mLs (175 mcg total) by nebulization daily.   thiamine  100 MG tablet Commonly known as: Vitamin B-1 Take 1 tablet (100 mg total) by mouth daily.   traZODone  100 MG tablet Commonly known as: DESYREL  Take 100 mg  by mouth at bedtime.   venlafaxine  100 MG tablet Commonly known as: EFFEXOR  Take 100 mg by mouth 2 (two) times daily.   Vitamin D  (Ergocalciferol ) 1.25 MG (50000 UNIT) Caps capsule Commonly known as: DRISDOL  Take 1 capsule (50,000 Units total) by mouth every 7 (seven) days.          Allergies  Allergen Reactions   Aspirin Nausea And Vomiting and Swelling     Subjective: pt reports breathing is okay , he is requesting for discharge home today, he misses his dog. He denies chest pain, denies  SOB at rest, denies N/V, denies any other concerns    Discharge Exam: BP 109/83   Pulse 80   Temp 98 F (36.7 C) (Oral)   Resp 18   Ht 5' 7 (1.702 m)   Wt 105 kg   SpO2 96%   BMI 36.26 kg/m  General: Pt is alert, awake, not in acute distress Cardiovascular: RRR Respiratory: diminished breath sounds all fields  Abdominal: Soft, NT, ND Extremities: no edema, no cyanosis     The results of significant diagnostics from this hospitalization (including imaging, microbiology, ancillary and laboratory) are listed below for reference.     Microbiology: Recent Results (from the past 240 hours)  Resp panel by RT-PCR (RSV, Flu A&B, Covid) Anterior Nasal Swab     Status: None   Collection Time: 05/22/23  4:09 PM   Specimen: Anterior Nasal Swab  Result Value Ref Range Status   SARS Coronavirus 2 by RT PCR NEGATIVE NEGATIVE Final    Comment: (NOTE) SARS-CoV-2 target nucleic acids are NOT DETECTED.  The SARS-CoV-2 RNA is generally detectable in upper respiratory specimens during the acute phase of infection. The lowest concentration of SARS-CoV-2 viral copies this assay can detect is 138 copies/mL. A negative result does not preclude SARS-Cov-2 infection and should not be used as the sole basis for treatment or other patient management decisions. A negative result may occur with  improper specimen collection/handling, submission of specimen other than nasopharyngeal swab, presence  of viral mutation(s) within the areas targeted by this assay, and inadequate number of viral copies(<138 copies/mL). A negative result must be combined with clinical observations, patient history, and epidemiological information. The expected result is Negative.  Fact Sheet for Patients:  bloggercourse.com  Fact Sheet for Healthcare Providers:  seriousbroker.it  This test is no t yet approved or cleared by the [place]  FDA and  has been authorized for detection and/or diagnosis of SARS-CoV-2 by FDA under an Emergency Use Authorization (EUA). This EUA will remain  in effect (meaning this test can be used) for the duration of the COVID-19 declaration under Section 564(b)(1) of the Act, 21 U.S.C.section 360bbb-3(b)(1), unless the authorization is terminated  or revoked sooner.       Influenza A by PCR NEGATIVE NEGATIVE Final   Influenza B by PCR NEGATIVE NEGATIVE Final    Comment: (NOTE) The Xpert Xpress SARS-CoV-2/FLU/RSV plus assay is intended as an aid in the diagnosis of influenza from Nasopharyngeal swab specimens and should not be used as a sole basis for treatment. Nasal washings and aspirates are unacceptable for Xpert Xpress SARS-CoV-2/FLU/RSV testing.  Fact Sheet for Patients: bloggercourse.com  Fact Sheet for Healthcare Providers: seriousbroker.it  This test is not yet approved or cleared by the [place]  FDA and has been authorized for detection and/or diagnosis of SARS-CoV-2 by FDA under an Emergency Use Authorization (EUA). This EUA will remain in effect (meaning this test can be used) for the duration of the COVID-19 declaration under Section 564(b)(1) of the Act, 21 U.S.C. section 360bbb-3(b)(1), unless the authorization is terminated or revoked.     Resp Syncytial Virus by PCR NEGATIVE NEGATIVE Final    Comment: (NOTE) Fact Sheet for  Patients: bloggercourse.com  Fact Sheet for Healthcare Providers: seriousbroker.it  This test is not yet approved or cleared by the [place]  FDA and has been authorized for detection and/or diagnosis of SARS-CoV-2 by FDA under an Emergency Use Authorization (EUA). This EUA will remain in effect (meaning this test can be used) for the  duration of the COVID-19 declaration under Section 564(b)(1) of the Act, 21 U.S.C. section 360bbb-3(b)(1), unless the authorization is terminated or revoked.  Performed at [facility], [address] [place], [place] [address]   Respiratory (~20 pathogens) panel by PCR     Status: None   Collection Time: 05/22/23  7:42 PM   Specimen: Nasopharyngeal Swab; Respiratory  Result Value Ref Range Status   Adenovirus NOT DETECTED NOT DETECTED Final   Coronavirus 229E NOT DETECTED NOT DETECTED Final    Comment: (NOTE) The Coronavirus on the Respiratory Panel, DOES NOT test for the novel  Coronavirus (2019 nCoV)    Coronavirus HKU1 NOT DETECTED NOT DETECTED Final   Coronavirus NL63 NOT DETECTED NOT DETECTED Final   Coronavirus OC43 NOT DETECTED NOT DETECTED Final   Metapneumovirus NOT DETECTED NOT DETECTED Final   Rhinovirus / Enterovirus NOT DETECTED NOT DETECTED Final   Influenza A NOT DETECTED NOT DETECTED Final   Influenza B NOT DETECTED NOT DETECTED Final   Parainfluenza Virus 1 NOT DETECTED NOT DETECTED Final   Parainfluenza Virus 2 NOT DETECTED NOT DETECTED Final   Parainfluenza Virus 3 NOT DETECTED NOT DETECTED Final   Parainfluenza Virus 4 NOT DETECTED NOT DETECTED Final   Respiratory Syncytial Virus NOT DETECTED NOT DETECTED Final   Bordetella pertussis NOT DETECTED NOT DETECTED Final   Bordetella Parapertussis NOT DETECTED NOT DETECTED Final   Chlamydophila pneumoniae NOT DETECTED NOT DETECTED Final   Mycoplasma pneumoniae NOT DETECTED NOT DETECTED Final    Comment: Performed at  [facility], [address], [place], [place] [address]     Labs: BNP (last 3 results) Recent Labs    08/16/22 1132 08/28/22 1406 05/22/23 1303  BNP 28.0 47.3 90.9   Basic Metabolic Panel: Recent Labs  Lab 05/26/23 0404 05/27/23 0503 05/27/23 0504 05/28/23 0250 05/29/23 0633 05/30/23 0527 05/31/23 0615  NA 140  --  139 141 136 136 138  K 4.3  --  4.0 4.4 3.8 4.3 4.0  CL 96*  --  96* 95* 94* 97* 99  CO2 35*  --  34* 36* 32 32 34*  GLUCOSE 122*  --  117* 116* 98 95 96  BUN 18  --  21 28* 25* 23 24*  CREATININE 0.79  --  0.90 0.95 0.79 0.82 0.84  CALCIUM  9.2  --  9.6 9.5 8.9 9.2 8.9  MG 2.3 2.6*  --  2.5* 2.5*  --   --   PHOS 3.2 3.6 3.5 4.1 3.7  --   --    Liver Function Tests: Recent Labs  Lab 05/27/23 0504  ALBUMIN  3.4*   No results for input(s): LIPASE, AMYLASE in the last 168 hours. No results for input(s): AMMONIA in the last 168 hours. CBC: Recent Labs  Lab 05/27/23 0503 05/28/23 0250 05/29/23 9366 05/30/23 0527 05/31/23 0615  WBC 9.9 8.8 9.8 9.1 8.8  HGB 11.3* 10.9* 11.4* 11.6* 11.5*  HCT 36.3* 35.7* 36.2* 37.0* 36.4*  MCV 98.9 100.0 96.3 96.9 94.8  PLT 210 219 199 217 217   Cardiac Enzymes: No results for input(s): CKTOTAL, CKMB, CKMBINDEX, TROPONINI in the last 168 hours. BNP: Invalid input(s): POCBNP CBG: Recent Labs  Lab 05/31/23 1134 05/31/23 1609 05/31/23 2120 06/01/23 0906 06/01/23 1154  GLUCAP 169* 120* 105* 90 109*   D-Dimer No results for input(s): DDIMER in the last 72 hours. Hgb A1c No results for input(s): HGBA1C in the last 72 hours. Lipid Profile No results for input(s):  CHOL, HDL, LDLCALC, TRIG, CHOLHDL, LDLDIRECT in the last 72 hours. Thyroid  function studies No results for input(s): TSH, T4TOTAL, T3FREE, THYROIDAB in the last 72 hours.  Invalid input(s): FREET3 Anemia work up No results for input(s): VITAMINB12, FOLATE, FERRITIN, TIBC, IRON , RETICCTPCT in  the last 72 hours. Urinalysis    Component Value Date/Time   COLORURINE YELLOW (A) 08/16/2022 2015   APPEARANCEUR CLEAR (A) 08/16/2022 2015   APPEARANCEUR Clear 03/02/2020 0916   LABSPEC 1.019 08/16/2022 2015   PHURINE 5.0 08/16/2022 2015   GLUCOSEU NEGATIVE 08/16/2022 2015   HGBUR SMALL (A) 08/16/2022 2015   BILIRUBINUR NEGATIVE 08/16/2022 2015   BILIRUBINUR Negative 03/02/2020 0916   KETONESUR NEGATIVE 08/16/2022 2015   PROTEINUR NEGATIVE 08/16/2022 2015   NITRITE NEGATIVE 08/16/2022 2015   LEUKOCYTESUR NEGATIVE 08/16/2022 2015   Sepsis Labs Recent Labs  Lab 05/28/23 0250 05/29/23 0633 05/30/23 0527 05/31/23 0615  WBC 8.8 9.8 9.1 8.8   Microbiology Recent Results (from the past 240 hours)  Resp panel by RT-PCR (RSV, Flu A&B, Covid) Anterior Nasal Swab     Status: None   Collection Time: 05/22/23  4:09 PM   Specimen: Anterior Nasal Swab  Result Value Ref Range Status   SARS Coronavirus 2 by RT PCR NEGATIVE NEGATIVE Final    Comment: (NOTE) SARS-CoV-2 target nucleic acids are NOT DETECTED.  The SARS-CoV-2 RNA is generally detectable in upper respiratory specimens during the acute phase of infection. The lowest concentration of SARS-CoV-2 viral copies this assay can detect is 138 copies/mL. A negative result does not preclude SARS-Cov-2 infection and should not be used as the sole basis for treatment or other patient management decisions. A negative result may occur with  improper specimen collection/handling, submission of specimen other than nasopharyngeal swab, presence of viral mutation(s) within the areas targeted by this assay, and inadequate number of viral copies(<138 copies/mL). A negative result must be combined with clinical observations, patient history, and epidemiological information. The expected result is Negative.  Fact Sheet for Patients:  bloggercourse.com  Fact Sheet for Healthcare Providers:   seriousbroker.it  This test is no t yet approved or cleared by the [place]  FDA and  has been authorized for detection and/or diagnosis of SARS-CoV-2 by FDA under an Emergency Use Authorization (EUA). This EUA will remain  in effect (meaning this test can be used) for the duration of the COVID-19 declaration under Section 564(b)(1) of the Act, 21 U.S.C.section 360bbb-3(b)(1), unless the authorization is terminated  or revoked sooner.       Influenza A by PCR NEGATIVE NEGATIVE Final   Influenza B by PCR NEGATIVE NEGATIVE Final    Comment: (NOTE) The Xpert Xpress SARS-CoV-2/FLU/RSV plus assay is intended as an aid in the diagnosis of influenza from Nasopharyngeal swab specimens and should not be used as a sole basis for treatment. Nasal washings and aspirates are unacceptable for Xpert Xpress SARS-CoV-2/FLU/RSV testing.  Fact Sheet for Patients: bloggercourse.com  Fact Sheet for Healthcare Providers: seriousbroker.it  This test is not yet approved or cleared by the [place]  FDA and has been authorized for detection and/or diagnosis of SARS-CoV-2 by FDA under an Emergency Use Authorization (EUA). This EUA will remain in effect (meaning this test can be used) for the duration of the COVID-19 declaration under Section 564(b)(1) of the Act, 21 U.S.C. section 360bbb-3(b)(1), unless the authorization is terminated or revoked.     Resp Syncytial Virus by PCR NEGATIVE NEGATIVE Final    Comment: (NOTE) Fact Sheet for Patients: bloggercourse.com  Fact Sheet for Healthcare Providers: seriousbroker.it  This test is not yet approved or cleared by the [place]  FDA and has been authorized for detection and/or diagnosis of SARS-CoV-2 by FDA under an Emergency Use Authorization (EUA). This EUA will remain in effect (meaning this test can be used) for  the duration of the COVID-19 declaration under Section 564(b)(1) of the Act, 21 U.S.C. section 360bbb-3(b)(1), unless the authorization is terminated or revoked.  Performed at [facility], [address] [place], [place] [address]   Respiratory (~20 pathogens) panel by PCR     Status: None   Collection Time: 05/22/23  7:42 PM   Specimen: Nasopharyngeal Swab; Respiratory  Result Value Ref Range Status   Adenovirus NOT DETECTED NOT DETECTED Final   Coronavirus 229E NOT DETECTED NOT DETECTED Final    Comment: (NOTE) The Coronavirus on the Respiratory Panel, DOES NOT test for the novel  Coronavirus (2019 nCoV)    Coronavirus HKU1 NOT DETECTED NOT DETECTED Final   Coronavirus NL63 NOT DETECTED NOT DETECTED Final   Coronavirus OC43 NOT DETECTED NOT DETECTED Final   Metapneumovirus NOT DETECTED NOT DETECTED Final   Rhinovirus / Enterovirus NOT DETECTED NOT DETECTED Final   Influenza A NOT DETECTED NOT DETECTED Final   Influenza B NOT DETECTED NOT DETECTED Final   Parainfluenza Virus 1 NOT DETECTED NOT DETECTED Final   Parainfluenza Virus 2 NOT DETECTED NOT DETECTED Final   Parainfluenza Virus 3 NOT DETECTED NOT DETECTED Final   Parainfluenza Virus 4 NOT DETECTED NOT DETECTED Final   Respiratory Syncytial Virus NOT DETECTED NOT DETECTED Final   Bordetella pertussis NOT DETECTED NOT DETECTED Final   Bordetella Parapertussis NOT DETECTED NOT DETECTED Final   Chlamydophila pneumoniae NOT DETECTED NOT DETECTED Final   Mycoplasma pneumoniae NOT DETECTED NOT DETECTED Final    Comment: Performed at [facility], [address], [place], [place] [address]   Imaging ECHOCARDIOGRAM COMPLETE Result Date: 05/27/2023    ECHOCARDIOGRAM REPORT   Patient Name:   DEMARCUS THIELKE Date of Exam: 05/26/2023 Medical Rec #:  [id number]           Height:       67.0 in Accession #:    [id number]          Weight:       231.5 lb Date of Birth:  [birth year]            BSA:          2.152 m Patient  Age:    62 years            BP:           121/95 mmHg Patient Gender: M                   HR:           92 bpm. Exam Location:  [facility] Procedure: 2D Echo Indications:     CHF- Acute Systolic I50.21  History:         Patient has prior history of Echocardiogram examinations.  Sonographer:     Bernice Rubinstein RDCS Referring Phys:  [id number] INGE JONETTA LECHER Diagnosing Phys: Shelda Bruckner MD  Sonographer Comments: Technically difficult study due to poor echo windows, no subcostal window and patient is obese. Image acquisition challenging due to patient body habitus and Image acquisition challenging due to respiratory motion. IMPRESSIONS  1. Left ventricular ejection fraction, by estimation, is 55 to 60%. The left ventricle has  normal function. Left ventricular endocardial border not optimally defined to evaluate regional wall motion. There is mild concentric left ventricular hypertrophy. Left ventricular diastolic parameters were normal.  2. Right ventricular systolic function is normal. The right ventricular size is normal. Tricuspid regurgitation signal is inadequate for assessing PA pressure.  3. The mitral valve is grossly normal. Trivial mitral valve regurgitation. No evidence of mitral stenosis.  4. The aortic valve was not well visualized. Aortic valve regurgitation is not visualized. No aortic stenosis is present.  5. Aortic dilatation noted. There is borderline dilatation of the ascending aorta, measuring 39 mm. Comparison(s): No significant change from prior study. Conclusion(s)/Recommendation(s): Technically challenging study with limited views. Lateral wall not completely seen, but no obvious wall motion abnormalities and normal LVEF. No significant valve disease by Doppler. FINDINGS  Left Ventricle: Left ventricular ejection fraction, by estimation, is 55 to 60%. The left ventricle has normal function. Left ventricular endocardial border not optimally defined to evaluate regional wall motion. The left  ventricular internal cavity size was normal in size. There is mild concentric left ventricular hypertrophy. Left ventricular diastolic parameters were normal. Right Ventricle: The right ventricular size is normal. Right vetricular wall thickness was not well visualized. Right ventricular systolic function is normal. Tricuspid regurgitation signal is inadequate for assessing PA pressure. Left Atrium: Left atrial size was normal in size. Right Atrium: Right atrial size was normal in size. Pericardium: There is no evidence of pericardial effusion. Mitral Valve: The mitral valve is grossly normal. Trivial mitral valve regurgitation. No evidence of mitral valve stenosis. Tricuspid Valve: The tricuspid valve is not well visualized. Tricuspid valve regurgitation is not demonstrated. No evidence of tricuspid stenosis. Aortic Valve: The aortic valve was not well visualized. Aortic valve regurgitation is not visualized. No aortic stenosis is present. Aortic valve peak gradient measures 9.5 mmHg. Pulmonic Valve: The pulmonic valve was not well visualized. Pulmonic valve regurgitation is not visualized. No evidence of pulmonic stenosis. Aorta: Aortic dilatation noted. There is borderline dilatation of the ascending aorta, measuring 39 mm. Venous: The inferior vena cava was not well visualized. IAS/Shunts: The interatrial septum was not well visualized.  LEFT VENTRICLE PLAX 2D LVIDd:         5.00 cm   Diastology LVIDs:         3.30 cm   LV e' lateral:   11.20 cm/s LV PW:         1.20 cm   LV E/e' lateral: 4.1 LV IVS:        1.30 cm LVOT diam:     2.20 cm LV SV:         101 LV SV Index:   47 LVOT Area:     3.80 cm  RIGHT VENTRICLE RV Basal diam:  3.70 cm RV S prime:     19.20 cm/s TAPSE (M-mode): 2.6 cm LEFT ATRIUM           Index        RIGHT ATRIUM           Index LA Vol (A2C): 50.7 ml 23.56 ml/m  RA Area:     19.60 cm LA Vol (A4C): 45.2 ml 21.00 ml/m  RA Volume:   53.80 ml  25.00 ml/m  AORTIC VALVE                   PULMONIC VALVE AV Area (Vmax): 3.74 cm      PV Vmax:        1.39 m/s  AV Vmax:        154.50 cm/s   PV Peak grad:   7.7 mmHg AV Peak Grad:   9.5 mmHg      RVOT Peak grad: 6 mmHg LVOT Vmax:      152.00 cm/s LVOT Vmean:     103.000 cm/s LVOT VTI:       0.265 m  AORTA Ao Root diam: 3.90 cm MITRAL VALVE MV Area (PHT): 4.17 cm     SHUNTS MV Decel Time: 182 msec     Systemic VTI:  0.26 m MV E velocity: 45.78 cm/s   Systemic Diam: 2.20 cm MV A velocity: 108.00 cm/s MV E/A ratio:  0.42 Shelda Bruckner MD Electronically signed by Shelda Bruckner MD Signature Date/Time: 05/27/2023/2:24:04 PM    Final       Time coordinating discharge: over 30 minutes  SIGNED:  Krisa Blattner DO [facility]

## 2023-06-01 NOTE — Progress Notes (Signed)
 Physical Therapy Treatment Patient Details Name: Christopher Mason MRN: [id number] DOB: April 02, 1961 Today's Date: 06/01/2023   History of Present Illness Pt is a 63 y.o. male presenting to hospital 05/22/23 with c/o SOB and fatigue.  Pt admitted with acute on chronic respiratory failure with hypoxia, COPD exacerbation, LE edema.  PMH includes h/o COPD on 4 L home O2, htn, schizophrenia, h/o spinal sx, bipolar disorder, tardive dyskinesia.    PT Comments  Pt is making good progress towards goals and is motivated to participate this date. Able to ambulate in hallway on 6L with ability to maintain sats. Slight SOB symptoms, however cued for PLB and self pacing. Pt reports he has RW at home and is hopeful to dc today. Messaged via secure chat with care team to update. Will continue to progress.    If plan is discharge home, recommend the following: A little help with walking and/or transfers;A little help with bathing/dressing/bathroom;Assistance with cooking/housework;Assist for transportation;Help with stairs or ramp for entrance   Can travel by private vehicle     Yes  Equipment Recommendations  None recommended by PT    Recommendations for Other Services       Precautions / Restrictions Precautions Precautions: Fall Restrictions Weight Bearing Restrictions Per Provider Order: No     Mobility  Bed Mobility Overal bed mobility: Modified Independent Bed Mobility: Supine to Sit, Sit to Supine     Supine to sit: Modified independent (Device/Increase time) Sit to supine: Modified independent (Device/Increase time)   General bed mobility comments: safe technique with ease of transition    Transfers Overall transfer level: Modified independent Equipment used: None Transfers: Sit to/from Stand Sit to Stand: Modified independent (Device/Increase time)           General transfer comment: able to stand from low surface with cues for sequencing. Once standing given RW to assist  with balance    Ambulation/Gait Ambulation/Gait assistance: Supervision Gait Distance (Feet): 120 Feet Assistive device: Rolling walker (2 wheels) Gait Pattern/deviations: Step-through pattern       General Gait Details: ambulated in hallway with safe technique and reciprocal gait pattern. RW used and O2 sats monitored at 6L with sats maintaining at 90%. Cues for PLB and self pacing.   Stairs             Wheelchair Mobility     Tilt Bed    Modified Rankin (Stroke Patients Only)       Balance Overall balance assessment: Needs assistance Sitting-balance support: No upper extremity supported, Feet supported Sitting balance-Leahy Scale: Good     Standing balance support: No upper extremity supported Standing balance-Leahy Scale: Fair                              Cognition Arousal: Alert Behavior During Therapy: WFL for tasks assessed/performed Overall Cognitive Status: Within Functional Limits for tasks assessed                                 General Comments: alert and oriented, agreeable to session        Exercises      General Comments        Pertinent Vitals/Pain Pain Assessment Pain Assessment: No/denies pain    Home Living  Prior Function            PT Goals (current goals can now be found in the care plan section) Acute Rehab PT Goals Patient Stated Goal: to improve breathing PT Goal Formulation: With patient Time For Goal Achievement: 06/07/23 Potential to Achieve Goals: Fair Progress towards PT goals: Progressing toward goals    Frequency    Min 1X/week      PT Plan      Co-evaluation              AM-PAC PT 6 Clicks Mobility   Outcome Measure  Help needed turning from your back to your side while in a flat bed without using bedrails?: None Help needed moving from lying on your back to sitting on the side of a flat bed without using bedrails?:  None Help needed moving to and from a bed to a chair (including a wheelchair)?: A Little Help needed standing up from a chair using your arms (e.g., wheelchair or bedside chair)?: A Little Help needed to walk in hospital room?: A Little Help needed climbing 3-5 steps with a railing? : A Lot 6 Click Score: 19    End of Session Equipment Utilized During Treatment: Oxygen Activity Tolerance: Patient tolerated treatment well Patient left: in bed (seated at EOB) Nurse Communication: Mobility status;Precautions PT Visit Diagnosis: Unsteadiness on feet (R26.81);Other abnormalities of gait and mobility (R26.89);Muscle weakness (generalized) (M62.81);History of falling (Z91.81)     Time: 8659-8642 PT Time Calculation (min) (ACUTE ONLY): 17 min  Charges:    $Gait Training: 8-22 mins PT General Charges $$ ACUTE PT VISIT: 1 Visit                     [name], PT, DPT, GCS [phone]    Chan Sheahan 06/01/2023, 2:35 PM

## 2023-06-01 NOTE — Plan of Care (Signed)
  Problem: Clinical Measurements: Goal: Will remain free from infection Outcome: Progressing   Problem: Clinical Measurements: Goal: Respiratory complications will improve Outcome: Progressing   Problem: Clinical Measurements: Goal: Cardiovascular complication will be avoided Outcome: Progressing   Problem: Activity: Goal: Risk for activity intolerance will decrease Outcome: Progressing   Problem: Elimination: Goal: Will not experience complications related to bowel motility Outcome: Progressing   Problem: Elimination: Goal: Will not experience complications related to urinary retention Outcome: Progressing   Problem: Pain Managment: Goal: General experience of comfort will improve and/or be controlled Outcome: Progressing   Problem: Safety: Goal: Ability to remain free from injury will improve Outcome: Progressing

## 2023-06-05 ENCOUNTER — Ambulatory Visit: Payer: Medicaid Other | Admitting: Pulmonary Disease

## 2023-06-05 ENCOUNTER — Telehealth: Payer: Self-pay | Admitting: Pulmonary Disease

## 2023-06-05 NOTE — Telephone Encounter (Signed)
PT was issued 7 medications while in the ER. When they went to pick them up they were too much $.   Mikael Spray has been approved for a 4.00 co-pay. Can we see if the other 6 can be discounted for him? They are all in bold below.  AVS from ER states:  START taking: arformoterol (BROVANA) bisacodyl (DULCOLAX) budesonide (PULMICORT) cyanocobalamin Start taking on: June 02, 2023 guaiFENesin [facility]) predniSONE (DELTASONE) Start taking on: June 02, 2023 revefenacin [facility])  CHANGE how you take: amLODipine (NORVASC) furosemide (LASIX) polyethylene glycol (MIRALAX / GLYCOLAX)  STOP taking: ALPRAZolam 0.25 MG tablet (XANAX) ipratropium-albuterol 0.5-2.5 (3) MG/3ML Soln (DUONEB) nicotine 21 mg/24hr patch (NICODERM CQ - dosed in mg/24 hours) propranolol 10 MG tablet (INDERAL) Review your updated medication list below.   Laure Kidney [facility]) [phone]

## 2023-06-05 NOTE — Telephone Encounter (Signed)
Can you confirm which medication you want him to take.

## 2023-06-05 NOTE — Telephone Encounter (Signed)
Prednisone should be.  I have nothing to do with the cyanocobalamin.  That is vitamin B12.  Dulcolax is over-the-counter and also not something we prescribe.  That should go through primary care but is also should not be too costly.  Mucinex is over-the-counter and he only needs to take as needed.  Notably the issues are with Brovana and Pulmicort.  Pulmicort is generic so again, should not be too costly.  Maybe we can have pharmacy team investigate for Brovana and Pulmicort.  Alternative for Rosalyn Gess would be Perforomist.

## 2023-06-06 ENCOUNTER — Telehealth: Payer: Self-pay | Admitting: Pulmonary Disease

## 2023-06-06 ENCOUNTER — Other Ambulatory Visit (HOSPITAL_COMMUNITY): Payer: Self-pay

## 2023-06-06 NOTE — Telephone Encounter (Signed)
I spoke with the patient. He said he called [employer] and they asked him bring in the machined and he told them he could not. And they did not offer to come out.  I spoke with Mitch at [employer] and he will have someone go out today and service the machine.  I have notified the patient.  Nothing further needed.

## 2023-06-06 NOTE — Telephone Encounter (Signed)
Pt states his O2 concentrater isn't working. I let him know he needs to call [employer]

## 2023-06-06 NOTE — Telephone Encounter (Signed)
Test claims show that Pulmicort is currently filled and too soon to refill. Patient should have access to this medication.

## 2023-06-07 ENCOUNTER — Other Ambulatory Visit (HOSPITAL_COMMUNITY): Payer: Self-pay

## 2023-06-07 NOTE — Telephone Encounter (Signed)
Christopher Mason is not covered- Pulmicort has been filled by a pharmacy and should be available for patient through insurance.

## 2023-06-07 NOTE — Telephone Encounter (Signed)
I have notified Dr. Jayme Cloud. She will discuss this with the patient at his appt tomorrow. Nothing further needed.

## 2023-06-08 ENCOUNTER — Ambulatory Visit: Payer: Medicaid Other | Admitting: Pulmonary Disease

## 2023-06-08 ENCOUNTER — Encounter: Payer: Self-pay | Admitting: Pulmonary Disease

## 2023-06-08 VITALS — BP 120/80 | HR 89 | Temp 97.3°F | Ht 67.0 in | Wt 247.6 lb

## 2023-06-08 DIAGNOSIS — J9612 Chronic respiratory failure with hypercapnia: Secondary | ICD-10-CM

## 2023-06-08 DIAGNOSIS — R4189 Other symptoms and signs involving cognitive functions and awareness: Secondary | ICD-10-CM | POA: Diagnosis not present

## 2023-06-08 DIAGNOSIS — J9611 Chronic respiratory failure with hypoxia: Secondary | ICD-10-CM | POA: Diagnosis not present

## 2023-06-08 DIAGNOSIS — G14 Postpolio syndrome: Secondary | ICD-10-CM

## 2023-06-08 DIAGNOSIS — J449 Chronic obstructive pulmonary disease, unspecified: Secondary | ICD-10-CM | POA: Diagnosis not present

## 2023-06-08 MED ORDER — IPRATROPIUM-ALBUTEROL 0.5-2.5 (3) MG/3ML IN SOLN: 3.0000 mL | Freq: Four times a day (QID) | RESPIRATORY_TRACT | 11 refills | Status: AC

## 2023-06-08 NOTE — Progress Notes (Signed)
Subjective:    Patient ID: Christopher Mason, male    DOB: 03-19-1961, 63 y.o.   MRN: [id number]  Patient Care Team: Sallyanne Kuster, NP as PCP - General (Nurse Practitioner) Salena Saner, MD as Consulting Physician (Pulmonary Disease)  Chief Complaint  Patient presents with   Follow-up    DOE. No wheezing. Cough with white sputum.    BACKGROUND/INTERVAL:This is a 63 year old former smoker (cigarettes/marijuana) with a history as noted below who presents today for follow-up on the issue of shortness of breath and COPD.  Last seen in clinic on 08 May 2023.  He was then admitted to [facility] due to acute on chronic respiratory failure with hypoxia and hypercapnia.  He was admitted on 21 May 2018 5 through 01 June 2023  HPI Discussed the use of AI scribe software for clinical note transcription with the patient, who gave verbal consent to proceed.  History of Present Illness   Christopher Mason is a 63 year old male with COPD, chronic respiratory failure, and post polio syndrome who presents for follow-up care.  He has experienced an improvement in his breathing and is currently adhering to his breathing treatments four times a day. He uses a nebulizer with medications, which are essential for managing his COPD. There are issues with insurance coverage for some of his nebulizer medications. He uses two types of nebulizers: one four times a day and the other twice a day. He is well-supplied with nebulizer medicine but acknowledges the need for both medications to work together effectively.  I am however concerned of this current regimen for it appears that he is using budesonide 4 times a day and as needed albuterol.  He was discharged home Brovana/budesonide/Yupelri however Brovana nor Perforomist are not covered by his insurance.  He was discharged from the hospital on noninvasive ventilation however, he states that he has not been able to use the equipment because it is making  a  "beeping noise" he has not called [employer] to check the machine.  He was offered skilled nursing facility but declined it.  Given his moderate cognitive impairment this would be beneficial for him as noted he declines.  His history of post polio syndrome affects his muscles and contributes to his respiratory issues. He recalls having polio as a child, which now impacts his breathing this is also aggravated by his severe COPD.  He has chronic respiratory failure with hypoxia and hypercapnia leading to shortness of breath.  He is currently maintained on 3 L/min nasal cannula O2.    DATA 01/03/2022 LDCT chest: Scarring in the right middle lobe and both lower lobes calcified granulomas.  Right lower lobe distortion/scarring cannot exclude lung lesion. 01/26/2022 PET/CT: No hypermetabolism in the chest (lung or mediastinum) resume yearly lung cancer screening. 03/28/2022 PFTs: FEV1 1.59 L or 50% predicted, FVC 2.97 L or 70% predicted, FEV1/FVC 54% of predicted, no bronchodilator response, patient did not perform diffusion capacity maneuvers well so these are not valid. Consistent with with moderate to severe obstructive defect. 05/25/2023 CT angio chest: Negative for acute PE, bilateral upper lobe and basilar infiltrates left greater than right trace left pleural effusion. 05/26/2023 echocardiogram: LVEF 55 to 60%, mild LVH, normal right ventricular function.  Review of Systems A 10 point review of systems was performed and it is as noted above otherwise negative.   Patient Active Problem List   Diagnosis Date Noted   Personal history of poliomyelitis 05/26/2023   Acute on chronic respiratory failure  with hypoxia (HCC) 05/22/2023   Iron deficiency anemia 09/20/2022   Fall 09/13/2022   Pneumonia of left lower lobe due to infectious organism 09/13/2022   Atelectasis of both lungs 09/08/2022   Lumbar radiculopathy 09/06/2022   Obesity (BMI 30-39.9) 09/05/2022   Constipation 09/02/2022   Acute  respiratory failure with hypoxia (HCC) 08/30/2022   Anasarca 08/30/2022   Lumbar radiculopathy, acute 08/17/2022   CAP (community acquired pneumonia) 08/16/2022   Left leg weakness 08/16/2022   Adenomatous polyp of colon 04/11/2022   Genetic testing 01/12/2020   History of colon polyps    Encounter for screening colonoscopy    Erectile dysfunction due to arterial insufficiency 12/09/2018   COPD exacerbation (HCC) 07/11/2018   Abdominal pain, left lower quadrant 10/31/2017   Acute cystitis without hematuria 10/31/2017   Fatigue 10/31/2017   Cigarette nicotine dependence without complication 10/31/2017   Acute right ankle pain 10/10/2017   Chronic pain of left knee 10/10/2017   Lower extremity edema 10/10/2017   Recurrent left inguinal hernia 10/10/2017   Schizophrenia (HCC) 07/03/2017   HLD (hyperlipidemia) 02/07/2015   COPD, group C, by GOLD 2017 classification (HCC) 02/07/2015   Hyponatremia 02/07/2015   AKI (acute kidney injury) (HCC) 11/20/2014   Hypothyroidism 09/17/2014   GERD (gastroesophageal reflux disease) 09/17/2014   Tobacco use disorder 09/17/2014   Asthma 09/16/2014   HTN (hypertension) 09/16/2014   Tardive dyskinesia 09/16/2014    Social History   Tobacco Use   Smoking status: Former    Current packs/day: 0.00    Average packs/day: 1 pack/day for 50.0 years (50.0 ttl pk-yrs)    Types: Cigarettes    Start date: 07/1972    Quit date: 07/2022    Years since quitting: 0.8   Smokeless tobacco: Never  Substance Use Topics   Alcohol use: No    Allergies  Allergen Reactions   Aspirin Nausea And Vomiting and Swelling    Current Meds  Medication Sig   albuterol (VENTOLIN HFA) 108 (90 Base) MCG/ACT inhaler Inhale 2 puffs into the lungs every 6 (six) hours as needed for wheezing or shortness of breath.   amitriptyline (ELAVIL) 25 MG tablet Take 1 tablet (25 mg total) by mouth at bedtime.   amLODipine (NORVASC) 10 MG tablet Take 0.5 tablets (5 mg total) by  mouth daily.   atorvastatin (LIPITOR) 40 MG tablet Take 1 tablet (40 mg total) by mouth daily.   bisacodyl (DULCOLAX) 5 MG EC tablet Take 2 tablets (10 mg total) by mouth at bedtime.   budesonide (PULMICORT) 0.5 MG/2ML nebulizer solution Take 2 mLs (0.5 mg total) by nebulization 2 (two) times daily.   cyanocobalamin 1000 MCG tablet Take 1 tablet (1,000 mcg total) by mouth daily.   docusate sodium (COLACE) 50 MG capsule Take 1 capsule (50 mg total) by mouth daily.   furosemide (LASIX) 40 MG tablet Take 1 tablet (40 mg total) by mouth 2 (two) times daily with breakfast and lunch.   gabapentin (NEURONTIN) 100 MG capsule Take 1 capsule (100 mg total) by mouth 3 (three) times daily.   guaiFENesin (MUCINEX) 600 MG 12 hr tablet Take 1 tablet (600 mg total) by mouth 2 (two) times daily.   INVEGA SUSTENNA 234 MG/1.5ML injection Inject into the muscle. Every 4 weeks.   ipratropium-albuterol (DUONEB) 0.5-2.5 (3) MG/3ML SOLN Take 3 mLs by nebulization 4 (four) times daily.   levothyroxine (SYNTHROID) 150 MCG tablet Take 1 tablet (150 mcg total) by mouth daily before breakfast. On empty stomach, wait 30  minutes before eating or taking other medications.   Multiple Vitamin (MULTIVITAMIN WITH MINERALS) TABS tablet Take 1 tablet by mouth daily.   paliperidone (INVEGA) 3 MG 24 hr tablet Take 3 mg by mouth at bedtime.   pantoprazole (PROTONIX) 40 MG tablet Take 1 tablet (40 mg total) by mouth daily.   polyethylene glycol (MIRALAX / GLYCOLAX) 17 g packet Take 17 g by mouth daily as needed for mild constipation.   predniSONE (DELTASONE) 10 MG tablet Take 3 tablets (30 mg total) by mouth daily with breakfast for 1 day, THEN 2 tablets (20 mg total) daily with breakfast for 3 days, THEN 1 tablet (10 mg total) daily with breakfast for 3 days.   thiamine (VITAMIN B-1) 100 MG tablet Take 1 tablet (100 mg total) by mouth daily.   traZODone (DESYREL) 100 MG tablet Take 100 mg by mouth at bedtime.   venlafaxine (EFFEXOR) 100  MG tablet Take 100 mg by mouth 2 (two) times daily.   Vitamin D, Ergocalciferol, (DRISDOL) 1.25 MG (50000 UNIT) CAPS capsule Take 1 capsule (50,000 Units total) by mouth every 7 (seven) days.    Immunization History  Administered Date(s) Administered   Influenza Inj Mdck Quad Pf 01/07/2018, 03/02/2020, 03/01/2022   Influenza, Mdck, Trivalent,PF 6+ MOS(egg free) 02/20/2023   Influenza,inj,Quad PF,6+ Mos 01/02/2019   Moderna Sars-Covid-2 Vaccination 08/26/2019, 09/17/2019, 02/17/2020   Tdap 08/16/2022        Objective:     BP 120/80 (BP Location: Right Arm, Cuff Size: Large)   Pulse 89   Temp (!) 97.3 F (36.3 C)   Ht 5\' 7"  (1.702 m)   Wt 247 lb 9.6 oz (112.3 kg)   SpO2 91%   BMI 38.78 kg/m   SpO2: 91 % O2 Device: Nasal cannula O2 Flow Rate (L/min): 3 L/min O2 Type: Continuous O2  GENERAL: Obese gentleman, no acute distress.  Sent in transport chair, comfortable nasal cannula O2.  No conversational dyspnea.  HEAD: Normocephalic, atraumatic.  EYES: Pupils equal, round, reactive to light.  No scleral icterus.  MOUTH: Macroglossia, lingual dyskinesia. NECK: Supple. No thyromegaly. Trachea midline. No JVD.  No adenopathy. PULMONARY: Good air entry bilaterally.  Scattered wheezes and rhonchi throughout. CARDIOVASCULAR: S1 and S2. Regular rate and rhythm.  ABDOMEN: Significant truncal obesity. MUSCULOSKELETAL: Left arm limited motion due to prior scar tissue (burn), no clubbing, no edema. Decreased muscle mass on the left lower extremity due to prior poliomyelitis  NEUROLOGIC: Patient exhibits lingual tardive dyskinesia, dysarthria due to the same. SKIN: Intact,warm,dry. PSYCH: Tangential thinking.  Cooperative.     Assessment & Plan:     ICD-10-CM   1. Stage 3 severe COPD by GOLD classification (HCC)  J44.9     2. Chronic respiratory failure with hypoxia and hypercapnia (HCC)  J96.11    J96.12     3. Post-polio syndrome  G14     4. Moderate cognitive impairment   R41.89       Meds ordered this encounter  Medications   ipratropium-albuterol (DUONEB) 0.5-2.5 (3) MG/3ML SOLN    Sig: Take 3 mLs by nebulization 4 (four) times daily.    Dispense:  360 mL    Refill:  11   Discussion:    Chronic Obstructive Pulmonary Disease (COPD) COPD with chronic respiratory failure. Reports improved breathing and adherence to treatments. Insurance issues with nebulizers noted, leading to a simplified regimen. Currently using a malfunctioning machine at night. Explained the need for combined nebulizer use for optimal effect. - Prescribe Duoneb  four times a day - Prescribe Pulmicort (budesonide) twice a day - Provided instructions for proper medication regimen - Send prescriptions to patient's pharmacy - Arrange for an appointment to check the machine - Follow-up in two months  Post-Polio Syndrome Post-polio syndrome contributing to chronic respiratory failure. Polio-affected muscle function leads to increased carbon dioxide levels and shortness of breath. Explained the impact on respiratory muscles. - Arrange for an appointment to check the machine to ensure proper function during sleep  Follow-up - Follow-up in two months.     Advised if symptoms do not improve or worsen, to please contact office for sooner follow up or seek emergency care.    I spent 42 minutes of dedicated to the care of this patient on the date of this encounter to include pre-visit review of records, face-to-face time with the patient discussing conditions above, post visit ordering of testing, clinical documentation with the electronic health record, making appropriate referrals as documented, and communicating necessary findings to members of the patients care team.     C. Danice Goltz, MD Advanced Bronchoscopy P[facility]    *This note was generated using voice recognition software/Dragon and/or AI transcription program.  Despite best efforts to proofread,  errors can occur which can change the meaning. Any transcriptional errors that result from this process are unintentional and may not be fully corrected at the time of dictation.

## 2023-06-08 NOTE — Patient Instructions (Addendum)
Your breathing medications will be as follows: DuoNeb (ipratropium/albuterol) 4 times a day. Budesonide (Pulmicort) twice a day after a DuoNeb treatment  Continue using your oxygen at 4 lpm  We will have [employer] come and check your machine that you use with sleep.  We will see you in 2 months.

## 2023-06-11 ENCOUNTER — Inpatient Hospital Stay: Payer: Medicaid Other | Admitting: Nurse Practitioner

## 2023-06-15 ENCOUNTER — Telehealth: Payer: Self-pay

## 2023-06-19 ENCOUNTER — Telehealth: Payer: Self-pay | Admitting: Pulmonary Disease

## 2023-06-19 ENCOUNTER — Other Ambulatory Visit: Payer: Self-pay | Admitting: Pulmonary Disease

## 2023-06-19 MED ORDER — METHYLPREDNISOLONE 4 MG PO TBPK: ORAL_TABLET | ORAL | 0 refills | Status: DC

## 2023-06-19 NOTE — Telephone Encounter (Signed)
 On vent at home since discharge from hospital on 2/7. His mask had a leak in it and he was not wearing it. She went out today to fix the mask. He was doing a neb treatment when she got there and his lips were a little blue. O2 was in the mid 80's on 6L. She put him on the vent and turned him up to 7L. O2 stayed the same. Turned PEP from 5 to 7-8. She said he is not swollen and his ankles are good. He is not in respiratory distress. On 8L when she left and vent. Comfortable. Sats between 88%-89%. She said If he talks it goes down. His O2 is very slow to come back up. She feels like he just needs another round of Prednisone. She does not feel like he needs to go to the ED because he is not in distress. He is cutting up and feels fine.

## 2023-06-19 NOTE — Telephone Encounter (Signed)
 I notified the patient. He insists he feels fine. I told him he needs to go to the ED if he starts having any respiratory distress.  I did notify Mand as well.   Nothing further needed.

## 2023-06-19 NOTE — Telephone Encounter (Signed)
 Respiratory therapist from [facility] thinks patient needs more prednisone. She has I'm on extra oxygen. He was just on a 7 day round but she believes more is needed to keep him out of the hospital. She can be reached at [phone]

## 2023-06-19 NOTE — Telephone Encounter (Signed)
 I notified the patient. He does not want to go to the ED. He said he does not have a ride.   Is there anything else he can do?

## 2023-06-19 NOTE — Telephone Encounter (Signed)
 Is he using his nebs?  I can send a prescription to his pharmacy HOWEVER I still recommend the ED because I do not think that he is going to respond to the steroid alone.

## 2023-06-19 NOTE — Telephone Encounter (Signed)
He needs to be evaluated in the ED

## 2023-06-21 ENCOUNTER — Telehealth: Payer: Self-pay | Admitting: Pulmonary Disease

## 2023-06-21 NOTE — Telephone Encounter (Signed)
 Pharmacy states that rx for prednisone was sent to wrong pharmacy needs to be sent to them

## 2023-06-21 NOTE — Telephone Encounter (Signed)
 Lm on voicemail to let them know that we sent prescription on 06/19/2023.

## 2023-06-21 NOTE — Telephone Encounter (Signed)
 Prescription was sent to the [facility]. Patient uses [place] location. Gave verbal prescription to pharmacist. Nothing further needed.

## 2023-06-25 ENCOUNTER — Inpatient Hospital Stay: Payer: Medicaid Other | Admitting: Nurse Practitioner

## 2023-06-27 ENCOUNTER — Ambulatory Visit: Payer: Medicaid Other | Admitting: Pulmonary Disease

## 2023-06-28 ENCOUNTER — Ambulatory Visit: Admitting: Nurse Practitioner

## 2023-06-28 ENCOUNTER — Encounter: Payer: Self-pay | Admitting: Nurse Practitioner

## 2023-06-28 VITALS — BP 138/88 | HR 86 | Temp 98.4°F | Resp 16 | Ht 67.0 in | Wt 260.6 lb

## 2023-06-28 DIAGNOSIS — E039 Hypothyroidism, unspecified: Secondary | ICD-10-CM

## 2023-06-28 DIAGNOSIS — I10 Essential (primary) hypertension: Secondary | ICD-10-CM

## 2023-06-28 DIAGNOSIS — J9612 Chronic respiratory failure with hypercapnia: Secondary | ICD-10-CM

## 2023-06-28 DIAGNOSIS — J449 Chronic obstructive pulmonary disease, unspecified: Secondary | ICD-10-CM | POA: Diagnosis not present

## 2023-06-28 DIAGNOSIS — Z09 Encounter for follow-up examination after completed treatment for conditions other than malignant neoplasm: Secondary | ICD-10-CM

## 2023-06-28 DIAGNOSIS — J9611 Chronic respiratory failure with hypoxia: Secondary | ICD-10-CM | POA: Diagnosis not present

## 2023-06-28 DIAGNOSIS — R4189 Other symptoms and signs involving cognitive functions and awareness: Secondary | ICD-10-CM
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
## 2023-06-28 NOTE — Telephone Encounter (Signed)
 Pt had appt today.

## 2023-07-11 ENCOUNTER — Emergency Department

## 2023-07-11 ENCOUNTER — Other Ambulatory Visit: Payer: Self-pay

## 2023-07-11 ENCOUNTER — Inpatient Hospital Stay
Admission: EM | Admit: 2023-07-11 | Discharge: 2023-07-16 | DRG: 291 | Disposition: A | Discharge status: Home / Self Care | Attending: Internal Medicine | Admitting: Internal Medicine

## 2023-07-11 DIAGNOSIS — F319 Bipolar disorder, unspecified: Secondary | ICD-10-CM | POA: Diagnosis present

## 2023-07-11 DIAGNOSIS — Q382 Macroglossia: Secondary | ICD-10-CM

## 2023-07-11 DIAGNOSIS — F17210 Nicotine dependence, cigarettes, uncomplicated: Secondary | ICD-10-CM | POA: Diagnosis present

## 2023-07-11 DIAGNOSIS — J9601 Acute respiratory failure with hypoxia: Principal | ICD-10-CM

## 2023-07-11 DIAGNOSIS — J449 Chronic obstructive pulmonary disease, unspecified: Secondary | ICD-10-CM | POA: Diagnosis present

## 2023-07-11 DIAGNOSIS — I509 Heart failure, unspecified: Secondary | ICD-10-CM | POA: Diagnosis not present

## 2023-07-11 DIAGNOSIS — Z760 Encounter for issue of repeat prescription: Secondary | ICD-10-CM

## 2023-07-11 DIAGNOSIS — Z79890 Hormone replacement therapy (postmenopausal): Secondary | ICD-10-CM

## 2023-07-11 DIAGNOSIS — Z91148 Patient's other noncompliance with medication regimen for other reason: Secondary | ICD-10-CM

## 2023-07-11 DIAGNOSIS — Z833 Family history of diabetes mellitus: Secondary | ICD-10-CM

## 2023-07-11 DIAGNOSIS — G2401 Drug induced subacute dyskinesia: Secondary | ICD-10-CM | POA: Diagnosis present

## 2023-07-11 DIAGNOSIS — E66812 Obesity, class 2: Secondary | ICD-10-CM | POA: Diagnosis present

## 2023-07-11 DIAGNOSIS — R601 Generalized edema: Secondary | ICD-10-CM | POA: Diagnosis present

## 2023-07-11 DIAGNOSIS — E8729 Other acidosis: Secondary | ICD-10-CM | POA: Diagnosis present

## 2023-07-11 DIAGNOSIS — M5416 Radiculopathy, lumbar region: Secondary | ICD-10-CM

## 2023-07-11 DIAGNOSIS — I110 Hypertensive heart disease with heart failure: Secondary | ICD-10-CM | POA: Diagnosis present

## 2023-07-11 DIAGNOSIS — Z91199 Patient's noncompliance with other medical treatment and regimen due to unspecified reason: Secondary | ICD-10-CM

## 2023-07-11 DIAGNOSIS — Z716 Tobacco abuse counseling: Secondary | ICD-10-CM

## 2023-07-11 DIAGNOSIS — E039 Hypothyroidism, unspecified: Secondary | ICD-10-CM | POA: Diagnosis present

## 2023-07-11 DIAGNOSIS — Z9981 Dependence on supplemental oxygen: Secondary | ICD-10-CM

## 2023-07-11 DIAGNOSIS — J986 Disorders of diaphragm: Secondary | ICD-10-CM | POA: Diagnosis not present

## 2023-07-11 DIAGNOSIS — Z6836 Body mass index (BMI) 36.0-36.9, adult: Secondary | ICD-10-CM

## 2023-07-11 DIAGNOSIS — R739 Hyperglycemia, unspecified: Secondary | ICD-10-CM | POA: Diagnosis not present

## 2023-07-11 DIAGNOSIS — J9621 Acute and chronic respiratory failure with hypoxia: Secondary | ICD-10-CM | POA: Diagnosis present

## 2023-07-11 DIAGNOSIS — T380X5A Adverse effect of glucocorticoids and synthetic analogues, initial encounter: Secondary | ICD-10-CM | POA: Diagnosis not present

## 2023-07-11 DIAGNOSIS — K219 Gastro-esophageal reflux disease without esophagitis: Secondary | ICD-10-CM | POA: Diagnosis present

## 2023-07-11 DIAGNOSIS — Z818 Family history of other mental and behavioral disorders: Secondary | ICD-10-CM

## 2023-07-11 DIAGNOSIS — Z8612 Personal history of poliomyelitis: Secondary | ICD-10-CM

## 2023-07-11 DIAGNOSIS — F1290 Cannabis use, unspecified, uncomplicated: Secondary | ICD-10-CM | POA: Diagnosis present

## 2023-07-11 DIAGNOSIS — Z860100 Personal history of colon polyps, unspecified: Secondary | ICD-10-CM

## 2023-07-11 DIAGNOSIS — I5033 Acute on chronic diastolic (congestive) heart failure: Secondary | ICD-10-CM | POA: Diagnosis present

## 2023-07-11 DIAGNOSIS — E785 Hyperlipidemia, unspecified: Secondary | ICD-10-CM | POA: Diagnosis present

## 2023-07-11 DIAGNOSIS — F209 Schizophrenia, unspecified: Secondary | ICD-10-CM | POA: Diagnosis present

## 2023-07-11 DIAGNOSIS — J441 Chronic obstructive pulmonary disease with (acute) exacerbation: Secondary | ICD-10-CM | POA: Diagnosis not present

## 2023-07-11 DIAGNOSIS — J9622 Acute and chronic respiratory failure with hypercapnia: Secondary | ICD-10-CM | POA: Diagnosis present

## 2023-07-11 DIAGNOSIS — Z79899 Other long term (current) drug therapy: Secondary | ICD-10-CM

## 2023-07-11 DIAGNOSIS — Z8249 Family history of ischemic heart disease and other diseases of the circulatory system: Secondary | ICD-10-CM

## 2023-07-11 DIAGNOSIS — E669 Obesity, unspecified: Secondary | ICD-10-CM

## 2023-07-11 DIAGNOSIS — Z886 Allergy status to analgesic agent status: Secondary | ICD-10-CM

## 2023-07-11 DIAGNOSIS — I5030 Unspecified diastolic (congestive) heart failure: Secondary | ICD-10-CM | POA: Diagnosis not present

## 2023-07-11 DIAGNOSIS — Z1152 Encounter for screening for COVID-19: Secondary | ICD-10-CM

## 2023-07-11 DIAGNOSIS — J9811 Atelectasis: Secondary | ICD-10-CM | POA: Diagnosis present

## 2023-07-11 DIAGNOSIS — J9611 Chronic respiratory failure with hypoxia: Secondary | ICD-10-CM | POA: Diagnosis present

## 2023-07-11 DIAGNOSIS — Z7951 Long term (current) use of inhaled steroids: Secondary | ICD-10-CM

## 2023-07-11 LAB — COMPREHENSIVE METABOLIC PANEL
ALT: 20 U/L (ref 0–44)
AST: 18 U/L (ref 15–41)
Albumin: 3.6 g/dL (ref 3.5–5.0)
Alkaline Phosphatase: 83 U/L (ref 38–126)
Anion gap: 8 (ref 5–15)
BUN: 15 mg/dL (ref 8–23)
CO2: 31 mmol/L (ref 22–32)
Calcium: 9.4 mg/dL (ref 8.9–10.3)
Chloride: 100 mmol/L (ref 98–111)
Creatinine, Ser: 0.94 mg/dL (ref 0.61–1.24)
GFR, Estimated: >60 mL/min (ref >60)
Glucose, Bld: 187 mg/dL — ABNORMAL HIGH (ref 70–99)
Potassium: 3.6 mmol/L (ref 3.5–5.1)
Sodium: 139 mmol/L (ref 135–145)
Total Bilirubin: 0.7 mg/dL (ref 0.0–1.2)
Total Protein: 7.3 g/dL (ref 6.5–8.1)

## 2023-07-11 LAB — BLOOD GAS, VENOUS
Acid-Base Excess: 7.4 mmol/L — ABNORMAL HIGH (ref 0.0–2.0)
Bicarbonate: 35.9 mmol/L — ABNORMAL HIGH (ref 20.0–28.0)
O2 Saturation: 63.1 %
Patient temperature: 37
pCO2, Ven: 68 mmHg — ABNORMAL HIGH (ref 44–60)
pH, Ven: 7.33 (ref 7.25–7.43)
pO2, Ven: 39 mmHg (ref 32–45)

## 2023-07-11 LAB — CBC WITH DIFFERENTIAL/PLATELET
Abs Immature Granulocytes: 0.08 10*3/uL — ABNORMAL HIGH (ref 0.00–0.07)
Basophils Absolute: 0.1 10*3/uL (ref 0.0–0.1)
Basophils Relative: 1 %
Eosinophils Absolute: 0.2 10*3/uL (ref 0.0–0.5)
Eosinophils Relative: 2 %
HCT: 38.3 % — ABNORMAL LOW (ref 39.0–52.0)
Hemoglobin: 11.4 g/dL — ABNORMAL LOW (ref 13.0–17.0)
Immature Granulocytes: 1 %
Lymphocytes Relative: 15 %
Lymphs Abs: 1.2 10*3/uL (ref 0.7–4.0)
MCH: 29.4 pg (ref 26.0–34.0)
MCHC: 29.8 g/dL — ABNORMAL LOW (ref 30.0–36.0)
MCV: 98.7 fL (ref 80.0–100.0)
Monocytes Absolute: 0.8 10*3/uL (ref 0.1–1.0)
Monocytes Relative: 10 %
Neutro Abs: 6.1 10*3/uL (ref 1.7–7.7)
Neutrophils Relative %: 71 %
Platelets: 194 10*3/uL (ref 150–400)
RBC: 3.88 MIL/uL — ABNORMAL LOW (ref 4.22–5.81)
RDW: 15 % (ref 11.5–15.5)
WBC: 8.5 10*3/uL (ref 4.0–10.5)
nRBC: 0 % (ref 0.0–0.2)

## 2023-07-11 LAB — TROPONIN I (HIGH SENSITIVITY)
Troponin I (High Sensitivity): 13 ng/L (ref <18)
Troponin I (High Sensitivity): 14 ng/L (ref <18)

## 2023-07-11 LAB — BRAIN NATRIURETIC PEPTIDE: B Natriuretic Peptide: 37.9 pg/mL (ref 0.0–100.0)

## 2023-07-11 MED ORDER — NICOTINE 21 MG/24HR TD PT24
21.0000 mg | MEDICATED_PATCH | Freq: Every day | TRANSDERMAL | Status: DC
  Filled 2023-07-11 (×6): qty 1

## 2023-07-11 MED ORDER — ARFORMOTEROL TARTRATE 15 MCG/2ML IN NEBU
15.0000 ug | INHALATION_SOLUTION | Freq: Two times a day (BID) | RESPIRATORY_TRACT | Status: DC
  Administered 2023-07-11 – 2023-07-16 (×9): 15 ug via RESPIRATORY_TRACT
  Filled 2023-07-11 (×11): qty 2

## 2023-07-11 MED ORDER — LEVOTHYROXINE SODIUM 50 MCG PO TABS
150.0000 ug | ORAL_TABLET | Freq: Every day | ORAL | Status: DC
  Administered 2023-07-12 – 2023-07-16 (×5): 150 ug via ORAL
  Filled 2023-07-11 (×4): qty 1
  Filled 2023-07-11: qty 3

## 2023-07-11 MED ORDER — ALBUTEROL SULFATE (2.5 MG/3ML) 0.083% IN NEBU
2.5000 mg | INHALATION_SOLUTION | Freq: Four times a day (QID) | RESPIRATORY_TRACT | Status: DC | PRN
  Administered 2023-07-13: 2.5 mg via RESPIRATORY_TRACT
  Filled 2023-07-11: qty 3

## 2023-07-11 MED ORDER — SODIUM CHLORIDE 0.9% FLUSH: 3.0000 mL | INTRAVENOUS | Status: DC | PRN

## 2023-07-11 MED ORDER — ONDANSETRON HCL 4 MG/2ML IJ SOLN: 4.0000 mg | Freq: Four times a day (QID) | INTRAMUSCULAR | Status: DC | PRN

## 2023-07-11 MED ORDER — METHYLPREDNISOLONE SODIUM SUCC 40 MG IJ SOLR
40.0000 mg | Freq: Two times a day (BID) | INTRAMUSCULAR | Status: DC
  Administered 2023-07-11 – 2023-07-12 (×3): 40 mg via INTRAVENOUS
  Filled 2023-07-11 (×3): qty 1

## 2023-07-11 MED ORDER — PALIPERIDONE ER 3 MG PO TB24
3.0000 mg | ORAL_TABLET | Freq: Every day | ORAL | Status: DC
  Administered 2023-07-11 – 2023-07-15 (×5): 3 mg via ORAL
  Filled 2023-07-11 (×5): qty 1

## 2023-07-11 MED ORDER — GUAIFENESIN ER 600 MG PO TB12
600.0000 mg | ORAL_TABLET | Freq: Two times a day (BID) | ORAL | Status: DC
  Administered 2023-07-11 – 2023-07-16 (×10): 600 mg via ORAL
  Filled 2023-07-11 (×10): qty 1

## 2023-07-11 MED ORDER — SODIUM CHLORIDE 0.9% FLUSH
3.0000 mL | Freq: Two times a day (BID) | INTRAVENOUS | Status: DC
Start: 2023-07-11 — End: 2023-07-16
  Administered 2023-07-12 – 2023-07-16 (×10): 3 mL via INTRAVENOUS

## 2023-07-11 MED ORDER — AMITRIPTYLINE HCL 25 MG PO TABS
25.0000 mg | ORAL_TABLET | Freq: Every day | ORAL | Status: DC
  Administered 2023-07-11 – 2023-07-15 (×5): 25 mg via ORAL
  Filled 2023-07-11 (×6): qty 1

## 2023-07-11 MED ORDER — BUDESONIDE 0.5 MG/2ML IN SUSP
0.5000 mg | Freq: Two times a day (BID) | RESPIRATORY_TRACT | Status: DC
  Administered 2023-07-11 – 2023-07-16 (×10): 0.5 mg via RESPIRATORY_TRACT
  Filled 2023-07-11 (×10): qty 2

## 2023-07-11 MED ORDER — BISACODYL 5 MG PO TBEC
10.0000 mg | DELAYED_RELEASE_TABLET | Freq: Every day | ORAL | Status: DC
  Administered 2023-07-11 – 2023-07-13 (×3): 10 mg via ORAL
  Filled 2023-07-11 (×5): qty 2

## 2023-07-11 MED ORDER — FUROSEMIDE 10 MG/ML IJ SOLN
40.0000 mg | Freq: Two times a day (BID) | INTRAMUSCULAR | Status: DC
  Administered 2023-07-11 – 2023-07-16 (×10): 40 mg via INTRAVENOUS
  Filled 2023-07-11 (×10): qty 4

## 2023-07-11 MED ORDER — ATORVASTATIN CALCIUM 20 MG PO TABS
40.0000 mg | ORAL_TABLET | Freq: Every day | ORAL | Status: DC
Start: 2023-07-12 — End: 2023-07-12

## 2023-07-11 MED ORDER — ENOXAPARIN SODIUM 60 MG/0.6ML IJ SOSY
0.5000 mg/kg | PREFILLED_SYRINGE | INTRAMUSCULAR | Status: DC
  Administered 2023-07-12 – 2023-07-15 (×4): 60 mg via SUBCUTANEOUS
  Filled 2023-07-11 (×4): qty 0.6

## 2023-07-11 MED ORDER — VENLAFAXINE HCL 25 MG PO TABS
100.0000 mg | ORAL_TABLET | Freq: Two times a day (BID) | ORAL | Status: DC
  Administered 2023-07-12 – 2023-07-16 (×9): 100 mg via ORAL
  Filled 2023-07-11 (×9): qty 1

## 2023-07-11 MED ORDER — IPRATROPIUM-ALBUTEROL 0.5-2.5 (3) MG/3ML IN SOLN
3.0000 mL | Freq: Four times a day (QID) | RESPIRATORY_TRACT | Status: DC
  Administered 2023-07-11 – 2023-07-13 (×7): 3 mL via RESPIRATORY_TRACT
  Filled 2023-07-11 (×6): qty 3

## 2023-07-11 MED ORDER — PANTOPRAZOLE SODIUM 40 MG PO TBEC
40.0000 mg | DELAYED_RELEASE_TABLET | Freq: Every day | ORAL | Status: DC
  Administered 2023-07-12 – 2023-07-16 (×5): 40 mg via ORAL
  Filled 2023-07-11 (×5): qty 1

## 2023-07-11 MED ORDER — GABAPENTIN 100 MG PO CAPS
100.0000 mg | ORAL_CAPSULE | Freq: Three times a day (TID) | ORAL | Status: DC
  Administered 2023-07-11 – 2023-07-16 (×14): 100 mg via ORAL
  Filled 2023-07-11 (×14): qty 1

## 2023-07-11 MED ORDER — FUROSEMIDE 10 MG/ML IJ SOLN
60.0000 mg | Freq: Once | INTRAMUSCULAR | Status: AC
  Administered 2023-07-11: 60 mg via INTRAVENOUS
  Filled 2023-07-11: qty 8

## 2023-07-11 MED ORDER — DOCUSATE SODIUM 50 MG PO CAPS
50.0000 mg | ORAL_CAPSULE | Freq: Every day | ORAL | Status: DC
Start: 2023-07-11 — End: 2023-07-12
  Filled 2023-07-11 (×2): qty 1

## 2023-07-11 MED ORDER — TRAZODONE HCL 100 MG PO TABS
100.0000 mg | ORAL_TABLET | Freq: Every day | ORAL | Status: DC
  Administered 2023-07-11 – 2023-07-15 (×5): 100 mg via ORAL
  Filled 2023-07-11 (×5): qty 1

## 2023-07-11 MED ORDER — SODIUM CHLORIDE 0.9 % IV SOLN: 250.0000 mL | INTRAVENOUS | Status: AC | PRN

## 2023-07-11 MED ORDER — ACETAMINOPHEN 325 MG PO TABS
650.0000 mg | ORAL_TABLET | ORAL | Status: DC | PRN
  Administered 2023-07-12: 650 mg via ORAL
  Filled 2023-07-11: qty 2

## 2023-07-11 MED ORDER — POLYETHYLENE GLYCOL 3350 17 G PO PACK: 17.0000 g | PACK | Freq: Every day | ORAL | Status: DC | PRN

## 2023-07-11 NOTE — ED Notes (Signed)
 Per dr. Chipper Herb pt can remain off Bipap and go on PRN, pt is on 8L [place] currently

## 2023-07-11 NOTE — ED Triage Notes (Signed)
 [facility] reports pt coming from home c/o sob. Upon EMS arrival pt 80% on 8L [place]. Pt wear oxygen at home PRN. EMS placed pt on CPAP with pulse ox improving. EMS gave two due nebs and 125mg  solumedrol IV. History of COPD per EMS.

## 2023-07-11 NOTE — TOC Progression Note (Signed)
 Transition of Care [facility]) - Progression Note    Patient Details  Name: Christopher Mason MRN: [id number] Date of Birth: 1960/10/22  [facility] CM/SW Contact  Colin Broach, LCSW Phone Number: 07/11/2023, 8:26 PM  Clinical Narrative:     CSW met with patient at bedside.  Demographic and insurance information verified.  Patient's PCP is Sallyanne Kuster, NP. He has no concerns about paying for his medications.  Patient lives alone and states that he wants to go home because he has a dog that he needs to take care of.  He reports that he has the following DME:  walker, Bipap, and shower chair.  He reports that he uses 8L of O2 at home.  At discharge, his friend Beau Fanny will transport him.  CM to continue to follow for any discharge needs that may arise.       Expected Discharge Plan and Services                                               Social Determinants of Health (SDOH) Interventions SDOH Screenings   Food Insecurity: No Food Insecurity (05/25/2023)  Housing: Low Risk  (05/25/2023)  Transportation Needs: No Transportation Needs (05/25/2023)  Utilities: Not At Risk (05/25/2023)  Alcohol Screen: Low Risk  (09/06/2021)  Depression (PHQ2-9): Low Risk  (09/06/2021)  Tobacco Use: Medium Risk (07/11/2023)    Readmission Risk Interventions    07/11/2023    8:25 PM  Readmission Risk Prevention Plan  Transportation Screening Complete  PCP or Specialist Appt within 3-5 Days Complete  HRI or Home Care Consult Complete  Social Work Consult for Recovery Care Planning/Counseling Complete  Palliative Care Screening Not Applicable  Medication Review Oceanographer) Complete

## 2023-07-11 NOTE — ED Provider Notes (Signed)
 [facility] Provider Note    Event Date/Time   First MD Initiated Contact with Patient 07/11/23 1231     (approximate)   History   Shortness of Breath   HPI  Christopher Mason is a 63 y.o. male to ER for evaluation of worsening shortness of breath over past few days as well as distention and lower extremity swelling.  Denies any history of CHF.  EMS found the patient to be hypoxic on 8 L nasal cannula.  Patient was given IM epinephrine, nebulizer treatments as well as Solu-Medrol.     Physical Exam   Triage Vital Signs: ED Triage Vitals  Encounter Vitals Group     BP --      Systolic BP Percentile --      Diastolic BP Percentile --      Pulse --      Resp --      Temp --      Temp src --      SpO2 07/11/23 1233 98 %     Weight --      Height --      Head Circumference --      Peak Flow --      Pain Score 07/11/23 1234 0     Pain Loc --      Pain Education --      Exclude from Growth Chart --     Most recent vital signs: Vitals:   07/11/23 1240 07/11/23 1330  BP: 119/65 109/72  Pulse: 88 78  Resp: (!) 21 (!) 23  Temp: 97.9 F (36.6 C)   SpO2: 99% 95%     Constitutional: Alert, mod resp distress Eyes: Conjunctivae are normal.  Head: Atraumatic. Nose: No congestion/rhinnorhea. Mouth/Throat: Mucous membranes are moist.   Neck: Painless ROM.  Cardiovascular:   Good peripheral circulation. Respiratory: Diminished breath sounds throughout. Gastrointestinal: Soft and nontender.  Musculoskeletal:  no deformity 3+ pitting edema to the mid abdomen. Neurologic:  MAE spontaneously. No gross focal neurologic deficits are appreciated.  Skin:  Skin is warm, dry and intact. No rash noted.    ED Results / Procedures / Treatments   Labs (all labs ordered are listed, but only abnormal results are displayed) Labs Reviewed  CBC WITH DIFFERENTIAL/PLATELET - Abnormal; Notable for the following components:      Result Value   RBC 3.88 (*)     Hemoglobin 11.4 (*)    HCT 38.3 (*)    MCHC 29.8 (*)    Abs Immature Granulocytes 0.08 (*)    All other components within normal limits  COMPREHENSIVE METABOLIC PANEL - Abnormal; Notable for the following components:   Glucose, Bld 187 (*)    All other components within normal limits  BLOOD GAS, VENOUS - Abnormal; Notable for the following components:   pCO2, Ven 68 (*)    Bicarbonate 35.9 (*)    Acid-Base Excess 7.4 (*)    All other components within normal limits  BRAIN NATRIURETIC PEPTIDE  TROPONIN I (HIGH SENSITIVITY)  TROPONIN I (HIGH SENSITIVITY)     EKG  ED ECG REPORT I, Willy Eddy, the attending physician, personally viewed and interpreted this ECG.   Date: 07/11/2023  EKG Time: 12:42  Rate: 85  Rhythm: sinus  Axis: normal  Intervals: rbbb  ST&T Change: nonspecific st abn    RADIOLOGY Please see ED Course for my review and interpretation.  I personally reviewed all radiographic images ordered to evaluate for the above  acute complaints and reviewed radiology reports and findings.  These findings were personally discussed with the patient.  Please see medical record for radiology report.    PROCEDURES:  Critical Care performed: Yes, see critical care procedure note(s)  .Critical Care  Performed by: Willy Eddy, MD Authorized by: Willy Eddy, MD   Critical care provider statement:    Critical care time (minutes):  35   Critical care was necessary to treat or prevent imminent or life-threatening deterioration of the following conditions:  Respiratory failure   Critical care was time spent personally by me on the following activities:  Ordering and performing treatments and interventions, ordering and review of laboratory studies, ordering and review of radiographic studies, pulse oximetry, re-evaluation of patient's condition, review of old charts, obtaining history from patient or surrogate, examination of patient, evaluation of patient's  response to treatment, discussions with primary provider, discussions with consultants and development of treatment plan with patient or surrogate    MEDICATIONS ORDERED IN ED: Medications  furosemide (LASIX) injection 60 mg (has no administration in time range)     IMPRESSION / MDM / ASSESSMENT AND PLAN / ED COURSE  I reviewed the triage vital signs and the nursing notes.                              Differential diagnosis includes, but is not limited to, Asthma, copd, CHF, pna, ptx, malignancy, Pe, anemia  Patient presenting to the ER for evaluation of symptoms as described above.  Based on symptoms, risk factors and considered above differential, this presenting complaint could reflect a potentially life-threatening illness therefore the patient will be placed on continuous pulse oximetry and telemetry for monitoring.  Laboratory evaluation will be sent to evaluate for the above complaints.      Clinical Course as of 07/11/23 1409  Wed Jul 11, 2023  1310 Chest x-ray on my review and interpretation appears more consistent with CHF. [PR]  1400 Patient reassessed.  Does appear to be clinically improving on BiPAP.  May have component of COPD but also clinically suspect some CHF given his diffuse edema will give Lasix.  Will continue with nebulizers as he did also does have some hypercapnia.  Will consult hospitalist for admission. [PR]    Clinical Course User Index [PR] Willy Eddy, MD     FINAL CLINICAL IMPRESSION(S) / ED DIAGNOSES   Final diagnoses:  Acute respiratory failure with hypoxia and hypercapnia (HCC)     Rx / DC Orders   ED Discharge Orders     None        Note:  This document was prepared using Dragon voice recognition software and may include unintentional dictation errors.    Willy Eddy, MD 07/11/23 1409

## 2023-07-11 NOTE — ED Notes (Signed)
 Pt removed from Bipap to eat and placed on 8L 
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
## 2023-07-11 NOTE — ED Notes (Signed)
 Pt removed from Bipap to eat per provider, placed on normal 8L [place] he wears at home.

## 2023-07-12 ENCOUNTER — Other Ambulatory Visit: Payer: Self-pay | Admitting: Nurse Practitioner

## 2023-07-12 DIAGNOSIS — J449 Chronic obstructive pulmonary disease, unspecified: Secondary | ICD-10-CM | POA: Diagnosis not present

## 2023-07-12 DIAGNOSIS — J9621 Acute and chronic respiratory failure with hypoxia: Secondary | ICD-10-CM

## 2023-07-12 DIAGNOSIS — I5030 Unspecified diastolic (congestive) heart failure: Secondary | ICD-10-CM | POA: Diagnosis not present

## 2023-07-12 DIAGNOSIS — J986 Disorders of diaphragm: Secondary | ICD-10-CM

## 2023-07-12 DIAGNOSIS — J9622 Acute and chronic respiratory failure with hypercapnia: Secondary | ICD-10-CM | POA: Diagnosis not present

## 2023-07-12 DIAGNOSIS — J9811 Atelectasis: Secondary | ICD-10-CM

## 2023-07-12 DIAGNOSIS — I509 Heart failure, unspecified: Secondary | ICD-10-CM

## 2023-07-12 DIAGNOSIS — Z79899 Other long term (current) drug therapy: Secondary | ICD-10-CM

## 2023-07-12 LAB — BLOOD GAS, VENOUS
Acid-Base Excess: 9.3 mmol/L — ABNORMAL HIGH (ref 0.0–2.0)
Bicarbonate: 35.7 mmol/L — ABNORMAL HIGH (ref 20.0–28.0)
O2 Saturation: 91.7 %
Patient temperature: 37
pCO2, Ven: 55 mmHg (ref 44–60)
pH, Ven: 7.42 (ref 7.25–7.43)
pO2, Ven: 58 mmHg — ABNORMAL HIGH (ref 32–45)

## 2023-07-12 LAB — BASIC METABOLIC PANEL
Anion gap: 12 (ref 5–15)
BUN: 22 mg/dL (ref 8–23)
CO2: 29 mmol/L (ref 22–32)
Calcium: 9.6 mg/dL (ref 8.9–10.3)
Chloride: 97 mmol/L — ABNORMAL LOW (ref 98–111)
Creatinine, Ser: 0.91 mg/dL (ref 0.61–1.24)
GFR, Estimated: >60 mL/min (ref >60)
Glucose, Bld: 197 mg/dL — ABNORMAL HIGH (ref 70–99)
Potassium: 4 mmol/L (ref 3.5–5.1)
Sodium: 138 mmol/L (ref 135–145)

## 2023-07-12 LAB — RESP PANEL BY RT-PCR (RSV, FLU A&B, COVID)  RVPGX2
Influenza A by PCR: NEGATIVE
Influenza B by PCR: NEGATIVE
Resp Syncytial Virus by PCR: NEGATIVE
SARS Coronavirus 2 by RT PCR: NEGATIVE

## 2023-07-12 LAB — TSH: TSH: 2.269 u[IU]/mL (ref 0.350–4.500)

## 2023-07-12 LAB — T4, FREE: Free T4: 0.82 ng/dL (ref 0.61–1.12)

## 2023-07-12 MED ORDER — DOCUSATE SODIUM 100 MG PO CAPS
100.0000 mg | ORAL_CAPSULE | Freq: Every day | ORAL | Status: DC
Start: 2023-07-12 — End: 2023-07-16
  Administered 2023-07-13 – 2023-07-16 (×4): 100 mg via ORAL
  Filled 2023-07-12 (×5): qty 1

## 2023-07-12 NOTE — TOC Progression Note (Signed)
 Transition of Care [facility]) - Progression Note    Patient Details  Name: Braian Tijerina MRN: [id number] Date of Birth: 10-Apr-1961  [facility]) CM/SW Contact  Margarito Liner, LCSW Phone Number: 07/12/2023, 11:55 AM  Clinical Narrative:   Per chart review, patient has been using 8 L of oxygen at home. CSW contacted his oxygen company, [employer], who stated their orders are for 2 L and he has a 5 L concentrator so he would not be able to use 8 L with that. MD and RN are aware.  Expected Discharge Plan and Services                                               Social Determinants of Health (SDOH) Interventions SDOH Screenings   Food Insecurity: No Food Insecurity (05/25/2023)  Housing: Low Risk  (05/25/2023)  Transportation Needs: No Transportation Needs (05/25/2023)  Utilities: Not At Risk (05/25/2023)  Alcohol Screen: Low Risk  (09/06/2021)  Depression (PHQ2-9): Low Risk  (09/06/2021)  Tobacco Use: Medium Risk (07/11/2023)    Readmission Risk Interventions    07/11/2023    8:25 PM  Readmission Risk Prevention Plan  Transportation Screening Complete  PCP or Specialist Appt within 3-5 Days Complete  HRI or Home Care Consult Complete  Social Work Consult for Recovery Care Planning/Counseling Complete  Palliative Care Screening Not Applicable  Medication Review Oceanographer) Complete

## 2023-07-12 NOTE — Progress Notes (Signed)
  PROGRESS NOTE    Christopher Mason  ION:[id number] DOB: 10-13-1960 DOA: 07/11/2023 PCP: Sallyanne Kuster, NP  [id number]  LOS: 1 day   Brief hospital course:   Assessment & Plan: Shaheim Mahar is a 63 y.o. male with medical history significant of COPD Gold stage III, HTN, chronic HFpEF, bipolar disorder, HTN, hypothyroidism, cigarette smoking, presented with worsening of cough wheezing shortness of breath and leg swelling.    Acute on chronic hypoxic respiratory failure Chronic hypercapnic respiratory failure --pt reported using 8L O2 at baseline, however, pt may have self-titrated up the O2 requirement. --started tx for both CHF and COPD exacerbation on presentation, however, pt didn't have wheezing the next day, so more likely CHF. --was put on BiPAP on presentation, weaned to 8L this morning. --Continue supplemental O2 to keep sats between 88-92%, wean as tolerated   Acute on chronic HFpEF decompensation -Echocardiogram done recently, will not repeat --cont IV lasix 40 BID  COPD, ruled out exacerbation --d/c steroid   Hypothyroidism -Continue Synthroid 150 mcg daily   Schizophrenia Bipolar disorder -Mentation at baseline, no acute concerns -Continue home psych medications   Class II obesity -BMI= 36 -Calorie control recommended   Cigarette smoking -Cessation education performed at bedside by admitting physician -Nicotine patch   DVT prophylaxis: Lovenox SQ Code Status: Full code  Family Communication:  Level of care: Progressive Dispo:   The patient is from: home Anticipated d/c is to: home Anticipated d/c date is: 2-3 days   Subjective and Interval History:  Pt reported cough and dyspnea.   Objective: Vitals:   07/12/23 1715 07/12/23 1743 07/12/23 1830 07/12/23 2014  BP: 134/74 90/65 138/74 138/83  Pulse: 86 79  86  Resp: (!) 21   (!) 24  Temp:  98.2 F (36.8 C)  98.6 F (37 C)  TempSrc:    Oral  SpO2: 94% 94%  95%    Intake/Output  Summary (Last 24 hours) at 07/12/2023 2042 Last data filed at 07/12/2023 0159 Gross per 24 hour  Intake 3 ml  Output 1800 ml  Net -1797 ml   There were no vitals filed for this visit.  Examination:   Constitutional: NAD, AAOx3 HEENT: conjunctivae and lids normal, EOMI CV: No cyanosis.   RESP: normal respiratory effort, on 8L, no wheezes Extremities: No effusions, edema in BLE SKIN: warm, dry Neuro: II - XII grossly intact.   Psych: Normal mood and affect.  Appropriate judgement and reason   Data Reviewed: I have personally reviewed labs and imaging studies  Time spent: 50 minutes  Darlin Priestly, MD [facility] If 7PM-7AM, please contact night-coverage 07/12/2023, 8:42 PM
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
## 2023-07-13 ENCOUNTER — Inpatient Hospital Stay

## 2023-07-13 DIAGNOSIS — I5033 Acute on chronic diastolic (congestive) heart failure: Secondary | ICD-10-CM

## 2023-07-13 DIAGNOSIS — J9622 Acute and chronic respiratory failure with hypercapnia: Secondary | ICD-10-CM | POA: Diagnosis not present

## 2023-07-13 DIAGNOSIS — J449 Chronic obstructive pulmonary disease, unspecified: Secondary | ICD-10-CM | POA: Diagnosis not present

## 2023-07-13 DIAGNOSIS — J9621 Acute and chronic respiratory failure with hypoxia: Secondary | ICD-10-CM | POA: Diagnosis not present

## 2023-07-13 LAB — CBC
HCT: 35 % — ABNORMAL LOW (ref 39.0–52.0)
Hemoglobin: 10.7 g/dL — ABNORMAL LOW (ref 13.0–17.0)
MCH: 29 pg (ref 26.0–34.0)
MCHC: 30.6 g/dL (ref 30.0–36.0)
MCV: 94.9 fL (ref 80.0–100.0)
Platelets: 212 10*3/uL (ref 150–400)
RBC: 3.69 MIL/uL — ABNORMAL LOW (ref 4.22–5.81)
RDW: 15 % (ref 11.5–15.5)
WBC: 12.5 10*3/uL — ABNORMAL HIGH (ref 4.0–10.5)
nRBC: 0.2 % (ref 0.0–0.2)

## 2023-07-13 LAB — MAGNESIUM: Magnesium: 2.3 mg/dL (ref 1.7–2.4)

## 2023-07-13 LAB — BASIC METABOLIC PANEL
Anion gap: 9 (ref 5–15)
BUN: 28 mg/dL — ABNORMAL HIGH (ref 8–23)
CO2: 32 mmol/L (ref 22–32)
Calcium: 9.3 mg/dL (ref 8.9–10.3)
Chloride: 99 mmol/L (ref 98–111)
Creatinine, Ser: 0.95 mg/dL (ref 0.61–1.24)
GFR, Estimated: >60 mL/min (ref >60)
Glucose, Bld: 209 mg/dL — ABNORMAL HIGH (ref 70–99)
Potassium: 4.4 mmol/L (ref 3.5–5.1)
Sodium: 140 mmol/L (ref 135–145)

## 2023-07-13 LAB — GLUCOSE, CAPILLARY: Glucose-Capillary: 142 mg/dL — ABNORMAL HIGH (ref 70–99)

## 2023-07-13 MED ORDER — REVEFENACIN 175 MCG/3ML IN SOLN
175.0000 ug | Freq: Every day | RESPIRATORY_TRACT | Status: DC
  Administered 2023-07-13 – 2023-07-16 (×4): 175 ug via RESPIRATORY_TRACT
  Filled 2023-07-13 (×4): qty 3

## 2023-07-13 NOTE — Progress Notes (Signed)
 Heart Failure Navigator Progress Note  Assessed for Heart & Vascular TOC clinic readiness.  Patient does not meet criteria due to current admission for Acute Respiratory failure with Hypoxia & Hypercapnia.   Navigator will sign off at this time.  Roxy Horseman, RN, BSN [facility] Heart Failure Navigator Secure Chat Only

## 2023-07-13 NOTE — Plan of Care (Signed)
  Problem: Clinical Measurements: Goal: Ability to maintain clinical measurements within normal limits will improve Outcome: Progressing   Problem: Nutrition: Goal: Adequate nutrition will be maintained Outcome: Progressing   Problem: Coping: Goal: Level of anxiety will decrease Outcome: Progressing   

## 2023-07-13 NOTE — Progress Notes (Signed)
  PROGRESS NOTE    Christopher Mason  ZOX:[id number] DOB: [birth year] DOA: 07/11/2023 PCP: Sallyanne Kuster, NP  [id number]  LOS: 2 days   Brief hospital course:   Assessment & Plan: Christopher Mason is a 63 y.o. 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    Acute on chronic hypoxic respiratory failure Chronic hypercapnic respiratory failure on 4L O2 at baseline --pt reported using 8L O2 at baseline, however, pt may have self-titrated up the O2 requirement.  Per pulm, pt's baseline is 4L.  On BiPAP nightly at home. --started tx for both CHF and COPD exacerbation on presentation, however, pt didn't have wheezing the next day, so more likely CHF. --was put on BiPAP on presentation, weaned to 8L in the ED.  This morning, down to 2L [place]. --pulm consulted with Dr. Jayme Cloud  --Continue supplemental O2 to keep sats between 88-92%  Acute on chronic HFpEF decompensation -Echocardiogram done recently, will not repeat --cont IV lasix 40 BID  COPD, ruled out exacerbation --d/c'ed steroid --Continue COPD regimen Brovana plus Pulmicort --add Yupelri by pulm  Medication noncompliance --Chronic issue with this patient   Hypothyroidism -Continue Synthroid 150 mcg daily   Schizophrenia Bipolar disorder -Mentation at baseline, no acute concerns -Continue home psych medications   Class II obesity -BMI= 36 -Calorie control recommended   Cigarette smoking -Cessation education performed at bedside by admitting physician -Nicotine patch  Hyperglycemia likely 2/2 steroid --BG check with morning labs   DVT prophylaxis: Lovenox SQ Code Status: Full code  Family Communication:  Level of care: Progressive Dispo:   The patient is from: home Anticipated d/c is to: home Anticipated d/c date is: 1-2  days   Subjective and Interval History:  Pt reported breathing better, and increased urine output.   Objective: Vitals:   07/13/23 1627 07/13/23 1732 07/13/23 1950 07/13/23 2004  BP: (!) 160/91  (!) 146/85   Pulse: 84  89   Resp: (!) 24     Temp:   98.2 F (36.8 C)   TempSrc:   Oral   SpO2: 96% 90% 90% 90%  Weight:        Intake/Output Summary (Last 24 hours) at 07/13/2023 2050 Last data filed at 07/13/2023 1900 Gross per 24 hour  Intake 480 ml  Output 1875 ml  Net -1395 ml   Filed Weights   07/13/23 0500  Weight: 124.7 kg    Examination:   Constitutional: NAD, AAOx3 HEENT: conjunctivae and lids normal, EOMI CV: No cyanosis.   RESP: coarse breath sounds, on 2L Neuro: II - XII grossly intact.   Psych: Normal mood and affect.  Appropriate judgement and reason   Data Reviewed: I have personally reviewed labs and imaging studies  Time spent: 50 minutes  Darlin Priestly, MD [facility] If 7PM-7AM, please contact night-coverage 07/13/2023, 8:50 PM
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
## 2023-07-13 NOTE — Plan of Care (Signed)

## 2023-07-14 DIAGNOSIS — J9622 Acute and chronic respiratory failure with hypercapnia: Secondary | ICD-10-CM | POA: Diagnosis not present

## 2023-07-14 DIAGNOSIS — J9621 Acute and chronic respiratory failure with hypoxia: Secondary | ICD-10-CM | POA: Diagnosis not present

## 2023-07-14 DIAGNOSIS — J449 Chronic obstructive pulmonary disease, unspecified: Secondary | ICD-10-CM | POA: Diagnosis not present

## 2023-07-14 DIAGNOSIS — I5033 Acute on chronic diastolic (congestive) heart failure: Secondary | ICD-10-CM | POA: Diagnosis not present

## 2023-07-14 LAB — CBC
HCT: 32.9 % — ABNORMAL LOW (ref 39.0–52.0)
Hemoglobin: 10.1 g/dL — ABNORMAL LOW (ref 13.0–17.0)
MCH: 29.3 pg (ref 26.0–34.0)
MCHC: 30.7 g/dL (ref 30.0–36.0)
MCV: 95.4 fL (ref 80.0–100.0)
Platelets: 185 10*3/uL (ref 150–400)
RBC: 3.45 MIL/uL — ABNORMAL LOW (ref 4.22–5.81)
RDW: 15.4 % (ref 11.5–15.5)
WBC: 8.5 10*3/uL (ref 4.0–10.5)
nRBC: 0 % (ref 0.0–0.2)

## 2023-07-14 LAB — BASIC METABOLIC PANEL
Anion gap: 9 (ref 5–15)
BUN: 29 mg/dL — ABNORMAL HIGH (ref 8–23)
CO2: 33 mmol/L — ABNORMAL HIGH (ref 22–32)
Calcium: 9 mg/dL (ref 8.9–10.3)
Chloride: 99 mmol/L (ref 98–111)
Creatinine, Ser: 0.94 mg/dL (ref 0.61–1.24)
GFR, Estimated: >60 mL/min (ref >60)
Glucose, Bld: 118 mg/dL — ABNORMAL HIGH (ref 70–99)
Potassium: 3.8 mmol/L (ref 3.5–5.1)
Sodium: 141 mmol/L (ref 135–145)

## 2023-07-14 LAB — MAGNESIUM: Magnesium: 2.4 mg/dL (ref 1.7–2.4)

## 2023-07-14 NOTE — Plan of Care (Signed)
  Problem: Clinical Measurements: Goal: Will remain free from infection Outcome: Progressing Goal: Respiratory complications will improve Outcome: Progressing Goal: Cardiovascular complication will be avoided Outcome: Progressing   Problem: Activity: Goal: Risk for activity intolerance will decrease Outcome: Progressing   Problem: Nutrition: Goal: Adequate nutrition will be maintained Outcome: Progressing   Problem: Coping: Goal: Level of anxiety will decrease Outcome: Progressing   Problem: Elimination: Goal: Will not experience complications related to urinary retention Outcome: Progressing

## 2023-07-14 NOTE — Progress Notes (Signed)
 PROGRESS NOTE    Jeffery Bachmeier  ZOX:[id number] DOB: [birth year] DOA: 07/11/2023 PCP: Sallyanne Kuster, NP  [id number]  LOS: 3 days   Brief hospital course:  Christopher Mason is a 63 y.o. 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   3/22: Hemodynamically stable and now back to baseline oxygen requirement.  Still having 1+ lower extremity edema.  Assessment & Plan:   Acute on chronic hypoxic respiratory failure Chronic hypercapnic respiratory failure on 4L O2 at baseline --pt reported using 8L O2 at baseline, however, pt may have self-titrated up the O2 requirement.  Per pulm, pt's baseline is 4L.  On BiPAP nightly at home. --started tx for both CHF and COPD exacerbation on presentation, however, pt didn't have wheezing the next day, so more likely CHF. --was put on BiPAP on presentation, weaned to 8L in the ED.  This morning, down to 2L [place]. --pulm consulted with Dr. Jayme Cloud  --Continue supplemental O2 to keep sats between 88-92%  Acute on chronic HFpEF decompensation -Echocardiogram done recently, will not repeat --cont IV lasix 40 BID-renal function stable.  COPD, ruled out exacerbation --d/c'ed steroid --Continue COPD regimen Brovana plus Pulmicort --add Yupelri by pulm  Medication noncompliance --Chronic issue with this patient   Hypothyroidism -Continue Synthroid 150 mcg daily   Schizophrenia Bipolar disorder -Mentation at baseline, no acute concerns -Continue home psych medications   Class II obesity -BMI= 36 -Calorie control recommended   Cigarette smoking -Cessation education performed at bedside by admitting physician -Nicotine patch  Hyperglycemia likely 2/2 steroid --BG check with morning labs   DVT prophylaxis: Lovenox SQ Code Status: Full code  Family  Communication:  Level of care: Progressive Dispo:   The patient is from: home Anticipated d/c is to: home Anticipated d/c date is: 1 days   Subjective and Interval History:  Patient was resting comfortably with BiPAP when seen today.  No chest pain or shortness of breath.   Objective: Vitals:   07/14/23 0715 07/14/23 0900 07/14/23 1045 07/14/23 1250  BP: (!) 130/96  (!) 176/96   Pulse: 64 66 71   Resp: 20 20 19    Temp: 97.9 F (36.6 C)  98.6 F (37 C)   TempSrc:      SpO2: 93% 94% 98%   Weight:      Height:    5\' 7"  (1.702 m)    Intake/Output Summary (Last 24 hours) at 07/14/2023 1410 Last data filed at 07/14/2023 0254 Gross per 24 hour  Intake 680 ml  Output 2025 ml  Net -1345 ml   Filed Weights   07/13/23 0500 07/14/23 0500  Weight: 124.7 kg 121.8 kg    Examination:   General.  Obese gentleman, in no acute distress. Pulmonary.  Lungs clear bilaterally, normal respiratory effort. CV.  Regular rate and rhythm, no JVD, rub or murmur. Abdomen.  Soft, nontender, nondistended, BS positive. CNS.  Alert and oriented .  No focal neurologic deficit. Extremities.  1+ LE edema, no cyanosis, pulses intact and symmetrical. Psychiatry.  Judgment and insight appears normal.    Data Reviewed: I have personally reviewed labs and imaging studies  Time spent: 50 minutes  This record has been created using Conservation officer, historic buildings. Errors have been sought and corrected,but may not always be located. Such creation errors do not reflect on the standard of care.  Arnetha Courser, MD [facility] If 7PM-7AM, please contact night-coverage 07/14/2023, 2:10 PM
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
## 2023-07-14 NOTE — Plan of Care (Signed)

## 2023-07-15 DIAGNOSIS — J9621 Acute and chronic respiratory failure with hypoxia: Secondary | ICD-10-CM | POA: Diagnosis not present

## 2023-07-15 DIAGNOSIS — J9622 Acute and chronic respiratory failure with hypercapnia: Secondary | ICD-10-CM | POA: Diagnosis not present

## 2023-07-15 LAB — BASIC METABOLIC PANEL
Anion gap: 5 (ref 5–15)
BUN: 28 mg/dL — ABNORMAL HIGH (ref 8–23)
CO2: 35 mmol/L — ABNORMAL HIGH (ref 22–32)
Calcium: 8.6 mg/dL — ABNORMAL LOW (ref 8.9–10.3)
Chloride: 98 mmol/L (ref 98–111)
Creatinine, Ser: 0.91 mg/dL (ref 0.61–1.24)
GFR, Estimated: >60 mL/min (ref >60)
Glucose, Bld: 105 mg/dL — ABNORMAL HIGH (ref 70–99)
Potassium: 4.1 mmol/L (ref 3.5–5.1)
Sodium: 138 mmol/L (ref 135–145)

## 2023-07-15 LAB — CBC
HCT: 33.7 % — ABNORMAL LOW (ref 39.0–52.0)
Hemoglobin: 10.5 g/dL — ABNORMAL LOW (ref 13.0–17.0)
MCH: 29.5 pg (ref 26.0–34.0)
MCHC: 31.2 g/dL (ref 30.0–36.0)
MCV: 94.7 fL (ref 80.0–100.0)
Platelets: 177 10*3/uL (ref 150–400)
RBC: 3.56 MIL/uL — ABNORMAL LOW (ref 4.22–5.81)
RDW: 15.1 % (ref 11.5–15.5)
WBC: 7.4 10*3/uL (ref 4.0–10.5)
nRBC: 0 % (ref 0.0–0.2)

## 2023-07-15 LAB — MAGNESIUM: Magnesium: 2.4 mg/dL (ref 1.7–2.4)

## 2023-07-15 NOTE — Plan of Care (Signed)

## 2023-07-15 NOTE — Plan of Care (Signed)
  Problem: Safety: Goal: Ability to remain free from injury will improve Outcome: Progressing   Problem: Pain Managment: Goal: General experience of comfort will improve and/or be controlled Outcome: Progressing   Problem: Nutrition: Goal: Adequate nutrition will be maintained Outcome: Progressing   Problem: Clinical Measurements: Goal: Ability to maintain clinical measurements within normal limits will improve Outcome: Progressing Goal: Will remain free from infection Outcome: Progressing Goal: Respiratory complications will improve Outcome: Progressing Goal: Cardiovascular complication will be avoided Outcome: Progressing   Problem: Education: Goal: Knowledge of General Education information will improve Description: Including pain rating scale, medication(s)/side effects and non-pharmacologic comfort measures Outcome: Progressing

## 2023-07-15 NOTE — Evaluation (Signed)
 Physical Therapy Evaluation Patient Details Name: Christopher Mason MRN: [id number] DOB: 18-Mar-1961 Today's Date: 07/15/2023  History of Present Illness  Pt is a 63 y.o. male presenting to hospital 05/22/23 with c/o SOB and fatigue.  Pt admitted with acute on chronic respiratory failure with hypoxia, COPD exacerbation, LE edema.  PMH includes h/o COPD on 4 L home O2 (has been self selecting 8L for a few months now), htn, schizophrenia, h/o spinal sx, bipolar disorder, tardive dyskinesia.  Clinical Impression  Pt initially hesitant to do a lot citing pain (chronic back) and breathing difficulty, but did ultimately agree to some mobility/ambulation.  He was on 4L O2 on arrival with SpO2 staying in the 88-90 range at rest and decreasing a few points with activity (down to 84% with ambulation - MD and RN made aware).  Ultimately he moved relatively well regarding bed mobility, static standing balance and limited in-room ambulation with walker but does not have much tolerance for any prolonged effort.  Pt will benefit from continued PT to address functional limitations.        If plan is discharge home, recommend the following: A little help with walking and/or transfers;A lot of help with bathing/dressing/bathroom;Assistance with cooking/housework;Assist for transportation   Can travel by private vehicle        Equipment Recommendations None recommended by PT  Recommendations for Other Services       Functional Status Assessment Patient has had a recent decline in their functional status and demonstrates the ability to make significant improvements in function in a reasonable and predictable amount of time.     Precautions / Restrictions Precautions Precautions: Fall Restrictions Weight Bearing Restrictions Per Provider Order: No      Mobility  Bed Mobility Overal bed mobility: Modified Independent             General bed mobility comments: utilizing UEs on rails, able to rise to  sitting EOB w/o phyiscal assist    Transfers Overall transfer level: Needs assistance Equipment used: Rolling walker (2 wheels) Transfers: Sit to/from Stand Sit to Stand: From elevated surface, Min assist           General transfer comment: Pt unable to rise from standard height bed, but with 2-3" of bed elevation and cuing for technique he was able to rise to standing w/o physical assist    Ambulation/Gait Ambulation/Gait assistance: Supervision Gait Distance (Feet): 30 Feet Assistive device: Rolling walker (2 wheels)         General Gait Details: On 4L O2, SpO2 89% at beginning of the effort, dropping to 84% on return to bed.  He was able to maintain balance and slow but consistent cadence with minimal cuing, despite fatigue he showed good relatively confidence.  Stairs            Wheelchair Mobility     Tilt Bed    Modified Rankin (Stroke Patients Only)       Balance Overall balance assessment: Modified Independent                                           Pertinent Vitals/Pain Pain Assessment Pain Assessment: 0-10 Pain Score: 7  Pain Location: reports chronic back pain Pain Intervention(s): Limited activity within patient's tolerance    Home Living Family/patient expects to be discharged to:: Private residence Living Arrangements: Alone Available Help at Discharge: Available PRN/intermittently;Personal  care attendant (aide 3hr/day QD) Type of Home: Apartment Home Access: Level entry       Home Layout: One level Home Equipment: Rollator (4 wheels);Grab bars - tub/shower      Prior Function Prior Level of Function : Needs assist (has aide)             Mobility Comments: Pt reports that he can be up ad lib in the home but is only out of the home for MD appointments ADLs Comments: aides does grocery shopping/errands, gives him a bath, wipes after BM, does his laundry, etc     Extremity/Trunk Assessment   Upper  Extremity Assessment Upper Extremity Assessment: Overall WFL for tasks assessed    Lower Extremity Assessment Lower Extremity Assessment: Generalized weakness (R LE pain)       Communication   Communication Factors Affecting Communication:  (baseline slurred speech)    Cognition Arousal: Alert Behavior During Therapy: WFL for tasks assessed/performed   PT - Cognitive impairments: No apparent impairments                         Following commands: Intact       Cueing Cueing Techniques: Verbal cues, Gestural cues     General Comments General comments (skin integrity, edema, etc.): Pt on 4L O2 t/o session, only briefly showing SpO2 >90, generally in the 87-89% range at rest.  decreases to mid 80s with activity.    Exercises     Assessment/Plan    PT Assessment Patient needs continued PT services  PT Problem List Decreased activity tolerance;Decreased safety awareness;Cardiopulmonary status limiting activity;Pain       PT Treatment Interventions DME instruction;Gait training;Functional mobility training;Therapeutic activities;Therapeutic exercise;Balance training;Neuromuscular re-education;Patient/family education    PT Goals (Current goals can be found in the Care Plan section)  Acute Rehab PT Goals Patient Stated Goal: Pt hoping to get breathing better and go home "in a few days" PT Goal Formulation: With patient Time For Goal Achievement: 07/28/23 Potential to Achieve Goals: Good    Frequency Min 2X/week     Co-evaluation               AM-PAC PT "6 Clicks" Mobility  Outcome Measure Help needed turning from your back to your side while in a flat bed without using bedrails?: None Help needed moving from lying on your back to sitting on the side of a flat bed without using bedrails?: A Little Help needed moving to and from a bed to a chair (including a wheelchair)?: A Little Help needed standing up from a chair using your arms (e.g., wheelchair  or bedside chair)?: A Little Help needed to walk in hospital room?: A Little Help needed climbing 3-5 steps with a railing? : A Lot 6 Click Score: 18    End of Session Equipment Utilized During Treatment: Gait belt;Oxygen (4L) Activity Tolerance: Patient tolerated treatment well;Patient limited by fatigue Patient left: with bed alarm set;with call bell/phone within reach (refuses recliner 2/2 "I've had 8 back surgeries")   PT Visit Diagnosis: Muscle weakness (generalized) (M62.81);Difficulty in walking, not elsewhere classified (R26.2)    Time: 1610-9604 PT Time Calculation (min) (ACUTE ONLY): 21 min   Charges:   PT Evaluation $PT Eval Low Complexity: 1 Low PT Treatments $Therapeutic Activity: 8-22 mins PT General Charges $$ ACUTE PT VISIT: 1 Visit         Malachi Pro, DPT 07/15/2023, 12:15 PM

## 2023-07-15 NOTE — Plan of Care (Signed)
 Problem: Clinical Measurements: Goal: Ability to maintain clinical measurements within normal limits will improve Outcome: Progressing   Problem: Clinical Measurements: Goal: Respiratory complications will improve Outcome: Progressing   Problem: Activity: Goal: Risk for activity intolerance will decrease Outcome: Progressing   Problem: Safety: Goal: Ability to remain free from injury will improve Outcome: Progressing   Problem: Pain Managment: Goal: General experience of comfort will improve and/or be controlled Outcome: Progressing

## 2023-07-15 NOTE — Progress Notes (Addendum)
 PROGRESS NOTE    Christopher Mason  ZOX:[id number] DOB: 01-19-1961 DOA: 07/11/2023 PCP: Sallyanne Kuster, NP  [id number]  LOS: 4 days   Brief hospital course:  Christopher Mason is a 63 y.o. 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   3/22: Hemodynamically stable and now back to baseline oxygen requirement.  Still having 1+ lower extremity edema.  3/23: Patient become hypoxic with increased shortness of breath with minor exertion like walking around.  Likely need more oxygen than baseline.  PT is recommending home health.  Assessment & Plan:   Acute on chronic hypoxic respiratory failure Chronic hypercapnic respiratory failure on 4L O2 at baseline --pt reported using 8L O2 at baseline, however, pt may have self-titrated up the O2 requirement.  Per pulm, pt's baseline is 4L.  On BiPAP nightly at home. --started tx for both CHF and COPD exacerbation on presentation, however, pt didn't have wheezing the next day, so more likely CHF. --was put on BiPAP on presentation, currently on 5 L --pulm consulted with Dr. Jayme Cloud  --Continue supplemental O2 to keep sats between 88-92%  Acute on chronic HFpEF decompensation -Echocardiogram done recently, will not repeat --cont IV lasix 40 BID-renal function stable.  COPD, ruled out exacerbation --d/c'ed steroid --Continue COPD regimen Brovana plus Pulmicort --add Yupelri by pulm -Pulmonary is recommending discharging on DuoNeb 4 times daily and Pulmicort 0.25 mg twice daily as insurance will not cover current regimen in hospital.  Medication noncompliance --Chronic issue with this patient   Hypothyroidism -Continue Synthroid 150 mcg daily   Schizophrenia Bipolar disorder -Mentation at baseline, no acute concerns -Continue home psych medications    Class II obesity -BMI= 36 -Calorie control recommended   Cigarette smoking -Cessation education performed at bedside by admitting physician -Nicotine patch  Hyperglycemia likely 2/2 steroid -- Improved after discontinuing steroid.   DVT prophylaxis: Lovenox SQ Code Status: Full code  Family Communication:  Level of care: Progressive Dispo:   The patient is from: home Anticipated d/c is to: home with home health Anticipated d/c date is: 1 days   Subjective and Interval History:  Patient was feeling short of breath and becoming hypoxic in mid 80s after working with PT .  No chest pain.  Objective: Vitals:   07/15/23 0625 07/15/23 0753 07/15/23 0823 07/15/23 1113  BP:  (!) 152/88  (!) 157/84  Pulse:  64  77  Resp:  20  20  Temp:  98.7 F (37.1 C)  98.7 F (37.1 C)  TempSrc:      SpO2:  93% 95% 95%  Weight: 125.1 kg     Height:        Intake/Output Summary (Last 24 hours) at 07/15/2023 1446 Last data filed at 07/15/2023 1023 Gross per 24 hour  Intake 560 ml  Output 1375 ml  Net -815 ml   Filed Weights   07/13/23 0500 07/14/23 0500 07/15/23 0625  Weight: 124.7 kg 121.8 kg 125.1 kg    Examination:   General.  Morbidly obese gentleman, to in no acute distress. Pulmonary.  Lungs clear bilaterally, normal respiratory effort. CV.  Regular rate and rhythm, no JVD, rub or murmur. Abdomen.  Soft, nontender, nondistended, BS positive. CNS.  Alert and oriented .  No focal neurologic deficit. Extremities.  Trace LE  edema, no cyanosis, pulses intact and symmetrical.  Data Reviewed: I have personally reviewed labs and imaging studies  Time spent: 44 minutes  This record has been created using Conservation officer, historic buildings. Errors have been sought and corrected,but may not always be located. Such creation errors do not reflect on the standard of care.   Arnetha Courser, MD [facility] If 7PM-7AM, please contact night-coverage 07/15/2023, 2:46 PM

## 2023-07-16 ENCOUNTER — Telehealth (HOSPITAL_COMMUNITY): Payer: Self-pay | Admitting: Pharmacy Technician

## 2023-07-16 ENCOUNTER — Other Ambulatory Visit (HOSPITAL_COMMUNITY): Payer: Self-pay

## 2023-07-16 ENCOUNTER — Other Ambulatory Visit: Payer: Self-pay

## 2023-07-16 DIAGNOSIS — J449 Chronic obstructive pulmonary disease, unspecified: Secondary | ICD-10-CM | POA: Diagnosis not present

## 2023-07-16 DIAGNOSIS — R601 Generalized edema: Secondary | ICD-10-CM | POA: Diagnosis not present

## 2023-07-16 DIAGNOSIS — I5033 Acute on chronic diastolic (congestive) heart failure: Secondary | ICD-10-CM | POA: Diagnosis not present

## 2023-07-16 DIAGNOSIS — J441 Chronic obstructive pulmonary disease with (acute) exacerbation: Secondary | ICD-10-CM | POA: Diagnosis not present

## 2023-07-16 DIAGNOSIS — J9621 Acute and chronic respiratory failure with hypoxia: Secondary | ICD-10-CM | POA: Diagnosis not present

## 2023-07-16 DIAGNOSIS — J9622 Acute and chronic respiratory failure with hypercapnia: Secondary | ICD-10-CM | POA: Diagnosis not present

## 2023-07-16 LAB — BASIC METABOLIC PANEL
Anion gap: 5 (ref 5–15)
BUN: 24 mg/dL — ABNORMAL HIGH (ref 8–23)
CO2: 35 mmol/L — ABNORMAL HIGH (ref 22–32)
Calcium: 9.1 mg/dL (ref 8.9–10.3)
Chloride: 99 mmol/L (ref 98–111)
Creatinine, Ser: 0.83 mg/dL (ref 0.61–1.24)
GFR, Estimated: >60 mL/min (ref >60)
Glucose, Bld: 107 mg/dL — ABNORMAL HIGH (ref 70–99)
Potassium: 4.2 mmol/L (ref 3.5–5.1)
Sodium: 139 mmol/L (ref 135–145)

## 2023-07-16 LAB — CBC
HCT: 35.3 % — ABNORMAL LOW (ref 39.0–52.0)
Hemoglobin: 10.9 g/dL — ABNORMAL LOW (ref 13.0–17.0)
MCH: 29.4 pg (ref 26.0–34.0)
MCHC: 30.9 g/dL (ref 30.0–36.0)
MCV: 95.1 fL (ref 80.0–100.0)
Platelets: 180 10*3/uL (ref 150–400)
RBC: 3.71 MIL/uL — ABNORMAL LOW (ref 4.22–5.81)
RDW: 14.7 % (ref 11.5–15.5)
WBC: 7.4 10*3/uL (ref 4.0–10.5)
nRBC: 0 % (ref 0.0–0.2)

## 2023-07-16 LAB — MAGNESIUM: Magnesium: 2.5 mg/dL — ABNORMAL HIGH (ref 1.7–2.4)

## 2023-07-16 MED ORDER — ALBUTEROL SULFATE HFA 108 (90 BASE) MCG/ACT IN AERS
2.0000 | INHALATION_SPRAY | Freq: Four times a day (QID) | RESPIRATORY_TRACT | 3 refills | Status: AC | PRN
  Filled 2023-07-16: qty 18, 30d supply, fill #0

## 2023-07-16 MED ORDER — TORSEMIDE 20 MG PO TABS
40.0000 mg | ORAL_TABLET | Freq: Every day | ORAL | 2 refills | Status: DC
  Filled 2023-07-16: qty 60, 30d supply, fill #0

## 2023-07-16 MED ORDER — BUDESONIDE 0.5 MG/2ML IN SUSP
0.5000 mg | Freq: Two times a day (BID) | RESPIRATORY_TRACT | 2 refills | Status: DC
  Filled 2023-07-16: qty 120, 30d supply, fill #0

## 2023-07-16 NOTE — Telephone Encounter (Signed)
 Patient Product/process development scientist completed.    The patient is insured through [facility] MEDICAID.     Ran test claim for Entresto 24-26 mg and the current 30 day co-pay is $4.00.  Ran test claim for Farxiga 10 mg and Requires Prior Authorization  Ran test claim for Jardiance 10 mg and Requires Prior Authorization   This test claim was processed through [employer]- copay amounts may vary at other pharmacies due to [employer], or as the patient moves through the different stages of their insurance plan.     Roland Earl, CPHT Pharmacy Technician III Certified Patient Advocate [facility] Patient Advocate Team Direct Number: [phone]  Fax: [phone]

## 2023-07-16 NOTE — Discharge Summary (Signed)
 Physician Discharge Summary   Patient: Christopher Mason MRN: [id number] DOB: 1960-09-12  Admit date:     07/11/2023  Discharge date: 07/16/23  Discharge Physician: Arnetha Courser   PCP: Sallyanne Kuster, NP   Recommendations at discharge:  Please obtain CBC and BMP on follow-up Patient's home oxygen requirement has been increased to 5 to 6 L, please reassess on follow-up Follow-up with primary care provider Follow-up with pulmonology Outpatient palliative care referral was provided  Discharge Diagnoses: Principal Problem:   Stage 3 severe COPD by GOLD classification (HCC) Active Problems:   Acute on chronic respiratory failure with hypoxia (HCC)   COPD exacerbation (HCC)   Anasarca  Resolved Problems:   * No resolved hospital problems. *  Hospital Course: Christopher Mason is a 63 y.o. 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   Patient initially required BiPAP and then later transition to baseline oxygen requirement of 4 L.  He will continue using BiPAP at night and while taking naps during the day.  Patient was found to becoming more hypoxic with ambulation requiring up to 6 L of oxygen.  He was advised to increase his oxygen to 6 L specially during ambulation.  He can maintain his saturation above 88%.  Pulmonary was also consulted and he is being discharged on DuoNeb 4 times a day, Pulmicort twice daily and albuterol as needed according to pulmonology advise as medications given during hospitalization was not covered by his insurance.  Patient with no wheezing and does not required steroid.  There was some concern of lower extremity edema, responded well to IV diuresis and he will do torsemide at home.  Renal functions remained stable.  Mildly elevated BNP.  Echocardiogram was done recently  so it was not repeated.  There is also concern of medication noncompliance.  We ordered an RN with home health services to help with medications.  Patient is morbidly obese with very limited mobility at baseline.  He was encouraged to stay mobile.  Patient will continue the rest of his home medications and need to have a close follow-up with his providers including pulmonary for further assistance.  An outpatient referral for palliative care was also given as advised by pulmonary due to advanced COPD.   Consultants: Pulmonology Procedures performed: None Disposition: Home health Diet recommendation:  Discharge Diet Orders (From admission, onward)     Start     Ordered   07/16/23 0000  Diet - low sodium heart healthy        07/16/23 1024           Regular diet DISCHARGE MEDICATION: Allergies as of 07/16/2023       Reactions   Aspirin Nausea And Vomiting, Swelling        Medication List     STOP taking these medications    furosemide 40 MG tablet Commonly known as: LASIX       TAKE these medications    albuterol 108 (90 Base) MCG/ACT inhaler Commonly known as: VENTOLIN HFA Inhale 2 puffs into the lungs every 6 (six) hours as needed for wheezing or shortness of breath.   amitriptyline 25 MG tablet Commonly known as: ELAVIL Take 1 tablet (25 mg total) by mouth at bedtime.   amLODipine 10 MG tablet Commonly known as: NORVASC Take 0.5 tablets (5 mg total) by mouth daily.  What changed: how much to take   atorvastatin 40 MG tablet Commonly known as: LIPITOR Take 1 tablet (40 mg total) by mouth daily.   bisacodyl 5 MG EC tablet Commonly known as: DULCOLAX Take 2 tablets (10 mg total) by mouth at bedtime.   budesonide 0.5 MG/2ML nebulizer solution Commonly known as: PULMICORT Take 2 mLs (0.5 mg total) by nebulization 2 (two) times daily.   gabapentin 100 MG capsule Commonly known as: NEURONTIN Take 1 capsule (100 mg total) by mouth 3 (three) times  daily. What changed: when to take this   Gean Birchwood 234 MG/1.5ML injection Generic drug: paliperidone Inject 234 mg into the muscle as directed. Every 4 weeks   ipratropium-albuterol 0.5-2.5 (3) MG/3ML Soln Commonly known as: DUONEB Take 3 mLs by nebulization 4 (four) times daily.   levothyroxine 150 MCG tablet Commonly known as: SYNTHROID Take 1 tablet (150 mcg total) by mouth daily before breakfast. On empty stomach, wait 30 minutes before eating or taking other medications.   multivitamin with minerals Tabs tablet Take 1 tablet by mouth daily.   pantoprazole 40 MG tablet Commonly known as: PROTONIX Take 1 tablet (40 mg total) by mouth daily.   polyethylene glycol 17 g packet Commonly known as: MIRALAX / GLYCOLAX Take 17 g by mouth daily as needed for mild constipation.   torsemide 20 MG tablet Commonly known as: DEMADEX Take 2 tablets (40 mg total) by mouth daily.   traZODone 100 MG tablet Commonly known as: DESYREL Take 100 mg by mouth at bedtime.   venlafaxine 100 MG tablet Commonly known as: EFFEXOR Take 100 mg by mouth 2 (two) times daily.   Vitamin D (Ergocalciferol) 1.25 MG (50000 UNIT) Caps capsule Commonly known as: DRISDOL Take 1 capsule (50,000 Units total) by mouth every 7 (seven) days.               Durable Medical Equipment  (From admission, onward)           Start     Ordered   07/16/23 0905  For home use only DME oxygen  Once       Question Answer Comment  Length of Need Lifetime   Mode or (Route) Nasal cannula   Liters per Minute 6   Frequency Continuous (stationary and portable oxygen unit needed)   Oxygen conserving device Yes   Oxygen delivery system Gas      07/16/23 0904            Follow-up Information     Sallyanne Kuster, NP. Schedule an appointment as soon as possible for a visit in 1 week(s).   Specialty: Nurse Practitioner Why: Hospital Follow up: 08-02-2023 @ 3 PM. Bring discharge papers and arrive 15  mintues early. Contact information: [address] [place] [address] [phone]         Salena Saner, MD. Schedule an appointment as soon as possible for a visit in 1 week(s).   Specialty: Pulmonary Disease Why: Hospital Follow up: 07-24-2023 @ 11 AM. Please arrive 15 mintues early. Contact information: [address] [place] [address] [phone]                Discharge Exam: Ceasar Mons Weights   07/14/23 0500 07/15/23 0625 07/16/23 0500  Weight: 121.8 kg 125.1 kg 123 kg   General.  Morbidly obese gentleman, in no acute distress. Pulmonary.  Lungs clear bilaterally, normal respiratory effort. CV.  Regular rate and rhythm, no JVD, rub or murmur.  Abdomen.  Soft, nontender, nondistended, BS positive. CNS.  Alert and oriented .  No focal neurologic deficit. Extremities.  Trace LE edema, no cyanosis, pulses intact and symmetrical.  Condition at discharge: stable  The results of significant diagnostics from this hospitalization (including imaging, microbiology, ancillary and laboratory) are listed below for reference.   Imaging Studies: DG Chest Port 1 View Result Date: 07/13/2023 CLINICAL DATA:  [id number].  Respiratory distress and COPD. EXAM: PORTABLE CHEST 1 VIEW COMPARISON:  Portable chest 07/11/2023. FINDINGS: 5:09 a.m. There is increasing patchy atelectasis versus consolidation in the base of the lungs along side the hemidiaphragms. There is increased opacity in the right lung apex which could indicate an infiltrate or could relate to technique, but in any case was not present previously. Small pleural effusions appear to have developed. There is mild cardiomegaly and central vascular prominence without overt edema. Mediastinum is stable with aortic tortuosity and calcification. No new osseous findings. IMPRESSION: 1. Increasing patchy atelectasis versus consolidation in the base of the lungs. 2. Increased opacity in the right lung apex which could  indicate an infiltrate or could relate to technique, but in any case was not present previously. 3. Small pleural effusions appear to have developed. 4. Cardiomegaly and central vascular prominence without overt edema. Electronically Signed   By: Almira Bar M.D.   On: 07/13/2023 07:13   DG Chest Portable 1 View Result Date: 07/11/2023 CLINICAL DATA:  Shortness of breath EXAM: PORTABLE CHEST 1 VIEW COMPARISON:  X-ray 05/22/2023 and older.  CT 05/25/2023 FINDINGS: Poor inflation. Lordotic x-ray. Stable enlarged cardiopericardial silhouette with calcified tortuous aorta. Prominent interstitium and vascular congestion. No pneumothorax or consolidation. Overlapping cardiac leads. Films are under penetrated. IMPRESSION: Limited radiograph. Enlarged cardiac silhouette with vascular congestion and interstitial prominence. Electronically Signed   By: Karen Kays M.D.   On: 07/11/2023 15:09    Microbiology: Results for orders placed or performed during the hospital encounter of 07/11/23  Resp panel by RT-PCR (RSV, Flu A&B, Covid) Anterior Nasal Swab     Status: None   Collection Time: 07/12/23  9:07 AM   Specimen: Anterior Nasal Swab  Result Value Ref Range Status   SARS Coronavirus 2 by RT PCR NEGATIVE NEGATIVE Final    Comment: (NOTE) SARS-CoV-2 target nucleic acids are NOT DETECTED.  The SARS-CoV-2 RNA is generally detectable in upper respiratory specimens during the acute phase of infection. The lowest concentration of SARS-CoV-2 viral copies this assay can detect is 138 copies/mL. A negative result does not preclude SARS-Cov-2 infection and should not be used as the sole basis for treatment or other patient management decisions. A negative result may occur with  improper specimen collection/handling, submission of specimen other than nasopharyngeal swab, presence of viral mutation(s) within the areas targeted by this assay, and inadequate number of viral copies(<138 copies/mL). A negative  result must be combined with clinical observations, patient history, and epidemiological information. The expected result is Negative.  Fact Sheet for Patients:  BloggerCourse.com  Fact Sheet for Healthcare Providers:  SeriousBroker.it  This test is no t yet approved or cleared by the [place] FDA and  has been authorized for detection and/or diagnosis of SARS-CoV-2 by FDA under an Emergency Use Authorization (EUA). This EUA will remain  in effect (meaning this test can be used) for the duration of the COVID-19 declaration under Section 564(b)(1) of the Act, 21 U.S.C.section 360bbb-3(b)(1), unless the authorization is terminated  or revoked sooner.       Influenza A  by PCR NEGATIVE NEGATIVE Final   Influenza B by PCR NEGATIVE NEGATIVE Final    Comment: (NOTE) The Xpert Xpress SARS-CoV-2/FLU/RSV plus assay is intended as an aid in the diagnosis of influenza from Nasopharyngeal swab specimens and should not be used as a sole basis for treatment. Nasal washings and aspirates are unacceptable for Xpert Xpress SARS-CoV-2/FLU/RSV testing.  Fact Sheet for Patients: BloggerCourse.com  Fact Sheet for Healthcare Providers: SeriousBroker.it  This test is not yet approved or cleared by the [place] FDA and has been authorized for detection and/or diagnosis of SARS-CoV-2 by FDA under an Emergency Use Authorization (EUA). This EUA will remain in effect (meaning this test can be used) for the duration of the COVID-19 declaration under Section 564(b)(1) of the Act, 21 U.S.C. section 360bbb-3(b)(1), unless the authorization is terminated or revoked.     Resp Syncytial Virus by PCR NEGATIVE NEGATIVE Final    Comment: (NOTE) Fact Sheet for Patients: BloggerCourse.com  Fact Sheet for Healthcare Providers: SeriousBroker.it  This  test is not yet approved or cleared by the [place] FDA and has been authorized for detection and/or diagnosis of SARS-CoV-2 by FDA under an Emergency Use Authorization (EUA). This EUA will remain in effect (meaning this test can be used) for the duration of the COVID-19 declaration under Section 564(b)(1) of the Act, 21 U.S.C. section 360bbb-3(b)(1), unless the authorization is terminated or revoked.  Performed at [facility], [address] [place], [place] [address]     Labs: CBC: Recent Labs  Lab 07/11/23 1236 07/13/23 0433 07/14/23 0404 07/15/23 0347 07/16/23 0442  WBC 8.5 12.5* 8.5 7.4 7.4  NEUTROABS 6.1  --   --   --   --   HGB 11.4* 10.7* 10.1* 10.5* 10.9*  HCT 38.3* 35.0* 32.9* 33.7* 35.3*  MCV 98.7 94.9 95.4 94.7 95.1  PLT 194 212 185 177 180   Basic Metabolic Panel: Recent Labs  Lab 07/12/23 0424 07/13/23 0433 07/14/23 0404 07/15/23 0347 07/16/23 0442  NA 138 140 141 138 139  K 4.0 4.4 3.8 4.1 4.2  CL 97* 99 99 98 99  CO2 29 32 33* 35* 35*  GLUCOSE 197* 209* 118* 105* 107*  BUN 22 28* 29* 28* 24*  CREATININE 0.91 0.95 0.94 0.91 0.83  CALCIUM 9.6 9.3 9.0 8.6* 9.1  MG  --  2.3 2.4 2.4 2.5*   Liver Function Tests: Recent Labs  Lab 07/11/23 1236  AST 18  ALT 20  ALKPHOS 83  BILITOT 0.7  PROT 7.3  ALBUMIN 3.6   CBG: Recent Labs  Lab 07/13/23 0810  GLUCAP 142*    Discharge time spent: greater than 30 minutes.  This record has been created using Conservation officer, historic buildings. Errors have been sought and corrected,but may not always be located. Such creation errors do not reflect on the standard of care.   Signed: Arnetha Courser, MD [facility] 07/16/2023

## 2023-07-16 NOTE — TOC Transition Note (Addendum)
 Transition of Care [facility]) - Discharge Note   Patient Details  Name: Christopher Mason MRN: [id number] Date of Birth: 1960/06/21  Transition of Care [facility]) CM/SW Contact:  Truddie Hidden, RN Phone Number: 07/16/2023, 11:33 AM   Clinical Narrative:    Spoke with patient regarding discharge today. Patient stated Lura Em would transport him home. He was advised the number for Patsy was not a valid  number. Patient provided phone number [phone]. Patient advised of new liter flow changed RNCM advised [employer] will call to arranged appointment for home delivery of additional oxygen supplies.   Contact Patsy at [phone]. She was informed patient is discharging today. Patsy advised home oxygen will need to be brought to the hospital for discharge. She was aslo advised of liter flow change. Patsy advised [employer] will arranged for oxygen supplies to be delivered to patient's home today.   12:00pm Patient stated his home tanks are empty and he does not know how to fill them. He was advised a loaner tank will be brought by [employer] to discharge home with. He was advised the representative from [employer] will educate him today when his oxygen is delivered. Pasty contacted and updated about loaner tank. Patient ride will be here in approximately one hour.   TOC signing off.          Patient Goals and CMS Choice            Discharge Placement                       Discharge Plan and Services Additional resources added to the After Visit Summary for                                       Social Drivers of Health (SDOH) Interventions SDOH Screenings   Food Insecurity: No Food Insecurity (07/12/2023)  Housing: Low Risk  (07/12/2023)  Transportation Needs: No Transportation Needs (07/12/2023)  Utilities: Not At Risk (07/12/2023)  Alcohol Screen: Low Risk  (09/06/2021)  Depression (PHQ2-9): Low Risk  (09/06/2021)  Social Connections: Socially Isolated (07/12/2023)  Tobacco Use: Medium  Risk (07/11/2023)     Readmission Risk Interventions    07/11/2023    8:25 PM  Readmission Risk Prevention Plan  Transportation Screening Complete  PCP or Specialist Appt within 3-5 Days Complete  HRI or Home Care Consult Complete  Social Work Consult for Recovery Care Planning/Counseling Complete  Palliative Care Screening Not Applicable  Medication Review Oceanographer) Complete

## 2023-07-16 NOTE — Telephone Encounter (Signed)
 Ok to send

## 2023-07-16 NOTE — TOC Progression Note (Signed)
 Transition of Care [facility]) - Progression Note    Patient Details  Name: Christopher Mason MRN: [id number] Date of Birth: Sep 24, 1960  Transition [facility] Care [facility]) CM/SW Contact  Truddie Hidden, RN Phone Number: 07/16/2023, 10:19 AM  Clinical Narrative:    Per MD patient will require 5-6 liters of continuous oxygen. Patient's prior baseline was 4L. New order requested for liter flow change.   Request sent to [facility] from [employer].          Expected Discharge Plan and Services                                               Social Determinants of Health (SDOH) Interventions SDOH Screenings   Food Insecurity: No Food Insecurity (07/12/2023)  Housing: Low Risk  (07/12/2023)  Transportation Needs: No Transportation Needs (07/12/2023)  Utilities: Not At Risk (07/12/2023)  Alcohol Screen: Low Risk  (09/06/2021)  Depression (PHQ2-9): Low Risk  (09/06/2021)  Social Connections: Socially Isolated (07/12/2023)  Tobacco Use: Medium Risk (07/11/2023)    Readmission Risk Interventions    07/11/2023    8:25 PM  Readmission Risk Prevention Plan  Transportation Screening Complete  PCP or Specialist Appt within 3-5 Days Complete  HRI or Home Care Consult Complete  Social Work Consult for Recovery Care Planning/Counseling Complete  Palliative Care Screening Not Applicable  Medication Review Oceanographer) Complete
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
## 2023-07-16 NOTE — TOC Progression Note (Signed)
 Transition of Care [facility]) - Progression Note    Patient Details  Name: Christopher Mason MRN: [id number] Date of Birth: 03-22-1961  Transition of [facility]) CM/SW Contact  Truddie Hidden, RN Phone Number: 07/16/2023, 8:59 AM  Clinical Narrative:    TOC continuing to follow patient's progress throughout discharge planning.        Expected Discharge Plan and Services                                               Social Determinants of Health (SDOH) Interventions SDOH Screenings   Food Insecurity: No Food Insecurity (07/12/2023)  Housing: Low Risk  (07/12/2023)  Transportation Needs: No Transportation Needs (07/12/2023)  Utilities: Not At Risk (07/12/2023)  Alcohol Screen: Low Risk  (09/06/2021)  Depression (PHQ2-9): Low Risk  (09/06/2021)  Social Connections: Socially Isolated (07/12/2023)  Tobacco Use: Medium Risk (07/11/2023)    Readmission Risk Interventions    07/11/2023    8:25 PM  Readmission Risk Prevention Plan  Transportation Screening Complete  PCP or Specialist Appt within 3-5 Days Complete  HRI or Home Care Consult Complete  Social Work Consult for Recovery Care Planning/Counseling Complete  Palliative Care Screening Not Applicable  Medication Review Oceanographer) Complete
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
## 2023-07-16 NOTE — Progress Notes (Signed)
 Heart Failure Stewardship Pharmacy Note  PCP: Sallyanne Kuster, NP PCP-Cardiologist: None  HPI: Christopher Mason is a 63 y.o. 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 On admission, BNP was 37.9, HS-troponin was 13, and T4/TSH were normal. Chest x-ray noted vascular congestion.    Pertinent cardiac history: Echo 06/2020 with EF 65%. Negative stress test in 04/2022. Echo 08/2022 with LVEF 55-60%. Echo in 05/2023 with LVEF 55-60%, mild LVH, and normal RV function.  Pertinent Lab Values: Creatinine, Ser  Date Value Ref Range Status  07/16/2023 0.83 0.61 - 1.24 mg/dL Final   BUN  Date Value Ref Range Status  07/16/2023 24 (H) 8 - 23 mg/dL Final  16/01/9603 15 8 - 27 mg/dL Final   Potassium  Date Value Ref Range Status  07/16/2023 4.2 3.5 - 5.1 mmol/L Final   Sodium  Date Value Ref Range Status  07/16/2023 139 135 - 145 mmol/L Final  03/19/2023 141 134 - 144 mmol/L Final   B Natriuretic Peptide  Date Value Ref Range Status  07/11/2023 37.9 0.0 - 100.0 pg/mL Final    Comment:    Performed at [facility], [address] [place], [place] [address]   Magnesium  Date Value Ref Range Status  07/16/2023 2.5 (H) 1.7 - 2.4 mg/dL Final    Comment:    Performed at [facility], [address] [place], [place] [address]   Hgb A1c MFr Bld  Date Value Ref Range Status  05/23/2023 5.5 4.8 - 5.6 % Final    Comment:    (NOTE) Pre diabetes:          5.7%-6.4%  Diabetes:              >6.4%  Glycemic control for   <7.0% adults with diabetes    TSH  Date Value Ref Range Status  07/12/2023 2.269 0.350 - 4.500 uIU/mL Final    Comment:    Performed by a 3rd Generation assay with a functional sensitivity of <=0.01 uIU/mL. Performed at [facility], [address] [place], [place] [address]    03/19/2023 176.000 (H) 0.450 - 4.500 uIU/mL Final    Comment:    Results confirmed on dilution.     Vital Signs: Admission weight: Temp:  [97.9 F (36.6 C)-98.7 F (37.1 C)] 98.4 F (36.9 C) (03/24 0734) Pulse Rate:  [67-90] 67 (03/24 0734) Cardiac Rhythm: Normal sinus rhythm;Bundle branch block (03/23 1023) Resp:  [12-20] 12 (03/24 0402) BP: (126-169)/(60-85) 128/84 (03/24 0734) SpO2:  [92 %-98 %] 98 % (03/24 0734) FiO2 (%):  [36 %-50 %] 50 % (03/24 0728) Weight:  [123 kg (271 lb 2.7 oz)] 123 kg (271 lb 2.7 oz) (03/24 0500)  Intake/Output Summary (Last 24 hours) at 07/16/2023 0826 Last data filed at 07/16/2023 0408 Gross per 24 hour  Intake 363 ml  Output 1925 ml  Net -1562 ml    Current Heart Failure Medications:  Loop diuretic: furosemide 40 mg IV BID Beta-Blocker: none ACEI/ARB/ARNI: none MRA: none SGLT2i: none Other: none  Prior to admission Heart Failure Medications:  Loop diuretic: furosemide 40 mg BID Beta-Blocker: none ACEI/ARB/ARNI: none MRA: none SGLT2i: none Other: amlodipine 10 mg daily  Assessment: 1. Acute on chronic diastolic heart failure (LVEF 55-60%)  , due to NICM. NYHA class IIb symptoms.  -Symptoms: Reports feeling well today. Reports breathing is at baseline  and LEE is only on right leg - negative for DVT in January, but no doppler this admission. -Volume: Appears relatively euvolemic. -Hemodynamics: BP elevated. -SGLT2i: Would consider Farxiga or Jardiance as first line therapy for HFpEF.  -MRA: Would consider adding spironolactone for HFpEF and BP with attempts to decrease amlodipine.  -ARNI: Would consider adding Entresto given markedly elevated BP with attempts to decrease   Plan: 1) Medication changes recommended at this time: -Would recommend addition of SGLT2i and spironolactone at first outpatient follow-up given that discharge orders have already been sent today.  2) Patient assistance: -Pending  3) Education: - Patient has  been educated on current HF medications and potential additions to HF medication regimen - Patient verbalizes understanding that over the next few months, these medication doses may change and more medications may be added to optimize HF regimen - Patient has been educated on basic disease state pathophysiology and goals of therapy  Medication Assistance / Insurance Benefits Check: Does the patient have prescription insurance?    Type of insurance plan:  Does the patient qualify for medication assistance through manufacturers or grants? Pending  Outpatient Pharmacy: Prior to admission outpatient pharmacy: [facility]      Please do not hesitate to reach out with questions or concerns,  Enos Fling, PharmD, CPP, BCPS Heart Failure Pharmacist  Phone - [phone] 07/16/2023 2:43 PM

## 2023-07-17 ENCOUNTER — Telehealth: Payer: Self-pay

## 2023-07-17 NOTE — Telephone Encounter (Signed)
 [facility] palliative care called [phone] just FYI that they got referral from hospital

## 2023-07-24 ENCOUNTER — Inpatient Hospital Stay: Admitting: Pulmonary Disease

## 2023-07-27 ENCOUNTER — Other Ambulatory Visit (HOSPITAL_COMMUNITY): Payer: Self-pay

## 2023-07-27 ENCOUNTER — Other Ambulatory Visit: Payer: Self-pay

## 2023-07-30 NOTE — Telephone Encounter (Signed)
 PCP: Sallyanne Kuster, NP

## 2023-08-02 ENCOUNTER — Ambulatory Visit: Admitting: Nurse Practitioner

## 2023-08-02 ENCOUNTER — Encounter: Payer: Self-pay | Admitting: Nurse Practitioner

## 2023-08-02 VITALS — BP 127/88 | HR 81 | Temp 98.4°F | Resp 16 | Ht 67.0 in | Wt 262.0 lb

## 2023-08-02 DIAGNOSIS — J449 Chronic obstructive pulmonary disease, unspecified: Secondary | ICD-10-CM | POA: Diagnosis not present

## 2023-08-02 DIAGNOSIS — J9611 Chronic respiratory failure with hypoxia: Secondary | ICD-10-CM

## 2023-08-02 DIAGNOSIS — L299 Pruritus, unspecified: Secondary | ICD-10-CM

## 2023-08-02 DIAGNOSIS — Z09 Encounter for follow-up examination after completed treatment for conditions other than malignant neoplasm: Secondary | ICD-10-CM

## 2023-08-02 DIAGNOSIS — E039 Hypothyroidism, unspecified: Secondary | ICD-10-CM | POA: Diagnosis not present

## 2023-08-02 DIAGNOSIS — J9612 Chronic respiratory failure with hypercapnia: Secondary | ICD-10-CM

## 2023-08-02 MED ORDER — HYDROXYZINE HCL 10 MG PO TABS: 10.0000 mg | ORAL_TABLET | Freq: Three times a day (TID) | ORAL | 0 refills | Status: DC | PRN
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
## 2023-08-03 ENCOUNTER — Encounter: Payer: Self-pay | Admitting: Nurse Practitioner

## 2023-08-08 ENCOUNTER — Telehealth: Payer: Self-pay | Admitting: Nurse Practitioner

## 2023-08-08 NOTE — Telephone Encounter (Signed)
 Patient called to f/u on paperwork he had given to AA to complete. Per AA, she is still working on it. Should have done by end of week. Attempted to notify patient. No answer. MB full-Toni

## 2023-08-11 ENCOUNTER — Encounter: Payer: Self-pay | Admitting: Nurse Practitioner

## 2023-08-15 ENCOUNTER — Encounter: Payer: Self-pay | Admitting: Nurse Practitioner

## 2023-08-16 ENCOUNTER — Telehealth: Payer: Self-pay | Admitting: Nurse Practitioner

## 2023-08-16 NOTE — Telephone Encounter (Addendum)
 [facility] paperwork competed. Faxed to [employer]. Scanned. Notified patient. Someone will come by office to p/u from front desk-Toni

## 2023-08-30 ENCOUNTER — Ambulatory Visit: Payer: Medicaid Other | Admitting: Pulmonary Disease

## 2023-09-03 ENCOUNTER — Encounter: Payer: Self-pay | Admitting: Nurse Practitioner

## 2023-09-03 ENCOUNTER — Telehealth (INDEPENDENT_AMBULATORY_CARE_PROVIDER_SITE_OTHER): Admitting: Nurse Practitioner

## 2023-09-03 VITALS — Resp 16 | Ht 67.0 in

## 2023-09-03 DIAGNOSIS — J449 Chronic obstructive pulmonary disease, unspecified: Secondary | ICD-10-CM | POA: Diagnosis not present

## 2023-09-03 DIAGNOSIS — M79604 Pain in right leg: Secondary | ICD-10-CM

## 2023-09-03 DIAGNOSIS — K219 Gastro-esophageal reflux disease without esophagitis: Secondary | ICD-10-CM

## 2023-09-03 DIAGNOSIS — J9611 Chronic respiratory failure with hypoxia: Secondary | ICD-10-CM

## 2023-09-03 DIAGNOSIS — I10 Essential (primary) hypertension: Secondary | ICD-10-CM | POA: Diagnosis not present

## 2023-09-03 DIAGNOSIS — G14 Postpolio syndrome: Secondary | ICD-10-CM

## 2023-09-03 DIAGNOSIS — Z79899 Other long term (current) drug therapy: Secondary | ICD-10-CM

## 2023-09-03 DIAGNOSIS — J9612 Chronic respiratory failure with hypercapnia: Secondary | ICD-10-CM

## 2023-09-03 DIAGNOSIS — M79605 Pain in left leg: Secondary | ICD-10-CM

## 2023-09-03 DIAGNOSIS — E559 Vitamin D deficiency, unspecified: Secondary | ICD-10-CM

## 2023-09-03 MED ORDER — GABAPENTIN 300 MG PO CAPS: 300.0000 mg | ORAL_CAPSULE | Freq: Three times a day (TID) | ORAL | 5 refills | Status: DC

## 2023-09-03 MED ORDER — PANTOPRAZOLE SODIUM 40 MG PO TBEC: 40.0000 mg | DELAYED_RELEASE_TABLET | Freq: Every day | ORAL | 11 refills | Status: DC

## 2023-09-03 MED ORDER — AMLODIPINE BESYLATE 10 MG PO TABS: 10.0000 mg | ORAL_TABLET | Freq: Every day | ORAL | 3 refills | Status: DC

## 2023-09-03 MED ORDER — TORSEMIDE 20 MG PO TABS: 40.0000 mg | ORAL_TABLET | Freq: Every day | ORAL | 2 refills | Status: DC

## 2023-09-03 MED ORDER — VITAMIN D (ERGOCALCIFEROL) 1.25 MG (50000 UNIT) PO CAPS
50000.0000 [IU] | ORAL_CAPSULE | ORAL | 5 refills | Status: DC
Start: 2023-09-03 — End: 2024-02-21

## 2023-09-03 MED ORDER — TRAMADOL HCL 50 MG PO TABS: 50.0000 mg | ORAL_TABLET | Freq: Four times a day (QID) | ORAL | 0 refills | Status: AC | PRN

## 2023-09-03 MED ORDER — BUDESONIDE 0.5 MG/2ML IN SUSP: 0.5000 mg | Freq: Two times a day (BID) | RESPIRATORY_TRACT | 2 refills | Status: DC
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
## 2023-09-05 ENCOUNTER — Other Ambulatory Visit: Payer: Self-pay | Admitting: Nurse Practitioner

## 2023-09-11 ENCOUNTER — Telehealth: Payer: Self-pay | Admitting: Nurse Practitioner

## 2023-09-11 ENCOUNTER — Ambulatory Visit: Admitting: Pulmonary Disease

## 2023-09-11 NOTE — Telephone Encounter (Signed)
 Per request. MR faxed to [facility]; [phone]

## 2023-09-23 ENCOUNTER — Encounter: Payer: Self-pay | Admitting: Nurse Practitioner

## 2023-09-25 ENCOUNTER — Ambulatory Visit: Admitting: Pulmonary Disease

## 2023-09-26 ENCOUNTER — Ambulatory Visit: Admitting: Nurse Practitioner

## 2023-09-27 ENCOUNTER — Encounter: Payer: Self-pay | Admitting: Nurse Practitioner

## 2023-09-27 ENCOUNTER — Ambulatory Visit: Admitting: Nurse Practitioner

## 2023-09-27 ENCOUNTER — Telehealth: Payer: Self-pay

## 2023-09-27 VITALS — BP 120/73 | HR 83 | Temp 98.4°F | Resp 16 | Ht 67.0 in

## 2023-09-27 DIAGNOSIS — L299 Pruritus, unspecified: Secondary | ICD-10-CM

## 2023-09-27 DIAGNOSIS — M5442 Lumbago with sciatica, left side: Secondary | ICD-10-CM

## 2023-09-27 DIAGNOSIS — M5441 Lumbago with sciatica, right side: Secondary | ICD-10-CM | POA: Insufficient documentation

## 2023-09-27 DIAGNOSIS — J9611 Chronic respiratory failure with hypoxia: Secondary | ICD-10-CM

## 2023-09-27 DIAGNOSIS — I10 Essential (primary) hypertension: Secondary | ICD-10-CM

## 2023-09-27 DIAGNOSIS — J449 Chronic obstructive pulmonary disease, unspecified: Secondary | ICD-10-CM

## 2023-09-27 DIAGNOSIS — J9612 Chronic respiratory failure with hypercapnia: Secondary | ICD-10-CM

## 2023-09-27 MED ORDER — GABAPENTIN 300 MG PO CAPS: 300.0000 mg | ORAL_CAPSULE | Freq: Three times a day (TID) | ORAL | 5 refills | Status: DC

## 2023-09-27 MED ORDER — HYDROXYZINE HCL 10 MG PO TABS: 10.0000 mg | ORAL_TABLET | Freq: Three times a day (TID) | ORAL | 11 refills | Status: AC | PRN

## 2023-09-27 NOTE — Telephone Encounter (Signed)
-----   Message from Nurse Severiano Danish sent at 09/27/2023  1:41 PM EDT ----- Regarding: RE: mutual patient in need of appointment Thank you for letting us  know. We will give him a call to offer him an appointment with a PA for further evaluation.  -Dazani Norby, RN [facility]surgery at [facility] of Dr Jodeen Munch, Dr Henderson Lock, Ludwig Safer, PA-C, Anastacio Karvonen, PA-C, Lucetta Russel, PA-C ----- Message ----- From: Laurence Pons, NP Sent: 09/27/2023  11:25 AM EDT To: [facility] Admin; # Subject: mutual patient in need of appointment          Hello neurosurgery team,  I am sending this message about Mr. Mccamy. He was supposed to be seen in February this year at first with Dr. Mont Antis and then was rescheduled with Lucetta Russel but he ended up being admitted to the hospital and was not able to be seen. He did not schedule another appointment after that.   I am seeing him today for a follow up visit with PCP and he is having a lot of back pain and severe sciatica. I encouraged him to call and schedule an appointment with your office but wanted to also let you know so that you can follow up with him if he does not call the office.   Thank you for your time,  Laurence Pons FNP-C [facility] [place] [facility]
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
## 2023-09-28 NOTE — Telephone Encounter (Signed)
 Patient scheduled.
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
## 2023-10-09 ENCOUNTER — Other Ambulatory Visit: Payer: Self-pay

## 2023-10-09 ENCOUNTER — Ambulatory Visit (INDEPENDENT_AMBULATORY_CARE_PROVIDER_SITE_OTHER): Admitting: Orthopedic Surgery

## 2023-10-09 ENCOUNTER — Inpatient Hospital Stay
Admission: EM | Admit: 2023-10-09 | Discharge: 2023-10-14 | DRG: 551 | Disposition: A | Discharge status: Home Health | Attending: Obstetrics and Gynecology | Admitting: Obstetrics and Gynecology

## 2023-10-09 ENCOUNTER — Emergency Department

## 2023-10-09 ENCOUNTER — Encounter: Payer: Self-pay | Admitting: Orthopedic Surgery

## 2023-10-09 VITALS — BP 112/80 | HR 85

## 2023-10-09 DIAGNOSIS — F209 Schizophrenia, unspecified: Secondary | ICD-10-CM | POA: Diagnosis present

## 2023-10-09 DIAGNOSIS — F319 Bipolar disorder, unspecified: Secondary | ICD-10-CM | POA: Diagnosis present

## 2023-10-09 DIAGNOSIS — M5416 Radiculopathy, lumbar region: Secondary | ICD-10-CM

## 2023-10-09 DIAGNOSIS — F1290 Cannabis use, unspecified, uncomplicated: Secondary | ICD-10-CM | POA: Diagnosis present

## 2023-10-09 DIAGNOSIS — J9611 Chronic respiratory failure with hypoxia: Secondary | ICD-10-CM | POA: Diagnosis present

## 2023-10-09 DIAGNOSIS — Z8701 Personal history of pneumonia (recurrent): Secondary | ICD-10-CM

## 2023-10-09 DIAGNOSIS — Z8249 Family history of ischemic heart disease and other diseases of the circulatory system: Secondary | ICD-10-CM

## 2023-10-09 DIAGNOSIS — Z9889 Other specified postprocedural states: Secondary | ICD-10-CM | POA: Diagnosis not present

## 2023-10-09 DIAGNOSIS — E878 Other disorders of electrolyte and fluid balance, not elsewhere classified: Secondary | ICD-10-CM | POA: Diagnosis present

## 2023-10-09 DIAGNOSIS — G14 Postpolio syndrome: Secondary | ICD-10-CM | POA: Diagnosis present

## 2023-10-09 DIAGNOSIS — M47816 Spondylosis without myelopathy or radiculopathy, lumbar region: Secondary | ICD-10-CM

## 2023-10-09 DIAGNOSIS — J9622 Acute and chronic respiratory failure with hypercapnia: Secondary | ICD-10-CM | POA: Diagnosis not present

## 2023-10-09 DIAGNOSIS — J441 Chronic obstructive pulmonary disease with (acute) exacerbation: Secondary | ICD-10-CM | POA: Diagnosis not present

## 2023-10-09 DIAGNOSIS — I10 Essential (primary) hypertension: Secondary | ICD-10-CM

## 2023-10-09 DIAGNOSIS — G9349 Other encephalopathy: Secondary | ICD-10-CM | POA: Diagnosis not present

## 2023-10-09 DIAGNOSIS — M4726 Other spondylosis with radiculopathy, lumbar region: Secondary | ICD-10-CM | POA: Diagnosis not present

## 2023-10-09 DIAGNOSIS — J449 Chronic obstructive pulmonary disease, unspecified: Secondary | ICD-10-CM | POA: Insufficient documentation

## 2023-10-09 DIAGNOSIS — I5030 Unspecified diastolic (congestive) heart failure: Secondary | ICD-10-CM | POA: Diagnosis present

## 2023-10-09 DIAGNOSIS — I110 Hypertensive heart disease with heart failure: Secondary | ICD-10-CM | POA: Diagnosis present

## 2023-10-09 DIAGNOSIS — M5116 Intervertebral disc disorders with radiculopathy, lumbar region: Principal | ICD-10-CM | POA: Diagnosis present

## 2023-10-09 DIAGNOSIS — G8929 Other chronic pain: Secondary | ICD-10-CM | POA: Diagnosis present

## 2023-10-09 DIAGNOSIS — M48061 Spinal stenosis, lumbar region without neurogenic claudication: Secondary | ICD-10-CM | POA: Diagnosis present

## 2023-10-09 DIAGNOSIS — R300 Dysuria: Secondary | ICD-10-CM | POA: Diagnosis present

## 2023-10-09 DIAGNOSIS — F17210 Nicotine dependence, cigarettes, uncomplicated: Secondary | ICD-10-CM | POA: Diagnosis present

## 2023-10-09 DIAGNOSIS — Z860101 Personal history of adenomatous and serrated colon polyps: Secondary | ICD-10-CM

## 2023-10-09 DIAGNOSIS — K219 Gastro-esophageal reflux disease without esophagitis: Secondary | ICD-10-CM | POA: Diagnosis present

## 2023-10-09 DIAGNOSIS — E876 Hypokalemia: Secondary | ICD-10-CM | POA: Diagnosis present

## 2023-10-09 DIAGNOSIS — Z79899 Other long term (current) drug therapy: Secondary | ICD-10-CM

## 2023-10-09 DIAGNOSIS — E669 Obesity, unspecified: Secondary | ICD-10-CM | POA: Diagnosis present

## 2023-10-09 DIAGNOSIS — D649 Anemia, unspecified: Secondary | ICD-10-CM | POA: Diagnosis present

## 2023-10-09 DIAGNOSIS — Z833 Family history of diabetes mellitus: Secondary | ICD-10-CM

## 2023-10-09 DIAGNOSIS — E785 Hyperlipidemia, unspecified: Secondary | ICD-10-CM | POA: Diagnosis present

## 2023-10-09 DIAGNOSIS — M549 Dorsalgia, unspecified: Secondary | ICD-10-CM | POA: Diagnosis present

## 2023-10-09 DIAGNOSIS — Z79890 Hormone replacement therapy (postmenopausal): Secondary | ICD-10-CM

## 2023-10-09 DIAGNOSIS — Z886 Allergy status to analgesic agent status: Secondary | ICD-10-CM

## 2023-10-09 DIAGNOSIS — G4700 Insomnia, unspecified: Secondary | ICD-10-CM | POA: Diagnosis present

## 2023-10-09 DIAGNOSIS — R262 Difficulty in walking, not elsewhere classified: Secondary | ICD-10-CM | POA: Diagnosis present

## 2023-10-09 DIAGNOSIS — J9621 Acute and chronic respiratory failure with hypoxia: Secondary | ICD-10-CM | POA: Diagnosis not present

## 2023-10-09 DIAGNOSIS — E039 Hypothyroidism, unspecified: Secondary | ICD-10-CM | POA: Diagnosis present

## 2023-10-09 DIAGNOSIS — M4807 Spinal stenosis, lumbosacral region: Secondary | ICD-10-CM | POA: Diagnosis present

## 2023-10-09 DIAGNOSIS — Z7951 Long term (current) use of inhaled steroids: Secondary | ICD-10-CM

## 2023-10-09 DIAGNOSIS — M545 Low back pain, unspecified: Principal | ICD-10-CM

## 2023-10-09 LAB — CBC WITH DIFFERENTIAL/PLATELET
Abs Immature Granulocytes: 0.05 10*3/uL (ref 0.00–0.07)
Basophils Absolute: 0.1 10*3/uL (ref 0.0–0.1)
Basophils Relative: 1 %
Eosinophils Absolute: 0.4 10*3/uL (ref 0.0–0.5)
Eosinophils Relative: 4 %
HCT: 37.8 % — ABNORMAL LOW (ref 39.0–52.0)
Hemoglobin: 11.7 g/dL — ABNORMAL LOW (ref 13.0–17.0)
Immature Granulocytes: 1 %
Lymphocytes Relative: 15 %
Lymphs Abs: 1.6 10*3/uL (ref 0.7–4.0)
MCH: 28.3 pg (ref 26.0–34.0)
MCHC: 31 g/dL (ref 30.0–36.0)
MCV: 91.5 fL (ref 80.0–100.0)
Monocytes Absolute: 0.6 10*3/uL (ref 0.1–1.0)
Monocytes Relative: 6 %
Neutro Abs: 8 10*3/uL — ABNORMAL HIGH (ref 1.7–7.7)
Neutrophils Relative %: 73 %
Platelets: 188 10*3/uL (ref 150–400)
RBC: 4.13 MIL/uL — ABNORMAL LOW (ref 4.22–5.81)
RDW: 15.5 % (ref 11.5–15.5)
WBC: 10.7 10*3/uL — ABNORMAL HIGH (ref 4.0–10.5)
nRBC: 0 % (ref 0.0–0.2)

## 2023-10-09 LAB — URINALYSIS, ROUTINE W REFLEX MICROSCOPIC
Bilirubin Urine: NEGATIVE
Glucose, UA: NEGATIVE mg/dL
Hgb urine dipstick: NEGATIVE
Ketones, ur: NEGATIVE mg/dL
Leukocytes,Ua: NEGATIVE
Nitrite: NEGATIVE
Protein, ur: NEGATIVE mg/dL
Specific Gravity, Urine: 1.011 (ref 1.005–1.030)
pH: 5 (ref 5.0–8.0)

## 2023-10-09 LAB — COMPREHENSIVE METABOLIC PANEL WITH GFR
ALT: 16 U/L (ref 0–44)
AST: 21 U/L (ref 15–41)
Albumin: 3.9 g/dL (ref 3.5–5.0)
Alkaline Phosphatase: 82 U/L (ref 38–126)
Anion gap: 14 (ref 5–15)
BUN: 17 mg/dL (ref 8–23)
CO2: 29 mmol/L (ref 22–32)
Calcium: 8.8 mg/dL — ABNORMAL LOW (ref 8.9–10.3)
Chloride: 94 mmol/L — ABNORMAL LOW (ref 98–111)
Creatinine, Ser: 0.99 mg/dL (ref 0.61–1.24)
GFR, Estimated: >60 mL/min (ref >60)
Glucose, Bld: 117 mg/dL — ABNORMAL HIGH (ref 70–99)
Potassium: 3.2 mmol/L — ABNORMAL LOW (ref 3.5–5.1)
Sodium: 137 mmol/L (ref 135–145)
Total Bilirubin: 0.7 mg/dL (ref 0.0–1.2)
Total Protein: 7.8 g/dL (ref 6.5–8.1)

## 2023-10-09 MED ORDER — DEXAMETHASONE SODIUM PHOSPHATE 10 MG/ML IJ SOLN
10.0000 mg | Freq: Once | INTRAMUSCULAR | Status: AC
  Administered 2023-10-09: 10 mg via INTRAVENOUS
  Filled 2023-10-09: qty 1

## 2023-10-09 MED ORDER — LIDOCAINE 5 % EX PTCH
1.0000 | MEDICATED_PATCH | CUTANEOUS | Status: DC
  Administered 2023-10-09 – 2023-10-13 (×5): 1 via TRANSDERMAL
  Filled 2023-10-09 (×5): qty 1

## 2023-10-09 MED ORDER — HYDROMORPHONE HCL 1 MG/ML IJ SOLN
1.0000 mg | Freq: Once | INTRAMUSCULAR | Status: AC
  Administered 2023-10-09: 1 mg via INTRAVENOUS
  Filled 2023-10-09: qty 1

## 2023-10-09 MED ORDER — MORPHINE SULFATE (PF) 4 MG/ML IV SOLN
4.0000 mg | Freq: Once | INTRAVENOUS | Status: AC
  Administered 2023-10-09: 4 mg via INTRAVENOUS
  Filled 2023-10-09: qty 1

## 2023-10-09 MED ORDER — IPRATROPIUM-ALBUTEROL 0.5-2.5 (3) MG/3ML IN SOLN
3.0000 mL | Freq: Once | RESPIRATORY_TRACT | Status: AC
  Administered 2023-10-09: 3 mL via RESPIRATORY_TRACT
  Filled 2023-10-09: qty 3

## 2023-10-09 MED ORDER — ONDANSETRON HCL 4 MG/2ML IJ SOLN
4.0000 mg | Freq: Once | INTRAMUSCULAR | Status: AC
  Administered 2023-10-09: 4 mg via INTRAVENOUS
  Filled 2023-10-09: qty 2

## 2023-10-09 NOTE — Progress Notes (Signed)
 I went to lobby to bring back Christopher Mason. Patient was in wheelchair wearing nasal cannula with oxygen. Patient respirations were labored. This RN checked his oxygen tank. Oxygen tank was empty. A new oxygen canister was provided to the patient.   Patient was assisted to the bathroom. Set up at the side of the toilet. This RN assisted the patient from the toilet to standing using proper mechanics. Patient safely assisted to wheelchair.   Patient was monitored during visit and his effort of breathing improved.  Barbra Ley Caia Lofaro RN-BSN [facility]  Neurosurgery at [facility]

## 2023-10-09 NOTE — ED Provider Notes (Addendum)
 [facility] Provider Note    Event Date/Time   First MD Initiated Contact with Patient 10/09/23 1555     (approximate)   History   Back Pain and Leg Pain    HPI  Christopher Mason is a 63 y.o. male    with a past medical history of lumbar surgery, lumbar radiculopathy, lumbar spondylosis, stage III severe COPD, primary hypertension, GERD, schizophrenia, iron  deficiency, IBS, tardive dyskinesia, vitamin D  deficiency, bilateral leg pain, postpolio syndrome, who presents to the ED complaining of bilateral leg pain. According to the patient he has chronic lower back pain that is getting worse in the last 3 weeks.  Patient was seen today in the neurosurgery clinic, they ordered MRI.  Patient is taking gabapentin  for pain without relief.  Patient denies urinary incontinence, fecal incontinence, urinary retention.  Patient states having dysuria.  Last bowel movement 2 days ago.  Patient states he is using respiratory treatments at home 4 times per day last respiratory treatment was yesterday.   Per independent chart review patient has history of multiple back surgeries, last was left L3-L4 microdiscectomy by Dr. Mont Antis on 09/06/2022 Patient Active Problem List   Diagnosis Date Noted   Acute bilateral low back pain with bilateral sciatica 09/27/2023   Stage 3 severe COPD by GOLD classification (HCC) 07/11/2023   Personal history of poliomyelitis 05/26/2023   Iron  deficiency anemia 09/20/2022   Pneumonia of left lower lobe due to infectious organism 09/13/2022   Atelectasis of both lungs 09/08/2022   Lumbar radiculopathy 09/06/2022   Obesity (BMI 30-39.9) 09/05/2022   Constipation 09/02/2022   Anasarca 08/30/2022   Chronic respiratory failure with hypoxia and hypercapnia (HCC) 08/30/2022   Lumbar radiculopathy, acute 08/17/2022   Left leg weakness 08/16/2022   History of colon polyps    Cigarette nicotine  dependence without complication 10/31/2017   Chronic  pain of left knee 10/10/2017   Lower extremity edema 10/10/2017   Recurrent left inguinal hernia 10/10/2017   Schizophrenia (HCC) 07/03/2017   HLD (hyperlipidemia) 02/07/2015   COPD, group C, by GOLD 2017 classification (HCC) 02/07/2015   AKI (acute kidney injury) (HCC) 11/20/2014   Acquired hypothyroidism 09/17/2014   GERD (gastroesophageal reflux disease) 09/17/2014   Tobacco use disorder 09/17/2014   HTN (hypertension) 09/16/2014   Tardive dyskinesia 09/16/2014     ROS: Patient currently denies any vision changes, tinnitus, difficulty speaking, facial droop, sore throat, chest pain, shortness of breath, abdominal pain, nausea/vomiting/diarrhea, or weakness/numbness/paresthesias in any extremity   Physical Exam   Triage Vital Signs: ED Triage Vitals  Encounter Vitals Group     BP 10/09/23 1540 (!) 110/93     Girls Systolic BP Percentile --      Girls Diastolic BP Percentile --      Boys Systolic BP Percentile --      Boys Diastolic BP Percentile --      Pulse Rate 10/09/23 1540 94     Resp 10/09/23 1540 17     Temp 10/09/23 1540 98.1 F (36.7 C)     Temp Source 10/09/23 1540 Oral     SpO2 10/09/23 1540 91 %     Weight --      Height --      Head Circumference --      Peak Flow --      Pain Score 10/09/23 1541 10     Pain Loc --      Pain Education --      Exclude  from Growth Chart --     Most recent vital signs: Vitals:   10/09/23 2112 10/09/23 2318  BP: 121/87 130/80  Pulse: 71 71  Resp: 18 (!) 21  Temp:    SpO2: 94% 93%     Physical Exam Vitals and nursing note reviewed.  During triage patient was hypertensive, saturations 91% Constitutional:      General: Awake and alert.  Patient in acute distress due to lumbar pain  and leg pain.  Patient is using oxygen 4L  by nasal cannula.  Mild dehydrated    Appearance: Normal appearance.      Able to speak in complete sentences without cough or dyspnea  HENT:     Head: Normocephalic and atraumatic.     Mouth:  Mucous membranes are dry Eyes:     General: PERRL. Normal EOMs        Right eye: No discharge.        Left eye: No discharge.     Conjunctiva/sclera: Conjunctivae normal.  Nose No congestion/rhinnorhea. Neck Painless ROM. Supple. No JVD, nodes, thyromegaly  Cardiovascular:     Rate and Rhythm: Normal rate and regular rhythm. No murmurs, gallops, rubs     Pulses: Normal pulses.  Pulmonary:     Effort: Pulmonary effort is normal. No respiratory distress   Breath sounds: Bilateral expiratory wheezing Abdominal:  Skin: Presence of old scar in the left lower quadrant, bowel sounds positive, abdomen is soft. There is no abdominal tenderness. No rebound or guarding. No distention.   Musculoskeletal:        General: No swelling.  Skin:    General: Skin is warm and dry.     Capillary Refill: Capillary refill takes less than 2 seconds.     Findings: No rash.  Neurological:     Mental Status: The patient is awake and alert. MAE spontaneously. No gross focal neurologic deficits are appreciated.     Awake, alert, oriented to person, place, and time.  Speech is clear and fluent. Fund of knowledge is appropriate.    Cranial Nerves: Pupils equal round and reactive to light.  Facial tone is symmetric.     Mild lower posterior lumbar tenderness.    No abnormal lesions on exposed skin.      Side Iliopsoas Quads Hamstring PF DF EHL  R 5 5 5 5 5 5   L 5 5 5 5 5 5       Clonus is not present.   Bilateral upper and lower extremity sensation is intact to light touch.  No signs of saddle anesthesia    Lumbar spine: Spinal process tender to palpation.   Patient has positive SLR on left and right with pain.    Ambulation not tested, patient is in bed with intense pain.    Psychiatric Mood and affect are normal. Speech and behavior are normal.  ED Results / Procedures / Treatments   Labs (all labs ordered are listed, but only abnormal results are displayed) Labs Reviewed  URINALYSIS,  ROUTINE W REFLEX MICROSCOPIC - Abnormal; Notable for the following components:      Result Value   Color, Urine YELLOW (*)    APPearance CLEAR (*)    All other components within normal limits  COMPREHENSIVE METABOLIC PANEL WITH GFR - Abnormal; Notable for the following components:   Potassium 3.2 (*)    Chloride 94 (*)    Glucose, Bld 117 (*)    Calcium  8.8 (*)    All other components within  normal limits  CBC WITH DIFFERENTIAL/PLATELET - Abnormal; Notable for the following components:   WBC 10.7 (*)    RBC 4.13 (*)    Hemoglobin 11.7 (*)    HCT 37.8 (*)    Neutro Abs 8.0 (*)    All other components within normal limits     EKG See physician read    RADIOLOGY I independently reviewed and interpreted imaging and agree with radiologists findings.      PROCEDURES:  Critical Care performed:   Procedures   MEDICATIONS ORDERED IN ED: Medications  HYDROmorphone  (DILAUDID ) injection 1 mg (has no administration in time range)  ipratropium-albuterol  (DUONEB) 0.5-2.5 (3) MG/3ML nebulizer solution 3 mL (3 mLs Nebulization Given 10/09/23 1656)  morphine  (PF) 4 MG/ML injection 4 mg (4 mg Intravenous Given 10/09/23 1654)  ondansetron  (ZOFRAN ) injection 4 mg (4 mg Intravenous Given 10/09/23 1654)  dexamethasone  (DECADRON ) injection 10 mg (10 mg Intravenous Given 10/09/23 1741)   Clinical Course as of 10/09/23 2324  Tue Oct 09, 2023  1647 Updated patient about MRI.  [AE]  1735 Comprehensive metabolic panel(!) Hypokalemia 3.2.  Renal function, liver function within normal limits [AE]  1735 CBC with Differential(!) White blood cells elevated 10.7, anemia hemoglobin 11.7, neutrophils elevated 8.0 [AE]  1735 Reassessed the patient after breathing treatment with resolution of the wheezing [AE]  1751 Urinalysis, Routine w reflex microscopic -Urine, Clean Catch(!) Within normal limits [AE]  2318 Patient was transferred to bed 18, per nurse patient patient rated pain 9/10, ordered with  Dilaudid . [AE]    Clinical Course User Index [AE] Awilda Lennox, PA-C    IMPRESSION / MDM / ASSESSMENT AND PLAN / ED COURSE  I reviewed the triage vital signs and the nursing notes.  Differential diagnosis includes, but is not limited to, bilateral radiculopathy, muscle strain, herniated disc, urinary infection, COPD  Patient's presentation is most consistent with acute complicated illness / injury requiring diagnostic workup.   Christopher Mason is a 63 y.o., male who presents today with chronic lumbar pain that is getting worse in the last 3 weeks.  Patient was seen today by neurosurgery service who ordered an MRI.  Patient states he has been bedridden  due to pain,he is using oxygen with nasal cannula( 4L), and complaining of dysuria.  At physical exam patient has expiratory wheezing, tenderness to palpation in the lumbar spinal process, no signs of saddle anesthesia .  Ordered UA, CBC CMP, breathing treatment, Zofran  and morphine .  Consulted Dr. Cleora Daft who authorized lumbar MRI w/o contrast.  CBC, white blood cells elevated 10.7, anemia with hemoglobin of 11.7, neutrophilia with neutrophils of 8.0.  CMP, hypokalemia 3.2, urinalysis negative for UTI. Expiratory wheezing resolved with breathing treatment.  Patient received morphine , Zofran , dexamethasone , Dilaudid , lidocaine  patch for pain control.  Waiting for MRI report to determine disposition. Discussed plan of care with patient, answered all of patient's questions, Patient agreeable to plan of care. Patient verbalized understanding. Patient was transferred to bed 18, waiting for MRI results.  I am transferring care to Dr. Author Board.  Pending MRI results to determine disposition.    FINAL CLINICAL IMPRESSION(S) / ED DIAGNOSES   Final diagnoses:  Lumbar back pain     Rx / DC Orders   ED Discharge Orders     None        Note:  This document was prepared using Dragon voice recognition software and may include unintentional  dictation errors.   Awilda Lennox, PA-C 10/09/23 2316    Luster Salters,  Jennelle Mocha, PA-C 10/09/23 2324    Awilda Lennox, PA-C 10/09/23 2330    Twilla Galea, MD 10/12/23 [phone]

## 2023-10-09 NOTE — Patient Instructions (Signed)
 It was so nice to see you today. Thank you so much for coming in.    I am sorry that you are hurting.   I want to get an MRI of your lower back to look into things further. We will get this approved through your insurance and [facility] will call you to schedule the appointment. Ask about your patient responsibility. You do not need to pay this prior to getting MRI, they can bill you.  After you have the MRI, it takes 14-21 days for me to get the results back. Once I have them, we will call you to schedule a follow up phone visit with me to review them.   Please do not hesitate to call if you have any questions or concerns. You can also message me in MyChart.   Lucetta Russel PA-C [phone]     The physicians and staff at [facility] at [facility] are committed to providing excellent care. You may receive a survey asking for feedback about your experience at our office. We value you your feedback and appreciate you taking the time to to fill it out. The [facility] leadership team is also available to discuss your experience in person, feel free to contact us  [phone].

## 2023-10-09 NOTE — ED Notes (Addendum)
 Christopher Mason

## 2023-10-09 NOTE — ED Triage Notes (Signed)
 Pt to ED via [facility] from home. Pt reports chronic back pain but worse x3 wks. Pt with lumbar radiculopathy and spondylosis. Pt was seen at PCP today and would schedule for MRI. Pt reports called EMS due to increased leg pain.   EMS: BP 99/78 87 HR 92% on 4L [place] chronic from COPD

## 2023-10-09 NOTE — ED Provider Notes (Signed)
 11:30 PM  Assumed care at shift change.  Patient seen by neurosurgery today.  Had outpatient MRI scheduled.  Complained of worsening pain.  MRI of the lumbar spine without contrast pending.  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  Patient reports pain is poorly controlled, difficulty ambulating.    Consulted and discussed patient's case with hospitalist, Dr. Achilles Holes.  I have recommended admission and consulting physician agrees and will place admission orders.  Patient (and family if present) agree with this plan.   I reviewed all nursing notes, vitals, pertinent previous records.  All labs, EKGs, imaging ordered have been independently reviewed and interpreted by myself.  Secure chat message also sent to Dr. Mont Antis with neurosurgery to see patient consultation later today.   Birgitta Uhlir, Clover Dao, DO 10/10/23 [phone]

## 2023-10-10 ENCOUNTER — Observation Stay

## 2023-10-10 DIAGNOSIS — M549 Dorsalgia, unspecified: Secondary | ICD-10-CM

## 2023-10-10 DIAGNOSIS — E039 Hypothyroidism, unspecified: Secondary | ICD-10-CM | POA: Insufficient documentation

## 2023-10-10 DIAGNOSIS — E785 Hyperlipidemia, unspecified: Secondary | ICD-10-CM

## 2023-10-10 DIAGNOSIS — E876 Hypokalemia: Secondary | ICD-10-CM

## 2023-10-10 DIAGNOSIS — I10 Essential (primary) hypertension: Secondary | ICD-10-CM

## 2023-10-10 DIAGNOSIS — J449 Chronic obstructive pulmonary disease, unspecified: Secondary | ICD-10-CM | POA: Insufficient documentation

## 2023-10-10 DIAGNOSIS — K219 Gastro-esophageal reflux disease without esophagitis: Secondary | ICD-10-CM | POA: Insufficient documentation

## 2023-10-10 LAB — BASIC METABOLIC PANEL WITH GFR
Anion gap: 11 (ref 5–15)
BUN: 21 mg/dL (ref 8–23)
CO2: 29 mmol/L (ref 22–32)
Calcium: 8.6 mg/dL — ABNORMAL LOW (ref 8.9–10.3)
Chloride: 96 mmol/L — ABNORMAL LOW (ref 98–111)
Creatinine, Ser: 1.05 mg/dL (ref 0.61–1.24)
GFR, Estimated: >60 mL/min (ref >60)
Glucose, Bld: 173 mg/dL — ABNORMAL HIGH (ref 70–99)
Potassium: 4 mmol/L (ref 3.5–5.1)
Sodium: 136 mmol/L (ref 135–145)

## 2023-10-10 LAB — BLOOD GAS, VENOUS
Acid-Base Excess: 9.7 mmol/L — ABNORMAL HIGH (ref 0.0–2.0)
Bicarbonate: 37.3 mmol/L — ABNORMAL HIGH (ref 20.0–28.0)
O2 Saturation: 88.3 %
Patient temperature: 37
pCO2, Ven: 63 mmHg — ABNORMAL HIGH (ref 44–60)
pH, Ven: 7.38 (ref 7.25–7.43)
pO2, Ven: 53 mmHg — ABNORMAL HIGH (ref 32–45)

## 2023-10-10 LAB — CBC
HCT: 35.4 % — ABNORMAL LOW (ref 39.0–52.0)
Hemoglobin: 10.9 g/dL — ABNORMAL LOW (ref 13.0–17.0)
MCH: 28.5 pg (ref 26.0–34.0)
MCHC: 30.8 g/dL (ref 30.0–36.0)
MCV: 92.4 fL (ref 80.0–100.0)
Platelets: 193 10*3/uL (ref 150–400)
RBC: 3.83 MIL/uL — ABNORMAL LOW (ref 4.22–5.81)
RDW: 15.4 % (ref 11.5–15.5)
WBC: 11.5 10*3/uL — ABNORMAL HIGH (ref 4.0–10.5)
nRBC: 0.3 % — ABNORMAL HIGH (ref 0.0–0.2)

## 2023-10-10 LAB — D-DIMER, QUANTITATIVE: D-Dimer, Quant: 0.38 ug{FEU}/mL (ref 0.00–0.50)

## 2023-10-10 LAB — BRAIN NATRIURETIC PEPTIDE: B Natriuretic Peptide: 95.2 pg/mL (ref 0.0–100.0)

## 2023-10-10 MED ORDER — LEVOTHYROXINE SODIUM 50 MCG PO TABS
150.0000 ug | ORAL_TABLET | Freq: Every day | ORAL | Status: DC
  Administered 2023-10-10 – 2023-10-14 (×5): 150 ug via ORAL
  Filled 2023-10-10: qty 1
  Filled 2023-10-10 (×4): qty 3

## 2023-10-10 MED ORDER — TRAZODONE HCL 50 MG PO TABS: 25.0000 mg | ORAL_TABLET | Freq: Every evening | ORAL | Status: DC | PRN

## 2023-10-10 MED ORDER — MENTHOL 3 MG MT LOZG
1.0000 | LOZENGE | OROMUCOSAL | Status: DC | PRN
  Administered 2023-10-10 – 2023-10-12 (×4): 3 mg via ORAL
  Filled 2023-10-10: qty 9

## 2023-10-10 MED ORDER — SODIUM CHLORIDE 0.9 % IV SOLN
INTRAVENOUS | Status: DC
Start: 2023-10-10 — End: 2023-10-10

## 2023-10-10 MED ORDER — TRAZODONE HCL 100 MG PO TABS
100.0000 mg | ORAL_TABLET | Freq: Every day | ORAL | Status: DC
  Administered 2023-10-10 – 2023-10-11 (×3): 100 mg via ORAL
  Filled 2023-10-10 (×3): qty 1

## 2023-10-10 MED ORDER — BUDESONIDE 0.5 MG/2ML IN SUSP
0.5000 mg | Freq: Two times a day (BID) | RESPIRATORY_TRACT | Status: DC
  Administered 2023-10-10 (×2): 0.5 mg via RESPIRATORY_TRACT
  Filled 2023-10-10 (×2): qty 2

## 2023-10-10 MED ORDER — IPRATROPIUM-ALBUTEROL 0.5-2.5 (3) MG/3ML IN SOLN
3.0000 mL | Freq: Four times a day (QID) | RESPIRATORY_TRACT | Status: DC
  Administered 2023-10-10 – 2023-10-14 (×14): 3 mL via RESPIRATORY_TRACT
  Filled 2023-10-10 (×14): qty 3

## 2023-10-10 MED ORDER — BISACODYL 5 MG PO TBEC
10.0000 mg | DELAYED_RELEASE_TABLET | Freq: Every day | ORAL | Status: DC
  Administered 2023-10-10 – 2023-10-13 (×5): 10 mg via ORAL
  Filled 2023-10-10 (×5): qty 2

## 2023-10-10 MED ORDER — HYDROXYZINE HCL 10 MG PO TABS
10.0000 mg | ORAL_TABLET | Freq: Three times a day (TID) | ORAL | Status: DC | PRN
  Administered 2023-10-11 – 2023-10-12 (×2): 10 mg via ORAL
  Filled 2023-10-10 (×3): qty 1

## 2023-10-10 MED ORDER — DOCUSATE SODIUM 50 MG PO CAPS: 50.0000 mg | ORAL_CAPSULE | Freq: Every day | ORAL | Status: DC

## 2023-10-10 MED ORDER — VENLAFAXINE HCL 37.5 MG PO TABS
100.0000 mg | ORAL_TABLET | Freq: Two times a day (BID) | ORAL | Status: DC
  Administered 2023-10-10 – 2023-10-14 (×10): 100 mg via ORAL
  Filled 2023-10-10 (×10): qty 1

## 2023-10-10 MED ORDER — PANTOPRAZOLE SODIUM 40 MG PO TBEC
40.0000 mg | DELAYED_RELEASE_TABLET | Freq: Every day | ORAL | Status: DC
  Administered 2023-10-10 – 2023-10-14 (×5): 40 mg via ORAL
  Filled 2023-10-10 (×5): qty 1

## 2023-10-10 MED ORDER — DOCUSATE SODIUM 50 MG/5ML PO LIQD
50.0000 mg | Freq: Every day | ORAL | Status: DC
  Administered 2023-10-10 – 2023-10-13 (×3): 50 mg via ORAL
  Filled 2023-10-10 (×5): qty 10

## 2023-10-10 MED ORDER — TORSEMIDE 20 MG PO TABS
40.0000 mg | ORAL_TABLET | Freq: Every day | ORAL | Status: DC
  Administered 2023-10-10 – 2023-10-14 (×5): 40 mg via ORAL
  Filled 2023-10-10 (×5): qty 2

## 2023-10-10 MED ORDER — ADULT MULTIVITAMIN W/MINERALS CH
1.0000 | ORAL_TABLET | Freq: Every day | ORAL | Status: DC
  Administered 2023-10-10 – 2023-10-14 (×5): 1 via ORAL
  Filled 2023-10-10 (×5): qty 1

## 2023-10-10 MED ORDER — ENOXAPARIN SODIUM 80 MG/0.8ML IJ SOSY
0.5000 mg/kg | PREFILLED_SYRINGE | INTRAMUSCULAR | Status: DC
  Administered 2023-10-10 – 2023-10-13 (×4): 62.5 mg via SUBCUTANEOUS
  Filled 2023-10-10 (×6): qty 0.63

## 2023-10-10 MED ORDER — GABAPENTIN 300 MG PO CAPS
300.0000 mg | ORAL_CAPSULE | Freq: Three times a day (TID) | ORAL | Status: DC
  Administered 2023-10-10 – 2023-10-12 (×7): 300 mg via ORAL
  Filled 2023-10-10 (×7): qty 1

## 2023-10-10 MED ORDER — ACETAMINOPHEN 650 MG RE SUPP: 650.0000 mg | Freq: Four times a day (QID) | RECTAL | Status: DC | PRN

## 2023-10-10 MED ORDER — IPRATROPIUM-ALBUTEROL 0.5-2.5 (3) MG/3ML IN SOLN
3.0000 mL | Freq: Four times a day (QID) | RESPIRATORY_TRACT | Status: DC
  Administered 2023-10-10 (×2): 3 mL via RESPIRATORY_TRACT
  Filled 2023-10-10 (×2): qty 3

## 2023-10-10 MED ORDER — BUDESONIDE 0.5 MG/2ML IN SUSP
0.5000 mg | Freq: Two times a day (BID) | RESPIRATORY_TRACT | Status: DC
  Administered 2023-10-10 – 2023-10-14 (×8): 0.5 mg via RESPIRATORY_TRACT
  Filled 2023-10-10 (×8): qty 2

## 2023-10-10 MED ORDER — VITAMIN D (ERGOCALCIFEROL) 1.25 MG (50000 UNIT) PO CAPS
50000.0000 [IU] | ORAL_CAPSULE | ORAL | Status: DC
  Administered 2023-10-14: 50000 [IU] via ORAL
  Filled 2023-10-10: qty 1

## 2023-10-10 MED ORDER — ATORVASTATIN CALCIUM 20 MG PO TABS
40.0000 mg | ORAL_TABLET | Freq: Every day | ORAL | Status: DC
  Administered 2023-10-10 – 2023-10-14 (×5): 40 mg via ORAL
  Filled 2023-10-10 (×5): qty 2

## 2023-10-10 MED ORDER — IPRATROPIUM-ALBUTEROL 0.5-2.5 (3) MG/3ML IN SOLN: 3.0000 mL | RESPIRATORY_TRACT | Status: DC | PRN

## 2023-10-10 MED ORDER — AMITRIPTYLINE HCL 25 MG PO TABS
25.0000 mg | ORAL_TABLET | Freq: Every day | ORAL | Status: DC
  Administered 2023-10-10 – 2023-10-13 (×4): 25 mg via ORAL
  Filled 2023-10-10 (×4): qty 1

## 2023-10-10 MED ORDER — ONDANSETRON HCL 4 MG PO TABS: 4.0000 mg | ORAL_TABLET | Freq: Four times a day (QID) | ORAL | Status: DC | PRN

## 2023-10-10 MED ORDER — OXYCODONE HCL 5 MG PO TABS
5.0000 mg | ORAL_TABLET | Freq: Four times a day (QID) | ORAL | Status: DC | PRN
  Administered 2023-10-10 – 2023-10-12 (×3): 5 mg via ORAL
  Filled 2023-10-10 (×3): qty 1

## 2023-10-10 MED ORDER — FUROSEMIDE 10 MG/ML IJ SOLN
80.0000 mg | Freq: Once | INTRAMUSCULAR | Status: AC
  Administered 2023-10-10: 80 mg via INTRAVENOUS
  Filled 2023-10-10: qty 8

## 2023-10-10 MED ORDER — POLYETHYLENE GLYCOL 3350 17 G PO PACK: 17.0000 g | PACK | Freq: Every day | ORAL | Status: DC | PRN

## 2023-10-10 MED ORDER — AMLODIPINE BESYLATE 10 MG PO TABS
10.0000 mg | ORAL_TABLET | Freq: Every day | ORAL | Status: DC
  Administered 2023-10-10 – 2023-10-14 (×5): 10 mg via ORAL
  Filled 2023-10-10 (×4): qty 1
  Filled 2023-10-10: qty 2

## 2023-10-10 MED ORDER — ONDANSETRON HCL 4 MG/2ML IJ SOLN: 4.0000 mg | Freq: Four times a day (QID) | INTRAMUSCULAR | Status: DC | PRN

## 2023-10-10 MED ORDER — ACETAMINOPHEN 325 MG PO TABS: 650.0000 mg | ORAL_TABLET | Freq: Four times a day (QID) | ORAL | Status: DC | PRN

## 2023-10-10 NOTE — ED Notes (Signed)
Pt given urinal per request.

## 2023-10-10 NOTE — Assessment & Plan Note (Signed)
-   This is associated with the following MRI findings indicating associated radiculopathy: 1. At L3-L4, left hemilaminectomy with suspected persistent left subarticular/foraminal disc protrusion and posterior displacement of the descending left L4 nerve roots. Presumed scar tissue also in this region effaces the left foramen. Postcontrast imaging may help further differentiate scar tissue from residual/recurrent disc if clinically warranted. 2. At L5-S1, prior laminectomy with similar severe right and moderate left foraminal stenosis. 3. Milder degenerative changes at the other levels, as detailed above.  - Pain management to be provided. - PT consult to be obtained. - Neurosurgery consult to be obtained. - I notified Dr. Mont Antis about the patient.
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
## 2023-10-10 NOTE — Assessment & Plan Note (Signed)
-   Will continue with his bronchodilator therapy.

## 2023-10-10 NOTE — Progress Notes (Signed)
 PT Cancellation Note  Patient Details Name: Christopher Mason MRN: [id number] DOB: [birth year]   Cancelled Treatment:    Reason Eval/Treat Not Completed: Medical issues which prohibited therapy (Per chart, neurosurgery has been consulted, still pending. Will wait for workup compltion, defer PT evaluation to later date and/or time.)  11:13 AM, 10/10/23 [name], PT, DPT Physical Therapist - [facility]  [phone] (ASCOM)    [name] 10/10/2023, 11:13 AM

## 2023-10-10 NOTE — Assessment & Plan Note (Signed)
 Will continue statin therapy

## 2023-10-10 NOTE — Progress Notes (Signed)
 OT Cancellation Note  Patient Details Name: Joh Rao MRN: [id number] DOB: 1960-09-22   Cancelled Treatment:    Reason Eval/Treat Not Completed: Other (comment) (per chart ,pt is pending neurosurgery consult, will defer OT eval until further treatment plan is established. OT will follow up as able.Gerre Kraft, OTD OTR/L  10/10/23, 1:19 PM
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
## 2023-10-10 NOTE — Progress Notes (Signed)
 Anticoagulation monitoring(Lovenox ):  63 yo male ordered Lovenox  40 mg Q24h    Filed Weights   10/10/23 0154  Weight: 125.3 kg (276 lb 3.2 oz)   BMI 43.3    Lab Results  Component Value Date   CREATININE 0.99 10/09/2023   CREATININE 0.83 07/16/2023   CREATININE 0.91 07/15/2023   Estimated Creatinine Clearance: 97 mL/min (by C-G formula based on SCr of 0.99 mg/dL). Hemoglobin & Hematocrit     Component Value Date/Time   HGB 11.7 (L) 10/09/2023 1657   HGB 13.8 03/19/2023 1431   HCT 37.8 (L) 10/09/2023 1657   HCT 42.7 03/19/2023 1431     Per Protocol for Patient with estCrcl > 30 ml/min and BMI > 30, will transition to Lovenox  62.5 mg Q24h.

## 2023-10-10 NOTE — Assessment & Plan Note (Signed)
Will continue antihypertensive therapy.

## 2023-10-10 NOTE — Assessment & Plan Note (Signed)
 Will continue PPI therapy.

## 2023-10-10 NOTE — Assessment & Plan Note (Addendum)
-   We will replace potassium and check magnesium level.

## 2023-10-10 NOTE — Assessment & Plan Note (Signed)
-   Will continue Synthroid.

## 2023-10-10 NOTE — Progress Notes (Signed)
 Interim progress note not for billing.  I have seen and examined the patient, reviewed the chart, and agree with the H and P as outlined. Pending neurosurgery eval today.

## 2023-10-11 ENCOUNTER — Inpatient Hospital Stay

## 2023-10-11 DIAGNOSIS — I110 Hypertensive heart disease with heart failure: Secondary | ICD-10-CM | POA: Diagnosis present

## 2023-10-11 DIAGNOSIS — Z833 Family history of diabetes mellitus: Secondary | ICD-10-CM | POA: Diagnosis not present

## 2023-10-11 DIAGNOSIS — J9622 Acute and chronic respiratory failure with hypercapnia: Secondary | ICD-10-CM | POA: Diagnosis not present

## 2023-10-11 DIAGNOSIS — G9349 Other encephalopathy: Secondary | ICD-10-CM | POA: Diagnosis not present

## 2023-10-11 DIAGNOSIS — I5030 Unspecified diastolic (congestive) heart failure: Secondary | ICD-10-CM | POA: Diagnosis present

## 2023-10-11 DIAGNOSIS — D649 Anemia, unspecified: Secondary | ICD-10-CM | POA: Diagnosis present

## 2023-10-11 DIAGNOSIS — F319 Bipolar disorder, unspecified: Secondary | ICD-10-CM | POA: Diagnosis present

## 2023-10-11 DIAGNOSIS — E039 Hypothyroidism, unspecified: Secondary | ICD-10-CM | POA: Diagnosis present

## 2023-10-11 DIAGNOSIS — J441 Chronic obstructive pulmonary disease with (acute) exacerbation: Secondary | ICD-10-CM | POA: Diagnosis not present

## 2023-10-11 DIAGNOSIS — Z79899 Other long term (current) drug therapy: Secondary | ICD-10-CM | POA: Diagnosis not present

## 2023-10-11 DIAGNOSIS — M5116 Intervertebral disc disorders with radiculopathy, lumbar region: Secondary | ICD-10-CM | POA: Diagnosis present

## 2023-10-11 DIAGNOSIS — M549 Dorsalgia, unspecified: Secondary | ICD-10-CM | POA: Diagnosis not present

## 2023-10-11 DIAGNOSIS — F209 Schizophrenia, unspecified: Secondary | ICD-10-CM | POA: Diagnosis present

## 2023-10-11 DIAGNOSIS — Z8249 Family history of ischemic heart disease and other diseases of the circulatory system: Secondary | ICD-10-CM | POA: Diagnosis not present

## 2023-10-11 DIAGNOSIS — G14 Postpolio syndrome: Secondary | ICD-10-CM | POA: Diagnosis present

## 2023-10-11 DIAGNOSIS — M545 Low back pain, unspecified: Secondary | ICD-10-CM | POA: Diagnosis present

## 2023-10-11 DIAGNOSIS — K219 Gastro-esophageal reflux disease without esophagitis: Secondary | ICD-10-CM | POA: Diagnosis present

## 2023-10-11 DIAGNOSIS — Z79890 Hormone replacement therapy (postmenopausal): Secondary | ICD-10-CM | POA: Diagnosis not present

## 2023-10-11 DIAGNOSIS — F17210 Nicotine dependence, cigarettes, uncomplicated: Secondary | ICD-10-CM | POA: Diagnosis present

## 2023-10-11 DIAGNOSIS — Z7951 Long term (current) use of inhaled steroids: Secondary | ICD-10-CM | POA: Diagnosis not present

## 2023-10-11 DIAGNOSIS — E876 Hypokalemia: Secondary | ICD-10-CM | POA: Diagnosis present

## 2023-10-11 DIAGNOSIS — G8929 Other chronic pain: Secondary | ICD-10-CM | POA: Diagnosis present

## 2023-10-11 DIAGNOSIS — E785 Hyperlipidemia, unspecified: Secondary | ICD-10-CM | POA: Diagnosis present

## 2023-10-11 DIAGNOSIS — J9621 Acute and chronic respiratory failure with hypoxia: Secondary | ICD-10-CM | POA: Diagnosis not present

## 2023-10-11 DIAGNOSIS — G4700 Insomnia, unspecified: Secondary | ICD-10-CM | POA: Diagnosis present

## 2023-10-11 LAB — BLOOD GAS, VENOUS
Acid-Base Excess: 11.6 mmol/L — ABNORMAL HIGH (ref 0.0–2.0)
Bicarbonate: 39.9 mmol/L — ABNORMAL HIGH (ref 20.0–28.0)
O2 Saturation: 75.8 %
Patient temperature: 37
pCO2, Ven: 69 mmHg — ABNORMAL HIGH (ref 44–60)
pH, Ven: 7.37 (ref 7.25–7.43)
pO2, Ven: 43 mmHg (ref 32–45)

## 2023-10-11 LAB — EXPECTORATED SPUTUM ASSESSMENT W GRAM STAIN, RFLX TO RESP C

## 2023-10-11 LAB — RESPIRATORY PANEL BY PCR

## 2023-10-11 LAB — BASIC METABOLIC PANEL WITH GFR
Anion gap: 11 (ref 5–15)
BUN: 23 mg/dL (ref 8–23)
CO2: 33 mmol/L — ABNORMAL HIGH (ref 22–32)
Calcium: 9 mg/dL (ref 8.9–10.3)
Chloride: 97 mmol/L — ABNORMAL LOW (ref 98–111)
Creatinine, Ser: 0.8 mg/dL (ref 0.61–1.24)
GFR, Estimated: >60 mL/min
Glucose, Bld: 141 mg/dL — ABNORMAL HIGH (ref 70–99)
Potassium: 3.3 mmol/L — ABNORMAL LOW (ref 3.5–5.1)
Sodium: 141 mmol/L (ref 135–145)

## 2023-10-11 LAB — RESP PANEL BY RT-PCR (RSV, FLU A&B, COVID)  RVPGX2
Influenza A by PCR: NEGATIVE
Influenza B by PCR: NEGATIVE
Resp Syncytial Virus by PCR: NEGATIVE
SARS Coronavirus 2 by RT PCR: NEGATIVE

## 2023-10-11 MED ORDER — ALUM & MAG HYDROXIDE-SIMETH 200-200-20 MG/5ML PO SUSP
30.0000 mL | Freq: Once | ORAL | Status: AC
  Administered 2023-10-11: 30 mL via ORAL
  Filled 2023-10-11: qty 30

## 2023-10-11 MED ORDER — POLYETHYLENE GLYCOL 3350 17 G PO PACK
34.0000 g | PACK | Freq: Every day | ORAL | Status: DC
  Administered 2023-10-11 – 2023-10-13 (×2): 34 g via ORAL
  Filled 2023-10-11 (×3): qty 2

## 2023-10-11 MED ORDER — AZITHROMYCIN 500 MG PO TABS
500.0000 mg | ORAL_TABLET | Freq: Every day | ORAL | Status: AC
  Administered 2023-10-11: 500 mg via ORAL
  Filled 2023-10-11: qty 1

## 2023-10-11 MED ORDER — AZITHROMYCIN 250 MG PO TABS
250.0000 mg | ORAL_TABLET | Freq: Every day | ORAL | Status: DC
  Administered 2023-10-12 – 2023-10-14 (×3): 250 mg via ORAL
  Filled 2023-10-11 (×3): qty 1

## 2023-10-11 MED ORDER — PREDNISONE 20 MG PO TABS
40.0000 mg | ORAL_TABLET | Freq: Every day | ORAL | Status: DC
  Administered 2023-10-11 – 2023-10-14 (×4): 40 mg via ORAL
  Filled 2023-10-11 (×4): qty 2

## 2023-10-11 NOTE — Progress Notes (Signed)
 OT Cancellation Note  Patient Details Name: Larrie Fraizer MRN: [id number] DOB: December 17, 1960   Cancelled Treatment:    Reason Eval/Treat Not Completed: Medical issues which prohibited therapy. Orders received, chart reviewed. Pt currently with increasing O2 needs at this time and is on BiPAP. OT will see when pt able to participate in OT.   [facility]ridget[facility] L. Kasem Mozer, OTR/L  10/11/23, 12:06 PM

## 2023-10-11 NOTE — Plan of Care (Signed)

## 2023-10-11 NOTE — Evaluation (Signed)
 Physical Therapy Evaluation Patient Details Name: Christopher Mason MRN: [id number] DOB: 1961-03-29 Today's Date: 10/11/2023  History of Present Illness  Christopher Mason is a 62yoM who comes to [facility] with acute on chronic radicular leg pain and difficulty walking x3 days. PMH: COPD, schizophrenia, spinal surgery, hypoTSH, BPD, tardive dyskinesia, multilevel lumbar spondylosis s/p microdiscectomy 09/06/23. Neurosurgery is consulted, does not feel pt to be a surgical candidate at this time. Pt has elevated O2 needs here as well.  Clinical Impression  Author coordinates analgesia prior to session. Pt agreeable to assessment, familiar to author from prior admission, has steadfast motivation to optimize function and hold fast in a status that would allow for direct DC to home returning to canine companion of great affection. Pt on 9L/min HFNC, remains stable at EOB, but after 35ft AMB on 10L drops to <80% SpO2. Pt maintains stability in standing throughout, but he is cautious and conservative due to being extra shaky today. Pt sits EOB for several minutes thereafter to recover breathing and drink water. Will continue to follow and advance AMB as able, pending progress with respiratory status.        If plan is discharge home, recommend the following: Assist for transportation;Assistance with cooking/housework;Help with stairs or ramp for entrance   Can travel by private vehicle        Equipment Recommendations None recommended by PT  Recommendations for Other Services       Functional Status Assessment Patient has had a recent decline in their functional status and demonstrates the ability to make significant improvements in function in a reasonable and predictable amount of time.     Precautions / Restrictions Precautions Precautions: Fall Recall of Precautions/Restrictions: Intact Restrictions Weight Bearing Restrictions Per Provider Order: No      Mobility  Bed Mobility Overal bed  mobility: Needs Assistance Bed Mobility: Supine to Sit, Sit to Supine     Supine to sit: Min assist, HOB elevated Sit to supine: Min assist, HOB elevated   General bed mobility comments: has hospital bed at home, sleeps with [facility] elevated    Transfers Overall transfer level: Needs assistance Equipment used: Rolling walker (2 wheels) Transfers: Sit to/from Stand Sit to Stand: From elevated surface, Supervision           General transfer comment: stands from elevation at home at baseline    Ambulation/Gait Ambulation/Gait assistance: Supervision, Contact guard assist Gait Distance (Feet): 14 Feet Assistive device: Rolling walker (2 wheels)       Pre-gait activities: marching in place with RW x8 General Gait Details: appears steady on feet with RW, but on 10L HFNC drops to ~78% SpO2 with drawnout recovery over 2-3 minutes at 12L.  Stairs            Wheelchair Mobility     Tilt Bed    Modified Rankin (Stroke Patients Only)       Balance Overall balance assessment: Modified Independent                                           Pertinent Vitals/Pain Pain Assessment Pain Assessment: Faces Faces Pain Scale: Hurts little more Pain Location: legs, low back Pain Intervention(s): Premedicated before session    Home Living Family/patient expects to be discharged to:: Private residence Living Arrangements: Alone Available Help at Discharge: Available PRN/intermittently;Personal care attendant (3hours per day, 7 days per week.)  Type of Home: Apartment Home Access: Level entry       Home Layout: One level Home Equipment: Rollator (4 wheels);Grab bars - tub/shower;Rolling Walker (2 wheels) Additional Comments: Pt reports working on getting HHPT and 24/7 care.    Prior Function                       Extremity/Trunk Assessment                Communication        Cognition Arousal: Alert Behavior During Therapy: WFL for  tasks assessed/performed   PT - Cognitive impairments: No apparent impairments                                 Cueing       General Comments      Exercises     Assessment/Plan    PT Assessment Patient needs continued PT services  PT Problem List Decreased activity tolerance;Decreased balance;Decreased mobility;Decreased range of motion;Decreased strength       PT Treatment Interventions Gait training;Functional mobility training;Therapeutic activities;Therapeutic exercise;Balance training    PT Goals (Current goals can be found in the Care Plan section)  Acute Rehab PT Goals Patient Stated Goal: return home to his little dog-friend companion PT Goal Formulation: With patient Time For Goal Achievement: 10/25/23 Potential to Achieve Goals: Good    Frequency Min 3X/week     Co-evaluation               AM-PAC PT 6 Clicks Mobility  Outcome Measure Help needed turning from your back to your side while in a flat bed without using bedrails?: A Lot Help needed moving from lying on your back to sitting on the side of a flat bed without using bedrails?: A Lot Help needed moving to and from a bed to a chair (including a wheelchair)?: A Lot Help needed standing up from a chair using your arms (e.g., wheelchair or bedside chair)?: A Little Help needed to walk in hospital room?: A Little Help needed climbing 3-5 steps with a railing? : A Little 6 Click Score: 15    End of Session Equipment Utilized During Treatment: Oxygen Activity Tolerance: Patient limited by fatigue;Treatment limited secondary to medical complications (Comment) Patient left: in bed;with call bell/phone within reach Nurse Communication: Mobility status;Patient requests pain meds PT Visit Diagnosis: Difficulty in walking, not elsewhere classified (R26.2);Other abnormalities of gait and mobility (R26.89);Muscle weakness (generalized) (M62.81)    Time: 4098-1191 PT Time Calculation (min)  (ACUTE ONLY): 21 min   Charges:   PT Evaluation $PT Eval Moderate Complexity: 1 Mod PT Treatments $Therapeutic Activity: 8-22 mins PT General Charges $$ ACUTE PT VISIT: 1 Visit       4:28 PM, 10/11/23 [name], PT, DPT Physical Therapist - [facility]  [phone] (ASCOM)    Miyah Hampshire C 10/11/2023, 4:22 PM

## 2023-10-11 NOTE — Progress Notes (Signed)
 PROGRESS NOTE    Christopher Mason  WUJ:[id number] DOB: 1961/02/26 DOA: 10/09/2023 PCP: Laurence Pons, NP     Brief Narrative:   From admission h and p  Christopher Mason is a 63 y.o. Caucasian male with medical history significant for multiple back surgeries- last was left L3-L4 microdiscectomy by Dr. Mont Antis on 09/06/22, asthma, bipolar disorder, COPD, depression, GERD, hypertension, dyslipidemia, and IBS as well as schizophrenia, tardive dyskinesia, vitamin D  deficiency, bilateral leg pain, and postpolio syndrome, who presented to the emergency room with acute onset of intractable low back pain with bilateral leg radiation.  It has been going on for a year but has gotten worse over the last 3 weeks.  He was seen in neurosurgery clinic today and was ordered MRI.  He has been taking Neurontin  for pain.  He admits to occasional paresthesias in both lower extremities with tingling and numbness.  He denied any saddle anesthesia, urinary or stool incontinence.  He admitted to mild dysuria without urinary frequency or urgency, hematuria or flank pain.  No cough or wheezing or dyspnea.  No chest pain or palpitations.  No bleeding diathesis.   Assessment & Plan:   Principal Problem:   Intractable back pain Active Problems:   Chronic respiratory failure with hypoxia and hypercapnia (HCC)   Hypokalemia   Essential hypertension   Dyslipidemia   Schizophrenia (HCC)   Cigarette nicotine  dependence without complication   Obesity (BMI 30-39.9)   Lumbar radiculopathy   Hypothyroidism   Chronic obstructive pulmonary disease (COPD) (HCC)   GERD without esophagitis  # Acute on chronic low back pain with radiculopathy # Spinal stenosis Neurosurgery has evaluated, is not a surgical candidate, advises pain control, PT/OT - PT/OT consult pending - oral pain regimen, bowel regimen  # COPD with exacerbation Increased o2 requirement from baseline, says cough is more frequent and productive from  [employer]. CXR nothing focal. Dimer and bnp wnl - start steroids - respiratory swabs - azithromycin   # Acute on chronic hypoxic hypercarbic respiratory failure On 5 liters at home, hypercarbic yesterday requiring bipap overnight now weaned to high flow. Probably has underlying OHS that contributes - HFNC, wean as able - vbg pending - bipap at bedtime for now - treating copd as above - duonebs standing  # HFpEF Vascular congestion seen on CXR, bnp not elevated. Received 80 of IV lasix  yesterday - resume home torsemide  40 daily  # Morbid obesity Noted  # Insomnia - home amitryptyline, trazodone   # HTN Bp appropriate - home amlodipine , atorvastatin   # Hypothyroid - home synthroid    DVT prophylaxis: lovenox  Code Status: full Family Communication: none at bedside. No answer when either contact telephoned today.  Level of care: Med-Surg Status is: Observation    Consultants:  Neurosurgery (signed off)  Procedures: none  Antimicrobials:  azithromycin     Subjective: Reports feeling sleepy, breathing improved  Objective: Vitals:   10/11/23 0709 10/11/23 0731 10/11/23 0757 10/11/23 0759  BP: (!) 129/92   101/74  Pulse: 64  70 (!) 58  Resp:    17  Temp:    98.8 F (37.1 C)  TempSrc:      SpO2: 98% 95% 94% 94%  Weight:      Height:        Intake/Output Summary (Last 24 hours) at 10/11/2023 1033 Last data filed at 10/10/2023 1956 Gross per 24 hour  Intake --  Output 1800 ml  Net -1800 ml   Filed Weights   10/10/23 0154 10/10/23 1256  Weight: 125.3 kg 127.8 kg    Examination:  General exam: Appears calm and comfortable, chronically ill Respiratory system: exp wheeze Cardiovascular system: S1 & S2 heard, RRR. Distant heart sounds Gastrointestinal system: Abdomen is obese, soft and nontender.   Central nervous system: Alert and oriented.   Extremities: Symmetric 5 x 5 power. No edema Skin: No rashes, lesions or ulcers Psychiatry: calm    Data  Reviewed: I have personally reviewed following labs and imaging studies  CBC: Recent Labs  Lab 10/09/23 1657 10/10/23 0323  WBC 10.7* 11.5*  NEUTROABS 8.0*  --   HGB 11.7* 10.9*  HCT 37.8* 35.4*  MCV 91.5 92.4  PLT 188 193   Basic Metabolic Panel: Recent Labs  Lab 10/09/23 1657 10/10/23 0323  NA 137 136  K 3.2* 4.0  CL 94* 96*  CO2 29 29  GLUCOSE 117* 173*  BUN 17 21  CREATININE 0.99 1.05  CALCIUM  8.8* 8.6*   GFR: Estimated Creatinine Clearance: 92.5 mL/min (by C-G formula based on SCr of 1.05 mg/dL). Liver Function Tests: Recent Labs  Lab 10/09/23 1657  AST 21  ALT 16  ALKPHOS 82  BILITOT 0.7  PROT 7.8  ALBUMIN  3.9   No results for input(s): LIPASE, AMYLASE in the last 168 hours. No results for input(s): AMMONIA in the last 168 hours. Coagulation Profile: No results for input(s): INR, PROTIME in the last 168 hours. Cardiac Enzymes: No results for input(s): CKTOTAL, CKMB, CKMBINDEX, TROPONINI in the last 168 hours. BNP (last 3 results) No results for input(s): PROBNP in the last 8760 hours. HbA1C: No results for input(s): HGBA1C in the last 72 hours. CBG: No results for input(s): GLUCAP in the last 168 hours. Lipid Profile: No results for input(s): CHOL, HDL, LDLCALC, TRIG, CHOLHDL, LDLDIRECT in the last 72 hours. Thyroid  Function Tests: No results for input(s): TSH, T4TOTAL, FREET4, T3FREE, THYROIDAB in the last 72 hours. Anemia Panel: No results for input(s): VITAMINB12, FOLATE, FERRITIN, TIBC, IRON , RETICCTPCT in the last 72 hours. Urine analysis:    Component Value Date/Time   COLORURINE YELLOW (A) 10/09/2023 1657   APPEARANCEUR CLEAR (A) 10/09/2023 1657   APPEARANCEUR Clear 03/02/2020 0916   LABSPEC 1.011 10/09/2023 1657   PHURINE 5.0 10/09/2023 1657   GLUCOSEU NEGATIVE 10/09/2023 1657   HGBUR NEGATIVE 10/09/2023 1657   BILIRUBINUR NEGATIVE 10/09/2023 1657   BILIRUBINUR Negative  03/02/2020 0916   KETONESUR NEGATIVE 10/09/2023 1657   PROTEINUR NEGATIVE 10/09/2023 1657   NITRITE NEGATIVE 10/09/2023 1657   LEUKOCYTESUR NEGATIVE 10/09/2023 1657   Sepsis Labs: @LABRCNTIP (procalcitonin:4,lacticidven:4)  )No results found for this or any previous visit (from the past 240 hours).       Radiology Studies: DG Chest Port 1 View Result Date: 10/10/2023 CLINICAL DATA:  Hypoxia EXAM: PORTABLE CHEST 1 VIEW COMPARISON:  07/13/2023 FINDINGS: Single frontal view of the chest demonstrates an enlarged cardiac silhouette. Lung volumes are diminished, with increased central pulmonary vascular congestion. No airspace disease, effusion, or pneumothorax. No acute bony abnormalities. IMPRESSION: 1. Enlarged cardiac silhouette. 2. Pulmonary vascular congestion without overt edema. Electronically Signed   By: Bobbye Burrow M.D.   On: 10/10/2023 18:28   MR Lumbar Spine Wo Contrast Result Date: 10/09/2023 CLINICAL DATA:  Lumbar radiculopathy, symptoms persist with > 6 wks treatment bilateral SLR with pain. EXAM: MRI LUMBAR SPINE WITHOUT CONTRAST TECHNIQUE: Multiplanar, multisequence MR imaging of the lumbar spine was performed. No intravenous contrast was administered. COMPARISON:  MRI lumbar spine April 24, 24. FINDINGS: Segmentation:  Standard. Alignment:  No substantial sagittal subluxation. Vertebrae: No fracture, evidence of discitis, or suspicious bone lesion. Conus medullaris and cauda equina: Conus extends to the L1-L2 level. Conus and cauda equina appear normal. Paraspinal and other soft tissues: Postoperative changes in the posterior paraspinal soft tissues of the lumbar spine. Left renal cyst. Disc levels: T12-L1: No significant disc protrusion, foraminal stenosis, or canal stenosis. L1-L2: Right eccentric disc bulging endplate spurring with mild bilateral foraminal stenosis. Patent canal. L2-L3: Disc bulging and endplate spurring. Bilateral facet arthropathy. Mildly prominent epidural  fat. Mild bilateral foraminal stenosis. Mild canal stenosis. L3-L4: Bilateral facet arthropathy. Disc bulging and endplate spurring. Postoperative changes of left hemilaminectomy with suspected persistent left subarticular/foraminal disc protrusion and posterior displacement of the descending left L4 nerve roots. Presumed scar tissue also in this region complicates evaluation and effaces the left foramen. Mild right foraminal stenosis. L4-L5: Mild disc bulging and facet arthropathy. No significant stenosis. L5-S1: Prior laminectomy. Disc bulging and endplate spurring with bilateral facet arthropathy. Resulting severe right and moderate left foraminal stenosis, similar. IMPRESSION: 1. At L3-L4, left hemilaminectomy with suspected persistent left subarticular/foraminal disc protrusion and posterior displacement of the descending left L4 nerve roots. Presumed scar tissue also in this region effaces the left foramen. Postcontrast imaging may help further differentiate scar tissue from residual/recurrent disc if clinically warranted. 2. At L5-S1, prior laminectomy with similar severe right and moderate left foraminal stenosis. 3. Milder degenerative changes at the other levels, as detailed above. Electronically Signed   By: Stevenson Elbe M.D.   On: 10/09/2023 23:45        Scheduled Meds:  amitriptyline   25 mg Oral QHS   amLODipine   10 mg Oral Daily   atorvastatin   40 mg Oral Daily   bisacodyl   10 mg Oral QHS   budesonide   0.5 mg Nebulization BID   docusate  50 mg Oral Daily   enoxaparin  (LOVENOX ) injection  0.5 mg/kg Subcutaneous Q24H   gabapentin   300 mg Oral TID   ipratropium-albuterol   3 mL Nebulization QID   levothyroxine   150 mcg Oral Q0600   lidocaine   1 patch Transdermal Q24H   multivitamin with minerals  1 tablet Oral Daily   pantoprazole   40 mg Oral Daily   torsemide   40 mg Oral Daily   traZODone   100 mg Oral QHS   venlafaxine   100 mg Oral BID   [START ON 10/14/2023] Vitamin D   (Ergocalciferol )  50,000 Units Oral Q7 days   Continuous Infusions:   LOS: 0 days     Raymonde Calico, MD [facility]   If 7PM-7AM, please contact night-coverage [email or web address] Password [facility] 10/11/2023, 10:33 AM

## 2023-10-11 NOTE — TOC Initial Note (Signed)
 Transition of Care [facility]) - Initial/Assessment Note    Patient Details  Name: Christopher Mason MRN: [id number] Date of Birth: 1960-06-25  [facility]) CM/SW Contact:    Alexandra Ice, RN Phone Number: 10/11/2023, 3:32 PM  Clinical Narrative:                  Patient having increased oxygen demand. PT and OT pending. TOC will continue to follow for discharge needs.   Barriers to Discharge: Continued Medical Work up   Patient Goals and CMS Choice            Expected Discharge Plan and Services       Living arrangements for the past 2 months: Single Family Home                                      Prior Living Arrangements/Services Living arrangements for the past 2 months: Single Family Home Lives with:: Significant Other Patient language and need for interpreter reviewed:: Yes Do you feel safe going back to the place where you live?: Yes      Need for Family Participation in Patient Care: Yes (Comment) Care giver support system in place?: Yes (comment)   Criminal Activity/Legal Involvement Pertinent to Current Situation/Hospitalization: No - Comment as needed  Activities of Daily Living   ADL Screening (condition at time of admission) Independently performs ADLs?: (P) No Does the patient have a NEW difficulty with bathing/dressing/toileting/self-feeding that is expected to last >3 days?: (P) No Does the patient have a NEW difficulty with getting in/out of bed, walking, or climbing stairs that is expected to last >3 days?: (P) Yes (Initiates electronic notice to provider for possible PT consult) Does the patient have a NEW difficulty with communication that is expected to last >3 days?: (P) No Is the patient deaf or have difficulty hearing?: (P) No Does the patient have difficulty seeing, even when wearing glasses/contacts?: (P) No Does the patient have difficulty concentrating, remembering, or making decisions?: (P) No  Permission  Sought/Granted                  Emotional Assessment Appearance:: Appears stated age     Orientation: : Oriented to Self, Oriented to Place, Oriented to  Time, Oriented to Situation Alcohol / Substance Use: Not Applicable Psych Involvement: No (comment)  Admission diagnosis:  Lumbar back pain [M54.50] Intractable back pain [M54.9] Patient Active Problem List   Diagnosis Date Noted   Intractable back pain 10/10/2023   Essential hypertension 10/10/2023   Dyslipidemia 10/10/2023   Hypothyroidism 10/10/2023   Chronic obstructive pulmonary disease (COPD) (HCC) 10/10/2023   GERD without esophagitis 10/10/2023   Acute bilateral low back pain with bilateral sciatica 09/27/2023   Stage 3 severe COPD by GOLD classification (HCC) 07/11/2023   Personal history of poliomyelitis 05/26/2023   Iron  deficiency anemia 09/20/2022   Pneumonia of left lower lobe due to infectious organism 09/13/2022   Atelectasis of both lungs 09/08/2022   Lumbar radiculopathy 09/06/2022   Obesity (BMI 30-39.9) 09/05/2022   Constipation 09/02/2022   Anasarca 08/30/2022   Chronic respiratory failure with hypoxia and hypercapnia (HCC) 08/30/2022   Lumbar radiculopathy, acute 08/17/2022   Left leg weakness 08/16/2022   History of colon polyps    Cigarette nicotine  dependence without complication 10/31/2017   Chronic pain of left knee 10/10/2017   Lower extremity edema 10/10/2017   Recurrent  left inguinal hernia 10/10/2017   Schizophrenia (HCC) 07/03/2017   HLD (hyperlipidemia) 02/07/2015   COPD, group C, by GOLD 2017 classification (HCC) 02/07/2015   AKI (acute kidney injury) (HCC) 11/20/2014   Hypokalemia 11/06/2014   Acquired hypothyroidism 09/17/2014   GERD (gastroesophageal reflux disease) 09/17/2014   Tobacco use disorder 09/17/2014   HTN (hypertension) 09/16/2014   Tardive dyskinesia 09/16/2014   PCP:  Laurence Pons, NP Pharmacy:   [facility] [address], [place] - [address] [place] [address] Phone: [phone] Fax: [phone]  [id number] - [place], [place] - [address] AT [facility] OF [address] [place] [address] Phone: [phone] Fax: [phone]  [facility] [address] [place] [address] Phone: [phone] Fax: [phone]     Social Drivers of Health (SDOH) Social History: SDOH Screenings   Food Insecurity: No Food Insecurity (07/12/2023)  Housing: Low Risk  (07/12/2023)  Transportation Needs: No Transportation Needs (07/12/2023)  Utilities: Not At Risk (07/12/2023)  Alcohol Screen: Low Risk  (09/06/2021)  Depression (PHQ2-9): Low Risk  (09/06/2021)  Social Connections: Socially Isolated (07/12/2023)  Tobacco Use: Medium Risk (10/09/2023)   SDOH Interventions:     Readmission Risk Interventions    07/11/2023    8:25 PM  Readmission Risk Prevention Plan  Transportation Screening Complete  PCP or Specialist Appt within 3-5 Days Complete  HRI or Home Care Consult Complete  Social Work Consult for Recovery Care Planning/Counseling Complete  Palliative Care Screening Not Applicable  Medication Review Oceanographer) Complete

## 2023-10-12 LAB — BASIC METABOLIC PANEL WITH GFR
Anion gap: 8 (ref 5–15)
BUN: 24 mg/dL — ABNORMAL HIGH (ref 8–23)
CO2: 34 mmol/L — ABNORMAL HIGH (ref 22–32)
Calcium: 8.6 mg/dL — ABNORMAL LOW (ref 8.9–10.3)
Chloride: 93 mmol/L — ABNORMAL LOW (ref 98–111)
Creatinine, Ser: 0.96 mg/dL (ref 0.61–1.24)
GFR, Estimated: >60 mL/min (ref >60)
Glucose, Bld: 174 mg/dL — ABNORMAL HIGH (ref 70–99)
Potassium: 3.5 mmol/L (ref 3.5–5.1)
Sodium: 135 mmol/L (ref 135–145)

## 2023-10-12 MED ORDER — GABAPENTIN 100 MG PO CAPS
200.0000 mg | ORAL_CAPSULE | Freq: Three times a day (TID) | ORAL | Status: AC
Start: 2023-10-12 — End: ?
  Administered 2023-10-12 – 2023-10-14 (×5): 200 mg via ORAL
  Filled 2023-10-12 (×5): qty 2

## 2023-10-12 MED ORDER — TRAZODONE HCL 50 MG PO TABS
50.0000 mg | ORAL_TABLET | Freq: Every day | ORAL | Status: DC
  Administered 2023-10-12 – 2023-10-13 (×2): 50 mg via ORAL
  Filled 2023-10-12 (×2): qty 1

## 2023-10-12 NOTE — Progress Notes (Signed)
 PROGRESS NOTE    Christopher Mason  WUJ:[id number] DOB: [birth year] DOA: 10/09/2023 PCP: Laurence Pons, NP     Brief Narrative:   From admission h and p  Christopher Mason is a 63 y.o. Caucasian male with medical history significant for multiple back surgeries- last was left L3-L4 microdiscectomy by Dr. Mont Antis on 09/06/22, asthma, bipolar disorder, COPD, depression, GERD, hypertension, dyslipidemia, and IBS as well as schizophrenia, tardive dyskinesia, vitamin D  deficiency, bilateral leg pain, and postpolio syndrome, who presented to the emergency room with acute onset of intractable low back pain with bilateral leg radiation.  It has been going on for a year but has gotten worse over the last 3 weeks.  He was seen in neurosurgery clinic today and was ordered MRI.  He has been taking Neurontin  for pain.  He admits to occasional paresthesias in both lower extremities with tingling and numbness.  He denied any saddle anesthesia, urinary or stool incontinence.  He admitted to mild dysuria without urinary frequency or urgency, hematuria or flank pain.  No cough or wheezing or dyspnea.  No chest pain or palpitations.  No bleeding diathesis.   Assessment & Plan:   Mild encephalopathy/ increased somnolence: Likely from hypercapnia Discontinue PO narcotics for now, reassess in am Follow up mental status--> if declining obtain repeat ABG stat Use BiPAP at bedtime Reduce trazodone  dose to 50mg  at bedtime  Acute on chronic low back pain with radiculopathy Spinal stenosis Neurosurgery has evaluated, is not a surgical candidate, advises pain control, PT/OT PT/OT attempted to evaluate today but he is somnolent likely from the narcotics Reduce Neurontin  from 300mg  TID to 200mg  TID for now  Continue Prednisone  PO Continue bowel regimen  COPD with exacerbation with increased O2 requirement from baseline CXR nothing focal. 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 Probably has underlying OHS that contributes HFNC, wean as able bipap at bedtime for now treating copd as above duonebs standing  HFpEF Vascular congestion seen on CXR, bnp not elevated.  Cont home torsemide  40 daily  Morbid obesity Noted  Insomnia: now somewhat somnolent home amitryptyline,  Reduce trazodone  dose to 50mg  at bedtime  HTN: controlled, cont amlodipine , atorvastatin   Hypothyroid: cont synthroid    DVT prophylaxis: lovenox  Code Status: full Family Communication: none at bedside. No answer when either contact telephoned today.  Level of care: Med-Surg Status is: changed status to inpatient  Consultants:  Neurosurgery (signed off)  Procedures: none  Antimicrobials:  azithromycin     Subjective: Reports feeling sleepy, breathing stable otherwise denies fever, chills, nausea, vomiting   Objective: Vitals:   10/11/23 1542 10/11/23 1941 10/12/23 0420 10/12/23 0803  BP: (!) 116/99 111/64 115/64 128/79  Pulse: 80 73 75 65  Resp: 18 19 19  (!) 22  Temp: 98.6 F (37 C) 98.3 F (36.8 C) 98.1 F (36.7 C) 98.2 F (36.8 C)  TempSrc:   Oral Oral  SpO2: 96% 98% 98% 93%  Weight:      Height:        Intake/Output Summary (Last 24 hours) at 10/12/2023 0838 Last data filed at 10/11/2023 2300 Gross per 24 hour  Intake 480 ml  Output 1100 ml  Net -620 ml   Filed Weights   10/10/23 0154 10/10/23 1256  Weight: 125.3 kg 127.8 kg    Examination:  General exam: Appears calm  and comfortable, chronically ill Respiratory system: mild wheezing b/l Cardiovascular system: S1 & S2 heard, RRR. Distant heart sounds Gastrointestinal system: Abdomen is obese, soft and nontender.   Central nervous system: Somewhat sleepy. Awakes and answers questions when we talk   Extremities: Symmetric 5 x 5 power. No  edema Skin: No rashes, lesions or ulcers Psychiatry: calm  Data Reviewed: I have personally reviewed following labs and imaging studies  CBC: Recent Labs  Lab 10/09/23 1657 10/10/23 0323  WBC 10.7* 11.5*  NEUTROABS 8.0*  --   HGB 11.7* 10.9*  HCT 37.8* 35.4*  MCV 91.5 92.4  PLT 188 193   Basic Metabolic Panel: Recent Labs  Lab 10/09/23 1657 10/10/23 0323 10/11/23 1140 10/12/23 0317  NA 137 136 141 135  K 3.2* 4.0 3.3* 3.5  CL 94* 96* 97* 93*  CO2 29 29 33* 34*  GLUCOSE 117* 173* 141* 174*  BUN 17 21 23  24*  CREATININE 0.99 1.05 0.80 0.96  CALCIUM  8.8* 8.6* 9.0 8.6*   GFR: Estimated Creatinine Clearance: 101.2 mL/min (by C-G formula based on SCr of 0.96 mg/dL). Liver Function Tests: Recent Labs  Lab 10/09/23 1657  AST 21  ALT 16  ALKPHOS 82  BILITOT 0.7  PROT 7.8  ALBUMIN  3.9   No results for input(s): LIPASE, AMYLASE in the last 168 hours. No results for input(s): AMMONIA in the last 168 hours. Coagulation Profile: No results for input(s): INR, PROTIME in the last 168 hours. Cardiac Enzymes: No results for input(s): CKTOTAL, CKMB, CKMBINDEX, TROPONINI in the last 168 hours. BNP (last 3 results) No results for input(s): PROBNP in the last 8760 hours. HbA1C: No results for input(s): HGBA1C in the last 72 hours. CBG: No results for input(s): GLUCAP in the last 168 hours. Lipid Profile: No results for input(s): CHOL, HDL, LDLCALC, TRIG, CHOLHDL, LDLDIRECT in the last 72 hours. Thyroid  Function Tests: No results for input(s): TSH, T4TOTAL, FREET4, T3FREE, THYROIDAB in the last 72 hours. Anemia Panel: No results for input(s): VITAMINB12, FOLATE, FERRITIN, TIBC, IRON , RETICCTPCT in the last 72 hours. Urine analysis:    Component Value Date/Time   COLORURINE YELLOW (A) 10/09/2023 1657   APPEARANCEUR CLEAR (A) 10/09/2023 1657   APPEARANCEUR Clear 03/02/2020 0916   LABSPEC 1.011 10/09/2023 1657    PHURINE 5.0 10/09/2023 1657   GLUCOSEU NEGATIVE 10/09/2023 1657   HGBUR NEGATIVE 10/09/2023 1657   BILIRUBINUR NEGATIVE 10/09/2023 1657   BILIRUBINUR Negative 03/02/2020 0916   KETONESUR NEGATIVE 10/09/2023 1657   PROTEINUR NEGATIVE 10/09/2023 1657   NITRITE NEGATIVE 10/09/2023 1657   LEUKOCYTESUR NEGATIVE 10/09/2023 1657   Sepsis Labs: @LABRCNTIP (procalcitonin:4,lacticidven:4)  ) Recent Results (from the past 240 hours)  Respiratory (~20 pathogens) panel by PCR     Status: None   Collection Time: 10/11/23 12:09 PM   Specimen: Nasopharyngeal Swab; Respiratory  Result Value Ref Range Status   Adenovirus NOT DETECTED NOT DETECTED Final   Coronavirus 229E NOT DETECTED NOT DETECTED Final    Comment: (NOTE) The Coronavirus on the Respiratory Panel, DOES NOT test for the novel  Coronavirus (2019 nCoV)    Coronavirus HKU1 NOT DETECTED NOT DETECTED Final   Coronavirus NL63 NOT DETECTED NOT DETECTED Final   Coronavirus OC43 NOT DETECTED NOT DETECTED Final   Metapneumovirus NOT DETECTED NOT DETECTED Final   Rhinovirus / Enterovirus NOT DETECTED NOT DETECTED Final   Influenza A NOT DETECTED NOT DETECTED Final   Influenza B NOT DETECTED NOT DETECTED Final   Parainfluenza Virus 1 NOT DETECTED NOT  DETECTED Final   Parainfluenza Virus 2 NOT DETECTED NOT DETECTED Final   Parainfluenza Virus 3 NOT DETECTED NOT DETECTED Final   Parainfluenza Virus 4 NOT DETECTED NOT DETECTED Final   Respiratory Syncytial Virus NOT DETECTED NOT DETECTED Final   Bordetella pertussis NOT DETECTED NOT DETECTED Final   Bordetella Parapertussis NOT DETECTED NOT DETECTED Final   Chlamydophila pneumoniae NOT DETECTED NOT DETECTED Final   Mycoplasma pneumoniae NOT DETECTED NOT DETECTED Final    Comment: Performed at [facility], [address], [place], [place] [address]  Resp panel by RT-PCR (RSV, Flu A&B, Covid) Anterior Nasal Swab     Status: None   Collection Time: 10/11/23 12:09 PM   Specimen: Anterior  Nasal Swab  Result Value Ref Range Status   SARS Coronavirus 2 by RT PCR NEGATIVE NEGATIVE Final    Comment: (NOTE) SARS-CoV-2 target nucleic acids are NOT DETECTED.  The SARS-CoV-2 RNA is generally detectable in upper respiratory specimens during the acute phase of infection. The lowest concentration of SARS-CoV-2 viral copies this assay can detect is 138 copies/mL. A negative result does not preclude SARS-Cov-2 infection and should not be used as the sole basis for treatment or other patient management decisions. A negative result may occur with  improper specimen collection/handling, submission of specimen other than nasopharyngeal swab, presence of viral mutation(s) within the areas targeted by this assay, and inadequate number of viral copies(<138 copies/mL). A negative result must be combined with clinical observations, patient history, and epidemiological information. The expected result is Negative.  Fact Sheet for Patients:  BloggerCourse.com  Fact Sheet for Healthcare Providers:  SeriousBroker.it  This test is no t yet approved or cleared by the [place]  FDA and  has been authorized for detection and/or diagnosis of SARS-CoV-2 by FDA under an Emergency Use Authorization (EUA). This EUA will remain  in effect (meaning this test can be used) for the duration of the COVID-19 declaration under Section 564(b)(1) of the Act, 21 U.S.C.section 360bbb-3(b)(1), unless the authorization is terminated  or revoked sooner.       Influenza A by PCR NEGATIVE NEGATIVE Final   Influenza B by PCR NEGATIVE NEGATIVE Final    Comment: (NOTE) The Xpert Xpress SARS-CoV-2/FLU/RSV plus assay is intended as an aid in the diagnosis of influenza from Nasopharyngeal swab specimens and should not be used as a sole basis for treatment. Nasal washings and aspirates are unacceptable for Xpert Xpress SARS-CoV-2/FLU/RSV testing.  Fact Sheet for  Patients: BloggerCourse.com  Fact Sheet for Healthcare Providers: SeriousBroker.it  This test is not yet approved or cleared by the [place]  FDA and has been authorized for detection and/or diagnosis of SARS-CoV-2 by FDA under an Emergency Use Authorization (EUA). This EUA will remain in effect (meaning this test can be used) for the duration of the COVID-19 declaration under Section 564(b)(1) of the Act, 21 U.S.C. section 360bbb-3(b)(1), unless the authorization is terminated or revoked.     Resp Syncytial Virus by PCR NEGATIVE NEGATIVE Final    Comment: (NOTE) Fact Sheet for Patients: BloggerCourse.com  Fact Sheet for Healthcare Providers: SeriousBroker.it  This test is not yet approved or cleared by the [place]  FDA and has been authorized for detection and/or diagnosis of SARS-CoV-2 by FDA under an Emergency Use Authorization (EUA). This EUA will remain in effect (meaning this test can be used) for the duration of the COVID-19 declaration under Section 564(b)(1) of the Act, 21 U.S.C. section 360bbb-3(b)(1), unless the authorization is terminated or revoked.  Performed at [facility], [address] [place], [place] [address]   Expectorated Sputum Assessment w Gram Stain, Rflx to Resp Cult     Status: None   Collection Time: 10/11/23  2:50 PM   Specimen: Sputum  Result Value Ref Range Status   Specimen Description   Final    SPUTUM Performed at [facility], [address] [place], [place] [address]    Special Requests   Final    NONE Performed at [facility], [address] [place], [place] [address]    Sputum evaluation   Final    Sputum specimen not acceptable for testing.  Please recollect.   RESULT CALLED TO, READ BACK BY AND VERIFIED WITHAlease Hunter, RN AT 1542 10/11/23 RAM Performed at [facility], [address], [place], [place] [address]    Report Status 10/11/2023 FINAL  Final         Radiology Studies: CT CHEST WO CONTRAST Result Date: 10/11/2023 CLINICAL DATA:  hypoxic hypercarbic respiratory failure EXAM: CT CHEST WITHOUT CONTRAST TECHNIQUE: Multidetector CT imaging of the chest was performed following the standard protocol without IV contrast. RADIATION DOSE REDUCTION: This exam was performed according to the departmental dose-optimization program which includes automated exposure control, adjustment of the mA and/or kV according to patient size and/or use of iterative reconstruction technique. COMPARISON:  05/25/2023 FINDINGS: Cardiovascular: Extensive coronary artery calcification. Global cardiac size within normal limits. No pericardial effusion. Central pulmonary arteries are of normal caliber. Moderate atherosclerotic calcification within the thoracic aorta. No aortic aneurysm. Mediastinum/Nodes: No enlarged mediastinal or axillary lymph nodes. Thyroid  gland, trachea, and esophagus demonstrate no significant findings. Lungs/Pleura: Moderate emphysema. There is persistent bibasilar consolidation with associated volume loss, left greater than right as well as peripheral consolidation within the right lower lobe with a whorled appearance of the pulmonary bronchovascular structures likely multiple areas of rounded atelectasis given their stability over time. This does, however, appear new from remote prior examination of 01/03/2022,. No pneumothorax or pleural effusion. No central obstructing lesion. Upper Abdomen: At least mild hepatic steatosis. No acute abnormality. Musculoskeletal: No acute bone abnormality. No lytic or blastic bone lesion. Osseous structures are age appropriate. IMPRESSION: 1. Moderate emphysema. 2. Bibasilar consolidation with associated volume loss, left greater than right, most suggestive of rounded atelectasis. This does appear new from remote prior examination of  01/03/2022 and follow-up imaging in 6 months would be helpful in confirming continued stability. 3. Extensive coronary artery calcification. 4. At least mild hepatic steatosis. Aortic Atherosclerosis (ICD10-I70.0) and Emphysema (ICD10-J43.9). Electronically Signed   By: Worthy Heads M.D.   On: 10/11/2023 21:15   DG Chest Port 1 View Result Date: 10/10/2023 CLINICAL DATA:  Hypoxia EXAM: PORTABLE CHEST 1 VIEW COMPARISON:  07/13/2023 FINDINGS: Single frontal view of the chest demonstrates an enlarged cardiac silhouette. Lung volumes are diminished, with increased central pulmonary vascular congestion. No airspace disease, effusion, or pneumothorax. No acute bony abnormalities. IMPRESSION: 1. Enlarged cardiac silhouette. 2. Pulmonary vascular congestion without overt edema. Electronically Signed   By: Bobbye Burrow M.D.   On: 10/10/2023 18:28        Scheduled Meds:  amitriptyline   25 mg Oral QHS   amLODipine   10 mg Oral Daily   atorvastatin   40 mg Oral Daily   azithromycin   250 mg Oral Daily   bisacodyl   10 mg Oral QHS   budesonide   0.5 mg Nebulization BID   docusate  50 mg Oral Daily   enoxaparin  (  LOVENOX ) injection  0.5 mg/kg Subcutaneous Q24H   gabapentin   300 mg Oral TID   ipratropium-albuterol   3 mL Nebulization QID   levothyroxine   150 mcg Oral Q0600   lidocaine   1 patch Transdermal Q24H   multivitamin with minerals  1 tablet Oral Daily   pantoprazole   40 mg Oral Daily   polyethylene glycol  34 g Oral Daily   predniSONE   40 mg Oral Q breakfast   torsemide   40 mg Oral Daily   traZODone   100 mg Oral QHS   venlafaxine   100 mg Oral BID   [START ON 10/14/2023] Vitamin D  (Ergocalciferol )  50,000 Units Oral Q7 days   Continuous Infusions:   LOS: 1 day     Suzan Erm, MD [facility]   If 7PM-7AM, please contact night-coverage [email or web address] Password [facility] 10/12/2023, 8:38 AM

## 2023-10-12 NOTE — TOC Progression Note (Signed)
 Transition of Care [facility]) - Progression Note    Patient Details  Name: Christopher Mason MRN: [id number] Date of Birth: [birth year]  [facility] CM/SW Contact  Alexandra Ice, RN Phone Number: 10/12/2023, 3:31 PM  Clinical Narrative:      Attempted to meet with patient to discharge home health, patient was sleeping.    Barriers to Discharge: Continued Medical Work up  Expected Discharge Plan and Services       Living arrangements for the past 2 months: Single Family Home                                       Social Determinants of Health (SDOH) Interventions SDOH Screenings   Food Insecurity: No Food Insecurity (07/12/2023)  Housing: Low Risk  (07/12/2023)  Transportation Needs: No Transportation Needs (07/12/2023)  Utilities: Not At Risk (07/12/2023)  Alcohol Screen: Low Risk  (09/06/2021)  Depression (PHQ2-9): Low Risk  (09/06/2021)  Social Connections: Socially Isolated (07/12/2023)  Tobacco Use: Medium Risk (10/09/2023)    Readmission Risk Interventions    07/11/2023    8:25 PM  Readmission Risk Prevention Plan  Transportation Screening Complete  PCP or Specialist Appt within 3-5 Days Complete  HRI or Home Care Consult Complete  Social Work Consult for Recovery Care Planning/Counseling Complete  Palliative Care Screening Not Applicable  Medication Review Oceanographer) Complete

## 2023-10-12 NOTE — Evaluation (Signed)
 Occupational Therapy Evaluation Patient Details Name: Christopher Mason MRN: [id number] DOB: 23-Feb-1961 Today's Date: 10/12/2023   History of Present Illness   Christopher Mason is a 62yoM who comes to [facility] with acute on chronic radicular leg pain and difficulty walking x3 days. PMH: COPD, schizophrenia, spinal surgery, hypoTSH, BPD, tardive dyskinesia, multilevel lumbar spondylosis s/p microdiscectomy 09/06/23. Neurosurgery is consulted, does not feel pt to be a surgical candidate at this time. Pt has elevated O2 needs here as well.     Clinical Impressions Chart reviewed, pt greeted semi supine in bed, oriented x4, reporting he feels very very sleepy with eyes closed at times throughout session (pt was pre medicated). PTA pt reports he lives alone, has a PCA 3 hrs per day 7 days a week who helps with bathing/peri care/LB dressing and IADLs. Pt presents with deficits in strength, endurance, activity tolerance, balance, cognition affecting safe and optimal ADL completion. Supervision required for use of urinal in bed (per pt request) but then is spilled all over bed- full linen change provided. Bed mobility completed with MIN A, STS with CGA, amb in room/short amb transfers with CGA with RW. Anticipate MAX A for LB dressing. Pt becomes increasingly agitated with this therapist stating you ask too many questions. Pt reports he plans to return home. Pt educated re; discharge recommendations. OT will continue to follow.      If plan is discharge home, recommend the following:   A little help with walking and/or transfers;A lot of help with bathing/dressing/bathroom     Functional Status Assessment   Patient has had a recent decline in their functional status and demonstrates the ability to make significant improvements in function in a reasonable and predictable amount of time.     Equipment Recommendations   None recommended by OT     Recommendations for Other Services          Precautions/Restrictions   Precautions Precautions: Fall Recall of Precautions/Restrictions: Intact Restrictions Weight Bearing Restrictions Per Provider Order: No     Mobility Bed Mobility Overal bed mobility: Needs Assistance Bed Mobility: Supine to Sit, Sit to Supine     Supine to sit: Min assist, HOB elevated Sit to supine: Min assist, HOB elevated        Transfers Overall transfer level: Needs assistance Equipment used: Rolling walker (2 wheels) Transfers: Sit to/from Stand Sit to Stand: From elevated surface, Supervision, Contact guard assist                  Balance Overall balance assessment: Modified Independent, Needs assistance Sitting-balance support: Feet supported Sitting balance-Leahy Scale: Good     Standing balance support: Bilateral upper extremity supported, During functional activity, Reliant on assistive device for balance Standing balance-Leahy Scale: Fair                             ADL either performed or assessed with clinical judgement   ADL Overall ADL's : Needs assistance/impaired Eating/Feeding: Set up;Sitting               Upper Body Dressing : Set up;Sitting   Lower Body Dressing: Maximal assistance;Sitting/lateral leans   Toilet Transfer: Contact guard assist;Rolling walker (2 wheels);Cueing for Chief of Staff Details (indicate cue type and reason): simulated Toileting- Clothing Manipulation and Hygiene: Maximal assistance Toileting - Clothing Manipulation Details (indicate cue type and reason): supervision for use of urinal in bed- pt spills all over bed  Functional mobility during ADLs: Rolling walker (2 wheels);Contact guard assist (approx 8' in room with RW)       Vision Patient Visual Report: No change from baseline       Perception         Praxis         Pertinent Vitals/Pain Pain Assessment Pain Assessment: Faces Faces Pain Scale: Hurts little more Pain Location:  generalized     Extremity/Trunk Assessment Upper Extremity Assessment Upper Extremity Assessment: Generalized weakness   Lower Extremity Assessment Lower Extremity Assessment: Generalized weakness       Communication Communication Communication: Impaired Factors Affecting Communication: Reduced clarity of speech (slurred/garbled at times)   Cognition Arousal: Alert Behavior During Therapy: Impulsive Cognition: No family/caregiver present to determine baseline, Cognition impaired     Awareness: Online awareness impaired Memory impairment (select all impairments): Declarative long-term memory Attention impairment (select first level of impairment): Selective attention Executive functioning impairment (select all impairments): Problem solving, Reasoning                   Following commands: Intact       Cueing  General Comments   Cueing Techniques: Verbal cues  spo2 >90% on 10L via HFNC throughout   Exercises Other Exercises Other Exercises: edu re: role of OT, role of rehab, energy conservation techniques   Shoulder Instructions      Home Living Family/patient expects to be discharged to:: Private residence Living Arrangements: Alone Available Help at Discharge: Available PRN/intermittently;Personal care attendant (3 hrs per day/7 days a week) Type of Home: Apartment Home Access: Level entry     Home Layout: One level     Bathroom Shower/Tub: Chief Strategy Officer: Standard     Home Equipment: Rollator (4 wheels);Grab bars - tub/shower;Rolling Walker (2 wheels);Wheelchair - manual          Prior Functioning/Environment Prior Level of Function : Needs assist             Mobility Comments: RW household distances ADLs Comments: aid helps with bathing, LB dressing, IADLs suc has cleaning, meal prep, grocery shopping    OT Problem List: Decreased activity tolerance;Decreased knowledge of use of DME or AE;Decreased safety  awareness;Decreased strength;Impaired balance (sitting and/or standing);Decreased cognition   OT Treatment/Interventions: Self-care/ADL training;DME and/or AE instruction;Therapeutic activities;Balance training;Therapeutic exercise;Energy conservation;Patient/family education      OT Goals(Current goals can be found in the care plan section)   Acute Rehab OT Goals Patient Stated Goal: go home to his dog OT Goal Formulation: With patient Time For Goal Achievement: 10/26/23 Potential to Achieve Goals: Fair ADL Goals Pt Will Perform Grooming: with modified independence;sitting;standing Pt Will Perform Lower Body Dressing: with supervision;sitting/lateral leans;sit to/from stand Pt Will Transfer to Toilet: with modified independence;ambulating Pt Will Perform Toileting - Clothing Manipulation and hygiene: with modified independence;sitting/lateral leans;sit to/from stand   OT Frequency:  Min 2X/week    Co-evaluation              AM-PAC OT 6 Clicks Daily Activity     Outcome Measure Help from another person eating meals?: None Help from another person taking care of personal grooming?: None Help from another person toileting, which includes using toliet, bedpan, or urinal?: A Lot Help from another person bathing (including washing, rinsing, drying)?: A Lot Help from another person to put on and taking off regular upper body clothing?: None Help from another person to put on and taking off regular lower body clothing?: A Lot  6 Click Score: 18   End of Session Equipment Utilized During Treatment: Gait belt;Rolling walker (2 wheels) Nurse Communication: Mobility status  Activity Tolerance: Patient tolerated treatment well Patient left: in bed;with call bell/phone within reach;with bed alarm set  OT Visit Diagnosis: Other abnormalities of gait and mobility (R26.89);Muscle weakness (generalized) (M62.81)                Time: 1029-1050 OT Time Calculation (min): 21  min Charges:  OT General Charges $OT Visit: 1 Visit OT Evaluation $OT Eval Moderate Complexity: 1 Mod  Gerre Kraft, OTD OTR/L  10/12/23, 12:17 PM

## 2023-10-12 NOTE — Progress Notes (Signed)
 PT Cancellation Note  Patient Details Name: Christopher Mason MRN: [id number] DOB: 1960/06/20   Cancelled Treatment:    Reason Eval/Treat Not Completed: Fatigue/lethargy limiting ability to participate;Patient's level of consciousness (Chart reviewed, treatment attempt. Pt asleep with BiPAP in place on arrival. WIll attempt treatment again at later date/time.)  4:05 PM, 10/12/23 [name], PT, DPT Physical Therapist - [facility]  [phone] (ASCOM)    Gretta Samons C 10/12/2023, 4:05 PM

## 2023-10-13 LAB — BASIC METABOLIC PANEL WITH GFR
Anion gap: 11 (ref 5–15)
BUN: 21 mg/dL (ref 8–23)
CO2: 31 mmol/L (ref 22–32)
Calcium: 8.8 mg/dL — ABNORMAL LOW (ref 8.9–10.3)
Chloride: 96 mmol/L — ABNORMAL LOW (ref 98–111)
Creatinine, Ser: 0.79 mg/dL (ref 0.61–1.24)
GFR, Estimated: >60 mL/min (ref >60)
Glucose, Bld: 97 mg/dL (ref 70–99)
Potassium: 3.3 mmol/L — ABNORMAL LOW (ref 3.5–5.1)
Sodium: 138 mmol/L (ref 135–145)

## 2023-10-13 NOTE — Progress Notes (Signed)
 Physical Therapy Treatment Patient Details Name: Christopher Mason MRN: [id number] DOB: May 22, 1960 Today's Date: 10/13/2023   History of Present Illness Christopher Mason is a 62yoM who comes to [facility] with acute on chronic radicular leg pain and difficulty walking x3 days. PMH: COPD, schizophrenia, spinal surgery, hypoTSH, BPD, tardive dyskinesia, multilevel lumbar spondylosis s/p microdiscectomy 09/06/23. Neurosurgery is consulted, does not feel pt to be a surgical candidate at this time. Pt has elevated O2 needs here as well.    PT Comments  Patient received in bed and agreeable to PT. MD had just decreased SpO2 to 4L/min via HFNC. Patient was min A for bed mobility, SBA for sit <> stand with RW, and CGA-SBA for ambulation approx 230 feet around nursing station with RW. He was eager to ambulate, and had large increase in work of breathing with SpO2 dropping to 83% by end of ambulation. This returned to 95% with seated rest as his respiratory rate and work of breathing improved. Patient is making progress towards his goals. Patient would benefit from skilled physical therapy to address impairments and functional limitations (see PT Problem List below) to work towards stated goals and return to PLOF or maximal functional independence.     If plan is discharge home, recommend the following: Assist for transportation;Assistance with cooking/housework;Help with stairs or ramp for entrance   Can travel by private vehicle        Equipment Recommendations  None recommended by PT    Recommendations for Other Services       Precautions / Restrictions Precautions Precautions: Fall Restrictions Weight Bearing Restrictions Per Provider Order: No     Mobility  Bed Mobility Overal bed mobility: Needs Assistance Bed Mobility: Supine to Sit, Sit to Supine     Supine to sit: Min assist, HOB elevated Sit to supine: HOB elevated, Modified independent (Device/Increase time)        Transfers Overall  transfer level: Needs assistance Equipment used: Rolling walker (2 wheels) Transfers: Sit to/from Stand Sit to Stand: Supervision           General transfer comment: sit <> stand at edge of bed    Ambulation/Gait Ambulation/Gait assistance: Supervision, Contact guard assist Gait Distance (Feet): 230 Feet Assistive device: Rolling walker (2 wheels)   Gait velocity: decreased     General Gait Details: pt ambulated around nursing station. He was motivated to walk but had very labored breathing by end of ambulation and SpO2 dropped breifly to 83% on 4L/min O2. It came back up to 95% after seated rest.   Stairs             Wheelchair Mobility     Tilt Bed    Modified Rankin (Stroke Patients Only)       Balance Overall balance assessment: Modified Independent, Needs assistance Sitting-balance support: Feet supported Sitting balance-Leahy Scale: Good     Standing balance support: Bilateral upper extremity supported, During functional activity, Reliant on assistive device for balance Standing balance-Leahy Scale: Fair                              Musician Communication: Impaired Factors Affecting Communication: Reduced clarity of speech (slurred/garbled at times)  Cognition Arousal: Alert     PT - Cognitive impairments: No apparent impairments                         Following commands: Intact  Cueing Cueing Techniques: Verbal cues  Exercises      General Comments General comments (skin integrity, edema, etc.): SpO2 ranged from 90-95% on 4L on HFNC except right after ambulation when it dropped breifly to 83%      Pertinent Vitals/Pain Pain Assessment Pain Assessment: No/denies pain    Home Living                          Prior Function            PT Goals (current goals can now be found in the care plan section) Acute Rehab PT Goals Patient Stated Goal: return home to his little  dog-friend companion PT Goal Formulation: With patient Time For Goal Achievement: 10/25/23 Potential to Achieve Goals: Good Progress towards PT goals: Progressing toward goals    Frequency    Min 3X/week      PT Plan      Co-evaluation              AM-PAC PT 6 Clicks Mobility   Outcome Measure  Help needed turning from your back to your side while in a flat bed without using bedrails?: A Little Help needed moving from lying on your back to sitting on the side of a flat bed without using bedrails?: A Little Help needed moving to and from a bed to a chair (including a wheelchair)?: A Little Help needed standing up from a chair using your arms (e.g., wheelchair or bedside chair)?: A Little Help needed to walk in hospital room?: A Little Help needed climbing 3-5 steps with a railing? : A Lot 6 Click Score: 17    End of Session Equipment Utilized During Treatment: Oxygen;Gait belt Activity Tolerance: Patient limited by fatigue;Patient tolerated treatment well Patient left: in bed;with call bell/phone within reach;with bed alarm set;with nursing/sitter in room Nurse Communication: Mobility status PT Visit Diagnosis: Difficulty in walking, not elsewhere classified (R26.2);Other abnormalities of gait and mobility (R26.89);Muscle weakness (generalized) (M62.81)     Time: 8480-8462 PT Time Calculation (min) (ACUTE ONLY): 18 min  Charges:    $Therapeutic Activity: 8-22 mins PT General Charges $$ ACUTE PT VISIT: 1 Visit                     Camie SAUNDERS. Juli, PT, DPT, Cert. MDT 10/13/23, 3:49 PM

## 2023-10-13 NOTE — Plan of Care (Signed)

## 2023-10-13 NOTE — Progress Notes (Signed)
 PROGRESS NOTE    Christopher Mason  FMW:[id number] DOB: 11-25-1960 DOA: 10/09/2023 PCP: Liana Fish, NP     Brief Narrative:   From admission h and p  Christopher Mason is a 63 y.o. Caucasian male with medical history significant for multiple back surgeries- last was left L3-L4 microdiscectomy by Dr. Clois on 09/06/22, asthma, bipolar disorder, COPD, depression, GERD, hypertension, dyslipidemia, and IBS as well as schizophrenia, tardive dyskinesia, vitamin D  deficiency, bilateral leg pain, and postpolio syndrome, who presented to the emergency room with acute onset of intractable low back pain with bilateral leg radiation.  It has been going on for a year but has gotten worse over the last 3 weeks.  He was seen in neurosurgery clinic today and was ordered MRI.  He has been taking Neurontin  for pain.  He admits to occasional paresthesias in both lower extremities with tingling and numbness.  He denied any saddle anesthesia, urinary or stool incontinence.  He admitted to mild dysuria without urinary frequency or urgency, hematuria or flank pain.  No cough or wheezing or dyspnea.  No chest pain or palpitations.  No bleeding diathesis.   Assessment & Plan:   Principal Problem:   Intractable back pain Active Problems:   Chronic respiratory failure with hypoxia and hypercapnia (HCC)   Hypokalemia   Essential hypertension   Dyslipidemia   Schizophrenia (HCC)   Cigarette nicotine  dependence without complication   Obesity (BMI 30-39.9)   Lumbar radiculopathy   Hypothyroidism   Chronic obstructive pulmonary disease (COPD) (HCC)   GERD without esophagitis   Back pain  # Acute on chronic low back pain with radiculopathy # Spinal stenosis Neurosurgery has evaluated, is not a surgical candidate, advises pain control, PT/OT - PT/OT consulted, advising home health - oral pain regimen, bowel regimen  # COPD with exacerbation Increased o2 requirement from baseline, says cough is more  frequent and productive from priors. CXR nothing focal. Dimer and bnp wnl. Improving - start steroids - respiratory swabs - azithromycin   # Acute on chronic hypoxic hypercarbic respiratory failure # Postpolio syndrome On 4-5 liters at home, hypercarbic initially, is on bipap at night at home. OHS may also contribute - steroids and breathing treatments, azithromycin  - sputum sent for culture but not acceptable - will see about weaning down o2 - continue bipap at bedtime  # HFpEF Vascular congestion seen on CXR, bnp not elevated. Received 80 of IV lasix  initially - continue home torsemide  40 daily  # Morbid obesity Noted  # Insomnia - home amitryptyline, trazodone   # HTN Bp appropriate - home amlodipine , atorvastatin   # Hypothyroid - home synthroid    DVT prophylaxis: lovenox  Code Status: full Family Communication: none at bedside. No answer when either contact telephoned today.  Level of care: Med-Surg Status is: Observation    Consultants:  Neurosurgery (signed off)  Procedures: none  Antimicrobials:  azithromycin     Subjective: Reports breathing much improved  Objective: Vitals:   10/13/23 0334 10/13/23 0711 10/13/23 0712 10/13/23 1233  BP: (!) 142/90  118/76   Pulse: 64  64   Resp: 20  20   Temp: 98.1 F (36.7 C)     TempSrc:      SpO2: 97% 97% 97% 94%  Weight:      Height:        Intake/Output Summary (Last 24 hours) at 10/13/2023 1520 Last data filed at 10/13/2023 1323 Gross per 24 hour  Intake --  Output 1600 ml  Net -1600 ml  Filed Weights   10/10/23 0154 10/10/23 1256  Weight: 125.3 kg 127.8 kg    Examination:  General exam: Appears calm and comfortable, chronically ill Respiratory system: clear Cardiovascular system: S1 & S2 heard, RRR. Distant heart sounds Gastrointestinal system: Abdomen is obese, soft and nontender.   Central nervous system: Alert and oriented.   Extremities: Symmetric 5 x 5 power. No edema Skin: No  rashes, lesions or ulcers Psychiatry: calm    Data Reviewed: I have personally reviewed following labs and imaging studies  CBC: Recent Labs  Lab 10/09/23 1657 10/10/23 0323  WBC 10.7* 11.5*  NEUTROABS 8.0*  --   HGB 11.7* 10.9*  HCT 37.8* 35.4*  MCV 91.5 92.4  PLT 188 193   Basic Metabolic Panel: Recent Labs  Lab 10/09/23 1657 10/10/23 0323 10/11/23 1140 10/12/23 0317 10/13/23 0456  NA 137 136 141 135 138  K 3.2* 4.0 3.3* 3.5 3.3*  CL 94* 96* 97* 93* 96*  CO2 29 29 33* 34* 31  GLUCOSE 117* 173* 141* 174* 97  BUN 17 21 23  24* 21  CREATININE 0.99 1.05 0.80 0.96 0.79  CALCIUM  8.8* 8.6* 9.0 8.6* 8.8*   GFR: Estimated Creatinine Clearance: 121.4 mL/min (by C-G formula based on SCr of 0.79 mg/dL). Liver Function Tests: Recent Labs  Lab 10/09/23 1657  AST 21  ALT 16  ALKPHOS 82  BILITOT 0.7  PROT 7.8  ALBUMIN  3.9   No results for input(s): LIPASE, AMYLASE in the last 168 hours. No results for input(s): AMMONIA in the last 168 hours. Coagulation Profile: No results for input(s): INR, PROTIME in the last 168 hours. Cardiac Enzymes: No results for input(s): CKTOTAL, CKMB, CKMBINDEX, TROPONINI in the last 168 hours. BNP (last 3 results) No results for input(s): PROBNP in the last 8760 hours. HbA1C: No results for input(s): HGBA1C in the last 72 hours. CBG: No results for input(s): GLUCAP in the last 168 hours. Lipid Profile: No results for input(s): CHOL, HDL, LDLCALC, TRIG, CHOLHDL, LDLDIRECT in the last 72 hours. Thyroid  Function Tests: No results for input(s): TSH, T4TOTAL, FREET4, T3FREE, THYROIDAB in the last 72 hours. Anemia Panel: No results for input(s): VITAMINB12, FOLATE, FERRITIN, TIBC, IRON , RETICCTPCT in the last 72 hours. Urine analysis:    Component Value Date/Time   COLORURINE YELLOW (A) 10/09/2023 1657   APPEARANCEUR CLEAR (A) 10/09/2023 1657   APPEARANCEUR Clear 03/02/2020 0916    LABSPEC 1.011 10/09/2023 1657   PHURINE 5.0 10/09/2023 1657   GLUCOSEU NEGATIVE 10/09/2023 1657   HGBUR NEGATIVE 10/09/2023 1657   BILIRUBINUR NEGATIVE 10/09/2023 1657   BILIRUBINUR Negative 03/02/2020 0916   KETONESUR NEGATIVE 10/09/2023 1657   PROTEINUR NEGATIVE 10/09/2023 1657   NITRITE NEGATIVE 10/09/2023 1657   LEUKOCYTESUR NEGATIVE 10/09/2023 1657   Sepsis Labs: @LABRCNTIP (procalcitonin:4,lacticidven:4)  ) Recent Results (from the past 240 hours)  Respiratory (~20 pathogens) panel by PCR     Status: None   Collection Time: 10/11/23 12:09 PM   Specimen: Nasopharyngeal Swab; Respiratory  Result Value Ref Range Status   Adenovirus NOT DETECTED NOT DETECTED Final   Coronavirus 229E NOT DETECTED NOT DETECTED Final    Comment: (NOTE) The Coronavirus on the Respiratory Panel, DOES NOT test for the novel  Coronavirus (2019 nCoV)    Coronavirus HKU1 NOT DETECTED NOT DETECTED Final   Coronavirus NL63 NOT DETECTED NOT DETECTED Final   Coronavirus OC43 NOT DETECTED NOT DETECTED Final   Metapneumovirus NOT DETECTED NOT DETECTED Final   Rhinovirus / Enterovirus NOT DETECTED NOT DETECTED  Final   Influenza A NOT DETECTED NOT DETECTED Final   Influenza B NOT DETECTED NOT DETECTED Final   Parainfluenza Virus 1 NOT DETECTED NOT DETECTED Final   Parainfluenza Virus 2 NOT DETECTED NOT DETECTED Final   Parainfluenza Virus 3 NOT DETECTED NOT DETECTED Final   Parainfluenza Virus 4 NOT DETECTED NOT DETECTED Final   Respiratory Syncytial Virus NOT DETECTED NOT DETECTED Final   Bordetella pertussis NOT DETECTED NOT DETECTED Final   Bordetella Parapertussis NOT DETECTED NOT DETECTED Final   Chlamydophila pneumoniae NOT DETECTED NOT DETECTED Final   Mycoplasma pneumoniae NOT DETECTED NOT DETECTED Final    Comment: Performed at [facility], [address], [place], [place] [address]  Resp panel by RT-PCR (RSV, Flu A&B, Covid) Anterior Nasal Swab     Status: None   Collection Time: 10/11/23  12:09 PM   Specimen: Anterior Nasal Swab  Result Value Ref Range Status   SARS Coronavirus 2 by RT PCR NEGATIVE NEGATIVE Final    Comment: (NOTE) SARS-CoV-2 target nucleic acids are NOT DETECTED.  The SARS-CoV-2 RNA is generally detectable in upper respiratory specimens during the acute phase of infection. The lowest concentration of SARS-CoV-2 viral copies this assay can detect is 138 copies/mL. A negative result does not preclude SARS-Cov-2 infection and should not be used as the sole basis for treatment or other patient management decisions. A negative result may occur with  improper specimen collection/handling, submission of specimen other than nasopharyngeal swab, presence of viral mutation(s) within the areas targeted by this assay, and inadequate number of viral copies(<138 copies/mL). A negative result must be combined with clinical observations, patient history, and epidemiological information. The expected result is Negative.  Fact Sheet for Patients:  BloggerCourse.com  Fact Sheet for Healthcare Providers:  SeriousBroker.it  This test is no t yet approved or cleared by the [place]  FDA and  has been authorized for detection and/or diagnosis of SARS-CoV-2 by FDA under an Emergency Use Authorization (EUA). This EUA will remain  in effect (meaning this test can be used) for the duration of the COVID-19 declaration under Section 564(b)(1) of the Act, 21 U.S.C.section 360bbb-3(b)(1), unless the authorization is terminated  or revoked sooner.       Influenza A by PCR NEGATIVE NEGATIVE Final   Influenza B by PCR NEGATIVE NEGATIVE Final    Comment: (NOTE) The Xpert Xpress SARS-CoV-2/FLU/RSV plus assay is intended as an aid in the diagnosis of influenza from Nasopharyngeal swab specimens and should not be used as a sole basis for treatment. Nasal washings and aspirates are unacceptable for Xpert Xpress  SARS-CoV-2/FLU/RSV testing.  Fact Sheet for Patients: BloggerCourse.com  Fact Sheet for Healthcare Providers: SeriousBroker.it  This test is not yet approved or cleared by the [place]  FDA and has been authorized for detection and/or diagnosis of SARS-CoV-2 by FDA under an Emergency Use Authorization (EUA). This EUA will remain in effect (meaning this test can be used) for the duration of the COVID-19 declaration under Section 564(b)(1) of the Act, 21 U.S.C. section 360bbb-3(b)(1), unless the authorization is terminated or revoked.     Resp Syncytial Virus by PCR NEGATIVE NEGATIVE Final    Comment: (NOTE) Fact Sheet for Patients: BloggerCourse.com  Fact Sheet for Healthcare Providers: SeriousBroker.it  This test is not yet approved or cleared by the [place]  FDA and has been authorized for detection and/or diagnosis of SARS-CoV-2 by FDA under an Emergency Use Authorization (EUA). This EUA will remain in effect (meaning this test  can be used) for the duration of the COVID-19 declaration under Section 564(b)(1) of the Act, 21 U.S.C. section 360bbb-3(b)(1), unless the authorization is terminated or revoked.  Performed at [facility], [address] [place], [place] [address]   Expectorated Sputum Assessment w Gram Stain, Rflx to Resp Cult     Status: None   Collection Time: 10/11/23  2:50 PM   Specimen: Sputum  Result Value Ref Range Status   Specimen Description   Final    SPUTUM Performed at [facility], [address] [place], [place] [address]    Special Requests   Final    NONE Performed at [facility], [address] [place], [place] [address]    Sputum evaluation   Final    Sputum specimen not acceptable for testing.  Please recollect.   RESULT CALLED TO, READ BACK BY AND VERIFIED WITHBETHA LLOYD, RN AT 1542 10/11/23  RAM Performed at [facility], [address], [place], [place] [address]    Report Status 10/11/2023 FINAL  Final         Radiology Studies: CT CHEST WO CONTRAST Result Date: 10/11/2023 CLINICAL DATA:  hypoxic hypercarbic respiratory failure EXAM: CT CHEST WITHOUT CONTRAST TECHNIQUE: Multidetector CT imaging of the chest was performed following the standard protocol without IV contrast. RADIATION DOSE REDUCTION: This exam was performed according to the departmental dose-optimization program which includes automated exposure control, adjustment of the mA and/or kV according to patient size and/or use of iterative reconstruction technique. COMPARISON:  05/25/2023 FINDINGS: Cardiovascular: Extensive coronary artery calcification. Global cardiac size within normal limits. No pericardial effusion. Central pulmonary arteries are of normal caliber. Moderate atherosclerotic calcification within the thoracic aorta. No aortic aneurysm. Mediastinum/Nodes: No enlarged mediastinal or axillary lymph nodes. Thyroid  gland, trachea, and esophagus demonstrate no significant findings. Lungs/Pleura: Moderate emphysema. There is persistent bibasilar consolidation with associated volume loss, left greater than right as well as peripheral consolidation within the right lower lobe with a whorled appearance of the pulmonary bronchovascular structures likely multiple areas of rounded atelectasis given their stability over time. This does, however, appear new from remote prior examination of 01/03/2022,. No pneumothorax or pleural effusion. No central obstructing lesion. Upper Abdomen: At least mild hepatic steatosis. No acute abnormality. Musculoskeletal: No acute bone abnormality. No lytic or blastic bone lesion. Osseous structures are age appropriate. IMPRESSION: 1. Moderate emphysema. 2. Bibasilar consolidation with associated volume loss, left greater than right, most suggestive of rounded atelectasis.  This does appear new from remote prior examination of 01/03/2022 and follow-up imaging in 6 months would be helpful in confirming continued stability. 3. Extensive coronary artery calcification. 4. At least mild hepatic steatosis. Aortic Atherosclerosis (ICD10-I70.0) and Emphysema (ICD10-J43.9). Electronically Signed   By: Dorethia Molt M.D.   On: 10/11/2023 21:15        Scheduled Meds:  amitriptyline   25 mg Oral QHS   amLODipine   10 mg Oral Daily   atorvastatin   40 mg Oral Daily   azithromycin   250 mg Oral Daily   bisacodyl   10 mg Oral QHS   budesonide   0.5 mg Nebulization BID   docusate  50 mg Oral Daily   enoxaparin  (LOVENOX ) injection  0.5 mg/kg Subcutaneous Q24H   gabapentin   200 mg Oral TID   ipratropium-albuterol   3 mL Nebulization QID   levothyroxine   150 mcg Oral Q0600   lidocaine   1 patch Transdermal Q24H   multivitamin with minerals  1 tablet Oral Daily   pantoprazole   40 mg Oral Daily   polyethylene glycol  34 g Oral Daily   predniSONE   40 mg Oral Q breakfast   torsemide   40 mg Oral Daily   traZODone   50 mg Oral QHS   venlafaxine   100 mg Oral BID   [START ON 10/14/2023] Vitamin D  (Ergocalciferol )  50,000 Units Oral Q7 days   Continuous Infusions:   LOS: 2 days     Devaughn KATHEE Ban, MD [facility]   If 7PM-7AM, please contact night-coverage [email or web address] Password [email or web address] 10/13/2023, 3:20 PM

## 2023-10-14 LAB — BASIC METABOLIC PANEL WITH GFR
Anion gap: 6 (ref 5–15)
BUN: 19 mg/dL (ref 8–23)
CO2: 35 mmol/L — ABNORMAL HIGH (ref 22–32)
Calcium: 9.1 mg/dL (ref 8.9–10.3)
Chloride: 96 mmol/L — ABNORMAL LOW (ref 98–111)
Creatinine, Ser: 0.78 mg/dL (ref 0.61–1.24)
GFR, Estimated: >60 mL/min (ref >60)
Glucose, Bld: 99 mg/dL (ref 70–99)
Potassium: 3.8 mmol/L (ref 3.5–5.1)
Sodium: 137 mmol/L (ref 135–145)

## 2023-10-14 MED ORDER — PREDNISONE 20 MG PO TABS: 40.0000 mg | ORAL_TABLET | Freq: Every day | ORAL | 0 refills | Status: AC

## 2023-10-14 MED ORDER — IPRATROPIUM-ALBUTEROL 0.5-2.5 (3) MG/3ML IN SOLN: 3.0000 mL | Freq: Two times a day (BID) | RESPIRATORY_TRACT | Status: DC

## 2023-10-14 MED ORDER — AZITHROMYCIN 250 MG PO TABS: 250.0000 mg | ORAL_TABLET | Freq: Every day | ORAL | 0 refills | Status: DC

## 2023-10-14 NOTE — Discharge Summary (Signed)
 Christopher Mason FMW:[id number] DOB: 03-Nov-1960 DOA: 10/09/2023  PCP: Liana Fish, NP  Admit date: 10/09/2023 Discharge date: 10/14/2023  Time spent: 35 minutes  Recommendations for Outpatient Follow-up:  Pcp f/u Neurosurgery and pulmonology f/u     Discharge Diagnoses:  Principal Problem:   Intractable back pain Active Problems:   Chronic respiratory failure with hypoxia and hypercapnia (HCC)   Hypokalemia   Essential hypertension   Dyslipidemia   Schizophrenia (HCC)   Cigarette nicotine  dependence without complication   Obesity (BMI 30-39.9)   Lumbar radiculopathy   Hypothyroidism   Chronic obstructive pulmonary disease (COPD) (HCC)   GERD without esophagitis   Back pain   Discharge Condition: improved  Diet recommendation: heart healthy  Filed Weights   10/10/23 0154 10/10/23 1256  Weight: 125.3 kg 127.8 kg    History of present illness:  From admission h and p Christopher Mason is a 63 y.o. Caucasian male with medical history significant for multiple back surgeries- last was left L3-L4 microdiscectomy by Dr. Clois on 09/06/22, asthma, bipolar disorder, COPD, depression, GERD, hypertension, dyslipidemia, and IBS as well as schizophrenia, tardive dyskinesia, vitamin D  deficiency, bilateral leg pain, and postpolio syndrome, who presented to the emergency room with acute onset of intractable low back pain with bilateral leg radiation.  It has been going on for a year but has gotten worse over the last 3 weeks.  He was seen in neurosurgery clinic today and was ordered MRI.  He has been taking Neurontin  for pain.  He admits to occasional paresthesias in both lower extremities with tingling and numbness.  He denied any saddle anesthesia, urinary or stool incontinence.  He admitted to mild dysuria without urinary frequency or urgency, hematuria or flank pain.  No cough or wheezing or dyspnea.  No chest pain or palpitations.  No bleeding diathesis.   Hospital Course:    Patient presents with low back pain with radiculopathy. Neurosurgery patient, followed outpatient and seen recently, they were consulted here, patient is not a surgical candidate, they advised pain control and pt/ot. Symptoms much improved here, PT advising home health which we ordered. Hospital course complicated by copd exacerbation leading to acute on chronic hypoxic hypercarbic respiratory failure, this was treated with home bipap at night and as needed as well as high flow nasal cannula and steroids/azithromycin . Wheezing resolved, breathing much improved, weaned to home 4 liters nasal cannula. Patient feels well and is says he feels ready to go home. Other chronic medical problems stable. Discharged home to complete a course of prednisone  and azithromycin , advise close pcp f/u as well as f/u with pulmonology and neurosurgery.   Procedures: none   Consultations: neurosurgery  Discharge Exam: Vitals:   10/14/23 0707 10/14/23 1008  BP: (!) 140/95   Pulse: (!) 57   Resp: 18   Temp: 98.4 F (36.9 C)   SpO2: 100% 90%    General: NAD Cardiovascular: RRR, distant heart sounds Respiratory: clear, no tachypnea   Discharge Instructions   Discharge Instructions     Diet - low sodium heart healthy   Complete by: As directed    Increase activity slowly   Complete by: As directed       Allergies as of 10/14/2023       Reactions   Aspirin Nausea And Vomiting, Swelling        Medication List     TAKE these medications    albuterol  108 (90 Base) MCG/ACT inhaler Commonly known as: VENTOLIN  HFA Inhale 2 puffs  into the lungs every 6 (six) hours as needed for wheezing or shortness of breath.   amitriptyline  25 MG tablet Commonly known as: ELAVIL  Take 1 tablet (25 mg total) by mouth at bedtime.   amLODipine  10 MG tablet Commonly known as: NORVASC  Take 1 tablet (10 mg total) by mouth daily.   atorvastatin  40 MG tablet Commonly known as: LIPITOR Take 1 tablet (40 mg  total) by mouth daily.   azithromycin  250 MG tablet Commonly known as: ZITHROMAX  Take 1 tablet (250 mg total) by mouth daily.   bisacodyl  5 MG EC tablet Commonly known as: DULCOLAX Take 2 tablets (10 mg total) by mouth at bedtime.   budesonide  0.5 MG/2ML nebulizer solution Commonly known as: PULMICORT  Take 2 mLs (0.5 mg total) by nebulization 2 (two) times daily.   Colace Clear 50 MG capsule Generic drug: docusate sodium  TAKE 1 CAPSULE BY MOUTH DAILY   gabapentin  300 MG capsule Commonly known as: NEURONTIN  Take 1 capsule (300 mg total) by mouth 3 (three) times daily.   hydrOXYzine  10 MG tablet Commonly known as: ATARAX  Take 1 tablet (10 mg total) by mouth 3 (three) times daily as needed for itching.   Invega  Sustenna 234 MG/1.5ML injection Generic drug: paliperidone  Inject 234 mg into the muscle as directed. Every 4 weeks   ipratropium-albuterol  0.5-2.5 (3) MG/3ML Soln Commonly known as: DUONEB Take 3 mLs by nebulization 4 (four) times daily.   levothyroxine  150 MCG tablet Commonly known as: SYNTHROID  Take 1 tablet (150 mcg total) by mouth daily before breakfast. On empty stomach, wait 30 minutes before eating or taking other medications.   multivitamin with minerals Tabs tablet Take 1 tablet by mouth daily.   pantoprazole  40 MG tablet Commonly known as: PROTONIX  Take 1 tablet (40 mg total) by mouth daily.   polyethylene glycol 17 g packet Commonly known as: MIRALAX  / GLYCOLAX  Take 17 g by mouth daily as needed for mild constipation.   predniSONE  20 MG tablet Commonly known as: DELTASONE  Take 2 tablets (40 mg total) by mouth daily with breakfast for 2 days. Start taking on: October 15, 2023   torsemide  20 MG tablet Commonly known as: DEMADEX  Take 2 tablets (40 mg total) by mouth daily.   traZODone  100 MG tablet Commonly known as: DESYREL  Take 100 mg by mouth at bedtime.   venlafaxine  100 MG tablet Commonly known as: EFFEXOR  Take 100 mg by mouth 2 (two)  times daily.   Vitamin D  (Ergocalciferol ) 1.25 MG (50000 UNIT) Caps capsule Commonly known as: DRISDOL  Take 1 capsule (50,000 Units total) by mouth every 7 (seven) days.       Allergies  Allergen Reactions   Aspirin Nausea And Vomiting and Swelling    Follow-up Information     Liana Fish, NP Follow up.   Specialty: Nurse Practitioner Contact information: [address] [place] [address] [phone]         Clois Fret, MD Follow up.   Specialty: Neurosurgery Contact information: [address] [place] [address] [phone]         Tamea Dedra CROME, MD Follow up.   Specialty: Pulmonary Disease Contact information: [address] [place] [address] [phone]                  The results of significant diagnostics from this hospitalization (including imaging, microbiology, ancillary and laboratory) are listed below for reference.    Significant Diagnostic Studies: CT CHEST WO CONTRAST Result Date: 10/11/2023  CLINICAL DATA:  hypoxic hypercarbic respiratory failure EXAM: CT CHEST WITHOUT CONTRAST TECHNIQUE: Multidetector CT imaging of the chest was performed following the standard protocol without IV contrast. RADIATION DOSE REDUCTION: This exam was performed according to the departmental dose-optimization program which includes automated exposure control, adjustment of the mA and/or kV according to patient size and/or use of iterative reconstruction technique. COMPARISON:  05/25/2023 FINDINGS: Cardiovascular: Extensive coronary artery calcification. Global cardiac size within normal limits. No pericardial effusion. Central pulmonary arteries are of normal caliber. Moderate atherosclerotic calcification within the thoracic aorta. No aortic aneurysm. Mediastinum/Nodes: No enlarged mediastinal or axillary lymph nodes. Thyroid  gland, trachea, and esophagus demonstrate no significant findings.  Lungs/Pleura: Moderate emphysema. There is persistent bibasilar consolidation with associated volume loss, left greater than right as well as peripheral consolidation within the right lower lobe with a whorled appearance of the pulmonary bronchovascular structures likely multiple areas of rounded atelectasis given their stability over time. This does, however, appear new from remote prior examination of 01/03/2022,. No pneumothorax or pleural effusion. No central obstructing lesion. Upper Abdomen: At least mild hepatic steatosis. No acute abnormality. Musculoskeletal: No acute bone abnormality. No lytic or blastic bone lesion. Osseous structures are age appropriate. IMPRESSION: 1. Moderate emphysema. 2. Bibasilar consolidation with associated volume loss, left greater than right, most suggestive of rounded atelectasis. This does appear new from remote prior examination of 01/03/2022 and follow-up imaging in 6 months would be helpful in confirming continued stability. 3. Extensive coronary artery calcification. 4. At least mild hepatic steatosis. Aortic Atherosclerosis (ICD10-I70.0) and Emphysema (ICD10-J43.9). Electronically Signed   By: Dorethia Molt M.D.   On: 10/11/2023 21:15   DG Chest Port 1 View Result Date: 10/10/2023 CLINICAL DATA:  Hypoxia EXAM: PORTABLE CHEST 1 VIEW COMPARISON:  07/13/2023 FINDINGS: Single frontal view of the chest demonstrates an enlarged cardiac silhouette. Lung volumes are diminished, with increased central pulmonary vascular congestion. No airspace disease, effusion, or pneumothorax. No acute bony abnormalities. IMPRESSION: 1. Enlarged cardiac silhouette. 2. Pulmonary vascular congestion without overt edema. Electronically Signed   By: Ozell Daring M.D.   On: 10/10/2023 18:28   MR Lumbar Spine Wo Contrast Result Date: 10/09/2023 CLINICAL DATA:  Lumbar radiculopathy, symptoms persist with > 6 wks treatment bilateral SLR with pain. EXAM: MRI LUMBAR SPINE WITHOUT CONTRAST  TECHNIQUE: Multiplanar, multisequence MR imaging of the lumbar spine was performed. No intravenous contrast was administered. COMPARISON:  MRI lumbar spine April 24, 24. FINDINGS: Segmentation:  Standard. Alignment:  No substantial sagittal subluxation. Vertebrae: No fracture, evidence of discitis, or suspicious bone lesion. Conus medullaris and cauda equina: Conus extends to the L1-L2 level. Conus and cauda equina appear normal. Paraspinal and other soft tissues: Postoperative changes in the posterior paraspinal soft tissues of the lumbar spine. Left renal cyst. Disc levels: T12-L1: No significant disc protrusion, foraminal stenosis, or canal stenosis. L1-L2: Right eccentric disc bulging endplate spurring with mild bilateral foraminal stenosis. Patent canal. L2-L3: Disc bulging and endplate spurring. Bilateral facet arthropathy. Mildly prominent epidural fat. Mild bilateral foraminal stenosis. Mild canal stenosis. L3-L4: Bilateral facet arthropathy. Disc bulging and endplate spurring. Postoperative changes of left hemilaminectomy with suspected persistent left subarticular/foraminal disc protrusion and posterior displacement of the descending left L4 nerve roots. Presumed scar tissue also in this region complicates evaluation and effaces the left foramen. Mild right foraminal stenosis. L4-L5: Mild disc bulging and facet arthropathy. No significant stenosis. L5-S1: Prior laminectomy. Disc bulging and endplate spurring with bilateral facet arthropathy. Resulting severe right and moderate left foraminal stenosis,  similar. IMPRESSION: 1. At L3-L4, left hemilaminectomy with suspected persistent left subarticular/foraminal disc protrusion and posterior displacement of the descending left L4 nerve roots. Presumed scar tissue also in this region effaces the left foramen. Postcontrast imaging may help further differentiate scar tissue from residual/recurrent disc if clinically warranted. 2. At L5-S1, prior laminectomy with  similar severe right and moderate left foraminal stenosis. 3. Milder degenerative changes at the other levels, as detailed above. Electronically Signed   By: Gilmore GORMAN Molt M.D.   On: 10/09/2023 23:45    Microbiology: Recent Results (from the past 240 hours)  Respiratory (~20 pathogens) panel by PCR     Status: None   Collection Time: 10/11/23 12:09 PM   Specimen: Nasopharyngeal Swab; Respiratory  Result Value Ref Range Status   Adenovirus NOT DETECTED NOT DETECTED Final   Coronavirus 229E NOT DETECTED NOT DETECTED Final    Comment: (NOTE) The Coronavirus on the Respiratory Panel, DOES NOT test for the novel  Coronavirus (2019 nCoV)    Coronavirus HKU1 NOT DETECTED NOT DETECTED Final   Coronavirus NL63 NOT DETECTED NOT DETECTED Final   Coronavirus OC43 NOT DETECTED NOT DETECTED Final   Metapneumovirus NOT DETECTED NOT DETECTED Final   Rhinovirus / Enterovirus NOT DETECTED NOT DETECTED Final   Influenza A NOT DETECTED NOT DETECTED Final   Influenza B NOT DETECTED NOT DETECTED Final   Parainfluenza Virus 1 NOT DETECTED NOT DETECTED Final   Parainfluenza Virus 2 NOT DETECTED NOT DETECTED Final   Parainfluenza Virus 3 NOT DETECTED NOT DETECTED Final   Parainfluenza Virus 4 NOT DETECTED NOT DETECTED Final   Respiratory Syncytial Virus NOT DETECTED NOT DETECTED Final   Bordetella pertussis NOT DETECTED NOT DETECTED Final   Bordetella Parapertussis NOT DETECTED NOT DETECTED Final   Chlamydophila pneumoniae NOT DETECTED NOT DETECTED Final   Mycoplasma pneumoniae NOT DETECTED NOT DETECTED Final    Comment: Performed at [facility], [address], [place], [place] [address]  Resp panel by RT-PCR (RSV, Flu A&B, Covid) Anterior Nasal Swab     Status: None   Collection Time: 10/11/23 12:09 PM   Specimen: Anterior Nasal Swab  Result Value Ref Range Status   SARS Coronavirus 2 by RT PCR NEGATIVE NEGATIVE Final    Comment: (NOTE) SARS-CoV-2 target nucleic acids are NOT  DETECTED.  The SARS-CoV-2 RNA is generally detectable in upper respiratory specimens during the acute phase of infection. The lowest concentration of SARS-CoV-2 viral copies this assay can detect is 138 copies/mL. A negative result does not preclude SARS-Cov-2 infection and should not be used as the sole basis for treatment or other patient management decisions. A negative result may occur with  improper specimen collection/handling, submission of specimen other than nasopharyngeal swab, presence of viral mutation(s) within the areas targeted by this assay, and inadequate number of viral copies(<138 copies/mL). A negative result must be combined with clinical observations, patient history, and epidemiological information. The expected result is Negative.  Fact Sheet for Patients:  BloggerCourse.com  Fact Sheet for Healthcare Providers:  SeriousBroker.it  This test is no t yet approved or cleared by the [place]  FDA and  has been authorized for detection and/or diagnosis of SARS-CoV-2 by FDA under an Emergency Use Authorization (EUA). This EUA will remain  in effect (meaning this test can be used) for the duration of the COVID-19 declaration under Section 564(b)(1) of the Act, 21 U.S.C.section 360bbb-3(b)(1), unless the authorization is terminated  or revoked sooner.  Influenza A by PCR NEGATIVE NEGATIVE Final   Influenza B by PCR NEGATIVE NEGATIVE Final    Comment: (NOTE) The Xpert Xpress SARS-CoV-2/FLU/RSV plus assay is intended as an aid in the diagnosis of influenza from Nasopharyngeal swab specimens and should not be used as a sole basis for treatment. Nasal washings and aspirates are unacceptable for Xpert Xpress SARS-CoV-2/FLU/RSV testing.  Fact Sheet for Patients: BloggerCourse.com  Fact Sheet for Healthcare Providers: SeriousBroker.it  This test is not yet  approved or cleared by the [place]  FDA and has been authorized for detection and/or diagnosis of SARS-CoV-2 by FDA under an Emergency Use Authorization (EUA). This EUA will remain in effect (meaning this test can be used) for the duration of the COVID-19 declaration under Section 564(b)(1) of the Act, 21 U.S.C. section 360bbb-3(b)(1), unless the authorization is terminated or revoked.     Resp Syncytial Virus by PCR NEGATIVE NEGATIVE Final    Comment: (NOTE) Fact Sheet for Patients: BloggerCourse.com  Fact Sheet for Healthcare Providers: SeriousBroker.it  This test is not yet approved or cleared by the [place]  FDA and has been authorized for detection and/or diagnosis of SARS-CoV-2 by FDA under an Emergency Use Authorization (EUA). This EUA will remain in effect (meaning this test can be used) for the duration of the COVID-19 declaration under Section 564(b)(1) of the Act, 21 U.S.C. section 360bbb-3(b)(1), unless the authorization is terminated or revoked.  Performed at [facility], [address] [place], [place] [address]   Expectorated Sputum Assessment w Gram Stain, Rflx to Resp Cult     Status: None   Collection Time: 10/11/23  2:50 PM   Specimen: Sputum  Result Value Ref Range Status   Specimen Description   Final    SPUTUM Performed at [facility], [address], [place], [place] [address]    Special Requests   Final    NONE Performed at [facility], [address] [place], [place] [address]    Sputum evaluation   Final    Sputum specimen not acceptable for testing.  Please recollect.   RESULT CALLED TO, READ BACK BY AND VERIFIED WITHBETHA LLOYD, RN AT 1542 10/11/23 RAM Performed at [facility], [address], [place], [place] [address]    Report Status 10/11/2023 FINAL  Final     Labs: Basic Metabolic Panel: Recent Labs  Lab 10/10/23 0323  10/11/23 1140 10/12/23 0317 10/13/23 0456 10/14/23 0548  NA 136 141 135 138 137  K 4.0 3.3* 3.5 3.3* 3.8  CL 96* 97* 93* 96* 96*  CO2 29 33* 34* 31 35*  GLUCOSE 173* 141* 174* 97 99  BUN 21 23 24* 21 19  CREATININE 1.05 0.80 0.96 0.79 0.78  CALCIUM  8.6* 9.0 8.6* 8.8* 9.1   Liver Function Tests: Recent Labs  Lab 10/09/23 1657  AST 21  ALT 16  ALKPHOS 82  BILITOT 0.7  PROT 7.8  ALBUMIN  3.9   No results for input(s): LIPASE, AMYLASE in the last 168 hours. No results for input(s): AMMONIA in the last 168 hours. CBC: Recent Labs  Lab 10/09/23 1657 10/10/23 0323  WBC 10.7* 11.5*  NEUTROABS 8.0*  --   HGB 11.7* 10.9*  HCT 37.8* 35.4*  MCV 91.5 92.4  PLT 188 193   Cardiac Enzymes: No results for input(s): CKTOTAL, CKMB, CKMBINDEX, TROPONINI in the last 168 hours. BNP: BNP (last 3 results) Recent Labs    05/22/23 1303 07/11/23 1236 10/10/23 1735  BNP 90.9 37.9  95.2    ProBNP (last 3 results) No results for input(s): PROBNP in the last 8760 hours.  CBG: No results for input(s): GLUCAP in the last 168 hours.     Signed:  Devaughn KATHEE Ban MD.  [facility] 10/14/2023, 10:27 AM

## 2023-10-14 NOTE — Plan of Care (Signed)
   Problem: Nutrition: Goal: Adequate nutrition will be maintained Outcome: Progressing   Problem: Coping: Goal: Level of anxiety will decrease Outcome: Progressing   Problem: Safety: Goal: Ability to remain free from injury will improve Outcome: Progressing

## 2023-10-19 ENCOUNTER — Ambulatory Visit: Admitting: Nurse Practitioner

## 2023-10-19 ENCOUNTER — Encounter: Payer: Self-pay | Admitting: Nurse Practitioner

## 2023-10-19 VITALS — BP 138/88 | HR 84 | Temp 97.8°F | Resp 16 | Ht 67.0 in | Wt 278.0 lb

## 2023-10-19 DIAGNOSIS — J9611 Chronic respiratory failure with hypoxia: Secondary | ICD-10-CM

## 2023-10-19 DIAGNOSIS — R5381 Other malaise: Secondary | ICD-10-CM

## 2023-10-19 DIAGNOSIS — M549 Dorsalgia, unspecified: Secondary | ICD-10-CM | POA: Diagnosis not present

## 2023-10-19 DIAGNOSIS — R531 Weakness: Secondary | ICD-10-CM

## 2023-10-19 DIAGNOSIS — J9612 Chronic respiratory failure with hypercapnia: Secondary | ICD-10-CM

## 2023-10-19 DIAGNOSIS — J449 Chronic obstructive pulmonary disease, unspecified: Secondary | ICD-10-CM

## 2023-10-19 DIAGNOSIS — Z9181 History of falling: Secondary | ICD-10-CM

## 2023-10-19 DIAGNOSIS — M5416 Radiculopathy, lumbar region: Secondary | ICD-10-CM | POA: Diagnosis not present

## 2023-10-19 DIAGNOSIS — M79605 Pain in left leg: Secondary | ICD-10-CM

## 2023-10-19 DIAGNOSIS — M79604 Pain in right leg: Secondary | ICD-10-CM

## 2023-10-19 DIAGNOSIS — G14 Postpolio syndrome: Secondary | ICD-10-CM

## 2023-10-19 DIAGNOSIS — Z09 Encounter for follow-up examination after completed treatment for conditions other than malignant neoplasm: Secondary | ICD-10-CM

## 2023-10-19 DIAGNOSIS — Z9981 Dependence on supplemental oxygen: Secondary | ICD-10-CM

## 2023-10-19 MED ORDER — PREDNISONE 20 MG PO TABS: 40.0000 mg | ORAL_TABLET | Freq: Every day | ORAL | 0 refills | Status: DC
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
## 2023-10-22 ENCOUNTER — Ambulatory Visit (INDEPENDENT_AMBULATORY_CARE_PROVIDER_SITE_OTHER): Admitting: Orthopedic Surgery

## 2023-10-22 ENCOUNTER — Encounter: Payer: Self-pay | Admitting: Orthopedic Surgery

## 2023-10-22 VITALS — BP 114/78 | Ht 67.0 in | Wt 277.0 lb

## 2023-10-22 DIAGNOSIS — M48061 Spinal stenosis, lumbar region without neurogenic claudication: Secondary | ICD-10-CM

## 2023-10-22 DIAGNOSIS — M5416 Radiculopathy, lumbar region: Secondary | ICD-10-CM

## 2023-10-22 DIAGNOSIS — Z9889 Other specified postprocedural states: Secondary | ICD-10-CM

## 2023-10-22 DIAGNOSIS — M47816 Spondylosis without myelopathy or radiculopathy, lumbar region: Secondary | ICD-10-CM

## 2023-10-22 NOTE — Patient Instructions (Signed)
 It was so nice to see you today. Thank you so much for coming in.    You have some wear and tear at L3-L4 and this may be causing your pain.   Dr. Clois does not recommend surgery for your back.   They set up home health PT for you on discharge from the hospital. They should be calling you.   I want you to see pain management here in [place] (Dr. Marcelino) to discuss possible lumbar injections. They should call you to schedule an appointment or you can call them at [phone].   Please do not hesitate to call if you have any questions or concerns. You can also message me in MyChart.   Glade Boys PA-C [phone]     The physicians and staff at [facility] Neurosurgery at [facility] are committed to providing excellent care. You may receive a survey asking for feedback about your experience at our office. We value you your feedback and appreciate you taking the time to to fill it out. The [facility] leadership team is also available to discuss your experience in person, feel free to contact us  [phone].

## 2023-10-30 ENCOUNTER — Telehealth: Payer: Self-pay

## 2023-10-30 NOTE — Telephone Encounter (Signed)
 Sent message to [facility] waiting for response

## 2023-10-31 ENCOUNTER — Telehealth: Payer: Self-pay

## 2023-10-31 NOTE — Telephone Encounter (Signed)
 Try to call pt voice mail full

## 2023-11-01 ENCOUNTER — Telehealth: Payer: Self-pay

## 2023-11-01 NOTE — Telephone Encounter (Signed)
 [facility] call back and denied

## 2023-11-01 NOTE — Telephone Encounter (Signed)
 Try to call several times his voicemail full that if he is already received home health due to hospital order

## 2023-11-01 NOTE — Telephone Encounter (Signed)
 Faxed  home health order to [facility] waiting for response

## 2023-11-07 NOTE — Telephone Encounter (Signed)
 We can do home health aide through medicaid and try to call pt today again no voicemail setup

## 2023-11-16 ENCOUNTER — Encounter: Payer: Self-pay | Admitting: Pulmonary Disease

## 2023-11-16 ENCOUNTER — Ambulatory Visit: Admitting: Pulmonary Disease

## 2023-11-20 NOTE — Telephone Encounter (Signed)
 Christopher Mason

## 2023-11-22 ENCOUNTER — Encounter: Payer: Self-pay | Admitting: Nurse Practitioner

## 2023-11-22 ENCOUNTER — Telehealth (INDEPENDENT_AMBULATORY_CARE_PROVIDER_SITE_OTHER): Admitting: Nurse Practitioner

## 2023-11-22 VITALS — Ht 67.0 in

## 2023-11-22 DIAGNOSIS — T2020XD Burn of second degree of head, face, and neck, unspecified site, subsequent encounter: Secondary | ICD-10-CM

## 2023-11-22 DIAGNOSIS — J9612 Chronic respiratory failure with hypercapnia: Secondary | ICD-10-CM

## 2023-11-22 DIAGNOSIS — Z9181 History of falling: Secondary | ICD-10-CM

## 2023-11-22 DIAGNOSIS — J449 Chronic obstructive pulmonary disease, unspecified: Secondary | ICD-10-CM | POA: Diagnosis not present

## 2023-11-22 DIAGNOSIS — J9611 Chronic respiratory failure with hypoxia: Secondary | ICD-10-CM

## 2023-11-22 DIAGNOSIS — M5416 Radiculopathy, lumbar region: Secondary | ICD-10-CM | POA: Diagnosis not present

## 2023-11-22 DIAGNOSIS — R5381 Other malaise: Secondary | ICD-10-CM

## 2023-11-22 DIAGNOSIS — Z09 Encounter for follow-up examination after completed treatment for conditions other than malignant neoplasm: Secondary | ICD-10-CM

## 2023-11-22 DIAGNOSIS — R531 Weakness: Secondary | ICD-10-CM

## 2023-11-22 DIAGNOSIS — Z9981 Dependence on supplemental oxygen: Secondary | ICD-10-CM
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
## 2023-11-23 ENCOUNTER — Encounter: Payer: Self-pay | Admitting: Nurse Practitioner

## 2023-11-29 ENCOUNTER — Other Ambulatory Visit: Payer: Self-pay | Admitting: Nurse Practitioner

## 2023-11-29 DIAGNOSIS — I10 Essential (primary) hypertension: Secondary | ICD-10-CM

## 2023-11-29 DIAGNOSIS — K219 Gastro-esophageal reflux disease without esophagitis: Secondary | ICD-10-CM

## 2023-11-29 NOTE — Telephone Encounter (Signed)
 Please review

## 2023-11-30 ENCOUNTER — Encounter: Payer: Self-pay | Admitting: Nurse Practitioner

## 2023-11-30 ENCOUNTER — Ambulatory Visit: Admitting: Nurse Practitioner

## 2023-11-30 VITALS — BP 137/84 | HR 95 | Temp 96.5°F | Resp 16 | Ht 67.0 in | Wt 272.4 lb

## 2023-11-30 DIAGNOSIS — M5442 Lumbago with sciatica, left side: Secondary | ICD-10-CM

## 2023-11-30 DIAGNOSIS — G8929 Other chronic pain: Secondary | ICD-10-CM | POA: Diagnosis not present

## 2023-11-30 DIAGNOSIS — M549 Dorsalgia, unspecified: Secondary | ICD-10-CM

## 2023-11-30 DIAGNOSIS — Z9889 Other specified postprocedural states: Secondary | ICD-10-CM | POA: Diagnosis not present

## 2023-11-30 MED ORDER — OXYCODONE HCL 5 MG PO TABS: 5.0000 mg | ORAL_TABLET | Freq: Two times a day (BID) | ORAL | 0 refills | Status: DC | PRN

## 2023-11-30 MED ORDER — CYCLOBENZAPRINE HCL 5 MG PO TABS: 5.0000 mg | ORAL_TABLET | Freq: Three times a day (TID) | ORAL | 3 refills | Status: DC | PRN

## 2023-11-30 NOTE — Progress Notes (Signed)
 [facility] [address] [place], [place] [address]  Internal MEDICINE  Office Visit Note  Patient Name: Christopher Mason  [id number]  [id number]  Date of Service: 11/30/2023  Chief Complaint  Patient presents with   Acute Visit    Left leg pain      HPI Christopher Mason presents for an acute sick visit for low back pain, left hip pain and left leg pain.  --onset of left leg pain was this week. Pain start at lumbar spine and left hip and radiates down to the left knee. No swelling, redness or signs of infection. Most likely related to chronic low back and prior back surgery.      Current Medication:  Outpatient Encounter Medications as of 11/30/2023  Medication Sig   cyclobenzaprine  (FLEXERIL ) 5 MG tablet Take 1 tablet (5 mg total) by mouth 3 (three) times daily as needed for muscle spasms.   [DISCONTINUED] oxyCODONE  (OXY IR/ROXICODONE ) 5 MG immediate release tablet Take 5 mg by mouth in the morning and at bedtime.   albuterol  (VENTOLIN  HFA) 108 (90 Base) MCG/ACT inhaler Inhale 2 puffs into the lungs every 6 (six) hours as needed for wheezing or shortness of breath.   amitriptyline  (ELAVIL ) 25 MG tablet Take 1 tablet (25 mg total) by mouth at bedtime.   atorvastatin  (LIPITOR) 40 MG tablet Take 1 tablet (40 mg total) by mouth daily.   azithromycin  (ZITHROMAX ) 250 MG tablet Take 1 tablet (250 mg total) by mouth daily.   bisacodyl  (DULCOLAX) 5 MG EC tablet Take 2 tablets (10 mg total) by mouth at bedtime.   budesonide  (PULMICORT ) 0.5 MG/2ML nebulizer solution Take 2 mLs (0.5 mg total) by nebulization 2 (two) times daily.   COLACE CLEAR 50 MG capsule TAKE 1 CAPSULE BY MOUTH DAILY   gabapentin  (NEURONTIN ) 300 MG capsule Take 1 capsule (300 mg total) by mouth 3 (three) times daily.   hydrOXYzine  (ATARAX ) 10 MG tablet Take 1 tablet (10 mg total) by mouth 3 (three) times daily as needed for itching.   INVEGA  SUSTENNA 234 MG/1.5ML injection Inject 234 mg into the muscle as directed.  Every 4 weeks   ipratropium-albuterol  (DUONEB) 0.5-2.5 (3) MG/3ML SOLN Take 3 mLs by nebulization 4 (four) times daily.   levothyroxine  (SYNTHROID ) 150 MCG tablet Take 1 tablet (150 mcg total) by mouth daily before breakfast. On empty stomach, wait 30 minutes before eating or taking other medications.   Multiple Vitamin (MULTIVITAMIN WITH MINERALS) TABS tablet Take 1 tablet by mouth daily.   oxyCODONE  (OXY IR/ROXICODONE ) 5 MG immediate release tablet Take 1 tablet (5 mg total) by mouth 2 (two) times daily as needed for severe pain (pain score 7-10).   polyethylene glycol (MIRALAX  / GLYCOLAX ) 17 g packet Take 17 g by mouth daily as needed for mild constipation.   predniSONE  (DELTASONE ) 20 MG tablet Take 2 tablets (40 mg total) by mouth daily with breakfast.   traZODone  (DESYREL ) 100 MG tablet Take 100 mg by mouth at bedtime.   venlafaxine  (EFFEXOR ) 100 MG tablet Take 100 mg by mouth 2 (two) times daily.   Vitamin D , Ergocalciferol , (DRISDOL ) 1.25 MG (50000 UNIT) CAPS capsule Take 1 capsule (50,000 Units total) by mouth every 7 (seven) days.   [DISCONTINUED] amLODipine  (NORVASC ) 10 MG tablet Take 1 tablet (10 mg total) by mouth daily.   [DISCONTINUED] pantoprazole  (PROTONIX ) 40 MG tablet Take 1 tablet (40 mg total) by mouth daily.   [DISCONTINUED] torsemide  (DEMADEX ) 20 MG tablet Take 2 tablets (40 mg total)  by mouth daily.   No facility-administered encounter medications on file as of 11/30/2023.      Medical History: Past Medical History:  Diagnosis Date   Acute cystitis without hematuria 10/31/2017   Acute kidney injury (HCC) 11/20/2014   Adenomatous polyp of colon 04/11/2022   ARF (acute renal failure) (HCC) 11/06/2014   Asthma    Bipolar disorder (HCC)    CAP (community acquired pneumonia) 08/16/2022   COPD (chronic obstructive pulmonary disease) (HCC)    COPD exacerbation (HCC) 07/11/2018   Depression    Erectile dysfunction due to arterial insufficiency 12/09/2018   Fall  09/13/2022   Fatigue 10/31/2017   Gastritis 02/07/2015   Genetic testing 01/12/2020   Negative genetic testing. No pathogenic variants identified on the [facility] Multi-Cancer Panel. VUS in DIS3L2 called c.67G>C and VUS in EGFR called c.1086C>T identified. The report date is 01/11/2020.     The Multi-Cancer Panel offered by [facility] includes sequencing and/or deletion duplication testing of the following 85 genes: AIP, ALK, APC, ATM, AXIN2,BAP1,  BARD1, BLM, BMPR1A, BRCA1, BRCA2, BRIP1   GERD (gastroesophageal reflux disease)    GI bleeding 09/05/2015   History of colon polyps    History of poliomyelitis    HLD (hyperlipidemia)    Hypertension    Hypokalemia 11/06/2014   Hyponatremia 02/07/2015   Hypotension 11/06/2014   Hypothyroid    Irritable bowel syndrome (IBS)    Non compliance w medication regimen 09/16/2014   Schizophrenia (HCC)      Vital Signs: BP 137/84   Pulse 95   Temp (!) 96.5 F (35.8 C)   Resp 16   Ht 5' 7 (1.702 m)   Wt 272 lb 6.4 oz (123.6 kg)   SpO2 94% Comment: 4L  BMI 42.66 kg/m    Review of Systems  Constitutional:  Positive for activity change and fatigue. Negative for chills and unexpected weight change.  HENT:  Negative for congestion, rhinorrhea, sneezing and sore throat.   Eyes:  Negative for redness.  Respiratory:  Positive for shortness of breath (intermittent). Negative for cough, chest tightness and wheezing.   Cardiovascular: Negative.  Negative for chest pain and palpitations.  Gastrointestinal: Negative.  Negative for abdominal pain, constipation, diarrhea, nausea and vomiting.  Genitourinary:  Negative for dysuria and frequency.  Musculoskeletal:  Positive for arthralgias, back pain, gait problem and myalgias. Negative for joint swelling and neck pain.  Skin:  Negative for rash.  Neurological:  Positive for weakness. Negative for tremors and numbness.  Hematological:  Negative for adenopathy. Does not bruise/bleed easily.   Psychiatric/Behavioral:  Positive for behavioral problems (Depression), confusion, decreased concentration and dysphoric mood. Negative for self-injury, sleep disturbance and suicidal ideas. The patient is nervous/anxious.     Physical Exam Vitals reviewed.  Constitutional:      General: He is not in acute distress.    Appearance: Normal appearance. He is obese. He is not ill-appearing.  HENT:     Head: Normocephalic and atraumatic.  Eyes:     Pupils: Pupils are equal, round, and reactive to light.  Cardiovascular:     Rate and Rhythm: Normal rate and regular rhythm.     Heart sounds: Normal heart sounds. No murmur heard. Pulmonary:     Effort: Pulmonary effort is normal. No respiratory distress.     Breath sounds: Normal breath sounds. No wheezing.  Skin:    General: Skin is warm and dry.     Capillary Refill: Capillary refill takes less than 2 seconds.  Neurological:  Mental Status: He is alert and oriented to person, place, and time.     Motor: Weakness present.     Gait: Gait abnormal.  Psychiatric:        Mood and Affect: Mood normal.        Behavior: Behavior normal.       Assessment/Plan: 1. Chronic left-sided low back pain with left-sided sciatica (Primary) Take oxycodone  as needed as prescribed. Take cyclobenzaprine  as needed. Call neurosurgery to follow up  - oxyCODONE  (OXY IR/ROXICODONE ) 5 MG immediate release tablet; Take 1 tablet (5 mg total) by mouth 2 (two) times daily as needed for severe pain (pain score 7-10).  Dispense: 40 tablet; Refill: 0 - cyclobenzaprine  (FLEXERIL ) 5 MG tablet; Take 1 tablet (5 mg total) by mouth 3 (three) times daily as needed for muscle spasms.  Dispense: 30 tablet; Refill: 3  2. Back pain with history of spinal surgery Take oxycodone  as needed as prescribed. Take cyclobenzaprine  as needed. Call neurosurgery to follow up  - oxyCODONE  (OXY IR/ROXICODONE ) 5 MG immediate release tablet; Take 1 tablet (5 mg total) by mouth 2 (two)  times daily as needed for severe pain (pain score 7-10).  Dispense: 40 tablet; Refill: 0 - cyclobenzaprine  (FLEXERIL ) 5 MG tablet; Take 1 tablet (5 mg total) by mouth 3 (three) times daily as needed for muscle spasms.  Dispense: 30 tablet; Refill: 3   General Counseling: Christopher Mason verbalizes understanding of the findings of todays visit and agrees with plan of treatment. I have discussed any further diagnostic evaluation that may be needed or ordered today. We also reviewed his medications today. he has been encouraged to call the office with any questions or concerns that should arise related to todays visit.    Counseling:    No orders of the defined types were placed in this encounter.   Meds ordered this encounter  Medications   oxyCODONE  (OXY IR/ROXICODONE ) 5 MG immediate release tablet    Sig: Take 1 tablet (5 mg total) by mouth 2 (two) times daily as needed for severe pain (pain score 7-10).    Dispense:  40 tablet    Refill:  0    Refill today, note increase in frequency.   cyclobenzaprine  (FLEXERIL ) 5 MG tablet    Sig: Take 1 tablet (5 mg total) by mouth 3 (three) times daily as needed for muscle spasms.    Dispense:  30 tablet    Refill:  3    Fill new script today    Return if symptoms worsen or fail to improve, for keep regular sheduled follow up .  [place] Controlled Substance Database was reviewed by me for overdose risk score (ORS)  Time spent:30 Minutes Time spent with patient included reviewing progress notes, labs, imaging studies, and discussing plan for follow up.   This patient was seen by Mardy Maxin, FNP-C in collaboration with Dr. Sigrid Bathe as a part of collaborative care agreement.  Krystal Delduca R. Maxin, MSN, FNP-C Internal Medicine

## 2023-12-18 ENCOUNTER — Other Ambulatory Visit: Payer: Self-pay

## 2023-12-18 ENCOUNTER — Inpatient Hospital Stay

## 2023-12-18 ENCOUNTER — Inpatient Hospital Stay
Admission: EM | Admit: 2023-12-18 | Discharge: 2023-12-21 | DRG: 190 | Disposition: A | Discharge status: Home Health | Attending: Internal Medicine | Admitting: Internal Medicine

## 2023-12-18 ENCOUNTER — Emergency Department

## 2023-12-18 DIAGNOSIS — I5A Non-ischemic myocardial injury (non-traumatic): Secondary | ICD-10-CM | POA: Diagnosis present

## 2023-12-18 DIAGNOSIS — Z79890 Hormone replacement therapy (postmenopausal): Secondary | ICD-10-CM

## 2023-12-18 DIAGNOSIS — Z9981 Dependence on supplemental oxygen: Secondary | ICD-10-CM

## 2023-12-18 DIAGNOSIS — Z8612 Personal history of poliomyelitis: Secondary | ICD-10-CM

## 2023-12-18 DIAGNOSIS — Z833 Family history of diabetes mellitus: Secondary | ICD-10-CM | POA: Diagnosis not present

## 2023-12-18 DIAGNOSIS — J449 Chronic obstructive pulmonary disease, unspecified: Secondary | ICD-10-CM

## 2023-12-18 DIAGNOSIS — I10 Essential (primary) hypertension: Secondary | ICD-10-CM | POA: Diagnosis present

## 2023-12-18 DIAGNOSIS — Z6841 Body Mass Index (BMI) 40.0 and over, adult: Secondary | ICD-10-CM

## 2023-12-18 DIAGNOSIS — N200 Calculus of kidney: Secondary | ICD-10-CM | POA: Diagnosis present

## 2023-12-18 DIAGNOSIS — E66813 Obesity, class 3: Secondary | ICD-10-CM | POA: Diagnosis present

## 2023-12-18 DIAGNOSIS — G4733 Obstructive sleep apnea (adult) (pediatric): Secondary | ICD-10-CM | POA: Diagnosis present

## 2023-12-18 DIAGNOSIS — R000 Tachycardia, unspecified: Secondary | ICD-10-CM | POA: Diagnosis present

## 2023-12-18 DIAGNOSIS — N179 Acute kidney failure, unspecified: Secondary | ICD-10-CM | POA: Diagnosis present

## 2023-12-18 DIAGNOSIS — Z79899 Other long term (current) drug therapy: Secondary | ICD-10-CM

## 2023-12-18 DIAGNOSIS — Z7951 Long term (current) use of inhaled steroids: Secondary | ICD-10-CM

## 2023-12-18 DIAGNOSIS — Z5329 Procedure and treatment not carried out because of patient's decision for other reasons: Secondary | ICD-10-CM | POA: Diagnosis present

## 2023-12-18 DIAGNOSIS — R079 Chest pain, unspecified: Secondary | ICD-10-CM | POA: Diagnosis not present

## 2023-12-18 DIAGNOSIS — Z1152 Encounter for screening for COVID-19: Secondary | ICD-10-CM | POA: Diagnosis not present

## 2023-12-18 DIAGNOSIS — Z8249 Family history of ischemic heart disease and other diseases of the circulatory system: Secondary | ICD-10-CM | POA: Diagnosis not present

## 2023-12-18 DIAGNOSIS — Z886 Allergy status to analgesic agent status: Secondary | ICD-10-CM | POA: Diagnosis not present

## 2023-12-18 DIAGNOSIS — Z860101 Personal history of adenomatous and serrated colon polyps: Secondary | ICD-10-CM

## 2023-12-18 DIAGNOSIS — Z87891 Personal history of nicotine dependence: Secondary | ICD-10-CM

## 2023-12-18 DIAGNOSIS — F209 Schizophrenia, unspecified: Secondary | ICD-10-CM | POA: Diagnosis present

## 2023-12-18 DIAGNOSIS — J9611 Chronic respiratory failure with hypoxia: Secondary | ICD-10-CM

## 2023-12-18 DIAGNOSIS — E785 Hyperlipidemia, unspecified: Secondary | ICD-10-CM | POA: Diagnosis present

## 2023-12-18 DIAGNOSIS — F419 Anxiety disorder, unspecified: Secondary | ICD-10-CM | POA: Diagnosis present

## 2023-12-18 DIAGNOSIS — J441 Chronic obstructive pulmonary disease with (acute) exacerbation: Principal | ICD-10-CM | POA: Diagnosis present

## 2023-12-18 DIAGNOSIS — R140 Abdominal distension (gaseous): Secondary | ICD-10-CM | POA: Diagnosis not present

## 2023-12-18 DIAGNOSIS — D509 Iron deficiency anemia, unspecified: Secondary | ICD-10-CM | POA: Diagnosis present

## 2023-12-18 DIAGNOSIS — J9621 Acute and chronic respiratory failure with hypoxia: Secondary | ICD-10-CM | POA: Diagnosis present

## 2023-12-18 DIAGNOSIS — I700 Atherosclerosis of aorta: Secondary | ICD-10-CM | POA: Diagnosis present

## 2023-12-18 DIAGNOSIS — M79604 Pain in right leg: Secondary | ICD-10-CM | POA: Diagnosis present

## 2023-12-18 DIAGNOSIS — E039 Hypothyroidism, unspecified: Secondary | ICD-10-CM | POA: Diagnosis present

## 2023-12-18 DIAGNOSIS — R710 Precipitous drop in hematocrit: Secondary | ICD-10-CM

## 2023-12-18 DIAGNOSIS — Z7952 Long term (current) use of systemic steroids: Secondary | ICD-10-CM

## 2023-12-18 DIAGNOSIS — I4510 Unspecified right bundle-branch block: Secondary | ICD-10-CM | POA: Diagnosis present

## 2023-12-18 DIAGNOSIS — Z9049 Acquired absence of other specified parts of digestive tract: Secondary | ICD-10-CM

## 2023-12-18 DIAGNOSIS — E860 Dehydration: Secondary | ICD-10-CM | POA: Diagnosis present

## 2023-12-18 DIAGNOSIS — M79605 Pain in left leg: Secondary | ICD-10-CM | POA: Diagnosis present

## 2023-12-18 LAB — RESP PANEL BY RT-PCR (RSV, FLU A&B, COVID)  RVPGX2
Influenza A by PCR: NEGATIVE
Influenza B by PCR: NEGATIVE
Resp Syncytial Virus by PCR: NEGATIVE
SARS Coronavirus 2 by RT PCR: NEGATIVE

## 2023-12-18 LAB — BLOOD GAS, VENOUS
Acid-Base Excess: 3 mmol/L — ABNORMAL HIGH (ref 0.0–2.0)
Bicarbonate: 28.5 mmol/L — ABNORMAL HIGH (ref 20.0–28.0)
O2 Content: 6 L/min
O2 Saturation: 72.4 %
Patient temperature: 37
pCO2, Ven: 46 mmHg (ref 44–60)
pH, Ven: 7.4 (ref 7.25–7.43)
pO2, Ven: 44 mmHg (ref 32–45)

## 2023-12-18 LAB — COMPREHENSIVE METABOLIC PANEL WITH GFR
ALT: 31 U/L (ref 0–44)
AST: 20 U/L (ref 15–41)
Albumin: 3.7 g/dL (ref 3.5–5.0)
Alkaline Phosphatase: 56 U/L (ref 38–126)
Anion gap: 10 (ref 5–15)
BUN: 27 mg/dL — ABNORMAL HIGH (ref 8–23)
CO2: 29 mmol/L (ref 22–32)
Calcium: 9.3 mg/dL (ref 8.9–10.3)
Chloride: 99 mmol/L (ref 98–111)
Creatinine, Ser: 1.51 mg/dL — ABNORMAL HIGH (ref 0.61–1.24)
GFR, Estimated: 51 mL/min — ABNORMAL LOW (ref >60)
Glucose, Bld: 204 mg/dL — ABNORMAL HIGH (ref 70–99)
Potassium: 3.7 mmol/L (ref 3.5–5.1)
Sodium: 138 mmol/L (ref 135–145)
Total Bilirubin: 0.7 mg/dL (ref 0.0–1.2)
Total Protein: 7.3 g/dL (ref 6.5–8.1)

## 2023-12-18 LAB — TROPONIN I (HIGH SENSITIVITY)
Troponin I (High Sensitivity): 52 ng/L — ABNORMAL HIGH (ref <18)
Troponin I (High Sensitivity): 33 ng/L — ABNORMAL HIGH (ref <18)

## 2023-12-18 LAB — CBC
HCT: 36.6 % — ABNORMAL LOW (ref 39.0–52.0)
Hemoglobin: 11.5 g/dL — ABNORMAL LOW (ref 13.0–17.0)
MCH: 29.5 pg (ref 26.0–34.0)
MCHC: 31.4 g/dL (ref 30.0–36.0)
MCV: 93.8 fL (ref 80.0–100.0)
Platelets: 177 K/uL (ref 150–400)
RBC: 3.9 MIL/uL — ABNORMAL LOW (ref 4.22–5.81)
RDW: 19.3 % — ABNORMAL HIGH (ref 11.5–15.5)
WBC: 17.3 K/uL — ABNORMAL HIGH (ref 4.0–10.5)
nRBC: 0 % (ref 0.0–0.2)

## 2023-12-18 LAB — LACTIC ACID, PLASMA
Lactic Acid, Venous: 2.3 mmol/L (ref 0.5–1.9)
Lactic Acid, Venous: 1.4 mmol/L (ref 0.5–1.9)

## 2023-12-18 LAB — MAGNESIUM: Magnesium: 2.3 mg/dL (ref 1.7–2.4)

## 2023-12-18 LAB — BRAIN NATRIURETIC PEPTIDE: B Natriuretic Peptide: 130.2 pg/mL — ABNORMAL HIGH (ref 0.0–100.0)

## 2023-12-18 LAB — CK: Total CK: 95 U/L (ref 49–397)

## 2023-12-18 MED ORDER — METHYLPREDNISOLONE SODIUM SUCC 40 MG IJ SOLR
40.0000 mg | Freq: Every day | INTRAMUSCULAR | Status: DC
  Administered 2023-12-19 – 2023-12-21 (×3): 40 mg via INTRAVENOUS
  Filled 2023-12-18 (×3): qty 1

## 2023-12-18 MED ORDER — OXYCODONE-ACETAMINOPHEN 5-325 MG PO TABS
1.0000 | ORAL_TABLET | ORAL | Status: DC | PRN
  Administered 2023-12-20 (×2): 1 via ORAL
  Filled 2023-12-18 (×2): qty 1

## 2023-12-18 MED ORDER — ENOXAPARIN SODIUM 60 MG/0.6ML IJ SOSY
60.0000 mg | PREFILLED_SYRINGE | INTRAMUSCULAR | Status: DC
  Administered 2023-12-18: 60 mg via SUBCUTANEOUS
  Filled 2023-12-18: qty 0.6

## 2023-12-18 MED ORDER — METHOCARBAMOL 500 MG PO TABS: 500.0000 mg | ORAL_TABLET | Freq: Three times a day (TID) | ORAL | Status: DC | PRN

## 2023-12-18 MED ORDER — SODIUM CHLORIDE 0.9 % IV SOLN
100.0000 mg | Freq: Once | INTRAVENOUS | Status: AC
  Administered 2023-12-18: 100 mg via INTRAVENOUS
  Filled 2023-12-18: qty 100

## 2023-12-18 MED ORDER — POLYETHYLENE GLYCOL 3350 17 G PO PACK
17.0000 g | PACK | Freq: Every day | ORAL | Status: DC | PRN
Start: 2023-12-18 — End: 2023-12-20

## 2023-12-18 MED ORDER — DOXYCYCLINE HYCLATE 100 MG PO TABS
100.0000 mg | ORAL_TABLET | Freq: Two times a day (BID) | ORAL | Status: DC
  Administered 2023-12-19 – 2023-12-21 (×5): 100 mg via ORAL
  Filled 2023-12-18 (×5): qty 1

## 2023-12-18 MED ORDER — PANTOPRAZOLE SODIUM 40 MG PO TBEC
40.0000 mg | DELAYED_RELEASE_TABLET | Freq: Every day | ORAL | Status: DC
Start: 2023-12-19 — End: 2023-12-21
  Administered 2023-12-19 – 2023-12-21 (×3): 40 mg via ORAL
  Filled 2023-12-18 (×3): qty 1

## 2023-12-18 MED ORDER — ALBUTEROL SULFATE (2.5 MG/3ML) 0.083% IN NEBU
10.0000 mg/h | INHALATION_SOLUTION | Freq: Once | RESPIRATORY_TRACT | Status: AC
  Administered 2023-12-18: 10 mg/h via RESPIRATORY_TRACT
  Filled 2023-12-18: qty 3

## 2023-12-18 MED ORDER — HYDRALAZINE HCL 20 MG/ML IJ SOLN: 5.0000 mg | INTRAMUSCULAR | Status: DC | PRN

## 2023-12-18 MED ORDER — GABAPENTIN 300 MG PO CAPS
300.0000 mg | ORAL_CAPSULE | Freq: Three times a day (TID) | ORAL | Status: DC
  Administered 2023-12-19 – 2023-12-21 (×6): 300 mg via ORAL
  Filled 2023-12-18 (×6): qty 1

## 2023-12-18 MED ORDER — AMITRIPTYLINE HCL 25 MG PO TABS
25.0000 mg | ORAL_TABLET | Freq: Every day | ORAL | Status: DC
  Administered 2023-12-19 – 2023-12-20 (×2): 25 mg via ORAL
  Filled 2023-12-18 (×2): qty 1

## 2023-12-18 MED ORDER — IPRATROPIUM-ALBUTEROL 0.5-2.5 (3) MG/3ML IN SOLN
3.0000 mL | RESPIRATORY_TRACT | Status: DC
  Administered 2023-12-18 – 2023-12-20 (×10): 3 mL via RESPIRATORY_TRACT
  Filled 2023-12-18 (×6): qty 3

## 2023-12-18 MED ORDER — AMLODIPINE BESYLATE 10 MG PO TABS
10.0000 mg | ORAL_TABLET | Freq: Every day | ORAL | Status: DC
  Administered 2023-12-19 – 2023-12-21 (×3): 10 mg via ORAL
  Filled 2023-12-18: qty 1
  Filled 2023-12-18: qty 2
  Filled 2023-12-18: qty 1

## 2023-12-18 MED ORDER — TRAZODONE HCL 100 MG PO TABS
100.0000 mg | ORAL_TABLET | Freq: Every evening | ORAL | Status: DC | PRN
  Administered 2023-12-19 – 2023-12-20 (×2): 100 mg via ORAL
  Filled 2023-12-18 (×2): qty 1

## 2023-12-18 MED ORDER — DM-GUAIFENESIN ER 30-600 MG PO TB12: 1.0000 | ORAL_TABLET | Freq: Two times a day (BID) | ORAL | Status: DC | PRN

## 2023-12-18 MED ORDER — MAGNESIUM SULFATE 2 GM/50ML IV SOLN
2.0000 g | INTRAVENOUS | Status: AC
  Administered 2023-12-18: 2 g via INTRAVENOUS
  Filled 2023-12-18: qty 50

## 2023-12-18 MED ORDER — ATORVASTATIN CALCIUM 20 MG PO TABS
40.0000 mg | ORAL_TABLET | Freq: Every day | ORAL | Status: DC
  Administered 2023-12-19 – 2023-12-21 (×3): 40 mg via ORAL
  Filled 2023-12-18: qty 4
  Filled 2023-12-18 (×2): qty 2

## 2023-12-18 MED ORDER — SODIUM CHLORIDE 0.9 % IV BOLUS
1000.0000 mL | Freq: Once | INTRAVENOUS | Status: AC
  Administered 2023-12-18: 1000 mL via INTRAVENOUS

## 2023-12-18 MED ORDER — ALBUTEROL SULFATE (2.5 MG/3ML) 0.083% IN NEBU: 2.5000 mg | INHALATION_SOLUTION | RESPIRATORY_TRACT | Status: DC | PRN

## 2023-12-18 MED ORDER — ONDANSETRON HCL 4 MG/2ML IJ SOLN: 4.0000 mg | Freq: Three times a day (TID) | INTRAMUSCULAR | Status: DC | PRN

## 2023-12-18 MED ORDER — ACETAMINOPHEN 325 MG PO TABS
650.0000 mg | ORAL_TABLET | Freq: Four times a day (QID) | ORAL | Status: DC | PRN
  Administered 2023-12-20: 650 mg via ORAL
  Filled 2023-12-18: qty 2

## 2023-12-18 MED ORDER — LEVOTHYROXINE SODIUM 50 MCG PO TABS
150.0000 ug | ORAL_TABLET | Freq: Every day | ORAL | Status: DC
  Administered 2023-12-19 – 2023-12-20 (×2): 150 ug via ORAL
  Filled 2023-12-18 (×3): qty 1

## 2023-12-18 MED ORDER — IPRATROPIUM-ALBUTEROL 0.5-2.5 (3) MG/3ML IN SOLN
3.0000 mL | Freq: Once | RESPIRATORY_TRACT | Status: AC
  Administered 2023-12-18: 3 mL via RESPIRATORY_TRACT
  Filled 2023-12-18: qty 3

## 2023-12-18 NOTE — ED Triage Notes (Signed)
 Pt to ED [facility] from home. Reports bilateral leg pain and shob. Placed on NRB with ems, SpO2 80s on 4 L [place]. 2 duoneb, 125 solumedrol PTA 4L [place] at baseline. RR labored, unable to speak in complete sentences

## 2023-12-18 NOTE — ED Notes (Signed)
Pt given something to drink at this time.
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
## 2023-12-18 NOTE — Progress Notes (Signed)
 Anticoagulation monitoring(Lovenox ):  63yo  M ordered Lovenox  40 mg Q24h    Filed Weights   12/18/23 1452  Weight: 127.4 kg (280 lb 13.9 oz)   BMI 43.9   Lab Results  Component Value Date   CREATININE 1.51 (H) 12/18/2023   CREATININE 0.78 10/14/2023   CREATININE 0.79 10/13/2023   Estimated Creatinine Clearance: 63.3 mL/min (A) (by C-G formula based on SCr of 1.51 mg/dL (H)). Hemoglobin & Hematocrit     Component Value Date/Time   HGB 11.5 (L) 12/18/2023 1820   HGB 13.8 03/19/2023 1431   HCT 36.6 (L) 12/18/2023 1820   HCT 42.7 03/19/2023 1431     Per Protocol for Patient with estCrcl > 30 ml/min and BMI > 30, will transition to Lovenox  0.5 mg/kg Q24h       Allean Haas PharmD Clinical Pharmacist 12/18/2023

## 2023-12-18 NOTE — ED Notes (Signed)
 Line in RFA infiltrated, elbow is swollen. Doxycycline  was infusing, pharmacy called and informed. Pillows placed under arm, ice pack attached to elbow. MD notified of infilitration

## 2023-12-18 NOTE — ED Notes (Signed)
 IV attempt was tried again, was unsuccessful. IV team consult placed to complete IV ABX
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
## 2023-12-18 NOTE — ED Notes (Addendum)
 RT at bedside, pt placed on bipap

## 2023-12-19 ENCOUNTER — Encounter: Payer: Self-pay | Admitting: Internal Medicine

## 2023-12-19 DIAGNOSIS — E66813 Obesity, class 3: Secondary | ICD-10-CM | POA: Insufficient documentation

## 2023-12-19 DIAGNOSIS — R710 Precipitous drop in hematocrit: Secondary | ICD-10-CM | POA: Diagnosis not present

## 2023-12-19 DIAGNOSIS — R140 Abdominal distension (gaseous): Secondary | ICD-10-CM

## 2023-12-19 DIAGNOSIS — G4733 Obstructive sleep apnea (adult) (pediatric): Secondary | ICD-10-CM

## 2023-12-19 DIAGNOSIS — J441 Chronic obstructive pulmonary disease with (acute) exacerbation: Secondary | ICD-10-CM | POA: Diagnosis not present

## 2023-12-19 DIAGNOSIS — M79604 Pain in right leg: Secondary | ICD-10-CM

## 2023-12-19 DIAGNOSIS — J9621 Acute and chronic respiratory failure with hypoxia: Secondary | ICD-10-CM | POA: Diagnosis not present

## 2023-12-19 DIAGNOSIS — F209 Schizophrenia, unspecified: Secondary | ICD-10-CM

## 2023-12-19 DIAGNOSIS — I5A Non-ischemic myocardial injury (non-traumatic): Secondary | ICD-10-CM | POA: Diagnosis not present

## 2023-12-19 DIAGNOSIS — M79605 Pain in left leg: Secondary | ICD-10-CM

## 2023-12-19 LAB — CBC
HCT: 26.1 % — ABNORMAL LOW (ref 39.0–52.0)
Hemoglobin: 7.9 g/dL — ABNORMAL LOW (ref 13.0–17.0)
MCH: 29.2 pg (ref 26.0–34.0)
MCHC: 30.3 g/dL (ref 30.0–36.0)
MCV: 96.3 fL (ref 80.0–100.0)
Platelets: 195 K/uL (ref 150–400)
RBC: 2.71 MIL/uL — ABNORMAL LOW (ref 4.22–5.81)
RDW: 18.1 % — ABNORMAL HIGH (ref 11.5–15.5)
WBC: 14.1 K/uL — ABNORMAL HIGH (ref 4.0–10.5)
nRBC: 0 % (ref 0.0–0.2)

## 2023-12-19 LAB — BASIC METABOLIC PANEL WITH GFR
Anion gap: 8 (ref 5–15)
BUN: 21 mg/dL (ref 8–23)
CO2: 24 mmol/L (ref 22–32)
Calcium: 8.7 mg/dL — ABNORMAL LOW (ref 8.9–10.3)
Chloride: 107 mmol/L (ref 98–111)
Creatinine, Ser: 0.81 mg/dL (ref 0.61–1.24)
GFR, Estimated: >60 mL/min (ref >60)
Glucose, Bld: 217 mg/dL — ABNORMAL HIGH (ref 70–99)
Potassium: 3.9 mmol/L (ref 3.5–5.1)
Sodium: 139 mmol/L (ref 135–145)

## 2023-12-19 LAB — ABO/RH: ABO/RH(D): O POS

## 2023-12-19 LAB — HEMOGLOBIN: Hemoglobin: 7.5 g/dL — ABNORMAL LOW (ref 13.0–17.0)

## 2023-12-19 LAB — FERRITIN: Ferritin: 47 ng/mL (ref 24–336)

## 2023-12-19 LAB — PREPARE RBC (CROSSMATCH)

## 2023-12-19 MED ORDER — HYDROXYZINE HCL 10 MG PO TABS: 10.0000 mg | ORAL_TABLET | Freq: Three times a day (TID) | ORAL | Status: DC | PRN

## 2023-12-19 MED ORDER — ACETAMINOPHEN 325 MG PO TABS
650.0000 mg | ORAL_TABLET | Freq: Once | ORAL | Status: AC
  Administered 2023-12-19: 650 mg via ORAL
  Filled 2023-12-19: qty 2

## 2023-12-19 MED ORDER — SODIUM CHLORIDE 0.9% IV SOLUTION
Freq: Once | INTRAVENOUS | Status: AC
  Filled 2023-12-19: qty 250

## 2023-12-19 NOTE — Progress Notes (Signed)
 Blood administration handed off with ED RN [facility] and this RN Will Ryder on admission to 2A unit.

## 2023-12-19 NOTE — Assessment & Plan Note (Signed)
 Patient has underlying iron  deficiency anemia.  With hemoglobin dropping 4 g and denying black or bright red blood per rectum not quite sure whether drop in hemoglobin is coming from.  Continue to monitor stools.  Benefits and risks of blood transfusion explained and patient agreeable to blood transfusion today.  Case discussed with gastroenterology.  Will need to get respiratory status better before any procedure.

## 2023-12-19 NOTE — Assessment & Plan Note (Signed)
 Resolved.  Creatinine 1.51 on presentation and down to 0.81.  Today's creatinine 1.2.  With patient having some chest pain I did restart his torsemide .

## 2023-12-19 NOTE — Assessment & Plan Note (Signed)
 -  Continue Lipitor

## 2023-12-19 NOTE — Hospital Course (Signed)
 64 y.o. male with medical history significant of HTN, HLD, COPD on 4L O2, former smoker, hypothyroidism, gastritis, GERD, GI bleeding, depression with anxiety, bipolar,, schizophrenia, morbid obesity, tardive dyskinesia, IBS, anemia, OSA on BiPAP, who presents with cough, SOB, abdominal distention.   Patient states that he has SOB and cough with little mucus production for several days, which has been progressively worsening.  Patient was found to have acute respiratory distress, could not speak in full sentence, oxygen desaturated to 80s on home level 4L oxygen, initially started on nonrebreather, then BiPAP, which has weaned off after improvement.  Patient was given 125 mg of Solu-Medrol  by EMS and nebulizer treatment in ED.  Patient does not have chest pain, fever, chills.  He has abdominal distention which is new for patient, but no nausea, vomiting, diarrhea or abdominal pain.  No symptoms UTI.  Patient complains of bilateral whole leg pain, denies any injury or fall.   Data reviewed independently and ED Course: pt was found to have WBC 17.3, AKI with creatinine 1.51, BUN 27 and GFR 51 (recent baseline creatinine 0.78 on 10/14/2023), troponin 52 --> 33, lactic acid 2.3 --> 1.4, negative PCR for COVID, flu and RSV, BNP 130.  Temperature 99.5, blood pressure 130/73, heart rate 104, RR 32.  Chest x-ray negative.  Patient is admitted to PCU as inpatient.   CT scan of abdomen/pelvis: 1. Nonobstructive 4 mm right nephrolithiasis. 2. Interval increase in size of a 1.9 cm (from 1.4 cm) fluid density lesion of the left kidney likely represents a simple renal cyst. Simple renal cysts, in the absence of clinically indicated signs/symptoms, require no independent follow-up. 3.  Aortic Atherosclerosis (ICD10-I70.0).  8/27.  Hemoglobin dropped down to 7.9 this morning and repeat hemoglobin 7.5.  Patient agreeable to 1 unit of blood.  Admitted with COPD exacerbation. 8/28.  Hemoglobin up to 11.1.  Doubt GI  bleed with overall response of blood and no bowel movement yet.  Patient was feeling better with regards to his breathing but then had chest pain.  Troponins negative 8/29.  Patient states that he needs to get out of here today.  Refused labs.  States he is feeling better.

## 2023-12-19 NOTE — ED Notes (Signed)
 Assumed care of patient, pt resting in bed with eyes shut, respirations even and unlabored, bipap in use, no distress noted

## 2023-12-19 NOTE — Assessment & Plan Note (Signed)
 As needed pain medication

## 2023-12-19 NOTE — Assessment & Plan Note (Signed)
 BMI 43.99

## 2023-12-19 NOTE — Assessment & Plan Note (Signed)
 Continue trazodone  hydroxyzine  and amitriptyline .  On Invega  monthly injection as outpatient.

## 2023-12-19 NOTE — Assessment & Plan Note (Signed)
 Continue Synthroid 

## 2023-12-19 NOTE — Plan of Care (Signed)
   Problem: Education: Goal: Knowledge of General Education information will improve Description Including pain rating scale, medication(s)/side effects and non-pharmacologic comfort measures Outcome: Progressing   Problem: Health Behavior/Discharge Planning: Goal: Ability to manage health-related needs will improve Outcome: Progressing

## 2023-12-19 NOTE — Assessment & Plan Note (Signed)
 Gave Solu-Medrol  here and will switch over to prednisone  taper upon discharge.  Patient states he has inhalers and nebulizers at home.  Continue doxycycline .

## 2023-12-19 NOTE — ED Notes (Signed)
 Patient eating lunch.

## 2023-12-19 NOTE — Progress Notes (Signed)
 Progress Note   Patient: Christopher Mason FMW:[id number] DOB: 11/20/1960 DOA: 12/18/2023     1 DOS: the patient was seen and examined on 12/19/2023   Brief hospital course:  63 y.o. male with medical history significant of HTN, HLD, COPD on 4L O2, former smoker, hypothyroidism, gastritis, GERD, GI bleeding, depression with anxiety, bipolar,, schizophrenia, morbid obesity, tardive dyskinesia, IBS, anemia, OSA on BiPAP, who presents with cough, SOB, abdominal distention.   Patient states that he has SOB and cough with little mucus production for several days, which has been progressively worsening.  Patient was found to have acute respiratory distress, could not speak in full sentence, oxygen desaturated to 80s on home level 4L oxygen, initially started on nonrebreather, then BiPAP, which has weaned off after improvement.  Patient was given 125 mg of Solu-Medrol  by EMS and nebulizer treatment in ED.  Patient does not have chest pain, fever, chills.  He has abdominal distention which is new for patient, but no nausea, vomiting, diarrhea or abdominal pain.  No symptoms UTI.  Patient complains of bilateral whole leg pain, denies any injury or fall.   Data reviewed independently and ED Course: pt was found to have WBC 17.3, AKI with creatinine 1.51, BUN 27 and GFR 51 (recent baseline creatinine 0.78 on 10/14/2023), troponin 52 --> 33, lactic acid 2.3 --> 1.4, negative PCR for COVID, flu and RSV, BNP 130.  Temperature 99.5, blood pressure 130/73, heart rate 104, RR 32.  Chest x-ray negative.  Patient is admitted to PCU as inpatient.   CT scan of abdomen/pelvis: 1. Nonobstructive 4 mm right nephrolithiasis. 2. Interval increase in size of a 1.9 cm (from 1.4 cm) fluid density lesion of the left kidney likely represents a simple renal cyst. Simple renal cysts, in the absence of clinically indicated signs/symptoms, require no independent follow-up. 3.  Aortic Atherosclerosis (ICD10-I70.0).  8/27.   Hemoglobin dropped down to 7.9 this morning and repeat hemoglobin 7.5.  Patient agreeable to 1 unit of blood.  Admitted with COPD exacerbation.  Assessment and Plan: * COPD exacerbation (HCC) Continue Solu-Medrol  and doxycycline  and nebulizer treatments.  Drop in hemoglobin Patient has underlying iron  deficiency anemia.  With hemoglobin dropping 4 g and denying black or bright red blood per rectum not quite sure whether drop in hemoglobin is coming from.  Continue to monitor stools.  Benefits and risks of blood transfusion explained and patient agreeable to blood transfusion today.  Case discussed with gastroenterology.  Will need to get respiratory status better before any procedure.  Myocardial injury Troponin ongoing minimally elevated.  Acute on chronic respiratory failure with hypoxia (HCC) Patient chronically on 4 L of oxygen at night this morning on 4 L of oxygen.  HTN (hypertension) Continue amlodipine   AKI (acute kidney injury) (HCC) Resolved.  Creatinine 1.51 on presentation and down to 0.81.  Hold torsemide .  Patient received a liter bolus.  HLD (hyperlipidemia) Continue Lipitor  Acquired hypothyroidism Continue Synthroid   Bilateral leg pain As needed pain medication.  Abdominal distention CT scan unrevealing.  Patient states his abdomen is this size all the time..  Schizophrenia (HCC) Continue trazodone  hydroxyzine  and amitriptyline .  On Invega  monthly injection as outpatient.  Obesity, Class III, BMI 40-49.9 (morbid obesity) BMI 43.99        Subjective: Patient coming in with shortness of breath.  Patient has hemoglobin on admission 11.5 and last hemoglobin down to 7.5.  Patient agreeable to blood transfusion.  Patient denies any bleeding.  Physical Exam: Vitals:  12/19/23 0800 12/19/23 0926 12/19/23 1141 12/19/23 1329  BP: 118/76  107/72   Pulse: 69  71   Resp: (!) 23  (!) 22   Temp:    98.5 F (36.9 C)  TempSrc:    Axillary  SpO2: 93% 93% 91%    Weight:       Physical Exam HENT:     Head: Normocephalic.  Eyes:     General: Lids are normal.     Conjunctiva/sclera: Conjunctivae normal.  Cardiovascular:     Rate and Rhythm: Normal rate and regular rhythm.     Heart sounds: Normal heart sounds, S1 normal and S2 normal.  Pulmonary:     Breath sounds: Examination of the right-lower field reveals decreased breath sounds and wheezing. Examination of the left-lower field reveals decreased breath sounds and wheezing. Decreased breath sounds and wheezing present. No rhonchi or rales.  Abdominal:     Palpations: Abdomen is soft.     Tenderness: There is no abdominal tenderness.  Musculoskeletal:     Right lower leg: No swelling.     Left lower leg: No swelling.  Skin:    General: Skin is warm.     Findings: No rash.  Neurological:     Mental Status: He is alert and oriented to person, place, and time.     Data Reviewed: CT scan of the abdomen reviewed, chest x-ray negative creatinine 0.81, electrolytes normal range, last hemoglobin 7.5  Family Communication: Updated brother on the phone  Disposition: Status is: Inpatient Remains inpatient appropriate because: With drop in hemoglobin we will give 1 unit of packed red blood cells.  Benefits and risks explained to patient.  Planned Discharge Destination: Home    Time spent: 28 minutes  Author: Charlie Patterson, MD 12/19/2023 1:55 PM  For on call review [email or web address].

## 2023-12-19 NOTE — Assessment & Plan Note (Signed)
 Continue amlodipine

## 2023-12-19 NOTE — Assessment & Plan Note (Signed)
 CT scan unrevealing.  Patient states his abdomen is this size all the time.SABRA

## 2023-12-19 NOTE — Assessment & Plan Note (Signed)
 Troponin only minimally elevated on admission and last 2 troponins negative

## 2023-12-19 NOTE — Assessment & Plan Note (Signed)
 Patient chronically on 4 L of oxygen.

## 2023-12-19 NOTE — Evaluation (Signed)
 Physical Therapy Evaluation Patient Details Name: Christopher Mason MRN: [id number] DOB: [birth year] Today's Date: 12/19/2023  History of Present Illness  63 y.o. male with medical history significant of HTN, HLD, COPD on 4L O2, former smoker, hypothyroidism, gastritis, GERD, GI bleeding, depression with anxiety, bipolar,, schizophrenia, morbid obesity, tardive dyskinesia, IBS, anemia, OSA on BiPAP, who presents with cough, SOB, abdominal distention.   Clinical Impression  Patient received in bed, he reports he came in because his leg wasn't working, not because of breathing. Reports R LE pain and it won't do anything. Patient on 4 liters O2 at home. Seems to have anxiety during session, crying at times. He is able to get to edge of bed and stood at bedside with RW. Patient was able to take a few side steps at edge of bed with walker and cga. He will continue to benefit from skilled PT to improve independence and safety for return home.           If plan is discharge home, recommend the following: A little help with walking and/or transfers;A little help with bathing/dressing/bathroom;Assist for transportation;Direct supervision/assist for financial management;Supervision due to cognitive status;Direct supervision/assist for medications management   Can travel by private vehicle    yes    Equipment Recommendations None recommended by PT  Recommendations for Other Services       Functional Status Assessment Patient has had a recent decline in their functional status and demonstrates the ability to make significant improvements in function in a reasonable and predictable amount of time.     Precautions / Restrictions Precautions Precautions: Fall Recall of Precautions/Restrictions: Impaired Restrictions Weight Bearing Restrictions Per Provider Order: No      Mobility  Bed Mobility Overal bed mobility: Needs Assistance Bed Mobility: Supine to Sit, Sit to Supine     Supine to sit:  Min assist, Contact guard Sit to supine: Modified independent (Device/Increase time)   General bed mobility comments: use of bed rails and min A/cga with R LE to exit bed    Transfers Overall transfer level: Needs assistance Equipment used: Rolling walker (2 wheels) Transfers: Sit to/from Stand Sit to Stand: Contact guard assist                Ambulation/Gait Ambulation/Gait assistance: Contact guard assist Gait Distance (Feet): 3 Feet Assistive device: Rolling walker (2 wheels) Gait Pattern/deviations: Step-to pattern Gait velocity: decr     General Gait Details: patient was able to laterally step at edge of bed, but reports R LE pain in doing so.  Stairs            Wheelchair Mobility     Tilt Bed    Modified Rankin (Stroke Patients Only)       Balance Overall balance assessment: Needs assistance Sitting-balance support: Feet supported Sitting balance-Leahy Scale: Good     Standing balance support: Bilateral upper extremity supported, During functional activity, Reliant on assistive device for balance Standing balance-Leahy Scale: Fair                               Pertinent Vitals/Pain Pain Assessment Pain Assessment: Faces Faces Pain Scale: Hurts little more Pain Location: R Leg Pain Descriptors / Indicators: Discomfort, Crying Pain Intervention(s): Monitored during session, Repositioned    Home Living Family/patient expects to be discharged to:: Private residence Living Arrangements: Alone Available Help at Discharge: Personal care attendant Type of Home: Apartment Home Access: Level entry  Home Layout: One level Home Equipment: Rollator (4 wheels);Grab bars - tub/shower;Rolling Walker (2 wheels);Wheelchair - manual Additional Comments: patient has PCA that comes 3 hours a day    Prior Function Prior Level of Function : Independent/Modified Independent             Mobility Comments: RW household distances ADLs  Comments: aid helps with bathing, LB dressing, IADLs suc has cleaning, meal prep, grocery shopping     Extremity/Trunk Assessment   Upper Extremity Assessment Upper Extremity Assessment: Generalized weakness    Lower Extremity Assessment Lower Extremity Assessment: Generalized weakness    Cervical / Trunk Assessment Cervical / Trunk Assessment: Normal  Communication   Communication Communication: Impaired Factors Affecting Communication: Reduced clarity of speech    Cognition Arousal: Alert Behavior During Therapy: Anxious   PT - Cognitive impairments: Problem solving, Safety/Judgement                         Following commands: Intact       Cueing Cueing Techniques: Verbal cues     General Comments      Exercises     Assessment/Plan    PT Assessment Patient needs continued PT services  PT Problem List Decreased activity tolerance;Decreased balance;Decreased mobility;Pain       PT Treatment Interventions DME instruction;Gait training;Functional mobility training;Therapeutic activities;Therapeutic exercise;Patient/family education    PT Goals (Current goals can be found in the Care Plan section)  Acute Rehab PT Goals Patient Stated Goal: return home to dog PT Goal Formulation: With patient Time For Goal Achievement: 01/02/24 Potential to Achieve Goals: Fair    Frequency Min 2X/week     Co-evaluation               AM-PAC PT 6 Clicks Mobility  Outcome Measure Help needed turning from your back to your side while in a flat bed without using bedrails?: A Little Help needed moving from lying on your back to sitting on the side of a flat bed without using bedrails?: A Little Help needed moving to and from a bed to a chair (including a wheelchair)?: A Little Help needed standing up from a chair using your arms (e.g., wheelchair or bedside chair)?: A Little Help needed to walk in hospital room?: A Lot Help needed climbing 3-5 steps with a  railing? : A Lot 6 Click Score: 16    End of Session Equipment Utilized During Treatment: Oxygen Activity Tolerance: Patient limited by fatigue;Patient limited by pain Patient left: in bed;with call bell/phone within reach;with bed alarm set Nurse Communication: Mobility status PT Visit Diagnosis: Other abnormalities of gait and mobility (R26.89);Unsteadiness on feet (R26.81);Muscle weakness (generalized) (M62.81);Pain;Difficulty in walking, not elsewhere classified (R26.2) Pain - Right/Left: Right Pain - part of body: Leg    Time: 8594-8574 PT Time Calculation (min) (ACUTE ONLY): 20 min   Charges:   PT Evaluation $PT Eval Moderate Complexity: 1 Mod PT Treatments $Therapeutic Activity: 8-22 mins PT General Charges $$ ACUTE PT VISIT: 1 Visit         Zerenity Bowron, PT, GCS 12/19/23,3:34 PM

## 2023-12-20 DIAGNOSIS — R079 Chest pain, unspecified: Secondary | ICD-10-CM

## 2023-12-20 DIAGNOSIS — J441 Chronic obstructive pulmonary disease with (acute) exacerbation: Secondary | ICD-10-CM | POA: Diagnosis not present

## 2023-12-20 DIAGNOSIS — I5A Non-ischemic myocardial injury (non-traumatic): Secondary | ICD-10-CM | POA: Diagnosis not present

## 2023-12-20 DIAGNOSIS — J9621 Acute and chronic respiratory failure with hypoxia: Secondary | ICD-10-CM | POA: Diagnosis not present

## 2023-12-20 DIAGNOSIS — R710 Precipitous drop in hematocrit: Secondary | ICD-10-CM | POA: Diagnosis not present

## 2023-12-20 LAB — TYPE AND SCREEN
ABO/RH(D): O POS
Antibody Screen: NEGATIVE
Unit division: 0

## 2023-12-20 LAB — BASIC METABOLIC PANEL WITH GFR
Anion gap: 10 (ref 5–15)
BUN: 29 mg/dL — ABNORMAL HIGH (ref 8–23)
CO2: 27 mmol/L (ref 22–32)
Calcium: 9.5 mg/dL (ref 8.9–10.3)
Chloride: 101 mmol/L (ref 98–111)
Creatinine, Ser: 1.2 mg/dL (ref 0.61–1.24)
GFR, Estimated: >60 mL/min (ref >60)
Glucose, Bld: 253 mg/dL — ABNORMAL HIGH (ref 70–99)
Potassium: 3.7 mmol/L (ref 3.5–5.1)
Sodium: 138 mmol/L (ref 135–145)

## 2023-12-20 LAB — CBC
HCT: 36.4 % — ABNORMAL LOW (ref 39.0–52.0)
Hemoglobin: 11.1 g/dL — ABNORMAL LOW (ref 13.0–17.0)
MCH: 29.2 pg (ref 26.0–34.0)
MCHC: 30.5 g/dL (ref 30.0–36.0)
MCV: 95.8 fL (ref 80.0–100.0)
Platelets: 175 K/uL (ref 150–400)
RBC: 3.8 MIL/uL — ABNORMAL LOW (ref 4.22–5.81)
RDW: 17.5 % — ABNORMAL HIGH (ref 11.5–15.5)
WBC: 14.1 K/uL — ABNORMAL HIGH (ref 4.0–10.5)
nRBC: 0.4 % — ABNORMAL HIGH (ref 0.0–0.2)

## 2023-12-20 LAB — BPAM RBC
Blood Product Expiration Date: 202509282359
ISSUE DATE / TIME: 202508271435
Unit Type and Rh: 5100

## 2023-12-20 LAB — TROPONIN I (HIGH SENSITIVITY)
Troponin I (High Sensitivity): 16 ng/L (ref <18)
Troponin I (High Sensitivity): 17 ng/L (ref <18)

## 2023-12-20 MED ORDER — NITROGLYCERIN 0.4 MG SL SUBL
0.4000 mg | SUBLINGUAL_TABLET | SUBLINGUAL | Status: DC | PRN
  Administered 2023-12-20 (×2): 0.4 mg via SUBLINGUAL
  Filled 2023-12-20 (×2): qty 1

## 2023-12-20 MED ORDER — TORSEMIDE 20 MG PO TABS
40.0000 mg | ORAL_TABLET | Freq: Every day | ORAL | Status: DC
  Administered 2023-12-20 – 2023-12-21 (×2): 40 mg via ORAL
  Filled 2023-12-20 (×2): qty 2

## 2023-12-20 MED ORDER — POLYETHYLENE GLYCOL 3350 17 G PO PACK
17.0000 g | PACK | Freq: Every day | ORAL | Status: DC
  Administered 2023-12-21: 17 g via ORAL
  Filled 2023-12-20 (×2): qty 1

## 2023-12-20 MED ORDER — IPRATROPIUM-ALBUTEROL 0.5-2.5 (3) MG/3ML IN SOLN
3.0000 mL | Freq: Four times a day (QID) | RESPIRATORY_TRACT | Status: DC
  Administered 2023-12-20 – 2023-12-21 (×4): 3 mL via RESPIRATORY_TRACT
  Filled 2023-12-20 (×4): qty 3

## 2023-12-20 NOTE — Progress Notes (Addendum)
 Progress Note   Patient: Christopher Mason FMW:[id number] DOB: 08-13-1960 DOA: 12/18/2023     2 DOS: the patient was seen and examined on 12/20/2023   Brief hospital course:  63 y.o. male with medical history significant of HTN, HLD, COPD on 4L O2, former smoker, hypothyroidism, gastritis, GERD, GI bleeding, depression with anxiety, bipolar,, schizophrenia, morbid obesity, tardive dyskinesia, IBS, anemia, OSA on BiPAP, who presents with cough, SOB, abdominal distention.   Patient states that he has SOB and cough with little mucus production for several days, which has been progressively worsening.  Patient was found to have acute respiratory distress, could not speak in full sentence, oxygen desaturated to 80s on home level 4L oxygen, initially started on nonrebreather, then BiPAP, which has weaned off after improvement.  Patient was given 125 mg of Solu-Medrol  by EMS and nebulizer treatment in ED.  Patient does not have chest pain, fever, chills.  He has abdominal distention which is new for patient, but no nausea, vomiting, diarrhea or abdominal pain.  No symptoms UTI.  Patient complains of bilateral whole leg pain, denies any injury or fall.   Data reviewed independently and ED Course: pt was found to have WBC 17.3, AKI with creatinine 1.51, BUN 27 and GFR 51 (recent baseline creatinine 0.78 on 10/14/2023), troponin 52 --> 33, lactic acid 2.3 --> 1.4, negative PCR for COVID, flu and RSV, BNP 130.  Temperature 99.5, blood pressure 130/73, heart rate 104, RR 32.  Chest x-ray negative.  Patient is admitted to PCU as inpatient.   CT scan of abdomen/pelvis: 1. Nonobstructive 4 mm right nephrolithiasis. 2. Interval increase in size of a 1.9 cm (from 1.4 cm) fluid density lesion of the left kidney likely represents a simple renal cyst. Simple renal cysts, in the absence of clinically indicated signs/symptoms, require no independent follow-up. 3.  Aortic Atherosclerosis (ICD10-I70.0).  8/27.   Hemoglobin dropped down to 7.9 this morning and repeat hemoglobin 7.5.  Patient agreeable to 1 unit of blood.  Admitted with COPD exacerbation. 8/28.  Hemoglobin up to 11.1.  Doubt GI bleed with overall response of blood and no bowel movement yet.  Patient was feeling better with regards to his breathing but then had chest pain.  First troponin negative  Assessment and Plan: * COPD exacerbation (HCC) Continue Solu-Medrol  and doxycycline  and nebulizer treatments.  Drop in hemoglobin Patient has underlying iron  deficiency anemia.  Patient given 1 unit of packed red blood cells and hemoglobin up to 11.1.  No signs of bleeding currently.  I am wondering if the 2 lower hemoglobins are false readings.  Myocardial injury Troponin only minimally elevated and last troponin negative at 16.  Acute on chronic respiratory failure with hypoxia (HCC) Patient chronically on 4 L of oxygen at night this morning on 4 L of oxygen.  HTN (hypertension) Continue amlodipine   AKI (acute kidney injury) (HCC) Resolved.  Creatinine 1.51 on presentation and down to 0.81.  Today's creatinine 1.2.  With patient having some chest pain I did restart his torsemide .  HLD (hyperlipidemia) Continue Lipitor  Acquired hypothyroidism Continue Synthroid   Bilateral leg pain As needed pain medication.  Abdominal distention CT scan unrevealing.  Patient states his abdomen is this size all the time..  Schizophrenia (HCC) Continue trazodone  hydroxyzine  and amitriptyline .  On Invega  monthly injection as outpatient.  Chest pain Could be anxiety related.  Troponin 16.  Relieved with nitroglycerin .  Obesity, Class III, BMI 40-49.9 (morbid obesity) BMI 43.99  Subjective: Patient this morning asked me to go home and was feeling better with regards to his breathing but later developed chest pain.  It was relieved by 2 nitroglycerin .  Admitted with COPD exacerbation.  Given a unit of blood for drop in  hemoglobin.  Physical Exam: Vitals:   12/20/23 0409 12/20/23 0744 12/20/23 1226 12/20/23 1453  BP:  (!) 113/93 133/82   Pulse:  75 87   Resp:   20   Temp:  97.8 F (36.6 C) 97.6 F (36.4 C)   TempSrc:      SpO2: 95% 95% 90% 90%  Weight:      Height:       Physical Exam HENT:     Head: Normocephalic.  Eyes:     General: Lids are normal.     Conjunctiva/sclera: Conjunctivae normal.  Cardiovascular:     Rate and Rhythm: Normal rate and regular rhythm.     Heart sounds: Normal heart sounds, S1 normal and S2 normal.  Pulmonary:     Breath sounds: Examination of the right-lower field reveals decreased breath sounds. Examination of the left-lower field reveals decreased breath sounds. Decreased breath sounds present. No wheezing, rhonchi or rales.  Abdominal:     Palpations: Abdomen is soft.     Tenderness: There is no abdominal tenderness.  Musculoskeletal:     Right lower leg: No swelling.     Left lower leg: No swelling.  Skin:    General: Skin is warm.     Findings: No rash.  Neurological:     Mental Status: He is alert and oriented to person, place, and time.     Data Reviewed: White blood cell count 14.1, hemoglobin 11.1, platelet count 175, troponin 16, creatinine 1.2 EKG reviewed by me shows normal sinus rhythm 82 bpm right bundle branch block nonspecific ST-T wave changes  Family Communication: Spoke with brother on the phone  Disposition: Status is: Inpatient Remains inpatient appropriate because: With chest pain today I will hold off on any disposition.  Reevaluate tomorrow.  Planned Discharge Destination: Home    Time spent: 28 minutes  Author: Charlie Patterson, MD 12/20/2023 3:24 PM  For on call review [email or web address].

## 2023-12-20 NOTE — Progress Notes (Addendum)
 First dose of nitroglycerin  sublingual administered.

## 2023-12-20 NOTE — Progress Notes (Signed)
 Patient complaining of chest pain. 12-lead EKG order placed. Charlie Patterson, MD notified via epic chat.

## 2023-12-20 NOTE — Progress Notes (Signed)
 PT Cancellation Note  Patient Details Name: Christopher Mason MRN: [id number] DOB: 06-08-1960   Cancelled Treatment:    Reason Eval/Treat Not Completed: Medical issues which prohibited therapy Patient reporting chest pain. Await medical clearance.   Kailon Treese 12/20/2023, 11:26 AM

## 2023-12-20 NOTE — Progress Notes (Addendum)
 Patient stating no longer having chest pain after receiving second dose of nitroglycerin .

## 2023-12-20 NOTE — Progress Notes (Signed)
 Second dose of nitroglycerin  sublingual administered.

## 2023-12-20 NOTE — Progress Notes (Signed)
 Physical Therapy Treatment Patient Details Name: Christopher Mason MRN: [id number] DOB: 1960-06-24 Today's Date: 12/20/2023   History of Present Illness 63 y.o. male with medical history significant of HTN, HLD, COPD on 4L O2, former smoker, hypothyroidism, gastritis, GERD, GI bleeding, depression with anxiety, bipolar,, schizophrenia, morbid obesity, tardive dyskinesia, IBS, anemia, OSA on BiPAP, who presents with cough, SOB, abdominal distention.    PT Comments  Patient received in bed he is pleasant and agrees to PT/OT session. He requires min A for bed mobility and is able to stand with min A. He requires cues for safety/hand placement during transfers. Patient is able to ambulate ~ 125 feet with single seated rest break due to fatigue. Chair followed for safety. He will continue to benefit from skilled PT to improve strength and independence.      If plan is discharge home, recommend the following: A little help with walking and/or transfers;A little help with bathing/dressing/bathroom;Assist for transportation;Direct supervision/assist for financial management;Supervision due to cognitive status;Direct supervision/assist for medications management   Can travel by private vehicle      yes  Equipment Recommendations  None recommended by PT    Recommendations for Other Services       Precautions / Restrictions Precautions Precautions: Fall Recall of Precautions/Restrictions: Impaired Restrictions Weight Bearing Restrictions Per Provider Order: No     Mobility  Bed Mobility Overal bed mobility: Needs Assistance Bed Mobility: Supine to Sit     Supine to sit: Min assist, Contact guard     General bed mobility comments: use of bed rails and min A/cga with R LE to exit bed    Transfers Overall transfer level: Needs assistance Equipment used: Rolling walker (2 wheels) Transfers: Sit to/from Stand Sit to Stand: Contact guard assist, +2 safety/equipment                 Ambulation/Gait Ambulation/Gait assistance: Contact guard assist, +2 safety/equipment Gait Distance (Feet): 125 Feet Assistive device: Rolling walker (2 wheels) Gait Pattern/deviations: Step-through pattern, Decreased step length - right, Decreased step length - left, Decreased stride length Gait velocity: decr     General Gait Details: patient ambulated with RW and chair follow. 1 seated rest break. O2 sats at 90%. Patient breathing heavily with activity.   Stairs             Wheelchair Mobility     Tilt Bed    Modified Rankin (Stroke Patients Only)       Balance Overall balance assessment: Modified Independent Sitting-balance support: Feet supported Sitting balance-Leahy Scale: Good     Standing balance support: Bilateral upper extremity supported, During functional activity, Reliant on assistive device for balance Standing balance-Leahy Scale: Fair                              Hotel manager: Impaired Factors Affecting Communication: Reduced clarity of speech  Cognition Arousal: Alert Behavior During Therapy: Anxious   PT - Cognitive impairments: Problem solving, Safety/Judgement                         Following commands: Intact      Cueing Cueing Techniques: Visual cues  Exercises      General Comments        Pertinent Vitals/Pain Pain Assessment Pain Assessment: No/denies pain    Home Living  Prior Function            PT Goals (current goals can now be found in the care plan section) Acute Rehab PT Goals Patient Stated Goal: return home to dog PT Goal Formulation: With patient Time For Goal Achievement: 01/02/24 Potential to Achieve Goals: Fair Progress towards PT goals: Progressing toward goals    Frequency    Min 2X/week      PT Plan      Co-evaluation PT/OT/SLP Co-Evaluation/Treatment: Yes Reason for Co-Treatment: Necessary to  address cognition/behavior during functional activity;For patient/therapist safety;To address functional/ADL transfers PT goals addressed during session: Mobility/safety with mobility;Balance;Proper use of DME        AM-PAC PT 6 Clicks Mobility   Outcome Measure  Help needed turning from your back to your side while in a flat bed without using bedrails?: A Little Help needed moving from lying on your back to sitting on the side of a flat bed without using bedrails?: A Little Help needed moving to and from a bed to a chair (including a wheelchair)?: A Little Help needed standing up from a chair using your arms (e.g., wheelchair or bedside chair)?: A Little Help needed to walk in hospital room?: A Lot Help needed climbing 3-5 steps with a railing? : A Lot 6 Click Score: 16    End of Session Equipment Utilized During Treatment: Oxygen Activity Tolerance: Patient tolerated treatment well Patient left: in bed;with call bell/phone within reach;Other (comment) (OT present in room to help with bath) Nurse Communication: Mobility status PT Visit Diagnosis: Other abnormalities of gait and mobility (R26.89);Unsteadiness on feet (R26.81);Muscle weakness (generalized) (M62.81);Pain;Difficulty in walking, not elsewhere classified (R26.2) Pain - Right/Left: Right Pain - part of body: Leg     Time: 1400-1420 PT Time Calculation (min) (ACUTE ONLY): 20 min  Charges:    $Gait Training: 8-22 mins PT General Charges $$ ACUTE PT VISIT: 1 Visit                     Eline Geng, PT, GCS 12/20/23,3:03 PM

## 2023-12-20 NOTE — Progress Notes (Signed)
   12/20/23 1056  Provider Notification  Provider Name/Title Charlie Patterson, MD  Date Provider Notified 12/20/23  Time Provider Notified 1056  Method of Notification Page (epic chat)  Notification Reason Other (Comment) (pt complaining of chest pain)  Provider response Other (Comment);See new orders (placed STAT 12-lead EKG order, nitro ordered by MD)  Date of Provider Response 12/20/23  Time of Provider Response 1059   **Will administer PRN nitroglycerin .

## 2023-12-20 NOTE — Assessment & Plan Note (Addendum)
 Could be anxiety related.  Troponin x 2 negative.

## 2023-12-20 NOTE — TOC Initial Note (Addendum)
 Transition of Care [facility]) - Initial/Assessment Note    Patient Details  Name: Christopher Mason MRN: [id number] Date of Birth: [birth year]  Transition of Care [facility]) CM/SW Contact:    Dalia GORMAN Fuse, RN Phone Number: 12/20/2023, 4:26 PM  Clinical Narrative:                 TOC met with the patient in the room. He is requesting to have his diet changed from Heart Healthy to Regular so that he can order what he wants. The nurse was able to get the order changed. He is from home with his pet Jersey. He is on home O2, but doesn't have anyone that can bring in his O2 for discharge. He uses [employer] for transportation. He sees Dr. Liana.  Therapy recommends HH PT/OT. TOC reached out to [facility] to setup HH PT?OT. Larraine is verifying whether Arlina is INN with the patients payor.  TOC outreached to Mitch with [employer] to see if they could provide a loaner for the patient to transport home. Thomasina is queuing discharge tanks and will have them delivered to the room tomorrow.   Expected Discharge Plan: Home w Home Health Services Barriers to Discharge: Continued Medical Work up   Patient Goals and CMS Choice     Choice offered to / list presented to : Patient      Expected Discharge Plan and Services                           DME Agency: [facility] Date DME Agency Contacted: 12/20/23 Time DME Agency Contacted: 1625 Representative spoke with at DME Agency: Larraine HH Arranged: PT          Prior Living Arrangements/Services   Lives with:: Self              Current home services: DME (O2 concentrator, RW, [facility], Hospital Bed)    Activities of Daily Living   ADL Screening (condition at time of admission) Independently performs ADLs?: No Does the patient have a NEW difficulty with bathing/dressing/toileting/self-feeding that is expected to last >3 days?: Yes (Initiates electronic notice to provider for possible OT consult) Does the patient have a NEW difficulty with  getting in/out of bed, walking, or climbing stairs that is expected to last >3 days?: Yes (Initiates electronic notice to provider for possible PT consult) Does the patient have a NEW difficulty with communication that is expected to last >3 days?: No Is the patient deaf or have difficulty hearing?: No Does the patient have difficulty seeing, even when wearing glasses/contacts?: No Does the patient have difficulty concentrating, remembering, or making decisions?: No  Permission Sought/Granted                  Emotional Assessment              Admission diagnosis:  COPD exacerbation (HCC) [J44.1] Patient Active Problem List   Diagnosis Date Noted   Chest pain 12/20/2023   Drop in hemoglobin 12/19/2023   Obesity, Class III, BMI 40-49.9 (morbid obesity) 12/19/2023   COPD exacerbation (HCC) 12/18/2023   Acute on chronic respiratory failure with hypoxia (HCC) 12/18/2023   Myocardial injury 12/18/2023   OSA treated with BiPAP 12/18/2023   Bilateral leg pain 12/18/2023   Abdominal distention 12/18/2023   Back pain with history of spinal surgery 11/30/2023   Back pain 10/11/2023   Intractable back pain 10/10/2023   Essential hypertension 10/10/2023   Dyslipidemia  10/10/2023   Hypothyroidism 10/10/2023   Chronic obstructive pulmonary disease (COPD) (HCC) 10/10/2023   GERD without esophagitis 10/10/2023   Acute bilateral low back pain with bilateral sciatica 09/27/2023   Stage 3 severe COPD by GOLD classification (HCC) 07/11/2023   Personal history of poliomyelitis 05/26/2023   Iron  deficiency anemia 09/20/2022   Pneumonia of left lower lobe due to infectious organism 09/13/2022   Atelectasis of both lungs 09/08/2022   Lumbar radiculopathy 09/06/2022   Obesity (BMI 30-39.9) 09/05/2022   Constipation 09/02/2022   Anasarca 08/30/2022   Chronic respiratory failure with hypoxia and hypercapnia (HCC) 08/30/2022   Lumbar radiculopathy, acute 08/17/2022   Left leg weakness  08/16/2022   History of colon polyps    Cigarette nicotine  dependence without complication 10/31/2017   Chronic pain of left knee 10/10/2017   Lower extremity edema 10/10/2017   Recurrent left inguinal hernia 10/10/2017   Schizophrenia (HCC) 07/03/2017   HLD (hyperlipidemia) 02/07/2015   COPD, group C, by GOLD 2017 classification (HCC) 02/07/2015   AKI (acute kidney injury) (HCC) 11/20/2014   Hypokalemia 11/06/2014   Acquired hypothyroidism 09/17/2014   GERD (gastroesophageal reflux disease) 09/17/2014   Tobacco use disorder 09/17/2014   HTN (hypertension) 09/16/2014   Tardive dyskinesia 09/16/2014   PCP:  Liana Fish, NP Pharmacy:   [facility] [place], [place] - [address], [address], [address] [place] [address] Phone: [phone] Fax: [phone]  [id number] [place], [place] - [address] AT [facility] [address] [place] [address] [place] [address] Phone: [phone] Fax: [phone]  [facility] - [facility] [address] [place] [address] Phone: [phone] Fax: [phone]     Social Drivers of Health (SDOH) Social History: SDOH Screenings   Food Insecurity: Food Insecurity Present (12/19/2023)  Housing: Low Risk  (12/19/2023)  Transportation Needs: No Transportation Needs (12/19/2023)  Utilities: At Risk (12/19/2023)  Alcohol Screen: Low Risk  (09/06/2021)  Depression (PHQ2-9): Low Risk  (09/06/2021)  Financial Resource Strain: Low Risk  (10/30/2023)   [facility] from [facility]  Social Connections: Socially Isolated (12/19/2023)  Tobacco Use: Medium Risk (12/19/2023)   SDOH Interventions:     Readmission Risk Interventions    07/11/2023    8:25 PM  Readmission Risk Prevention Plan  Transportation Screening Complete  PCP or Specialist Appt within 3-5 Days Complete  HRI or Home Care Consult Complete  Social Work Consult for Recovery  Care Planning/Counseling Complete  Palliative Care Screening Not Applicable  Medication Review Oceanographer) Complete

## 2023-12-20 NOTE — Evaluation (Signed)
 Occupational Therapy Evaluation Patient Details Name: Christopher Mason MRN: [id number] DOB: 05/21/1960 Today's Date: 12/20/2023   History of Present Illness   63 y.o. male with medical history significant of HTN, HLD, COPD on 4L O2, former smoker, hypothyroidism, gastritis, GERD, GI bleeding, depression with anxiety, bipolar,, schizophrenia, morbid obesity, tardive dyskinesia, IBS, anemia, OSA on BiPAP, who presents with cough, SOB, abdominal distention.     Clinical Impressions Pt admitted with above diagnosis. Seen with overlap with PT to progress mobility and for pt + therapist safety. Prior to admission, pt lives alone and has aide that assists with ADLs 3x hours daily. When aide is not there, pt is able to walk household distances in his apartment with RW and baseline 4L O2, and can prepare simple meals mod independently. Pt currently requires MIN A for bed mobility, CGA - supervision for functional STS transfers from various surface levels, and completes hallway mobility with chair follow for safety using RW, 120+ ft. Pt participates in UB/LB bathing with up to MIN A due to body habitus, grooming tasks setup including applying deodorant, and MIN A for UB dressing. Anticipate pt close to baseline performance, pt demonstrates deficits in activity tolerance, generalized weakness and impaired cardiopulmonary status.  Pt would benefit from skilled OT services to address noted impairments and functional limitations (see below for any additional details) in order to maximize safety and independence while minimizing falls risk and caregiver burden. Anticipate the need for follow up [facility] OT services upon acute hospital DC.      If plan is discharge home, recommend the following:   A little help with walking and/or transfers;A little help with bathing/dressing/bathroom;Help with stairs or ramp for entrance;Assist for transportation     Functional Status Assessment   Patient has had a recent  decline in their functional status and demonstrates the ability to make significant improvements in function in a reasonable and predictable amount of time.     Equipment Recommendations   None recommended by OT      Precautions/Restrictions   Precautions Precautions: Fall Recall of Precautions/Restrictions: Impaired Restrictions Weight Bearing Restrictions Per Provider Order: No     Mobility Bed Mobility Overal bed mobility: Needs Assistance Bed Mobility: Supine to Sit     Supine to sit: Min assist, Contact guard Sit to supine: Min assist   General bed mobility comments: uses momentum    Transfers Overall transfer level: Needs assistance Equipment used: Rolling walker (2 wheels) Transfers: Sit to/from Stand Sit to Stand: Contact guard assist, +2 safety/equipment           General transfer comment: multiple STS transfers, pt reporting fatigue and requires momentum due to body habitus      Balance Overall balance assessment: Needs assistance Sitting-balance support: Feet supported Sitting balance-Leahy Scale: Good     Standing balance support: Bilateral upper extremity supported, During functional activity, Reliant on assistive device for balance Standing balance-Leahy Scale: Fair                             ADL either performed or assessed with clinical judgement   ADL Overall ADL's : Needs assistance/impaired Eating/Feeding: Independent;Sitting   Grooming: Wash/dry face;Wash/dry hands;Applying deodorant;Standing;Set up Grooming Details (indicate cue type and reason): sitting EOB Upper Body Bathing: Minimal assistance;Sitting Upper Body Bathing Details (indicate cue type and reason): assist to wash back due to body habitus, pt performs front/arms Lower Body Bathing: Minimal assistance;Sit to/from stand Lower Body  Bathing Details (indicate cue type and reason): from STS; minA due to body habitus (pt has aide that assists). Pt puts forth good  effort and washes anterior and both thighs Upper Body Dressing : Minimal assistance;Sitting Upper Body Dressing Details (indicate cue type and reason): doff and don gown EOB level Lower Body Dressing: Maximal assistance;Sit to/from stand   Toilet Transfer: Contact guard assist;Supervision/safety;Comfort height toilet;Rolling walker (2 wheels) Toilet Transfer Details (indicate cue type and reason): bathroom level on O2 Toileting- Clothing Manipulation and Hygiene: Minimal assistance;Sit to/from stand       Functional mobility during ADLs: Supervision/safety;Contact guard assist;Cueing for sequencing;Cueing for safety;Rolling walker (2 wheels) General ADL Comments: pt participates in ADL session following mobility in hallway     Vision Baseline Vision/History: 1 Wears glasses Ability to See in Adequate Light: 0 Adequate Patient Visual Report: No change from baseline              Pertinent Vitals/Pain Pain Assessment Pain Assessment: No/denies pain     Extremity/Trunk Assessment Upper Extremity Assessment Upper Extremity Assessment: Generalized weakness;Right hand dominant   Lower Extremity Assessment Lower Extremity Assessment: Defer to PT evaluation       Communication Communication Communication: Impaired Factors Affecting Communication: Reduced clarity of speech   Cognition Arousal: Alert Behavior During Therapy: Anxious               OT - Cognition Comments: pt emotional throughout                 Following commands: Intact       Cueing  General Comments   Cueing Techniques: Verbal cues  VSS on baseline 4L O2. Pt with dyspnea on exertion but states this is his baseline           Home Living Family/patient expects to be discharged to:: Private residence Living Arrangements: Alone Available Help at Discharge: Personal care attendant Type of Home: Apartment Home Access: Level entry     Home Layout: One level     Bathroom Shower/Tub:  Chief Strategy Officer: Standard     Home Equipment: Rollator (4 wheels);Grab bars - tub/shower;Rolling Walker (2 wheels);Wheelchair - manual   Additional Comments: patient has PCA that comes 3 hours a day      Prior Functioning/Environment Prior Level of Function : Independent/Modified Independent             Mobility Comments: RW household distances ADLs Comments: aide helps with bathing, LB dressing, IADLs such as cleaning, meal prep, grocery shopping, med mgmt. When aide is not there, pt reports walking short distances in home and able to prepare sandwiches for lunch/dinner    OT Problem List: Decreased strength;Decreased range of motion;Decreased activity tolerance;Impaired balance (sitting and/or standing);Obesity;Cardiopulmonary status limiting activity   OT Treatment/Interventions: Self-care/ADL training;Therapeutic exercise;Neuromuscular education;DME and/or AE instruction;Energy conservation;Therapeutic activities;Patient/family education;Balance training      OT Goals(Current goals can be found in the care plan section)   Acute Rehab OT Goals OT Goal Formulation: With patient Time For Goal Achievement: 01/03/24 Potential to Achieve Goals: Good   OT Frequency:  Min 2X/week    Co-evaluation PT/OT/SLP Co-Evaluation/Treatment: Yes Reason for Co-Treatment: Necessary to address cognition/behavior during functional activity;For patient/therapist safety;To address functional/ADL transfers PT goals addressed during session: Mobility/safety with mobility;Balance;Proper use of DME OT goals addressed during session: ADL's and self-care      AM-PAC OT 6 Clicks Daily Activity     Outcome Measure Help from another person eating meals?: None Help  from another person taking care of personal grooming?: None Help from another person toileting, which includes using toliet, bedpan, or urinal?: A Little Help from another person bathing (including washing, rinsing,  drying)?: A Little Help from another person to put on and taking off regular upper body clothing?: A Little Help from another person to put on and taking off regular lower body clothing?: A Little 6 Click Score: 20   End of Session Equipment Utilized During Treatment: Oxygen;Rolling walker (2 wheels) Nurse Communication: Mobility status  Activity Tolerance: Patient tolerated treatment well Patient left: in bed;with call bell/phone within reach;with bed alarm set  OT Visit Diagnosis: Unsteadiness on feet (R26.81);Muscle weakness (generalized) (M62.81)                Time: 8642-8567 OT Time Calculation (min): 35 min Charges:  OT General Charges $OT Visit: 1 Visit OT Evaluation $OT Eval Low Complexity: 1 Low OT Treatments $Self Care/Home Management : 8-22 mins Elaine Middleton L. Vlada Uriostegui, OTR/L  12/20/23, 4:33 PM

## 2023-12-20 NOTE — Plan of Care (Signed)

## 2023-12-21 ENCOUNTER — Other Ambulatory Visit: Payer: Self-pay

## 2023-12-21 DIAGNOSIS — J441 Chronic obstructive pulmonary disease with (acute) exacerbation: Secondary | ICD-10-CM | POA: Diagnosis not present

## 2023-12-21 DIAGNOSIS — I5A Non-ischemic myocardial injury (non-traumatic): Secondary | ICD-10-CM | POA: Diagnosis not present

## 2023-12-21 DIAGNOSIS — J9621 Acute and chronic respiratory failure with hypoxia: Secondary | ICD-10-CM | POA: Diagnosis not present

## 2023-12-21 DIAGNOSIS — R710 Precipitous drop in hematocrit: Secondary | ICD-10-CM | POA: Diagnosis not present

## 2023-12-21 MED ORDER — PREDNISONE 20 MG PO TABS
40.0000 mg | ORAL_TABLET | Freq: Every day | ORAL | 0 refills | Status: DC
  Filled 2023-12-21: qty 4, 2d supply, fill #0

## 2023-12-21 MED ORDER — TRAZODONE HCL 100 MG PO TABS
100.0000 mg | ORAL_TABLET | Freq: Every evening | ORAL | 0 refills | Status: AC | PRN
  Filled 2023-12-21: qty 30, 30d supply, fill #0

## 2023-12-21 MED ORDER — NITROGLYCERIN 0.4 MG SL SUBL
SUBLINGUAL_TABLET | SUBLINGUAL | 0 refills | Status: AC
  Filled 2023-12-21: qty 25, 8d supply, fill #0

## 2023-12-21 MED ORDER — LACTULOSE 10 GM/15ML PO SOLN
30.0000 g | Freq: Once | ORAL | Status: AC
  Administered 2023-12-21: 30 g via ORAL
  Filled 2023-12-21: qty 60

## 2023-12-21 MED ORDER — DOXYCYCLINE HYCLATE 100 MG PO TABS
100.0000 mg | ORAL_TABLET | Freq: Two times a day (BID) | ORAL | 0 refills | Status: DC
  Filled 2023-12-21: qty 10, 5d supply, fill #0

## 2023-12-21 NOTE — TOC Transition Note (Signed)
 Transition of Care [facility]) - Discharge Note   Patient Details  Name: Christopher Mason MRN: [id number] Date of Birth: [birth year]  Transition of Care [facility]) CM/SW Contact:  Lauraine JAYSON Carpen, LCSW Phone Number: 12/21/2023, 1:29 PM   Clinical Narrative:  Patient has orders to discharge home today. [facility] accepted for PT and OT and will likely start within 48 hours. [facility] arranged for discharge home. His aide will meet him at the apartment. No further concerns. CSW signing off.   Final next level of care: Home w Home Health Services Barriers to Discharge: Barriers Resolved   Patient Goals and CMS Choice     Choice offered to / list presented to : Patient      Discharge Placement                Patient to be transferred to facility by: [facility] Name of family member notified: Aide [place] Query [phone] Patient and family notified of of transfer: 12/21/23  Discharge Plan and Services Additional resources added to the After Visit Summary for                    DME Agency: [facility] Date DME Agency Contacted: 12/20/23 Time DME Agency Contacted: 1625 Representative spoke with at DME Agency: Larraine HH Arranged: PT, OT HH Agency: [facility] Date HH Agency Contacted: 12/21/23   Representative spoke with at [facility] Agency: Shaun  Social Drivers of Health (SDOH) Interventions SDOH Screenings   Food Insecurity: Food Insecurity Present (12/19/2023)  Housing: Low Risk  (12/19/2023)  Transportation Needs: No Transportation Needs (12/19/2023)  Utilities: At Risk (12/19/2023)  Alcohol Screen: Low Risk  (09/06/2021)  Depression (PHQ2-9): Low Risk  (09/06/2021)  Financial Resource Strain: Low Risk  (10/30/2023)   Received from [facility]  Social Connections: Socially Isolated (12/19/2023)  Tobacco Use: Medium Risk (12/19/2023)     Readmission Risk Interventions    07/11/2023    8:25 PM  Readmission  Risk Prevention Plan  Transportation Screening Complete  PCP or Specialist Appt within 3-5 Days Complete  HRI or Home Care Consult Complete  Social Work Consult for Recovery Care Planning/Counseling Complete  Palliative Care Screening Not Applicable  Medication Review Oceanographer) Complete

## 2023-12-21 NOTE — Progress Notes (Signed)
 At 0520, Patient hit the call light and told the secretary he needed to speak to the nurse. RN walked into the room and asked if everything was ok or if he needed anything and the patient continued to be upset that he was woken up. RN tried to explain to patient that he is in the hospital and that hospital employees need to come into his room sometimes and do things that the doctors order throughout the night. Patient continued to be upset.Will continue to monitor.

## 2023-12-21 NOTE — Progress Notes (Signed)
 At 0510, the RN for this patient walked into the patients room with the phlebotomist in order to give the patient his thyroid  medication and so the phlebotomist can draw patients morning labs. Patient got extremely angry because he was woken up and started yelling at RN and phlebotomist. As of now, patient refused blood draw and thyroid  medication.

## 2023-12-21 NOTE — Discharge Summary (Signed)
 Physician Discharge Summary   Patient: Christopher Mason MRN: [id number] DOB: 1960/12/13  Admit date:     12/18/2023  Discharge date: 12/21/23  Discharge Physician: Charlie Patterson   PCP: Liana Fish, NP   Recommendations at discharge:   Follow-up PCP 5 days  Discharge Diagnoses: Principal Problem:   COPD exacerbation (HCC) Active Problems:   Drop in hemoglobin   Acute on chronic respiratory failure with hypoxia (HCC)   Myocardial injury   HTN (hypertension)   AKI (acute kidney injury) (HCC)   Acquired hypothyroidism   HLD (hyperlipidemia)   Bilateral leg pain   Abdominal distention   Schizophrenia (HCC)   OSA treated with BiPAP   Obesity, Class III, BMI 40-49.9 (morbid obesity)   Chest pain    Hospital Course:  63 y.o. male with medical history significant of HTN, HLD, COPD on 4L O2, former smoker, hypothyroidism, gastritis, GERD, GI bleeding, depression with anxiety, bipolar,, schizophrenia, morbid obesity, tardive dyskinesia, IBS, anemia, OSA on BiPAP, who presents with cough, SOB, abdominal distention.   Patient states that he has SOB and cough with little mucus production for several days, which has been progressively worsening.  Patient was found to have acute respiratory distress, could not speak in full sentence, oxygen desaturated to 80s on home level 4L oxygen, initially started on nonrebreather, then BiPAP, which has weaned off after improvement.  Patient was given 125 mg of Solu-Medrol  by EMS and nebulizer treatment in ED.  Patient does not have chest pain, fever, chills.  He has abdominal distention which is new for patient, but no nausea, vomiting, diarrhea or abdominal pain.  No symptoms UTI.  Patient complains of bilateral whole leg pain, denies any injury or fall.   Data reviewed independently and ED Course: pt was found to have WBC 17.3, AKI with creatinine 1.51, BUN 27 and GFR 51 (recent baseline creatinine 0.78 on 10/14/2023), troponin 52 --> 33, lactic  acid 2.3 --> 1.4, negative PCR for COVID, flu and RSV, BNP 130.  Temperature 99.5, blood pressure 130/73, heart rate 104, RR 32.  Chest x-ray negative.  Patient is admitted to PCU as inpatient.   CT scan of abdomen/pelvis: 1. Nonobstructive 4 mm right nephrolithiasis. 2. Interval increase in size of a 1.9 cm (from 1.4 cm) fluid density lesion of the left kidney likely represents a simple renal cyst. Simple renal cysts, in the absence of clinically indicated signs/symptoms, require no independent follow-up. 3.  Aortic Atherosclerosis (ICD10-I70.0).  8/27.  Hemoglobin dropped down to 7.9 this morning and repeat hemoglobin 7.5.  Patient agreeable to 1 unit of blood.  Admitted with COPD exacerbation. 8/28.  Hemoglobin up to 11.1.  Doubt GI bleed with overall response of blood and no bowel movement yet.  Patient was feeling better with regards to his breathing but then had chest pain.  Troponins negative 8/29.  Patient states that he needs to get out of here today.  Refused labs.  States he is feeling better.  Assessment and Plan: * COPD exacerbation (HCC) Gave Solu-Medrol  here and will switch over to prednisone  taper upon discharge.  Patient states he has inhalers and nebulizers at home.  Continue doxycycline .  Drop in hemoglobin Patient has underlying iron  deficiency anemia.  Patient given 1 unit of packed red blood cells and hemoglobin up to 11.1.  No signs of bleeding currently.  I am wondering if the 2 lower hemoglobins are false readings.  Patient refused further labs.  Myocardial injury Troponin only minimally elevated on admission and  last 2 troponins negative  Acute on chronic respiratory failure with hypoxia [facility]) Patient chronically on 4 L of oxygen.  HTN (hypertension) Continue amlodipine   AKI (acute kidney injury) (HCC) Resolved.  Creatinine 1.51 on presentation and down to 0.81.  Today's creatinine 1.2.  Patient refused further labs  HLD (hyperlipidemia) Continue  Lipitor  Acquired hypothyroidism Continue Synthroid   Bilateral leg pain As needed pain medication.  Abdominal distention CT scan unrevealing.  Patient states his abdomen is this size all the time..  OSA treated with BiPAP Continue BiPAP at night  Schizophrenia (HCC) Continue trazodone  hydroxyzine  and amitriptyline .  On Invega  monthly injection as outpatient.  Chest pain Could be anxiety related.  Troponin x 2 negative.  Obesity, Class III, BMI 40-49.9 (morbid obesity) BMI 43.99         Consultants: None Procedures performed: None Disposition: Home health Diet recommendation:  Regular DISCHARGE MEDICATION: Allergies as of 12/21/2023       Reactions   Aspirin Nausea And Vomiting, Swelling        Medication List     STOP taking these medications    azithromycin  250 MG tablet Commonly known as: ZITHROMAX        TAKE these medications    albuterol  108 (90 Base) MCG/ACT inhaler Commonly known as: VENTOLIN  HFA Inhale 2 puffs into the lungs every 6 (six) hours as needed for wheezing or shortness of breath.   amitriptyline  25 MG tablet Commonly known as: ELAVIL  Take 1 tablet (25 mg total) by mouth at bedtime.   amLODipine  10 MG tablet Commonly known as: NORVASC  TAKE ONE TABLET BY MOUTH EVERY DAY   atorvastatin  40 MG tablet Commonly known as: LIPITOR Take 1 tablet (40 mg total) by mouth daily.   bisacodyl  5 MG EC tablet Commonly known as: DULCOLAX Take 2 tablets (10 mg total) by mouth at bedtime.   budesonide  0.5 MG/2ML nebulizer solution Commonly known as: PULMICORT  Take 2 mLs (0.5 mg total) by nebulization 2 (two) times daily.   Colace Clear 50 MG capsule Generic drug: docusate sodium  TAKE 1 CAPSULE BY MOUTH DAILY   cyclobenzaprine  5 MG tablet Commonly known as: FLEXERIL  Take 1 tablet (5 mg total) by mouth 3 (three) times daily as needed for muscle spasms.   doxycycline  100 MG tablet Commonly known as: VIBRA -TABS Take 1 tablet (100 mg  total) by mouth every 12 (twelve) hours for 5 days.   gabapentin  300 MG capsule Commonly known as: NEURONTIN  Take 1 capsule (300 mg total) by mouth 3 (three) times daily.   hydrOXYzine  10 MG tablet Commonly known as: ATARAX  Take 1 tablet (10 mg total) by mouth 3 (three) times daily as needed for itching.   Invega  Sustenna 234 MG/1.5ML injection Generic drug: paliperidone  Inject 234 mg into the muscle as directed. Every 4 weeks   ipratropium-albuterol  0.5-2.5 (3) MG/3ML Soln Commonly known as: DUONEB Take 3 mLs by nebulization 4 (four) times daily.   levothyroxine  150 MCG tablet Commonly known as: SYNTHROID  Take 1 tablet (150 mcg total) by mouth daily before breakfast. On empty stomach, wait 30 minutes before eating or taking other medications.   multivitamin with minerals Tabs tablet Take 1 tablet by mouth daily.   nitroGLYCERIN  0.4 MG SL tablet Commonly known as: NITROSTAT  Place one tablet under tongue as needed for chest pain every 5 minutes (maximum 3)   oxyCODONE  5 MG immediate release tablet Commonly known as: Oxy IR/ROXICODONE  Take 1 tablet (5 mg total) by mouth 2 (two) times daily as needed for  severe pain (pain score 7-10).   pantoprazole  40 MG tablet Commonly known as: PROTONIX  TAKE ONE TABLET BY DAILY   polyethylene glycol 17 g packet Commonly known as: MIRALAX  / GLYCOLAX  Take 17 g by mouth daily as needed for mild constipation.   predniSONE  20 MG tablet Commonly known as: DELTASONE  Take 2 tablets (40 mg total) by mouth daily for 2 days What changed: when to take this   torsemide  20 MG tablet Commonly known as: DEMADEX  TAKE 2 TABLETS (40 MG TOTAL) BY MOUTH DAILY.   traZODone  100 MG tablet Commonly known as: DESYREL  Take 1 tablet (100 mg total) by mouth at bedtime as needed for sleep. What changed:  when to take this reasons to take this   venlafaxine  100 MG tablet Commonly known as: EFFEXOR  Take 100 mg by mouth 2 (two) times daily.   Vitamin D   (Ergocalciferol ) 1.25 MG (50000 UNIT) Caps capsule Commonly known as: DRISDOL  Take 1 capsule (50,000 Units total) by mouth every 7 (seven) days.        Follow-up Information     Abernathy, Mardy, NP Follow up in 5 day(s).   Specialty: Nurse Practitioner Contact information: [address] [place] [address] [phone]         Tamea Dedra CROME, MD Follow up in 2 week(s).   Specialty: Pulmonary Disease Contact information: [address] [place] [address] [phone]         [facility]  Follow up.   Why: They will follow up with you for your home health needs. Contact [address] [place] [address] [phone]                Discharge Exam: Filed Weights   12/18/23 1452  Weight: 127.4 kg   Physical Exam HENT:     Head: Normocephalic.  Eyes:     General: Lids are normal.     Conjunctiva/sclera: Conjunctivae normal.  Cardiovascular:     Rate and Rhythm: Normal rate and regular rhythm.     Heart sounds: Normal heart sounds, S1 normal and S2 normal.  Pulmonary:     Breath sounds: Examination of the right-lower field reveals decreased breath sounds. Examination of the left-lower field reveals decreased breath sounds. Decreased breath sounds present. No wheezing, rhonchi or rales.  Abdominal:     Palpations: Abdomen is soft.     Tenderness: There is no abdominal tenderness.  Musculoskeletal:     Right lower leg: No swelling.     Left lower leg: No swelling.  Skin:    General: Skin is warm.     Findings: No rash.  Neurological:     Mental Status: He is alert and oriented to person, place, and time.      Condition at discharge: fair  The results of significant diagnostics from this hospitalization (including imaging, microbiology, ancillary and laboratory) are listed below for reference.   Imaging Studies: CT ABDOMEN PELVIS WO CONTRAST Result Date:  12/19/2023 CLINICAL DATA:  Abdominal distention. Bowel obstruction suspected EXAM: CT ABDOMEN AND PELVIS WITHOUT CONTRAST TECHNIQUE: Multidetector CT imaging of the abdomen and pelvis was performed following the standard protocol without IV contrast. RADIATION DOSE REDUCTION: This exam was performed according to the departmental dose-optimization program which includes automated exposure control, adjustment of the mA and/or kV according to patient size and/or use of iterative reconstruction technique. COMPARISON:  PET CT 01/26/2022 FINDINGS: Lower chest: Coronary artery calcifications. Hepatobiliary: No focal liver abnormality. Status  post cholecystectomy. No biliary dilatation. Pancreas: No focal lesion. Normal pancreatic contour. No surrounding inflammatory changes. No main pancreatic ductal dilatation. Spleen: Normal in size without focal abnormality. Adrenals/Urinary Tract: No adrenal nodule bilaterally. No nephrolithiasis and no hydronephrosis. Interval increase in size of a 1.9 cm (from 1.4 cm) fluid density lesion of the left kidney likely represents a simple renal cyst. Simple renal cysts, in the absence of clinically indicated signs/symptoms, require no independent follow-up. No ureterolithiasis or hydroureter. 4 mm right nephrolithiasis. No left nephrolithiasis. No ureterolithiasis bilaterally. The urinary bladder is unremarkable. Stomach/Bowel: Stomach is within normal limits. No evidence of bowel wall thickening or dilatation. Appendix appears normal. Vascular/Lymphatic: No abdominal aorta or iliac aneurysm. Severe atherosclerotic plaque of the aorta and its branches. No abdominal, pelvic, or inguinal lymphadenopathy. Reproductive: Prostate is unremarkable. Other: No intraperitoneal free fluid. No intraperitoneal free gas. No organized fluid collection. Musculoskeletal: No abdominal wall hernia or abnormality. Right inguinal region tacks suggestive of prior hernia repair. No suspicious lytic or blastic  osseous lesions. No acute displaced fracture. Multilevel degenerative changes of the spine. IMPRESSION: 1. Nonobstructive 4 mm right nephrolithiasis. 2. Interval increase in size of a 1.9 cm (from 1.4 cm) fluid density lesion of the left kidney likely represents a simple renal cyst. Simple renal cysts, in the absence of clinically indicated signs/symptoms, require no independent follow-up. 3.  Aortic Atherosclerosis (ICD10-I70.0). Electronically Signed   By: Morgane  Naveau M.D.   On: 12/19/2023 00:08   DG Chest Port 1 View Result Date: 12/18/2023 CLINICAL DATA:  Shortness of breath. EXAM: PORTABLE CHEST 1 VIEW COMPARISON:  October 10, 2023. FINDINGS: Stable cardiomediastinal silhouette. Both lungs are clear. The visualized skeletal structures are unremarkable. IMPRESSION: No active disease. Electronically Signed   By: Lynwood Landy Raddle M.D.   On: 12/18/2023 16:17    Microbiology: Results for orders placed or performed during the hospital encounter of 12/18/23  Blood Culture (routine x 2)     Status: None (Preliminary result)   Collection Time: 12/18/23  3:00 PM   Specimen: BLOOD  Result Value Ref Range Status   Specimen Description BLOOD RIGHT ARM  Final   Special Requests   Final    BOTTLES DRAWN AEROBIC AND ANAEROBIC Blood Culture adequate volume   Culture   Final    NO GROWTH 3 DAYS Performed at [facility], [address], [place], [place] [address]    Report Status PENDING  Incomplete  Resp panel by RT-PCR (RSV, Flu A&B, Covid) Anterior Nasal Swab     Status: None   Collection Time: 12/18/23  4:14 PM   Specimen: Anterior Nasal Swab  Result Value Ref Range Status   SARS Coronavirus 2 by RT PCR NEGATIVE NEGATIVE Final    Comment: (NOTE) SARS-CoV-2 target nucleic acids are NOT DETECTED.  The SARS-CoV-2 RNA is generally detectable in upper respiratory specimens during the acute phase of infection. The lowest concentration of SARS-CoV-2 viral copies this assay can detect is 138  copies/mL. A negative result does not preclude SARS-Cov-2 infection and should not be used as the sole basis for treatment or other patient management decisions. A negative result may occur with  improper specimen collection/handling, submission of specimen other than nasopharyngeal swab, presence of viral mutation(s) within the areas targeted by this assay, and inadequate number of viral copies(<138 copies/mL). A negative result must be combined with clinical observations, patient history, and epidemiological information. The expected result is Negative.  Fact Sheet for Patients:  BloggerCourse.com  Fact Sheet  for Healthcare Providers:  SeriousBroker.it  This test is no t yet approved or cleared by the [place]  FDA and  has been authorized for detection and/or diagnosis of SARS-CoV-2 by FDA under an Emergency Use Authorization (EUA). This EUA will remain  in effect (meaning this test can be used) for the duration of the COVID-19 declaration under Section 564(b)(1) of the Act, 21 U.S.C.section 360bbb-3(b)(1), unless the authorization is terminated  or revoked sooner.       Influenza A by PCR NEGATIVE NEGATIVE Final   Influenza B by PCR NEGATIVE NEGATIVE Final    Comment: (NOTE) The Xpert Xpress SARS-CoV-2/FLU/RSV plus assay is intended as an aid in the diagnosis of influenza from Nasopharyngeal swab specimens and should not be used as a sole basis for treatment. Nasal washings and aspirates are unacceptable for Xpert Xpress SARS-CoV-2/FLU/RSV testing.  Fact Sheet for Patients: BloggerCourse.com  Fact Sheet for Healthcare Providers: SeriousBroker.it  This test is not yet approved or cleared by the [place]  FDA and has been authorized for detection and/or diagnosis of SARS-CoV-2 by FDA under an Emergency Use Authorization (EUA). This EUA will remain in effect (meaning  this test can be used) for the duration of the COVID-19 declaration under Section 564(b)(1) of the Act, 21 U.S.C. section 360bbb-3(b)(1), unless the authorization is terminated or revoked.     Resp Syncytial Virus by PCR NEGATIVE NEGATIVE Final    Comment: (NOTE) Fact Sheet for Patients: BloggerCourse.com  Fact Sheet for Healthcare Providers: SeriousBroker.it  This test is not yet approved or cleared by the [place]  FDA and has been authorized for detection and/or diagnosis of SARS-CoV-2 by FDA under an Emergency Use Authorization (EUA). This EUA will remain in effect (meaning this test can be used) for the duration of the COVID-19 declaration under Section 564(b)(1) of the Act, 21 U.S.C. section 360bbb-3(b)(1), unless the authorization is terminated or revoked.  Performed at [facility], [address] [place], [place] [address]   Blood Culture (routine x 2)     Status: None (Preliminary result)   Collection Time: 12/18/23  4:14 PM   Specimen: BLOOD  Result Value Ref Range Status   Specimen Description BLOOD BLOOD RIGHT ARM  Final   Special Requests   Final    BOTTLES DRAWN AEROBIC AND ANAEROBIC Blood Culture results may not be optimal due to an inadequate volume of blood received in culture bottles   Culture   Final    NO GROWTH 3 DAYS Performed at [facility], [address] [place], [place] [address]    Report Status PENDING  Incomplete    Labs: CBC: Recent Labs  Lab 12/18/23 1820 12/19/23 0413 12/19/23 1150 12/20/23 0434  WBC 17.3* 14.1*  --  14.1*  HGB 11.5* 7.9* 7.5* 11.1*  HCT 36.6* 26.1*  --  36.4*  MCV 93.8 96.3  --  95.8  PLT 177 195  --  175   Basic Metabolic Panel: Recent Labs  Lab 12/18/23 1500 12/18/23 1725 12/19/23 0413 12/20/23 0434  NA 138  --  139 138  K 3.7  --  3.9 3.7  CL 99  --  107 101  CO2 29  --  24 27  GLUCOSE 204*  --  217* 253*  BUN 27*  --  21  29*  CREATININE 1.51*  --  0.81 1.20  CALCIUM  9.3  --  8.7* 9.5  MG  --  2.3  --   --    Liver Function  Tests: Recent Labs  Lab 12/18/23 1500  AST 20  ALT 31  ALKPHOS 56  BILITOT 0.7  PROT 7.3  ALBUMIN  3.7   CBG: No results for input(s): GLUCAP in the last 168 hours.  Discharge time spent: greater than 30 minutes.  Signed: Charlie Patterson, MD [facility] 12/21/2023

## 2023-12-21 NOTE — Discharge Instructions (Addendum)
 Food Resources  Agency Name: [facility] Address: [address], [place], [place] [address] Phone: [phone] Website: [email or web address] Service(s) Offered: Housing services, self-sufficiency, congregate meal program, weatherization program, Event organiser program, emergency food assistance,  housing counseling, home ownership program, wheels - to work program.  [employer] free for 60 and older at various locations from [employer], Monday-Friday:  [employer], [address] [place], [phone] -[facility], [address], [place] [phone]  -[facility], [address], [place] [phone]  -[address], [address] [place], [phone]  Agency Name: [facility] Address: [phone], [place], [place] [address] Phone: [phone] Website: [email or web address] Service(s) Offered: Home delivered hot, frozen, and emergency  meals. Grocery assistance program which matches  volunteers one-on-one with seniors unable to grocery shop  for themselves. Must be 60 years and older; less than 20  hours of in-home aide service, limited or no driving ability;  live alone or with someone with a disability; live in  Industry.  Agency Name: [facility] [employer]) Address: [address] [place], [place] [address] Phone: [phone] Service(s) Offered: Food is served to shut-ins, homeless, elderly, and low income people in the community every Saturday (11:30 am-12:30 pm) and Sunday (12:30 pm-1:30pm). Volunteers also offer help and encouragement in seeking employment,  and spiritual guidance.  Agency Name: [facility] Address: [address] [place], [place] [address] Phone: [phone] Service(s) Offered: Child support services; child welfare services; food stamps; Medicaid; work first family assistance; and aid  with fuel,  rent, food and medicine.  Agency Name: [employer] Address: [address] [place], [place] Phone: [phone] Website: [email or web address] Services Offered: Monday 10:00am-12:00, 8:00pm-9:00pm, and Friday 10:00am-12:00.  Agency Name: [employer] [facility] [employer] Address: [address] [place], [place] [address] Phone: [phone] Website: [email or web address] Service(s) Offered: Serves weekday meals, open from 11:30 am- 1:00 pm., and 6:30-7:30pm, Monday-Wednesday-Friday distributes food 3:30-6pm, Monday-Wednesday-Friday.  Agency Name: [facility] Address: [address] [place], [place] Phone: [phone] Website: [email or web address] Services Offered: Distributes food the 4th Saturday of the month, starting at 8:00 am  Agency Name: [facility] Address: [phone] [address] [place], [place] [address] Phone: [phone] Website: [email or web address] Service(s) Offered: Bread of life, weekly food pantry. Open Wednesdays from 10:00am-noon.  Agency Name: [facility] [employer] Address: [address] [place], [place], [place] Phone: [phone] Services Offered: Distributes food 9am-1pm, Monday-Thursday. Call for details.  Agency Name: [facility] Address: [address], [place], [place] [address] Phone: [phone] Website: [email or web address] Service(s) Offered: Games developer. Call for assistance.  Agency Name: [facility] Address: [address], [place], [place] [address] Phone: [phone] Service Offered: Emergency Food Pantry. Call for appointment.  Agency Name: [facility] Address: [address], [place], [place] [address] Phone: [phone] Website: [email or web address] Services Offered: Games developer. Call for details  Agency Name: [facility] Address: [address] [place], [place] Phone:  [phone] Website: [email or web address] Service(s) Offered: Emergency Food Pantry. Call for details.  Agency Name: [facility] Address: [address] [place], [place] [address] Phone: [phone] or [phone] Website: [email or web address] Service(s) Offered: Distribute food 9am-11:30 am, Tuesday-Friday, and 1-3:30pm, Monday-Friday. Food pantry Monday-Friday 1pm-3pm, fresh items, Mon.-Wed.-Fri.  Agency Name: [facility] Address: [address] [place], [place] [address] Phone: [phone] Website: [email or web address] Services Offered: Distribute food Tues and Sats from 9:00am-noon.  Closed 1st Saturday of each month. Call for details  Agency Name: [employer] Address: [facility] [address] [place] [address] Phone: [phone]  Services Offered: Delivers meals every Thursday  Agency Name: [facility] Agency Address: [address], [place], [place] [address] Phone: [phone] Website: [email or web address] Service(s) Offered: Housing services, self-sufficiency, congregate meal program, and individual development account program.  Agency Name: [employer] [facility] [employer] Address: [address] [place], [place] [address] Phone: [phone] Email: [email or web address] Website: [email or web address] Service(s) Offered: Housing the homeless, feeding the hungry, Company secretary, job and education related services.  Agency Name: [facility] Address: [address], [place], [place] [address] Phone: [phone] Email: [email or web address] Service(s) Offered: Counseling, problem pregnancy, advocacy for Hispanics, limited emergency financial assistance.  Agency Name: Department [facility] Services Address: [address] [place], [place] [address] Phone: [phone] Website: [email or web address] Service(s) Offered: Child  support services; child welfare services; SNAP; Medicaid; work first family assistance; and aid with fuel,  rent, food and medicine.  Agency Name: [employer] representative Address: [address] [place], [place] [address] Phone: [phone] or [phone] Email: [email or web address] Service(s) Offered: Family services and transient assistance; emergency food, fuel, clothing, limited furniture, utilities; budget counseling, general counseling; give a kid a coat; thrift store; Christmas food and toys. Utility assistance, food pantry, rental  assistance, life sustaining medicine  Do you feel isolated?  The [facility] offers a [employer] that anyone can call toll free at [phone]. The friendship line is available 24 hours a day  [employer] is a Program of All-inclusive Care for the Elderly (PACE). Their mission is to promote and sustain the independence of seniors wishing to remain in the community. They provide seniors with comprehensive long-term health, social, medical and dietary care. Their program is a safe alternative to nursing home care. [phone]  [facility] Address [facility] [address] [place], [place] [address] Phone: [phone]. . Online zoom yoga class, connect with others without leaving your home [facility] offers [place] dance cardio sessions for individuals via Zoom. This program provides: - Dance fitness activities Please contact program for more information. Servinganyone in need adults 18+ hiv/aids individuals families Call [phone]  Email [email or web address] to get more info  [employer] offers an online [employer] to individuals where they can receive help to focus on their best health. Whether you're a [employer] member or not, the neighborhood center offers a... Main Serviceshealth education  exercise & fitness  community support services  recreation  virtual  support Other Servicessupport groups Servinganyone in need adults young adults teens seniors individuals families [email or web address] to get more info  Schedule on their website  [facility] offers an array of activities for adults age 60 and over. This program provides:- Fitness and health programs- Tech classes- Activity books Main Serviceshealth education  community support services  exercise & fitness  recreation  more education Servingseniors  Call [phone]    For more resources go online to [email or web address].[email or web address].uk and type in you zipcode

## 2023-12-21 NOTE — Assessment & Plan Note (Signed)
 Continue BiPAP at night

## 2023-12-23 LAB — CULTURE, BLOOD (ROUTINE X 2)
Culture: NO GROWTH
Culture: NO GROWTH
Special Requests: ADEQUATE

## 2023-12-25 ENCOUNTER — Telehealth: Payer: Self-pay

## 2023-12-25 NOTE — Telephone Encounter (Signed)
 Gave verbal order for physical therapy to angie [phone] for once a week for 9 weeks and also gave verbal to evaluation for nursing

## 2023-12-27 ENCOUNTER — Ambulatory Visit: Admitting: Pulmonary Disease

## 2023-12-27 ENCOUNTER — Ambulatory Visit: Admitting: Nurse Practitioner

## 2023-12-27 ENCOUNTER — Encounter: Payer: Self-pay | Admitting: Pulmonary Disease

## 2023-12-27 VITALS — BP 122/80 | HR 85 | Temp 98.2°F | Ht 67.0 in | Wt 275.8 lb

## 2023-12-27 DIAGNOSIS — J9612 Chronic respiratory failure with hypercapnia: Secondary | ICD-10-CM

## 2023-12-27 DIAGNOSIS — J449 Chronic obstructive pulmonary disease, unspecified: Secondary | ICD-10-CM | POA: Diagnosis not present

## 2023-12-27 DIAGNOSIS — Z23 Encounter for immunization: Secondary | ICD-10-CM | POA: Diagnosis not present

## 2023-12-27 DIAGNOSIS — Z09 Encounter for follow-up examination after completed treatment for conditions other than malignant neoplasm: Secondary | ICD-10-CM

## 2023-12-27 DIAGNOSIS — R5381 Other malaise: Secondary | ICD-10-CM

## 2023-12-27 DIAGNOSIS — J9611 Chronic respiratory failure with hypoxia: Secondary | ICD-10-CM | POA: Diagnosis not present

## 2023-12-27 MED ORDER — IPRATROPIUM-ALBUTEROL 0.5-2.5 (3) MG/3ML IN SOLN
3.0000 mL | Freq: Once | RESPIRATORY_TRACT | Status: AC
  Administered 2023-12-27: 3 mL via RESPIRATORY_TRACT

## 2023-12-27 NOTE — Progress Notes (Signed)
 Subjective:    Patient ID: Christopher Mason, male    DOB: 1960/12/14, 63 y.o.   MRN: [id number]  Patient Care Team: Liana Fish, NP as PCP - General (Nurse Practitioner) Tamea Dedra CROME, MD as Consulting Physician (Pulmonary Disease)  Chief Complaint  Patient presents with   COPD    Shortness of breath.     BACKGROUND/INTERVAL:This is a 63 year old former smoker (cigarettes/marijuana) with a history as noted below who presents today for follow-up on the issue of shortness of breath and COPD.  Last seen during hospitalization in March 2025.  He has missed multiple appointments for follow-up.  Admitted to [facility] from 18 December 2018 25 through 20 01 December 2023 due to COPD exacerbation.  Follows here is after that admission.  He has underlying schizophrenia.    HPI Discussed the use of AI scribe software for clinical note transcription with the patient, who gave verbal consent to proceed.  History of Present Illness   Christopher Mason is a 63 year old male with end-stage COPD and chronic respiratory failure with hypoxia who presents for follow-up.  He describes his breathing as 'not too good' and experienced an episode this morning with sweating and feeling hot, which exacerbated his dyspnea. He uses his nebulizer four times a day but missed his morning dose today.  He lives independently in an apartment and prefers to remain there. He has a dog, which he states is 'doing good'.  Ideally he should be living in assisted living or SNF due to schizophrenia.  He states that he will not go to any of these facilities.  He does have an aide that comes in daily.  He reports that he has been eating a lot.  He is gaining significant amount of weight, has massive truncal obesity.  This adds to his issues with shortness of breath.  He does not endorse any other symptomatology today.  He does wish to get a flu vaccine today.   Review of Systems A 10 point review of systems was performed  and it is as noted above otherwise negative.   Patient Active Problem List   Diagnosis Date Noted   Chest pain 12/20/2023   Drop in hemoglobin 12/19/2023   Obesity, Class III, BMI 40-49.9 (morbid obesity) 12/19/2023   COPD exacerbation (HCC) 12/18/2023   Acute on chronic respiratory failure with hypoxia (HCC) 12/18/2023   Myocardial injury 12/18/2023   OSA treated with BiPAP 12/18/2023   Bilateral leg pain 12/18/2023   Abdominal distention 12/18/2023   Back pain with history of spinal surgery 11/30/2023   Back pain 10/11/2023   Intractable back pain 10/10/2023   Essential hypertension 10/10/2023   Dyslipidemia 10/10/2023   Hypothyroidism 10/10/2023   Chronic obstructive pulmonary disease (COPD) (HCC) 10/10/2023   GERD without esophagitis 10/10/2023   Acute bilateral low back pain with bilateral sciatica 09/27/2023   Stage 3 severe COPD by GOLD classification (HCC) 07/11/2023   Personal history of poliomyelitis 05/26/2023   Iron  deficiency anemia 09/20/2022   Pneumonia of left lower lobe due to infectious organism 09/13/2022   Atelectasis of both lungs 09/08/2022   Lumbar radiculopathy 09/06/2022   Obesity (BMI 30-39.9) 09/05/2022   Constipation 09/02/2022   Anasarca 08/30/2022   Chronic respiratory failure with hypoxia and hypercapnia (HCC) 08/30/2022   Lumbar radiculopathy, acute 08/17/2022   Left leg weakness 08/16/2022   History of colon polyps    Cigarette nicotine  dependence without complication 10/31/2017   Chronic pain of left knee 10/10/2017  Lower extremity edema 10/10/2017   Recurrent left inguinal hernia 10/10/2017   Schizophrenia (HCC) 07/03/2017   HLD (hyperlipidemia) 02/07/2015   COPD, group C, by GOLD 2017 classification (HCC) 02/07/2015   AKI (acute kidney injury) (HCC) 11/20/2014   Hypokalemia 11/06/2014   Acquired hypothyroidism 09/17/2014   GERD (gastroesophageal reflux disease) 09/17/2014   Tobacco use disorder 09/17/2014   HTN (hypertension)  09/16/2014   Tardive dyskinesia 09/16/2014    Social History   Tobacco Use   Smoking status: Former    Current packs/day: 0.00    Average packs/day: 1 pack/day for 50.0 years (50.0 ttl pk-yrs)    Types: Cigarettes    Start date: 07/1972    Quit date: 07/2022    Years since quitting: 1.4   Smokeless tobacco: Never  Substance Use Topics   Alcohol use: No    Allergies  Allergen Reactions   Aspirin Nausea And Vomiting and Swelling    Current Meds  Medication Sig   albuterol  (VENTOLIN  HFA) 108 (90 Base) MCG/ACT inhaler Inhale 2 puffs into the lungs every 6 (six) hours as needed for wheezing or shortness of breath.   amitriptyline  (ELAVIL ) 25 MG tablet Take 1 tablet (25 mg total) by mouth at bedtime.   amLODipine  (NORVASC ) 10 MG tablet TAKE ONE TABLET BY MOUTH EVERY DAY   atorvastatin  (LIPITOR) 40 MG tablet Take 1 tablet (40 mg total) by mouth daily.   bisacodyl  (DULCOLAX) 5 MG EC tablet Take 2 tablets (10 mg total) by mouth at bedtime.   budesonide  (PULMICORT ) 0.5 MG/2ML nebulizer solution Take 2 mLs (0.5 mg total) by nebulization 2 (two) times daily.   COLACE CLEAR 50 MG capsule TAKE 1 CAPSULE BY MOUTH DAILY   cyclobenzaprine  (FLEXERIL ) 5 MG tablet Take 1 tablet (5 mg total) by mouth 3 (three) times daily as needed for muscle spasms.   gabapentin  (NEURONTIN ) 300 MG capsule Take 1 capsule (300 mg total) by mouth 3 (three) times daily.   hydrOXYzine  (ATARAX ) 10 MG tablet Take 1 tablet (10 mg total) by mouth 3 (three) times daily as needed for itching.   INVEGA  SUSTENNA 234 MG/1.5ML injection Inject 234 mg into the muscle as directed. Every 4 weeks   ipratropium-albuterol  (DUONEB) 0.5-2.5 (3) MG/3ML SOLN Take 3 mLs by nebulization 4 (four) times daily.   levothyroxine  (SYNTHROID ) 150 MCG tablet Take 1 tablet (150 mcg total) by mouth daily before breakfast. On empty stomach, wait 30 minutes before eating or taking other medications.   Multiple Vitamin (MULTIVITAMIN WITH MINERALS) TABS  tablet Take 1 tablet by mouth daily.   nitroGLYCERIN  (NITROSTAT ) 0.4 MG SL tablet Place one tablet under tongue as needed for chest pain every 5 minutes (maximum 3)   oxyCODONE  (OXY IR/ROXICODONE ) 5 MG immediate release tablet Take 1 tablet (5 mg total) by mouth 2 (two) times daily as needed for severe pain (pain score 7-10).   pantoprazole  (PROTONIX ) 40 MG tablet TAKE ONE TABLET BY DAILY   polyethylene glycol (MIRALAX  / GLYCOLAX ) 17 g packet Take 17 g by mouth daily as needed for mild constipation.   torsemide  (DEMADEX ) 20 MG tablet TAKE 2 TABLETS (40 MG TOTAL) BY MOUTH DAILY.   traZODone  (DESYREL ) 100 MG tablet Take 1 tablet (100 mg total) by mouth at bedtime as needed for sleep.   venlafaxine  (EFFEXOR ) 100 MG tablet Take 100 mg by mouth 2 (two) times daily.   Vitamin D , Ergocalciferol , (DRISDOL ) 1.25 MG (50000 UNIT) CAPS capsule Take 1 capsule (50,000 Units total) by mouth every  7 (seven) days.    Immunization History  Administered Date(s) Administered   Influenza Inj Mdck Quad Pf 01/07/2018, 03/02/2020, 03/01/2022   Influenza, Mdck, Trivalent,PF 6+ MOS(egg free) 02/20/2023   Influenza,inj,Quad PF,6+ Mos 01/02/2019   Moderna Sars-Covid-2 Vaccination 08/26/2019, 09/17/2019, 02/17/2020   Tdap 08/16/2022        Objective:     BP 122/80   Pulse 85   Temp 98.2 F (36.8 C) (Temporal)   Ht 5' 7 (1.702 m)   Wt 275 lb 12.8 oz (125.1 kg)   SpO2 93% Comment: 4L continous oxygen  BMI 43.20 kg/m   SpO2: 93 % (4L continous oxygen)  GENERAL: Obese gentleman, no acute distress.  Ambulatory with assistance of a walker, comfortable on nasal cannula O2.  No conversational dyspnea.  HEAD: Normocephalic, atraumatic.  EYES: Pupils equal, round, reactive to light.  No scleral icterus.  MOUTH: Macroglossia, lingual dyskinesia. NECK: Supple. No thyromegaly. Trachea midline. No JVD.  No adenopathy. PULMONARY: Good air entry bilaterally.  Coarse, significant wheezing noted  throughout. CARDIOVASCULAR: S1 and S2. Regular rate and rhythm.  ABDOMEN: Massive truncal obesity. MUSCULOSKELETAL: Left arm limited motion due to prior scar tissue (burn), no clubbing, no edema. Decreased muscle mass on the left lower extremity due to prior poliomyelitis.  Trace pitting edema of the lower extremities. NEUROLOGIC: Patient exhibits lingual tardive dyskinesia, dysarthria due to the same. SKIN: Intact,warm,dry. PSYCH: Tangential thinking.  Cooperative.  Poor insight as to current situation.  Patient received hand-held nebulizer with DuoNeb x 1: Bronchospasm resolved and shortness of breath improved.   Assessment & Plan:     ICD-10-CM   1. Stage 3 severe COPD by GOLD classification (HCC)  J44.9 ipratropium-albuterol  (DUONEB) 0.5-2.5 (3) MG/3ML nebulizer solution 3 mL    2. Chronic respiratory failure with hypoxia and hypercapnia (HCC)  J96.11    J96.12     3. Physical deconditioning  R53.81     4. Morbid obesity (HCC)  E66.01     5. Need for influenza vaccination  Z23 Flu vaccine trivalent PF, 6mos and older(Flulaval,Afluria,Fluarix,Fluzone)      Orders Placed This Encounter  Procedures   Flu vaccine trivalent PF, 6mos and older(Flulaval,Afluria,Fluarix,Fluzone)    Meds ordered this encounter  Medications   ipratropium-albuterol  (DUONEB) 0.5-2.5 (3) MG/3ML nebulizer solution 3 mL   Discussion:    Severe COPD with chronic respiratory failure with hypoxia Severe COPD with chronic respiratory failure and hypoxia. Reports poor breathing, exacerbated by sweating and heat. Denies smoking. Uses nebulizer four times a day but missed morning dose. Improved breathing after nebulizer treatment in office. No wheezing on examination after nebulizer treatment. - Administered nebulizer treatment in office x 1 - Instruct to use nebulizer without fail - Administer flu vaccine today  Obesity Obesity contributing to respiratory difficulties. Advised to reduce caloric intake to  prevent worsening of breathing issues. - Advise to watch caloric intake to manage weight   Patient received flu vaccine today.    Will see the patient in follow-up in 2 months time call sooner should any new problems arise.  Advised if symptoms do not improve or worsen, to please contact office for sooner follow up or seek emergency care.    I spent 42 minutes of dedicated to the care of this patient on the date of this encounter to include pre-visit review of records, face-to-face time with the patient discussing conditions above, post visit ordering of testing, clinical documentation with the electronic health record, making appropriate referrals as documented, and communicating  necessary findings to members of the patients care team.     C. Leita Sanders, MD Advanced Bronchoscopy PCCM [facility]    *This note was generated using voice recognition software/Dragon and/or AI transcription program.  Despite best efforts to proofread, errors can occur which can change the meaning. Any transcriptional errors that result from this process are unintentional and may not be fully corrected at the time of dictation.

## 2023-12-27 NOTE — Patient Instructions (Signed)
 VISIT SUMMARY:  You came in today for a follow-up visit regarding your end-stage COPD and chronic respiratory failure with hypoxia. You mentioned that your breathing has not been good and you had an episode this morning where you felt hot and sweaty, which made your breathing worse. You also mentioned that you missed your morning nebulizer dose today. We administered a nebulizer treatment in the office, which improved your breathing. Additionally, we discussed your weight and its impact on your breathing.  YOUR PLAN:  - SEVERE COPD WITH CHRONIC RESPIRATORY FAILURE WITH HYPOXIA: This condition means that your lungs are severely damaged and you have difficulty getting enough oxygen. You reported poor breathing, which was worse this morning due to sweating and feeling hot. You should use your nebulizer four times a day without missing any doses. We gave you a nebulizer treatment in the office today, which helped improve your breathing. You also received a flu vaccine today to help prevent respiratory infections.  -OBESITY: Being overweight can make it harder for you to breathe. We discussed the importance of reducing your caloric intake to help manage your weight and improve your breathing.  INSTRUCTIONS:  Please make sure to use your nebulizer four times a day without missing any doses. Watch your caloric intake to help manage your weight. If you experience any worsening of your symptoms, please contact our office immediately.

## 2023-12-28 ENCOUNTER — Other Ambulatory Visit: Payer: Self-pay | Admitting: Pulmonary Disease

## 2023-12-28 DIAGNOSIS — J9611 Chronic respiratory failure with hypoxia: Secondary | ICD-10-CM

## 2023-12-28 DIAGNOSIS — J449 Chronic obstructive pulmonary disease, unspecified: Secondary | ICD-10-CM

## 2024-01-07 ENCOUNTER — Telehealth: Payer: Self-pay

## 2024-01-07 DIAGNOSIS — Z9889 Other specified postprocedural states: Secondary | ICD-10-CM

## 2024-01-07 DIAGNOSIS — G8929 Other chronic pain: Secondary | ICD-10-CM

## 2024-01-09 ENCOUNTER — Telehealth: Payer: Self-pay

## 2024-01-09 MED ORDER — OXYCODONE HCL 5 MG PO TABS: 5.0000 mg | ORAL_TABLET | Freq: Two times a day (BID) | ORAL | 0 refills | Status: DC | PRN

## 2024-01-09 NOTE — Telephone Encounter (Signed)
 Pt notified for  med sent

## 2024-01-09 NOTE — Telephone Encounter (Signed)
 Spoke with robin from [facility] [phone] that pt oxygen was low last week due  74-87 and today is 11 advised her next time she can leave message we have answering service and also pt oxygen that low he should go to ED anyway and also advised pt and robin that pt can call his pulmonologist for low oxygen

## 2024-01-21 ENCOUNTER — Telehealth: Payer: Self-pay

## 2024-01-21 DIAGNOSIS — G8929 Other chronic pain: Secondary | ICD-10-CM

## 2024-01-21 DIAGNOSIS — Z9889 Other specified postprocedural states: Secondary | ICD-10-CM

## 2024-01-22 MED ORDER — OXYCODONE HCL 5 MG PO TABS: 5.0000 mg | ORAL_TABLET | Freq: Three times a day (TID) | ORAL | 0 refills | Status: DC | PRN

## 2024-01-22 NOTE — Telephone Encounter (Signed)
 Pt notified that we sent message and referral placed for pain management and also advised that Christopher Mason change  his pain medication 3 tomes a day as needed for now not take more than per day

## 2024-01-29 ENCOUNTER — Telehealth: Payer: Self-pay | Admitting: Nurse Practitioner

## 2024-01-29 NOTE — Telephone Encounter (Signed)
 Received commode order from [facility]. Gave to Alyssa for signature-Toni

## 2024-02-04 ENCOUNTER — Telehealth: Payer: Self-pay | Admitting: Nurse Practitioner

## 2024-02-04 ENCOUNTER — Telehealth: Payer: Self-pay

## 2024-02-04 NOTE — Telephone Encounter (Signed)
 commode order signed. Faxed back to [facility]; [phone]. Scanned-Toni

## 2024-02-04 NOTE — Telephone Encounter (Signed)
 Gave Esther verbal orders for 1 week 2 for OT. [phone]

## 2024-02-19 ENCOUNTER — Telehealth: Payer: Self-pay

## 2024-02-19 NOTE — Telephone Encounter (Signed)
 Gave verbal order to [facility] [phone] for occupational therapy once a week for 4 weeks

## 2024-02-21 ENCOUNTER — Ambulatory Visit: Admitting: Nurse Practitioner

## 2024-02-21 ENCOUNTER — Encounter: Payer: Self-pay | Admitting: Nurse Practitioner

## 2024-02-21 ENCOUNTER — Telehealth: Payer: Self-pay

## 2024-02-21 ENCOUNTER — Other Ambulatory Visit: Payer: Self-pay | Admitting: Nurse Practitioner

## 2024-02-21 VITALS — BP 128/80 | HR 83 | Temp 95.4°F | Resp 16 | Ht 67.0 in | Wt 275.0 lb

## 2024-02-21 DIAGNOSIS — E039 Hypothyroidism, unspecified: Secondary | ICD-10-CM | POA: Diagnosis not present

## 2024-02-21 DIAGNOSIS — M549 Dorsalgia, unspecified: Secondary | ICD-10-CM

## 2024-02-21 DIAGNOSIS — G8929 Other chronic pain: Secondary | ICD-10-CM

## 2024-02-21 DIAGNOSIS — Z9889 Other specified postprocedural states: Secondary | ICD-10-CM

## 2024-02-21 DIAGNOSIS — M5442 Lumbago with sciatica, left side: Secondary | ICD-10-CM

## 2024-02-21 DIAGNOSIS — Z1211 Encounter for screening for malignant neoplasm of colon: Secondary | ICD-10-CM

## 2024-02-21 DIAGNOSIS — J449 Chronic obstructive pulmonary disease, unspecified: Secondary | ICD-10-CM | POA: Diagnosis not present

## 2024-02-21 DIAGNOSIS — E559 Vitamin D deficiency, unspecified: Secondary | ICD-10-CM

## 2024-02-21 DIAGNOSIS — M5441 Lumbago with sciatica, right side: Secondary | ICD-10-CM

## 2024-02-21 DIAGNOSIS — E782 Mixed hyperlipidemia: Secondary | ICD-10-CM

## 2024-02-21 DIAGNOSIS — I10 Essential (primary) hypertension: Secondary | ICD-10-CM | POA: Diagnosis not present

## 2024-02-21 DIAGNOSIS — Z1212 Encounter for screening for malignant neoplasm of rectum: Secondary | ICD-10-CM

## 2024-02-21 MED ORDER — OXYCODONE HCL 5 MG PO TABS: 5.0000 mg | ORAL_TABLET | Freq: Three times a day (TID) | ORAL | 0 refills | Status: DC | PRN

## 2024-02-21 MED ORDER — VITAMIN D (ERGOCALCIFEROL) 1.25 MG (50000 UNIT) PO CAPS: 50000.0000 [IU] | ORAL_CAPSULE | ORAL | 5 refills | Status: AC

## 2024-02-21 MED ORDER — ATORVASTATIN CALCIUM 40 MG PO TABS: 40.0000 mg | ORAL_TABLET | Freq: Every day | ORAL | 3 refills | Status: AC

## 2024-02-21 MED ORDER — GABAPENTIN 300 MG PO CAPS: 300.0000 mg | ORAL_CAPSULE | Freq: Three times a day (TID) | ORAL | 5 refills | Status: DC

## 2024-02-21 MED ORDER — LEVOTHYROXINE SODIUM 150 MCG PO TABS: 150.0000 ug | ORAL_TABLET | Freq: Every day | ORAL | 3 refills | Status: AC

## 2024-02-21 MED ORDER — CYCLOBENZAPRINE HCL 5 MG PO TABS: 5.0000 mg | ORAL_TABLET | Freq: Three times a day (TID) | ORAL | 3 refills | Status: DC | PRN
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
## 2024-02-21 NOTE — Telephone Encounter (Signed)
 Gave verbal order fr Physical therapy once a week for 1 week for 8 weeks [phone]

## 2024-02-22 ENCOUNTER — Telehealth: Payer: Self-pay

## 2024-02-22 ENCOUNTER — Telehealth: Payer: Self-pay | Admitting: Nurse Practitioner

## 2024-02-22 NOTE — Telephone Encounter (Signed)
-----   Message from HILMA HASTINGS M sent at 02/21/2024  3:36 PM EDT ----- Regarding: FW: patient needs follow up appointment please Per my last note, he is not a candidate for surgery. I sent him to pain management [facility]) and looks like he did not see him.   Please call and let him know I recommend he see pain management (I can put in new referral if needed), but if he would prefer to see me again he can do that.   Thanks! ----- Message ----- From: Liana Fish, NP Sent: 02/21/2024   3:09 PM EDT To: Hastings Hilma, PA-C; [facility] Clinical Subject: patient needs follow up appointment please     Hello [facility],  I am sending this message regarding a mutual patient, Christopher Mason. He was last seen at your office in June this year. He is in need of a follow up appointment for continued severe low back pain. I have discussed in brief some of the alternative methods of pain relief such as injections and he is interested in discussing injections and other options to help relieve his pain.   Thank you for your time,  Fish Liana FNP-C [facility]

## 2024-02-22 NOTE — Telephone Encounter (Signed)
Attempted to call patient, voicemail full unable to leave message.

## 2024-02-22 NOTE — Telephone Encounter (Signed)
 Awaiting 02/21/24 office notes for GI referral-Toni

## 2024-02-25 NOTE — Telephone Encounter (Signed)
 Called patient but voicemail is full, [name] number is not in service, called Christopher Mason) [phone] and left message asking patient to call us  back

## 2024-02-27 ENCOUNTER — Encounter: Payer: Self-pay | Admitting: Nurse Practitioner

## 2024-02-27 DIAGNOSIS — E559 Vitamin D deficiency, unspecified: Secondary | ICD-10-CM | POA: Insufficient documentation

## 2024-02-29 ENCOUNTER — Telehealth: Payer: Self-pay | Admitting: Nurse Practitioner

## 2024-02-29 NOTE — Telephone Encounter (Signed)
 Gastroenterology referral sent via [facility] to [facility]

## 2024-03-03 ENCOUNTER — Ambulatory Visit: Admitting: Pulmonary Disease

## 2024-03-14 ENCOUNTER — Telehealth: Payer: Self-pay | Admitting: Nurse Practitioner

## 2024-03-14 NOTE — Telephone Encounter (Signed)
 Gastroenterology appointment 08/06/24 with [facility]-Toni

## 2024-03-31 ENCOUNTER — Encounter: Payer: Medicaid Other | Admitting: Nurse Practitioner

## 2024-04-02 ENCOUNTER — Emergency Department: Admission: EM | Admit: 2024-04-02 | Discharge: 2024-04-02 | Disposition: A | Discharge status: Home / Self Care

## 2024-04-02 ENCOUNTER — Emergency Department

## 2024-04-02 ENCOUNTER — Other Ambulatory Visit: Payer: Self-pay

## 2024-04-02 DIAGNOSIS — K769 Liver disease, unspecified: Secondary | ICD-10-CM

## 2024-04-02 DIAGNOSIS — R1031 Right lower quadrant pain: Secondary | ICD-10-CM | POA: Insufficient documentation

## 2024-04-02 DIAGNOSIS — I10 Essential (primary) hypertension: Secondary | ICD-10-CM | POA: Insufficient documentation

## 2024-04-02 DIAGNOSIS — J449 Chronic obstructive pulmonary disease, unspecified: Secondary | ICD-10-CM | POA: Insufficient documentation

## 2024-04-02 DIAGNOSIS — K7689 Other specified diseases of liver: Secondary | ICD-10-CM | POA: Diagnosis not present

## 2024-04-02 DIAGNOSIS — Z87891 Personal history of nicotine dependence: Secondary | ICD-10-CM | POA: Insufficient documentation

## 2024-04-02 DIAGNOSIS — E039 Hypothyroidism, unspecified: Secondary | ICD-10-CM | POA: Diagnosis not present

## 2024-04-02 LAB — URINALYSIS, ROUTINE W REFLEX MICROSCOPIC
Bilirubin Urine: NEGATIVE
Glucose, UA: NEGATIVE mg/dL
Hgb urine dipstick: NEGATIVE
Ketones, ur: NEGATIVE mg/dL
Leukocytes,Ua: NEGATIVE
Nitrite: NEGATIVE
Protein, ur: NEGATIVE mg/dL
Specific Gravity, Urine: 1.018 (ref 1.005–1.030)
pH: 6 (ref 5.0–8.0)

## 2024-04-02 LAB — CBC
HCT: 36.9 % — ABNORMAL LOW (ref 39.0–52.0)
Hemoglobin: 11.8 g/dL — ABNORMAL LOW (ref 13.0–17.0)
MCH: 29 pg (ref 26.0–34.0)
MCHC: 32 g/dL (ref 30.0–36.0)
MCV: 90.7 fL (ref 80.0–100.0)
Platelets: 193 K/uL (ref 150–400)
RBC: 4.07 MIL/uL — ABNORMAL LOW (ref 4.22–5.81)
RDW: 15.5 % (ref 11.5–15.5)
WBC: 8.6 K/uL (ref 4.0–10.5)
nRBC: 0 % (ref 0.0–0.2)

## 2024-04-02 LAB — COMPREHENSIVE METABOLIC PANEL WITH GFR
ALT: 18 U/L (ref 0–44)
AST: 23 U/L (ref 15–41)
Albumin: 4.1 g/dL (ref 3.5–5.0)
Alkaline Phosphatase: 111 U/L (ref 38–126)
Anion gap: 15 (ref 5–15)
BUN: 10 mg/dL (ref 8–23)
CO2: 25 mmol/L (ref 22–32)
Calcium: 9.8 mg/dL (ref 8.9–10.3)
Chloride: 98 mmol/L (ref 98–111)
Creatinine, Ser: 0.83 mg/dL (ref 0.61–1.24)
GFR, Estimated: >60 mL/min (ref >60)
Glucose, Bld: 116 mg/dL — ABNORMAL HIGH (ref 70–99)
Potassium: 3.6 mmol/L (ref 3.5–5.1)
Sodium: 137 mmol/L (ref 135–145)
Total Bilirubin: 0.2 mg/dL (ref 0.0–1.2)
Total Protein: 7.2 g/dL (ref 6.5–8.1)

## 2024-04-02 LAB — LIPASE, BLOOD: Lipase: 14 U/L (ref 11–51)

## 2024-04-02 MED ORDER — MORPHINE SULFATE (PF) 4 MG/ML IV SOLN
4.0000 mg | Freq: Once | INTRAVENOUS | Status: AC
  Administered 2024-04-02: 4 mg via INTRAVENOUS
  Filled 2024-04-02: qty 1

## 2024-04-02 MED ORDER — KETOROLAC TROMETHAMINE 15 MG/ML IJ SOLN
15.0000 mg | Freq: Once | INTRAMUSCULAR | Status: AC
  Administered 2024-04-02: 15 mg via INTRAVENOUS
  Filled 2024-04-02: qty 1

## 2024-04-02 MED ORDER — SODIUM CHLORIDE 0.9 % IV BOLUS
1000.0000 mL | Freq: Once | INTRAVENOUS | Status: AC
  Administered 2024-04-02: 1000 mL via INTRAVENOUS

## 2024-04-02 MED ORDER — ONDANSETRON HCL 4 MG/2ML IJ SOLN
4.0000 mg | Freq: Once | INTRAMUSCULAR | Status: AC
  Administered 2024-04-02: 4 mg via INTRAVENOUS
  Filled 2024-04-02: qty 2

## 2024-04-02 MED ORDER — IOHEXOL 300 MG/ML  SOLN
100.0000 mL | Freq: Once | INTRAMUSCULAR | Status: AC | PRN
  Administered 2024-04-02: 100 mL via INTRAVENOUS

## 2024-04-02 NOTE — ED Notes (Signed)
 Tolerated PO intake.

## 2024-04-02 NOTE — ED Notes (Signed)
 Provided peanut butter, crackers and ginger ale for PO challenge.

## 2024-04-02 NOTE — ED Notes (Signed)
 RN to bedside to assist pt with urinal. Pt unable to urinate at this time. Pt assisted back in bed.

## 2024-04-02 NOTE — ED Provider Notes (Signed)
 [facility] Provider Note    Event Date/Time   First MD Initiated Contact with Patient 04/02/24 1635     (approximate)   History   Flank Pain (/)   HPI  Christopher Mason is a 63 y.o. male  HTN, HLD, COPD on 4L O2, former smoker, hypothyroidism, gastritis, GERD, GI bleeding, depression with anxiety, bipolar,, schizophrenia, morbid obesity, tardive dyskinesia, IBS, anemia, OSA on BiPAP, who presents with 4 hours of right lower quadrant abdominal pain.  Patient states that he is concerned that he may have a kidney stone.  He developed stabbing sharp pain in his right lower quadrant approximately 4 hours ago and it has been ongoing.  He has urinated once since the pain onset a denies any dysuria or hematuria.  He denies any abnormalities of his penis or scrotum.  Although the triage note reports that he has flank pain he states that all of his pain is in his right lower quadrant.  He does have a prior history of cholecystectomy but still has an appendix.  Denies any recent fevers or chills cough congestion chest pain shortness of breath or changes in bowel habits.      Physical Exam   Triage Vital Signs: ED Triage Vitals  Encounter Vitals Group     BP 04/02/24 1636 (!) 151/102     Girls Systolic BP Percentile --      Girls Diastolic BP Percentile --      Boys Systolic BP Percentile --      Boys Diastolic BP Percentile --      Pulse Rate 04/02/24 1634 81     Resp 04/02/24 1634 18     Temp 04/02/24 1635 98 F (36.7 C)     Temp Source 04/02/24 1635 Oral     SpO2 04/02/24 1634 95 %     Weight --      Height --      Head Circumference --      Peak Flow --      Pain Score 04/02/24 1634 7     Pain Loc --      Pain Education --      Exclude from Growth Chart --     Most recent vital signs: Vitals:   04/02/24 1956 04/02/24 1957  BP:    Pulse: 73 75  Resp: (!) 29 18  Temp:    SpO2:  96%    Nursing Triage Note reviewed. Vital signs reviewed and  patients oxygen saturation is normoxic on his home oxygen  General: Patient is well nourished, well developed, awake and alert, resting comfortably in no acute distress Head: Normocephalic and atraumatic Eyes: Normal inspection, extraocular muscles intact, no conjunctival pallor Ear, nose, throat: Normal external exam Neck: Normal range of motion Respiratory: Patient is in no respiratory distress, lungs CTAB Cardiovascular: Patient is not tachycardic, RR GI: Abd soft, mildly tender in the right lower quadrant but nontender anywhere else including the flanks, no guarding or rebound GU: Examined with nurse chaperone in the room without any acute abnormality or erythema Neuro: The patient is alert and oriented to person, place, and time, appropriately conversive, with 5/5 bilat UE/LE strength, no gross motor or sensory defects noted. Coordination appears to be adequate. Skin: Warm, dry, and intact Psych: Anxious mood and affect, no SI or HI  ED Results / Procedures / Treatments   Labs (all labs ordered are listed, but only abnormal results are displayed) Labs Reviewed  COMPREHENSIVE METABOLIC PANEL  WITH GFR - Abnormal; Notable for the following components:      Result Value   Glucose, Bld 116 (*)    All other components within normal limits  CBC - Abnormal; Notable for the following components:   RBC 4.07 (*)    Hemoglobin 11.8 (*)    HCT 36.9 (*)    All other components within normal limits  URINALYSIS, ROUTINE W REFLEX MICROSCOPIC - Abnormal; Notable for the following components:   Color, Urine STRAW (*)    APPearance CLEAR (*)    All other components within normal limits  LIPASE, BLOOD     EKG EKG and rhythm strip are interpreted by myself:   EKG: [Normal sinus rhythm] at heart rate of 78, normal QRS duration, QTc [479, nonspecific ST segments and T waves no ectopy EKG not consistent with Acute STEMI Rhythm strip: NSR in lead II   RADIOLOGY CT abd and pelvis with iv  contrast: No acute abnormality on my independent review interpretation radiologist agrees.  Also reads as possible liver lesion of unknown significance as possible cyst versus other cause and repeat CT scan in the outpatient setting is recommended for ensuring stability    PROCEDURES:  Critical Care performed: No  Procedures   MEDICATIONS ORDERED IN ED: Medications  sodium chloride  0.9 % bolus 1,000 mL (0 mLs Intravenous Stopped 04/02/24 1953)  ondansetron  (ZOFRAN ) injection 4 mg (4 mg Intravenous Given 04/02/24 1658)  ketorolac  (TORADOL ) 15 MG/ML injection 15 mg (15 mg Intravenous Given 04/02/24 1658)  morphine  (PF) 4 MG/ML injection 4 mg (4 mg Intravenous Given 04/02/24 1658)  iohexol  (OMNIPAQUE ) 300 MG/ML solution 100 mL (100 mLs Intravenous Contrast Given 04/02/24 1753)     IMPRESSION / MDM / ASSESSMENT AND PLAN / ED COURSE                                Differential diagnosis includes, but is not limited to, nephrolithiasis pyelonephritis appendicitis colitis, diverticulitis  ED course: Patient arrives and reassured that his abdomen demonstrates no evidence of peritonitis.  He was given IV fluids, ketorolac  morphine  and Zofran  with complete resolution of his pain.  Urinalysis was not indicative of any UTI.  He had no leukocytosis no profound anemia no electrolyte derangements and no acute renal insufficiency.  His liver function tests and lipase were not elevated.  CT abdomen pelvis with IV contrast demonstrated no acute abnormalities.  I did consider whether the liver lesion could be an abscess as opposed to a cyst however patient is nontender over his level has no leukocytosis and has no infectious symptoms.  The results of his CT imaging were printed out and handed to him.  He was counseled on the need for repeat CT scan in 2 to 3 months with his primary care physician and voiced understanding.  He will return with any acutely worsening symptoms or any other  emergency.   Clinical Course as of 04/02/24 2324  Wed Apr 02, 2024  1704 WBC: 8.6 No leukocytosis [HD]  1914 Urinalysis, Routine w reflex microscopic -Urine, Clean Catch(!) No evidence of urinary tract infection [HD]  1919 Patient reassessed and feels improved.  I reviewed his CT results including no kidney stones no appendicitis but the incidental finding of a liver lesion of unclear significance.  He is nontender over his level and he has no leukocytosis so at this time I do not think his symptoms is consistent with any  abscess and more likely cyst.  He was counseled on the need for repeat CT scan in 2 to 3 months to ensure stability and he voiced understanding.  All questions answered to patient's satisfaction and he feels comfortable returning home at this time [HD]  1920 Repeat abdominal exam with no tenderness throughout soft [HD]    Clinical Course User Index [HD] Nicholaus Rolland BRAVO, MD   At time of discharge there is no evidence of acute life, limb, vision, or fertility threat. Patient has stable vital signs, pain is well controlled, patient is ambulatory and p.o. tolerant.  Discharge instructions were completed using the EPIC system. I would refer you to those at this time. All warnings prescriptions follow-up etc. were discussed in detail with the patient. Patient indicates understanding and is agreeable with this plan. All questions answered.  Patient is made aware that they may return to the emergency department for any worsening or new condition or for any other emergency.   -- Risk: 5 This patient has a high risk of morbidity due to further diagnostic testing or treatment. Rationale: This patients evaluation and management involve a high risk of morbidity due to the potential severity of presenting symptoms, need for diagnostic testing, and/or initiation of treatment that may require close monitoring. The differential includes conditions with potential for significant  deterioration or requiring escalation of care. Treatment decisions in the ED, including medication administration, procedural interventions, or disposition planning, reflect this level of risk. COPA: 5 The patient has the following acute or chronic illness/injury that poses a possible threat to life or bodily function: [X] : The patient has a potentially serious acute condition or an acute exacerbation of a chronic illness requiring urgent evaluation and management in the Emergency Department. The clinical presentation necessitates immediate consideration of life-threatening or function-threatening diagnoses, even if they are ultimately ruled out.   FINAL CLINICAL IMPRESSION(S) / ED DIAGNOSES   Final diagnoses:  Right lower quadrant abdominal pain  Liver lesion     Rx / DC Orders   ED Discharge Orders     None        Note:  This document was prepared using Dragon voice recognition software and may include unintentional dictation errors.   Nicholaus Rolland BRAVO, MD 04/02/24 [phone]

## 2024-04-02 NOTE — ED Triage Notes (Signed)
 Pt to ED via [facility] from home. Pt reports sharp and stabbing right flank pain with difficulty urinating. Hx of kidney stones. Denies N/V/D.Pt also reports upper leg pain. Pt wears 4L [place] chronically.   95% 4L [place] 111/83 98.2 oral CBG 129 HR 80

## 2024-04-02 NOTE — ED Notes (Signed)
 Called [facility] spoke with Jennifer/ Transport will arrive shortly

## 2024-04-02 NOTE — Discharge Instructions (Signed)
 You was seen in our emergency department for right lower quadrant abdominal pain.  Workup today was reassuring and you are safe to return home and continue your regular medications.  An incidental finding of a liver lesion of unclear significance was found on your CT scan.  This is not likely responsible for your symptoms today however this does need follow-up with a repeat CT scan in 2 to 3 months to ensure stability.  Please call your primary care physician and let them know.  Please return with any acutely worsening symptoms or any other emergency. -- RETURN PRECAUTIONS & AFTERCARE: (ENGLISH) RETURN PRECAUTIONS: Return immediately to the emergency department or see/call your doctor if you feel worse, weak or have changes in speech or vision, are short of breath, have fever, vomiting, pain, bleeding or dark stool, trouble urinating or any new issues. Return here or see/call your doctor if not improving as expected for your suspected condition. FOLLOW-UP CARE: Call your doctor and/or any doctors we referred you to for more advice and to make an appointment. Do this today, tomorrow or after the weekend. Some doctors only take PPO insurance so if you have HMO insurance you may want to contact your HMO or your regular doctor for referral to a specialist within your plan. Either way tell the doctor's office that it was a referral from the emergency department so you get the soonest possible appointment.  YOUR TEST RESULTS: Take result reports of any blood or urine tests, imaging tests and EKG's to your doctor and any referral doctor. Have any abnormal tests repeated. Your doctor or a referral doctor can let you know when this should be done. Also make sure your doctor contacts this hospital to get any test results that are not currently available such as cultures or special tests for infection and final imaging reports, which are often not available at the time you leave the ER but which may list additional  important findings that are not documented on the preliminary report. BLOOD PRESSURE: If your blood pressure was greater than 120/80 have your blood pressure rechecked within 1 to 2 weeks. MEDICATION SIDE EFFECTS: Do not drive, walk, bike, take the bus, etc. if you have received or are being prescribed any sedating medications such as those for pain or anxiety or certain antihistamines like Benadryl . If you have been give one of these here get a taxi home or have a friend drive you home. Ask your pharmacist to counsel you on potential side effects of any new medication

## 2024-04-08 ENCOUNTER — Other Ambulatory Visit: Payer: Self-pay

## 2024-04-08 ENCOUNTER — Emergency Department
Admission: EM | Admit: 2024-04-08 | Discharge: 2024-04-08 | Disposition: A | Discharge status: Home / Self Care | Source: Home / Self Care | Attending: Emergency Medicine | Admitting: Emergency Medicine

## 2024-04-08 ENCOUNTER — Emergency Department

## 2024-04-08 ENCOUNTER — Encounter: Payer: Self-pay | Admitting: Emergency Medicine

## 2024-04-08 DIAGNOSIS — R059 Cough, unspecified: Secondary | ICD-10-CM | POA: Diagnosis not present

## 2024-04-08 DIAGNOSIS — R0602 Shortness of breath: Secondary | ICD-10-CM | POA: Diagnosis present

## 2024-04-08 DIAGNOSIS — J441 Chronic obstructive pulmonary disease with (acute) exacerbation: Secondary | ICD-10-CM | POA: Diagnosis not present

## 2024-04-08 DIAGNOSIS — N189 Chronic kidney disease, unspecified: Secondary | ICD-10-CM | POA: Diagnosis not present

## 2024-04-08 LAB — TROPONIN T, HIGH SENSITIVITY
Troponin T High Sensitivity: 35 ng/L — ABNORMAL HIGH (ref 0–19)
Troponin T High Sensitivity: 31 ng/L — ABNORMAL HIGH (ref 0–19)

## 2024-04-08 LAB — RESP PANEL BY RT-PCR (RSV, FLU A&B, COVID)  RVPGX2
Influenza A by PCR: NEGATIVE
Influenza B by PCR: NEGATIVE
Resp Syncytial Virus by PCR: NEGATIVE
SARS Coronavirus 2 by RT PCR: NEGATIVE

## 2024-04-08 LAB — COMPREHENSIVE METABOLIC PANEL WITH GFR
ALT: 13 U/L (ref 0–44)
AST: 17 U/L (ref 15–41)
Albumin: 4.2 g/dL (ref 3.5–5.0)
Alkaline Phosphatase: 124 U/L (ref 38–126)
Anion gap: 12 (ref 5–15)
BUN: 8 mg/dL (ref 8–23)
CO2: 27 mmol/L (ref 22–32)
Calcium: 10.2 mg/dL (ref 8.9–10.3)
Chloride: 99 mmol/L (ref 98–111)
Creatinine, Ser: 0.86 mg/dL (ref 0.61–1.24)
GFR, Estimated: >60 mL/min (ref >60)
Glucose, Bld: 146 mg/dL — ABNORMAL HIGH (ref 70–99)
Potassium: 3.8 mmol/L (ref 3.5–5.1)
Sodium: 138 mmol/L (ref 135–145)
Total Bilirubin: 0.3 mg/dL (ref 0.0–1.2)
Total Protein: 7.3 g/dL (ref 6.5–8.1)

## 2024-04-08 LAB — CBC
HCT: 37.9 % — ABNORMAL LOW (ref 39.0–52.0)
Hemoglobin: 12.2 g/dL — ABNORMAL LOW (ref 13.0–17.0)
MCH: 28.6 pg (ref 26.0–34.0)
MCHC: 32.2 g/dL (ref 30.0–36.0)
MCV: 88.8 fL (ref 80.0–100.0)
Platelets: 215 K/uL (ref 150–400)
RBC: 4.27 MIL/uL (ref 4.22–5.81)
RDW: 15.5 % (ref 11.5–15.5)
WBC: 9.4 K/uL (ref 4.0–10.5)
nRBC: 0 % (ref 0.0–0.2)

## 2024-04-08 MED ORDER — IPRATROPIUM-ALBUTEROL 0.5-2.5 (3) MG/3ML IN SOLN
3.0000 mL | Freq: Once | RESPIRATORY_TRACT | Status: AC
  Administered 2024-04-08: 10:00:00 3 mL via RESPIRATORY_TRACT
  Filled 2024-04-08: qty 3

## 2024-04-08 MED ORDER — NYSTATIN 100000 UNIT/ML MT SUSP: 5.0000 mL | Freq: Three times a day (TID) | OROMUCOSAL | 0 refills | Status: AC

## 2024-04-08 MED ORDER — PREDNISONE 10 MG PO TABS: 10.0000 mg | ORAL_TABLET | Freq: Every day | ORAL | 0 refills | Status: DC

## 2024-04-08 MED ORDER — METHYLPREDNISOLONE SODIUM SUCC 125 MG IJ SOLR
125.0000 mg | Freq: Once | INTRAMUSCULAR | Status: AC
  Administered 2024-04-08: 10:00:00 125 mg via INTRAVENOUS
  Filled 2024-04-08: qty 2

## 2024-04-08 MED ORDER — OXYCODONE-ACETAMINOPHEN 5-325 MG PO TABS
2.0000 | ORAL_TABLET | Freq: Once | ORAL | Status: AC
  Administered 2024-04-08: 10:00:00 2 via ORAL
  Filled 2024-04-08: qty 2

## 2024-04-08 NOTE — ED Triage Notes (Signed)
 Pt to ED via [facility] from home for shortness of breath, sore throat, and difficulty swallowing. Pt also c/o productive cough with thick yellow sputum. Pt has hx/o COPD and CHF. Pt wears 4 liters of O2 at baseline. Pt states that he has been sick for the past few weeks but his symptoms got worse this morning and he was unable to swallow his mediations.

## 2024-04-08 NOTE — ED Provider Notes (Signed)
 [facility] Provider Note    Event Date/Time   First MD Initiated Contact with Patient 04/08/24 [phone]     (approximate)  History   Chief Complaint: Shortness of Breath and Sore Throat  HPI  Christopher Mason is a 63 y.o. male with a past medical history of CKD, COPD on 4 L chronically, hyperlipidemia, schizophrenia, presents to the emergency department for worsening shortness of breath.  According to the patient over the past 2 weeks he has been experiencing an increased cough congestion sore throat and has been having worsening shortness of breath compared to normal.  Patient denies any known fever.  As a secondary complaint patient also states left leg pain and lower back pain times many months.  Physical Exam   Triage Vital Signs: ED Triage Vitals  Encounter Vitals Group     BP 04/08/24 0933 (!) 140/92     Girls Systolic BP Percentile --      Girls Diastolic BP Percentile --      Boys Systolic BP Percentile --      Boys Diastolic BP Percentile --      Pulse Rate 04/08/24 0931 92     Resp 04/08/24 0931 20     Temp 04/08/24 0933 98.3 F (36.8 C)     Temp src --      SpO2 04/08/24 0926 94 %     Weight 04/08/24 0932 275 lb 9.2 oz (125 kg)     Height 04/08/24 0932 5' 7 (1.702 m)     Head Circumference --      Peak Flow --      Pain Score 04/08/24 0931 10     Pain Loc --      Pain Education --      Exclude from Growth Chart --     Most recent vital signs: Vitals:   04/08/24 0931 04/08/24 0933  BP:  (!) 140/92  Pulse: 92   Resp: 20   Temp:  98.3 F (36.8 C)  SpO2: 94%     General: Awake, no distress.  Mild pharyngeal erythema without exudates. CV:  Good peripheral perfusion.  Regular rate and rhythm  Resp:  Normal effort.  Equal breath sounds bilaterally.  No significant wheeze rales or rhonchi. Abd:  No distention.  Soft, nontender.  No rebound or guarding.  ED Results / Procedures / Treatments   EKG  EKG viewed and interpreted by  myself shows a normal sinus rhythm at 90 bpm with a slightly widened QRS, normal axis, normal intervals, nonspecific ST changes.  RADIOLOGY  I have reviewed and interpreted chest x-ray images.  No consolidation on my evaluation. X-ray read as minimal atelectasis.   MEDICATIONS ORDERED IN ED: Medications  ipratropium-albuterol  (DUONEB) 0.5-2.5 (3) MG/3ML nebulizer solution 3 mL (has no administration in time range)  ipratropium-albuterol  (DUONEB) 0.5-2.5 (3) MG/3ML nebulizer solution 3 mL (has no administration in time range)  methylPREDNISolone  sodium succinate (SOLU-MEDROL ) 125 mg/2 mL injection 125 mg (has no administration in time range)  oxyCODONE -acetaminophen  (PERCOCET/ROXICET) 5-325 MG per tablet 2 tablet (has no administration in time range)     IMPRESSION / MDM / ASSESSMENT AND PLAN / ED COURSE  I reviewed the triage vital signs and the nursing notes.  Patient's presentation is most consistent with acute presentation with potential threat to life or bodily function.  Patient presents emergency department with complaints of shortness of breath cough congestion over the last 2 weeks.  Overall the patient appears well,  no distress.  Currently satting in the mid upper 90s on 4 L which is his baseline.  We will check labs including a CBC chemistry and troponin we will obtain a chest x-ray, we will obtain a COVID/respiratory swab.  We will dose DuoNebs and Solu-Medrol  while awaiting further results.  Patient agreeable to plan of care.  Patient's workup is overall reassuring with a normal CBC, reassuring chemistry.  Troponin slightly elevated however largely unchanged after 2 hours.  Patient's respiratory panel is negative.  Chest x-ray is clear.  Suspect more of a COPD exacerbation however as the patient appears well satting well I believe the patient would be safe for discharge home on a prednisone  taper.  Patient agreeable to plan of care.  Given the patient's dry mouth with erythema  in the mouth we will also provide a Magic mouthwash for the patient.  Patient will follow-up with his doctor.  Patient reassured by the workup and is agreeable to this plan.  FINAL CLINICAL IMPRESSION(S) / ED DIAGNOSES   Dyspnea COPD exacerbation   Note:  This document was prepared using Dragon voice recognition software and may include unintentional dictation errors.   Dorothyann Drivers, MD 04/08/24 1401

## 2024-04-08 NOTE — ED Notes (Signed)
 [facility]  called for  transport home

## 2024-04-08 NOTE — Discharge Instructions (Signed)
 Please begin your prednisone  taper starting tomorrow 04/09/2024, take as written.  Please use your prescribed mouthwash (swish and swallow) 3 times daily for 2 weeks.  Return to the emergency department for any symptom concerning to yourself.

## 2024-04-08 NOTE — ED Notes (Signed)
High fall risk bundle in place

## 2024-04-18 ENCOUNTER — Telehealth: Payer: Self-pay | Admitting: Nurse Practitioner

## 2024-04-18 ENCOUNTER — Ambulatory Visit: Payer: Self-pay | Admitting: Nurse Practitioner

## 2024-04-18 ENCOUNTER — Other Ambulatory Visit: Payer: Self-pay | Admitting: Nurse Practitioner

## 2024-04-18 DIAGNOSIS — G8929 Other chronic pain: Secondary | ICD-10-CM

## 2024-04-18 DIAGNOSIS — J441 Chronic obstructive pulmonary disease with (acute) exacerbation: Secondary | ICD-10-CM

## 2024-04-18 DIAGNOSIS — Z9889 Other specified postprocedural states: Secondary | ICD-10-CM

## 2024-04-18 NOTE — Telephone Encounter (Signed)
 Copied from CRM #[id number]. Topic: Clinical - Medication Refill >> Apr 18, 2024 12:09 PM Jayma L wrote: Medication: mouthpiece needed for nebulizer, and the tubing is needed as well   Has the patient contacted their pharmacy? Yes (Agent: If no, request that the patient contact the pharmacy for the refill. If patient does not wish to contact the pharmacy document the reason why and proceed with request.) (Agent: If yes, when and what did the pharmacy advise?)  This is the patient's preferred pharmacy:  [facility]     Is this the correct pharmacy for this prescription? Yes If no, delete pharmacy and type the correct one.   Has the prescription been filled recently? No  Is the patient out of the medication? Yes  Has the patient been seen for an appointment in the last year OR does the patient have an upcoming appointment? Yes  Can we respond through MyChart? Yes  Agent: Please be advised that Rx refills may take up to 3 business days. We ask that you follow-up with your pharmacy.

## 2024-04-18 NOTE — Telephone Encounter (Signed)
 FYI Only or Action Required?: Action required by provider: clinical question for provider and update on patient condition.  Patient is followed in Pulmonology for COPD, last seen on 02/21/2024 by Liana Fish, NP.  Called Nurse Triage reporting Breathing Problem.  Symptoms began today.  Interventions attempted: Nebulizer treatments and Home oxygen use.  Symptoms are: unchanged.  Triage Disposition: Home Care  Patient/caregiver understands and will follow disposition?: Yes   Copied from CRM #[id number]. Topic: Clinical - Red Word Triage >> Apr 18, 2024 12:14 PM Jayma L wrote: Red Word that prompted transfer to Nurse Triage: patient has anxiety and is crying   Reason for Disposition  Home oxygen therapy, questions about  Answer Assessment - Initial Assessment Questions 1. MAIN CONCERN OR SYMPTOM: What's your main concern? (e.g., low oxygen level, breathing difficulty) What question do you have?     Pt transferred to NT from specialist after pt voiced confused and became audibly upset and inconsolable. Pt states that his O2 tubing is leaking and he is having difficulty administering his breathing tx. Discussed breathing status with pt and he denied any current distress. Discussed that pt should call DME number on O2 canisters and pt became audibly upset and crying. Pt states that he did contact company who told him he had bad debt with them. Pt voiced concerned and questioned where debt came from. Discussed I could not see any financial information. Allowed pt to decompress and express emotions. Pt able to calm down with NT. Reassured him that request for tubing processed by specialist and sent to provider but that I would also send our conversation as an update. Pt voiced appreciation.  Protocols used: Oxygen Monitoring and Hypoxia-A-AH

## 2024-04-21 NOTE — Telephone Encounter (Signed)
 DME order for tubing and mouthpiece, placed. NA. Voicemail is full. NFN.

## 2024-04-27 ENCOUNTER — Emergency Department
Admission: EM | Admit: 2024-04-27 | Discharge: 2024-04-27 | Disposition: A | Discharge status: Home / Self Care | Source: Home / Self Care

## 2024-04-27 ENCOUNTER — Emergency Department

## 2024-04-27 DIAGNOSIS — R0602 Shortness of breath: Secondary | ICD-10-CM | POA: Diagnosis present

## 2024-04-27 DIAGNOSIS — E876 Hypokalemia: Secondary | ICD-10-CM | POA: Diagnosis not present

## 2024-04-27 DIAGNOSIS — J441 Chronic obstructive pulmonary disease with (acute) exacerbation: Secondary | ICD-10-CM | POA: Insufficient documentation

## 2024-04-27 LAB — BASIC METABOLIC PANEL WITH GFR
Anion gap: 16 — ABNORMAL HIGH (ref 5–15)
BUN: 13 mg/dL (ref 8–23)
CO2: 27 mmol/L (ref 22–32)
Calcium: 9.5 mg/dL (ref 8.9–10.3)
Chloride: 98 mmol/L (ref 98–111)
Creatinine, Ser: 0.83 mg/dL (ref 0.61–1.24)
GFR, Estimated: >60 mL/min
Glucose, Bld: 173 mg/dL — ABNORMAL HIGH (ref 70–99)
Potassium: 2.9 mmol/L — ABNORMAL LOW (ref 3.5–5.1)
Sodium: 140 mmol/L (ref 135–145)

## 2024-04-27 LAB — CBC
HCT: 37.9 % — ABNORMAL LOW (ref 39.0–52.0)
Hemoglobin: 12.2 g/dL — ABNORMAL LOW (ref 13.0–17.0)
MCH: 28.6 pg (ref 26.0–34.0)
MCHC: 32.2 g/dL (ref 30.0–36.0)
MCV: 89 fL (ref 80.0–100.0)
Platelets: 179 K/uL (ref 150–400)
RBC: 4.26 MIL/uL (ref 4.22–5.81)
RDW: 15.9 % — ABNORMAL HIGH (ref 11.5–15.5)
WBC: 8.9 K/uL (ref 4.0–10.5)
nRBC: 0 % (ref 0.0–0.2)

## 2024-04-27 MED ORDER — POTASSIUM CHLORIDE CRYS ER 20 MEQ PO TBCR
40.0000 meq | EXTENDED_RELEASE_TABLET | Freq: Once | ORAL | Status: AC
  Administered 2024-04-27: 40 meq via ORAL
  Filled 2024-04-27: qty 2

## 2024-04-27 MED ORDER — PREDNISONE 20 MG PO TABS
60.0000 mg | ORAL_TABLET | Freq: Once | ORAL | Status: AC
  Administered 2024-04-27: 60 mg via ORAL
  Filled 2024-04-27: qty 3

## 2024-04-27 MED ORDER — PREDNISONE 50 MG PO TABS: 50.0000 mg | ORAL_TABLET | Freq: Every day | ORAL | 0 refills | Status: DC

## 2024-04-27 NOTE — ED Notes (Signed)
 Urine sample sent to lab with temporary label. Pt able to stand and pivot.

## 2024-04-27 NOTE — ED Provider Notes (Signed)
 "  [facility] Provider Note    Event Date/Time   First MD Initiated Contact with Patient 04/27/24 1601     (approximate)   History   Shortness of Breath   HPI  Christopher Mason is a 64 y.o. male history of COPD presenting today with concern of shortness of breath that this began about 3 weeks ago after his nebulizer machine broke.  He states since then has been having trouble getting around and even moving short distances although even at his baseline he is relatively minimal in regards to ambulation.  He has a home health aide that helps with things around the house every single day, and he requires a walker for ambulation.  He tells me that he was given 2 nebulizers and route by EMS and this has helped his work of breathing significantly, but he does complain of some generalized aches and some left leg pain which is chronic in nature and has been around for many years now and unchanged has not noticed any increased swelling in his legs.  No prior history of PEs or DVTs.  He does have an appointment with his pulmonologist on Tuesday at 2 PM.     Physical Exam   Triage Vital Signs: ED Triage Vitals  Encounter Vitals Group     BP 04/27/24 1321 113/68     Girls Systolic BP Percentile --      Girls Diastolic BP Percentile --      Boys Systolic BP Percentile --      Boys Diastolic BP Percentile --      Pulse Rate 04/27/24 1321 91     Resp 04/27/24 1321 18     Temp 04/27/24 1321 97.6 F (36.4 C)     Temp Source 04/27/24 1321 Oral     SpO2 04/27/24 1321 92 %     Weight 04/27/24 1323 275 lb 9.2 oz (125 kg)     Height 04/27/24 1323 5' 7 (1.702 m)     Head Circumference --      Peak Flow --      Pain Score 04/27/24 1322 10     Pain Loc --      Pain Education --      Exclude from Growth Chart --     Most recent vital signs: Vitals:   04/27/24 1552 04/27/24 1930  BP: 130/75 (!) 125/91  Pulse: 86 78  Resp: 18 18  Temp: 98.5 F (36.9 C) 98.3 F (36.8  C)  SpO2: 92% 96%     General: Awake, no distress.  CV:  Good peripheral perfusion.  No lower extremity swelling Resp:  Normal effort.  Clear to auscultation bilaterally, able to speak full sentences on baseline 4 L nasal cannula Abd:  No distention.  Soft nontender Other:     ED Results / Procedures / Treatments   Labs (all labs ordered are listed, but only abnormal results are displayed) Labs Reviewed  BASIC METABOLIC PANEL WITH GFR - Abnormal; Notable for the following components:      Result Value   Potassium 2.9 (*)    Glucose, Bld 173 (*)    Anion gap 16 (*)    All other components within normal limits  CBC - Abnormal; Notable for the following components:   Hemoglobin 12.2 (*)    HCT 37.9 (*)    RDW 15.9 (*)    All other components within normal limits     EKG  On my independent  interpretation appears to be sinus rhythm with rate of about 100, axis of 110, QRS appears prolonged with what appears to be a right bundle branch block, remaining intervals appear within normal limits, no obvious ischemia that I appreciate on this EKG   RADIOLOGY No acute cardiopulmonary processes appreciated  PROCEDURES:  Critical Care performed: No  Procedures   MEDICATIONS ORDERED IN ED: Medications  predniSONE  (DELTASONE ) tablet 60 mg (60 mg Oral Given 04/27/24 1640)  potassium chloride  SA (KLOR-CON  M) CR tablet 40 mEq (40 mEq Oral Given 04/27/24 1640)     IMPRESSION / MDM / ASSESSMENT AND PLAN / ED COURSE  I reviewed the triage vital signs and the nursing notes.                               Patient's presentation is most consistent with acute complicated illness / injury requiring diagnostic workup.  64 year old male history of underlying COPD presenting today with concern of shortness of breath that is been ongoing since his nebulizer broke at home.  He appears well he is not in any acute distress he did get 2 nebulizers by EMS and since then his work of breathing has  improved.  He does baseline 4 L nasal cannula, he is moving air appropriately and able to speak in full sentences does not appear to be in any acute respiratory distress.  I suspect likely a COPD exacerbation, his potassium here is slightly low so we will replenish it, I will also put him on a course of steroids.  Unfortunately multiple social issues which may be limiting to disposition, I believe reasonable for discharge home but unclear availability of nebulizer, and unclear transportation to his follow-up appointment.  I have consulted social work to help arrange to ensure that he is able to make his appointment and to see if we may be able to help replace the nebulizer at home otherwise he may be able to get this with his pulmonologist on Tuesday but we need to make sure he can make this appointment.  Suspect reasonable for discharge home.   Clinical Course as of 04/27/24 2346  [place] Apr 27, 2024  1700 Social work able to help arrange for nebulizer at home and also provided with resources to help arrange for transportation.  The patient verbalizes that his aide should be able to help with transportation on Tuesday.  We will have him discharged home at this time will provide a brief course of steroids for home.  I provided return precautions, patient verbalized understanding and is agreeable with the plan. [SK]    Clinical Course User Index [SK] Fernand Rossie HERO, MD     FINAL CLINICAL IMPRESSION(S) / ED DIAGNOSES   Final diagnoses:  COPD exacerbation [facility])     Rx / DC Orders   ED Discharge Orders          Ordered    For home use only DME Nebulizer machine        04/27/24 1652    predniSONE  (DELTASONE ) 50 MG tablet  Daily with breakfast        04/27/24 1701             Note:  This document was prepared using Dragon voice recognition software and may include unintentional dictation errors.   Fernand Rossie HERO, MD 04/27/24 [phone]  "

## 2024-04-27 NOTE — ED Notes (Signed)
 Pts caregiver called and stated she will pick pt up instead of [facility] transport.

## 2024-04-27 NOTE — ED Notes (Signed)
 Called [facility] for transport pt to home.

## 2024-04-27 NOTE — ED Triage Notes (Signed)
 Pt presents to the ED via [facility]. Pt has a hx of COPD and does at home breathing tx's, but states that his machine is broken and he has not had a breathing tx in 3 weeks and has been [facility] for same amount of time. Pt was given two albuterol  tx's with EMS. Pt is on 4L via [place] at baseline. Pt denies chest pain. Reports chronic pain in left leg and states that he is out of his oxycodone .   HR 93 138/83 98% on 4L

## 2024-04-27 NOTE — TOC Initial Note (Signed)
 Transition of Care [facility]) - Initial/Assessment Note    Patient Details  Name: Christopher Mason MRN: [id number] Date of Birth: 1960-09-27  Transition of Care [facility]) CM/SW Contact:    Nylan Nakatani L Breandan People, LCSW Phone Number: 04/27/2024, 4:56 PM  Clinical Narrative:                  Secure chat received. Patient in need of a nebulizer. Order for DME entered. [employer] notified that order is needed. CSW advised that patient has transportation issues and difficulty getting to his pulmonary appointments.   Chart reviewed. Patient has [place] Medicaid. CSW added resources for Medicaid transportation into the AV[place]. Instructions on how to schedule a ride have been added.        Patient Goals and CMS Choice            Expected Discharge Plan and Services                                              Prior Living Arrangements/Services                       Activities of Daily Living      Permission Sought/Granted                  Emotional Assessment              Admission diagnosis:  Shortness of Breath EMS Patient Active Problem List   Diagnosis Date Noted   Vitamin D  deficiency 02/27/2024   Chest pain 12/20/2023   Drop in hemoglobin 12/19/2023   Obesity, Class III, BMI 40-49.9 (morbid obesity) (HCC) 12/19/2023   COPD exacerbation (HCC) 12/18/2023   Acute on chronic respiratory failure with hypoxia (HCC) 12/18/2023   Myocardial injury 12/18/2023   OSA treated with BiPAP 12/18/2023   Bilateral leg pain 12/18/2023   Abdominal distention 12/18/2023   Back pain with history of spinal surgery 11/30/2023   Back pain 10/11/2023   Intractable back pain 10/10/2023   Essential hypertension 10/10/2023   Dyslipidemia 10/10/2023   Hypothyroidism 10/10/2023   Chronic obstructive pulmonary disease (COPD) (HCC) 10/10/2023   GERD without esophagitis 10/10/2023   Acute bilateral low back pain with bilateral sciatica 09/27/2023   Stage 3 severe COPD by GOLD  classification (HCC) 07/11/2023   Personal history of poliomyelitis 05/26/2023   Iron  deficiency anemia 09/20/2022   Atelectasis of both lungs 09/08/2022   Lumbar radiculopathy 09/06/2022   Obesity (BMI 30-39.9) 09/05/2022   Constipation 09/02/2022   Anasarca 08/30/2022   Chronic respiratory failure with hypoxia and hypercapnia (HCC) 08/30/2022   Lumbar radiculopathy, acute 08/17/2022   Left leg weakness 08/16/2022   History of colon polyps    Cigarette nicotine  dependence without complication 10/31/2017   Chronic pain of left knee 10/10/2017   Lower extremity edema 10/10/2017   Recurrent left inguinal hernia 10/10/2017   Schizophrenia (HCC) 07/03/2017   HLD (hyperlipidemia) 02/07/2015   COPD, group C, by GOLD 2017 classification (HCC) 02/07/2015   AKI (acute kidney injury) 11/20/2014   Hypokalemia 11/06/2014   Acquired hypothyroidism 09/17/2014   GERD (gastroesophageal reflux disease) 09/17/2014   Tobacco use disorder 09/17/2014   HTN (hypertension) 09/16/2014   Tardive dyskinesia 09/16/2014   PCP:  Liana Fish, NP Pharmacy:   [facility] [place], [place] - [address], [address]  [address], [address] [place] [address] Phone: [phone] Fax: [phone]  [id number] [place], [place] - [address] [facility] [address] [place] [address] [place] [address] Phone: [phone] Fax: [phone]  [facility] [address] [place] [address] Phone: [phone] Fax: [phone]     Social Drivers of Health (SDOH) Social History: SDOH Screenings   Food Insecurity: Food Insecurity Present (12/19/2023)  Housing: Low Risk (12/19/2023)  Transportation Needs: No Transportation Needs (12/19/2023)  Utilities: At Risk (12/19/2023)  Alcohol Screen: Low Risk (09/06/2021)  Depression (PHQ2-9): Low Risk (09/06/2021)  Financial Resource Strain: Low Risk (10/30/2023)    Received from [facility]  Social Connections: Socially Isolated (12/19/2023)  Tobacco Use: Medium Risk (04/27/2024)   SDOH Interventions:     Readmission Risk Interventions    07/11/2023    8:25 PM  Readmission Risk Prevention Plan  Transportation Screening Complete  PCP or Specialist Appt within 3-5 Days Complete  HRI or Home Care Consult Complete  Social Work Consult for Recovery Care Planning/Counseling Complete  Palliative Care Screening Not Applicable  Medication Review Oceanographer) Complete

## 2024-04-27 NOTE — Discharge Instructions (Addendum)
 You were seen today due to concern of difficulty breathing.  At this time I suspect this is likely from your COPD, I have written for a brief course of steroids for you to take, please take these as instructed.  We have also provided you with a nebulizer here, please use this as needed.  If you have any worsening of symptoms such as increased difficulty breathing, chest pain, or any other symptoms you find concerning please return to the emergency department immediately for further medical management.  MEDICAID TRANSPORTATION   In [place] , Medicaid provides free Non-Emergency Medical Transportation (NEMT) for covered appointments, arranged through your managed care health plan (like [place] [employer], [place]) or county DSS, covering rides in personal cars, taxis, vans, or public transit, with options for mileage reimbursement or even lodging for long trips, ensuring access to diagnosis, treatment, and follow-up care for eligible members.   How It Works  Check Your Plan: Your [place] Medicaid Health Plan (Managed Care or Tailored Plan) is responsible for arranging this service.  Request a Ride: Contact your health plan or their transportation broker (like MTM for [facility] members) to schedule a ride to a Medicaid-covered service.   Provide Notice: Give a few days' notice (e.g., 3 business days) for non-urgent trips.  Get Covered: You must have a Medicaid-covered service (like a doctor's visit, therapy) to qualify for the ride.  What's Covered Transportation Modes: Personal vehicles (mileage reimbursement), taxis, vans (including lift-equipped), mini-buses, public transit.   Longer Trips: Meals and lodging for trips 75+ miles one-way.  Other Needs (Plan Specific): Some plans (like [facility]) offer extra rides for rehab, DSS appointments, or community programs.   Who's Eligible   Most [place] Medicaid beneficiaries. You must be in an independent living situation. Not for [place] [facility] Choice or  nursing home residents.  Key Providers/Brokers   MTM: Works with [employer]. [employer]: Manages Tailored Plan transport. [employer]: Offers assistance for various needs. County DSS: Customer Service Manager offices arrange transport.  [employer] [phone] [facility] Complete Health [phone] [employer] [phone] [facility] Plan [phone] [facility] [phone] [facility] [phone] [employer] [phone] [facility] Resources [phone] [facility] Total Care [phone]

## 2024-04-28 DIAGNOSIS — J449 Chronic obstructive pulmonary disease, unspecified: Secondary | ICD-10-CM

## 2024-04-28 NOTE — Telephone Encounter (Signed)
 Copied from CRM [id number]. Topic: Clinical - Order For Equipment >> Apr 28, 2024  1:16 PM LaVerne A wrote: Reason for CRM: Patient's helper from [facility] is calling because his tubing for his CPAP had a leak and she was able to get a new one from [facility] but it was the wrong tubing.  She called again and they said they would need the whole machine in order to get the correct tubing.  She is reaching out to the office to help with this because she is not his nurse or caregiver.  Please return the call to the patient at [phone].  Thanks.

## 2024-04-28 NOTE — Telephone Encounter (Signed)
 I spoke with the patient. He is having problems getting the right tubing for his nebulizer. He is asking for a new nebulizer order to be sent to [employer].  Okay to send order for new nebulizer. Also, the patient is scheduled to see you tomorrow.

## 2024-04-29 ENCOUNTER — Ambulatory Visit: Admitting: Student in an Organized Health Care Education/Training Program

## 2024-04-29 ENCOUNTER — Encounter: Admitting: Nurse Practitioner

## 2024-04-29 ENCOUNTER — Telehealth: Payer: Self-pay

## 2024-04-29 NOTE — Telephone Encounter (Signed)
 I received a message from Dolanda with [employer] Viktoria Hawks   Pt received nebulizer machine and supplies on 05/10/2023 so insurance will not pay for another one but I can send some supplies out

## 2024-04-29 NOTE — Telephone Encounter (Signed)
 DME order placed for new nebulizer. NFN.

## 2024-04-29 NOTE — Telephone Encounter (Signed)
 I have messaged Dolanda back about the patient, asking her to please reach out to him and get him what he needs for his nebulizer to work.

## 2024-04-29 NOTE — Telephone Encounter (Signed)
 Per secure chat with Dolanda- I approved for us  to replace his nebulizer machine someone will be out today to pick up the broken one and replace and he should get the equipment today  or tomorrow    I have notified the patient.  Nothing further needed.

## 2024-04-29 NOTE — Telephone Encounter (Signed)
 I have sent an urgent message to [employer]. We ordered new Neb machine 05/09/2023 because he was using someone elses neb machine.

## 2024-04-29 NOTE — Telephone Encounter (Signed)
 I spoke with the patient. He said [employer] was needing additional records in order to process his nebulizer order. I did offer him an appt today at 1:30p but he was unable to come in today. He will keep his appt on 1/9.  Donzell can you reach out to [employer] and see what else is needed? Thank You!

## 2024-04-29 NOTE — Telephone Encounter (Signed)
 Yes please send the new nebulizer order.

## 2024-04-29 NOTE — Telephone Encounter (Signed)
 Copied from CRM [id number]. Topic: Clinical - Order For Equipment >> Apr 28, 2024  1:16 PM LaVerne A wrote: Reason for CRM: Patient's helper from [facility] is calling because his tubing for his CPAP had a leak and she was able to get a new one from [facility] but it was the wrong tubing.  She called again and they said they would need the whole machine in order to get the correct tubing.  She is reaching out to the office to help with this because she is not his nurse or caregiver.  Please return the call to the patient at [phone].  Thanks. >> Apr 29, 2024 11:12 AM Corean SAUNDERS wrote: Patient states [facility] is withholding his nebulizer machine until he has an o2 test and another test he doesn't know the name of completed. Patient states he needs this as soon as possible because's its hard to breathe without it

## 2024-05-02 ENCOUNTER — Ambulatory Visit: Admitting: Pulmonary Disease

## 2024-05-02 ENCOUNTER — Ambulatory Visit: Payer: Self-pay | Admitting: Pulmonary Disease

## 2024-05-02 NOTE — Telephone Encounter (Signed)
 This RN attempted to contact patient, no answer, unable to leave voicemail message; voicemail full. Will place in Call Back folder.

## 2024-05-02 NOTE — Telephone Encounter (Signed)
 Left message with caregiver to move up appointment. Please call office when/if they call back

## 2024-05-02 NOTE — Telephone Encounter (Signed)
 Copied from CRM #[id number]. Topic: Appointments - Appointment Scheduling >> May 02, 2024 11:09 AM Christopher Mason wrote: Christopher Mason, caregiver.... Patient only wants to see Dr Tamea. Missed appt today as no transportation. Hospital Discharge and the soonest appt I see is 2/24. Plced patient on wait list. Please call Mason @ [phone] if there is an opportunity to move appt up.

## 2024-05-02 NOTE — Telephone Encounter (Signed)
 Call disconnected, nurse will attempt to contact patient to assist with rescheduling appt.

## 2024-05-02 NOTE — Telephone Encounter (Signed)
 Called [facility], spoke with Luke regarding patient requesting to cancel appt, but call disconnected and unable to reach patient to r/s.  Kim reports okay to cancel, will follow up with patient.

## 2024-05-02 NOTE — Telephone Encounter (Signed)
 Copied from CRM #[id number]. Topic: Clinical - Red Word Triage >> May 02, 2024 10:08 AM Dedra NOVAK wrote: Red Word that prompted transfer to Nurse Triage: Patient is having trouble swallowing. Warm transfer to NT. Answer Assessment - Initial Assessment Questions 1. REASON FOR CALL: What is the main reason for your call? or How can I best help you?   Patient reports need to reschedule appt today, Caregiver just arrived and was not ready when transportation arrived; missed ride to appt.  Denies diff swallowing, reports that occurred weeks ago, only once.  Denies any diff breathing,chest pain or any symptoms at this time.  Protocols used: Information Only Call - No Triage-A-AH

## 2024-05-13 ENCOUNTER — Other Ambulatory Visit: Payer: Self-pay | Admitting: Nurse Practitioner

## 2024-05-13 DIAGNOSIS — Z9889 Other specified postprocedural states: Secondary | ICD-10-CM

## 2024-05-13 DIAGNOSIS — G8929 Other chronic pain: Secondary | ICD-10-CM

## 2024-05-14 ENCOUNTER — Ambulatory Visit: Admitting: Nurse Practitioner

## 2024-05-14 ENCOUNTER — Ambulatory Visit (INDEPENDENT_AMBULATORY_CARE_PROVIDER_SITE_OTHER): Admitting: Nurse Practitioner

## 2024-05-14 ENCOUNTER — Encounter: Admitting: Nurse Practitioner

## 2024-05-14 ENCOUNTER — Encounter: Payer: Self-pay | Admitting: Nurse Practitioner

## 2024-05-14 VITALS — BP 123/83 | HR 90 | Temp 97.2°F | Resp 16 | Ht 67.0 in

## 2024-05-14 DIAGNOSIS — M549 Dorsalgia, unspecified: Secondary | ICD-10-CM | POA: Diagnosis not present

## 2024-05-14 DIAGNOSIS — M5441 Lumbago with sciatica, right side: Secondary | ICD-10-CM | POA: Diagnosis not present

## 2024-05-14 DIAGNOSIS — M5442 Lumbago with sciatica, left side: Secondary | ICD-10-CM | POA: Diagnosis not present

## 2024-05-14 DIAGNOSIS — J9612 Chronic respiratory failure with hypercapnia: Secondary | ICD-10-CM

## 2024-05-14 DIAGNOSIS — J9611 Chronic respiratory failure with hypoxia: Secondary | ICD-10-CM | POA: Diagnosis not present

## 2024-05-14 DIAGNOSIS — M25511 Pain in right shoulder: Secondary | ICD-10-CM | POA: Diagnosis not present

## 2024-05-14 DIAGNOSIS — J449 Chronic obstructive pulmonary disease, unspecified: Secondary | ICD-10-CM | POA: Diagnosis not present

## 2024-05-14 DIAGNOSIS — Z9889 Other specified postprocedural states: Secondary | ICD-10-CM | POA: Diagnosis not present

## 2024-05-14 DIAGNOSIS — G8929 Other chronic pain: Secondary | ICD-10-CM | POA: Insufficient documentation

## 2024-05-14 MED ORDER — CYCLOBENZAPRINE HCL 5 MG PO TABS: 5.0000 mg | ORAL_TABLET | Freq: Three times a day (TID) | ORAL | 3 refills | Status: AC | PRN

## 2024-05-14 MED ORDER — GABAPENTIN 300 MG PO CAPS: 300.0000 mg | ORAL_CAPSULE | Freq: Three times a day (TID) | ORAL | 5 refills | Status: AC

## 2024-05-14 MED ORDER — OXYCODONE HCL 5 MG PO TABS: 5.0000 mg | ORAL_TABLET | Freq: Two times a day (BID) | ORAL | 0 refills | Status: AC | PRN
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
## 2024-05-15 ENCOUNTER — Telehealth: Payer: Self-pay | Admitting: Nurse Practitioner

## 2024-05-15 NOTE — Telephone Encounter (Signed)
 Urgent Orthopedic referral sent via [facility] to Dr. Krasinski w/ [facility]. Notified patient. Gave patient telephone # [phone]

## 2024-05-20 ENCOUNTER — Ambulatory Visit: Admitting: Student in an Organized Health Care Education/Training Program

## 2024-05-21 ENCOUNTER — Telehealth: Payer: Self-pay | Admitting: Nurse Practitioner

## 2024-05-21 NOTE — Telephone Encounter (Signed)
 Orthopedic appointment was 05/11/2024 with [facility]

## 2024-05-27 ENCOUNTER — Encounter: Admitting: Nurse Practitioner

## 2024-05-29 ENCOUNTER — Ambulatory Visit: Admitting: Student in an Organized Health Care Education/Training Program

## 2024-05-30 NOTE — Telephone Encounter (Signed)
 Noted. Nothing further needed.

## 2024-06-04 ENCOUNTER — Encounter: Admitting: Nurse Practitioner

## 2024-06-17 ENCOUNTER — Ambulatory Visit: Admitting: Pulmonary Disease
